# Patient Record
Sex: Female | Born: 1935
Health system: Southern US, Community
[De-identification: ages and names within clinical notes are randomized; demographics above are authoritative.]

## PROBLEM LIST (undated history)

## (undated) DIAGNOSIS — J189 Pneumonia, unspecified organism: Secondary | ICD-10-CM

## (undated) DIAGNOSIS — Z950 Presence of cardiac pacemaker: Secondary | ICD-10-CM

## (undated) DIAGNOSIS — T8859XA Other complications of anesthesia, initial encounter: Secondary | ICD-10-CM

## (undated) DIAGNOSIS — I499 Cardiac arrhythmia, unspecified: Secondary | ICD-10-CM

## (undated) DIAGNOSIS — Z9289 Personal history of other medical treatment: Secondary | ICD-10-CM

## (undated) DIAGNOSIS — Z9889 Other specified postprocedural states: Secondary | ICD-10-CM

## (undated) DIAGNOSIS — C801 Malignant (primary) neoplasm, unspecified: Secondary | ICD-10-CM

## (undated) DIAGNOSIS — Z8719 Personal history of other diseases of the digestive system: Secondary | ICD-10-CM

## (undated) DIAGNOSIS — T4145XA Adverse effect of unspecified anesthetic, initial encounter: Secondary | ICD-10-CM

## (undated) DIAGNOSIS — I4891 Unspecified atrial fibrillation: Secondary | ICD-10-CM

## (undated) DIAGNOSIS — I10 Essential (primary) hypertension: Secondary | ICD-10-CM

## (undated) DIAGNOSIS — H269 Unspecified cataract: Secondary | ICD-10-CM

## (undated) DIAGNOSIS — M1990 Unspecified osteoarthritis, unspecified site: Secondary | ICD-10-CM

## (undated) DIAGNOSIS — R0602 Shortness of breath: Secondary | ICD-10-CM

## (undated) DIAGNOSIS — R001 Bradycardia, unspecified: Secondary | ICD-10-CM

## (undated) DIAGNOSIS — Z87442 Personal history of urinary calculi: Secondary | ICD-10-CM

## (undated) DIAGNOSIS — G479 Sleep disorder, unspecified: Secondary | ICD-10-CM

## (undated) DIAGNOSIS — Z85828 Personal history of other malignant neoplasm of skin: Secondary | ICD-10-CM

## (undated) DIAGNOSIS — R112 Nausea with vomiting, unspecified: Secondary | ICD-10-CM

## (undated) DIAGNOSIS — K219 Gastro-esophageal reflux disease without esophagitis: Secondary | ICD-10-CM

## (undated) DIAGNOSIS — E785 Hyperlipidemia, unspecified: Secondary | ICD-10-CM

## (undated) DIAGNOSIS — F419 Anxiety disorder, unspecified: Secondary | ICD-10-CM

## (undated) HISTORY — PX: POLYPECTOMY: SHX149

## (undated) HISTORY — PX: PACEMAKER INSERTION: SHX728

## (undated) HISTORY — DX: Malignant (primary) neoplasm, unspecified: C80.1

## (undated) HISTORY — PX: BACK SURGERY: SHX140

## (undated) HISTORY — PX: COLONOSCOPY: SHX174

## (undated) HISTORY — PX: CHOLECYSTECTOMY: SHX55

## (undated) HISTORY — DX: Unspecified osteoarthritis, unspecified site: M19.90

## (undated) HISTORY — PX: CATARACT EXTRACTION, BILATERAL: SHX1313

## (undated) HISTORY — DX: Gastro-esophageal reflux disease without esophagitis: K21.9

## (undated) HISTORY — DX: Essential (primary) hypertension: I10

## (undated) HISTORY — DX: Unspecified atrial fibrillation: I48.91

## (undated) HISTORY — PX: UPPER GASTROINTESTINAL ENDOSCOPY: SHX188

## (undated) HISTORY — DX: Pneumonia, unspecified organism: J18.9

## (undated) HISTORY — DX: Hyperlipidemia, unspecified: E78.5

## (undated) HISTORY — DX: Bradycardia, unspecified: R00.1

## (undated) HISTORY — DX: Unspecified cataract: H26.9

---

## 1958-03-29 HISTORY — PX: APPENDECTOMY: SHX54

## 1967-03-30 HISTORY — PX: TOTAL ABDOMINAL HYSTERECTOMY: SHX209

## 1992-03-29 HISTORY — PX: ARTHROPLASTY: SHX135

## 1998-01-28 ENCOUNTER — Ambulatory Visit (HOSPITAL_COMMUNITY): Admission: RE | Admit: 1998-01-28 | Discharge: 1998-01-28 | Payer: Self-pay | Admitting: Gastroenterology

## 1998-03-29 HISTORY — PX: ARTHROPLASTY: SHX135

## 1999-04-06 ENCOUNTER — Encounter: Admission: RE | Admit: 1999-04-06 | Discharge: 1999-04-06 | Payer: Self-pay | Admitting: *Deleted

## 1999-04-06 ENCOUNTER — Encounter: Payer: Self-pay | Admitting: *Deleted

## 1999-06-02 ENCOUNTER — Encounter: Payer: Self-pay | Admitting: *Deleted

## 1999-06-02 ENCOUNTER — Encounter: Admission: RE | Admit: 1999-06-02 | Discharge: 1999-06-02 | Payer: Self-pay | Admitting: *Deleted

## 1999-10-01 ENCOUNTER — Encounter: Payer: Self-pay | Admitting: *Deleted

## 1999-10-01 ENCOUNTER — Encounter: Admission: RE | Admit: 1999-10-01 | Discharge: 1999-10-01 | Payer: Self-pay | Admitting: *Deleted

## 2000-03-29 HISTORY — PX: DG SELECTED HSG GDC ONLY: HXRAD357

## 2000-05-16 ENCOUNTER — Encounter: Payer: Self-pay | Admitting: Orthopedic Surgery

## 2000-05-23 ENCOUNTER — Inpatient Hospital Stay (HOSPITAL_COMMUNITY): Admission: RE | Admit: 2000-05-23 | Discharge: 2000-05-28 | Payer: Self-pay | Admitting: Orthopedic Surgery

## 2000-05-27 ENCOUNTER — Encounter: Payer: Self-pay | Admitting: Orthopedic Surgery

## 2000-10-06 ENCOUNTER — Encounter: Admission: RE | Admit: 2000-10-06 | Discharge: 2000-10-06 | Payer: Self-pay | Admitting: *Deleted

## 2000-10-11 ENCOUNTER — Encounter: Admission: RE | Admit: 2000-10-11 | Discharge: 2000-10-11 | Payer: Self-pay | Admitting: *Deleted

## 2001-01-10 ENCOUNTER — Encounter: Admission: RE | Admit: 2001-01-10 | Discharge: 2001-01-10 | Payer: Self-pay | Admitting: *Deleted

## 2001-03-20 ENCOUNTER — Ambulatory Visit (HOSPITAL_COMMUNITY): Admission: RE | Admit: 2001-03-20 | Discharge: 2001-03-20 | Payer: Self-pay | Admitting: *Deleted

## 2001-10-23 ENCOUNTER — Encounter: Admission: RE | Admit: 2001-10-23 | Discharge: 2001-10-23 | Payer: Self-pay | Admitting: Internal Medicine

## 2001-10-23 ENCOUNTER — Encounter: Payer: Self-pay | Admitting: Internal Medicine

## 2002-09-10 ENCOUNTER — Ambulatory Visit (HOSPITAL_COMMUNITY): Admission: RE | Admit: 2002-09-10 | Discharge: 2002-09-10 | Payer: Self-pay | Admitting: *Deleted

## 2002-10-31 ENCOUNTER — Encounter: Admission: RE | Admit: 2002-10-31 | Discharge: 2002-10-31 | Payer: Self-pay | Admitting: Internal Medicine

## 2002-10-31 ENCOUNTER — Encounter: Payer: Self-pay | Admitting: Internal Medicine

## 2003-11-08 ENCOUNTER — Encounter: Admission: RE | Admit: 2003-11-08 | Discharge: 2003-11-08 | Payer: Self-pay | Admitting: Internal Medicine

## 2004-03-31 ENCOUNTER — Emergency Department (HOSPITAL_COMMUNITY): Admission: EM | Admit: 2004-03-31 | Discharge: 2004-03-31 | Payer: Self-pay | Admitting: Emergency Medicine

## 2004-04-05 ENCOUNTER — Encounter: Admission: RE | Admit: 2004-04-05 | Discharge: 2004-04-05 | Payer: Self-pay | Admitting: Specialist

## 2004-04-23 ENCOUNTER — Encounter: Admission: RE | Admit: 2004-04-23 | Discharge: 2004-04-23 | Payer: Self-pay | Admitting: Specialist

## 2004-05-08 ENCOUNTER — Encounter: Admission: RE | Admit: 2004-05-08 | Discharge: 2004-05-08 | Payer: Self-pay | Admitting: Specialist

## 2004-05-27 HISTORY — PX: OTHER SURGICAL HISTORY: SHX169

## 2004-06-12 ENCOUNTER — Encounter (INDEPENDENT_AMBULATORY_CARE_PROVIDER_SITE_OTHER): Payer: Self-pay | Admitting: *Deleted

## 2004-06-12 ENCOUNTER — Observation Stay (HOSPITAL_COMMUNITY): Admission: RE | Admit: 2004-06-12 | Discharge: 2004-06-13 | Payer: Self-pay | Admitting: Specialist

## 2004-12-14 ENCOUNTER — Encounter: Admission: RE | Admit: 2004-12-14 | Discharge: 2004-12-14 | Payer: Self-pay | Admitting: Internal Medicine

## 2005-07-09 ENCOUNTER — Ambulatory Visit: Payer: Self-pay | Admitting: *Deleted

## 2005-07-09 ENCOUNTER — Encounter: Payer: Self-pay | Admitting: Emergency Medicine

## 2005-07-10 ENCOUNTER — Inpatient Hospital Stay (HOSPITAL_COMMUNITY): Admission: AD | Admit: 2005-07-10 | Discharge: 2005-07-11 | Payer: Self-pay | Admitting: Internal Medicine

## 2005-07-15 ENCOUNTER — Ambulatory Visit: Payer: Self-pay | Admitting: Cardiology

## 2005-07-16 ENCOUNTER — Ambulatory Visit: Payer: Self-pay | Admitting: Internal Medicine

## 2005-07-19 ENCOUNTER — Ambulatory Visit: Payer: Self-pay | Admitting: Internal Medicine

## 2005-07-23 ENCOUNTER — Ambulatory Visit: Payer: Self-pay | Admitting: Cardiology

## 2005-07-27 ENCOUNTER — Ambulatory Visit (HOSPITAL_COMMUNITY): Admission: RE | Admit: 2005-07-27 | Discharge: 2005-07-28 | Payer: Self-pay | Admitting: Internal Medicine

## 2005-07-27 ENCOUNTER — Ambulatory Visit: Payer: Self-pay | Admitting: Internal Medicine

## 2005-08-11 ENCOUNTER — Ambulatory Visit: Payer: Self-pay

## 2005-11-16 ENCOUNTER — Ambulatory Visit: Payer: Self-pay | Admitting: Internal Medicine

## 2005-12-15 ENCOUNTER — Encounter: Admission: RE | Admit: 2005-12-15 | Discharge: 2005-12-15 | Payer: Self-pay | Admitting: Internal Medicine

## 2006-03-23 ENCOUNTER — Inpatient Hospital Stay (HOSPITAL_COMMUNITY): Admission: EM | Admit: 2006-03-23 | Discharge: 2006-03-28 | Payer: Self-pay | Admitting: Emergency Medicine

## 2006-03-25 ENCOUNTER — Encounter (INDEPENDENT_AMBULATORY_CARE_PROVIDER_SITE_OTHER): Payer: Self-pay | Admitting: Specialist

## 2006-08-16 ENCOUNTER — Ambulatory Visit: Payer: Self-pay | Admitting: Internal Medicine

## 2006-09-26 ENCOUNTER — Ambulatory Visit: Payer: Self-pay | Admitting: Internal Medicine

## 2006-11-21 ENCOUNTER — Ambulatory Visit: Payer: Self-pay | Admitting: Internal Medicine

## 2006-12-20 ENCOUNTER — Encounter: Admission: RE | Admit: 2006-12-20 | Discharge: 2006-12-20 | Payer: Self-pay | Admitting: Internal Medicine

## 2007-01-16 ENCOUNTER — Ambulatory Visit: Payer: Self-pay | Admitting: Internal Medicine

## 2007-02-10 ENCOUNTER — Encounter: Admission: RE | Admit: 2007-02-10 | Discharge: 2007-02-10 | Payer: Self-pay | Admitting: Internal Medicine

## 2007-03-13 ENCOUNTER — Ambulatory Visit: Payer: Self-pay | Admitting: Internal Medicine

## 2007-04-13 ENCOUNTER — Ambulatory Visit: Payer: Self-pay | Admitting: Internal Medicine

## 2007-07-13 ENCOUNTER — Ambulatory Visit: Payer: Self-pay | Admitting: Internal Medicine

## 2007-08-28 ENCOUNTER — Ambulatory Visit: Payer: Self-pay | Admitting: Internal Medicine

## 2007-10-12 ENCOUNTER — Ambulatory Visit: Payer: Self-pay | Admitting: Internal Medicine

## 2007-10-18 ENCOUNTER — Encounter: Admission: RE | Admit: 2007-10-18 | Discharge: 2007-10-18 | Payer: Self-pay | Admitting: Internal Medicine

## 2007-12-28 ENCOUNTER — Encounter: Admission: RE | Admit: 2007-12-28 | Discharge: 2007-12-28 | Payer: Self-pay | Admitting: Internal Medicine

## 2008-01-11 ENCOUNTER — Ambulatory Visit: Payer: Self-pay | Admitting: Internal Medicine

## 2008-04-11 ENCOUNTER — Ambulatory Visit: Payer: Self-pay | Admitting: Internal Medicine

## 2008-07-11 ENCOUNTER — Ambulatory Visit: Payer: Self-pay | Admitting: Internal Medicine

## 2008-07-19 ENCOUNTER — Encounter (INDEPENDENT_AMBULATORY_CARE_PROVIDER_SITE_OTHER): Payer: Self-pay | Admitting: Radiology

## 2008-08-21 DIAGNOSIS — M1990 Unspecified osteoarthritis, unspecified site: Secondary | ICD-10-CM | POA: Insufficient documentation

## 2008-08-21 DIAGNOSIS — R001 Bradycardia, unspecified: Secondary | ICD-10-CM

## 2008-08-21 DIAGNOSIS — K219 Gastro-esophageal reflux disease without esophagitis: Secondary | ICD-10-CM

## 2008-08-21 DIAGNOSIS — I10 Essential (primary) hypertension: Secondary | ICD-10-CM

## 2008-08-21 DIAGNOSIS — J189 Pneumonia, unspecified organism: Secondary | ICD-10-CM

## 2008-08-21 DIAGNOSIS — I4891 Unspecified atrial fibrillation: Secondary | ICD-10-CM

## 2008-08-21 DIAGNOSIS — N200 Calculus of kidney: Secondary | ICD-10-CM

## 2008-08-29 ENCOUNTER — Ambulatory Visit: Payer: Self-pay | Admitting: Internal Medicine

## 2008-08-29 DIAGNOSIS — Z950 Presence of cardiac pacemaker: Secondary | ICD-10-CM

## 2008-10-17 ENCOUNTER — Ambulatory Visit: Payer: Self-pay | Admitting: Internal Medicine

## 2008-12-31 ENCOUNTER — Encounter: Admission: RE | Admit: 2008-12-31 | Discharge: 2008-12-31 | Payer: Self-pay | Admitting: Internal Medicine

## 2009-01-16 ENCOUNTER — Ambulatory Visit: Payer: Self-pay | Admitting: Internal Medicine

## 2009-04-17 ENCOUNTER — Ambulatory Visit: Payer: Self-pay | Admitting: Internal Medicine

## 2009-07-17 ENCOUNTER — Ambulatory Visit: Payer: Self-pay | Admitting: Internal Medicine

## 2009-08-27 ENCOUNTER — Ambulatory Visit: Payer: Self-pay | Admitting: Internal Medicine

## 2009-08-28 ENCOUNTER — Encounter: Payer: Self-pay | Admitting: Internal Medicine

## 2009-10-16 ENCOUNTER — Encounter: Payer: Self-pay | Admitting: Internal Medicine

## 2009-10-24 ENCOUNTER — Ambulatory Visit: Payer: Self-pay | Admitting: Internal Medicine

## 2010-01-15 ENCOUNTER — Ambulatory Visit: Payer: Self-pay | Admitting: Internal Medicine

## 2010-04-16 ENCOUNTER — Encounter: Payer: Self-pay | Admitting: Internal Medicine

## 2010-04-20 ENCOUNTER — Encounter
Admission: RE | Admit: 2010-04-20 | Discharge: 2010-04-20 | Payer: Self-pay | Source: Home / Self Care | Attending: Internal Medicine | Admitting: Internal Medicine

## 2010-04-23 ENCOUNTER — Ambulatory Visit: Payer: Self-pay | Admitting: Internal Medicine

## 2010-04-28 NOTE — Cardiovascular Report (Signed)
Summary: TTM   TTM   Imported By: Roderic Ovens 10/28/2009 14:35:19  _____________________________________________________________________  External Attachment:    Type:   Image     Comment:   External Document

## 2010-04-28 NOTE — Cardiovascular Report (Signed)
Summary: TTM   TTM   Imported By: Roderic Ovens 10/28/2009 14:34:37  _____________________________________________________________________  External Attachment:    Type:   Image     Comment:   External Document

## 2010-04-28 NOTE — Cardiovascular Report (Signed)
Summary: TTM   TTM   Imported By: Roderic Ovens 08/21/2009 11:39:08  _____________________________________________________________________  External Attachment:    Type:   Image     Comment:   External Document

## 2010-04-28 NOTE — Assessment & Plan Note (Signed)
Summary: 1 year rov   Visit Type:  Follow-up Primary Provider:  Dr Wylene Simmer  CC:  pt stumbled  and fell while she was at [employer] otherwise she is doing ok.  History of Present Illness: Ms. Rentz returns today for followup.  She is an elderly woman with a h/o atrial fibrillation and symptomatic bradycardia who is s/p PPM.  She has done well.  She denies c/p or sob.  No peripheral edema.  She does admit to falling several weeks ago.  She states that she tripped and her face struck the concrete.  She admits to being sedentary.  No other complaints at this time.  Current Medications (verified): 1)  Micardis 80 Mg Tabs (Telmisartan) .... Take One Tablet By Mouth Daily 2)  Coumadin 2 Mg Tabs (Warfarin Sodium) .... As Directed 3)  Potassium Chloride Crys Cr 20 Meq Cr-Tabs (Potassium Chloride Crys Cr) .... Take One Tablet By Mouth Daily 4)  Amlodipine Besylate 5 Mg Tabs (Amlodipine Besylate) .... Take One Tablet By Mouth Daily 5)  Pravastatin Sodium 40 Mg Tabs (Pravastatin Sodium) .... Take One Tablet By Mouth Daily At Bedtime 6)  Hydrochlorothiazide 25 Mg Tabs (Hydrochlorothiazide) .... Take One Tablet By Mouth Daily.  Allergies (verified): No Known Drug Allergies  Past History:  Past Medical History: Last updated: 08/21/2008 GERD (ICD-530.81) NEPHROLITHIASIS (ICD-592.0) PNEUMONIA (ICD-486) OSTEOARTHRITIS (ICD-715.90) HYPERTENSION (ICD-401.9) AUTOMATIC IMPLANTABLE CARDIAC DEFIBRILLATOR SITU (ICD-V45.02) ATRIAL FIBRILLATION (ICD-427.31) BRADYCARDIA (ICD-427.89)  Past Surgical History: Last updated: 08/21/2008  March of 2006, she had a left L4-L5 microdiskectomy.  Left total knee arthroplasty in 1994 and a right total knee arthroplasty  in about the year 2000. GD in 2002 with a dilation  Appendectomy in 1960. Total abdominal hysterectomy in 1969.  Review of Systems  The patient denies chest pain, syncope, dyspnea on exertion, and peripheral edema.    Vital  Signs:  Patient profile:   77 year old female Height:      64 inches Weight:      157 pounds BMI:     27.05 Pulse rate:   61 / minute BP sitting:   130 / 76  (left arm) Cuff size:   regular  Vitals Entered By: Burnett Kanaris, CNA (August 27, 2009 11:44 AM)  Physical Exam  General:  Well developed, well nourished, in no acute distress. Head:  normocephalic and atraumatic Eyes:  PERRLA/EOM intact; conjunctiva and lids normal. Mouth:  Teeth, gums and palate normal. Oral mucosa normal. Neck:  Neck supple, no JVD. No masses, thyromegaly or abnormal cervical nodes. Chest Wall:  Well healed PPM incisio. Lungs:  Clear bilaterally to auscultation with no wheezes or rhonchi. Heart:  IRIR with normal S1 and S2.  PMI is not enlarged or laterally displaced. Abdomen:  Bowel sounds positive; abdomen soft and non-tender without masses, organomegaly, or hernias noted. No hepatosplenomegaly. Msk:  Back normal, normal gait. Muscle strength and tone normal. Pulses:  pulses normal in all 4 extremities Extremities:  No clubbing or cyanosis. Neurologic:  Alert and oriented x 3.   PPM Specifications Following MD:  Lewayne Bunting, MD     PPM Vendor:  [employer] Jude     PPM Model Number:  [phone] [id number]     PPM Serial Number:  [id number] PPM DOI:  07/27/2005     PPM Implanting MD:  Lewayne Bunting, MD  Lead 1    Location: RV     DOI: 07/27/2005     Model #: [id number]     Serial #:  [id number]     Status: active   Indications:  T/B SYNDROME   PPM Follow Up Remote Check?  No Battery Voltage:  2.79 V     Battery Est. Longevity:  10 YEARS     Pacer Dependent:  No     Right Ventricle  Amplitude: 16.6 mV, Impedance: 384 ohms, Threshold: 0.875 V at 0.4 msec  Episodes Ventricular High Rate:  0     Ventricular Pacing:  62%  Parameters Mode:  VVIR     Lower Rate Limit:  60     Upper Rate Limit:  120 Next Cardiology Appt Due:  08/28/2010 Tech Comments:  Normal device function.  Rate response turned on today because of increased amount of  ventricular pacing.  No other changes made.  ROV 12 months GT. Gypsy Balsam RN BSN  August 27, 2009 12:11 PM   Impression & Recommendations:  Problem # 1:  CARDIAC PACEMAKER IN SITU (ICD-V45.01) Her PPM is working normally today.  She will followup in several months.  Problem # 2:  HYPERTENSION (ICD-401.9) She will continue her current meds and maintain a low sodium diet. Her updated medication list for this problem includes:    Micardis 80 Mg Tabs (Telmisartan) .Marland Kitchen... Take one tablet by mouth daily    Amlodipine Besylate 5 Mg Tabs (Amlodipine besylate) .Marland Kitchen... Take one tablet by mouth daily    Hydrochlorothiazide 25 Mg Tabs (Hydrochlorothiazide) .Marland Kitchen... Take one tablet by mouth daily.  Problem # 3:  ATRIAL FIBRILLATION (ICD-427.31) Her ventricular rate is well controlled.  She will continue coumadin. Her updated medication list for this problem includes:    Coumadin 2 Mg Tabs (Warfarin sodium) .Marland Kitchen... As directed  Patient Instructions: 1)  Your physician wants you to follow-up in: 12 months with Dr Ladona Ridgel.  You will receive a reminder letter in the mail two months in advance. If you don't receive a letter, please call our office to schedule the follow-up appointment.

## 2010-04-28 NOTE — Cardiovascular Report (Signed)
Summary: TTM   TTM   Imported By: Roderic Ovens 01/27/2010 15:20:02  _____________________________________________________________________  External Attachment:    Type:   Image     Comment:   External Document

## 2010-04-28 NOTE — Cardiovascular Report (Signed)
Summary: TTM   TTM   Imported By: Roderic Ovens 05/26/2009 14:06:43  _____________________________________________________________________  External Attachment:    Type:   Image     Comment:   External Document

## 2010-05-06 NOTE — Cardiovascular Report (Signed)
Summary: TTM   TTM   Imported By: Roderic Ovens 04/28/2010 12:01:17  _____________________________________________________________________  External Attachment:    Type:   Image     Comment:   External Document

## 2010-05-26 ENCOUNTER — Other Ambulatory Visit: Payer: Self-pay | Admitting: Dermatology

## 2010-07-16 ENCOUNTER — Encounter: Payer: Self-pay | Admitting: Internal Medicine

## 2010-07-16 DIAGNOSIS — I495 Sick sinus syndrome: Secondary | ICD-10-CM

## 2010-08-11 NOTE — Assessment & Plan Note (Signed)
[facility]                         ELECTROPHYSIOLOGY OFFICE NOTE   NAME:[name], TERRION POBLANO                    MRN:          [id number]  DATE:08/28/2007                            DOB:          1926/01/08    Ms. Alicia returns today for follow-up.  She is a very pleasant, 77-year-  old woman with a history of atrial fibrillation and bradycardia,  hypertension and dyslipidemia who returns today for follow-up.  She  denies chest pain or shortness of breath.  She has been under some  increased stress because her husband has fairly advanced Alzheimer's  dementia.   Her medications include:  1. Coumadin as directed.  2. Hydrochlorothiazide 25 a day.  3. Potassium 20 a day.  4. Norvasc two tablets twice daily.  5. Clonidine 0.1 daily.  6. Pravachol 40 twice daily.   EXAMINATION:  She is a pleasant, well-appearing, elderly woman in no  distress.  Blood pressure 147/73, the pulse 60 and regular, the  respirations are 18, the weight was 152 pounds.  NECK:  Revealed no jugular distention.  LUNGS:  Clear bilaterally to auscultation.  No wheezes, rales or rhonchi  are present.  CARDIOVASCULAR EXAM:  Revealed an irregular regular rhythm with normal  S1 and S2.  EXTREMITIES:  Demonstrated trace peripheral edema bilaterally.   Interrogation of her pacemaker demonstrates a Teacher, early years/pre.  R waves  of 16, the impedance 500, the threshold 1.5 at 0.4.  Battery voltage was  2.79 volts.  She was 50% V-paced.   IMPRESSION:  1. Symptomatic bradycardia.  2. Chronic atrial fibrillation.  3. Status post pacemaker insertion.  4. Hypertension.   DISCUSSION:  Ms. Delavega is stable.  Her pacemaker is working normally.  We will see her back in the office with pacemaker follow-up in 1 year.     Doylene Canning. Ladona Ridgel, MD  Electronically Signed    GWT/MedQ  DD: 08/28/2007  DT: 08/28/2007  Job #: [id number]

## 2010-08-11 NOTE — Assessment & Plan Note (Signed)
[facility]                         ELECTROPHYSIOLOGY OFFICE NOTE   NAME:[name], [name]                    MRN:          [id number]  DATE:08/16/2006                            DOB:          [birth year]    [name] returns today for follow up.  She is a very pleasant elderly  woman with chronic atrial fibrillation and bradycardia.  She is status  post pacemaker insertion.  She also has a history of hypertension.  She  denies chest pain or shortness of breath today.  She is overall feeling  quite well.  She denies palpitations or fever.   PHYSICAL EXAMINATION:  GENERAL:  She is a pleasant, well-appearing woman  in no acute distress.  VITAL SIGNS:  Blood pressure today was 151/81, pulse 75 and irregular,  respirations 18, weight was 150 pounds.  NECK:  No jugular venous distention.  LUNGS:  Clear bilaterally to auscultation without wheezes, rales or  rhonchi.  CARDIOVASCULAR:  Irregular rate and rhythm with normal S1 and S2.  EXTREMITIES:  No edema.   MEDICATIONS:  1. Coumadin as directed.  2. HCTZ.  3. Potassium supplement.  4. Lyrica.  5. Norvasc 5 a day.  6. Micardis 80 mg daily.  7. Clonidine.  8. Pravastatin.   Interrogation of her pacemaker demonstrates a [employer]. Jude Verity with R  waves greater than 16.  The impedance was 479 ohms, threshold 1.25 volts  at 0.4 ms.  The battery voltage was 2.79 volts.  She was 50% V paced.   IMPRESSION:  1. Symptomatic bradycardia.  2. Chronic atrial fibrillation.  3. Status post pacemaker insertion.  4. Hypertension.   DISCUSSION:  Overall, [name] is stable and her pacemaker is working  normally.  Will plan to see her back in the office in 1 year for  pacemaker follow up.     Doylene Canning. Ladona Ridgel, MD  Electronically Signed    GWT/MedQ  DD: 08/16/2006  DT: 08/16/2006  Job #: [id number]   cc:   Gaspar Garbe, M.D.

## 2010-08-14 NOTE — Op Note (Signed)
[facility]  Patient:    [name], [name]              MRN: [id number] Proc. Date: 05/23/00 Adm. Date:  [id number] Attending:  Marlowe Kays Page                           Operative Report  PREOPERATIVE DIAGNOSIS:  Osteoarthritis right knee.  POSTOPERATIVE DIAGNOSIS:  Osteoarthritis right knee.  OPERATIVE PROCEDURE:  Osteonics total knee replacement right.  SURGEON:  Illene Labrador. [place], M.D.  ASSISTANT: Georges Lynch. Darrelyn Hillock, M.D.  ANESTHESIA:  Spinal.  INDICATIONS/INTRAOPERATIVE FINDINGS: :  She has had a successful total knee replacement in 1994 on the left and has bone-on-bone abutment in the medial compartment of the knee joint in her right knee with slight flexion contracture. At surgery, she had complete wear through the articular cartilage of the medial femoral condyle. I elected to use the cruciate retaining Scorpio model gluing in all three components.  DESCRIPTION OF PROCEDURE:  Prophylactic antibiotics, satisfactory spinal anesthesia, pneumatic tourniquet, Sur-Fit lateral hip stabilizer. The right leg was prepped with DuraPrep from tourniquet to ankle and draped as a sterile field. Ioban employed. Vertical midline incision down to the patellar mechanism. The median parapatellar incision opened the joint. The pes anserinus and mediocollateral ligament were undermined at the medial tibia and the patella everted and the knee flexed. Osteophytes were removed from around the femur and the patella. The anterior portions of both menisci and ACL were sacrificed. I then made a 5/16 inch drill hole in the distal femur followed by the canal finder and the axis line was set at 5 degrees for the right knee. I elected to make a 12 mm distal femoral cut because of the flexion contracture and after making this cut, we then went ahead and sized the distal femur with a sizing jig at a #7. The scribe lines were placed for the distal femoral cutting  jig which was then placed and anterior and posterior cuts and posterior and anterior chamferings were then made. I then returned to the tibia where the remnants of the menisci were removed and a leveling cut was made and the tibia sized at #7, as well. The initial intramedullary drill hole followed by the step-cut drill and canal finder were placed. I then placed the intramedullary guide with the extramedullary cutting jig and extramedullary rod and lined up and splitting the bimalleolar distance. A 4 mm cut was selected based on the pressed mediotibial plateau and a 5 degree cut posteriorly made. I then placed the lamina spreader and removed remnants of bone and soft tissue from behind the femoral condyles. The femoral cutting jig for the patellar groove was then placed and the groove made. I then placed a trial #7 prosthesis which fit intimately on the femur and then used the #7 tibial tray with a 10 mm spacer which seemed to be an ideal fit with minimal recurvatum and good medial and lateral stability. Accordingly, I then addressed the patella which I sized at a #26 and I used the 10 mm recess cutting jig to make the 10 mm recess cut and then placed the guide for drilling the three patellar fixation holes, which were made. I then placed the trial patella and trimmed excess bone from around the patella. Next, the tripod apparatus was placed for reaming of the tibial keel up to a #7 cemented. The knee was then water picked while the methylmethacrylate  was being mixed. The methacrylate was then applied with a glue gun first to the tibia where the component was placed and impacted, and excess methylmethacrylate being removed. The tibial tray position was determined when I checked the alignment and space with the trials using, again, an extramedullary rod splitting the bimalleolar distance. I then glued in the femur in the same fashion with excess methylmethacrylate being removed. Gelfoam was  placed in the intramedullary hole. With the knee held in extension with an 8 mm spacer, I then glued in the patella which I held with a patellar holding clamp with excess methylmethacrylate being removed once again. When the glue had hardened, I trimmed off small amounts of methacrylate from around the patella and remnants from the joint. I then went through a trial with a 10 mm spacer which once again, seemed to be the ideal fit. Slight lateral release was required and was performed. I then went ahead and irrigated the wound well and made sure that the tray was cleared of all particulate and soft tissue matter and placed the final 10 mm spacer which was impacted and the knee checked for range of motion and stability, which was excellent. A Hemovac was then placed and the wound closed with interrupted #1 Vicryl in two layers in the quadriceps and two layers distally with one in the synovium and one in the capsule. The subcutaneous tissue was closed with a combination of #1, #0 and 2-0 Vicryl and the skin with staples. Dry, sterile dressings were applied. The tourniquet was released. She tolerated the procedure well and was taken to the recovery room in satisfactory condition with no known complications, no blood loss, no blood replacement. DD:  05/23/00 TD:  05/24/00 Job: [id number]

## 2010-08-14 NOTE — Discharge Summary (Signed)
[facility]  Patient:    [name], [name]              MRN: [id number] Adm. Date:  [id number] Disc. Date: [id number] Attending:  Marlowe Kays Page Dictator:   Ralene Bathe, P.A.-C.                           Discharge Summary  ADMISSION DIAGNOSES:  1. End-stage osteoarthritis, right knee.  2. Hypertension.  3. Osteoarthritis, generalized.  4. History of renal calculi.  5. History of pneumonia, December 2000.  6. History of reflux.  DISCHARGE DIAGNOSES:  1. End-stage osteoarthritis, right knee.  2. Hypertension.  3. Osteoarthritis, generalized.  4. History of renal calculi.  5. History of pneumonia, December 2000.  6. History of reflux.  7. Status post right total knee arthroplasty.  8. Postoperative hemorrhagic anemia requiring transfusion.  9. Postoperative hypokalemia, resolved. 10. Postoperative hyponatremia, resolved. 11. Mild postoperative fluid overload, treated with mild diuresis.  OPERATIONS:  Right total knee arthroplasty.  Surgeon - Marlowe Kays, M.D., assistant - Ranee Gosselin, M.D., under spinal anesthesia.  HISTORY OF PRESENT ILLNESS:  [name] is a 77 year old female followed in our practice for some time.  She had had a previous left total knee arthroplasty by Dr. Fraser Din some years ago and has done well.  Her right knee has progressed with severe end-stage osteoarthritis and is now to the point where she wishes to pursue total knee arthroplasty.  Risks and benefits were discussed with patient.  She was seen by her medical physician, Dr. Darl Pikes ______, and cleared preoperatively.  At this time, all arrangements were made for total knee arthroplasty.  HOSPITAL COURSE:  Patient was admitted and underwent the above-named procedure and tolerated this well.  All appropriate IV antibiotics and analgesics were provided.  Postoperatively, she was placed on Coumadin for DVT and PE prophylaxis.  She was also placed  on partial weightbearing to the operative extremity and total knee replacement protocol.  Postoperatively, patient did experience a blood loss, postoperative anemia with her hemoglobin dropping to 7.8 and symptomatic.  She was transfused two units of packed cells with good result.  She did have episode of bilateral lower lung field crackles and wheezes and was noted to be in mild fluid overload of about 1200.  She responded well to gentle diuresis.  She did experience postoperative hyponatremia and hypokalemia probably from fluid overload.  She responded well to the potassium supplementation and fluid restriction.  Patient did have to be continued on potassium supplements in regards to her hypokalemia.  She was discharged on potassium supplements and urged to follow up with her primary physician.  On May 28, 2000, postoperative day #5, patient was doing extremely well.  She had reached all of her therapy goals.  She was voiding and had a bowel movement at this time.  She was afebrile.  Incision in the knee looked great with minimal swelling.  She was neurovascularly intact. Potassium had improved to 3.7.  It had been at a low of 2.2 postoperatively. At this time, patient was felt medically and orthopedically stable for discharge to home.  Arrangements were made for home health physical therapy and she was discharged accordingly.  LABORATORY VALUES:  Postoperative hemoglobin 9.0, 7.8.  After transfusion of two units, 9.5, 10.7, and 9.7.  Pro times, INRs followed by pharmacy on Coumadin for DVT and PE prophylaxis.  Postoperative chemistries noted in the chart with multiple  draws.  She was mildly hyponatremic at 133, 130, and 131, with good improvement by discharge to normal at 135.  Potassium was preoperatively 3.7 and, then 2.2, postoperatively 2.4, 2.5, and then resolved at 3.7 prior to discharge.  Calcium was borderline low at 7.7 and 8.1 to a normal of 8.6.  Glucoses ranged from 99 to  136.  Urinalysis on admission within normal limits.  Blood type shows A positive.  Two units found from transfusion in chart.  X-ray shows degenerative changes of the knee on the right preoperatively.  A chest x-ray on February 18 showed atelectasis of left lung base, mildly prominent interstitial markings on date February 18 and, on March 1, lateral lower lobe atelectasis.  I do not see EKG at time of dictation.  CONDITION ON DISCHARGE:  Stable and improved.  DISCHARGE MEDICATIONS AND PLANS:  Patient will be discharged to home with home health physical therapy and registered nurse for blood draws.  I have ordered a BMET to be drawn with her first prothrombin time draw and fax results to our office, [phone].  She is to call her medical physician, Dr. ______, for further orders in regards to her hypokalemia and potassium supplements. Prescriptions were given for Percocet 5 mg, #40, Robaxin 5 mg, #30, K-Dur 20 mg, #30, one b.i.d. for five days, then one q.d., and Coumadin per protocol.  Follow up in two weeks, call for a time.  May shower at this time. Resume her home medications and home diet. DD:  06/29/00 TD:  06/29/00 Job: [id number]

## 2010-08-14 NOTE — H&P (Signed)
[name], [name] NO.:  [id number]   MEDICAL RECORD NO.:  [id number]          PATIENT TYPE:  EMS   LOCATION:  MAJO                         FACILITY:  [facility]   PHYSICIAN:  Geoffry Paradise, M.D.  DATE OF BIRTH:  1925-12-26   DATE OF ADMISSION:  03/23/2006  DATE OF DISCHARGE:                              HISTORY & PHYSICAL   CHIEF COMPLAINT:  Abdominal pain, nausea, and vomiting.   HISTORY OF PRESENT ILLNESS:  [name] is a very pleasant 77 year old  with hypertension, hyperlipidemia, atrial fibrillation and pacemaker,  gastroesophageal reflux disease, and osteoarthritis, presenting at this  time with abdominal pain, nausea, and vomiting since approximately 11  p.m. last evening.  Her cardiac history is remarkable for atrial  fibrillation with slow ventricular response, necessitating a pacemaker,  on chronic Coumadin, well managed.  She has had an negative ischemic  workup as of April 2007, with an adenosine study done inpatient as a  result of the chest pain admission, and this was without evidence of  ischemic disease.  She has had no chest pain or shortness of breath  since that admission and remains fairly active.  She was awakened at  approximately 11:00 to 12:00 with right upper quadrant pain, nausea, and  vomiting which persisted, resulting in the emergency room presentation.  Findings were felt at that time radiographically to be compatible with  cholelithiasis but no evidence of frank cholecystitis at this time.  She  has had no fever or diarrhea.  She has had no chest pain or shortness of  breath.  Denies any headache, dizziness, or head trauma.  She is  admitted at this time for hydration, empiric antibiotics, and timing of  surgical procedure with anticoagulation bridge.   PAST MEDICAL HISTORY:  NO KNOWN DRUG ALLERGIES.   MEDICATIONS:  1. Atacand 32 mg daily.  2. Clonidine 0.1 b.i.d.  3. Hydrochlorothiazide 25 mg daily.  4. Recent start of, I  believe, simvastatin for cholesterol, although      she is not clear on this and only knows to color of the tablet.   MEDICAL ILLNESSES:  Medical illnesses include hyperlipidemia, essential  hypertension, chronic atrial fibrillation with slow ventricular response  necessitating pacemaker, osteoarthritis, gastroesophageal reflux  disease, and degenerative disk disease.   SOCIAL HISTORY:  The patient is a nonsmoker.  No significant alcohol.   PHYSICAL EXAMINATION:  VITAL SIGNS:  Temperature 97.4, blood pressure  140/76, pulse 64 paced, respiratory rate is 18 unlabored, O2 saturation  97%.  GENERAL:  The patient looks well, bright, alert, conversant, in no  distress.  SKIN:  Skin is warm, non diaphoretic, good turgor.  HEENT: Anicteric.  Fundi benign.  Oropharynx moist mucosa, no lesions.  NECK:  No JVD or bruits.  LUNGS:  Lungs are clear to auscultation percussion.  CARDIOVASCULAR EXAMINATION:  Paced, regular at this time.  No murmur,  gallop, rub, or heave.  Pacemaker pocket intact, no warmth or redness  BACK:  No CVA or spinal tenderness.  ABDOMEN:  Abdomen is actually soft, good bowel sounds, nontender.  The  patient has received Dilaudid at this  time.  No bruits.  EXTREMITIES:  No cyanosis, clubbing, or edema.  Joints normal.  Warm  distally.  Intact pulses.  NEUROLOGIC EXAMINATION:  Non lateralizing.   ASSESSMENT:  1. Cholelithiasis with pain, nausea, and vomiting, suspicious for      evolving cholecystitis, clearly nontoxic at this time.  2. Essential hypertension.  3. Atrial fibrillation with pacemaker and negative ischemic cardiac      workup 6 months ago, with no current ischemic symptoms.  4. Osteoarthritis and degenerative disk disease.   PLAN:  The patient is admitted, hydrated, empiric antibiotics, [facility] is on board awaiting timing of surgery, with Coumadin  and Lovenox bridge.  This has been discussed with the patient and  family.            ______________________________  Geoffry Paradise, M.D.     RA/MEDQ  D:  03/23/2006  T:  03/23/2006  Job:  [id number]   cc:   [facility]

## 2010-08-14 NOTE — Consult Note (Signed)
[name], [name]             ACCOUNT NO.:  [id number]   MEDICAL RECORD NO.:  [id number]          PATIENT TYPE:  EMS   LOCATION:  MAJO                         FACILITY:  [facility]   PHYSICIAN:  Adolph Pollack, M.D.DATE OF BIRTH:  06-08-1925   DATE OF CONSULTATION:  03/23/2006  DATE OF DISCHARGE:                                 CONSULTATION   CHIEF COMPLAINT:  Right upper quadrant pain, nausea, and vomiting.   HISTORY OF PRESENT ILLNESS:  This is a 77 year old female who had [place]  and dressing last night for supper.  She subsequently awoke at 11  o'clock with a pressure-type right upper quadrant pain.  It became  progressively worse, and it was associated with nausea and vomiting.  She then presented to the emergency department at approximately 2:40  this morning for evaluation.  Her evaluation was notable for an  ultrasound that demonstrated gallstones without gallbladder wall  thickening, but the stones were in the neck of the gallbladder.  She had  mild elevation of her SGOT and a normal white cell count with a leftward  shift.  She was given some IV pain medicine; however, she was not  completely pain free, and because of that, I was asked to see her.  She  denies jaundice.  No previous pain like this.   PAST MEDICAL HISTORY:  1. Hypertension.  2. Chronic atrial fibrillation with slow ventricular response      requiring pacemaker insertion.  3. Gastroesophageal reflux.  4. Degenerative joint disease.  5. Osteoarthritis.  6. Nephrolithiasis.   PREVIOUS OPERATIONS:  1. Appendectomy.  2. Pacemaker insertion.   ALLERGIES:  MORPHINE.   MEDICATIONS:  Lipid-lowering agent, the name of which she does not know;  Coumadin; Atacand, Catapres, and hydrochlorothiazide.   SOCIAL HISTORY:  She is independent.  No tobacco or alcohol use.   FAMILY HISTORY:  Noncontributory to this case.   REVIEW OF SYSTEMS:  CARDIOVASCULAR:  She had some atypical chest pain in  the past, but  it was not felt to be cardiac in origin.  In fact, she had  an adenosine Cardiolite with an 81% ejection fraction and normal wall  motion without evidence of ischemia back in April of this year.  PULMONARY:  She states she has had a little pneumonia in the past.  No  COPD.  No asthma.  GI:  She denies hepatitis.  She states she has been  told she had some diverticular disease and may have had a little  bleeding from that in the past.  GU:  Kidney stones as above.  No  dysuria or hematuria.  ENDOCRINE:  no diabetes or thyroid disease.  NEUROLOGIC:  Denies strokes or seizures.  HEMATOLOGIC:  Denies any  bleeding disorders or blood clots.   PHYSICAL EXAM:  GENERAL:  A very pleasant elderly female in no acute  distress.  VITAL SIGNS:  Temperature is 96.9, blood pressure is 140/72, and pulse  64.  EYES:  Extraocular motions intact.  No icterus.  NECK:  Supple without masses or obvious thyroid enlargement.  RESPIRATORY:  The breath sounds are equal and  clear.  Respirations are  unlabored.  CHEST:  Left upper wall incision and pacemaker noted.  CARDIOVASCULAR:  Regular rate.  Regular rhythm.  No murmur.  No JVD.  ABDOMEN:  Soft.  There is a right lower quadrant scar present.  There is  some mild to moderate right lower quadrant tenderness without any  guarding.  Intermittent bowel sounds are heard.  No obvious masses.  BACK:  No CVA tenderness.  EXTREMITIES:  Good range of motion.  Good muscle tone.  SKIN:  No jaundice.   LABORATORY DATA:  Notable for white cell blood count of 8900 with a left  shift.  Hemoglobin 11.9.  SGOT 45.  Other LFTs normal.  Lipase normal.  INR 3.0.   Ultrasound was reviewed, and this demonstrates cholelithiasis with  stones in the gallbladder neck, but there is no wall thickening.   IMPRESSION:  Symptomatic cholelithiasis with a high likelihood of  evolving acute cholecystitis.  Other problems include chronic atrial  fibrillation with chronic anticoagulation.   Has hypertension as well.   PLAN:  She is being admitted by the medical service.  They are reversing  her anticoagulation and bridging her with Lovenox.  Empiric antibiotics  have been started.  I discussed with her a laparoscopic cholecystectomy  once her INR is normalized.  We went over the procedure and the risks.  Risks include but are not limited to bleeding, infection, wound healing  problems, anesthesia, accidental injury to the common bile  duct/liver/intestines, and bile leak.  She seems to understand this and  is agreeable to proceeding.  We will be checking daily INRs on her.  She  is going to get some vitamin K today.      Adolph Pollack, M.D.  Electronically Signed     TJR/MEDQ  D:  03/23/2006  T:  03/23/2006  Job:  [id number]   cc:   Gaspar Garbe, M.D.

## 2010-08-14 NOTE — H&P (Signed)
[name], [name]             ACCOUNT NO.:  [id number]   MEDICAL RECORD NO.:  [id number]          PATIENT TYPE:  EMS   LOCATION:  ED                           FACILITY:  [facility]   PHYSICIAN:  Mark A. Perini, M.D.   DATE OF BIRTH:  [birth year]   DATE OF ADMISSION:  07/09/2005  DATE OF DISCHARGE:                                HISTORY & PHYSICAL   CHIEF COMPLAINT:  Chest pain, left arm pain, shortness of breath, dyspnea on  exertion.   HISTORY OF PRESENT ILLNESS:  [name] is a 77 year old female with no  coronary disease history; however, she does have a history of hypertension,  chronic atrial fibrillation and remote tobacco use.  She was noted during  one of her routine Coumadin checks recently to have a heart rate in the 40s.  Apparently at that time, Dr. Wylene Simmer stopped her beta blocker and was in the  process of getting her evaluated by Carolanne Grumbling, M.D. for possible need  for pacemaker placement.  She was advised to take Sudafed for 3 days last  week to help increase her pulse rate.  She did this, and this made her feel  somewhat nervous.  Today, she was noted to have some shortness of breath and  dyspnea on exertion.  She also woke up with left upper extremity pain  involving her entire left upper extremity.  Her left arm is not tender.  She  has no known orthopedic injury.  She has also had some vague chest symptoms  which feels like she has a burp in her chest.  Burping does not completely  relieve it.  The arm pain and chest symptom is not definitively related to  exertion or rest.  There are no clear exacerbating or alleviating factors.  Given these symptoms, she was brought by EMS to the emergency room.  In the  emergency room, the EKG shows atrial fibrillation with some poor R wave  progression, but no definite acute EKG changes.  Her 2 sets of enzymes in  the ER have been negative.  However, I feel she requires admission to rule  out underlying coronary disease.   PAST MEDICAL HISTORY:  1.  Atrial fibrillation diagnosed sometime around 2005.  2.  Hypertension for the last 20 years.  3.  In March of 2006, she had a left L4-L5 microdiskectomy.  4.  Left total knee arthroplasty in 1994 and a right total knee arthroplasty      in about the year 2000.  5.  EGD in 2002 with a dilation which showed a hiatal hernia, but was      otherwise unremarkable.  6.  Osteoarthritis.  7.  Nephrolithiasis x1 remotely.  8.  Gastroesophageal reflux, but only with occasional symptoms, per the      patient.  9.  Pneumonia in 2000.  10. Appendectomy in 1960.  11. Total abdominal hysterectomy in 1969.  12. The patient reports normal lipids and no history of coronary disease,      stroke or diabetes.   ALLERGIES:  MORPHINE - nausea and vomiting.   MEDICATIONS:  1.  Coumadin 2 mg five days a week, 4 mg each Monday and Friday.  2.  Atacand 32 mg daily.  (No other vitamins or supplements.  Apparently, she was on an atenolol-type  therapy, which was stopped with her low pulse rates, and did take 3 days of  Sudafed last week.)   SOCIAL HISTORY:  No tobacco, no drug use history.  She is married.  Her  husband's name is Cherre Huger, and they have been married since 1950.  There are 2  sons and 4 grandchildren.  She was a smoker for 25 years and quit at around  age 60.  No alcohol or drug use history.   FAMILY HISTORY:  Father died of leukemia.  Mother died of heart disease.   REVIEW OF SYSTEMS:  A home health nurse was visiting the patient's husband  and noticed her to be short of breath, and this is when further evaluation  was initiated today.  The other review of systems is noted for the heart  rate problem, as described above.  She occasionally has some reflux type  symptoms that wake her at night.  Her left arm is hurting today.  She has  been a little bit short of breath for a while.  She is not more descriptive  about how long she has noticed this, but she does have  some shortness of  breath with exertion.  She denies any blood from above or below.  She denies  any nausea, vomiting or diarrhea.  She denies any claudication or edema type  symptoms.   PHYSICAL EXAMINATION:  VITAL SIGNS:  Blood pressure of 200/97 initially, now  196/87, temperature of 98.9, pulse 67, respiratory rate 18, 99% saturation  on 2 liters of oxygen.  GENERAL:  She is in no acute distress, age appropriate.  HEENT:  Normocephalic and atraumatic.  Pupils equal, round and reactive to  light.  Extraocular movements are intact.  Cranial nerves II-XII are normal.  There is no JVD, no bruits.  LUNGS:  Clear to auscultation bilaterally with no wheezing, rales, or  rhonchi.  HEART:  Irregularly irregular with no murmurs, rubs, or gallops.  ABDOMEN:  Soft, nontender, nondistended, with no masses.  There is no edema.  There are 2+ pulses throughout.   LABORATORY DATA:  White count is 5.5, hemoglobin 12.2, platelet count of  224,000.  There are 69% segs, 23% lymphocytes, 7% monocytes.  Myoglobin is  36; repeat check is 33.  MB fraction is less than 1.0, and the second check  is 1.1.  Troponin I on 2 sets is less than 0.05.  Sodium is 138, potassium  3.6, chloride 106, CO2 of 28, BUN 10, creatinine 0.6, glucose 98, calcium  9.1.  Total protein of 6.2, albumin 3.2.  AST of 28, ALT of 31, alkaline  phosphatase of 77, total bilirubin 0.9.   Chest x-ray is personally reviewed and shows cardiac enlargement, bibasilar  atelectasis, but otherwise is normal.  EKG shows atrial fibrillation with  right axis deviation.  There is poor R wave progression, possible an age-  indeterminate acute anterior infarction, but no ST-T wave changes are noted.  There is no known history of any echocardiogram or stress testing per the  patient report.   ASSESSMENT AND PLAN:  A 77 year old female with dyspnea on exertion, vague chest symptoms and left upper extremity pain throughout the entire day,  presenting  to the emergency room via EMS.  Will admit and place her on  telemetry.  Will continue IV access and continue oxygen supplementation.  We  will treat with serial enzymes.  We will check a BNP and PT INR test.  We  will treat with aspirin, as well as a heparin drip once we know what her INR  status is.  We will give her nitroglycerin and nitroglycerin paste to see  how she responds to this with her arm pain.  We will add Tylenol as needed.  We will continue Atacand therapy.  We will try to lower her blood pressure  with  medications that will not slow heart rate.  We will possibly add a diuretic.  She has had a history of recent bradycardia, so we will have to be sensitive  about this.  Will ask for a cardiology consultation to direct for further  work-up.  She is a full code status.           ______________________________  Redge Gainer Waynard Edwards, M.D.     MAP/MEDQ  D:  07/09/2005  T:  07/10/2005  Job:  [id number]   cc:   Gaspar Garbe, M.D.  Fax: [phone]   Doylene Canning. Ladona Ridgel, M.D.  [address]  [address]  [place]  [place] [address]   Georgiana Spinner, M.D.  Fax: [phone]   Kerrin Champagne, M.D.  Fax: [phone]

## 2010-08-14 NOTE — Op Note (Signed)
[name], [name]             ACCOUNT NO.:  [id number]   MEDICAL RECORD NO.:  [id number]          PATIENT TYPE:  INP   LOCATION:  4702                         FACILITY:  [facility]   PHYSICIAN:  Ardeth Sportsman, MD     DATE OF BIRTH:  1925/09/16   DATE OF PROCEDURE:  03/25/2006  DATE OF DISCHARGE:                               OPERATIVE REPORT   PRIMARY CARE PHYSICIAN:  Gaspar Garbe, M.D.   SURGEON:  Ardeth Sportsman, MD.   ASSISTANT:  None.   PREOPERATIVE DIAGNOSIS:  Acute cholecystitis.   POSTOPERATIVE DIAGNOSIS:  Acute cholecystitis.   PROCEDURE PERFORMED:  Laparoscopic cholecystectomy with intraoperative  cholangiogram.   ANESTHESIA:  1. General anesthesia.  2. Local anesthetic in a field block around all port sites.   SPECIMENS:  Gallbladder.   DRAINS:  None.   ESTIMATED BLOOD LOSS:  Less than 5 mL.   COMPLICATIONS:  None apparent.   INDICATIONS:  [name] is a 77 year old female with known stones who  has had symptoms of biliary colic and persistent pain with a left shift  concerning for cholecystitis.  She is on chronic anticoagulation for her  atrial fibrillation with slow ventricular response requiring pacemaker  insertion.  She has been admitted, placed on IV antibiotics and her  coumadinization has been reversed.  It is now hospital day number 3 with  an INR of 1.4.  We felt it was reasonable to go ahead and perform  cholecystectomy on her.   The anatomy and physiology of hepatobiliary and pancreatic function was  discussed.  Pathophysiology of cholecystitis with other risks was  discussed as well.  Options were discussed and recommendations were made  for laparoscopic cholecystectomy with intraoperative cholangiogram.   The risks such as stroke, heart attack, deep venous thrombosis,  pulmonary embolism and death were discussed.  Risks such as bleeding,  need for transfusion, wound infection, abscess, injury to other organs,  incisional hernia,  bile duct injury resulting in need for reoperation,  internal/external drainage and others were discussed.  Questions were  answered and she agreed to proceed.   OPERATIVE FINDINGS:  She did have a moderate amount of omental  adhesions, as well as gallbladder wall thickening consistent with acute  cholecystitis.  She did not have any empyema of the gallbladder.   DESCRIPTION OF PROCEDURE:  Informed consent was confirmed.  The patient  was already on IV antibiotics.  She underwent general anesthesia without  difficulty.  She was positioned supine with both arms tucked.  Her  abdomen was prepped and draped in a sterile fashion.  Entry was gained  in the abdomen through a right upper quadrant stab incision using  optimal entry technique.  Capnoperitoneum to 15 mmHg provided good  abdominal insufflation.  A 5 mm port was placed in the periumbilical  region as well as in the right upper quadrant under direct  visualization.  A 10 mm port was placed in the subxiphoid area.   She had some moderate adhesions on anterior abdominal wall as well as to  her gallbladder.  These were freed off using careful  controlled  dissection.  Gallbladder fundus was grasped and elevated cephalad.  Further adhesions were removed off the peritoneal surface of the  gallbladder.  Anteromedial and posterolateral peritoneal attachments of  the gallbladder to the liver were freed off and a circumferential  dissection was done revealing only two structures going from the  gallbladder down to the porta hepatis consistent with a classic critical  view.  A smaller one was going up on the side of the gallbladder wall  and pulsatile, consistent with the cystic artery.  One clip on the  gallbladder side and two clips slightly proximal were made and this  structure was transected.  This left one remaining structure going from  the infundibulum down to the gallbladder consistent with the cystic  duct.  One clip was made on the  infundibulum.  A partial cyst ductotomy  was performed.  The cystic duct was milked back proximally and there  were no stones.   A 5-French cholangiogram catheter was passed through a stab incision in  the anterior abdominal wall, flushed and passed into the cystic duct  easily.  A clip was applied and the catheter flushed well.  The  cholangiogram was done with diluted radiopaque contrast.  Contrast  flowed well up into the right and left intrahepatic bile ducts, as well  as into the common bile duct and down into the duodenum with no evidence  of any strictures or lucencies or leak.  This was consistent with a  normal cholangiogram.  Cholangiocatheter was removed.   Four clips were made on the cystic duct slightly proximal to the partial  cyst ductotomy since there was a nice long cystic duct and making effort  to stay away from the common bile duct.  In addition the cystic duct was  completely transected.  The gallbladder was freed from its remaining  attachments on the liver and removed out the subxiphoid port in an  Endocatch bag.  The fascial defect of the subxiphoid region was  approximated using a 0 Vicryl stitch using laparoscopic suture passer.   Copious irrigation was done and careful inspection of the liver bed as  well as on the clips at the porta hepatis showed excellent hemostasis.  There was no leaking of bile or any other abnormalities.  Copious  irrigation of over a liter was done with nice clear return.  There was  no evidence of any suppurative process or significant pericholecystic  fluid or any bile.  The upper three abdominal ports were removed with no  evidence of any leaking from the abdominal wall.  Capnoperitoneum was  evacuated.  Subxiphoid stitch was tied down.  The umbilical port was  removed.  Skin was closed using 4-0 Monocryl stitch.  Sterile dressings were applied.  The patient was extubated and sent to the recovery room  in stable condition.   I am  about to explain the findings to the patient's family.  I already  discussed postoperative recovery issues.      Ardeth Sportsman, MD  Electronically Signed    SCG/MEDQ  D:  03/25/2006  T:  03/26/2006  Job:  [id number]

## 2010-08-14 NOTE — Discharge Summary (Signed)
[name], [name]             ACCOUNT NO.:  [id number]   MEDICAL RECORD NO.:  [id number]          PATIENT TYPE:  INP   LOCATION:  4702                         FACILITY:  [facility]   PHYSICIAN:  Gaspar Garbe, M.D.DATE OF BIRTH:  1925/06/04   DATE OF ADMISSION:  03/23/2006  DATE OF DISCHARGE:  03/28/2006                               DISCHARGE SUMMARY   FINAL DIAGNOSES:  1. Acute cholecystitis status post laparoscopic cholecystectomy.  2. Atrial fibrillation on chronic anticoagulation  3. Hypertension.  4. Hyperlipidemia.  5. Fluid overload status post surgery, resolved with diuretics.   MEDICATIONS ON DISCHARGE:  1. Vicodin 1-2 tablets q.6 h. as needed for pain.  2. Lovenox 65 mg subcu b.i.d. through March 31, 2006.  3. Coumadin 5 mg once daily in the evening.  4. Atacand 32 mg once daily.  5. Clonidine 0.1 mg twice daily.  6. Hydrochlorothiazide 25 mg once daily.  7. Simvastatin 40 mg p.o. daily.   LABORATORY TESTS:  On the day of discharge, potassium slightly decreased  at 2.9 which was replaced orally.  BUN and creatinine 2 and 0.6,  respectively.  Glucose normal at 91, hemoglobin 10.0, hematocrit 29.3.  INR 1.3 with PT of 16.3.   Other testing:  Chest x-ray performed on March 27, 2006 showed no  evidence of pneumonia but considerable increase in bibasilar atelectasis  and mild pulmonary venous congestion consistent with fluid overload.  Urine culture performed during hospitalization was negative.  Ultrasound  of the abdomen performed March 23, 2006 showed cholelithiasis with  gallstones in the gallbladder neck; no sign of free fluid.   SUMMARY OF HOSPITAL COURSE:  [name] was admitted on March 23, 2006 having abdominal pain in her right side, radiating up towards her  right shoulder and right chest.  She was on Coumadin.  She was admitted  by the medicine service with her Coumadin held.  She was given vitamin K  subcu multiple times to try to lower  her INR below 1.5 so that she could  undergo surgery.  Surgical consultation was undertaken at the time of  her admission with a laparoscopic cholecystectomy performed on March 25, 2006 without incident;  please see surgical record for details.  The  patient did well postoperatively but had received considerable amounts  of fluid during that time and was somewhat short of breath on March 27, 2006.  Cardiac evaluation was undertaken showing chest x-ray with  fluid overload.  The patient was diuresed approximately 4 liters of  urine output, and her symptoms resolved.  She was given supplemental  potassium prior to discharge from the hospital as her INR was below 2.0.  She was bridged with Lovenox and will continue to do so until she  resumes her normal level of INR.  She will be followed up in my office  at [facility] with [name] Pace, our pharmacist, who  normally follows the patient's Coumadin.  She will continue on Lovenox  until she reaches an INR of 2.0. She was able to ambulate the halls and  eat normally prior to discharge and  was discharged home in good  condition with her family.   FOLLOW-UP:  She is to follow-up with her surgeon, in 2-3 weeks; their  phone number, [phone], has been provided to the patient.  She will be  called by my office for set up of her appointment on January3 and given  the time of this appointment later today.   CONDITION ON DISCHARGE:  Good.      Gaspar Garbe, M.D.  Electronically Signed     RWT/MEDQ  D:  03/28/2006  T:  03/28/2006  Job:  [id number]

## 2010-08-14 NOTE — Discharge Summary (Signed)
[name], [name]             ACCOUNT NO.:  [id number]   MEDICAL RECORD NO.:  [id number]           PATIENT TYPE:   LOCATION:                                 FACILITY:   PHYSICIAN:  Mark A. Perini, M.D.        DATE OF BIRTH:   DATE OF ADMISSION:  07/09/2005  DATE OF DISCHARGE:  07/11/2005                                 DISCHARGE SUMMARY   DISCHARGE DIAGNOSES:  1.  Non-cardiac chest pain and arm pain.  This is felt most likely to be      musculoskeletal as it has a positional component.  2.  Uncontrolled hypertension improved at the time of discharge.  3.  Chronic atrial fibrillation with slow ventricular response.  4.  Anemia.  5.  Gastroesophageal reflux.  6.  Hypokalemia, replaced this admission.  7.  Degenerative disk disease.  8.  Osteoarthritis.  9.  Remote nephrolithiasis.   PROCEDURES:  1.  Cardiology consultation.  2.  Adenosine Myoview test performed on July 11, 2005.  Patient had an      ejection fraction of 81% with normal wall motion with no evidence of      ischemia.  [name] did have some mild distal anteroapical attenuation likely      due to breast attenuation.   DISCHARGE MEDICATIONS:  1.  Atacand 32 mg daily.  2.  Coumadin 2 mg five days a week, 4 mg every Monday and Friday.  3.  Hydrochlorothiazide 25 mg once a day.  4.  Potassium chloride 20 mEq daily.  5.  Tylenol 650 mg every six hours as needed.  6.  Norvasc 5 mg daily.  7.  Prilosec over-the-counter 20 mg once daily with food.   HISTORY OF PRESENT ILLNESS:  [name] is a pleasant 77 year old female who  presented to the emergency room with dyspnea, dyspnea on exertion, chest and  arm pain.  In the emergency room two sets of cardiac enzymes were negative;  however, [name] failed to respond significantly to sublingual nitroglycerin and  [name] was therefore admitted.   HOSPITAL COURSE:  [name] was treated with nitroglycerin drip with  improvement of [name] blood pressure and [name] was treated with  heparin drip.  [name] was evaluated by cardiology.  It was felt that the patient did not rule  in for myocardial infarction.  It was felt that most of [name] symptoms were  due to poorly controlled hypertension.  [name] did have some chest pain and arm  pain; however, this seemed to be more positional as of July 11, 2005.  [name]  did undergo a stress test with the results as noted above and [name] blood  pressure was controlled.  [name] was therefore deemed stable for discharge home  on July 11, 2005.   DISCHARGE PHYSICAL EXAMINATION:  VITAL SIGNS:  Temperature 98.9, afebrile,  pulse 61, respiratory rate 18, blood pressure 136/62, 95% saturation on 2 L  of oxygen.  However, [name] oxygen level was okay on room air prior to  discharge.  GENERAL:  [name] was in no acute distress.  LUNGS:  Clear  to auscultation bilaterally.  No wheezes, rales, or rhonchi.  HEART:  Irregularly irregular with no murmur, rub, or gallop.  [name] did have  some positional left arm when [name] raised it above [name] head with no  peripheral edema.   DISCHARGE LABORATORY DATA:  Sodium 136, potassium 3.7, chloride 108, CO2 24,  BUN 10, creatinine 0.8, glucose 103, calcium 8.3.  Multiple sets of cardiac  enzymes were negative.   Thyroid function tests were normal with a TSH of 1.32.  Magnesium and  phosphorus were normal.   DISCHARGE INSTRUCTIONS:  [name] is to follow a low fat, low salt diet.  [name] is to check [name] blood pressure and pulse once a day, record these, and  bring those to [name] upcoming visits.  [name] is to follow up with Dr. Wylene Simmer in  one week and [name] is to follow up with [facility] as per their  instructions.  [name] is to be scheduled for an exercise stress test to  evaluate [name] chronotropic response to exercise.  [name] is to call if there are  any recurrent problems.           ______________________________  Redge Gainer Waynard Edwards, M.D.     MAP/MEDQ  D:  08/19/2005  T:  08/20/2005  Job:  [id number]   cc:   Gaspar Garbe, M.D.  Fax: [phone]   Duke Salvia, M.D.  [address]  [address]  [place]  [place] [address]

## 2010-08-14 NOTE — Op Note (Signed)
[name], [name]             ACCOUNT NO.:  [id number]   MEDICAL RECORD NO.:  [id number]          PATIENT TYPE:  OBV   LOCATION:  1478                         FACILITY:  [facility]   PHYSICIAN:  Kerrin Champagne, M.D.   DATE OF BIRTH:  Apr 12, 1925   DATE OF PROCEDURE:  06/12/2004  DATE OF DISCHARGE:                                 OPERATIVE REPORT   PREOPERATIVE DIAGNOSIS:  Herniated nucleus pulposus, left L4-5.   POSTOPERATIVE DIAGNOSIS:  Herniated nucleus pulposus, left L4-5.   PROCEDURE:  Left L4-5 microdiskectomy.   SURGEON:  Kerrin Champagne, M.D.   ASSISTANT:  Wende Neighbors, PA-C.   ANESTHESIA:  GOT, Dr. Lucille Passy.   ESTIMATED BLOOD LOSS:  10 cc.   SPECIMENS:  Herniated disk material, left L4-5.   COMPLICATIONS:  None.   DISPOSITION:  The patient returned to the PACU in good condition.   HISTORY:  The patient is a 77 year old female who has been followed in our  office with conservative management for herniated disk and left-sided L4-5.  She had initial symptomatic relief of the pain with epidural steroid  injection; however, persistent weakness in her left EHL, compared with the  right.  She initially was set units for microdiskectomy on January 30th;  however, she decided that she would not undergo the procedure and wished to  continue with conservative management.  After an additional one month, she  feels that she is ready to undergo the surgery in order to relieve pain in  the left leg and allow her better activities of daily living.   INTRAOPERATIVE FINDINGS:  HNP, left L4-5.   DESCRIPTION OF PROCEDURE:  After general anesthesia, the patient was  transferred to the operating room table, Wilson frame, prone position.  Standard preoperative antibiotics.  Standard prep with Duraprep solution  from the lower dorsal supine to the upper sacral levels, draped in the usual  manner.  The incision was made after first identifying levels using two  spinal  needles placed in the skin.  One found to be at the level of the  lower portion of the L4 spinous process, the second below the L5 spinous  process.  These areas of needles were marked on the skin, and then a  Viadrape was placed.  The skin was infiltrated with Marcaine 0.5% with  1:200,000 epinephrine, and then a small 1-1/2 inch incision was made in the  midline through the skin and subcutaneous layers, ellipsing the old incision  scar distally.  The incision was carried down to the lumbodorsal fascia.  This was incised on the left side at the expected L4 and L4-5 level.  A  clamp placed on the spinous process of L4, and the second intraoperative  radiograph demonstrated the L4 spinous process to be marked with a clamp.  Cobbs were then used to elevate the paralumbar muscles on the left side of  the L4-5 level off of the posterior aspect of the lamina of L4, the  posterior aspect of the interlaminar space at L4-5.  A McCullough retractor  was inserted.  The operating room draped sterilely and  brought into the  field.  Under direct visualization, soft tissue was debrided over the  posterior aspect of the interlaminar space on the left side.  A curette was  used to curettage any soft tissue remaining.  The osteotome was used to  performed initial cut in the medial aspect of the facet, removing about 10-  15% of the facet medially.  A 3 mm Kerrison was used to debride this portion  of the inferior articular process medially of L4.  A partial semi-  hemilaminectomy was then carried out over the inferior aspect of the left L4  lamina, continuing this superiorly.  A 10 mm Kerrison was then used to enter  the spinal canal over the superior aspect of the lamina of L5, performing  foraminotomy over the L5 nerve root and then resecting the medial aspect of  the superior articular process of L5, identifying the pedicle of L5.  Under  the microscope, the ligamentum flavum was resected in total along  the left  side all the way up to its attachment to the ventral surface of the inferior  aspect of the lamina of L4.  With this resection, partial medial facetectomy  carried out over 10-15%, as noted.  Then the thecal sac and L5 nerve root  identified.  A hockey stick neural probe could not be passed out the neural  foramen of L5 due to presumed disk impingement on the 5 root from the  ventral aspect.  A Penfield 4 was then able to be passed along the medial  border of the L5 pedicle, and this defined the lateral border of both the L5  nerve root and the thecal sac.  Was used to retract the thecal sac and  identification of several fragments of disk was immediately present.  A  pituitary rongeur was used to excise this disk material centrally and along  the left side.  A hockey stick nerve probe was used to additionally free of  further material as well as a nerve hook used to free of disk material that  was ventral to the L5 nerve root as it exited out the left L5 neural  foramen.  Following excision of all of this disk material, exploration was  carried out in the left side spinal canal superiorly to the L4-5 disk, where  the disk was noted to be bulging and protruding diffusely.  A 15 blade  scalpel was used to incise this disk on the left side.  A pituitary rongeur  was used to excise minor amounts of disk material present here.  It is felt  that the area of disk herniation extended out just over the inferior aspect  of the left lateral L4-5 disk.  Pituitary rongeurs could not be introduced  into the disk due to the disk space narrowing posteriorly on the left side;  however, the spinal canal and L5 nerve root was felt to be very well  decompressed at this point.  Irrigation was performed.  Bone wax was applied  to bleeding cancellous bone surfaces.  Excess bone wax removed.  Thrombin-  soaked Gelfoam was used during the case to obtain hemostasis to small epidural veins.  When there was  no active bleeding present, all of the  Thrombin-soaked Gelfoam was removed.  A hockey stick neural probe could be  passed out the L5 neural foramen both ventral to and dorsal to the L5 nerve  root, demonstrating it being well decompressed.  With this, irrigation was  carried  out.  Soft tissues were allowed to fall back into place.  The  lumbodorsal fascia was approximated in the midline with interrupted 0 Vicryl  sutures with a UR6 needle.  Deep subcu layers were approximated with  interrupted 0 Vicryl sutures.  The more superficial layers with interrupted  2-0 Vicryl sutures.  The skin was closed with a running subcu stitch of 4-0  Vicryl.  Tincture of Benzoin and Steri-Strips applied.  The patient then  returned to a supine position.  Reactivated and extubated.  Returned to the  recovery room in satisfactory condition.  All instrument and sponge counts  were correct.   SURGICAL SPECIMENS:  Left L4-5 disk fragments.      JEN/MEDQ  D:  06/12/2004  T:  06/12/2004  Job:  [id number]

## 2010-08-14 NOTE — Consult Note (Signed)
[name], [name]             ACCOUNT NO.:  [id number]   MEDICAL RECORD NO.:  [id number]          PATIENT TYPE:  INP   LOCATION:  0103                         [facility]AC[facility]  [facility]   [facility]  1925-06-02   DATE OF CONSULTATION:  07/10/2005  DATE OF DISCHARGE:                                   CONSULTATION   CARDIOLOGY CONSULTATION:   REASON FOR CONSULTATION:  Cardiology was asked for by Dr. Waynard Edwards.   CHIEF COMPLAINT:  Arm pain.   HISTORY OF PRESENT ILLNESS:  [name] is a 77 year old female with a  history of atrial fibrillation and hypertension who presented to the  emergency department with complaints of shortness of breath, hypertension  and left upper extremity discomfort.  A few weeks ago, the patient was taken  off of her beta blocker due to bradycardia, and told to take Sudafed for 3  days.  Today she awoke with left upper extremity discomfort.  She had no  complaints of chest pain, no nausea or diaphoresis, no recent complaints of  PND or orthopnea or lower extremity swelling.  She also noted that she was a  little more short of breath with exertion during the day.  She had no  complaints of palpitations, no syncope or presyncope.  Her husband's home  health nurse took her blood pressure and it was elevated and she was told to  go to the fire department.  She was ultimately brought to [facility]  Emergency Department.  Here she was noted to have systolic blood pressures  in the 190s.  She received three sublingual nitroglycerin, and was started  on a nitroglycerin drip.  With a drop in her systolic blood pressures from  the 190s to the 130s at current, her left upper extremity discomfort is  nearly resolved.  At present, she also has no complaints of shortness of  breath.   PAST MEDICAL HISTORY:  1.  Atrial fibrillation.  2.  Hypertension.  3.  Status post back surgery.  4.  Status post knee surgery.  5.  Hiatal hernia.  6.   Osteoarthritis.  7.  GERD.  8.  Status post appendectomy.  9.  Status post TAH.   MEDICINES:  Coumadin and Atacand.   ALLERGIES:  MORPHINE.   SOCIAL HISTORY:  The patient is a former smoker, is married and lives in  [place].   FAMILY HISTORY:  Mother with history of coronary artery disease.   REVIEW OF SYSTEMS:  All systems reviewed in detail and are negative except  as noted in the history of present illness.   PHYSICAL EXAMINATION:  VITAL SIGNS:  Blood pressure 130s/70s, heart rate is  in the 50s.  GENERAL:  Well-developed, well-nourished female, alert and oriented x3, no  apparent distress.  HEENT:  Atraumatic, normocephalic.  Pupils equal, round and reactive to  light; extraocular movements intact.  Oropharynx clear.  NECK:  Supple.  No adenopathy, no JVD, no carotid bruits, no thyromegaly, no  lymphadenopathy.  CHEST:  Lungs clear to auscultation bilaterally with equal bilateral breath  sounds.  CORONARY:  Irregularly irregular,  bradycardic; no murmurs, rubs, or gallops;  2+ peripheral pulses throughout.  ABDOMEN:  Soft, nontender, nondistended, active bowel sounds; no  hepatosplenomegaly.  EXTREMITIES:  No clubbing, cyanosis, or edema.  NEUROLOGIC:  No focal deficits.  SKIN:  No rashes.  PSYCHIATRIC:  Normal affect.   ELECTROCARDIOGRAM:  EKG reviewed by me independently shows atrial  fibrillation, possible anterior infarct, otherwise no ST or T wave changes.   LABORATORIES:  PTT of 44, INR of 2.4.  BNP is 78.  CK-MB of 1.1, troponin of  less than 0.05, myoglobin of 33.7.  Sodium of 138, potassium 3.6, chloride  106, bicarb of 28, BUN of 10, creatinine of 0.6, glucose is 98, alk phos of  77, SGOT of 28, SGPT of 31, total protein 6.2, albumin of 3.2, calcium of  9.1.  White blood cell count of 5.5, hematocrit of 36, glucose of 224.   IMPRESSION AND PLAN:  [name] is a 77 year old female who presented with  uncontrolled hypertension along with left upper  extremity discomfort that  was concerning to the consultants for an anginal equivalent.  I think that  her arm discomfort mostly is due to her uncontrolled hypertension given the  fact that with a normal blood pressure, she is doing extremely well.  It is  also reassuring that the patient has had arm discomfort all day long, and  her first set of enzymes are negative.  It is also reassuring that her EKG  shows no ischemic changes.   RECOMMENDATIONS:  1.  Rule out with serial cardiac enzymes.  2.  Agree with aspirin, ARB, and holding her beta blocker.  3.  Her blood pressure is at goal now, would continue her on nitroglycerin      drip for now for blood pressure control.  4.  I have written for her to start on Norvasc at 5 mg p.o. daily with her      first dose now for blood pressure control.  5.  I have written for a lipid panel and thyroid function tests with her      next lab draw.  6.  Given the fact that her INR is 2.4, and negative cardiac enzymes after      having arm pain all day long, I would hold on her heparin drip for now.      If the patient rules in or has recurrent symptoms, I would start her on      a heparin drip.  7.  Recommend getting a transthoracic echocardiogram.     ______________________________  Rod Holler, MD    ______________________________  [facility]    TRK/MEDQ  D:  07/10/2005  T:  07/10/2005  Job:  [id number]

## 2010-08-14 NOTE — Op Note (Signed)
[name], [name]             ACCOUNT NO.:  [id number]   MEDICAL RECORD NO.:  [id number]          PATIENT TYPE:  OIB   LOCATION:  3707                         FACILITY:  [facility]   PHYSICIAN:  Doylene Canning. Ladona Ridgel, M.D.  DATE OF BIRTH:  1925/10/25   DATE OF PROCEDURE:  07/27/2005  DATE OF DISCHARGE:                                 OPERATIVE REPORT   PROCEDURE:  Implantation of a single chamber pacemaker.   INDICATIONS FOR PROCEDURE:  Symptomatic tachybrady syndrome and chronic  atrial fibrillation.   INTRODUCTION:  The patient is a very pleasant 77 year old woman with a  history of hypertension and atrial fibrillation since at least 2005.  She  has a history of rapid atrial fibrillation as well as fairly severe  bradycardia with heart rates in the 30s.  She is now referred for permanent  pacemaker insertion.   DESCRIPTION OF PROCEDURE:  After informed consent was obtained, the patient  was taken to the diagnostic EP lab in the fasting state.  After the usual  preparation and draping, intravenous Fentanyl and midazolam was given for  sedation.  Lidocaine 30 mL was infiltrated into the left infraclavicular  region.  A 5 cm incision was carried out over this region and electrocautery  utilized to dissect down to the facial plane.  We injected 10 mL of contrast  into the left upper extremity venous system demonstrating a patent left  subclavian vein.  It was subsequently punctured and the St. Jude model 1788T  52 cm active fixation pacing lead serial number [id number], was advanced into  the right ventricle.  Mapping was carried out along the RV septum and the R-  wave at the site measured 25 mV and with the lead actively fixed on the RV  septum, the R-waves measured 0.6, with the lead actively fixed the pacing  threshold was 0.6 volts at 0.5 msec.  A 10 volt pacing with the lead  actively fixed did not stimulate the diaphragm.  The pacing impedance was  754 ohms.  With these satisfactory  parameters, the lead was secured to the  subpectoralis fascia with a figure-of-eight silk suture.  Sewing sleeve was  also secured with silk suture.  Electrocautery was utilized to make a  subcutaneous pocket.  Kanamycin irrigation was utilized to irrigate the  pocket.  A St. Jude Verity ADx XL SR single chamber pacemaker, serial number  [id number], was connected to the ventricular pacing lead and placed in the  subcutaneous pocket.  Generator was secured with silk suture.  Additional  kanamycin was utilized to irrigate the pocket and then the incision was  closed with a layer of 2-0 Vicryl followed by a layer of 3-0 Vicryl followed  by a layer of 4-0 Vicryl.  Benzoin was painted on the skin.  Steri-Strips  were applied and a pressure dressing was placed.  The patient was returned  to her room in satisfactory condition.   COMPLICATIONS:  There were no immediate procedure complications.   RESULTS:  This demonstrates successful implantation of a St. Jude single  chamber defibrillator in a patient with symptomatic tachybrady syndrome.  ______________________________  Doylene Canning. Ladona Ridgel, M.D.     GWT/MEDQ  D:  07/27/2005  T:  07/28/2005  Job:  [id number]   cc:   Rollene Rotunda, M.D.  [address]  [address]  [place]  [place] [address]   Gaspar Garbe, M.D.  Fax: [phone]

## 2010-08-14 NOTE — Discharge Summary (Signed)
[name], [name]             ACCOUNT NO.:  [id number]   MEDICAL RECORD NO.:  [id number]          PATIENT TYPE:  OIB   LOCATION:  3707                         FACILITY:  [facility]   PHYSICIAN:  Doylene Canning. Ladona Ridgel, M.D.  DATE OF BIRTH:  11/01/1925   DATE OF ADMISSION:  07/27/2005  DATE OF DISCHARGE:  07/28/2005                                 DISCHARGE SUMMARY   ALLERGIES:  MORPHINE which gives nausea and vomiting.   PRINCIPAL DIAGNOSES:  1.  Discharging day #1, status post implantation of St. Jude Verity ADx VVI      pacemaker.  2.  Permanent atrial fibrillation with pauses.  3.  A symptomatic tachy-brady syndrome (lightheadedness).   SECONDARY DIAGNOSES:  1.  Hypertension.  2.  Hiatal hernia/gastroesophageal reflux disease/esophageal      stricture/status post dilatations.  3.  Osteoarthritis.  4.  Nephrolithiasis.  5.  History of disk disease, status post L4-5 microdiskectomy.  6.  Status post bilateral total knee arthroplasties.  7.  Status post appendectomy/status post hysterectomy.   PROCEDURE:  On Jul 27, 2005, implantation of St. Jude single chamber  pacemaker, set at [facility], Dr. Lewayne Bunting.   HISTORY:  Ms. Gottsch is a 77 year old female with chronic atrial  fibrillation.  She has symptomatic tachy/brady syndrome, admitted on Jul 27, 2005, on an elective basis for permanent pacemaker implantation.   The patient documented bradycardia, heart rates in the low 30s.  She has not  had syncope.  Her atrial fibrillation has been difficult to control without  incurring bradycardia.  The patient presents on Jul 27, 2005, for pacemaker  implantation.   HOSPITAL COURSE:  The patient presented to [facility] electively on  Jul 27, 2005.  She had implantation of a St. Jude single chamber pacemaker.  The patient has been V-pacing on demand.  She is in atrial fibrillation,  controlled ventricular rate.  The incision site has no hematoma and is  healing nicely.  There is no  erythema.  The patient has no complaints.  Chest is clear.  Chest x-ray shows the lead is in appropriate position.  There is no pneumothorax.  The device has been interrogated, and the values  are within normal limits.  Mobility of the left arm has been discussed with  the patient, and she has follow up appointments.  She is asked to continue  to follow her low sodium, low cholesterol diet.  She is asked not to drive  for the next week.  She is asked not to lift anything heavier than 10 pounds  for the next four weeks.  She is asked to keep her incision dry for the next  seven days, and to sponge bathe until Tuesday, Aug 03, 2005.   DISCHARGE MEDICATIONS:  1.  Atacand 32 mg daily.  2.  Hydrochlorothiazide 25 mg daily.  3.  Potassium chloride 20 mEq daily.  4.  Lyrica 50 mg at bedtime.  5.  Norvasc 10 mg daily.  6.  Clonidine 0.1 mg daily.  7.  Coumadin 2 mg tablets, 2 mg daily, except 4 mg on Mondays and  Fridays.      She is asked to take 4 mg today, Wednesday, and then continue her      regular dosing of Coumadin.   FOLLOWUP:  1.  With Dr. Ladona Ridgel at [facility] [address] [facility]      on Wednesday, Aug 11, 2005, at 9:20.  2.  To see Dr. Ladona Ridgel on Tuesday, November 16, 2005, at 12:10 p.m.   LABORATORY DATA:  Complete blood count:  Hemoglobin 13.4, hematocrit 39.4,  white cells 6.3, and platelets 273.  Serum electrolytes 136 is sodium,  potassium 3.6, chloride 101, bicarbonate 25, BUN is 9, creatinine is 0.7,  and glucose 95.  The PT was 15.6, INR 1.2 on Jul 27, 2005.      Maple Mirza, P.A.    ______________________________  Doylene Canning. Ladona Ridgel, M.D.    GM/MEDQ  D:  07/28/2005  T:  07/28/2005  Job:  [id number]   cc:   Gaspar Garbe, M.D.  Fax: [phone]

## 2010-08-14 NOTE — Procedures (Signed)
[facility]  Patient:    [name], [name] Visit Number: [id number] MRN: [id number]          Service Type: END Location: ENDO Attending Physician:  Sabino Gasser Dictated by:   Sabino Gasser, M.D. Proc. Date: 03/20/01 Admit Date:  03/20/2001                             Procedure Report  PROCEDURE:  Upper endoscopy with dilation.  INDICATIONS:  Hemoccult positivity.  ANESTHESIA:  Demerol 40 mg, Versed 5 mg IV in divided doses.  DESCRIPTION OF PROCEDURE:  With the patient mildly sedated in the left lateral decubitus position, the Olympus videoscopic endoscope was inserted into the mouth and passed under direct vision through the esophagus which appeared normal into the stomach.  The fundus, body and antrum, duodenal bulb and second portion of the duodenum all appeared normal.  From this point, the endoscope was slowly withdrawn taking circumferential views of the entire duodenal mucosal.  The endoscope was pulled back into the stomach, placed in retroflexion to view the stomach from below.  Hiatal hernia was seen.  The endoscope was straightened and advanced to the antrum through which a guidewire was passed.  The endoscope was removed taking circumferential views of the remaining gastric and esophageal mucosa.  Subsequently, the Savary 15 and 18 dilators were passed with minimal resistance.  With the later, the endoscope was removed.  The endoscope was reinserted.  Minimal bleeding was seen at the distal esophagus.  Otherwise unremarkable follow-up.  The patients vital signs and pulse oximetry remained stable.  The patient tolerated the procedure well without apparent complications.  FINDINGS:  Hiatal hernia, otherwise unremarkable.  Esophagus dilated to 15 and Savary.  PLAN: 1. Await clinical response. 2. The patient will call me for results and follow up with me as an    outpatient. Dictated by:   Sabino Gasser, M.D. Attending Physician:   Sabino Gasser DD:  03/20/01 TD:  03/21/01 Job: [id number]

## 2010-08-14 NOTE — Assessment & Plan Note (Signed)
[facility]                           ELECTROPHYSIOLOGY OFFICE NOTE   NAME:[name], [name]                    MRN:          [id number]  DATE:11/16/2005                            DOB:          1925/05/08    REASON FOR VISIT:  [name] returns today for followup.  She is a very  pleasant elderly woman with a history of symptomatic bradycardia and chronic  atrial fibrillation who underwent permanent pacemaker insertion back in May  2007.  She returns for followup.  She denies chest pain or shortness of  breath and overall feels well.  She denies peripheral edema.   PHYSICAL EXAMINATION:  GENERAL:  She is a pleasant, well-appearing, elderly  woman in no acute distress.  VITAL SIGNS:  Blood pressure 125/74, pulse 58 and regular, respirations  irregular, respirations 18, the weight was 147 pounds, up four pounds from  her visit back in April.  LUNGS:  Clear to auscultation bilaterally without wheezes, rhonchi, or  rales.  CARDIOVASCULAR:  Irregular rhythm with a normal S1 and S2.  EXTREMITIES:  No edema.   Interrogation of her defibrillator reveals a St. Jude Variety with R waves  of 16, pacing impedence was 551 ohms, and the pacing threshold at a volt of  0.4 milliseconds.  ____________2.79 volts.  Today, we turned her output down  to 2.5 volts from 3.5 to maximize battery longevity.   IMPRESSION:  1. Symptomatic bradycardia.  2. Chronic atrial fibrillation.  3. Status post pacemaker insertion.   DISCUSSION:  Overall, [name] is stable.  Her pacemaker is working  normally.  We will plan to see her back in May 2008.                                   Doylene Canning. Ladona Ridgel, MD   GWT/MedQ  DD:  11/16/2005  DT:  11/16/2005  Job #:  [id number]   cc:   Gaspar Garbe, MD

## 2010-08-14 NOTE — H&P (Signed)
[facility]  Patient:    [name], [name]                      MRN: [id number] Adm. Date:  05/23/00 Attending:  Fayrene Fearing P. Aplington, M.D. Dictator:   Illene Labrador. Aplington, M.D. CC:         Lilia Pro, M.D., [facility]   History and Physical  DATE OF OFFICE VISIT HISTORY AND PHYSICAL:  May 16, 2000.  CHIEF COMPLAINT:  Right knee pain.  HISTORY OF PRESENT ILLNESS:  Patient is a 77 year old female who has been seen by Dr. Illene Labrador. Aplington for a painful right knee.  She has previously undergone a left total knee replacement arthroplasty by Dr. Maisie Fus L. Presson back in 1994 and has been doing quite well with this.  The right knee has become more severe with pain that has been progressive over the past several months.  The pain is radiating down into her leg and her mobility has become difficult.  She states she is unable to shop.  She is seen in the office and x-rays demonstrated bone-on-bone in the medial compartment of the right knee. On the lateral projection, there is also patellofemoral arthritic spurring. It is felt she would benefit from undergoing a right total knee replacement arthroplasty.  Risks and benefits of this procedure have been discussed with the patient and she has elected to proceed with surgery.  ALLERGIES:  No known drug allergies.  CURRENT MEDICATIONS 1. Atenolol/hydrochlorothiazide 50/25 mg one-half tablet p.o. q.d. 2. Premarin 0.625 mg p.o. q.d. 3. She has also been provided samples of Aciphex for heartburn per    Dr. Lilia Pro.  PAST MEDICAL HISTORY:  Hypertension, osteoarthritis, history of renal calculi x 1, also prior history of pneumonia in December of 2000 and also some history of heartburn.  PAST SURGICAL HISTORY:  Appendectomy in 1960.  Hysterectomy in 1969.  Left total knee replacement in 1994 per Dr. Meade Maw.  SOCIAL HISTORY:  Patient is married and lives in a [place] home.  Denies  the use of tobacco products or alcohol products.  FAMILY HISTORY:  Mother deceased secondary to heart disease.  Father deceased secondary to leukemia.  REVIEW OF SYSTEMS:  GENERAL:  No fevers, chills or night sweats.  NEUROLOGIC: No seizures, syncope or paralysis.  RESPIRATORY:  No shortness of breath, productive cough or hemoptysis.  CARDIOVASCULAR:  No chest pain, angina or orthopnea.  GI:  No nausea, vomiting, diarrhea or constipation.  The patient does have some heartburn for which she has recently been given samples of Aciphex.  GU:  No dysuria, hematuria or discharge.  MUSCULOSKELETAL: Pertinent to the right knee found in the history of present illness.  PHYSICAL EXAMINATION  VITAL SIGNS:  Pulse 70, respirations 16, blood pressure 130/60.  GENERAL:  Patient is a 77 year old white female, well-nourished, well-developed, who appears to be in no acute distress.  HEENT:  Normocephalic, atraumatic.  Pupils are round and reactive.  EOMs are intact.  Oropharynx is clear.  NECK:  Supple.  No carotid bruits.  CHEST:  Clear to auscultation and percussion.  No rhonchi or rales.  HEART:  Heart is regular rate and rhythm.  No murmurs.  S1 and S2 noted.  ABDOMEN:  Soft and nontender.  Bowel sounds are present.  No rebound or guarding.  RECTAL:  Not done, not pertinent to present illness.  BREASTS:  Not done, not pertinent to present illness.  GENITALIA:  Not done, not pertinent  to present illness.  EXTREMITIES:  Significant to that of the right lower extremity.  Right knee motor function is intact.  No ligamentous instability.  She is extremely tender to palpation over the medial joint line.  Distal pulses are noted to be intact and present, however, only trace to +1.  X-RAY FINDINGS:  X-rays in the office show the right knee with bone-on-bone abutment of the medial compartment of the knee and also patellofemoral arthritis.  IMPRESSION 1. Right knee end-stage  osteoarthritis. 2. Status post left total knee replacement arthroplasty. 3. Hypertension. 4. Osteoarthritis. 5. History of renal calculi. 6. History of pneumonia, December of 2000. 7. History of heartburn.  PLAN:  Patient will be admitted to [facility] to undergo right total knee replacement arthroplasty.  Patient has donated two units of autologous blood in preparation for the up and coming surgery.  Patients medical doctor is Dr. Lilia Pro.  Dr. Johnsie Kindred has seen and evaluated the patient preoperatively.  Dr. Johnsie Kindred will be notified of the room number and admission and will be consulted if needed for any medical assistance with this patient throughout the hospital course. DD:  05/18/00 TD:  05/19/00 Job: [id number]

## 2010-08-22 ENCOUNTER — Encounter: Payer: Self-pay | Admitting: Internal Medicine

## 2010-09-04 ENCOUNTER — Encounter: Payer: Self-pay | Admitting: Internal Medicine

## 2010-09-04 ENCOUNTER — Ambulatory Visit (INDEPENDENT_AMBULATORY_CARE_PROVIDER_SITE_OTHER): Payer: Medicare Other | Admitting: Internal Medicine

## 2010-09-04 DIAGNOSIS — Z950 Presence of cardiac pacemaker: Secondary | ICD-10-CM

## 2010-09-04 DIAGNOSIS — I495 Sick sinus syndrome: Secondary | ICD-10-CM

## 2010-09-04 DIAGNOSIS — I4891 Unspecified atrial fibrillation: Secondary | ICD-10-CM

## 2010-09-04 DIAGNOSIS — I10 Essential (primary) hypertension: Secondary | ICD-10-CM

## 2010-09-04 NOTE — Assessment & Plan Note (Signed)
Her device is working normally. We'll recheck in several months. 

## 2010-09-04 NOTE — Patient Instructions (Signed)
Your physician wants you to follow-up in: 12 months with Dr. Taylor. You will receive a reminder letter in the mail two months in advance. If you don't receive a letter, please call our office to schedule the follow-up appointment.    

## 2010-09-04 NOTE — Assessment & Plan Note (Signed)
Her blood pressure today is elevated although on my exam it was lower. She admits to sodium indiscretion. I have encouraged her to continue her current medications, walk a regular basis, and each less salt.

## 2010-09-04 NOTE — Progress Notes (Signed)
HPI [name] returns today for followup. She is a very pleasant 77 year old woman with a history of symptomatic bradycardia, atrial fibrillation, status post permanent pacemaker insertion. She also has hypertension. She admits to dietary indiscretion with sodium. She denies palpitations. She has occasional peripheral edema. Her blood pressure has not been particularly well controlled. No Known Allergies   Current Outpatient Prescriptions  Medication Sig Dispense Refill  . amLODipine (NORVASC) 5 MG tablet Take 5 mg by mouth daily.        . cloNIDine (CATAPRES) 0.1 MG tablet Take 0.1 mg by mouth daily.        . hydrochlorothiazide 25 MG tablet Take 25 mg by mouth daily.        . potassium chloride SA (K-DUR,KLOR-CON) 20 MEQ tablet Take 20 mEq by mouth daily.        . pravastatin (PRAVACHOL) 40 MG tablet Take 40 mg by mouth at bedtime.        Marland Kitchen telmisartan (MICARDIS) 80 MG tablet Take 80 mg by mouth daily.        Marland Kitchen warfarin (COUMADIN) 2 MG tablet Take 2 mg by mouth as directed.           Past Medical History  Diagnosis Date  . GERD (gastroesophageal reflux disease)   . Nephrolithiasis   . Pneumonia   . Osteoarthritis   . Hypertension   . Automatic implantable cardiac defibrillator in situ   . Atrial fibrillation   . Bradycardia     ROS:   All systems reviewed and negative except as noted in the HPI.   Past Surgical History  Procedure Date  . Microdiskectomy 05/2004    Left L4-L5  . Arthroplasty 1994    Left total knee  . Arthroplasty 2000    Total right knee  . Dg selected hsg gdc only 2002    Dilation  . Appendectomy 1960  . Total abdominal hysterectomy 1969     Family History  Problem Relation Age of Onset  . Heart disease Mother   . Leukemia Father      History   Social History  . Marital Status: Married    Spouse Name: N/A    Number of Children: N/A  . Years of Education: N/A   Occupational History  . Not on file.   Social History Main Topics  .  Smoking status: Former Games developer  . Smokeless tobacco: Not on file   Comment: Smoker for 25 years and quit at around age 57  . Alcohol Use: No  . Drug Use: No  . Sexually Active: Not on file   Other Topics Concern  . Not on file   Social History Narrative   Married, husband's name is Cherre Huger, they have been married since [id number] sons and 4 grandchildren     BP 160/70  Pulse 68  Ht 5\' 4"  (1.626 m)  Wt 157 lb (71.215 kg)  BMI 26.95 kg/m2 BP equal 142/70 on my exam Physical Exam:  Well appearing NAD HEENT: Unremarkable Neck:  No JVD, no thyromegally Lymphatics:  No adenopathy Back:  No CVA tenderness Lungs:  Clear. Well-healed pacemaker incision HEART:  IRegular rate rhythm, no murmurs, no rubs, no clicks Abd:   positive bowel sounds, no organomegally, no rebound, no guarding Ext:  2 plus pulses, no edema, no cyanosis, no clubbing Skin:  No rashes no nodules Neuro:  CN II through XII intact, motor grossly intact  DEVICE  Normal device function.  See PaceArt for details.  Assess/Plan:

## 2010-09-04 NOTE — Assessment & Plan Note (Signed)
Her ventricular rate appears to be well-controlled. She will continue her current medications 

## 2010-12-07 ENCOUNTER — Encounter: Payer: Self-pay | Admitting: Internal Medicine

## 2010-12-07 DIAGNOSIS — I495 Sick sinus syndrome: Secondary | ICD-10-CM

## 2011-03-08 ENCOUNTER — Encounter: Payer: Self-pay | Admitting: Internal Medicine

## 2011-03-08 DIAGNOSIS — I495 Sick sinus syndrome: Secondary | ICD-10-CM

## 2011-03-18 ENCOUNTER — Other Ambulatory Visit: Payer: Self-pay | Admitting: Internal Medicine

## 2011-03-18 DIAGNOSIS — Z1231 Encounter for screening mammogram for malignant neoplasm of breast: Secondary | ICD-10-CM

## 2011-04-15 DIAGNOSIS — E785 Hyperlipidemia, unspecified: Secondary | ICD-10-CM | POA: Diagnosis not present

## 2011-04-15 DIAGNOSIS — M1990 Unspecified osteoarthritis, unspecified site: Secondary | ICD-10-CM | POA: Diagnosis not present

## 2011-04-15 DIAGNOSIS — I10 Essential (primary) hypertension: Secondary | ICD-10-CM | POA: Diagnosis not present

## 2011-04-15 DIAGNOSIS — I4891 Unspecified atrial fibrillation: Secondary | ICD-10-CM | POA: Diagnosis not present

## 2011-04-22 ENCOUNTER — Ambulatory Visit
Admission: RE | Admit: 2011-04-22 | Discharge: 2011-04-22 | Disposition: A | Discharge status: Home / Self Care | Payer: Medicare Other | Source: Ambulatory Visit | Attending: Internal Medicine | Admitting: Internal Medicine

## 2011-04-22 DIAGNOSIS — Z1231 Encounter for screening mammogram for malignant neoplasm of breast: Secondary | ICD-10-CM | POA: Diagnosis not present

## 2011-04-27 DIAGNOSIS — I4891 Unspecified atrial fibrillation: Secondary | ICD-10-CM | POA: Diagnosis not present

## 2011-04-27 DIAGNOSIS — Z7901 Long term (current) use of anticoagulants: Secondary | ICD-10-CM | POA: Diagnosis not present

## 2011-05-27 DIAGNOSIS — I4891 Unspecified atrial fibrillation: Secondary | ICD-10-CM | POA: Diagnosis not present

## 2011-05-27 DIAGNOSIS — Z7901 Long term (current) use of anticoagulants: Secondary | ICD-10-CM | POA: Diagnosis not present

## 2011-06-07 ENCOUNTER — Encounter: Payer: Self-pay | Admitting: Internal Medicine

## 2011-06-07 DIAGNOSIS — I495 Sick sinus syndrome: Secondary | ICD-10-CM | POA: Diagnosis not present

## 2011-06-24 DIAGNOSIS — Z7901 Long term (current) use of anticoagulants: Secondary | ICD-10-CM | POA: Diagnosis not present

## 2011-06-24 DIAGNOSIS — I4891 Unspecified atrial fibrillation: Secondary | ICD-10-CM | POA: Diagnosis not present

## 2011-07-27 DIAGNOSIS — Z7901 Long term (current) use of anticoagulants: Secondary | ICD-10-CM | POA: Diagnosis not present

## 2011-07-27 DIAGNOSIS — I4891 Unspecified atrial fibrillation: Secondary | ICD-10-CM | POA: Diagnosis not present

## 2011-09-02 DIAGNOSIS — M5137 Other intervertebral disc degeneration, lumbosacral region: Secondary | ICD-10-CM | POA: Diagnosis not present

## 2011-09-02 DIAGNOSIS — M545 Low back pain: Secondary | ICD-10-CM | POA: Diagnosis not present

## 2011-09-09 DIAGNOSIS — I4891 Unspecified atrial fibrillation: Secondary | ICD-10-CM | POA: Diagnosis not present

## 2011-09-09 DIAGNOSIS — Z7901 Long term (current) use of anticoagulants: Secondary | ICD-10-CM | POA: Diagnosis not present

## 2011-09-14 ENCOUNTER — Ambulatory Visit (INDEPENDENT_AMBULATORY_CARE_PROVIDER_SITE_OTHER): Payer: Medicare Other | Admitting: Internal Medicine

## 2011-09-14 ENCOUNTER — Encounter: Payer: Self-pay | Admitting: Internal Medicine

## 2011-09-14 VITALS — BP 126/62 | HR 60 | Ht 64.0 in | Wt 152.0 lb

## 2011-09-14 DIAGNOSIS — I10 Essential (primary) hypertension: Secondary | ICD-10-CM

## 2011-09-14 DIAGNOSIS — I4891 Unspecified atrial fibrillation: Secondary | ICD-10-CM

## 2011-09-14 DIAGNOSIS — Z950 Presence of cardiac pacemaker: Secondary | ICD-10-CM | POA: Diagnosis not present

## 2011-09-14 LAB — PACEMAKER DEVICE OBSERVATION
BATTERY VOLTAGE: 2.78 V
BRDY-0002RV: 60 {beats}/min
RV LEAD THRESHOLD: 0.75 V
VENTRICULAR PACING PM: 65

## 2011-09-14 NOTE — Patient Instructions (Signed)
Your physician wants you to follow-up in: 12 months with Dr. Taylor. You will receive a reminder letter in the mail two months in advance. If you don't receive a letter, please call our office to schedule the follow-up appointment.    

## 2011-09-14 NOTE — Assessment & Plan Note (Signed)
Her ventricular rate is well controlled. No change in medical therapy. 

## 2011-09-14 NOTE — Assessment & Plan Note (Signed)
Her pacemaker is working normally. We'll plan to recheck in several months. 

## 2011-09-14 NOTE — Assessment & Plan Note (Signed)
Her blood pressure remains well controlled. I've encouraged the patient to continue her current medical therapy and maintain a low-sodium diet.

## 2011-09-14 NOTE — Progress Notes (Signed)
HPI Mrs. Dobbins returns today for followup. She is a very pleasant 78 year old woman with symptomatic bradycardia and chronic atrial fibrillation. She also has hypertension and dyslipidemia. In the interim, she has done well. She denies chest pain, shortness of breath, or peripheral edema. No syncope. No Known Allergies   Current Outpatient Prescriptions  Medication Sig Dispense Refill  . amLODipine (NORVASC) 5 MG tablet Take 5 mg by mouth daily.        . cloNIDine (CATAPRES) 0.1 MG tablet Take 0.1 mg by mouth daily.        . hydrochlorothiazide 25 MG tablet Take 25 mg by mouth daily.        . potassium chloride SA (K-DUR,KLOR-CON) 20 MEQ tablet Take 20 mEq by mouth daily.        . pravastatin (PRAVACHOL) 40 MG tablet Take 40 mg by mouth at bedtime.        Marland Kitchen telmisartan (MICARDIS) 80 MG tablet Take 80 mg by mouth daily.        Marland Kitchen warfarin (COUMADIN) 2 MG tablet Take 2 mg by mouth as directed.           Past Medical History  Diagnosis Date  . GERD (gastroesophageal reflux disease)   . Nephrolithiasis   . Pneumonia   . Osteoarthritis   . Hypertension   . Automatic implantable cardiac defibrillator in situ   . Atrial fibrillation   . Bradycardia     ROS:   All systems reviewed and negative except as noted in the HPI.   Past Surgical History  Procedure Date  . Microdiskectomy 05/2004    Left L4-L5  . Arthroplasty 1994    Left total knee  . Arthroplasty 2000    Total right knee  . Dg selected hsg gdc only 2002    Dilation  . Appendectomy 1960  . Total abdominal hysterectomy 1969     Family History  Problem Relation Age of Onset  . Heart disease Mother   . Leukemia Father      History   Social History  . Marital Status: Married    Spouse Name: N/A    Number of Children: N/A  . Years of Education: N/A   Occupational History  . Not on file.   Social History Main Topics  . Smoking status: Former Games developer  . Smokeless tobacco: Not on file   Comment: Smoker for  25 years and quit at around age 17  . Alcohol Use: No  . Drug Use: No  . Sexually Active: Not on file   Other Topics Concern  . Not on file   Social History Narrative   Married, husband's name is Cherre Huger, they have been married since [id number] sons and 4 grandchildren     BP 126/62  Pulse 60  Ht 5\' 4"  (1.626 m)  Wt 152 lb (68.947 kg)  BMI 26.09 kg/m2  SpO2 99%  Physical Exam:  Well appearing elderly woman, NAD HEENT: Unremarkable Neck:  No JVD, no thyromegally Lungs:  Clear with no wheezes, rales, or rhonchi. Well-healed pacemaker incision. HEART:  Regular rate rhythm, no murmurs, no rubs, no clicks Abd:  soft, positive bowel sounds, no organomegally, no rebound, no guarding Ext:  2 plus pulses, no edema, no cyanosis, no clubbing Skin:  No rashes no nodules Neuro:  CN II through XII intact, motor grossly intact  DEVICE  Normal device function.  See PaceArt for details.   Assess/Plan:

## 2011-10-12 DIAGNOSIS — L821 Other seborrheic keratosis: Secondary | ICD-10-CM | POA: Diagnosis not present

## 2011-10-12 DIAGNOSIS — L570 Actinic keratosis: Secondary | ICD-10-CM | POA: Diagnosis not present

## 2011-10-14 DIAGNOSIS — Z7901 Long term (current) use of anticoagulants: Secondary | ICD-10-CM | POA: Diagnosis not present

## 2011-10-14 DIAGNOSIS — I4891 Unspecified atrial fibrillation: Secondary | ICD-10-CM | POA: Diagnosis not present

## 2011-10-19 DIAGNOSIS — E785 Hyperlipidemia, unspecified: Secondary | ICD-10-CM | POA: Diagnosis not present

## 2011-10-19 DIAGNOSIS — Z7901 Long term (current) use of anticoagulants: Secondary | ICD-10-CM | POA: Diagnosis not present

## 2011-10-19 DIAGNOSIS — I10 Essential (primary) hypertension: Secondary | ICD-10-CM | POA: Diagnosis not present

## 2011-10-19 DIAGNOSIS — I4891 Unspecified atrial fibrillation: Secondary | ICD-10-CM | POA: Diagnosis not present

## 2011-11-08 DIAGNOSIS — D1801 Hemangioma of skin and subcutaneous tissue: Secondary | ICD-10-CM | POA: Diagnosis not present

## 2011-11-17 DIAGNOSIS — I4891 Unspecified atrial fibrillation: Secondary | ICD-10-CM | POA: Diagnosis not present

## 2011-11-17 DIAGNOSIS — Z7901 Long term (current) use of anticoagulants: Secondary | ICD-10-CM | POA: Diagnosis not present

## 2011-12-06 ENCOUNTER — Encounter: Payer: Self-pay | Admitting: Internal Medicine

## 2011-12-06 DIAGNOSIS — I495 Sick sinus syndrome: Secondary | ICD-10-CM | POA: Diagnosis not present

## 2011-12-21 DIAGNOSIS — Z23 Encounter for immunization: Secondary | ICD-10-CM | POA: Diagnosis not present

## 2011-12-21 DIAGNOSIS — I4891 Unspecified atrial fibrillation: Secondary | ICD-10-CM | POA: Diagnosis not present

## 2011-12-21 DIAGNOSIS — Z7901 Long term (current) use of anticoagulants: Secondary | ICD-10-CM | POA: Diagnosis not present

## 2012-01-08 ENCOUNTER — Emergency Department (HOSPITAL_COMMUNITY)
Admission: EM | Admit: 2012-01-08 | Discharge: 2012-01-08 | Disposition: A | Discharge status: Home / Self Care | Payer: Medicare Other | Attending: Emergency Medicine | Admitting: Emergency Medicine

## 2012-01-08 ENCOUNTER — Emergency Department (HOSPITAL_COMMUNITY): Payer: Medicare Other

## 2012-01-08 ENCOUNTER — Encounter (HOSPITAL_COMMUNITY): Payer: Self-pay | Admitting: Emergency Medicine

## 2012-01-08 DIAGNOSIS — I10 Essential (primary) hypertension: Secondary | ICD-10-CM | POA: Insufficient documentation

## 2012-01-08 DIAGNOSIS — Z7901 Long term (current) use of anticoagulants: Secondary | ICD-10-CM | POA: Diagnosis not present

## 2012-01-08 DIAGNOSIS — D689 Coagulation defect, unspecified: Secondary | ICD-10-CM | POA: Insufficient documentation

## 2012-01-08 DIAGNOSIS — W010XXA Fall on same level from slipping, tripping and stumbling without subsequent striking against object, initial encounter: Secondary | ICD-10-CM | POA: Insufficient documentation

## 2012-01-08 DIAGNOSIS — S0093XA Contusion of unspecified part of head, initial encounter: Secondary | ICD-10-CM

## 2012-01-08 DIAGNOSIS — R791 Abnormal coagulation profile: Secondary | ICD-10-CM | POA: Diagnosis not present

## 2012-01-08 DIAGNOSIS — M7989 Other specified soft tissue disorders: Secondary | ICD-10-CM | POA: Diagnosis not present

## 2012-01-08 DIAGNOSIS — S0003XA Contusion of scalp, initial encounter: Secondary | ICD-10-CM | POA: Diagnosis not present

## 2012-01-08 DIAGNOSIS — S0083XA Contusion of other part of head, initial encounter: Secondary | ICD-10-CM | POA: Insufficient documentation

## 2012-01-08 DIAGNOSIS — T45515A Adverse effect of anticoagulants, initial encounter: Secondary | ICD-10-CM | POA: Insufficient documentation

## 2012-01-08 DIAGNOSIS — S0990XA Unspecified injury of head, initial encounter: Secondary | ICD-10-CM | POA: Diagnosis not present

## 2012-01-08 LAB — PROTIME-INR
INR: 2.6 — ABNORMAL HIGH (ref 0.00–1.49)
Prothrombin Time: 26.6 seconds — ABNORMAL HIGH (ref 11.6–15.2)

## 2012-01-08 NOTE — ED Notes (Signed)
Reports loss footing while walking onto stoop of gragarer Patient stated she almost felt like she was going to pass out. Patient was able to get self up and get to the phone to call for help. No vomiting, no dizziness, no blurred vision. Noted hematoma to right side of head.

## 2012-01-08 NOTE — ED Notes (Signed)
Staff helped pt to the bathroom, pt sts she is ready to go home and still no dizziness

## 2012-01-08 NOTE — ED Notes (Signed)
Pt ambulated from the stretcher to the wheel chair without any dizziness, and ambulated from the w/c to the bathroom

## 2012-01-08 NOTE — ED Provider Notes (Signed)
History     CSN: [id number]  Arrival date & time 01/08/12  1617   First MD Initiated Contact with Patient 01/08/12 1658      Chief Complaint  Patient presents with  . Head Injury    (Consider location/radiation/quality/duration/timing/severity/associated sxs/prior treatment) HPI  Patient reports she was out in her garage and that as she was going into the house there is a small step up that she tripped on and fell. She states she has a metal iron rail there and she grabbed onto it however she fell and she hit her head on the bottom portion of the rail. She denies loss of consciousness but states the blow made her feel faint. This happened about 3 PM. She complains of a lot of headache and her family gave her Tylenol at 3:30 after talking to her primary care physician. She states her headache is better now. She denies nausea, visual changes, neck pain, numbness or tingling in her extremities, or confusion. She also reports she put ice over the contused area prior to coming to the ER.  PCP Dr. Neale Burly  Past Medical History  Diagnosis Date  . GERD (gastroesophageal reflux disease)   . Nephrolithiasis   . Pneumonia   . Osteoarthritis   . Hypertension   . Automatic implantable cardiac defibrillator in situ   . Atrial fibrillation   . Bradycardia     Past Surgical History  Procedure Date  . Microdiskectomy 05/2004    Left L4-L5  . Arthroplasty 1994    Left total knee  . Arthroplasty 2000    Total right knee  . Dg selected hsg gdc only 2002    Dilation  . Appendectomy 1960  . Total abdominal hysterectomy 1969    Family History  Problem Relation Age of Onset  . Heart disease Mother   . Leukemia Father     History  Substance Use Topics  . Smoking status: Former Games developer  . Smokeless tobacco: Not on file   Comment: Smoker for 25 years and quit at around age 77  . Alcohol Use: No  Lives at home Lives alone  OB History    Grav Para Term Preterm Abortions TAB SAB Ect  Mult Living                  Review of Systems  All other systems reviewed and are negative.    Allergies  Morphine and related  Home Medications   Current Outpatient Rx  Name Route Sig Dispense Refill  . AMLODIPINE BESYLATE 10 MG PO TABS Oral Take 10 mg by mouth daily.    Marland Kitchen CLONIDINE HCL 0.1 MG PO TABS Oral Take 0.1 mg by mouth daily.     Marland Kitchen HYDROCHLOROTHIAZIDE 25 MG PO TABS Oral Take 25 mg by mouth daily.     Marland Kitchen POTASSIUM CHLORIDE CRYS ER 20 MEQ PO TBCR Oral Take 20 mEq by mouth daily.     Marland Kitchen PRAVASTATIN SODIUM 40 MG PO TABS Oral Take 40 mg by mouth daily.     . TELMISARTAN 80 MG PO TABS Oral Take 80 mg by mouth daily.     . WARFARIN SODIUM 2 MG PO TABS Oral Take 2-4 mg by mouth as directed. Take 4 mg Monday, Wednesday and Friday and 2 mg all other days      BP 152/66  Pulse 60  Temp 98.6 F (37 C) (Oral)  Resp 18  SpO2 99%  Vital signs normal    Physical Exam  Nursing note and vitals reviewed. Constitutional: She is oriented to person, place, and time. She appears well-developed and well-nourished.  Non-toxic appearance. She does not appear ill. No distress.  HENT:  Head: Normocephalic.    Right Ear: External ear normal.  Left Ear: External ear normal.  Nose: Nose normal. No mucosal edema or rhinorrhea.  Mouth/Throat: Oropharynx is clear and moist and mucous membranes are normal. No dental abscesses or uvula swelling.       Patient has a area of round swelling and bruising in her left frontal/parietal region without bleeding. She's also tender in that area.  Eyes: Conjunctivae normal and EOM are normal. Pupils are equal, round, and reactive to light.  Neck: Normal range of motion and full passive range of motion without pain. Neck supple.  Cardiovascular: Normal rate, regular rhythm and normal heart sounds.  Exam reveals no gallop and no friction rub.   No murmur heard. Pulmonary/Chest: Effort normal and breath sounds normal. No respiratory distress. She has no  wheezes. She has no rhonchi. She has no rales. She exhibits no tenderness and no crepitus.  Abdominal: Soft. Normal appearance and bowel sounds are normal. She exhibits no distension. There is no tenderness. There is no rebound and no guarding.  Musculoskeletal: Normal range of motion. She exhibits no edema and no tenderness.       Moves all extremities well.   Neurological: She is alert and oriented to person, place, and time. She has normal strength. No cranial nerve deficit.  Skin: Skin is warm, dry and intact. No rash noted. No erythema. No pallor.  Psychiatric: She has a normal mood and affect. Her speech is normal and behavior is normal. Her mood appears not anxious.    ED Course  Procedures (including critical care time)    Recheck 19:50 pt eating in NAD. Family understands she will need someone to stay with her for the next 24 hrs.    Laboratory interpretation  INR 2.6 with PT 26.6  Ct Head Wo Contrast  01/08/2012  *RADIOLOGY REPORT*  Clinical Data: Left frontal parietal head injury.  The patient is on Coumadin.  CT HEAD WITHOUT CONTRAST  Technique:  Contiguous axial images were obtained from the base of the skull through the vertex without contrast.  Comparison: None.  Findings: Mild soft tissue swelling is present over the left frontotemporal area.  There is no underlying fracture.  No acute cortical infarct, hemorrhage, or mass lesion is present. The ventricles are of normal size.  No significant extra-axial fluid collection is present.  The paranasal sinuses and mastoid air cells are clear.  The osseous skull is intact.  IMPRESSION:  1.  Mild left frontal temporal soft tissue swelling without an underlying fracture. 2.  Normal CT appearance of the brain.   Original Report Authenticated By: Jamesetta Orleans. MATTERN, M.D.      1. Contusion of head   2. Warfarin-induced coagulopathy     Plan discharge  Devoria Albe, MD, FACEP   MDM          Ward Givens, MD 01/08/12  [id number]

## 2012-01-20 DIAGNOSIS — I4891 Unspecified atrial fibrillation: Secondary | ICD-10-CM | POA: Diagnosis not present

## 2012-01-20 DIAGNOSIS — Z7901 Long term (current) use of anticoagulants: Secondary | ICD-10-CM | POA: Diagnosis not present

## 2012-02-03 DIAGNOSIS — I4891 Unspecified atrial fibrillation: Secondary | ICD-10-CM | POA: Diagnosis not present

## 2012-02-03 DIAGNOSIS — Z7901 Long term (current) use of anticoagulants: Secondary | ICD-10-CM | POA: Diagnosis not present

## 2012-03-03 DIAGNOSIS — I4891 Unspecified atrial fibrillation: Secondary | ICD-10-CM | POA: Diagnosis not present

## 2012-03-03 DIAGNOSIS — Z7901 Long term (current) use of anticoagulants: Secondary | ICD-10-CM | POA: Diagnosis not present

## 2012-03-03 DIAGNOSIS — R109 Unspecified abdominal pain: Secondary | ICD-10-CM | POA: Diagnosis not present

## 2012-03-03 DIAGNOSIS — R82998 Other abnormal findings in urine: Secondary | ICD-10-CM | POA: Diagnosis not present

## 2012-03-06 DIAGNOSIS — I495 Sick sinus syndrome: Secondary | ICD-10-CM | POA: Diagnosis not present

## 2012-03-27 ENCOUNTER — Other Ambulatory Visit: Payer: Self-pay | Admitting: Internal Medicine

## 2012-03-27 DIAGNOSIS — Z1231 Encounter for screening mammogram for malignant neoplasm of breast: Secondary | ICD-10-CM

## 2012-04-11 DIAGNOSIS — I4891 Unspecified atrial fibrillation: Secondary | ICD-10-CM | POA: Diagnosis not present

## 2012-04-11 DIAGNOSIS — Z7901 Long term (current) use of anticoagulants: Secondary | ICD-10-CM | POA: Diagnosis not present

## 2012-04-18 DIAGNOSIS — I4891 Unspecified atrial fibrillation: Secondary | ICD-10-CM | POA: Diagnosis not present

## 2012-04-18 DIAGNOSIS — E785 Hyperlipidemia, unspecified: Secondary | ICD-10-CM | POA: Diagnosis not present

## 2012-04-18 DIAGNOSIS — I10 Essential (primary) hypertension: Secondary | ICD-10-CM | POA: Diagnosis not present

## 2012-04-18 DIAGNOSIS — Z7901 Long term (current) use of anticoagulants: Secondary | ICD-10-CM | POA: Diagnosis not present

## 2012-04-25 ENCOUNTER — Ambulatory Visit
Admission: RE | Admit: 2012-04-25 | Discharge: 2012-04-25 | Disposition: A | Discharge status: Home / Self Care | Payer: Medicare Other | Source: Ambulatory Visit | Attending: Internal Medicine | Admitting: Internal Medicine

## 2012-04-25 DIAGNOSIS — Z1231 Encounter for screening mammogram for malignant neoplasm of breast: Secondary | ICD-10-CM

## 2012-05-16 DIAGNOSIS — I4891 Unspecified atrial fibrillation: Secondary | ICD-10-CM | POA: Diagnosis not present

## 2012-05-16 DIAGNOSIS — Z7901 Long term (current) use of anticoagulants: Secondary | ICD-10-CM | POA: Diagnosis not present

## 2012-06-05 DIAGNOSIS — I495 Sick sinus syndrome: Secondary | ICD-10-CM | POA: Diagnosis not present

## 2012-06-21 DIAGNOSIS — I4891 Unspecified atrial fibrillation: Secondary | ICD-10-CM | POA: Diagnosis not present

## 2012-06-21 DIAGNOSIS — M169 Osteoarthritis of hip, unspecified: Secondary | ICD-10-CM | POA: Diagnosis not present

## 2012-06-21 DIAGNOSIS — Z7901 Long term (current) use of anticoagulants: Secondary | ICD-10-CM | POA: Diagnosis not present

## 2012-07-04 DIAGNOSIS — M169 Osteoarthritis of hip, unspecified: Secondary | ICD-10-CM | POA: Diagnosis not present

## 2012-07-06 DIAGNOSIS — Z7901 Long term (current) use of anticoagulants: Secondary | ICD-10-CM | POA: Diagnosis not present

## 2012-07-06 DIAGNOSIS — I4891 Unspecified atrial fibrillation: Secondary | ICD-10-CM | POA: Diagnosis not present

## 2012-07-19 ENCOUNTER — Emergency Department (HOSPITAL_COMMUNITY): Payer: Medicare Other

## 2012-07-19 ENCOUNTER — Emergency Department (HOSPITAL_COMMUNITY)
Admission: EM | Admit: 2012-07-19 | Discharge: 2012-07-19 | Disposition: A | Discharge status: Home / Self Care | Payer: Medicare Other | Attending: Emergency Medicine | Admitting: Emergency Medicine

## 2012-07-19 ENCOUNTER — Encounter (HOSPITAL_COMMUNITY): Payer: Self-pay | Admitting: *Deleted

## 2012-07-19 DIAGNOSIS — R42 Dizziness and giddiness: Secondary | ICD-10-CM | POA: Diagnosis not present

## 2012-07-19 DIAGNOSIS — E871 Hypo-osmolality and hyponatremia: Secondary | ICD-10-CM | POA: Diagnosis not present

## 2012-07-19 DIAGNOSIS — Z95810 Presence of automatic (implantable) cardiac defibrillator: Secondary | ICD-10-CM | POA: Insufficient documentation

## 2012-07-19 DIAGNOSIS — I10 Essential (primary) hypertension: Secondary | ICD-10-CM | POA: Insufficient documentation

## 2012-07-19 DIAGNOSIS — I4891 Unspecified atrial fibrillation: Secondary | ICD-10-CM | POA: Insufficient documentation

## 2012-07-19 DIAGNOSIS — Z7901 Long term (current) use of anticoagulants: Secondary | ICD-10-CM | POA: Insufficient documentation

## 2012-07-19 DIAGNOSIS — Z8679 Personal history of other diseases of the circulatory system: Secondary | ICD-10-CM | POA: Insufficient documentation

## 2012-07-19 DIAGNOSIS — Z8739 Personal history of other diseases of the musculoskeletal system and connective tissue: Secondary | ICD-10-CM | POA: Insufficient documentation

## 2012-07-19 DIAGNOSIS — Z8701 Personal history of pneumonia (recurrent): Secondary | ICD-10-CM | POA: Insufficient documentation

## 2012-07-19 DIAGNOSIS — Z87442 Personal history of urinary calculi: Secondary | ICD-10-CM | POA: Diagnosis not present

## 2012-07-19 DIAGNOSIS — Z79899 Other long term (current) drug therapy: Secondary | ICD-10-CM | POA: Insufficient documentation

## 2012-07-19 DIAGNOSIS — Z87891 Personal history of nicotine dependence: Secondary | ICD-10-CM | POA: Diagnosis not present

## 2012-07-19 DIAGNOSIS — E878 Other disorders of electrolyte and fluid balance, not elsewhere classified: Secondary | ICD-10-CM | POA: Diagnosis not present

## 2012-07-19 DIAGNOSIS — R404 Transient alteration of awareness: Secondary | ICD-10-CM | POA: Diagnosis not present

## 2012-07-19 DIAGNOSIS — R6889 Other general symptoms and signs: Secondary | ICD-10-CM | POA: Diagnosis not present

## 2012-07-19 DIAGNOSIS — R51 Headache: Secondary | ICD-10-CM | POA: Diagnosis not present

## 2012-07-19 DIAGNOSIS — Z8719 Personal history of other diseases of the digestive system: Secondary | ICD-10-CM | POA: Insufficient documentation

## 2012-07-19 LAB — DIFFERENTIAL
Basophils Absolute: 0 10*3/uL (ref 0.0–0.1)
Lymphocytes Relative: 11 % — ABNORMAL LOW (ref 12–46)
Lymphs Abs: 0.7 10*3/uL (ref 0.7–4.0)
Neutro Abs: 5.1 10*3/uL (ref 1.7–7.7)
Neutrophils Relative %: 79 % — ABNORMAL HIGH (ref 43–77)

## 2012-07-19 LAB — PROTIME-INR
INR: 2.5 — ABNORMAL HIGH (ref 0.00–1.49)
Prothrombin Time: 25.8 seconds — ABNORMAL HIGH (ref 11.6–15.2)

## 2012-07-19 LAB — APTT: aPTT: 43 seconds — ABNORMAL HIGH (ref 24–37)

## 2012-07-19 LAB — COMPREHENSIVE METABOLIC PANEL
ALT: 16 U/L (ref 0–35)
AST: 22 U/L (ref 0–37)
Alkaline Phosphatase: 81 U/L (ref 39–117)
CO2: 32 mEq/L (ref 19–32)
GFR calc Af Amer: >90 mL/min (ref >90)
GFR calc non Af Amer: 81 mL/min — ABNORMAL LOW (ref >90)
Glucose, Bld: 106 mg/dL — ABNORMAL HIGH (ref 70–99)
Potassium: 4 mEq/L (ref 3.5–5.1)
Sodium: 130 mEq/L — ABNORMAL LOW (ref 135–145)

## 2012-07-19 LAB — POCT I-STAT TROPONIN I

## 2012-07-19 LAB — CBC
MCV: 85 fL (ref 78.0–100.0)
Platelets: 236 10*3/uL (ref 150–400)
RBC: 4.14 MIL/uL (ref 3.87–5.11)
RDW: 14 % (ref 11.5–15.5)
WBC: 6.4 10*3/uL (ref 4.0–10.5)

## 2012-07-19 MED ORDER — LORAZEPAM 0.5 MG PO TABS: 0.5000 mg | ORAL_TABLET | Freq: Two times a day (BID) | ORAL | Status: DC | PRN

## 2012-07-19 MED ORDER — MECLIZINE HCL 25 MG PO TABS
25.0000 mg | ORAL_TABLET | Freq: Once | ORAL | Status: AC
  Administered 2012-07-19: 25 mg via ORAL
  Filled 2012-07-19: qty 1

## 2012-07-19 MED ORDER — LORAZEPAM 1 MG PO TABS
0.5000 mg | ORAL_TABLET | Freq: Once | ORAL | Status: AC
  Administered 2012-07-19: 0.5 mg via ORAL
  Filled 2012-07-19: qty 1

## 2012-07-19 MED ORDER — ONDANSETRON HCL 4 MG PO TABS: 4.0000 mg | ORAL_TABLET | Freq: Four times a day (QID) | ORAL | Status: DC | PRN

## 2012-07-19 MED ORDER — ONDANSETRON HCL 4 MG/2ML IJ SOLN
4.0000 mg | Freq: Once | INTRAMUSCULAR | Status: AC
  Administered 2012-07-19: 4 mg via INTRAVENOUS

## 2012-07-19 MED ORDER — MECLIZINE HCL 25 MG PO TABS: ORAL_TABLET | ORAL | Status: DC

## 2012-07-19 MED ORDER — ONDANSETRON 4 MG PO TBDP
4.0000 mg | ORAL_TABLET | Freq: Once | ORAL | Status: AC
  Administered 2012-07-19: 4 mg via ORAL
  Filled 2012-07-19: qty 1

## 2012-07-19 MED ORDER — ONDANSETRON HCL 4 MG/2ML IJ SOLN
INTRAMUSCULAR | Status: AC
  Filled 2012-07-19: qty 2

## 2012-07-19 NOTE — ED Notes (Signed)
Pt up ambulatory to the bathroom at this time with one person assist

## 2012-07-19 NOTE — ED Provider Notes (Signed)
History     CSN: [id number]  Arrival date & time 07/19/12  1511   First MD Initiated Contact with Patient 07/19/12 1515      Chief Complaint  Patient presents with  . Hypertension  . Headache  . Nausea    (Consider location/radiation/quality/duration/timing/severity/associated sxs/prior treatment) HPI Patient reports about 1:30 this afternoon she was sitting in a chair and acutely started feeling like she was "swirling". She states she felt like things were spinning around. She also describes a headache on the top of her head. She had nausea without vomiting. She states if she moves her head fast the spinning sensation returns. She states she's feeling better now she lies still. She states she's never had this before. She denies chest pain, shortness of breath, diaphoresis, visual changes, numbness or weakness in her arms or legs.   PCP Dr Neale Burly Cardiology Dr Ladona Ridgel  Past Medical History  Diagnosis Date  . GERD (gastroesophageal reflux disease)   . Nephrolithiasis   . Pneumonia   . Osteoarthritis   . Hypertension   . Automatic implantable cardiac defibrillator in situ   . Atrial fibrillation   . Bradycardia     Past Surgical History  Procedure Laterality Date  . Microdiskectomy  05/2004    Left L4-L5  . Arthroplasty  1994    Left total knee  . Arthroplasty  2000    Total right knee  . Dg selected hsg gdc only  2002    Dilation  . Appendectomy  1960  . Total abdominal hysterectomy  1969  . Pacemaker insertion    . Joint replacement      knee replacement    Family History  Problem Relation Age of Onset  . Heart disease Mother   . Leukemia Father     History  Substance Use Topics  . Smoking status: Former Games developer  . Smokeless tobacco: Not on file     Comment: Smoker for 25 years and quit at around age 33  . Alcohol Use: No   lives at home Lives alone   [place] History   Grav Para Term Preterm Abortions TAB SAB Ect Mult Living                  Review  of Systems  All other systems reviewed and are negative.    Allergies  Morphine and related  Home Medications   Current Outpatient Rx  Name  Route  Sig  Dispense  Refill  . amLODipine (NORVASC) 10 MG tablet   Oral   Take 10 mg by mouth daily.         . cloNIDine (CATAPRES) 0.1 MG tablet   Oral   Take 0.1 mg by mouth daily.          . hydrochlorothiazide 25 MG tablet   Oral   Take 25 mg by mouth daily.          . potassium chloride SA (K-DUR,KLOR-CON) 20 MEQ tablet   Oral   Take 20 mEq by mouth daily.          . pravastatin (PRAVACHOL) 40 MG tablet   Oral   Take 40 mg by mouth daily.          Marland Kitchen telmisartan (MICARDIS) 80 MG tablet   Oral   Take 80 mg by mouth daily.          Marland Kitchen warfarin (COUMADIN) 2 MG tablet   Oral   Take 2-4 mg by mouth as directed.  Take 4 mg Monday, Wednesday and Friday and 2 mg all other days           BP 134/62  Pulse 62  Temp(Src) 97.8 F (36.6 C) (Oral)  Resp 15  SpO2 95%  Vital signs normal    Physical Exam  Nursing note and vitals reviewed. Constitutional: She is oriented to person, place, and time. She appears well-developed and well-nourished.  Non-toxic appearance. She does not appear ill. No distress.  HENT:  Head: Normocephalic and atraumatic.  Right Ear: External ear normal.  Left Ear: External ear normal.  Nose: Nose normal. No mucosal edema or rhinorrhea.  Mouth/Throat: Oropharynx is clear and moist and mucous membranes are normal. No dental abscesses or edematous.  Eyes: Conjunctivae and EOM are normal. Pupils are equal, round, and reactive to light.  Has mild nystagmus at times for just a few beats  Neck: Normal range of motion and full passive range of motion without pain. Neck supple.  Cardiovascular: Normal rate, regular rhythm and normal heart sounds.  Exam reveals no gallop and no friction rub.   No murmur heard. Pulmonary/Chest: Effort normal and breath sounds normal. No respiratory distress. She  has no wheezes. She has no rhonchi. She has no rales. She exhibits no tenderness and no crepitus.  Abdominal: Soft. Normal appearance and bowel sounds are normal. She exhibits no distension. There is no tenderness. There is no rebound and no guarding.  Musculoskeletal: Normal range of motion. She exhibits no edema and no tenderness.  Moves all extremities well.   Neurological: She is alert and oriented to person, place, and time. She has normal strength. No cranial nerve deficit.  Skin: Skin is warm, dry and intact. No rash noted. No erythema. No pallor.  Psychiatric: She has a normal mood and affect. Her speech is normal and behavior is normal. Her mood appears not anxious.    ED Course  Procedures (including critical care time)  Medications  ondansetron (ZOFRAN) injection 4 mg (4 mg Intravenous Given 07/19/12 1524)  ondansetron (ZOFRAN-ODT) disintegrating tablet 4 mg (4 mg Oral Given 07/19/12 1624)  meclizine (ANTIVERT) tablet 25 mg (25 mg Oral Given 07/19/12 1624)  meclizine (ANTIVERT) tablet 25 mg (25 mg Oral Given 07/19/12 1904)  LORazepam (ATIVAN) tablet 0.5 mg (0.5 mg Oral Given 07/19/12 1904)   Patient felt better after first dose of meclizine and Zofran. She stated when she laid to the bathroom she had mild dizziness. She states when she first arrived she needed 2 people to help her walk and then after the first dose of medications only needed one. She was given another dose of meclizine and a low dose Ativan. She easily stood up at the side of the bed and was able to ambulate to the bathroom. She was able to ambulate unassisted back to her room.   Discussed having family stay with her tonight, she has a walker at home. Advised to ask for help when ambulating to prevent injury.   Results for orders placed during the hospital encounter of 07/19/12  PROTIME-INR      Result Value Range   Prothrombin Time 25.8 (*) 11.6 - 15.2 seconds   INR 2.50 (*) 0.00 - 1.49  APTT      Result Value  Range   aPTT 43 (*) 24 - 37 seconds  CBC      Result Value Range   WBC 6.4  4.0 - 10.5 K/uL   RBC 4.14  3.87 - 5.11 MIL/uL  Hemoglobin 12.5  12.0 - 15.0 g/dL   HCT 40.9 (*) 81.1 - 91.4 %   MCV 85.0  78.0 - 100.0 fL   MCH 30.2  26.0 - 34.0 pg   MCHC 35.5  30.0 - 36.0 g/dL   RDW 78.2  95.6 - 21.3 %   Platelets 236  150 - 400 K/uL  DIFFERENTIAL      Result Value Range   Neutrophils Relative 79 (*) 43 - 77 %   Neutro Abs 5.1  1.7 - 7.7 K/uL   Lymphocytes Relative 11 (*) 12 - 46 %   Lymphs Abs 0.7  0.7 - 4.0 K/uL   Monocytes Relative 8  3 - 12 %   Monocytes Absolute 0.5  0.1 - 1.0 K/uL   Eosinophils Relative 1  0 - 5 %   Eosinophils Absolute 0.1  0.0 - 0.7 K/uL   Basophils Relative 1  0 - 1 %   Basophils Absolute 0.0  0.0 - 0.1 K/uL  COMPREHENSIVE METABOLIC PANEL      Result Value Range   Sodium 130 (*) 135 - 145 mEq/L   Potassium 4.0  3.5 - 5.1 mEq/L   Chloride 91 (*) 96 - 112 mEq/L   CO2 32  19 - 32 mEq/L   Glucose, Bld 106 (*) 70 - 99 mg/dL   BUN 8  6 - 23 mg/dL   Creatinine, Ser 0.86  0.50 - 1.10 mg/dL   Calcium 9.1  8.4 - 57.8 mg/dL   Total Protein 6.8  6.0 - 8.3 g/dL   Albumin 3.4 (*) 3.5 - 5.2 g/dL   AST 22  0 - 37 U/L   ALT 16  0 - 35 U/L   Alkaline Phosphatase 81  39 - 117 U/L   Total Bilirubin 0.4  0.3 - 1.2 mg/dL   GFR calc non Af Amer 81 (*) >90 mL/min   GFR calc Af Amer >90  >90 mL/min  TROPONIN I      Result Value Range   Troponin I <0.30  <0.30 ng/mL  POCT I-STAT TROPONIN I      Result Value Range   Troponin i, poc 0.01  0.00 - 0.08 ng/mL   Comment 3            Laboratory interpretation all normal except hypokalemia, low chloride (patient on hydrochlorothiazide), therapeutic INR    Ct Head Wo Contrast  07/19/2012  *RADIOLOGY REPORT*  Clinical Data: Hypertension.  Headache.  Nausea.  Acute onset of vertigo.  CT HEAD WITHOUT CONTRAST  Technique:  Contiguous axial images were obtained from the base of the skull through the vertex without contrast.   Comparison: CT head without contrast 01/08/2012  Findings: No acute cortical infarct, hemorrhage, mass lesion is present.  The ventricles are of normal size.  No significant extra- axial fluid collection is present. Minimal atrophy is appropriate for age.  The paranasal sinuses and mastoid air cells are clear. The osseous skull is intact.  No significant extracranial soft tissue injury is evident.  IMPRESSION: Negative CT of the head.   Original Report Authenticated By: Marin Roberts, M.D.      Date: 07/19/2012  Rate: 65  Rhythm: atrial fibrillation  QRS Axis: normal  Intervals: normal  ST/T Wave abnormalities: nonspecific T wave changes  Conduction Disutrbances:none  Narrative Interpretation:   Old EKG Reviewed: unchanged from 07/19/2012    1. Vertigo   2. Hyponatremia   3. Chloride, decreased level     New Prescriptions  LORAZEPAM (ATIVAN) 0.5 MG TABLET    Take 1 tablet (0.5 mg total) by mouth every 12 (twelve) hours as needed (dizziness not controlled by the meclizine).   MECLIZINE (ANTIVERT) 25 MG TABLET    Take 1 or 2 po Q 6hrs for dizziness   ONDANSETRON (ZOFRAN) 4 MG TABLET    Take 1 tablet (4 mg total) by mouth every 6 (six) hours as needed for nausea (or dizziness).    Plan discharge   Devoria Albe, MD, FACEP   MDM          Ward Givens, MD 07/19/12 2003

## 2012-07-19 NOTE — ED Notes (Signed)
Pt ambulated to the bathroom by Doreene Burke, EMT

## 2012-07-19 NOTE — ED Notes (Signed)
Pt has returned from being out of the department; pt placed back on monitor, continuous pulse oximetry and blood pressure cuff 

## 2012-07-19 NOTE — ED Notes (Signed)
Pt undressed, in gown, on monitor, continuous pulse oximetry and blood pressure cuff 

## 2012-07-19 NOTE — ED Notes (Signed)
Pt alert and mentating appropriately upon d/c. Pt given d/c and prescriptions. Pt verbalizes understanding and has no further questions upon d/c. NAD noted upon d/c. Pt taken out via wheelchair with family. Pt leaving with d/c teaching and prescriptions.

## 2012-07-19 NOTE — ED Notes (Addendum)
Pt brought from home for acute onset frontal headache, nausea at 1345.  EMS stated bp 178/98 upon arrival.  Pt ao x 4.  PERRL.  Presently 156/69.  Pt states nausea improved with laying flat.  Denies fall.  Pt is on coumadin for afib.

## 2012-07-19 NOTE — ED Notes (Signed)
Called and spoke with Judeth Cornfield from CT and informed that pt ready for transport

## 2012-07-19 NOTE — ED Notes (Signed)
Pt returned from radiology and placed on monitor again

## 2012-08-09 DIAGNOSIS — I4891 Unspecified atrial fibrillation: Secondary | ICD-10-CM | POA: Diagnosis not present

## 2012-08-09 DIAGNOSIS — Z7901 Long term (current) use of anticoagulants: Secondary | ICD-10-CM | POA: Diagnosis not present

## 2012-08-25 DIAGNOSIS — I789 Disease of capillaries, unspecified: Secondary | ICD-10-CM | POA: Diagnosis not present

## 2012-08-25 DIAGNOSIS — L821 Other seborrheic keratosis: Secondary | ICD-10-CM | POA: Diagnosis not present

## 2012-09-13 DIAGNOSIS — Z7901 Long term (current) use of anticoagulants: Secondary | ICD-10-CM | POA: Diagnosis not present

## 2012-09-13 DIAGNOSIS — I4891 Unspecified atrial fibrillation: Secondary | ICD-10-CM | POA: Diagnosis not present

## 2012-09-20 ENCOUNTER — Encounter: Payer: Self-pay | Admitting: Internal Medicine

## 2012-09-20 ENCOUNTER — Ambulatory Visit (INDEPENDENT_AMBULATORY_CARE_PROVIDER_SITE_OTHER): Payer: Medicare Other | Admitting: Internal Medicine

## 2012-09-20 VITALS — BP 142/57 | HR 59 | Ht 60.0 in | Wt 153.4 lb

## 2012-09-20 DIAGNOSIS — Z950 Presence of cardiac pacemaker: Secondary | ICD-10-CM | POA: Diagnosis not present

## 2012-09-20 DIAGNOSIS — I10 Essential (primary) hypertension: Secondary | ICD-10-CM

## 2012-09-20 DIAGNOSIS — I4891 Unspecified atrial fibrillation: Secondary | ICD-10-CM | POA: Diagnosis not present

## 2012-09-20 LAB — PACEMAKER DEVICE OBSERVATION
BATTERY VOLTAGE: 2.78 V
BMOD-0002RV: 8
BRDY-0004RV: 120 {beats}/min
RV LEAD AMPLITUDE: 16.6 mv
RV LEAD THRESHOLD: 0.875 V
VENTRICULAR PACING PM: 68

## 2012-09-20 NOTE — Assessment & Plan Note (Signed)
Her blood pressure is fairly well controlled. She will continue her current medical therapy. I've encouraged the patient to maintain a low-sodium diet.

## 2012-09-20 NOTE — Progress Notes (Signed)
HPI [name] returns today for followup. She is a pleasant 79 yo woman with a h/o symptomatic bradycardia, status post permanent pacemaker insertion. She has hypertension. In the last year, she has done well. She denies chest pain, shortness of breath, or syncope. No peripheral edema.  Allergies  Allergen Reactions  . Morphine And Related Nausea And Vomiting     Current Outpatient Prescriptions  Medication Sig Dispense Refill  . amLODipine (NORVASC) 10 MG tablet Take 10 mg by mouth daily.      . cloNIDine (CATAPRES) 0.1 MG tablet Take 0.1 mg by mouth daily.       . hydrochlorothiazide 25 MG tablet Take 25 mg by mouth daily.       Marland Kitchen LORazepam (ATIVAN) 0.5 MG tablet Take 1 tablet (0.5 mg total) by mouth every 12 (twelve) hours as needed (dizziness not controlled by the meclizine).  6 tablet  0  . meclizine (ANTIVERT) 25 MG tablet Take 1 or 2 po Q 6hrs for dizziness  60 tablet  0  . ondansetron (ZOFRAN) 4 MG tablet Take 1 tablet (4 mg total) by mouth every 6 (six) hours as needed for nausea (or dizziness).  12 tablet  0  . potassium chloride SA (K-DUR,KLOR-CON) 20 MEQ tablet Take 20 mEq by mouth daily.       . pravastatin (PRAVACHOL) 40 MG tablet Take 40 mg by mouth daily.       Marland Kitchen telmisartan (MICARDIS) 80 MG tablet Take 80 mg by mouth daily.       Marland Kitchen warfarin (COUMADIN) 2 MG tablet Take 2-4 mg by mouth as directed. Take 4 mg Monday, Wednesday and Friday and 2 mg all other days       No current facility-administered medications for this visit.     Past Medical History  Diagnosis Date  . GERD (gastroesophageal reflux disease)   . Nephrolithiasis   . Pneumonia   . Osteoarthritis   . Hypertension   . Automatic implantable cardiac defibrillator in situ   . Atrial fibrillation   . Bradycardia     ROS:   All systems reviewed and negative except as noted in the HPI.   Past Surgical History  Procedure Laterality Date  . Microdiskectomy  05/2004    Left L4-L5  . Arthroplasty  1994     Left total knee  . Arthroplasty  2000    Total right knee  . Dg selected hsg gdc only  2002    Dilation  . Appendectomy  1960  . Total abdominal hysterectomy  1969  . Pacemaker insertion    . Joint replacement      knee replacement     Family History  Problem Relation Age of Onset  . Heart disease Mother   . Leukemia Father      History   Social History  . Marital Status: Married    Spouse Name: N/A    Number of Children: N/A  . Years of Education: N/A   Occupational History  . Not on file.   Social History Main Topics  . Smoking status: Former Games developer  . Smokeless tobacco: Not on file     Comment: Smoker for 25 years and quit at around age 12  . Alcohol Use: No  . Drug Use: No  . Sexually Active: Not on file   Other Topics Concern  . Not on file   Social History Narrative   Married, husband's name is Cherre Huger, they have been married since 1950  2 sons and 4 grandchildren           BP 142/57  Pulse 59  Ht 5' (1.524 m)  Wt 153 lb 6.4 oz (69.582 kg)  BMI 29.96 kg/m2  Physical Exam:  Well appearing 79 year old woman,NAD HEENT: Unremarkable Neck:  6 cm JVD, no thyromegally Back:  No CVA tenderness Lungs:  Clear HEART:  Regular rate rhythm, no murmurs, no rubs, no clicks Abd:  soft, positive bowel sounds, no organomegally, no rebound, no guarding Ext:  2 plus pulses, no edema, no cyanosis, no clubbing Skin:  No rashes no nodules Neuro:  CN II through XII intact, motor grossly intact  EKG  DEVICE  Normal device function.  See PaceArt for details.   Assess/Plan:

## 2012-09-20 NOTE — Assessment & Plan Note (Signed)
Her ventricular rates are well controlled. We'll plan to see her back in the office in one year. She'll continue her current AV nodal blocking drugs. She will continue warfarin.

## 2012-09-20 NOTE — Patient Instructions (Addendum)
Your physician wants you to follow-up in: 12 months with Dr. Taylor. You will receive a reminder letter in the mail two months in advance. If you don't receive a letter, please call our office to schedule the follow-up appointment.    

## 2012-09-20 NOTE — Assessment & Plan Note (Signed)
Her [employer] single chamber pacemaker is working normally. We'll plan to recheck in several months. 

## 2012-09-28 ENCOUNTER — Encounter: Payer: Self-pay | Admitting: Internal Medicine

## 2012-10-05 ENCOUNTER — Other Ambulatory Visit: Payer: Self-pay | Admitting: Dermatology

## 2012-10-05 DIAGNOSIS — L570 Actinic keratosis: Secondary | ICD-10-CM | POA: Diagnosis not present

## 2012-10-05 DIAGNOSIS — L820 Inflamed seborrheic keratosis: Secondary | ICD-10-CM | POA: Diagnosis not present

## 2012-10-05 DIAGNOSIS — D485 Neoplasm of uncertain behavior of skin: Secondary | ICD-10-CM | POA: Diagnosis not present

## 2012-10-05 DIAGNOSIS — Z85828 Personal history of other malignant neoplasm of skin: Secondary | ICD-10-CM | POA: Diagnosis not present

## 2012-10-17 DIAGNOSIS — Z7901 Long term (current) use of anticoagulants: Secondary | ICD-10-CM | POA: Diagnosis not present

## 2012-10-17 DIAGNOSIS — E785 Hyperlipidemia, unspecified: Secondary | ICD-10-CM | POA: Diagnosis not present

## 2012-10-17 DIAGNOSIS — Z1331 Encounter for screening for depression: Secondary | ICD-10-CM | POA: Diagnosis not present

## 2012-10-17 DIAGNOSIS — Z6826 Body mass index (BMI) 26.0-26.9, adult: Secondary | ICD-10-CM | POA: Diagnosis not present

## 2012-10-17 DIAGNOSIS — M1990 Unspecified osteoarthritis, unspecified site: Secondary | ICD-10-CM | POA: Diagnosis not present

## 2012-10-17 DIAGNOSIS — I10 Essential (primary) hypertension: Secondary | ICD-10-CM | POA: Diagnosis not present

## 2012-10-17 DIAGNOSIS — I4891 Unspecified atrial fibrillation: Secondary | ICD-10-CM | POA: Diagnosis not present

## 2012-11-14 DIAGNOSIS — H04129 Dry eye syndrome of unspecified lacrimal gland: Secondary | ICD-10-CM | POA: Diagnosis not present

## 2012-11-14 DIAGNOSIS — H43399 Other vitreous opacities, unspecified eye: Secondary | ICD-10-CM | POA: Diagnosis not present

## 2012-11-14 DIAGNOSIS — H26499 Other secondary cataract, unspecified eye: Secondary | ICD-10-CM | POA: Diagnosis not present

## 2012-11-15 DIAGNOSIS — I4891 Unspecified atrial fibrillation: Secondary | ICD-10-CM | POA: Diagnosis not present

## 2012-11-15 DIAGNOSIS — Z7901 Long term (current) use of anticoagulants: Secondary | ICD-10-CM | POA: Diagnosis not present

## 2012-12-19 DIAGNOSIS — Z7901 Long term (current) use of anticoagulants: Secondary | ICD-10-CM | POA: Diagnosis not present

## 2012-12-19 DIAGNOSIS — I4891 Unspecified atrial fibrillation: Secondary | ICD-10-CM | POA: Diagnosis not present

## 2012-12-19 DIAGNOSIS — I495 Sick sinus syndrome: Secondary | ICD-10-CM

## 2012-12-19 DIAGNOSIS — Z23 Encounter for immunization: Secondary | ICD-10-CM | POA: Diagnosis not present

## 2012-12-20 DIAGNOSIS — I495 Sick sinus syndrome: Secondary | ICD-10-CM | POA: Diagnosis not present

## 2012-12-21 DIAGNOSIS — M5030 Other cervical disc degeneration, unspecified cervical region: Secondary | ICD-10-CM | POA: Diagnosis not present

## 2012-12-28 ENCOUNTER — Encounter: Payer: Self-pay | Admitting: Internal Medicine

## 2012-12-29 DIAGNOSIS — M542 Cervicalgia: Secondary | ICD-10-CM | POA: Diagnosis not present

## 2012-12-29 DIAGNOSIS — M5030 Other cervical disc degeneration, unspecified cervical region: Secondary | ICD-10-CM | POA: Diagnosis not present

## 2013-01-02 ENCOUNTER — Encounter: Payer: Self-pay | Admitting: Internal Medicine

## 2013-01-02 DIAGNOSIS — I4891 Unspecified atrial fibrillation: Secondary | ICD-10-CM | POA: Diagnosis not present

## 2013-01-02 DIAGNOSIS — M542 Cervicalgia: Secondary | ICD-10-CM | POA: Diagnosis not present

## 2013-01-02 DIAGNOSIS — I10 Essential (primary) hypertension: Secondary | ICD-10-CM | POA: Diagnosis not present

## 2013-01-02 DIAGNOSIS — Z6826 Body mass index (BMI) 26.0-26.9, adult: Secondary | ICD-10-CM | POA: Diagnosis not present

## 2013-01-04 ENCOUNTER — Ambulatory Visit
Admission: RE | Admit: 2013-01-04 | Discharge: 2013-01-04 | Disposition: A | Discharge status: Home / Self Care | Payer: 59 | Source: Ambulatory Visit | Attending: Physical Medicine and Rehabilitation | Admitting: Physical Medicine and Rehabilitation

## 2013-01-04 ENCOUNTER — Other Ambulatory Visit: Payer: Self-pay | Admitting: Physical Medicine and Rehabilitation

## 2013-01-04 DIAGNOSIS — M5030 Other cervical disc degeneration, unspecified cervical region: Secondary | ICD-10-CM

## 2013-01-04 DIAGNOSIS — M542 Cervicalgia: Secondary | ICD-10-CM | POA: Diagnosis not present

## 2013-01-08 ENCOUNTER — Other Ambulatory Visit: Payer: Medicare Other

## 2013-01-17 DIAGNOSIS — Z7901 Long term (current) use of anticoagulants: Secondary | ICD-10-CM | POA: Diagnosis not present

## 2013-01-17 DIAGNOSIS — I4891 Unspecified atrial fibrillation: Secondary | ICD-10-CM | POA: Diagnosis not present

## 2013-01-24 ENCOUNTER — Ambulatory Visit: Payer: Medicare Other | Admitting: Pharmacist Clinician (PhC)/ Clinical Pharmacy Specialist

## 2013-01-24 ENCOUNTER — Ambulatory Visit (INDEPENDENT_AMBULATORY_CARE_PROVIDER_SITE_OTHER): Payer: Medicare Other | Admitting: Pharmacist Clinician (PhC)/ Clinical Pharmacy Specialist

## 2013-01-24 DIAGNOSIS — Z7901 Long term (current) use of anticoagulants: Secondary | ICD-10-CM

## 2013-01-24 DIAGNOSIS — I4891 Unspecified atrial fibrillation: Secondary | ICD-10-CM

## 2013-01-24 DIAGNOSIS — M25559 Pain in unspecified hip: Secondary | ICD-10-CM | POA: Diagnosis not present

## 2013-01-24 DIAGNOSIS — M542 Cervicalgia: Secondary | ICD-10-CM | POA: Diagnosis not present

## 2013-01-24 DIAGNOSIS — M169 Osteoarthritis of hip, unspecified: Secondary | ICD-10-CM | POA: Diagnosis not present

## 2013-01-31 DIAGNOSIS — M542 Cervicalgia: Secondary | ICD-10-CM | POA: Diagnosis not present

## 2013-02-02 DIAGNOSIS — I4891 Unspecified atrial fibrillation: Secondary | ICD-10-CM | POA: Diagnosis not present

## 2013-02-02 DIAGNOSIS — Z7901 Long term (current) use of anticoagulants: Secondary | ICD-10-CM | POA: Diagnosis not present

## 2013-02-07 DIAGNOSIS — M542 Cervicalgia: Secondary | ICD-10-CM | POA: Diagnosis not present

## 2013-02-13 DIAGNOSIS — M542 Cervicalgia: Secondary | ICD-10-CM | POA: Diagnosis not present

## 2013-02-15 DIAGNOSIS — M542 Cervicalgia: Secondary | ICD-10-CM | POA: Diagnosis not present

## 2013-02-16 DIAGNOSIS — I4891 Unspecified atrial fibrillation: Secondary | ICD-10-CM | POA: Diagnosis not present

## 2013-02-16 DIAGNOSIS — Z7901 Long term (current) use of anticoagulants: Secondary | ICD-10-CM | POA: Diagnosis not present

## 2013-02-20 DIAGNOSIS — M542 Cervicalgia: Secondary | ICD-10-CM | POA: Diagnosis not present

## 2013-02-27 DIAGNOSIS — M542 Cervicalgia: Secondary | ICD-10-CM | POA: Diagnosis not present

## 2013-02-28 DIAGNOSIS — M5030 Other cervical disc degeneration, unspecified cervical region: Secondary | ICD-10-CM | POA: Diagnosis not present

## 2013-02-28 DIAGNOSIS — M542 Cervicalgia: Secondary | ICD-10-CM | POA: Diagnosis not present

## 2013-03-09 DIAGNOSIS — I4891 Unspecified atrial fibrillation: Secondary | ICD-10-CM | POA: Diagnosis not present

## 2013-03-09 DIAGNOSIS — Z7901 Long term (current) use of anticoagulants: Secondary | ICD-10-CM | POA: Diagnosis not present

## 2013-03-21 ENCOUNTER — Encounter: Payer: Self-pay | Admitting: Internal Medicine

## 2013-03-21 DIAGNOSIS — I495 Sick sinus syndrome: Secondary | ICD-10-CM

## 2013-03-30 DIAGNOSIS — I10 Essential (primary) hypertension: Secondary | ICD-10-CM | POA: Diagnosis not present

## 2013-03-30 DIAGNOSIS — I4891 Unspecified atrial fibrillation: Secondary | ICD-10-CM | POA: Diagnosis not present

## 2013-03-30 DIAGNOSIS — Z7901 Long term (current) use of anticoagulants: Secondary | ICD-10-CM | POA: Diagnosis not present

## 2013-04-16 DIAGNOSIS — I4891 Unspecified atrial fibrillation: Secondary | ICD-10-CM | POA: Diagnosis not present

## 2013-04-16 DIAGNOSIS — E785 Hyperlipidemia, unspecified: Secondary | ICD-10-CM | POA: Diagnosis not present

## 2013-04-16 DIAGNOSIS — Z7901 Long term (current) use of anticoagulants: Secondary | ICD-10-CM | POA: Diagnosis not present

## 2013-04-16 DIAGNOSIS — R5381 Other malaise: Secondary | ICD-10-CM | POA: Diagnosis not present

## 2013-04-16 DIAGNOSIS — I10 Essential (primary) hypertension: Secondary | ICD-10-CM | POA: Diagnosis not present

## 2013-04-16 DIAGNOSIS — M1990 Unspecified osteoarthritis, unspecified site: Secondary | ICD-10-CM | POA: Diagnosis not present

## 2013-04-16 DIAGNOSIS — Z23 Encounter for immunization: Secondary | ICD-10-CM | POA: Diagnosis not present

## 2013-04-16 DIAGNOSIS — R5383 Other fatigue: Secondary | ICD-10-CM | POA: Diagnosis not present

## 2013-05-02 ENCOUNTER — Ambulatory Visit (INDEPENDENT_AMBULATORY_CARE_PROVIDER_SITE_OTHER): Payer: Medicare Other | Admitting: Pharmacist Clinician (PhC)/ Clinical Pharmacy Specialist

## 2013-05-02 DIAGNOSIS — I4891 Unspecified atrial fibrillation: Secondary | ICD-10-CM

## 2013-05-02 DIAGNOSIS — M25569 Pain in unspecified knee: Secondary | ICD-10-CM | POA: Diagnosis not present

## 2013-05-02 DIAGNOSIS — Z79899 Other long term (current) drug therapy: Secondary | ICD-10-CM | POA: Diagnosis not present

## 2013-05-02 DIAGNOSIS — M542 Cervicalgia: Secondary | ICD-10-CM | POA: Diagnosis not present

## 2013-05-02 LAB — POCT INR: INR: 1.2

## 2013-05-08 DIAGNOSIS — Z7901 Long term (current) use of anticoagulants: Secondary | ICD-10-CM | POA: Diagnosis not present

## 2013-05-08 DIAGNOSIS — I4891 Unspecified atrial fibrillation: Secondary | ICD-10-CM | POA: Diagnosis not present

## 2013-05-09 ENCOUNTER — Other Ambulatory Visit: Payer: Self-pay

## 2013-05-09 DIAGNOSIS — Z1231 Encounter for screening mammogram for malignant neoplasm of breast: Secondary | ICD-10-CM

## 2013-05-21 ENCOUNTER — Emergency Department (HOSPITAL_COMMUNITY): Payer: Medicare Other

## 2013-05-21 ENCOUNTER — Encounter (HOSPITAL_COMMUNITY): Payer: Self-pay | Admitting: Emergency Medicine

## 2013-05-21 ENCOUNTER — Emergency Department (HOSPITAL_COMMUNITY)
Admission: EM | Admit: 2013-05-21 | Discharge: 2013-05-21 | Disposition: A | Discharge status: Home / Self Care | Payer: Medicare Other | Attending: Emergency Medicine | Admitting: Emergency Medicine

## 2013-05-21 DIAGNOSIS — S1093XA Contusion of unspecified part of neck, initial encounter: Secondary | ICD-10-CM | POA: Diagnosis not present

## 2013-05-21 DIAGNOSIS — Z8701 Personal history of pneumonia (recurrent): Secondary | ICD-10-CM | POA: Insufficient documentation

## 2013-05-21 DIAGNOSIS — M25559 Pain in unspecified hip: Secondary | ICD-10-CM | POA: Diagnosis not present

## 2013-05-21 DIAGNOSIS — Z8739 Personal history of other diseases of the musculoskeletal system and connective tissue: Secondary | ICD-10-CM | POA: Diagnosis not present

## 2013-05-21 DIAGNOSIS — Z79899 Other long term (current) drug therapy: Secondary | ICD-10-CM | POA: Insufficient documentation

## 2013-05-21 DIAGNOSIS — S3992XA Unspecified injury of lower back, initial encounter: Secondary | ICD-10-CM

## 2013-05-21 DIAGNOSIS — IMO0002 Reserved for concepts with insufficient information to code with codable children: Secondary | ICD-10-CM | POA: Insufficient documentation

## 2013-05-21 DIAGNOSIS — Z7901 Long term (current) use of anticoagulants: Secondary | ICD-10-CM | POA: Insufficient documentation

## 2013-05-21 DIAGNOSIS — S0003XA Contusion of scalp, initial encounter: Secondary | ICD-10-CM

## 2013-05-21 DIAGNOSIS — R42 Dizziness and giddiness: Secondary | ICD-10-CM | POA: Insufficient documentation

## 2013-05-21 DIAGNOSIS — R51 Headache: Secondary | ICD-10-CM | POA: Diagnosis not present

## 2013-05-21 DIAGNOSIS — Z87442 Personal history of urinary calculi: Secondary | ICD-10-CM | POA: Insufficient documentation

## 2013-05-21 DIAGNOSIS — Y92009 Unspecified place in unspecified non-institutional (private) residence as the place of occurrence of the external cause: Secondary | ICD-10-CM | POA: Insufficient documentation

## 2013-05-21 DIAGNOSIS — S79929A Unspecified injury of unspecified thigh, initial encounter: Secondary | ICD-10-CM

## 2013-05-21 DIAGNOSIS — I10 Essential (primary) hypertension: Secondary | ICD-10-CM | POA: Diagnosis not present

## 2013-05-21 DIAGNOSIS — Z87891 Personal history of nicotine dependence: Secondary | ICD-10-CM | POA: Diagnosis not present

## 2013-05-21 DIAGNOSIS — W19XXXA Unspecified fall, initial encounter: Secondary | ICD-10-CM

## 2013-05-21 DIAGNOSIS — S79919A Unspecified injury of unspecified hip, initial encounter: Secondary | ICD-10-CM | POA: Diagnosis not present

## 2013-05-21 DIAGNOSIS — I4891 Unspecified atrial fibrillation: Secondary | ICD-10-CM | POA: Insufficient documentation

## 2013-05-21 DIAGNOSIS — M533 Sacrococcygeal disorders, not elsewhere classified: Secondary | ICD-10-CM | POA: Diagnosis not present

## 2013-05-21 DIAGNOSIS — W1809XA Striking against other object with subsequent fall, initial encounter: Secondary | ICD-10-CM | POA: Insufficient documentation

## 2013-05-21 DIAGNOSIS — S0083XA Contusion of other part of head, initial encounter: Secondary | ICD-10-CM | POA: Diagnosis not present

## 2013-05-21 DIAGNOSIS — T1490XA Injury, unspecified, initial encounter: Secondary | ICD-10-CM | POA: Diagnosis not present

## 2013-05-21 DIAGNOSIS — S0993XA Unspecified injury of face, initial encounter: Secondary | ICD-10-CM | POA: Diagnosis not present

## 2013-05-21 DIAGNOSIS — Y9301 Activity, walking, marching and hiking: Secondary | ICD-10-CM | POA: Insufficient documentation

## 2013-05-21 DIAGNOSIS — Z8719 Personal history of other diseases of the digestive system: Secondary | ICD-10-CM | POA: Insufficient documentation

## 2013-05-21 DIAGNOSIS — Z95810 Presence of automatic (implantable) cardiac defibrillator: Secondary | ICD-10-CM | POA: Insufficient documentation

## 2013-05-21 LAB — PROTIME-INR
INR: 2.14 — AB (ref 0.00–1.49)
Prothrombin Time: 23.2 seconds — ABNORMAL HIGH (ref 11.6–15.2)

## 2013-05-21 LAB — CBC WITH DIFFERENTIAL/PLATELET
Basophils Absolute: 0 10*3/uL (ref 0.0–0.1)
Basophils Relative: 0 % (ref 0–1)
Eosinophils Absolute: 0.1 10*3/uL (ref 0.0–0.7)
Eosinophils Relative: 1 % (ref 0–5)
HCT: 38.5 % (ref 36.0–46.0)
Hemoglobin: 13.3 g/dL (ref 12.0–15.0)
LYMPHS ABS: 1 10*3/uL (ref 0.7–4.0)
LYMPHS PCT: 11 % — AB (ref 12–46)
MCH: 30.4 pg (ref 26.0–34.0)
MCHC: 34.5 g/dL (ref 30.0–36.0)
MCV: 87.9 fL (ref 78.0–100.0)
Monocytes Absolute: 0.7 10*3/uL (ref 0.1–1.0)
Monocytes Relative: 8 % (ref 3–12)
NEUTROS PCT: 81 % — AB (ref 43–77)
Neutro Abs: 7.4 10*3/uL (ref 1.7–7.7)
PLATELETS: 229 10*3/uL (ref 150–400)
RBC: 4.38 MIL/uL (ref 3.87–5.11)
RDW: 13.5 % (ref 11.5–15.5)
WBC: 9.1 10*3/uL (ref 4.0–10.5)

## 2013-05-21 LAB — BASIC METABOLIC PANEL
BUN: 10 mg/dL (ref 6–23)
CHLORIDE: 95 meq/L — AB (ref 96–112)
CO2: 28 meq/L (ref 19–32)
Calcium: 8.9 mg/dL (ref 8.4–10.5)
Creatinine, Ser: 0.61 mg/dL (ref 0.50–1.10)
GFR calc Af Amer: >90 mL/min (ref >90)
GFR calc non Af Amer: 80 mL/min — ABNORMAL LOW (ref >90)
GLUCOSE: 93 mg/dL (ref 70–99)
POTASSIUM: 3.8 meq/L (ref 3.7–5.3)
SODIUM: 135 meq/L — AB (ref 137–147)

## 2013-05-21 NOTE — ED Provider Notes (Signed)
CSN: [id number]     Arrival date & time 05/21/13  1357 History   First MD Initiated Contact with Patient 05/21/13 1422     Chief Complaint  Patient presents with  . Fall     (Consider location/radiation/quality/duration/timing/severity/associated sxs/prior Treatment) HPI Comments: Patient is an 80 year old female with a past medical history of hypertension, GERD, atrial fibrillation who presents after a fall that occurred this morning. The fall was mechanical. Patient reports she was walking up the stairs in the garage to get into her house when she slipped and fell backwards, landing on her lower back and also striking her head on the concrete. Patient denies LOC but does report "feeling dizzy" and having difficulty getting up from the ground after she fell. She reports pain in her "tailbone" as well that started immediately after the fall. The pain is aching and severe without radiation. Patient reports pain at her tailbone and left hip with weight bearing activity. Patient denies any other injuries. Patient takes coumadin.    Past Medical History  Diagnosis Date  . GERD (gastroesophageal reflux disease)   . Nephrolithiasis   . Pneumonia   . Osteoarthritis   . Hypertension   . Automatic implantable cardiac defibrillator in situ   . Atrial fibrillation   . Bradycardia    Past Surgical History  Procedure Laterality Date  . Microdiskectomy  05/2004    Left L4-L5  . Arthroplasty  1994    Left total knee  . Arthroplasty  2000    Total right knee  . Dg selected hsg gdc only  2002    Dilation  . Appendectomy  1960  . Total abdominal hysterectomy  1969  . Pacemaker insertion    . Joint replacement      knee replacement   Family History  Problem Relation Age of Onset  . Heart disease Mother   . Leukemia Father    History  Substance Use Topics  . Smoking status: Former Research scientist (life sciences)  . Smokeless tobacco: Not on file     Comment: Smoker for 25 years and quit at around age 61  .  Alcohol Use: No   OB History   Grav Para Term Preterm Abortions TAB SAB Ect Mult Living                 Review of Systems  Constitutional: Negative for fever, chills and fatigue.  HENT: Negative for trouble swallowing.   Eyes: Negative for visual disturbance.  Respiratory: Negative for shortness of breath.   Cardiovascular: Negative for chest pain and palpitations.  Gastrointestinal: Negative for nausea, vomiting, abdominal pain and diarrhea.  Genitourinary: Negative for dysuria and difficulty urinating.  Musculoskeletal: Positive for arthralgias. Negative for neck pain.  Skin: Negative for color change.  Neurological: Positive for dizziness and headaches. Negative for weakness.  Psychiatric/Behavioral: Negative for dysphoric mood.      Allergies  Morphine and related  Home Medications   Current Outpatient Rx  Name  Route  Sig  Dispense  Refill  . amLODipine (NORVASC) 10 MG tablet   Oral   Take 10 mg by mouth daily.         . cloNIDine (CATAPRES) 0.1 MG tablet   Oral   Take 0.1 mg by mouth daily.          . hydrochlorothiazide 25 MG tablet   Oral   Take 25 mg by mouth daily.          . potassium chloride SA (K-DUR,KLOR-CON)  20 MEQ tablet   Oral   Take 20 mEq by mouth daily.          . pravastatin (PRAVACHOL) 40 MG tablet   Oral   Take 40 mg by mouth daily.          Marland Kitchen warfarin (COUMADIN) 2 MG tablet   Oral   Take 2-4 mg by mouth as directed. Take 4 mg Monday and Friday and 2 mg all other days          BP 171/72  Pulse 60  Temp(Src) 98.5 F (36.9 C) (Oral)  Resp 15  Ht 5\' 4"  (1.626 m)  Wt 151 lb (68.493 kg)  BMI 25.91 kg/m2  SpO2 100% Physical Exam  Nursing note and vitals reviewed. Constitutional: She is oriented to person, place, and time. She appears well-developed and well-nourished. No distress.  HENT:  Head: Normocephalic and atraumatic.  3x3cm hematoma of left occiput noted that is tender to palpation. No wound.   Eyes:  Conjunctivae and EOM are normal.  Neck: Normal range of motion.  Cardiovascular: Normal rate and regular rhythm.  Exam reveals no gallop and no friction rub.   No murmur heard. Pulmonary/Chest: Effort normal and breath sounds normal. She has no wheezes. She has no rales. She exhibits no tenderness.  Abdominal: Soft. She exhibits no distension. There is no tenderness. There is no rebound.  Musculoskeletal:  Posterior left hip tenderness to palpation without deformity. ROM of left hip limited due to pain. Patient reports pain at left hip and "tailbone" with weight bearing activity.   Neurological: She is alert and oriented to person, place, and time. Coordination normal.  Speech is goal-oriented. Moves limbs without ataxia.   Skin: Skin is warm and dry.  Psychiatric: She has a normal mood and affect. Her behavior is normal.    ED Course  Procedures (including critical care time) Labs Review Labs Reviewed  CBC WITH DIFFERENTIAL - Abnormal; Notable for the following:    Neutrophils Relative % 81 (*)    Lymphocytes Relative 11 (*)    All other components within normal limits  BASIC METABOLIC PANEL - Abnormal; Notable for the following:    Sodium 135 (*)    Chloride 95 (*)    GFR calc non Af Amer 80 (*)    All other components within normal limits  PROTIME-INR - Abnormal; Notable for the following:    Prothrombin Time 23.2 (*)    INR 2.14 (*)    All other components within normal limits   Imaging Review Dg Sacrum/coccyx  05/21/2013   CLINICAL DATA:  Fall today.  Tail bone pain.  EXAM: SACRUM AND COCCYX - 2+ VIEW  COMPARISON:  None.  FINDINGS: Three views study shows no evidence for sacral fracture. SI joints are unremarkable. Arcuate lines in the sacrum are preserved.  IMPRESSION: No evidence for acute sacral fracture.   Electronically Signed   By: Misty Stanley M.D.   On: 05/21/2013 16:16   Dg Hip Complete Left  05/21/2013   CLINICAL DATA:  Status post fall.  Left hip pain.  EXAM:  LEFT HIP - COMPLETE 2+ VIEW  COMPARISON:  None.  FINDINGS: No acute bony or joint abnormality is identified. Right worse than left hip osteoarthritis is present. Lower lumbar spondylosis is noted.  IMPRESSION: No acute finding.  Right worse than left hip osteoarthritis.   Electronically Signed   By: Inge Rise M.D.   On: 05/21/2013 16:13   Ct Head Wo Contrast  05/21/2013   CLINICAL DATA:  80 year old female status post fall with head and neck injury. Initial encounter.  EXAM: CT HEAD WITHOUT CONTRAST  CT CERVICAL SPINE WITHOUT CONTRAST  TECHNIQUE: Multidetector CT imaging of the head and cervical spine was performed following the standard protocol without intravenous contrast. Multiplanar CT image reconstructions of the cervical spine were also generated.  COMPARISON:  Cervical spine CT 12/27/2012.  Head CT 07/19/2012.  FINDINGS: CT HEAD FINDINGS  Stable and negative visible paranasal sinuses and mastoids. Stable orbits soft tissues.  Left posterior superior scalp hematoma measuring up to 8 mm in thickness. Underlying calvarium intact. Other scalp soft tissues appear within normal limits. No calvarium fracture.  Calcified atherosclerosis at the skull base. Cerebral volume is within normal limits for age. No ventriculomegaly. No midline shift, mass effect, or evidence of intracranial mass lesion. No evidence of cortically based acute infarction identified. No acute intracranial hemorrhage identified. No suspicious intracranial vascular hyperdensity.  CT CERVICAL SPINE FINDINGS  A chronic mild anterolisthesis at C2-C3 and C3-C4 is stable. More pronounced 3 mm of anterolisthesis at C4-C5, mildly progressed an associated with progressed chronic facet degeneration at that level. Cervicothoracic junction alignment is within normal limits. Bilateral posterior element alignment is within normal limits. Visualized skull base is intact. No atlanto-occipital dissociation. Bulky partially calcified ligamentous  hypertrophy about the odontoid. No acute cervical spine fracture identified. Multilevel advanced left side cervical facet degeneration.  Grossly intact visualized upper thoracic levels. Negative lung apices.  Chronic thyroid goiter. No superior mediastinal lymphadenopathy. Prominence of the left lower cervical esophagus may reflect a chronic esophageal diverticulum (series 5, image 80).  IMPRESSION: 1. Scalp hematoma without underlying skull fracture. 2. Stable and largely negative for age non contrast CT appearance of the brain. 3. No acute fracture or listhesis identified in the cervical spine. Ligamentous injury is not excluded. 4. Chronic multilevel degenerative spondylolisthesis with associated advanced facet arthropathy. 5. Chronic thyroid goiter. Suspicion of chronic esophageal diverticulum.   Electronically Signed   By: Lars Pinks M.D.   On: 05/21/2013 16:19   Ct Cervical Spine Wo Contrast  05/21/2013   CLINICAL DATA:  80 year old female status post fall with head and neck injury. Initial encounter.  EXAM: CT HEAD WITHOUT CONTRAST  CT CERVICAL SPINE WITHOUT CONTRAST  TECHNIQUE: Multidetector CT imaging of the head and cervical spine was performed following the standard protocol without intravenous contrast. Multiplanar CT image reconstructions of the cervical spine were also generated.  COMPARISON:  Cervical spine CT 12/27/2012.  Head CT 07/19/2012.  FINDINGS: CT HEAD FINDINGS  Stable and negative visible paranasal sinuses and mastoids. Stable orbits soft tissues.  Left posterior superior scalp hematoma measuring up to 8 mm in thickness. Underlying calvarium intact. Other scalp soft tissues appear within normal limits. No calvarium fracture.  Calcified atherosclerosis at the skull base. Cerebral volume is within normal limits for age. No ventriculomegaly. No midline shift, mass effect, or evidence of intracranial mass lesion. No evidence of cortically based acute infarction identified. No acute  intracranial hemorrhage identified. No suspicious intracranial vascular hyperdensity.  CT CERVICAL SPINE FINDINGS  A chronic mild anterolisthesis at C2-C3 and C3-C4 is stable. More pronounced 3 mm of anterolisthesis at C4-C5, mildly progressed an associated with progressed chronic facet degeneration at that level. Cervicothoracic junction alignment is within normal limits. Bilateral posterior element alignment is within normal limits. Visualized skull base is intact. No atlanto-occipital dissociation. Bulky partially calcified ligamentous hypertrophy about the odontoid. No acute cervical spine fracture identified. Multilevel advanced  left side cervical facet degeneration.  Grossly intact visualized upper thoracic levels. Negative lung apices.  Chronic thyroid goiter. No superior mediastinal lymphadenopathy. Prominence of the left lower cervical esophagus may reflect a chronic esophageal diverticulum (series 5, image 80).  IMPRESSION: 1. Scalp hematoma without underlying skull fracture. 2. Stable and largely negative for age non contrast CT appearance of the brain. 3. No acute fracture or listhesis identified in the cervical spine. Ligamentous injury is not excluded. 4. Chronic multilevel degenerative spondylolisthesis with associated advanced facet arthropathy. 5. Chronic thyroid goiter. Suspicion of chronic esophageal diverticulum.   Electronically Signed   By: Lars Pinks M.D.   On: 05/21/2013 16:19    EKG Interpretation    Date/Time:  Monday May 21 2013 16:21:22 EST Ventricular Rate:  62 PR Interval:    QRS Duration: 164 QT Interval:  485 QTC Calculation: 493 R Axis:   -100 Text Interpretation:  Atrial fibrillation RBBB and LAFB Probable left ventricular hypertrophy Inferior infarct, old Probable lateral infarct, age indeterminate intermittent electronically paced beats No significant change since last tracing Confirmed by KNAPP  MD-J, JON ([id number]) on 05/21/2013 4:25:18 PM            MDM    Final diagnoses:  Fall  Traumatic hematoma of scalp  Lower back injury    3:30 PM Patient will have CT head and imaging of left hip and sacrum/coccyx. Patient takes coumadin and will have her INR checked as well. EKG pending. Vitals stable and patient afebrile.   6:42 PM Labs and imaging are unremarkable for acute changes. Patient's EKG shows no acute changes. Patient is feeling ok and is ready to go home. Patient advised to follow up with her PCP as needed and return to the ED with worsening or concerning symptoms.    Alvina Chou, PA-C 05/21/13 1843

## 2013-05-21 NOTE — ED Provider Notes (Signed)
Medical screening examination/treatment/procedure(s) were conducted as a shared visit with non-physician practitioner(s) and myself.  I personally evaluated the patient during the encounter.  EKG Interpretation    Date/Time:  Monday May 21 2013 16:21:22 EST Ventricular Rate:  62 PR Interval:    QRS Duration: 164 QT Interval:  485 QTC Calculation: 493 R Axis:   -100 Text Interpretation:  Atrial fibrillation RBBB and LAFB Probable left ventricular hypertrophy Inferior infarct, old Probable lateral infarct, age indeterminate intermittent electronically paced beats No significant change since last tracing Confirmed by Kaydyn Sayas  MD-J, Abrea Henle ([id number]) on 05/21/2013 4:25:18 PM            xrays without significant injuries.  Pt discharged in good condition.  Kathalene Frames, MD 05/21/13 [phone]

## 2013-05-21 NOTE — Discharge Instructions (Signed)
Take tylenol as needed for pain. Apply ice to your injuries. Refer to attached documents for more information. Return to the ED with worsening or concerning symptoms. Follow up with your doctor as needed.

## 2013-05-21 NOTE — ED Notes (Addendum)
Spoke to [place] in the lab about labs drawn at 1540; response was labs were not received; I asked for her to look around in lab and was told that she could not find them; tube station is working and have not been notified of any dysfunction in the tube stations in the ED; will call for phlebotomy to redraw labs

## 2013-05-21 NOTE — ED Notes (Signed)
Per EMS pt from home, was walking to garage up a step and tripped, hit posterior head, and tail bone . Skin tear noted to right hand. Pt denies LOC, sts did feel dizzy after incident. Denies neck, back pain. Pupils equal and reactive- no vision changes.

## 2013-05-25 ENCOUNTER — Ambulatory Visit: Payer: Medicare Other

## 2013-05-30 DIAGNOSIS — M25569 Pain in unspecified knee: Secondary | ICD-10-CM | POA: Diagnosis not present

## 2013-06-07 DIAGNOSIS — I4891 Unspecified atrial fibrillation: Secondary | ICD-10-CM | POA: Diagnosis not present

## 2013-06-07 DIAGNOSIS — Z7901 Long term (current) use of anticoagulants: Secondary | ICD-10-CM | POA: Diagnosis not present

## 2013-06-08 ENCOUNTER — Ambulatory Visit: Admit: 2013-06-08 | Discharge: 2013-06-08 | Disposition: A | Discharge status: Home / Self Care | Payer: 59

## 2013-06-08 DIAGNOSIS — Z1231 Encounter for screening mammogram for malignant neoplasm of breast: Secondary | ICD-10-CM | POA: Diagnosis not present

## 2013-06-20 ENCOUNTER — Encounter: Payer: Self-pay | Admitting: Internal Medicine

## 2013-06-20 DIAGNOSIS — Z6826 Body mass index (BMI) 26.0-26.9, adult: Secondary | ICD-10-CM | POA: Diagnosis not present

## 2013-06-20 DIAGNOSIS — I4891 Unspecified atrial fibrillation: Secondary | ICD-10-CM | POA: Diagnosis not present

## 2013-06-20 DIAGNOSIS — R42 Dizziness and giddiness: Secondary | ICD-10-CM | POA: Diagnosis not present

## 2013-06-20 DIAGNOSIS — I10 Essential (primary) hypertension: Secondary | ICD-10-CM | POA: Diagnosis not present

## 2013-06-20 DIAGNOSIS — I495 Sick sinus syndrome: Secondary | ICD-10-CM | POA: Diagnosis not present

## 2013-06-21 ENCOUNTER — Ambulatory Visit (HOSPITAL_COMMUNITY)
Admission: RE | Admit: 2013-06-21 | Discharge: 2013-06-21 | Disposition: A | Discharge status: Home / Self Care | Payer: Medicare Other | Source: Ambulatory Visit | Attending: Vascular Surgery | Admitting: Vascular Surgery

## 2013-06-21 ENCOUNTER — Other Ambulatory Visit (HOSPITAL_COMMUNITY): Payer: Self-pay | Admitting: Internal Medicine

## 2013-06-21 DIAGNOSIS — R42 Dizziness and giddiness: Secondary | ICD-10-CM

## 2013-07-10 DIAGNOSIS — Z7901 Long term (current) use of anticoagulants: Secondary | ICD-10-CM | POA: Diagnosis not present

## 2013-07-10 DIAGNOSIS — I4891 Unspecified atrial fibrillation: Secondary | ICD-10-CM | POA: Diagnosis not present

## 2013-08-08 DIAGNOSIS — I4891 Unspecified atrial fibrillation: Secondary | ICD-10-CM | POA: Diagnosis not present

## 2013-08-08 DIAGNOSIS — Z7901 Long term (current) use of anticoagulants: Secondary | ICD-10-CM | POA: Diagnosis not present

## 2013-08-13 DIAGNOSIS — Z6826 Body mass index (BMI) 26.0-26.9, adult: Secondary | ICD-10-CM | POA: Diagnosis not present

## 2013-08-13 DIAGNOSIS — J40 Bronchitis, not specified as acute or chronic: Secondary | ICD-10-CM | POA: Diagnosis not present

## 2013-08-22 ENCOUNTER — Encounter: Payer: Self-pay | Admitting: Internal Medicine

## 2013-09-04 DIAGNOSIS — M1610 Unilateral primary osteoarthritis, unspecified hip: Secondary | ICD-10-CM | POA: Diagnosis not present

## 2013-09-04 DIAGNOSIS — M169 Osteoarthritis of hip, unspecified: Secondary | ICD-10-CM | POA: Diagnosis not present

## 2013-09-06 ENCOUNTER — Other Ambulatory Visit: Payer: Self-pay | Admitting: Dermatology

## 2013-09-06 DIAGNOSIS — L608 Other nail disorders: Secondary | ICD-10-CM | POA: Diagnosis not present

## 2013-09-06 DIAGNOSIS — Z85828 Personal history of other malignant neoplasm of skin: Secondary | ICD-10-CM | POA: Diagnosis not present

## 2013-09-06 DIAGNOSIS — L980 Pyogenic granuloma: Secondary | ICD-10-CM | POA: Diagnosis not present

## 2013-09-06 DIAGNOSIS — D485 Neoplasm of uncertain behavior of skin: Secondary | ICD-10-CM | POA: Diagnosis not present

## 2013-09-06 DIAGNOSIS — L820 Inflamed seborrheic keratosis: Secondary | ICD-10-CM | POA: Diagnosis not present

## 2013-09-11 DIAGNOSIS — I4891 Unspecified atrial fibrillation: Secondary | ICD-10-CM | POA: Diagnosis not present

## 2013-09-11 DIAGNOSIS — Z7901 Long term (current) use of anticoagulants: Secondary | ICD-10-CM | POA: Diagnosis not present

## 2013-09-20 ENCOUNTER — Encounter: Payer: Medicare Other | Admitting: Internal Medicine

## 2013-09-25 ENCOUNTER — Encounter: Payer: Self-pay | Admitting: Internal Medicine

## 2013-09-25 ENCOUNTER — Ambulatory Visit (INDEPENDENT_AMBULATORY_CARE_PROVIDER_SITE_OTHER): Payer: Medicare Other | Admitting: Internal Medicine

## 2013-09-25 VITALS — BP 127/70 | HR 67 | Ht 64.0 in | Wt 151.8 lb

## 2013-09-25 DIAGNOSIS — I498 Other specified cardiac arrhythmias: Secondary | ICD-10-CM

## 2013-09-25 DIAGNOSIS — Z01810 Encounter for preprocedural cardiovascular examination: Secondary | ICD-10-CM | POA: Diagnosis not present

## 2013-09-25 DIAGNOSIS — I482 Chronic atrial fibrillation, unspecified: Secondary | ICD-10-CM

## 2013-09-25 DIAGNOSIS — Z950 Presence of cardiac pacemaker: Secondary | ICD-10-CM

## 2013-09-25 DIAGNOSIS — I4891 Unspecified atrial fibrillation: Secondary | ICD-10-CM | POA: Diagnosis not present

## 2013-09-25 NOTE — Assessment & Plan Note (Signed)
Despite her advanced age, bradycardia and PAF, she is low risk for hip replacement surgery and I have encouraged her to consider proceeding with hip replacement if her surgeon thinks they can help her symptoms. She has significant pain.

## 2013-09-25 NOTE — Progress Notes (Signed)
HPI [name] returns today for followup. She is a pleasant 80 yo woman with a h/o symptomatic bradycardia, status post permanent pacemaker insertion. She has hypertension. In the last year, she has done well. She denies chest pain, shortness of breath, or syncope. No peripheral edema. Her main complaint has been hip pain on the right side. She is considering hip replacement. She has had multiple injections but has not had much pain relief lately.  Allergies  Allergen Reactions  . Morphine And Related Nausea And Vomiting     Current Outpatient Prescriptions  Medication Sig Dispense Refill  . amLODipine (NORVASC) 10 MG tablet Take 10 mg by mouth daily.      . cloNIDine (CATAPRES) 0.1 MG tablet Take 0.1 mg by mouth daily.       . hydrochlorothiazide 25 MG tablet Take 25 mg by mouth daily.       [name] Dermatologist gave her medication for nail fungus but pt is unsure of the name of the medication (09/25/13)      . potassium chloride SA (K-DUR,KLOR-CON) 20 MEQ tablet Take 20 mEq by mouth daily.       . pravastatin (PRAVACHOL) 40 MG tablet Take 40 mg by mouth daily.       Marland Kitchen warfarin (COUMADIN) 2 MG tablet Take 2-4 mg by mouth as directed. Take 4 mg Monday and Friday and 2 mg all other days       No current facility-administered medications for this visit.     Past Medical History  Diagnosis Date  . GERD (gastroesophageal reflux disease)   . Nephrolithiasis   . Pneumonia   . Osteoarthritis   . Hypertension   . Automatic implantable cardiac defibrillator in situ   . Atrial fibrillation   . Bradycardia     ROS:   All systems reviewed and negative except as noted in the HPI.   Past Surgical History  Procedure Laterality Date  . Microdiskectomy  05/2004    Left L4-L5  . Arthroplasty  1994    Left total knee  . Arthroplasty  2000    Total right knee  . Dg selected hsg gdc only  2002    Dilation  . Appendectomy  1960  . Total abdominal hysterectomy  1969  . Pacemaker  insertion    . Joint replacement      knee replacement     Family History  Problem Relation Age of Onset  . Heart disease Mother   . Leukemia Father      History   Social History  . Marital Status: Married    Spouse Name: N/A    Number of Children: N/A  . Years of Education: N/A   Occupational History  . Not on file.   Social History Main Topics  . Smoking status: Former Research scientist (life sciences)  . Smokeless tobacco: Not on file     Comment: Smoker for 25 years and quit at around age 89  . Alcohol Use: No  . Drug Use: No  . Sexual Activity: Not on file   Other Topics Concern  . Not on file   Social History Narrative   Married, husband's name is [name], they have been married since 94   2 sons and 4 grandchildren           BP 127/70  Pulse 67  Ht 5\' 4"  (1.626 m)  Wt 151 lb 12.8 oz (68.856 kg)  BMI 26.04 kg/m2  Physical Exam:  Well appearing 80 year old woman,NAD  HEENT: Unremarkable Neck:  6 cm JVD, no thyromegally Back:  No CVA tenderness Lungs:  Clear with no wheezes HEART:  Regular rate rhythm, no murmurs, no rubs, no clicks Abd:  soft, positive bowel sounds, no organomegally, no rebound, no guarding Ext:  2 plus pulses, no edema, no cyanosis, no clubbing Skin:  No rashes no nodules Neuro:  CN II through XII intact, motor grossly intact   DEVICE  Normal device function.  See PaceArt for details.   Assess/Plan:

## 2013-09-25 NOTE — Assessment & Plan Note (Signed)
She is maintaining NSR. She will continue her current meds.  

## 2013-09-25 NOTE — Patient Instructions (Addendum)
Continue [facility] follow up every 3 months and follow up with Dr.Taylor in 12 months.  Okay to proceed with hip surgery when necessary

## 2013-09-25 NOTE — Assessment & Plan Note (Signed)
Her Medtr[employer] DDD PM is working normally. Will recheck in several months. 

## 2013-10-01 LAB — MDC_IDC_ENUM_SESS_TYPE_INCLINIC
Battery Impedance: 1300 Ohm
Battery Voltage: 2.78 V
Date Time Interrogation Session: 20150630150544
Implantable Pulse Generator Model: 5156
Lead Channel Pacing Threshold Amplitude: 1 V
Lead Channel Pacing Threshold Pulse Width: 0.4 ms
Lead Channel Sensing Intrinsic Amplitude: 16.6 mV
Lead Channel Setting Sensing Sensitivity: 2 mV
MDC IDC MSMT LEADCHNL RV IMPEDANCE VALUE: 351 Ohm
MDC IDC PG SERIAL: 1664242
MDC IDC SET LEADCHNL RV PACING PULSEWIDTH: 0.4 ms

## 2013-10-11 DIAGNOSIS — I4891 Unspecified atrial fibrillation: Secondary | ICD-10-CM | POA: Diagnosis not present

## 2013-10-11 DIAGNOSIS — Z7901 Long term (current) use of anticoagulants: Secondary | ICD-10-CM | POA: Diagnosis not present

## 2013-10-18 DIAGNOSIS — Z7901 Long term (current) use of anticoagulants: Secondary | ICD-10-CM | POA: Diagnosis not present

## 2013-10-18 DIAGNOSIS — IMO0002 Reserved for concepts with insufficient information to code with codable children: Secondary | ICD-10-CM | POA: Diagnosis not present

## 2013-10-18 DIAGNOSIS — M1990 Unspecified osteoarthritis, unspecified site: Secondary | ICD-10-CM | POA: Diagnosis not present

## 2013-10-18 DIAGNOSIS — Z6826 Body mass index (BMI) 26.0-26.9, adult: Secondary | ICD-10-CM | POA: Diagnosis not present

## 2013-10-18 DIAGNOSIS — E785 Hyperlipidemia, unspecified: Secondary | ICD-10-CM | POA: Diagnosis not present

## 2013-10-18 DIAGNOSIS — I10 Essential (primary) hypertension: Secondary | ICD-10-CM | POA: Diagnosis not present

## 2013-10-18 DIAGNOSIS — I4891 Unspecified atrial fibrillation: Secondary | ICD-10-CM | POA: Diagnosis not present

## 2013-11-02 DIAGNOSIS — M1610 Unilateral primary osteoarthritis, unspecified hip: Secondary | ICD-10-CM | POA: Diagnosis not present

## 2013-11-14 ENCOUNTER — Telehealth: Payer: Self-pay | Admitting: Internal Medicine

## 2013-11-14 NOTE — Telephone Encounter (Signed)
Received request from Nurse fax box, documents faxed for surgical clearance. To: [employer] Fax number: [phone] Attention: [phone]

## 2013-11-15 DIAGNOSIS — Z7901 Long term (current) use of anticoagulants: Secondary | ICD-10-CM | POA: Diagnosis not present

## 2013-11-15 DIAGNOSIS — I4891 Unspecified atrial fibrillation: Secondary | ICD-10-CM | POA: Diagnosis not present

## 2013-11-29 ENCOUNTER — Other Ambulatory Visit: Payer: Self-pay | Admitting: Orthopedic Surgery

## 2013-11-29 NOTE — Progress Notes (Signed)
Preoperative surgical orders have been place into the Epic hospital system for [name] on 11/29/2013, 1:44 PM  by Mickel Crow for surgery on 01/02/2014.  Preop Total Hip - Anterior Approach orders including Experel Injecion, IV Tylenol, and IV Decadron as long as there are no contraindications to the above medications. [name], PA-C

## 2013-12-06 ENCOUNTER — Encounter (HOSPITAL_COMMUNITY): Payer: Self-pay | Admitting: Pharmacy Technician

## 2013-12-06 ENCOUNTER — Other Ambulatory Visit: Payer: Self-pay | Admitting: Orthopedic Surgery

## 2013-12-17 NOTE — Patient Instructions (Addendum)
[name]  12/17/2013                           YOUR PROCEDURE IS SCHEDULED ON:  12/24/13               ENTER THRU [place] MAIN HOSPITAL ENTRANCE AND                            FOLLOW  SIGNS TO SHORT STAY CENTER                 ARRIVE AT SHORT STAY AT:  12:00 PM               CALL THIS NUMBER IF ANY PROBLEMS THE DAY OF SURGERY :               [phone]                                REMEMBER:   Do not eat food AFTER MIDNIGHT -MAY HAVE CLEAR LIQUIDS UNTIL 9:00 AM     CLEAR LIQUID DIET   Foods Allowed                                                                     Foods Excluded  Coffee and tea, regular and decaf                             liquids that you cannot  Plain Jell-O in any flavor                                             see through such as: Fruit ices (not with fruit pulp)                                     milk, soups, orange juice  Iced Popsicles                                    All solid food Carbonated beverages, regular and diet                                    Cranberry, grape and apple juices Sports drinks like Gatorade Lightly seasoned clear broth or consume(fat free) Sugar, honey syrup  _____________________________________________________________________                    Take these medicines the morning of surgery with               A SIPS OF WATER :      AMLODIPINE / CLONIDINE / PRAVASTATIN   Do not wear jewelry, make-up   Do not wear lotions, powders, or perfumes.  Do not shave legs or underarms 12 hrs. before surgery (men may shave face)  Do not bring valuables to the hospital.  Contacts, dentures or bridgework may not be worn into surgery.  Leave suitcase in the car. After surgery it may be brought to your room.  For patients admitted to the hospital more than one night, checkout time is            11:00 AM                                                        ________________________________________________________________________                                                                                                  [name]  Before surgery, you can play an important role.  Because skin is not sterile, your skin needs to be as free of germs as possible.  You can reduce the number of germs on your skin by washing with CHG (chlorahexidine gluconate) soap before surgery.  CHG is an antiseptic cleaner which kills germs and bonds with the skin to continue killing germs even after washing. Please DO NOT use if you have an allergy to CHG or antibacterial soaps.  If your skin becomes reddened/irritated stop using the CHG and inform your nurse when you arrive at Short Stay. Do not shave (including legs and underarms) for at least 48 hours prior to the first CHG shower.  You may shave your face. Please follow these instructions carefully:   1.  Shower with CHG Soap the night before surgery and the  morning of Surgery.   2.  If you choose to wash your hair, wash your hair first as usual with your  normal  Shampoo.   3.  After you shampoo, rinse your hair and body thoroughly to remove the  shampoo.                                         4.  Use CHG as you would any other liquid soap.  You can apply chg directly  to the skin and wash . Gently wash with scrungie or clean wascloth    5.  Apply the CHG Soap to your body ONLY FROM THE NECK DOWN.   Do not use on open                           Wound or open sores. Avoid contact with eyes, ears mouth and genitals (private parts).                        Genitals (private parts) with your normal soap.  6.  Wash thoroughly, paying special attention to the area where your surgery  will be performed.   7.  Thoroughly rinse your body with warm water from the neck down.   8.  DO NOT shower/wash with your normal soap after using and rinsing off  the CHG Soap .                 9.  Pat yourself dry with a clean towel.             10.  Wear clean pajamas.             11.  Place clean sheets on your bed the night of your first shower and do not  sleep with pets.  Day of Surgery : Do not apply any lotions/deodorants the morning of surgery.  Please wear clean clothes to the hospital/surgery center.  FAILURE TO FOLLOW THESE INSTRUCTIONS MAY RESULT IN THE CANCELLATION OF YOUR SURGERY    PATIENT SIGNATURE_________________________________  ______________________________________________________________________     [name]  An incentive spirometer is a tool that can help keep your lungs clear and active. This tool measures how well you are filling your lungs with each breath. Taking long deep breaths may help reverse or decrease the chance of developing breathing (pulmonary) problems (especially infection) following:  A long period of time when you are unable to move or be active. BEFORE THE PROCEDURE   If the spirometer includes an indicator to show your best effort, your nurse or respiratory therapist will set it to a desired goal.  If possible, sit up straight or lean slightly forward. Try not to slouch.  Hold the incentive spirometer in an upright position. INSTRUCTIONS FOR USE  1. Sit on the edge of your bed if possible, or sit up as far as you can in bed or on a chair. 2. Hold the incentive spirometer in an upright position. 3. Breathe out normally. 4. Place the mouthpiece in your mouth and seal your lips tightly around it. 5. Breathe in slowly and as deeply as possible, raising the piston or the ball toward the top of the column. 6. Hold your breath for 3-5 seconds or for as long as possible. Allow the piston or ball to fall to the bottom of the column. 7. Remove the mouthpiece from your mouth and breathe out normally. 8. Rest for a few seconds and repeat Steps 1 through 7 at least 10 times every 1-2 hours when you are awake. Take  your time and take a few normal breaths between deep breaths. 9. The spirometer may include an indicator to show your best effort. Use the indicator as a goal to work toward during each repetition. 10. After each set of 10 deep breaths, practice coughing to be sure your lungs are clear. If you have an incision (the cut made at the time of surgery), support your incision when coughing by placing a pillow or rolled up towels firmly against it. Once you are able to get out of bed, walk around indoors and cough well. You may stop using the incentive spirometer when instructed by your caregiver.  RISKS AND COMPLICATIONS  Take your time so you do not get dizzy or light-headed.  If you are in pain, you may need to take or ask for pain medication before doing incentive spirometry. It is harder to take a deep breath if you are having pain. AFTER USE  Rest and breathe slowly and easily.  It can be  helpful to keep track of a log of your progress. Your caregiver can provide you with a simple table to help with this. If you are using the spirometer at home, follow these instructions: [place] IF:   You are having difficultly using the spirometer.  You have trouble using the spirometer as often as instructed.  Your pain medication is not giving enough relief while using the spirometer.  You develop fever of 100.5 F (38.1 C) or higher. SEEK IMMEDIATE MEDICAL CARE IF:   You cough up bloody sputum that had not been present before.  You develop fever of 102 F (38.9 C) or greater.  You develop worsening pain at or near the incision site. MAKE SURE YOU:   Understand these instructions.  Will watch your condition.  Will get help right away if you are not doing well or get worse. Document Released: 07/26/2006 Document Revised: 06/07/2011 Document Reviewed: 09/26/2006 ExitCare Patient Information 2014 ExitCare,  [place].   ________________________________________________________________________  WHAT IS A BLOOD TRANSFUSION? Blood Transfusion Information  A transfusion is the replacement of blood or some of its parts. Blood is made up of multiple cells which provide different functions.  Red blood cells carry oxygen and are used for blood loss replacement.  White blood cells fight against infection.  Platelets control bleeding.  Plasma helps clot blood.  Other blood products are available for specialized needs, such as hemophilia or other clotting disorders. BEFORE THE TRANSFUSION  Who gives blood for transfusions?   Healthy volunteers who are fully evaluated to make sure their blood is safe. This is blood bank blood. Transfusion therapy is the safest it has ever been in the practice of medicine. Before blood is taken from a donor, a complete history is taken to make sure that person has no history of diseases nor engages in risky social behavior (examples are intravenous drug use or sexual activity with multiple partners). The donor's travel history is screened to minimize risk of transmitting infections, such as malaria. The donated blood is tested for signs of infectious diseases, such as HIV and hepatitis. The blood is then tested to be sure it is compatible with you in order to minimize the chance of a transfusion reaction. If you or a relative donates blood, this is often done in anticipation of surgery and is not appropriate for emergency situations. It takes many days to process the donated blood. RISKS AND COMPLICATIONS Although transfusion therapy is very safe and saves many lives, the main dangers of transfusion include:   Getting an infectious disease.  Developing a transfusion reaction. This is an allergic reaction to something in the blood you were given. Every precaution is taken to prevent this. The decision to have a blood transfusion has been considered carefully by your caregiver  before blood is given. Blood is not given unless the benefits outweigh the risks. AFTER THE TRANSFUSION  Right after receiving a blood transfusion, you will usually feel much better and more energetic. This is especially true if your red blood cells have gotten low (anemic). The transfusion raises the level of the red blood cells which carry oxygen, and this usually causes an energy increase.  The nurse administering the transfusion will monitor you carefully for complications. HOME CARE INSTRUCTIONS  No special instructions are needed after a transfusion. You may find your energy is better. Speak with your caregiver about any limitations on activity for underlying diseases you may have. SEEK MEDICAL CARE IF:   Your condition is not  improving after your transfusion.  You develop redness or irritation at the intravenous (IV) site. SEEK IMMEDIATE MEDICAL CARE IF:  Any of the following symptoms occur over the next 12 hours:  Shaking chills.  You have a temperature by mouth above 102 F (38.9 C), not controlled by medicine.  Chest, back, or muscle pain.  People around you feel you are not acting correctly or are confused.  Shortness of breath or difficulty breathing.  Dizziness and fainting.  You get a rash or develop hives.  You have a decrease in urine output.  Your urine turns a dark color or changes to pink, red, or brown. Any of the following symptoms occur over the next 10 days:  You have a temperature by mouth above 102 F (38.9 C), not controlled by medicine.  Shortness of breath.  Weakness after normal activity.  The white part of the eye turns yellow (jaundice).  You have a decrease in the amount of urine or are urinating less often.  Your urine turns a dark color or changes to pink, red, or brown. Document Released: 03/12/2000 Document Revised: 06/07/2011 Document Reviewed: 10/30/2007 [facility] Patient Information 2014 [place],  [place].  _______________________________________________________________________

## 2013-12-18 ENCOUNTER — Ambulatory Visit (HOSPITAL_COMMUNITY)
Admission: RE | Admit: 2013-12-18 | Discharge: 2013-12-18 | Disposition: A | Discharge status: Home / Self Care | Payer: Medicare Other | Source: Ambulatory Visit | Attending: Orthopedic Surgery | Admitting: Orthopedic Surgery

## 2013-12-18 ENCOUNTER — Ambulatory Visit (HOSPITAL_COMMUNITY): Admission: RE | Admit: 2013-12-18 | Payer: Medicare Other | Source: Ambulatory Visit

## 2013-12-18 ENCOUNTER — Encounter (HOSPITAL_COMMUNITY): Payer: Self-pay

## 2013-12-18 ENCOUNTER — Encounter (HOSPITAL_COMMUNITY)
Admission: RE | Admit: 2013-12-18 | Discharge: 2013-12-18 | Disposition: A | Discharge status: Home / Self Care | Payer: Medicare Other | Source: Ambulatory Visit | Attending: Orthopedic Surgery | Admitting: Orthopedic Surgery

## 2013-12-18 DIAGNOSIS — M1610 Unilateral primary osteoarthritis, unspecified hip: Secondary | ICD-10-CM | POA: Diagnosis not present

## 2013-12-18 DIAGNOSIS — Z01818 Encounter for other preprocedural examination: Secondary | ICD-10-CM | POA: Diagnosis not present

## 2013-12-18 DIAGNOSIS — I4891 Unspecified atrial fibrillation: Secondary | ICD-10-CM | POA: Diagnosis not present

## 2013-12-18 DIAGNOSIS — I10 Essential (primary) hypertension: Secondary | ICD-10-CM | POA: Insufficient documentation

## 2013-12-18 DIAGNOSIS — Z7901 Long term (current) use of anticoagulants: Secondary | ICD-10-CM | POA: Diagnosis not present

## 2013-12-18 HISTORY — DX: Other complications of anesthesia, initial encounter: T88.59XA

## 2013-12-18 HISTORY — DX: Personal history of other malignant neoplasm of skin: Z85.828

## 2013-12-18 HISTORY — DX: Personal history of other diseases of the digestive system: Z87.19

## 2013-12-18 HISTORY — DX: Personal history of urinary calculi: Z87.442

## 2013-12-18 HISTORY — DX: Nausea with vomiting, unspecified: R11.2

## 2013-12-18 HISTORY — DX: Adverse effect of unspecified anesthetic, initial encounter: T41.45XA

## 2013-12-18 HISTORY — DX: Shortness of breath: R06.02

## 2013-12-18 HISTORY — DX: Presence of cardiac pacemaker: Z95.0

## 2013-12-18 HISTORY — DX: Personal history of other medical treatment: Z92.89

## 2013-12-18 HISTORY — DX: Anxiety disorder, unspecified: F41.9

## 2013-12-18 HISTORY — DX: Other specified postprocedural states: Z98.890

## 2013-12-18 HISTORY — DX: Cardiac arrhythmia, unspecified: I49.9

## 2013-12-18 HISTORY — DX: Sleep disorder, unspecified: G47.9

## 2013-12-18 LAB — COMPREHENSIVE METABOLIC PANEL
ALT: 12 U/L (ref 0–35)
ANION GAP: 10 (ref 5–15)
AST: 21 U/L (ref 0–37)
Albumin: 3.6 g/dL (ref 3.5–5.2)
Alkaline Phosphatase: 92 U/L (ref 39–117)
BILIRUBIN TOTAL: 0.7 mg/dL (ref 0.3–1.2)
BUN: 11 mg/dL (ref 6–23)
CALCIUM: 9.5 mg/dL (ref 8.4–10.5)
CHLORIDE: 93 meq/L — AB (ref 96–112)
CO2: 28 meq/L (ref 19–32)
CREATININE: 0.63 mg/dL (ref 0.50–1.10)
GFR, EST NON AFRICAN AMERICAN: 79 mL/min — AB (ref >90)
GLUCOSE: 94 mg/dL (ref 70–99)
Potassium: 4.5 mEq/L (ref 3.7–5.3)
Sodium: 131 mEq/L — ABNORMAL LOW (ref 137–147)
Total Protein: 7.1 g/dL (ref 6.0–8.3)

## 2013-12-18 LAB — CBC
HEMATOCRIT: 38.8 % (ref 36.0–46.0)
HEMOGLOBIN: 13.1 g/dL (ref 12.0–15.0)
MCH: 29.3 pg (ref 26.0–34.0)
MCHC: 33.8 g/dL (ref 30.0–36.0)
MCV: 86.8 fL (ref 78.0–100.0)
Platelets: 304 10*3/uL (ref 150–400)
RBC: 4.47 MIL/uL (ref 3.87–5.11)
RDW: 13.9 % (ref 11.5–15.5)
WBC: 6.4 10*3/uL (ref 4.0–10.5)

## 2013-12-18 LAB — URINALYSIS, ROUTINE W REFLEX MICROSCOPIC
Bilirubin Urine: NEGATIVE
Glucose, UA: NEGATIVE mg/dL
HGB URINE DIPSTICK: NEGATIVE
Ketones, ur: NEGATIVE mg/dL
NITRITE: NEGATIVE
Protein, ur: NEGATIVE mg/dL
SPECIFIC GRAVITY, URINE: 1.013 (ref 1.005–1.030)
UROBILINOGEN UA: 0.2 mg/dL (ref 0.0–1.0)
pH: 7 (ref 5.0–8.0)

## 2013-12-18 LAB — PROTIME-INR
INR: 2.31 — AB (ref 0.00–1.49)
PROTHROMBIN TIME: 25.4 s — AB (ref 11.6–15.2)

## 2013-12-18 LAB — SURGICAL PCR SCREEN
MRSA, PCR: NEGATIVE
STAPHYLOCOCCUS AUREUS: NEGATIVE

## 2013-12-18 LAB — URINE MICROSCOPIC-ADD ON

## 2013-12-18 LAB — ABO/RH: ABO/RH(D): A POS

## 2013-12-18 LAB — APTT: aPTT: 39 seconds — ABNORMAL HIGH (ref 24–37)

## 2013-12-23 ENCOUNTER — Other Ambulatory Visit: Payer: Self-pay | Admitting: Orthopedic Surgery

## 2013-12-23 NOTE — H&P (Signed)
Denise Denise DOB: December 08, 1925 Widowed / Language: English / Race: White Female Date of Admission 12/24/2013 Chief Complaint:  Right Hip Pain History of Present Illness  The patient is a 80 year old female who comes in for a preoperative history and physical. The patient is scheduled for a right total hip arthroplasty (anterior approach) to be performed by Dr. Dione Plover. [name], MD at [facility] on 01/02/2014. The patient is a 80 year old female who presents with a hip problem. The patient is here in referral from Dr. Nelva Bush.The patient reports right hip problems including pain, giving way, stiffness and osteoarthritis symptoms that have been present for year(s). The symptoms began without any known injury.The patient feels as if their symptoms are does feel they are worsening. Symptoms are relieved by non-opioid analgesics (Tylenol ES). Prior to being seen today the patient was previously evaluated in this clinic 2 month(s) ago. Previous treatment for this problem has included corticosteroid injection (IA injection with Dr. Nelva Bush 09/04/13). Note for "Hip problem": She states the last two IA injections with Dr. Nelva Bush have not helped at all. She states she saw her Cardiologist, Dr. Lovena Le. He said she is all clear. Denise Denise is an 80 year old female who comes in today with her Denise Denise on referral from Dr. Nelva Bush ro evaluation of the right hip. She has known right hip arthritis that has been treated conservatively by Dr. Nelva Bush. She has been undergoing I-A injections and the most recent injection did not help as well. She was sent over to see Dr. Wynelle Link to discuss total hip replacement. Denise Denise states that the pain is in the right groin region and has increased and now is radiating down the right thigh some also. She lives by herself but her indepenence has declined due to the hip pain. She had to get a cane about 5-6 months ago to help with her mobility and she has even stopped driving  becuase of the groin pain assoicated with it. She will experience severr pain upon rising after sitting for any length of time and the first few steps are excrutiating. She had felt more unsteady on her feet due to the pain. She feels a little more stable with the cane but still has alot of pain with weight bearing. There is some pain at rest but she finds a lot of difficulty at night trying to get comfortable. She denies any numbness or tingling in that leg. The hip has gotten worse more recently and now she is ready to get the hip fixed. She was subsequently referred over to Dr. Wynelle Link. Unfortunately her hip is hurting at all times now. It is limiting what she can and cannot do. Her recent intraarticular injection did not provide benefit. Intraarticular injections in the past have helped. She is at a stage where the hips are hurting her at all times. She is not utilizing a wheelchair most of the time. She states the pain is worse with activity than it is at rest, but it does hurt at rest also.  She is ready to get the hip fixed. Risks and benefits of the surgery have been discussed with the patient and they elect to proceed with surgery.  There are on active contraindications to upcoming procedure such as ongoing infection or progressive neurological disease.   Allergies No Know Drug Allergies  Intoleranace Morphine Sulfate (PF) *ANALGESICS - OPIOID* Nausea, Vomiting.  Problem List/Past Medical Cervical pain (723.1) Lumbar/Lumbosacral Disc Degeneration (722.52) Osteoarthritis of  right hip, unspecified osteoarthritis type (715.95  M16.11) Hypercholesterolemia Gastroesophageal Reflux Disease Kidney Stone Pneumonia Past History Bronchitis Past History Hypertension Atrial Fibrillation Bradycardia Vertigo Shingles Coronary Artery Disease/Heart Disease Hiatal Hernia Hemorrhoids Diverticulosis Skin Cancer Measles Mumps Menopause  Family History  Diabetes  Mellitus grandmother fathers side Hypertension mother, grandmother mothers side, grandfather mothers side and child Drug / Alcohol Addiction brother Rheumatoid Arthritis grandfather fathers side Cancer father Heart Disease First Degree Relatives. mother Congestive Heart Failure sister, grandmother mothers side and grandfather mothers side Cerebrovascular Accident grandmother fathers side  Social History Drug/Alcohol Rehab (Currently) no Tobacco use Former smoker. 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 (for a nail fungus) Warfarin Sodium (2MG  Tablet, Oral) Active. Potassium Chloride Crys ER (20MEQ Tablet ER, Oral) Active. AmLODIPine Besylate (10MG  Tablet, Oral) Active. Pravastatin Sodium (40MG  Tablet, Oral) Active. Hydrochlorothiazide (25MG  Tablet, Oral) Active. CloNIDine HCl (0.1MG  Tablet, Oral) Active.  Past Surgical History Pacemaker Placement Date: 2007. Automatic Cardiac Defibrillator In Situ Total Knee Replacement - Both Bilateral Lens Implants Date: 2006. Bilateral Cataracts Appendectomy Date: 1961. Cholecystectomy Date: 2007. Hysterectomy Date: 49. Back Surgery Twice  Review of Systems General Not Present- Chills, Fatigue, Fever, Memory Loss, Night Sweats, Weight Gain and Weight Loss. Skin Not Present- Eczema, Hives, Itching, Lesions and Rash. HEENT Present- Hearing Loss. Not Present- Dentures, Double Vision, Headache, Tinnitus and Visual Loss. Respiratory Present- Shortness of breath with exertion. Not  Present- Allergies, Chronic Cough, Coughing up blood and Shortness of breath at rest. Cardiovascular Present- Swelling. Not Present- Chest Pain, Difficulty Breathing Lying Down, Murmur, Palpitations and Racing/skipping heartbeats. Gastrointestinal Not Present- Abdominal Pain, Bloody Stool, Constipation, Diarrhea, Difficulty Swallowing, Heartburn, Jaundice, Loss of appetitie, Nausea and Vomiting. Female Genitourinary Present- Urinary frequency and Urinating at Night. Not Present- Blood in Urine, Discharge, Flank Pain, Incontinence, Painful Urination, Urgency, Urinary Retention and Weak urinary stream. Musculoskeletal Present- Back Pain, Joint Pain, Joint Swelling, Morning Stiffness and Muscle Pain. Not Present- Muscle Weakness and Spasms. Neurological Not Present- Blackout spells, Difficulty with balance, Dizziness, Paralysis, Tremor and Weakness. Psychiatric Not Present- Insomnia.   Vitals Weight: 149 lb Height: 64in Weight was reported by patient. Height was reported by patient. Body Surface Area: 1.75 m Body Mass Index: 25.58 kg/m BP: 132/62 (Sitting, Right Arm, Standard)    Physical Exam  General Mental Status -Alert, cooperative and good historian. General Appearance-pleasant, Not in acute distress. Orientation-Oriented X3. Build & Nutrition-Well nourished and Well developed.  Head and Neck Head-normocephalic, atraumatic . Neck Global Assessment - supple, no bruit auscultated on the right, no bruit auscultated on the left.  Eye Pupil - Bilateral-Regular and Round. Motion - Bilateral-EOMI.  Chest and Lung Exam Inspection Chest Wall - Inspection of the chest wall reveals - Note: Pacemaker in the upper left chest wall area below the left clavicle. Auscultation Breath sounds - clear at anterior chest wall and clear at posterior chest wall. Adventitious sounds - No Adventitious sounds.  Cardiovascular Auscultation Rhythm - Regular rate and rhythm. Heart  Sounds - S1 WNL and S2 WNL. Murmurs & Other Heart Sounds - Auscultation of the heart reveals - No Murmurs.  Abdomen Palpation/Percussion Tenderness - Abdomen is non-tender  to palpation. Rigidity (guarding) - Abdomen is soft. Auscultation Auscultation of the abdomen reveals - Bowel sounds normal.  Female Genitourinary Note: Not done, not pertinent to present illness   Musculoskeletal Note: On exam she is alert and oriented in no apparent distress. Her left hip shows normal range of motion with no discomfort. Right hip has flexion to 95 degrees, rotation in 10 degrees, rotation out 20 degrees, and abduction to 20-30 degrees with pain on range of motion.  RADIOGRAPHS AP of pelvis and lateral of the right hip show bone-on-bone arthritis in the right hip. It has definitely progressed from over one year ago.  Assessment & Plan Osteoarthritis of right hip, unspecified osteoarthritis type (715.95  M16.11) Note:Plan is for a Right Total Hip Replacement - Anterior Approach by Dr. Wynelle Link.  Plan is to go home with family.  PCP - Dr. Osborne Casco - Patient has been seen preoperatively and felt to be stable for surgery. Cards - Dr. Crissie Sickles - Patient has been seen preoperatively and felt to be stable for surgery.  Please note, the patient takes chronic coumadin for atrial fib. She was instructed to stop the coumadin five days prior to surgery. She will be olaced back onto coumadin postop and given Lovnenox bridge until her coumadin is back into a therapuetic range.  The patient will receive topical TXA (tranexamic acid) due to: Heart Disease  Signed electronically by Joelene Millin, III PA-C

## 2013-12-24 ENCOUNTER — Encounter (HOSPITAL_COMMUNITY): Payer: Medicare Other | Admitting: Anesthesiology

## 2013-12-24 ENCOUNTER — Encounter (HOSPITAL_COMMUNITY)
Admission: RE | Disposition: A | Discharge status: Skilled Nursing Facility | Payer: Self-pay | Source: Ambulatory Visit | Attending: Orthopedic Surgery

## 2013-12-24 ENCOUNTER — Inpatient Hospital Stay (HOSPITAL_COMMUNITY)
Admission: RE | Admit: 2013-12-24 | Discharge: 2013-12-27 | DRG: 470 | Disposition: A | Discharge status: Skilled Nursing Facility | Payer: Medicare Other | Source: Ambulatory Visit | Attending: Orthopedic Surgery | Admitting: Orthopedic Surgery

## 2013-12-24 ENCOUNTER — Inpatient Hospital Stay (HOSPITAL_COMMUNITY): Payer: Medicare Other

## 2013-12-24 ENCOUNTER — Inpatient Hospital Stay (HOSPITAL_COMMUNITY): Payer: Medicare Other | Admitting: Anesthesiology

## 2013-12-24 ENCOUNTER — Encounter (HOSPITAL_COMMUNITY): Payer: Self-pay | Admitting: *Deleted

## 2013-12-24 DIAGNOSIS — Z7901 Long term (current) use of anticoagulants: Secondary | ICD-10-CM | POA: Diagnosis not present

## 2013-12-24 DIAGNOSIS — Z6826 Body mass index (BMI) 26.0-26.9, adult: Secondary | ICD-10-CM | POA: Diagnosis not present

## 2013-12-24 DIAGNOSIS — I10 Essential (primary) hypertension: Secondary | ICD-10-CM | POA: Diagnosis present

## 2013-12-24 DIAGNOSIS — R0602 Shortness of breath: Secondary | ICD-10-CM | POA: Diagnosis not present

## 2013-12-24 DIAGNOSIS — M1610 Unilateral primary osteoarthritis, unspecified hip: Secondary | ICD-10-CM | POA: Diagnosis not present

## 2013-12-24 DIAGNOSIS — M169 Osteoarthritis of hip, unspecified: Secondary | ICD-10-CM | POA: Diagnosis present

## 2013-12-24 DIAGNOSIS — Z950 Presence of cardiac pacemaker: Secondary | ICD-10-CM

## 2013-12-24 DIAGNOSIS — Z9889 Other specified postprocedural states: Secondary | ICD-10-CM | POA: Diagnosis not present

## 2013-12-24 DIAGNOSIS — Z8261 Family history of arthritis: Secondary | ICD-10-CM | POA: Diagnosis not present

## 2013-12-24 DIAGNOSIS — I4891 Unspecified atrial fibrillation: Secondary | ICD-10-CM | POA: Diagnosis present

## 2013-12-24 DIAGNOSIS — M25559 Pain in unspecified hip: Secondary | ICD-10-CM | POA: Diagnosis not present

## 2013-12-24 DIAGNOSIS — Z96649 Presence of unspecified artificial hip joint: Secondary | ICD-10-CM | POA: Diagnosis not present

## 2013-12-24 DIAGNOSIS — Z87891 Personal history of nicotine dependence: Secondary | ICD-10-CM | POA: Diagnosis not present

## 2013-12-24 DIAGNOSIS — I499 Cardiac arrhythmia, unspecified: Secondary | ICD-10-CM | POA: Diagnosis not present

## 2013-12-24 DIAGNOSIS — M25551 Pain in right hip: Secondary | ICD-10-CM | POA: Diagnosis not present

## 2013-12-24 DIAGNOSIS — R112 Nausea with vomiting, unspecified: Secondary | ICD-10-CM | POA: Diagnosis not present

## 2013-12-24 DIAGNOSIS — Z961 Presence of intraocular lens: Secondary | ICD-10-CM | POA: Diagnosis present

## 2013-12-24 DIAGNOSIS — Z96641 Presence of right artificial hip joint: Secondary | ICD-10-CM | POA: Diagnosis not present

## 2013-12-24 DIAGNOSIS — F419 Anxiety disorder, unspecified: Secondary | ICD-10-CM | POA: Diagnosis not present

## 2013-12-24 DIAGNOSIS — Z96653 Presence of artificial knee joint, bilateral: Secondary | ICD-10-CM | POA: Diagnosis present

## 2013-12-24 DIAGNOSIS — D62 Acute posthemorrhagic anemia: Secondary | ICD-10-CM | POA: Diagnosis not present

## 2013-12-24 DIAGNOSIS — Z87442 Personal history of urinary calculi: Secondary | ICD-10-CM | POA: Diagnosis not present

## 2013-12-24 DIAGNOSIS — J189 Pneumonia, unspecified organism: Secondary | ICD-10-CM | POA: Diagnosis not present

## 2013-12-24 DIAGNOSIS — Z9289 Personal history of other medical treatment: Secondary | ICD-10-CM

## 2013-12-24 DIAGNOSIS — M1611 Unilateral primary osteoarthritis, right hip: Secondary | ICD-10-CM | POA: Diagnosis present

## 2013-12-24 DIAGNOSIS — T8859XD Other complications of anesthesia, subsequent encounter: Secondary | ICD-10-CM | POA: Diagnosis not present

## 2013-12-24 DIAGNOSIS — Z471 Aftercare following joint replacement surgery: Secondary | ICD-10-CM | POA: Diagnosis not present

## 2013-12-24 DIAGNOSIS — Z85828 Personal history of other malignant neoplasm of skin: Secondary | ICD-10-CM | POA: Diagnosis not present

## 2013-12-24 DIAGNOSIS — M1990 Unspecified osteoarthritis, unspecified site: Secondary | ICD-10-CM | POA: Diagnosis not present

## 2013-12-24 DIAGNOSIS — R001 Bradycardia, unspecified: Secondary | ICD-10-CM | POA: Diagnosis not present

## 2013-12-24 HISTORY — PX: TOTAL HIP ARTHROPLASTY: SHX124

## 2013-12-24 LAB — PROTIME-INR
INR: 1.2 (ref 0.00–1.49)
PROTHROMBIN TIME: 15.2 s (ref 11.6–15.2)

## 2013-12-24 SURGERY — ARTHROPLASTY, HIP, TOTAL, ANTERIOR APPROACH
Anesthesia: Spinal | Site: Hip | Laterality: Right

## 2013-12-24 MED ORDER — FLEET ENEMA 7-19 GM/118ML RE ENEM: 1.0000 | ENEMA | Freq: Once | RECTAL | Status: AC | PRN

## 2013-12-24 MED ORDER — HYDROMORPHONE HCL 1 MG/ML IJ SOLN: 0.5000 mg | INTRAMUSCULAR | Status: DC | PRN

## 2013-12-24 MED ORDER — ACETAMINOPHEN 500 MG PO TABS
1000.0000 mg | ORAL_TABLET | Freq: Four times a day (QID) | ORAL | Status: AC
  Administered 2013-12-24 – 2013-12-25 (×4): 1000 mg via ORAL
  Filled 2013-12-24 (×5): qty 2

## 2013-12-24 MED ORDER — MIDAZOLAM HCL 2 MG/2ML IJ SOLN
INTRAMUSCULAR | Status: AC
  Filled 2013-12-24: qty 2

## 2013-12-24 MED ORDER — ENOXAPARIN SODIUM 40 MG/0.4ML ~~LOC~~ SOLN
40.0000 mg | SUBCUTANEOUS | Status: DC
  Administered 2013-12-25 – 2013-12-27 (×3): 40 mg via SUBCUTANEOUS
  Filled 2013-12-24 (×4): qty 0.4

## 2013-12-24 MED ORDER — SODIUM CHLORIDE 0.9 % IV SOLN
10.0000 mg | INTRAVENOUS | Status: DC | PRN
  Administered 2013-12-24: 5 ug/min via INTRAVENOUS

## 2013-12-24 MED ORDER — CEFAZOLIN SODIUM-DEXTROSE 2-3 GM-% IV SOLR
INTRAVENOUS | Status: AC
  Filled 2013-12-24: qty 50

## 2013-12-24 MED ORDER — TRAMADOL HCL 50 MG PO TABS
50.0000 mg | ORAL_TABLET | Freq: Four times a day (QID) | ORAL | Status: DC | PRN
  Administered 2013-12-25 – 2013-12-27 (×5): 50 mg via ORAL
  Filled 2013-12-24 (×5): qty 1

## 2013-12-24 MED ORDER — SODIUM CHLORIDE 0.9 % IJ SOLN
INTRAMUSCULAR | Status: AC
  Filled 2013-12-24: qty 50

## 2013-12-24 MED ORDER — DEXAMETHASONE SODIUM PHOSPHATE 10 MG/ML IJ SOLN
10.0000 mg | Freq: Once | INTRAMUSCULAR | Status: AC
  Administered 2013-12-25: 10 mg via INTRAVENOUS
  Filled 2013-12-24: qty 1

## 2013-12-24 MED ORDER — DEXAMETHASONE SODIUM PHOSPHATE 10 MG/ML IJ SOLN
10.0000 mg | Freq: Once | INTRAMUSCULAR | Status: AC
  Administered 2013-12-24: 10 mg via INTRAVENOUS

## 2013-12-24 MED ORDER — LIDOCAINE HCL (CARDIAC) 20 MG/ML IV SOLN
INTRAVENOUS | Status: DC | PRN
  Administered 2013-12-24: 50 mg via INTRAVENOUS

## 2013-12-24 MED ORDER — LACTATED RINGERS IV SOLN
INTRAVENOUS | Status: DC | PRN
  Administered 2013-12-24 (×2): via INTRAVENOUS

## 2013-12-24 MED ORDER — DEXTROSE-NACL 5-0.9 % IV SOLN
INTRAVENOUS | Status: DC
  Administered 2013-12-24 – 2013-12-25 (×2): via INTRAVENOUS

## 2013-12-24 MED ORDER — CEFAZOLIN SODIUM-DEXTROSE 2-3 GM-% IV SOLR
2.0000 g | Freq: Four times a day (QID) | INTRAVENOUS | Status: AC
  Administered 2013-12-24 – 2013-12-25 (×2): 2 g via INTRAVENOUS
  Filled 2013-12-24 (×2): qty 50

## 2013-12-24 MED ORDER — AMLODIPINE BESYLATE 10 MG PO TABS
10.0000 mg | ORAL_TABLET | Freq: Every morning | ORAL | Status: DC
  Administered 2013-12-25 – 2013-12-27 (×2): 10 mg via ORAL
  Filled 2013-12-24 (×3): qty 1

## 2013-12-24 MED ORDER — HYDROMORPHONE HCL 2 MG PO TABS
1.0000 mg | ORAL_TABLET | ORAL | Status: DC | PRN
  Administered 2013-12-24 (×2): 1 mg via ORAL
  Filled 2013-12-24 (×2): qty 1

## 2013-12-24 MED ORDER — ACETAMINOPHEN 325 MG PO TABS: 650.0000 mg | ORAL_TABLET | Freq: Four times a day (QID) | ORAL | Status: DC | PRN

## 2013-12-24 MED ORDER — METHOCARBAMOL 500 MG PO TABS
500.0000 mg | ORAL_TABLET | Freq: Four times a day (QID) | ORAL | Status: DC | PRN
  Administered 2013-12-25 (×2): 500 mg via ORAL
  Filled 2013-12-24 (×3): qty 1

## 2013-12-24 MED ORDER — CLONIDINE HCL 0.1 MG PO TABS
0.0500 mg | ORAL_TABLET | Freq: Every day | ORAL | Status: DC
  Administered 2013-12-25: 0.05 mg via ORAL
  Filled 2013-12-24 (×3): qty 0.5

## 2013-12-24 MED ORDER — PHENOL 1.4 % MT LIQD: 1.0000 | OROMUCOSAL | Status: DC | PRN

## 2013-12-24 MED ORDER — PROPOFOL 10 MG/ML IV BOLUS
INTRAVENOUS | Status: DC | PRN
  Administered 2013-12-24 (×2): 10 mg via INTRAVENOUS

## 2013-12-24 MED ORDER — WARFARIN SODIUM 5 MG PO TABS
5.0000 mg | ORAL_TABLET | Freq: Once | ORAL | Status: AC
  Administered 2013-12-24: 5 mg via ORAL
  Filled 2013-12-24: qty 1

## 2013-12-24 MED ORDER — METHOCARBAMOL 1000 MG/10ML IJ SOLN
500.0000 mg | Freq: Four times a day (QID) | INTRAVENOUS | Status: DC | PRN
  Administered 2013-12-24: 500 mg via INTRAVENOUS
  Filled 2013-12-24: qty 5

## 2013-12-24 MED ORDER — LACTATED RINGERS IV SOLN: INTRAVENOUS | Status: DC

## 2013-12-24 MED ORDER — SODIUM CHLORIDE 0.9 % IV SOLN: INTRAVENOUS | Status: DC

## 2013-12-24 MED ORDER — BUPIVACAINE HCL (PF) 0.25 % IJ SOLN
INTRAMUSCULAR | Status: DC | PRN
  Administered 2013-12-24: 20 mL

## 2013-12-24 MED ORDER — BUPIVACAINE LIPOSOME 1.3 % IJ SUSP
INTRAMUSCULAR | Status: DC | PRN
  Administered 2013-12-24: 20 mL

## 2013-12-24 MED ORDER — PROPOFOL INFUSION 10 MG/ML OPTIME
INTRAVENOUS | Status: DC | PRN
  Administered 2013-12-24: 75 ug/kg/min via INTRAVENOUS

## 2013-12-24 MED ORDER — FENTANYL CITRATE 0.05 MG/ML IJ SOLN
INTRAMUSCULAR | Status: DC | PRN
Start: 2013-12-24 — End: 2013-12-24
  Administered 2013-12-24: 50 ug via INTRAVENOUS

## 2013-12-24 MED ORDER — BISACODYL 10 MG RE SUPP: 10.0000 mg | Freq: Every day | RECTAL | Status: DC | PRN

## 2013-12-24 MED ORDER — LACTATED RINGERS IV SOLN
INTRAVENOUS | Status: DC
  Administered 2013-12-24: 1000 mL via INTRAVENOUS

## 2013-12-24 MED ORDER — SODIUM CHLORIDE 0.9 % IV SOLN: 30.0000 ug/min | INTRAVENOUS | Status: DC

## 2013-12-24 MED ORDER — SODIUM CHLORIDE 0.9 % IR SOLN
Status: DC | PRN
  Administered 2013-12-24: 1000 mL

## 2013-12-24 MED ORDER — FENTANYL CITRATE 0.05 MG/ML IJ SOLN
INTRAMUSCULAR | Status: AC
  Filled 2013-12-24: qty 2

## 2013-12-24 MED ORDER — MENTHOL 3 MG MT LOZG: 1.0000 | LOZENGE | OROMUCOSAL | Status: DC | PRN

## 2013-12-24 MED ORDER — SODIUM CHLORIDE 0.9 % IJ SOLN
INTRAMUSCULAR | Status: DC | PRN
  Administered 2013-12-24: 30 mL

## 2013-12-24 MED ORDER — HYDROCHLOROTHIAZIDE 25 MG PO TABS
25.0000 mg | ORAL_TABLET | Freq: Every morning | ORAL | Status: DC
  Administered 2013-12-25 – 2013-12-27 (×3): 25 mg via ORAL
  Filled 2013-12-24 (×3): qty 1

## 2013-12-24 MED ORDER — DOCUSATE SODIUM 100 MG PO CAPS
100.0000 mg | ORAL_CAPSULE | Freq: Two times a day (BID) | ORAL | Status: DC
  Administered 2013-12-24 – 2013-12-27 (×6): 100 mg via ORAL

## 2013-12-24 MED ORDER — CLONIDINE HCL 0.1 MG PO TABS
0.1000 mg | ORAL_TABLET | Freq: Every day | ORAL | Status: DC
Start: 2013-12-24 — End: 2013-12-27
  Administered 2013-12-25 – 2013-12-26 (×2): 0.1 mg via ORAL
  Filled 2013-12-24 (×4): qty 1

## 2013-12-24 MED ORDER — ONDANSETRON HCL 4 MG PO TABS
4.0000 mg | ORAL_TABLET | Freq: Four times a day (QID) | ORAL | Status: DC | PRN
  Administered 2013-12-27: 4 mg via ORAL
  Filled 2013-12-24: qty 1

## 2013-12-24 MED ORDER — PROMETHAZINE HCL 25 MG/ML IJ SOLN: 6.2500 mg | INTRAMUSCULAR | Status: DC | PRN

## 2013-12-24 MED ORDER — PROPOFOL 10 MG/ML IV BOLUS
INTRAVENOUS | Status: AC
  Filled 2013-12-24: qty 20

## 2013-12-24 MED ORDER — CLONAZEPAM 0.5 MG PO TABS
0.5000 mg | ORAL_TABLET | Freq: Two times a day (BID) | ORAL | Status: DC | PRN
  Administered 2013-12-25 – 2013-12-27 (×2): 0.5 mg via ORAL
  Filled 2013-12-24 (×2): qty 1

## 2013-12-24 MED ORDER — ONDANSETRON HCL 4 MG/2ML IJ SOLN
4.0000 mg | Freq: Four times a day (QID) | INTRAMUSCULAR | Status: DC | PRN
  Administered 2013-12-24 – 2013-12-26 (×2): 4 mg via INTRAVENOUS
  Filled 2013-12-24 (×2): qty 2

## 2013-12-24 MED ORDER — VALACYCLOVIR HCL 500 MG PO TABS: 1000.0000 mg | ORAL_TABLET | Freq: Two times a day (BID) | ORAL | Status: DC

## 2013-12-24 MED ORDER — CHLORHEXIDINE GLUCONATE 4 % EX LIQD: 60.0000 mL | Freq: Once | CUTANEOUS | Status: DC

## 2013-12-24 MED ORDER — FENTANYL CITRATE 0.05 MG/ML IJ SOLN: 25.0000 ug | INTRAMUSCULAR | Status: DC | PRN

## 2013-12-24 MED ORDER — POLYETHYLENE GLYCOL 3350 17 G PO PACK: 17.0000 g | PACK | Freq: Every day | ORAL | Status: DC | PRN

## 2013-12-24 MED ORDER — MIDAZOLAM HCL 5 MG/5ML IJ SOLN
INTRAMUSCULAR | Status: DC | PRN
  Administered 2013-12-24 (×2): 0.5 mg via INTRAVENOUS

## 2013-12-24 MED ORDER — TRANEXAMIC ACID 100 MG/ML IV SOLN
2000.0000 mg | Freq: Once | INTRAVENOUS | Status: DC
  Filled 2013-12-24: qty 20

## 2013-12-24 MED ORDER — CLONIDINE HCL 0.1 MG PO TABS: 0.1000 mg | ORAL_TABLET | ORAL | Status: DC

## 2013-12-24 MED ORDER — CEFAZOLIN SODIUM-DEXTROSE 2-3 GM-% IV SOLR
2.0000 g | INTRAVENOUS | Status: AC
  Administered 2013-12-24: 2 g via INTRAVENOUS

## 2013-12-24 MED ORDER — MEPERIDINE HCL 50 MG/ML IJ SOLN: 6.2500 mg | INTRAMUSCULAR | Status: DC | PRN

## 2013-12-24 MED ORDER — POTASSIUM CHLORIDE CRYS ER 20 MEQ PO TBCR
20.0000 meq | EXTENDED_RELEASE_TABLET | Freq: Every morning | ORAL | Status: DC
  Administered 2013-12-25 – 2013-12-27 (×3): 20 meq via ORAL
  Filled 2013-12-24 (×3): qty 1

## 2013-12-24 MED ORDER — ACETAMINOPHEN 10 MG/ML IV SOLN
1000.0000 mg | Freq: Once | INTRAVENOUS | Status: AC
  Administered 2013-12-24: 1000 mg via INTRAVENOUS
  Filled 2013-12-24: qty 100

## 2013-12-24 MED ORDER — BUPIVACAINE LIPOSOME 1.3 % IJ SUSP
20.0000 mL | Freq: Once | INTRAMUSCULAR | Status: DC
  Filled 2013-12-24: qty 20

## 2013-12-24 MED ORDER — BUPIVACAINE IN DEXTROSE 0.75-8.25 % IT SOLN
INTRATHECAL | Status: DC | PRN
  Administered 2013-12-24: 15 mg via INTRATHECAL

## 2013-12-24 MED ORDER — DIPHENHYDRAMINE HCL 12.5 MG/5ML PO ELIX
12.5000 mg | ORAL_SOLUTION | ORAL | Status: DC | PRN
  Administered 2013-12-25: 12.5 mg via ORAL
  Filled 2013-12-24: qty 5

## 2013-12-24 MED ORDER — ACETAMINOPHEN 650 MG RE SUPP: 650.0000 mg | Freq: Four times a day (QID) | RECTAL | Status: DC | PRN

## 2013-12-24 MED ORDER — WARFARIN - PHARMACIST DOSING INPATIENT: Freq: Every day | Status: DC

## 2013-12-24 MED ORDER — VALACYCLOVIR HCL 500 MG PO TABS
1000.0000 mg | ORAL_TABLET | Freq: Two times a day (BID) | ORAL | Status: DC
  Administered 2013-12-24 – 2013-12-25 (×2): 1000 mg via ORAL
  Administered 2013-12-25: 500 mg via ORAL
  Administered 2013-12-26 – 2013-12-27 (×3): 1000 mg via ORAL
  Filled 2013-12-24 (×7): qty 2

## 2013-12-24 MED ORDER — METOCLOPRAMIDE HCL 5 MG/ML IJ SOLN: 5.0000 mg | Freq: Three times a day (TID) | INTRAMUSCULAR | Status: DC | PRN

## 2013-12-24 MED ORDER — BUPIVACAINE HCL (PF) 0.25 % IJ SOLN
INTRAMUSCULAR | Status: AC
  Filled 2013-12-24: qty 30

## 2013-12-24 MED ORDER — METOCLOPRAMIDE HCL 10 MG PO TABS: 5.0000 mg | ORAL_TABLET | Freq: Three times a day (TID) | ORAL | Status: DC | PRN

## 2013-12-24 MED ORDER — IRBESARTAN 300 MG PO TABS
300.0000 mg | ORAL_TABLET | Freq: Every morning | ORAL | Status: DC
  Administered 2013-12-25: 300 mg via ORAL
  Filled 2013-12-24 (×3): qty 1

## 2013-12-24 SURGICAL SUPPLY — 40 items
BAG SPEC THK2 15X12 ZIP CLS (MISCELLANEOUS)
BAG ZIPLOCK 12X15 (MISCELLANEOUS) IMPLANT
BLADE EXTENDED COATED 6.5IN (ELECTRODE) ×3 IMPLANT
BLADE SAG 18X100X1.27 (BLADE) ×3 IMPLANT
CAPT HIP PF MOP ×3 IMPLANT
CLOSURE WOUND 1/2 X4 (GAUZE/BANDAGES/DRESSINGS) ×1
COVER PERINEAL POST (MISCELLANEOUS) ×3 IMPLANT
DECANTER SPIKE VIAL GLASS SM (MISCELLANEOUS) ×3 IMPLANT
DRAPE C-ARM 42X120 X-RAY (DRAPES) ×3 IMPLANT
DRAPE STERI IOBAN 125X83 (DRAPES) ×3 IMPLANT
DRAPE U-SHAPE 47X51 STRL (DRAPES) ×9 IMPLANT
DRSG ADAPTIC 3X8 NADH LF (GAUZE/BANDAGES/DRESSINGS) ×3 IMPLANT
DRSG MEPILEX BORDER 4X4 (GAUZE/BANDAGES/DRESSINGS) ×3 IMPLANT
DRSG MEPILEX BORDER 4X8 (GAUZE/BANDAGES/DRESSINGS) ×3 IMPLANT
DURAPREP 26ML APPLICATOR (WOUND CARE) ×3 IMPLANT
ELECT REM PT RETURN 9FT ADLT (ELECTROSURGICAL) ×3
ELECTRODE REM PT RTRN 9FT ADLT (ELECTROSURGICAL) ×1 IMPLANT
EVACUATOR 1/8 PVC DRAIN (DRAIN) ×3 IMPLANT
FACESHIELD WRAPAROUND (MASK) ×12 IMPLANT
GAUZE SPONGE 4X4 12PLY STRL (GAUZE/BANDAGES/DRESSINGS) IMPLANT
GLOVE BIO SURGEON STRL SZ7.5 (GLOVE) ×18 IMPLANT
GLOVE BIO SURGEON STRL SZ8 (GLOVE) ×6 IMPLANT
GLOVE BIOGEL PI IND STRL 8 (GLOVE) ×2 IMPLANT
GLOVE BIOGEL PI INDICATOR 8 (GLOVE) ×4
GOWN STRL REUS W/TWL LRG LVL3 (GOWN DISPOSABLE) ×3 IMPLANT
GOWN STRL REUS W/TWL XL LVL3 (GOWN DISPOSABLE) ×9 IMPLANT
KIT BASIN OR (CUSTOM PROCEDURE TRAY) ×3 IMPLANT
NDL SAFETY ECLIPSE 18X1.5 (NEEDLE) ×2 IMPLANT
NEEDLE HYPO 18GX1.5 SHARP (NEEDLE) ×6
PACK TOTAL JOINT (CUSTOM PROCEDURE TRAY) ×3 IMPLANT
STRIP CLOSURE SKIN 1/2X4 (GAUZE/BANDAGES/DRESSINGS) ×2 IMPLANT
SUT ETHIBOND NAB CT1 #1 30IN (SUTURE) ×3 IMPLANT
SUT MNCRL AB 4-0 PS2 18 (SUTURE) ×3 IMPLANT
SUT VIC AB 2-0 CT1 27 (SUTURE) ×4
SUT VIC AB 2-0 CT1 TAPERPNT 27 (SUTURE) ×2 IMPLANT
SUT VLOC 180 0 24IN GS25 (SUTURE) ×3 IMPLANT
SYR 20CC LL (SYRINGE) ×3 IMPLANT
SYR 50ML LL SCALE MARK (SYRINGE) ×6 IMPLANT
TOWEL OR 17X26 10 PK STRL BLUE (TOWEL DISPOSABLE) ×3 IMPLANT
TRAY FOLEY CATH 14FRSI W/METER (CATHETERS) ×3 IMPLANT

## 2013-12-24 NOTE — Progress Notes (Signed)
Utilization review completed.  

## 2013-12-24 NOTE — Anesthesia Procedure Notes (Signed)
Spinal  Patient location during procedure: OR Start time: 12/24/2013 1:22 PM End time: 12/24/2013 1:28 PM Staffing CRNA/Resident: Sherian Maroon A Performed by: resident/CRNA  Preanesthetic Checklist Completed: patient identified, site marked, surgical consent, pre-op evaluation, timeout performed, IV checked, risks and benefits discussed and monitors and equipment checked Spinal Block Patient position: sitting Prep: Betadine Patient monitoring: heart rate, cardiac monitor and continuous pulse ox Approach: midline Location: L3-4 Injection technique: single-shot Needle Needle type: Spinocan  Needle gauge: 22 G Needle length: 9 cm Needle insertion depth: 5 cm Assessment Sensory level: T4

## 2013-12-24 NOTE — Anesthesia Preprocedure Evaluation (Signed)
Anesthesia Evaluation  Patient identified by MRN, date of birth, ID band Patient awake    Reviewed: Allergy & Precautions, H&P , NPO status , Patient's Chart, lab work & pertinent test results  History of Anesthesia Complications (+) PONV  Airway Mallampati: II TM Distance: >3 FB Neck ROM: Full    Dental no notable dental hx.    Pulmonary former smoker,  breath sounds clear to auscultation  Pulmonary exam normal       Cardiovascular hypertension, Pt. on medications + dysrhythmias Atrial Fibrillation + pacemaker Rhythm:Regular Rate:Normal     Neuro/Psych negative neurological ROS  negative psych ROS   GI/Hepatic negative GI ROS, Neg liver ROS, hiatal hernia,   Endo/Other  negative endocrine ROS  Renal/GU negative Renal ROS  negative genitourinary   Musculoskeletal negative musculoskeletal ROS (+)   Abdominal   Peds negative pediatric ROS (+)  Hematology negative hematology ROS (+)   Anesthesia Other Findings   Reproductive/Obstetrics negative OB ROS                           Anesthesia Physical Anesthesia Plan  ASA: III  Anesthesia Plan: Spinal   Post-op Pain Management:    Induction:   Airway Management Planned: Simple Face Mask  Additional Equipment:   Intra-op Plan:   Post-operative Plan:   Informed Consent: I have reviewed the patients History and Physical, chart, labs and discussed the procedure including the risks, benefits and alternatives for the proposed anesthesia with the patient or authorized representative who has indicated his/her understanding and acceptance.   Dental advisory given  Plan Discussed with: CRNA  Anesthesia Plan Comments:         Anesthesia Quick Evaluation

## 2013-12-24 NOTE — Progress Notes (Addendum)
ANTICOAGULATION CONSULT NOTE - Initial Consult  Pharmacy Consult for Warfarin Indication: atrial fibrillation and VTE prophylaxis  Allergies  Allergen Reactions  . Morphine And Related Nausea And Vomiting    Patient Measurements: Height: 5\' 4"  (162.6 cm) Weight: 152 lb (68.947 kg) IBW/kg (Calculated) : 54.7  Vital Signs: Temp: 97.6 F (36.4 C) (09/28 1645) Temp src: Oral (09/28 1645) BP: 150/61 mmHg (09/28 1645) Pulse Rate: 68 (09/28 1645)  Labs:  Recent Labs  12/24/13 1021  LABPROT 15.2  INR 1.20    Estimated Creatinine Clearance: 48.1 ml/min (by C-G formula based on Cr of 0.63).   Medical History: Past Medical History  Diagnosis Date  . Pneumonia   . Osteoarthritis   . Hypertension   . Atrial fibrillation   . Bradycardia   . Complication of anesthesia   . PONV (postoperative nausea and vomiting)   . Dysrhythmia     A FIB  . Pacemaker   . Shortness of breath     WITH EXERTION  . History of skin cancer   . H/O hiatal hernia   . History of kidney stones   . History of transfusion   . Difficulty sleeping   . Anxiety     Medications:  Scheduled:  . acetaminophen  1,000 mg Oral 4 times per day  . [START ON 12/25/2013] amLODipine  10 mg Oral q morning - 10a  .  ceFAZolin (ANCEF) IV  2 g Intravenous Q6H  . [START ON 12/25/2013] cloNIDine  0.05 mg Oral Daily  . cloNIDine  0.1 mg Oral QHS  . [START ON 12/25/2013] dexamethasone  10 mg Intravenous Once  . docusate sodium  100 mg Oral BID  . [START ON 12/25/2013] enoxaparin (LOVENOX) injection  40 mg Subcutaneous Q24H  . [START ON 12/25/2013] hydrochlorothiazide  25 mg Oral q morning - 10a  . [START ON 12/25/2013] irbesartan  300 mg Oral q morning - 10a  . [START ON 12/25/2013] potassium chloride SA  20 mEq Oral q morning - 10a  . valACYclovir  1,000 mg Oral BID   Infusions:  . dextrose 5 % and 0.9% NaCl      Assessment: 41 yoF s/p Right total hip arthroplasty, anterior approach on 9/28.  PMH is significant  for atrial fibrillation on chronic warfarin.  She was taking 2mg  daily except 4mg  on Mon and Thursdays; last dose on 9/22.  Prophylactic Lovenox per MD.  Pharmacy is consulted to dose warfarin.  Today, 9/28  INR 1.2  Preop CBC (9/22): Hgb 13.1, Plt 304  Diet: clear liquids  No major drug-drug interactions identified  Goal of Therapy:  INR 2-3 Monitor platelets by anticoagulation protocol: Yes   Plan:   Warfarin 5mg  PO tonight at 2000  Continue Lovenox 40mg  SubQ q24h until INR is therapeutic.  Daily INR and CBC  Gretta Arab PharmD, BCPS Pager [phone] 12/24/2013 5:39 PM

## 2013-12-24 NOTE — Op Note (Signed)
OPERATIVE REPORT  PREOPERATIVE DIAGNOSIS: Osteoarthritis of the Right hip.   POSTOPERATIVE DIAGNOSIS: Osteoarthritis of the Right  hip.   PROCEDURE: Right total hip arthroplasty, anterior approach.   SURGEON: Gaynelle Arabian, MD   ASSISTANT: [name], PA-C  ANESTHESIA:  Spinal  ESTIMATED BLOOD LOSS:- 350 ml  DRAINS: Hemovac x1.   COMPLICATIONS: None   CONDITION: PACU - hemodynamically stable.   BRIEF CLINICAL NOTE: [name] is a 80 y.o. female who has advanced end-  stage arthritis of her Right  hip with progressively worsening pain and  dysfunction.The patient has failed nonoperative management and presents for  total hip arthroplasty.   PROCEDURE IN DETAIL: After successful administration of spinal  anesthetic, the traction boots for the [facility] bed were placed on both  feet and the patient was placed onto the [facility] bed, boots placed into the leg  holders. The Right hip was then isolated from the perineum with plastic  drapes and prepped and draped in the usual sterile fashion. ASIS and  greater trochanter were marked and a oblique incision was made, starting  at about 1 cm lateral and 2 cm distal to the ASIS and coursing towards  the anterior cortex of the femur. The skin was cut with a 10 blade  through subcutaneous tissue to the level of the fascia overlying the  tensor fascia lata muscle. The fascia was then incised in line with the  incision at the junction of the anterior third and posterior 2/3rd. The  muscle was teased off the fascia and then the interval between the TFL  and the rectus was developed. The Hohmann retractor was then placed at  the top of the femoral neck over the capsule. The vessels overlying the  capsule were cauterized and the fat on top of the capsule was removed.  A Hohmann retractor was then placed anterior underneath the rectus  femoris to give exposure to the entire anterior capsule. A T-shaped  capsulotomy was  performed. The edges were tagged and the femoral head  was identified.       Osteophytes are removed off the superior acetabulum.  The femoral neck was then cut in situ with an oscillating saw. Traction  was then applied to the left lower extremity utilizing the [facility]  traction. The femoral head was then removed. Retractors were placed  around the acetabulum and then circumferential removal of the labrum was  performed. Osteophytes were also removed. Reaming starts at 45 mm to  medialize and  Increased in 2 mm increments to 49 mm. We reamed in  approximately 40 degrees of abduction, 20 degrees anteversion. A 50 mm  pinnacle acetabular shell was then impacted in anatomic position under  fluoroscopic guidance with excellent purchase. We did not need to place  any additional dome screws. A 32 mm neutral + 4 marathon liner was then  placed into the acetabular shell.       The femoral lift was then placed along the lateral aspect of the femur  just distal to the vastus ridge. The leg was  externally rotated and capsule  was stripped off the inferior aspect of the femoral neck down to the  level of the lesser trochanter, this was done with electrocautery. The femur was lifted after this was performed. The  leg was then placed and extended in adducted position to essentially delivering the femur. We also removed the capsule superiorly and the  piriformis from the piriformis  fossa to gain excellent exposure of the  proximal femur. Rongeur was used to remove some cancellous bone to get  into the lateral portion of the proximal femur for placement of the  initial starter reamer. The starter broaches was placed  the starter broach  and was shown to go down the center of the canal. Broaching  with the  Corail system was then performed starting at size 8, coursing  Up to size 10. A size 10 had excellent torsional and rotational  and axial stability. The trial standard offset neck was then placed  with a  32 + 1 trial head. The hip was then reduced. We confirmed that  the stem was in the canal both on AP and lateral x-rays. It also has excellent sizing. The hip was reduced with outstanding stability through full extension, full external rotation,  and then flexion in adduction internal rotation. AP pelvis was taken  and the leg lengths were measured and found to be exactly equal. Hip  was then dislocated again and the femoral head and neck removed. The  femoral broach was removed. Size 10 Corail stem with a standard offset  neck was then impacted into the femur following native anteversion. Has  excellent purchase in the canal. Excellent torsional and rotational and  axial stability. It is confirmed to be in the canal on AP and lateral  fluoroscopic views. The 32 + 1 metal head was placed and the hip  reduced with outstanding stability. Again AP pelvis was taken and it  confirmed that the leg lengths were equal. The wound was then copiously  irrigated with saline solution and the capsule reattached and repaired  with Ethibond suture.  20 mL of Exparel mixed with 50 mL of saline then additional 20 ml of .25% Bupivicaine injected into the capsule and into the edge of the tensor fascia lata as well as subcutaneous tissue. The fascia overlying the tensor fascia lata was  then closed with a running #1 V-Loc. Subcu was closed with interrupted  2-0 Vicryl and subcuticular running 4-0 Monocryl. Incision was cleaned  and dried. Steri-Strips and a bulky sterile dressing applied. Hemovac  drain was hooked to suction and then he was awakened and transported to  recovery in stable condition.        Please note that a surgical assistant was a medical necessity for this procedure to perform it in a safe and expeditious manner. Assistant was necessary to provide appropriate retraction of vital neurovascular structures and to prevent femoral fracture and allow for anatomic placement of the prosthesis.  Gaynelle Arabian, M.D.

## 2013-12-24 NOTE — OR Nursing (Signed)
Dr Wynelle Link injected tranexamic acid 2000 mg in 33ml 0.9% saline to right hip

## 2013-12-24 NOTE — Interval H&P Note (Signed)
History and Physical Interval Note:  12/24/2013 12:59 PM  [name]  has presented today for surgery, with the diagnosis of OA OF RIGHT HIP  The various methods of treatment have been discussed with the patient and family. After consideration of risks, benefits and other options for treatment, the patient has consented to  Procedure(s): RIGHT TOTAL HIP ARTHROPLASTY ANTERIOR APPROACH (Right) as a surgical intervention .  The patient's history has been reviewed, patient examined, no change in status, stable for surgery.  I have reviewed the patient's chart and labs.  Questions were answered to the patient's satisfaction.     Gearlean Alf

## 2013-12-24 NOTE — H&P (View-Only) (Signed)
[name] DOB: 1925/06/12 Widowed / Language: English / Race: White Female Date of Admission 12/24/2013 Chief Complaint:  Right Hip Pain History of Present Illness  The patient is a 80 year old female who comes in for a preoperative history and physical. The patient is scheduled for a right total hip arthroplasty (anterior approach) to be performed by Dr. Dione Plover. [name], MD at [facility] on 01/02/2014. The patient is a 80 year old female who presents with a hip problem. The patient is here in referral from Dr. Nelva Bush.The patient reports right hip problems including pain, giving way, stiffness and osteoarthritis symptoms that have been present for year(s). The symptoms began without any known injury.The patient feels as if their symptoms are does feel they are worsening. Symptoms are relieved by non-opioid analgesics (Tylenol ES). Prior to being seen today the patient was previously evaluated in this clinic 2 month(s) ago. Previous treatment for this problem has included corticosteroid injection (IA injection with Dr. Nelva Bush 09/04/13). Note for "Hip problem": She states the last two IA injections with Dr. Nelva Bush have not helped at all. She states she saw her Cardiologist, Dr. Lovena Le. He said she is all clear. [name] is an 80 year old female who comes in today with her son [name] on referral from Dr. Nelva Bush ro evaluation of the right hip. She has known right hip arthritis that has been treated conservatively by Dr. Nelva Bush. She has been undergoing I-A injections and the most recent injection did not help as well. She was sent over to see Dr. Wynelle Link to discuss total hip replacement. [name] states that the pain is in the right groin region and has increased and now is radiating down the right thigh some also. She lives by herself but her indepenence has declined due to the hip pain. She had to get a cane about 5-6 months ago to help with her mobility and she has even stopped driving  becuase of the groin pain assoicated with it. She will experience severr pain upon rising after sitting for any length of time and the first few steps are excrutiating. She had felt more unsteady on her feet due to the pain. She feels a little more stable with the cane but still has alot of pain with weight bearing. There is some pain at rest but she finds a lot of difficulty at night trying to get comfortable. She denies any numbness or tingling in that leg. The hip has gotten worse more recently and now she is ready to get the hip fixed. She was subsequently referred over to Dr. Wynelle Link. Unfortunately her hip is hurting at all times now. It is limiting what she can and cannot do. Her recent intraarticular injection did not provide benefit. Intraarticular injections in the past have helped. She is at a stage where the hips are hurting her at all times. She is not utilizing a wheelchair most of the time. She states the pain is worse with activity than it is at rest, but it does hurt at rest also.  She is ready to get the hip fixed. Risks and benefits of the surgery have been discussed with the patient and they elect to proceed with surgery.  There are on active contraindications to upcoming procedure such as ongoing infection or progressive neurological disease.   Allergies No Know Drug Allergies  Intoleranace Morphine Sulfate (PF) *ANALGESICS - OPIOID* Nausea, Vomiting.  Problem List/Past Medical Cervical pain (723.1) Lumbar/Lumbosacral Disc Degeneration (722.52) Osteoarthritis of  right hip, unspecified osteoarthritis type (715.95  M16.11) Hypercholesterolemia Gastroesophageal Reflux Disease Kidney Stone Pneumonia Past History Bronchitis Past History Hypertension Atrial Fibrillation Bradycardia Vertigo Shingles Coronary Artery Disease/Heart Disease Hiatal Hernia Hemorrhoids Diverticulosis Skin Cancer Measles Mumps Menopause  Family History  Diabetes  Mellitus grandmother fathers side Hypertension mother, grandmother mothers side, grandfather mothers side and child Drug / Alcohol Addiction brother Rheumatoid Arthritis grandfather fathers side Cancer father Heart Disease First Degree Relatives. mother Congestive Heart Failure sister, grandmother mothers side and grandfather mothers side Cerebrovascular Accident grandmother fathers side  Social History Drug/Alcohol Rehab (Currently) no Tobacco use Former smoker. 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 (for a nail fungus) Warfarin Sodium (2MG  Tablet, Oral) Active. Potassium Chloride Crys ER (20MEQ Tablet ER, Oral) Active. AmLODIPine Besylate (10MG  Tablet, Oral) Active. Pravastatin Sodium (40MG  Tablet, Oral) Active. Hydrochlorothiazide (25MG  Tablet, Oral) Active. CloNIDine HCl (0.1MG  Tablet, Oral) Active.  Past Surgical History Pacemaker Placement Date: 2007. Automatic Cardiac Defibrillator In Situ Total Knee Replacement - Both Bilateral Lens Implants Date: 2006. Bilateral Cataracts Appendectomy Date: 1961. Cholecystectomy Date: 2007. Hysterectomy Date: 55. Back Surgery Twice  Review of Systems General Not Present- Chills, Fatigue, Fever, Memory Loss, Night Sweats, Weight Gain and Weight Loss. Skin Not Present- Eczema, Hives, Itching, Lesions and Rash. HEENT Present- Hearing Loss. Not Present- Dentures, Double Vision, Headache, Tinnitus and Visual Loss. Respiratory Present- Shortness of breath with exertion. Not  Present- Allergies, Chronic Cough, Coughing up blood and Shortness of breath at rest. Cardiovascular Present- Swelling. Not Present- Chest Pain, Difficulty Breathing Lying Down, Murmur, Palpitations and Racing/skipping heartbeats. Gastrointestinal Not Present- Abdominal Pain, Bloody Stool, Constipation, Diarrhea, Difficulty Swallowing, Heartburn, Jaundice, Loss of appetitie, Nausea and Vomiting. Female Genitourinary Present- Urinary frequency and Urinating at Night. Not Present- Blood in Urine, Discharge, Flank Pain, Incontinence, Painful Urination, Urgency, Urinary Retention and Weak urinary stream. Musculoskeletal Present- Back Pain, Joint Pain, Joint Swelling, Morning Stiffness and Muscle Pain. Not Present- Muscle Weakness and Spasms. Neurological Not Present- Blackout spells, Difficulty with balance, Dizziness, Paralysis, Tremor and Weakness. Psychiatric Not Present- Insomnia.   Vitals Weight: 149 lb Height: 64in Weight was reported by patient. Height was reported by patient. Body Surface Area: 1.75 m Body Mass Index: 25.58 kg/m BP: 132/62 (Sitting, Right Arm, Standard)    Physical Exam  General Mental Status -Alert, cooperative and good historian. General Appearance-pleasant, Not in acute distress. Orientation-Oriented X3. Build & Nutrition-Well nourished and Well developed.  Head and Neck Head-normocephalic, atraumatic . Neck Global Assessment - supple, no bruit auscultated on the right, no bruit auscultated on the left.  Eye Pupil - Bilateral-Regular and Round. Motion - Bilateral-EOMI.  Chest and Lung Exam Inspection Chest Wall - Inspection of the chest wall reveals - Note: Pacemaker in the upper left chest wall area below the left clavicle. Auscultation Breath sounds - clear at anterior chest wall and clear at posterior chest wall. Adventitious sounds - No Adventitious sounds.  Cardiovascular Auscultation Rhythm - Regular rate and rhythm. Heart  Sounds - S1 WNL and S2 WNL. Murmurs & Other Heart Sounds - Auscultation of the heart reveals - No Murmurs.  Abdomen Palpation/Percussion Tenderness - Abdomen is non-tender  to palpation. Rigidity (guarding) - Abdomen is soft. Auscultation Auscultation of the abdomen reveals - Bowel sounds normal.  Female Genitourinary Note: Not done, not pertinent to present illness   Musculoskeletal Note: On exam she is alert and oriented in no apparent distress. Her left hip shows normal range of motion with no discomfort. Right hip has flexion to 95 degrees, rotation in 10 degrees, rotation out 20 degrees, and abduction to 20-30 degrees with pain on range of motion.  RADIOGRAPHS AP of pelvis and lateral of the right hip show bone-on-bone arthritis in the right hip. It has definitely progressed from over one year ago.  Assessment & Plan Osteoarthritis of right hip, unspecified osteoarthritis type (715.95  M16.11) Note:Plan is for a Right Total Hip Replacement - Anterior Approach by Dr. Wynelle Link.  Plan is to go home with family.  PCP - Dr. Osborne Casco - Patient has been seen preoperatively and felt to be stable for surgery. Cards - Dr. Crissie Sickles - Patient has been seen preoperatively and felt to be stable for surgery.  Please note, the patient takes chronic coumadin for atrial fib. She was instructed to stop the coumadin five days prior to surgery. She will be olaced back onto coumadin postop and given Lovnenox bridge until her coumadin is back into a therapuetic range.  The patient will receive topical TXA (tranexamic acid) due to: Heart Disease  Signed electronically by Joelene Millin, III PA-C

## 2013-12-24 NOTE — Plan of Care (Signed)
Problem: Consults Goal: Diagnosis- Total Joint Replacement Right anterior hip     

## 2013-12-24 NOTE — Transfer of Care (Signed)
Immediate Anesthesia Transfer of Care Note  Patient: [name]  Procedure(s) Performed: Procedure(s) (LRB): RIGHT TOTAL HIP ARTHROPLASTY ANTERIOR APPROACH (Right)  Patient Location: PACU  Anesthesia Type: Spinal  Level of Consciousness: sedated, patient cooperative and responds to stimulation  Airway & Oxygen Therapy: Patient Spontanous Breathing and Patient connected to face mask oxgen  Post-op Assessment: Report given to PACU RN and Post -op Vital signs reviewed and stable  Post vital signs: Reviewed and stable  Complications: No apparent anesthesia complications

## 2013-12-25 ENCOUNTER — Encounter (HOSPITAL_COMMUNITY): Payer: Self-pay | Admitting: Orthopedic Surgery

## 2013-12-25 LAB — BASIC METABOLIC PANEL
Anion gap: 12 (ref 5–15)
BUN: 10 mg/dL (ref 6–23)
CO2: 24 mEq/L (ref 19–32)
CREATININE: 0.64 mg/dL (ref 0.50–1.10)
Calcium: 8.3 mg/dL — ABNORMAL LOW (ref 8.4–10.5)
Chloride: 95 mEq/L — ABNORMAL LOW (ref 96–112)
GFR calc Af Amer: >90 mL/min (ref >90)
GFR calc non Af Amer: 79 mL/min — ABNORMAL LOW (ref >90)
GLUCOSE: 206 mg/dL — AB (ref 70–99)
POTASSIUM: 3.9 meq/L (ref 3.7–5.3)
Sodium: 131 mEq/L — ABNORMAL LOW (ref 137–147)

## 2013-12-25 LAB — PROTIME-INR
INR: 1.27 (ref 0.00–1.49)
Prothrombin Time: 15.9 seconds — ABNORMAL HIGH (ref 11.6–15.2)

## 2013-12-25 LAB — CBC
HEMATOCRIT: 28.1 % — AB (ref 36.0–46.0)
HEMOGLOBIN: 9.6 g/dL — AB (ref 12.0–15.0)
MCH: 30.2 pg (ref 26.0–34.0)
MCHC: 34.2 g/dL (ref 30.0–36.0)
MCV: 88.4 fL (ref 78.0–100.0)
Platelets: 229 10*3/uL (ref 150–400)
RBC: 3.18 MIL/uL — ABNORMAL LOW (ref 3.87–5.11)
RDW: 14.5 % (ref 11.5–15.5)
WBC: 10.7 10*3/uL — ABNORMAL HIGH (ref 4.0–10.5)

## 2013-12-25 MED ORDER — SODIUM CHLORIDE 0.9 % IV BOLUS (SEPSIS)
250.0000 mL | Freq: Once | INTRAVENOUS | Status: AC
  Administered 2013-12-25: 250 mL via INTRAVENOUS

## 2013-12-25 MED ORDER — WARFARIN SODIUM 3 MG PO TABS
3.0000 mg | ORAL_TABLET | Freq: Once | ORAL | Status: AC
  Administered 2013-12-25: 3 mg via ORAL
  Filled 2013-12-25: qty 1

## 2013-12-25 MED ORDER — SODIUM CHLORIDE 0.9 % IV SOLN
INTRAVENOUS | Status: AC
  Administered 2013-12-25: 125 mL via INTRAVENOUS

## 2013-12-25 NOTE — Evaluation (Signed)
Occupational Therapy Evaluation Patient Details Name: [name] MRN: [id number] DOB: [birth year] Today's Date: 12/25/2013    History of Present Illness s/ DA R  THA; PMHx: bil TKAs   Clinical Impression   This 80 year old female was admitted for the above surgery. She is normally mod I with adls.  She wants to be as independent as possible and can have family assist as needed.  Pt will benefit from skilled OT to increase safety and independence with adls.  Goals in acute are for supervision level.  She is overall min to mod A at this time.  Recommend HHOT for IADLs.    Follow Up Recommendations  Home health OT    Equipment Recommendations  None recommended by OT    Recommendations for Other Services       Precautions / Restrictions Precautions Precautions: Fall Restrictions Weight Bearing Restrictions: No Other Position/Activity Restrictions: WBAT      Mobility Bed Mobility               General bed mobility comments: pt in chair  Transfers Overall transfer level: Needs assistance Equipment used: Rolling walker (2 wheeled) Transfers: Sit to/from Stand Sit to Stand: Min guard         General transfer comment: cues for UE/LE placement    Balance                                            ADL Overall ADL's : Needs assistance/impaired             Lower Body Bathing: Minimal assistance;Sit to/from stand       Lower Body Dressing: Moderate assistance;Sit to/from stand   Toilet Transfer: Minimal assistance;Ambulation;BSC       Tub/ Shower Transfer: Min guard;Ambulation;3 in 1     General ADL Comments: pt is able to complete UB adls with set up.  She has assistance at home but wants to be as independent as possible.  Pt has a reacher (suction cup).  Reviewed AE with pt and she tried sock aide. She is interested in trying leg lifter.  Pt has a very high bed at home:  discussed single sturdy step stool vs. moving to other  bedroom with lower bed     Vision                     Perception     Praxis      Pertinent Vitals/Pain Pain Assessment: 0-10 Pain Score: 4  Pain Location: R hip Pain Intervention(s): Premedicated before session;Repositioned;Ice applied     Hand Dominance     Extremity/Trunk Assessment Upper Extremity Assessment Upper Extremity Assessment: Overall WFL for tasks assessed   Lower Extremity Assessment Lower Extremity Assessment: RLE deficits/detail RLE Deficits / Details: flexion to ~ 5=45* at hip then painful; ankle and knee grossly [facility]       Communication Communication Communication: No difficulties   Cognition Arousal/Alertness: Awake/alert Behavior During Therapy: WFL for tasks assessed/performed Overall Cognitive Status: Within Functional Limits for tasks assessed                     General Comments       Exercises       Shoulder Instructions      Home Living Family/patient expects to be discharged to:: Private residence Living Arrangements: Alone Available Help  at Discharge: Family;Available 24 hours/day;Available PRN/intermittently Type of Home: House Home Access: Stairs to enter Entrance Stairs-Number of Steps: 1   Home Layout: Able to live on main level with bedroom/bathroom;Laundry or work area in Building surveyor of Steps: 1 step down to [employer] Shower/Tub: Hospital doctor Toilet: Handicapped height     Home Equipment: Environmental consultant - 2 wheels;Wheelchair - manual;Bedside commode;Cane - single point;Shower seat   Additional Comments: family works 3 minutes away.  Can be home to help prn and will stay overnight      Prior Functioning/Environment Level of Independence: Independent;Independent with assistive device(s)        Comments: amb with cane prior to adm    OT Diagnosis: Generalized weakness   OT Problem List: Decreased strength;Decreased activity tolerance;Decreased knowledge of use  of DME or AE;Pain   OT Treatment/Interventions: Self-care/ADL training;DME and/or AE instruction;Patient/family education    OT Goals(Current goals can be found in the care plan section) Acute Rehab OT Goals Patient Stated Goal: I as possible OT Goal Formulation: With patient Time For Goal Achievement: 01/01/14 Potential to Achieve Goals: Good ADL Goals Pt Will Perform Lower Body Bathing: with supervision;with adaptive equipment;sit to/from stand Pt Will Perform Lower Body Dressing: with supervision;with adaptive equipment;sit to/from stand Pt Will Transfer to Toilet: with supervision;bedside commode;ambulating Pt Will Perform Toileting - Clothing Manipulation and hygiene: with supervision;sit to/from stand  OT Frequency: Min 2X/week   Barriers to D/C:            Co-evaluation              End of Session    Activity Tolerance: Patient tolerated treatment well Patient left: in chair;with call bell/phone within reach   Time: 1158-1229 OT Time Calculation (min): 31 min Charges:  OT General Charges $OT Visit: 1 Procedure OT Evaluation $Initial OT Evaluation Tier I: 1 Procedure OT Treatments $Self Care/Home Management : 23-37 mins G-Codes:    Adarsh Mundorf 2014/01/15, 1:02 PM Lesle Chris, OTR/L [phone] 2014-01-15

## 2013-12-25 NOTE — Progress Notes (Signed)
CSW consulted for SNF placement. PN reviewed. PT / OT are recommending HHPT / HHOT following hospital d/c. RNCM will assist with d/c planning.  Werner Lean LCSW [phone]

## 2013-12-25 NOTE — Progress Notes (Signed)
  Subjective: 1 Day Post-Op Procedure(s) (LRB): RIGHT TOTAL HIP ARTHROPLASTY ANTERIOR APPROACH (Right) Patient reports pain as mild.   Patient seen in rounds with Dr. Wynelle Link. Doing "okay" today.   Was able to get some sleep. Patient is well, but has had some minor complaints of pain in the knee, requiring pain medications We will start therapy today.  Plan is to go home versus rehab after hospital stay.  Objective: Vital signs in last 24 hours: Temp:  [97.3 F (36.3 C)-99.1 F (37.3 C)] 97.8 F (36.6 C) (09/29 0519) Pulse Rate:  [59-68] 60 (09/29 0519) Resp:  [14-21] 14 (09/29 0519) BP: (99-150)/(47-73) 99/47 mmHg (09/29 0519) SpO2:  [94 %-100 %] 97 % (09/29 0519) Weight:  [68.947 kg (152 lb)] 68.947 kg (152 lb) (09/28 1645)  Intake/Output from previous day:  Intake/Output Summary (Last 24 hours) at 12/25/13 0834 Last data filed at 12/25/13 0630  Gross per 24 hour  Intake 2747.5 ml  Output   1465 ml  Net 1282.5 ml    Intake/Output this shift: UOP 325 since MN  Labs:  Recent Labs  12/25/13 0405  HGB 9.6*    Recent Labs  12/25/13 0405  WBC 10.7*  RBC 3.18*  HCT 28.1*  PLT 229    Recent Labs  12/25/13 0405  NA 131*  K 3.9  CL 95*  CO2 24  BUN 10  CREATININE 0.64  GLUCOSE 206*  CALCIUM 8.3*    Recent Labs  12/24/13 1021 12/25/13 0405  INR 1.20 1.27    EXAM General - Patient is Alert, Appropriate and Oriented Extremity - Neurovascular intact Sensation intact distally Dressing - dressing C/D/I Motor Function - intact, moving foot and toes well on exam.  Hemovac pulled without difficulty.  Past Medical History  Diagnosis Date  . Pneumonia   . Osteoarthritis   . Hypertension   . Atrial fibrillation   . Bradycardia   . Complication of anesthesia   . PONV (postoperative nausea and vomiting)   . Dysrhythmia     A FIB  . Pacemaker   . Shortness of breath     WITH EXERTION  . History of skin cancer   . H/O hiatal hernia   . History of  kidney stones   . History of transfusion   . Difficulty sleeping   . Anxiety     Assessment/Plan: 1 Day Post-Op Procedure(s) (LRB): RIGHT TOTAL HIP ARTHROPLASTY ANTERIOR APPROACH (Right) Principal Problem:   OA (osteoarthritis) of hip  Estimated body mass index is 26.08 kg/(m^2) as calculated from the following:   Height as of this encounter: 5\' 4"  (1.626 m).   Weight as of this encounter: 68.947 kg (152 lb). Advance diet Up with therapy Discharge home with home health versus skilled rehab following the hospital stay  DVT Prophylaxis - Lovenox and Coumadin Weight Bearing As Tolerated right Leg Hemovac Pulled Begin Therapy  [name], PA-C Orthopaedic Surgery 12/25/2013, 8:34 AM

## 2013-12-25 NOTE — Evaluation (Signed)
Physical Therapy Evaluation Patient Details Name: [name] MRN: [id number] DOB: 1925-12-02 Today's Date: 12/25/2013   History of Present Illness  s/ DA R  THA; PMHx: bil TKAs  Clinical Impression  Pt doing well this date, will benefit from continued PT to address deficits below; Plan at this time is for HHPT, pt has DME, good home support    Follow Up Recommendations Home health PT;Supervision - Intermittent    Equipment Recommendations       Recommendations for Other Services       Precautions / Restrictions Precautions Precautions: Fall Restrictions Other Position/Activity Restrictions: WBAT      Mobility  Bed Mobility               General bed mobility comments: pt in chair  Transfers Overall transfer level: Needs assistance Equipment used: Rolling walker (2 wheeled) Transfers: Sit to/from Stand Sit to Stand: Min guard         General transfer comment: cues for hand placement and RLE position  Ambulation/Gait Ambulation/Gait assistance: Min assist Ambulation Distance (Feet): 38 Feet Assistive device: Rolling walker (2 wheeled) Gait Pattern/deviations: Step-to pattern;Antalgic     General Gait Details: verbal cues for Rw position from self, step length and safety with turns  Stairs            Wheelchair Mobility    Modified Rankin (Stroke Patients Only)       Balance                                             Pertinent Vitals/Pain Pain Assessment: No/denies pain    Home Living Family/patient expects to be discharged to:: Private residence Living Arrangements: Alone Available Help at Discharge: Family;Available 24 hours/day;Available PRN/intermittently (2 sons can stay and assist prn) Type of Home: House Home Access: Stairs to enter   Entrance Stairs-Number of Steps: 1 Home Layout: Able to live on main level with bedroom/bathroom;Laundry or work area in [place]: Environmental consultant - 2  wheels;Wheelchair - manual;Bedside commode;Cane - single point Additional Comments: family assist with laundry; family does cooking, cleaning    Prior Function Level of Independence: Independent;Independent with assistive device(s)         Comments: amb with cane prior to adm     Hand Dominance        Extremity/Trunk Assessment   Upper Extremity Assessment: Defer to OT evaluation           Lower Extremity Assessment: RLE deficits/detail RLE Deficits / Details: flexion to ~ 5=45* at hip then painful; ankle and knee grossly WFL       Communication   Communication: No difficulties  Cognition Arousal/Alertness: Awake/alert Behavior During Therapy: WFL for tasks assessed/performed Overall Cognitive Status: Within Functional Limits for tasks assessed                      General Comments      Exercises Total Joint Exercises Ankle Circles/Pumps: AROM;10 reps Quad Sets: 10 reps;AROM Heel Slides: AAROM;Right;10 reps Hip ABduction/ADduction: AAROM;Right;10 reps      Assessment/Plan    PT Assessment Patient needs continued PT services  PT Diagnosis Difficulty walking   PT Problem List Decreased strength;Decreased activity tolerance;Decreased balance;Decreased mobility;Decreased range of motion  PT Treatment Interventions DME instruction;Gait training;Stair training;Functional mobility training;Therapeutic activities;Therapeutic exercise;Patient/family education   PT Goals (Current goals can be  found in the Care Plan section) Acute Rehab PT Goals Patient Stated Goal: I as possible PT Goal Formulation: With patient Time For Goal Achievement: 12/31/13 Potential to Achieve Goals: Good    Frequency 7X/week   Barriers to discharge        Co-evaluation               End of Session Equipment Utilized During Treatment: Gait belt Activity Tolerance: Patient tolerated treatment well Patient left: in chair;with call bell/phone within reach Nurse  Communication: Mobility status         Time: 5102-5852 PT Time Calculation (min): 28 min   Charges:   PT Evaluation $Initial PT Evaluation Tier I: 1 Procedure PT Treatments $Gait Training: 8-22 mins $Therapeutic Exercise: 8-22 mins   PT G Codes:          Darbi Chandran 01/03/14, 11:54 AM

## 2013-12-25 NOTE — Progress Notes (Signed)
ANTICOAGULATION CONSULT NOTE - Follow up  Pharmacy Consult for Warfarin Indication: atrial fibrillation and VTE prophylaxis  Allergies  Allergen Reactions  . Morphine And Related Nausea And Vomiting    Patient Measurements: Height: 5\' 4"  (162.6 cm) Weight: 152 lb (68.947 kg) IBW/kg (Calculated) : 54.7  Vital Signs: Temp: 98.3 F (36.8 C) (09/29 0944) Temp src: Oral (09/29 0944) BP: 130/51 mmHg (09/29 0944) Pulse Rate: 66 (09/29 0944)  Labs:  Recent Labs  12/24/13 1021 12/25/13 0405  HGB  --  9.6*  HCT  --  28.1*  PLT  --  229  LABPROT 15.2 15.9*  INR 1.20 1.27  CREATININE  --  0.64    Estimated Creatinine Clearance: 48.1 ml/min (by C-G formula based on Cr of 0.64).   Medical History: Past Medical History  Diagnosis Date  . Pneumonia   . Osteoarthritis   . Hypertension   . Atrial fibrillation   . Bradycardia   . Complication of anesthesia   . PONV (postoperative nausea and vomiting)   . Dysrhythmia     A FIB  . Pacemaker   . Shortness of breath     WITH EXERTION  . History of skin cancer   . H/O hiatal hernia   . History of kidney stones   . History of transfusion   . Difficulty sleeping   . Anxiety     Medications:  Scheduled:  . acetaminophen  1,000 mg Oral 4 times per day  . amLODipine  10 mg Oral q morning - 10a  . cloNIDine  0.05 mg Oral Daily  . cloNIDine  0.1 mg Oral QHS  . docusate sodium  100 mg Oral BID  . enoxaparin (LOVENOX) injection  40 mg Subcutaneous Q24H  . hydrochlorothiazide  25 mg Oral q morning - 10a  . irbesartan  300 mg Oral q morning - 10a  . potassium chloride SA  20 mEq Oral q morning - 10a  . valACYclovir  1,000 mg Oral BID  . warfarin  3 mg Oral ONCE-1800  . Warfarin - Pharmacist Dosing Inpatient   Does not apply q1800   Infusions:  . dextrose 5 % and 0.9% NaCl 75 mL/hr at 12/25/13 0630    Assessment: 88 yoF s/p Right total hip arthroplasty, anterior approach on 9/28.  PMH is significant for atrial  fibrillation on chronic warfarin.  She was taking 2mg  daily except 4mg  on Mon and Thursdays; last dose on 9/22.  Prophylactic Lovenox per MD.  Pharmacy is consulted to dose warfarin.  Today, 9/29  INR 1.27  Hgb 9.6, Plt 229  Diet: regular  No major drug-drug interactions identified  9/28: 5mg  warfarin  Goal of Therapy:  INR 2-3 Monitor platelets by anticoagulation protocol: Yes   Plan:   Warfarin 3mg   X 1 @ 1800  Continue Lovenox 40mg  SubQ q24h until INR is therapeutic.  Daily INR and CBC  Dolly Rias RPh 12/25/2013, 11:39 AM Pager [phone]

## 2013-12-25 NOTE — Anesthesia Postprocedure Evaluation (Signed)
  Anesthesia Post-op Note  Patient: [name]  Procedure(s) Performed: Procedure(s) (LRB): RIGHT TOTAL HIP ARTHROPLASTY ANTERIOR APPROACH (Right)  Patient Location: PACU  Anesthesia Type: Spinal  Level of Consciousness: awake and alert   Airway and Oxygen Therapy: Patient Spontanous Breathing  Post-op Pain: mild  Post-op Assessment: Post-op Vital signs reviewed, Patient's Cardiovascular Status Stable, Respiratory Function Stable, Patent Airway and No signs of Nausea or vomiting  Last Vitals:  Filed Vitals:   12/25/13 0944  BP: 130/51  Pulse: 66  Temp: 36.8 C  Resp: 14    Post-op Vital Signs: stable   Complications: No apparent anesthesia complications

## 2013-12-25 NOTE — Care Management Note (Addendum)
    Page 1 of 1   12/26/2013     9:11:43 AM CARE MANAGEMENT NOTE 12/26/2013  Patient:  [name], [name]   Account Number:  [id number]  Date Initiated:  12/25/2013  Documentation initiated by:  [facility]  Subjective/Objective Assessment:   adm: RIGHT TOTAL HIP ARTHROPLASTY ANTERIOR APPROACH (Right)     Action/Plan:   discharge planning   Anticipated DC Date:  12/27/2013   Anticipated DC Plan:  [place]  CM consult      Choice offered to / List presented to:             Status of service:  Completed, signed off Medicare Important Message given?   (If response is "NO", the following Medicare IM given date fields will be blank) Date Medicare IM given:   Medicare IM given by:   Date Additional Medicare IM given:   Additional Medicare IM given by:    Discharge Disposition:  [place]  Per UR Regulation:    If discussed at Long Length of Stay Meetings, dates discussed:    Comments:  12/26/13 09:09 MD notified this CM pt will be going to SNF; CSW aware and arranging.  [facility] notified of change in disposition to SNF.  No other Cm needs were communicated. Mariane Masters, BSN, [place] [phone].  12/25/13 14:25 CM met with pt in room to offer choice for HHPT/OT. Pt states she has had [facility] in the past but is requesting [facility] to render HHPT/OT.  Address and contact information verified with pt and she prefers home number to be primary contact number.  Pt state she has rolling walker and 3n1 at home.  Pt states she will get grabber and other OT help aids from our gift shop.  HHPT/OT orders and F2F have been requested of MD.  Referral called to [facility] rep, Stanton Kidney.  No other CM needs were communicated.  Mariane Masters, BSN, CM [phone].

## 2013-12-26 LAB — BASIC METABOLIC PANEL
Anion gap: 10 (ref 5–15)
BUN: 11 mg/dL (ref 6–23)
CHLORIDE: 93 meq/L — AB (ref 96–112)
CO2: 24 mEq/L (ref 19–32)
CREATININE: 0.6 mg/dL (ref 0.50–1.10)
Calcium: 8.1 mg/dL — ABNORMAL LOW (ref 8.4–10.5)
GFR calc Af Amer: >90 mL/min (ref >90)
GFR calc non Af Amer: 80 mL/min — ABNORMAL LOW (ref >90)
GLUCOSE: 132 mg/dL — AB (ref 70–99)
POTASSIUM: 3.9 meq/L (ref 3.7–5.3)
Sodium: 127 mEq/L — ABNORMAL LOW (ref 137–147)

## 2013-12-26 LAB — CBC
HCT: 21.9 % — ABNORMAL LOW (ref 36.0–46.0)
HEMOGLOBIN: 7.7 g/dL — AB (ref 12.0–15.0)
MCH: 30.8 pg (ref 26.0–34.0)
MCHC: 35.2 g/dL (ref 30.0–36.0)
MCV: 87.6 fL (ref 78.0–100.0)
Platelets: 215 10*3/uL (ref 150–400)
RBC: 2.5 MIL/uL — ABNORMAL LOW (ref 3.87–5.11)
RDW: 14.9 % (ref 11.5–15.5)
WBC: 16.9 10*3/uL — AB (ref 4.0–10.5)

## 2013-12-26 LAB — PROTIME-INR
INR: 2.42 — ABNORMAL HIGH (ref 0.00–1.49)
Prothrombin Time: 26.3 seconds — ABNORMAL HIGH (ref 11.6–15.2)

## 2013-12-26 LAB — PREPARE RBC (CROSSMATCH)

## 2013-12-26 MED ORDER — FUROSEMIDE 10 MG/ML IJ SOLN
10.0000 mg | Freq: Once | INTRAMUSCULAR | Status: AC
  Administered 2013-12-26: 10 mg via INTRAVENOUS
  Filled 2013-12-26: qty 1

## 2013-12-26 MED ORDER — ACETAMINOPHEN 325 MG PO TABS
650.0000 mg | ORAL_TABLET | Freq: Once | ORAL | Status: AC
  Administered 2013-12-26: 650 mg via ORAL
  Filled 2013-12-26: qty 2

## 2013-12-26 MED ORDER — SODIUM CHLORIDE 0.9 % IV SOLN: Freq: Once | INTRAVENOUS | Status: DC

## 2013-12-26 NOTE — Progress Notes (Signed)
Clinical Social Work Department BRIEF PSYCHOSOCIAL ASSESSMENT 12/26/2013  Patient:  [name], [name]     Account Number:  [id number]     [place] date:  12/24/2013  Clinical Social Worker:  Lacie Scotts  Date/Time:  12/26/2013 12:24 PM  Referred by:  Physician  Date Referred:  12/26/2013 Referred for  SNF Placement   Other Referral:   Interview type:  Patient Other interview type:    PSYCHOSOCIAL DATA Living Status:  HUSBAND Admitted from facility:   Level of care:   Primary support name:  Dominica Severin Primary support relationship to patient:  SPOUSE Degree of support available:   supportive    CURRENT CONCERNS Current Concerns  Post-Acute Placement   Other Concerns:    SOCIAL WORK ASSESSMENT / PLAN Pt is an 80 yr old female living at home prior to hospitalization. CSW met with pt to assist with d/c planning. This is a planned admission. Pt had planned to return home following hospital d/c but MD / PT / OT are now recommending [facility] at d/c. Pt has requested placement at [facility] ( [facility] ). CSW has contacted SNF and informed a pvt room will be available for pt at d/c. CSW will continue to follow to assist with d/c planning to SNF.   Assessment/plan status:  Psychosocial Support/Ongoing Assessment of Needs Other assessment/ plan:   Information/referral to community resources:   Insurance coverage for SNF and ambulance transport reviewed.    PATIENT'S/FAMILY'S RESPONSE TO PLAN OF CARE: Pt is disappointed that she will not be returning right home from hospital. " I know I need the rehab and I'm happy [facility] has a pvt room for me. [facility] is close to home which will make it easier for my family to visit. "    Werner Lean LCSW [phone]

## 2013-12-26 NOTE — Progress Notes (Signed)
Occupational Therapy Treatment Patient Details Name: [name] MRN: [id number] DOB: Jun 05, 1925 Today's Date: 12/26/2013    History of present illness s/ DA R  THA; PMHx: bil TKAs   OT comments  Pt is now planning snf for rehab.  Limited by nausea.  Pt has 7.7 HgB today  Follow Up Recommendations  SNF    Equipment Recommendations  None recommended by OT    Recommendations for Other Services      Precautions / Restrictions Precautions Precautions: Fall       Mobility Bed Mobility                  Transfers                      Balance                                   ADL                                         General ADL Comments: used reacher and sock aide from seated position for donning underwear and socks.  Did not stand.  pt began to feel a little queasy.  Didn't feel she needed anti-nausea medicine.  Let RN know she will call if this doesn't ease up more:  gave ginger ale for now.  BP 124/65 sitting.  Pt able to use AE with min cues.  Already dressed this morning.       Vision                     Perception     Praxis      Cognition   Behavior During Therapy: WFL for tasks assessed/performed Overall Cognitive Status: Within Functional Limits for tasks assessed                       Extremity/Trunk Assessment               Exercises     Shoulder Instructions       General Comments      Pertinent Vitals/ Pain       Pain Score: 2  Pain Location: r hip Pain Intervention(s): Premedicated before session;Monitored during session;Limited activity within patient's tolerance  Home Living                                          Prior Functioning/Environment              Frequency Min 2X/week     Progress Toward Goals  OT Goals(current goals can now be found in the care plan section)  Progress towards OT goals: Progressing toward goals      Plan Discharge plan needs to be updated    Co-evaluation                 End of Session     Activity Tolerance Patient limited by fatigue (and nausea)   Patient Left in chair;with call bell/phone within reach (PT putting pt back to bed)   Nurse Communication          Time: 2025-4270 OT Time Calculation (min): 19 min  Charges: OT General Charges $OT Visit: 1 Procedure OT Treatments $Self Care/Home Management : 8-22 mins  Maryse Brierley 12/26/2013, 9:43 AM Lesle Chris, OTR/L [phone] 12/26/2013

## 2013-12-26 NOTE — Progress Notes (Signed)
Clinical Social Work Department CLINICAL SOCIAL WORK PLACEMENT NOTE 12/26/2013  Patient:  [name], [name]  Account Number:  [id number] [place] date:  12/24/2013  Clinical Social Worker:  Werner Lean, LCSW  Date/time:  12/26/2013 12:35 PM  Clinical Social Work is seeking post-discharge placement for this patient at the following level of care:   SKILLED NURSING   (*CSW will update this form in Epic as items are completed)     Patient/family provided with [place] Department of Clinical Social Work's list of facilities offering this level of care within the geographic area requested by the patient (or if unable, by the patient's family).  12/26/2013  Patient/family informed of their freedom to choose among providers that offer the needed level of care, that participate in Medicare, Medicaid or managed care program needed by the patient, have an available bed and are willing to accept the patient.    Patient/family informed of [facility]' ownership interest in [facility], as well as of the fact that they are under no obligation to receive care at this facility.  PASARR submitted to EDS on 12/26/2013 PASARR number received on 12/26/2013  FL2 transmitted to all facilities in geographic area requested by pt/family on  12/26/2013 FL2 transmitted to all facilities within larger geographic area on   Patient informed that his/her managed care company has contracts with or will negotiate with  certain facilities, including the following:     Patient/family informed of bed offers received:  12/26/2013 Patient chooses bed at [facility] [place] Physician recommends and patient chooses bed at    Patient to be transferred to  on   Patient to be transferred to facility by  Patient and family notified of transfer on  Name of family member notified:    The following physician request were entered in Epic:   Additional Comments:  Werner Lean LCSW  [phone]

## 2013-12-26 NOTE — Clinical Documentation Improvement (Signed)
Please specify type and acuity of anemia if appropriate. Thank you.   . Documentation of Anemia should include the type of anemia: --Hemolytic --Due to blood loss --Other (please specify) . Document any "cause-and-effect" relationship between the intervention and the blood or immune disorder . Link any laboratory findings to a related diagnosis (if appropriate) . Document any associated diagnoses/conditions  Supporting Information: S/P Total hip replacement Labs: H&H 9/22 = 13.1/38.8 9/29 = 9.6/28.1 9/30 = 7.7/21.9  9/30 Transfusion of 2 units PRBC ordered  Thank You, [place] Subramaniam T. Pricilla Handler, MSN, MBA/MHA Clinical Documentation Specialist [email or web address] Office # [phone]

## 2013-12-26 NOTE — Progress Notes (Signed)
Physical Therapy Treatment Patient Details Name: [name] MRN: [id number] DOB: 1925/08/27 Today's Date: 12/26/2013    History of Present Illness s/ DA R  THA; PMHx: bil TKAs    PT Comments    Pt  With c/o feeling just generally bad this am; Hgb 7.7 (down from 9.6), to get 1 unit of blood today; She was slightly dizzy and nauseated  with chair to bed transfer, did not feel up to walking; HEP completed and pt very motivated and cooperative what she was able to do this am. RN gave nausea meds during session  Follow Up Recommendations  SNF     Equipment Recommendations  None recommended by PT    Recommendations for Other Services       Precautions / Restrictions Precautions Precautions: Fall Restrictions Other Position/Activity Restrictions: WBAT    Mobility  Bed Mobility Overal bed mobility: Needs Assistance Bed Mobility: Sit to Supine       Sit to supine: Min assist   General bed mobility comments: min with RLE, pt using  gait belt as simulated leg lifter  Transfers Overall transfer level: Needs assistance Equipment used: Rolling walker (2 wheeled) Transfers: Sit to/from [facility] Sit to Stand: Min guard Stand pivot transfers: Min guard;Min assist       General transfer comment: cues for UE/LE placement  Ambulation/Gait Ambulation/Gait assistance: Min assist Ambulation Distance (Feet): 5 Feet (pivotal steps) Assistive device: Rolling walker (2 wheeled) Gait Pattern/deviations: Step-to pattern     General Gait Details: verbal cues for RW position from self   Stairs            Wheelchair Mobility    Modified Rankin (Stroke Patients Only)       Balance                                    Cognition Arousal/Alertness: Awake/alert Behavior During Therapy: WFL for tasks assessed/performed Overall Cognitive Status: Within Functional Limits for tasks assessed                      Exercises Total  Joint Exercises Ankle Circles/Pumps: AROM;10 reps;Both Quad Sets: AROM;Strengthening;Both;10 reps Short Arc Quad: AROM;Right;10 reps Heel Slides: AAROM;Right;10 reps Hip ABduction/ADduction: AAROM;Right;10 reps    General Comments        Pertinent Vitals/Pain Pain Assessment: 0-10 Pain Score: 2  Pain Location: R hip Pain Descriptors / Indicators: Sore Pain Intervention(s): Repositioned;Limited activity within patient's tolerance    Home Living                      Prior Function            PT Goals (current goals can now be found in the care plan section) Acute Rehab PT Goals Patient Stated Goal: I as possible Time For Goal Achievement: 12/31/13 Potential to Achieve Goals: Good Progress towards PT goals: Progressing toward goals    Frequency  7X/week    PT Plan Discharge plan needs to be updated    Co-evaluation             End of Session Equipment Utilized During Treatment: Gait belt Activity Tolerance: Patient tolerated treatment well;Patient limited by fatigue;Treatment limited secondary to medical complications (Comment) (dizzy, c/o feeling bad) Patient left: in bed;with call bell/phone within reach     Time: 0932-0950 PT Time Calculation (min): 18 min  Charges:  $  Therapeutic Exercise: 8-22 mins                    G Codes:      Avner Stroder Jan 18, 2014, 11:28 AM

## 2013-12-26 NOTE — Progress Notes (Signed)
ANTICOAGULATION CONSULT NOTE - Follow up  Pharmacy Consult for Warfarin Indication: atrial fibrillation and VTE prophylaxis  Allergies  Allergen Reactions  . Morphine And Related Nausea And Vomiting    Patient Measurements: Height: 5\' 4"  (162.6 cm) Weight: 152 lb (68.947 kg) IBW/kg (Calculated) : 54.7  Vital Signs: Temp: 98.7 F (37.1 C) (09/30 0510) Temp src: Oral (09/30 0510) BP: 110/49 mmHg (09/30 0510) Pulse Rate: 59 (09/30 0510)  Labs:  Recent Labs  12/24/13 1021 12/25/13 0405 12/26/13 0402  HGB  --  9.6* 7.7*  HCT  --  28.1* 21.9*  PLT  --  229 215  LABPROT 15.2 15.9* 26.3*  INR 1.20 1.27 2.42*  CREATININE  --  0.64 0.60    Estimated Creatinine Clearance: 48.1 ml/min (by C-G formula based on Cr of 0.6).   Medical History: Past Medical History  Diagnosis Date  . Pneumonia   . Osteoarthritis   . Hypertension   . Atrial fibrillation   . Bradycardia   . Complication of anesthesia   . PONV (postoperative nausea and vomiting)   . Dysrhythmia     A FIB  . Pacemaker   . Shortness of breath     WITH EXERTION  . History of skin cancer   . H/O hiatal hernia   . History of kidney stones   . History of transfusion   . Difficulty sleeping   . Anxiety     Medications:  Scheduled:  . amLODipine  10 mg Oral q morning - 10a  . cloNIDine  0.05 mg Oral Daily  . cloNIDine  0.1 mg Oral QHS  . docusate sodium  100 mg Oral BID  . enoxaparin (LOVENOX) injection  40 mg Subcutaneous Q24H  . hydrochlorothiazide  25 mg Oral q morning - 10a  . irbesartan  300 mg Oral q morning - 10a  . potassium chloride SA  20 mEq Oral q morning - 10a  . valACYclovir  1,000 mg Oral BID  . Warfarin - Pharmacist Dosing Inpatient   Does not apply q1800   Infusions:  . sodium chloride 125 mL (12/25/13 1300)  . dextrose 5 % and 0.9% NaCl 75 mL/hr at 12/25/13 0630    Assessment: 88 yoF s/p Right total hip arthroplasty, anterior approach on 9/28.  PMH is significant for atrial  fibrillation on chronic warfarin.  She was taking 2mg  daily except 4mg  on Mon and Thursdays; last dose on 9/22.  Prophylactic Lovenox per MD.  Pharmacy is consulted to dose warfarin.  Today, 9/29  INR 2.42 (increased from 1.27 yesterday)  Hgb dropped to 7.7, Plt 215  Diet: regular  No major drug-drug interactions identified  9/28: 5mg  warfarin 9/29: 3mg  warfarin  Goal of Therapy:  INR 2-3 Monitor platelets by anticoagulation protocol: Yes   Plan:   Hold warfarin today due to large increase in INR from yesterdray  Discontinue Lovenox  Daily INR and CBC  Kizzie Furnish, PharmD Pager: [phone] 12/26/2013 7:42 AM

## 2013-12-27 DIAGNOSIS — I499 Cardiac arrhythmia, unspecified: Secondary | ICD-10-CM | POA: Diagnosis not present

## 2013-12-27 DIAGNOSIS — M1990 Unspecified osteoarthritis, unspecified site: Secondary | ICD-10-CM | POA: Diagnosis not present

## 2013-12-27 DIAGNOSIS — Z950 Presence of cardiac pacemaker: Secondary | ICD-10-CM | POA: Diagnosis not present

## 2013-12-27 DIAGNOSIS — D62 Acute posthemorrhagic anemia: Secondary | ICD-10-CM | POA: Diagnosis not present

## 2013-12-27 DIAGNOSIS — R112 Nausea with vomiting, unspecified: Secondary | ICD-10-CM | POA: Diagnosis not present

## 2013-12-27 DIAGNOSIS — M25559 Pain in unspecified hip: Secondary | ICD-10-CM | POA: Diagnosis not present

## 2013-12-27 DIAGNOSIS — I10 Essential (primary) hypertension: Secondary | ICD-10-CM | POA: Diagnosis not present

## 2013-12-27 DIAGNOSIS — R001 Bradycardia, unspecified: Secondary | ICD-10-CM | POA: Diagnosis not present

## 2013-12-27 DIAGNOSIS — F419 Anxiety disorder, unspecified: Secondary | ICD-10-CM | POA: Diagnosis not present

## 2013-12-27 DIAGNOSIS — Z85828 Personal history of other malignant neoplasm of skin: Secondary | ICD-10-CM | POA: Diagnosis not present

## 2013-12-27 DIAGNOSIS — J189 Pneumonia, unspecified organism: Secondary | ICD-10-CM | POA: Diagnosis not present

## 2013-12-27 DIAGNOSIS — I4891 Unspecified atrial fibrillation: Secondary | ICD-10-CM | POA: Diagnosis not present

## 2013-12-27 DIAGNOSIS — Z87442 Personal history of urinary calculi: Secondary | ICD-10-CM | POA: Diagnosis not present

## 2013-12-27 DIAGNOSIS — D509 Iron deficiency anemia, unspecified: Secondary | ICD-10-CM | POA: Diagnosis not present

## 2013-12-27 DIAGNOSIS — T8859XD Other complications of anesthesia, subsequent encounter: Secondary | ICD-10-CM | POA: Diagnosis not present

## 2013-12-27 DIAGNOSIS — I480 Paroxysmal atrial fibrillation: Secondary | ICD-10-CM | POA: Diagnosis not present

## 2013-12-27 DIAGNOSIS — Z9889 Other specified postprocedural states: Secondary | ICD-10-CM | POA: Diagnosis not present

## 2013-12-27 DIAGNOSIS — R0602 Shortness of breath: Secondary | ICD-10-CM | POA: Diagnosis not present

## 2013-12-27 DIAGNOSIS — Z9289 Personal history of other medical treatment: Secondary | ICD-10-CM

## 2013-12-27 DIAGNOSIS — Z96641 Presence of right artificial hip joint: Secondary | ICD-10-CM | POA: Diagnosis not present

## 2013-12-27 LAB — TYPE AND SCREEN
ABO/RH(D): A POS
Antibody Screen: NEGATIVE
Unit division: 0
Unit division: 0

## 2013-12-27 LAB — CBC
HEMATOCRIT: 30.7 % — AB (ref 36.0–46.0)
Hemoglobin: 10.7 g/dL — ABNORMAL LOW (ref 12.0–15.0)
MCH: 30 pg (ref 26.0–34.0)
MCHC: 34.9 g/dL (ref 30.0–36.0)
MCV: 86 fL (ref 78.0–100.0)
PLATELETS: 197 10*3/uL (ref 150–400)
RBC: 3.57 MIL/uL — ABNORMAL LOW (ref 3.87–5.11)
RDW: 17.3 % — AB (ref 11.5–15.5)
WBC: 13.9 10*3/uL — ABNORMAL HIGH (ref 4.0–10.5)

## 2013-12-27 LAB — BASIC METABOLIC PANEL
ANION GAP: 10 (ref 5–15)
BUN: 15 mg/dL (ref 6–23)
CALCIUM: 8.7 mg/dL (ref 8.4–10.5)
CO2: 28 mEq/L (ref 19–32)
Chloride: 92 mEq/L — ABNORMAL LOW (ref 96–112)
Creatinine, Ser: 0.63 mg/dL (ref 0.50–1.10)
GFR, EST NON AFRICAN AMERICAN: 79 mL/min — AB (ref >90)
Glucose, Bld: 102 mg/dL — ABNORMAL HIGH (ref 70–99)
Potassium: 3.4 mEq/L — ABNORMAL LOW (ref 3.7–5.3)
SODIUM: 130 meq/L — AB (ref 137–147)

## 2013-12-27 LAB — PROTIME-INR
INR: 1.84 — ABNORMAL HIGH (ref 0.00–1.49)
PROTHROMBIN TIME: 21.3 s — AB (ref 11.6–15.2)

## 2013-12-27 MED ORDER — METHOCARBAMOL 500 MG PO TABS: 500.0000 mg | ORAL_TABLET | Freq: Four times a day (QID) | ORAL | Status: DC | PRN

## 2013-12-27 MED ORDER — BISACODYL 10 MG RE SUPP: 10.0000 mg | Freq: Every day | RECTAL | Status: DC | PRN

## 2013-12-27 MED ORDER — ONDANSETRON HCL 4 MG PO TABS: 4.0000 mg | ORAL_TABLET | Freq: Four times a day (QID) | ORAL | Status: DC | PRN

## 2013-12-27 MED ORDER — METOCLOPRAMIDE HCL 5 MG PO TABS: 5.0000 mg | ORAL_TABLET | Freq: Three times a day (TID) | ORAL | Status: DC | PRN

## 2013-12-27 MED ORDER — POLYETHYLENE GLYCOL 3350 17 G PO PACK: 17.0000 g | PACK | Freq: Every day | ORAL | Status: DC | PRN

## 2013-12-27 MED ORDER — DSS 100 MG PO CAPS: 100.0000 mg | ORAL_CAPSULE | Freq: Two times a day (BID) | ORAL | Status: DC

## 2013-12-27 MED ORDER — WARFARIN SODIUM 2 MG PO TABS: 2.0000 mg | ORAL_TABLET | ORAL | Status: DC

## 2013-12-27 MED ORDER — TRAMADOL HCL 50 MG PO TABS: 50.0000 mg | ORAL_TABLET | Freq: Four times a day (QID) | ORAL | Status: DC | PRN

## 2013-12-27 MED ORDER — ENOXAPARIN SODIUM 40 MG/0.4ML ~~LOC~~ SOLN: 40.0000 mg | SUBCUTANEOUS | Status: DC

## 2013-12-27 MED ORDER — HYDROMORPHONE HCL 2 MG PO TABS: 1.0000 mg | ORAL_TABLET | ORAL | Status: DC | PRN

## 2013-12-27 NOTE — Discharge Summary (Signed)
Physician Discharge Summary   Patient ID: [name] MRN: [id number] DOB/AGE: 1925-11-26 80 y.o.  Admit date: 12/24/2013 Discharge date: 12/27/2013  Primary Diagnosis:  Osteoarthritis of the Right hip.   Admission Diagnoses:  Past Medical History  Diagnosis Date  . Pneumonia   . Osteoarthritis   . Hypertension   . Atrial fibrillation   . Bradycardia   . Complication of anesthesia   . PONV (postoperative nausea and vomiting)   . Dysrhythmia     A FIB  . Pacemaker   . Shortness of breath     WITH EXERTION  . History of skin cancer   . H/O hiatal hernia   . History of kidney stones   . History of transfusion   . Difficulty sleeping   . Anxiety    Discharge Diagnoses:   Principal Problem:   OA (osteoarthritis) of hip Active Problems:   Postoperative anemia due to acute blood loss   Postop Transfusion  Estimated body mass index is 26.08 kg/(m^2) as calculated from the following:   Height as of this encounter: '5\' 4"'  (1.626 m).   Weight as of this encounter: 68.947 kg (152 lb).  Procedure(s) (LRB): RIGHT TOTAL HIP ARTHROPLASTY ANTERIOR APPROACH (Right)   Consults: None  HPI: [name] is a 80 y.o. female who has advanced end-  stage arthritis of her Right hip with progressively worsening pain and  dysfunction.The patient has failed nonoperative management and presents for  total hip arthroplasty.   Laboratory Data: Admission on 12/24/2013  Component Date Value Ref Range Status  . Prothrombin Time 12/24/2013 15.2  11.6 - 15.2 seconds Final  . INR 12/24/2013 1.20  0.00 - 1.49 Final  . WBC 12/25/2013 10.7* 4.0 - 10.5 K/uL Final  . RBC 12/25/2013 3.18* 3.87 - 5.11 MIL/uL Final  . Hemoglobin 12/25/2013 9.6* 12.0 - 15.0 g/dL Final  . HCT 12/25/2013 28.1* 36.0 - 46.0 % Final  . MCV 12/25/2013 88.4  78.0 - 100.0 fL Final  . MCH 12/25/2013 30.2  26.0 - 34.0 pg Final  . MCHC 12/25/2013 34.2  30.0 - 36.0 g/dL Final  . RDW 12/25/2013 14.5  11.5 - 15.5 %  Final  . Platelets 12/25/2013 229  150 - 400 K/uL Final  . Sodium 12/25/2013 131* 137 - 147 mEq/L Final  . Potassium 12/25/2013 3.9  3.7 - 5.3 mEq/L Final  . Chloride 12/25/2013 95* 96 - 112 mEq/L Final  . CO2 12/25/2013 24  19 - 32 mEq/L Final  . Glucose, Bld 12/25/2013 206* 70 - 99 mg/dL Final  . BUN 12/25/2013 10  6 - 23 mg/dL Final  . Creatinine, Ser 12/25/2013 0.64  0.50 - 1.10 mg/dL Final  . Calcium 12/25/2013 8.3* 8.4 - 10.5 mg/dL Final  . GFR calc non Af Amer 12/25/2013 79* >90 mL/min Final  . GFR calc Af Amer 12/25/2013 >90  >90 mL/min Final   Comment: (NOTE)                          The eGFR has been calculated using the CKD EPI equation.                          This calculation has not been validated in all clinical situations.                          eGFR's persistently <90 mL/min  signify possible Chronic Kidney                          Disease.  . Anion gap 12/25/2013 12  5 - 15 Final  . Prothrombin Time 12/25/2013 15.9* 11.6 - 15.2 seconds Final  . INR 12/25/2013 1.27  0.00 - 1.49 Final  . WBC 12/26/2013 16.9* 4.0 - 10.5 K/uL Final  . RBC 12/26/2013 2.50* 3.87 - 5.11 MIL/uL Final  . Hemoglobin 12/26/2013 7.7* 12.0 - 15.0 g/dL Final   Comment: DELTA CHECK NOTED                          REPEATED TO VERIFY  . HCT 12/26/2013 21.9* 36.0 - 46.0 % Final  . MCV 12/26/2013 87.6  78.0 - 100.0 fL Final  . MCH 12/26/2013 30.8  26.0 - 34.0 pg Final  . MCHC 12/26/2013 35.2  30.0 - 36.0 g/dL Final  . RDW 12/26/2013 14.9  11.5 - 15.5 % Final  . Platelets 12/26/2013 215  150 - 400 K/uL Final  . Sodium 12/26/2013 127* 137 - 147 mEq/L Final  . Potassium 12/26/2013 3.9  3.7 - 5.3 mEq/L Final  . Chloride 12/26/2013 93* 96 - 112 mEq/L Final  . CO2 12/26/2013 24  19 - 32 mEq/L Final  . Glucose, Bld 12/26/2013 132* 70 - 99 mg/dL Final  . BUN 12/26/2013 11  6 - 23 mg/dL Final  . Creatinine, Ser 12/26/2013 0.60  0.50 - 1.10 mg/dL Final  . Calcium 12/26/2013 8.1* 8.4 - 10.5 mg/dL Final  .  GFR calc non Af Amer 12/26/2013 80* >90 mL/min Final  . GFR calc Af Amer 12/26/2013 >90  >90 mL/min Final   Comment: (NOTE)                          The eGFR has been calculated using the CKD EPI equation.                          This calculation has not been validated in all clinical situations.                          eGFR's persistently <90 mL/min signify possible Chronic Kidney                          Disease.  . Anion gap 12/26/2013 10  5 - 15 Final  . Prothrombin Time 12/26/2013 26.3* 11.6 - 15.2 seconds Final  . INR 12/26/2013 2.42* 0.00 - 1.49 Final  . Order Confirmation 12/26/2013 ORDER PROCESSED BY BLOOD BANK   Final  . WBC 12/27/2013 13.9* 4.0 - 10.5 K/uL Final  . RBC 12/27/2013 3.57* 3.87 - 5.11 MIL/uL Final  . Hemoglobin 12/27/2013 10.7* 12.0 - 15.0 g/dL Final   Comment: DELTA CHECK NOTED                          REPEATED TO VERIFY                          POST TRANSFUSION SPECIMEN  . HCT 12/27/2013 30.7* 36.0 - 46.0 % Final  . MCV 12/27/2013 86.0  78.0 - 100.0 fL Final  . MCH 12/27/2013 30.0  26.0 -  34.0 pg Final  . MCHC 12/27/2013 34.9  30.0 - 36.0 g/dL Final  . RDW 12/27/2013 17.3* 11.5 - 15.5 % Final  . Platelets 12/27/2013 197  150 - 400 K/uL Final  . Prothrombin Time 12/27/2013 21.3* 11.6 - 15.2 seconds Final  . INR 12/27/2013 1.84* 0.00 - 1.49 Final  . Sodium 12/27/2013 130* 137 - 147 mEq/L Final  . Potassium 12/27/2013 3.4* 3.7 - 5.3 mEq/L Final  . Chloride 12/27/2013 92* 96 - 112 mEq/L Final  . CO2 12/27/2013 28  19 - 32 mEq/L Final  . Glucose, Bld 12/27/2013 102* 70 - 99 mg/dL Final  . BUN 12/27/2013 15  6 - 23 mg/dL Final  . Creatinine, Ser 12/27/2013 0.63  0.50 - 1.10 mg/dL Final  . Calcium 12/27/2013 8.7  8.4 - 10.5 mg/dL Final  . GFR calc non Af Amer 12/27/2013 79* >90 mL/min Final  . GFR calc Af Amer 12/27/2013 >90  >90 mL/min Final   Comment: (NOTE)                          The eGFR has been calculated using the CKD EPI equation.                           This calculation has not been validated in all clinical situations.                          eGFR's persistently <90 mL/min signify possible Chronic Kidney                          Disease.  [name] gap 12/27/2013 10  5 - 15 Final  Hospital Outpatient Visit on 12/18/2013  Component Date Value Ref Range Status  . aPTT 12/18/2013 39* 24 - 37 seconds Final   Comment:                                 IF BASELINE aPTT IS ELEVATED,                          SUGGEST PATIENT RISK ASSESSMENT                          BE USED TO DETERMINE APPROPRIATE                          ANTICOAGULANT THERAPY.  . WBC 12/18/2013 6.4  4.0 - 10.5 K/uL Final  . RBC 12/18/2013 4.47  3.87 - 5.11 MIL/uL Final  . Hemoglobin 12/18/2013 13.1  12.0 - 15.0 g/dL Final  . HCT 12/18/2013 38.8  36.0 - 46.0 % Final  . MCV 12/18/2013 86.8  78.0 - 100.0 fL Final  . MCH 12/18/2013 29.3  26.0 - 34.0 pg Final  . MCHC 12/18/2013 33.8  30.0 - 36.0 g/dL Final  . RDW 12/18/2013 13.9  11.5 - 15.5 % Final  . Platelets 12/18/2013 304  150 - 400 K/uL Final  . Sodium 12/18/2013 131* 137 - 147 mEq/L Final  . Potassium 12/18/2013 4.5  3.7 - 5.3 mEq/L Final  . Chloride 12/18/2013 93* 96 - 112 mEq/L Final  . CO2 12/18/2013 28  19 - 32 mEq/L Final  .  Glucose, Bld 12/18/2013 94  70 - 99 mg/dL Final  . BUN 12/18/2013 11  6 - 23 mg/dL Final  . Creatinine, Ser 12/18/2013 0.63  0.50 - 1.10 mg/dL Final  . Calcium 12/18/2013 9.5  8.4 - 10.5 mg/dL Final  . Total Protein 12/18/2013 7.1  6.0 - 8.3 g/dL Final  . Albumin 12/18/2013 3.6  3.5 - 5.2 g/dL Final  . AST 12/18/2013 21  0 - 37 U/L Final  . ALT 12/18/2013 12  0 - 35 U/L Final  . Alkaline Phosphatase 12/18/2013 92  39 - 117 U/L Final  . Total Bilirubin 12/18/2013 0.7  0.3 - 1.2 mg/dL Final  . GFR calc non Af Amer 12/18/2013 79* >90 mL/min Final  . GFR calc Af Amer 12/18/2013 >90  >90 mL/min Final   Comment: (NOTE)                          The eGFR has been calculated using the CKD EPI  equation.                          This calculation has not been validated in all clinical situations.                          eGFR's persistently <90 mL/min signify possible Chronic Kidney                          Disease.  . Anion gap 12/18/2013 10  5 - 15 Final  . Prothrombin Time 12/18/2013 25.4* 11.6 - 15.2 seconds Final  . INR 12/18/2013 2.31* 0.00 - 1.49 Final  . ABO/RH(D) 12/18/2013 A POS   Final  . Antibody Screen 12/18/2013 NEG   Final  . Sample Expiration 12/18/2013 12/27/2013   Final  . Unit Number 12/18/2013 [id number]   Final  . Blood Component Type 12/18/2013 RED CELLS,LR   Final  . Unit division 12/18/2013 00   Final  . Status of Unit 12/18/2013 ISSUED   Final  . Transfusion Status 12/18/2013 OK TO TRANSFUSE   Final  . Crossmatch Result 12/18/2013 Compatible   Final  . Unit Number 12/18/2013 [id number]   Final  . Blood Component Type 12/18/2013 RED CELLS,LR   Final  . Unit division 12/18/2013 00   Final  . Status of Unit 12/18/2013 ISSUED   Final  . Transfusion Status 12/18/2013 OK TO TRANSFUSE   Final  . Crossmatch Result 12/18/2013 Compatible   Final  . Color, Urine 12/18/2013 YELLOW  YELLOW Final  . APPearance 12/18/2013 CLEAR  CLEAR Final  . Specific Gravity, Urine 12/18/2013 1.013  1.005 - 1.030 Final  . pH 12/18/2013 7.0  5.0 - 8.0 Final  . Glucose, UA 12/18/2013 NEGATIVE  NEGATIVE mg/dL Final  . Hgb urine dipstick 12/18/2013 NEGATIVE  NEGATIVE Final  . Bilirubin Urine 12/18/2013 NEGATIVE  NEGATIVE Final  . Ketones, ur 12/18/2013 NEGATIVE  NEGATIVE mg/dL Final  . Protein, ur 12/18/2013 NEGATIVE  NEGATIVE mg/dL Final  . Urobilinogen, UA 12/18/2013 0.2  0.0 - 1.0 mg/dL Final  . Nitrite 12/18/2013 NEGATIVE  NEGATIVE Final  . Leukocytes, UA 12/18/2013 TRACE* NEGATIVE Final  . ABO/RH(D) 12/18/2013 A POS   Final  . Squamous Epithelial / LPF 12/18/2013 RARE  RARE Final  . WBC, UA 12/18/2013 0-2  <3 WBC/hpf Final  . MRSA, PCR 12/18/2013 NEGATIVE  NEGATIVE  Final  . Staphylococcus aureus 12/18/2013 NEGATIVE  NEGATIVE Final   Comment:                                 The Xpert SA Assay (FDA                          approved for NASAL specimens                          in patients over 32 years of age),                          is one component of                          a comprehensive surveillance                          program.  Test performance has                          been validated by [employer] for patients greater                          than or equal to 78 year old.                          It is not intended                          to diagnose infection nor to                          guide or monitor treatment.     X-Rays:Dg Chest 2 View  12/18/2013   CLINICAL DATA:  Preop for hip surgery.  Hypertension.  EXAM: CHEST  2 VIEW  COMPARISON:  03/27/2006  FINDINGS: Cardiac silhouette is normal in size. Aorta is uncoiled. No mediastinal or hilar masses or evidence of adenopathy.  Linear opacity at the left lung bases most likely atelectasis. Lungs are otherwise clear. No pleural effusion. No pneumothorax.  Left anterior chest wall single lead pacemaker is stable and well positioned.  Bony thorax is demineralized but grossly intact.  IMPRESSION: No acute cardiopulmonary disease.   Electronically Signed   By: Lajean Manes M.D.   On: 12/18/2013 11:59   Dg Hip Complete Right  12/18/2013   CLINICAL DATA:  Preop for right hip surgery.  EXAM: RIGHT HIP - COMPLETE 2+ VIEW  COMPARISON:  None.  FINDINGS: No fracture.  No bone lesion.  The bones are demineralized.  There is marked axial hip joint space narrowing on the right. Mild spurring is noted from the base of the right femoral head. A focus of ossification lies lateral to the superior right hilum.  Left hip joint is normally space and aligned. SI joints and symphysis pubis are normally space and aligned. Degenerative changes are noted of the lower lumbar spine.  Soft tissues are unremarkable.  IMPRESSION: 1. Arthropathic changes of the right hip as described. 2. No fracture or bone lesion.   Electronically Signed   By: Lajean Manes M.D.   On: 12/18/2013 11:54   Dg Pelvis Portable  12/24/2013   CLINICAL DATA:  Right hip arthroplasty.  EXAM: DG C-ARM 1-60 MIN - NRPT [facility]; PORTABLE PELVIS 1-2 VIEWS  COMPARISON:  12/18/2013  FINDINGS: Patient has undergone a right hip arthroplasty. The right hip appears located on these AP views. Femoral stem is completely visualized. Pelvic bony ring is intact. Mild degenerative changes in the left hip joint. Multiple phleboliths in the pelvis.  IMPRESSION: Right hip arthroplasty without complicating features.   Electronically Signed   By: Markus Daft M.D.   On: 12/24/2013 15:39   Dg C-arm 1-60 Min-no Report  12/24/2013   CLINICAL DATA:  Right hip arthroplasty.  EXAM: DG C-ARM 1-60 MIN - NRPT [facility]; PORTABLE PELVIS 1-2 VIEWS  COMPARISON:  12/18/2013  FINDINGS: Patient has undergone a right hip arthroplasty. The right hip appears located on these AP views. Femoral stem is completely visualized. Pelvic bony ring is intact. Mild degenerative changes in the left hip joint. Multiple phleboliths in the pelvis.  IMPRESSION: Right hip arthroplasty without complicating features.   Electronically Signed   By: Markus Daft M.D.   On: 12/24/2013 15:39    EKG: Orders placed during the hospital encounter of 05/21/13  . EKG 12-LEAD  . EKG 12-LEAD  . EKG 12-LEAD  . EKG 12-LEAD  . EKG     Hospital Course: Patient was admitted to [facility] and taken to the OR and underwent the above state procedure without complications.  Patient tolerated the procedure well and was later transferred to the recovery room and then to the orthopaedic floor for postoperative care.  They were given PO and IV analgesics for pain control following their surgery.  They were given 24 hours of postoperative antibiotics of  Anti-infectives   Start      Dose/Rate Route Frequency Ordered Stop   12/24/13 2200  valACYclovir (VALTREX) tablet 1,000 mg  Status:  Discontinued     1,000 mg Oral 2 times daily 12/24/13 1654 12/24/13 1658   12/24/13 2200  valACYclovir (VALTREX) tablet 1,000 mg     1,000 mg Oral 2 times daily 12/24/13 1658     12/24/13 2000  ceFAZolin (ANCEF) IVPB 2 g/50 mL premix     2 g 100 mL/hr over 30 Minutes Intravenous Every 6 hours 12/24/13 1654 12/25/13 0253   12/24/13 1003  ceFAZolin (ANCEF) IVPB 2 g/50 mL premix     2 g 100 mL/hr over 30 Minutes Intravenous On call to O.R. 12/24/13 1003 12/24/13 1332     and started on DVT prophylaxis in the form of Lovenox and Coumadin.  She was on chronic coumadin for atrial fib.  PT and OT were ordered for total hip protocol.  The patient was allowed to be WBAT with therapy. Discharge planning was consulted to help with postop disposition and equipment needs. Social worker also got involved to assist with placement if needed following the hospital stay.  Patient had a decent night on the evening of surgery.  They started to get up OOB with therapy on day one.  Hemovac drain was pulled without difficulty.  Continued to work with therapy into day two. HGB dropped on day two to 7.7 so she was given two units of blood.  Dressing was changed on day two  and the incision was healing well.  By day three, the patient had progressed with therapy and meeting their goals.  Incision was healing well.  Patient was seen in rounds and was ready to go to [place].  Discharge to SNF - [facility] [place]  Follow up - in 2 weeks  Activity - WBAT  Disposition - Skilled nursing facility - [facility] [place] Upon Discharge - Good  D/C Meds - See DC Summary  DVT Prophylaxis - Lovenox and Coumadin, INR - 1.84 at time of transfer  Discharge Instructions   Call MD / Call 911    Complete by:  As directed   If you experience chest pain or shortness of  breath, CALL 911 and be transported to the hospital emergency room.  If you develope a fever above 101 F, pus (white drainage) or increased drainage or redness at the wound, or calf pain, call your surgeon's office.     Change dressing    Complete by:  As directed   You may change your dressing dressing daily with sterile 4 x 4 inch gauze dressing and paper tape.  Do not submerge the incision under water.     Constipation Prevention    Complete by:  As directed   Drink plenty of fluids.  Prune juice may be helpful.  You may use a stool softener, such as Colace (over the counter) 100 mg twice a day.  Use MiraLax (over the counter) for constipation as needed.     Diet - low sodium heart healthy    Complete by:  As directed      Discharge instructions    Complete by:  As directed   Pick up stool softner and laxative for home. Do not submerge incision under water. May shower. Continue to use ice for pain and swelling from surgery.  Total Hip Protocol.  Take Coumadin for three weeks for postoperative protocol and then the patient may resume their previous Coumadin home regimen.  The dose may need to be adjusted based upon the INR.  Please follow the INR and titrate Coumadin dose for a therapeutic range between 2.0 and 3.0 INR.  After completing the three weeks of Coumadin, the patient may resume their previous Coumadin home regimen.  Continue Lovenox injections until the INR is therapeutic at or greater than 2.0.  When INR reaches the therapeutic level of equal to or greater than 2.0, the patient may discontinue the Lovenox injections.  When discharged from the skilled rehab facility, please have the facility set up the patient's [place] prior to being released.  Also provide the patient with their medications at time of release from the facility to include their pain medication, the muscle relaxants, and their blood thinner medication.  If the patient is still at the rehab  facility at time of follow up appointment, please also assist the patient in arranging follow up appointment in our office and any transportation needs.     Do not sit on low chairs, stoools or toilet seats, as it may be difficult to get up from low surfaces    Complete by:  As directed      Driving restrictions    Complete by:  As directed   No driving until released by the physician.     Increase activity slowly as tolerated    Complete by:  As directed      Lifting  restrictions    Complete by:  As directed   No lifting until released by the physician.     Patient may shower    Complete by:  As directed   You may shower without a dressing once there is no drainage.  Do not wash over the wound.  If drainage remains, do not shower until drainage stops.     TED hose    Complete by:  As directed   Use stockings (TED hose) for 3 weeks on both leg(s).  You may remove them at night for sleeping.     Weight bearing as tolerated    Complete by:  As directed             Medication List    STOP taking these medications       NON FORMULARY      TAKE these medications       amLODipine 10 MG tablet  Commonly known as:  NORVASC  Take 10 mg by mouth every morning.     bisacodyl 10 MG suppository  Commonly known as:  DULCOLAX  Place 1 suppository (10 mg total) rectally daily as needed for moderate constipation.     clonazePAM 0.5 MG tablet  Commonly known as:  KLONOPIN  Take 0.5 mg by mouth 2 (two) times daily as needed for anxiety.     cloNIDine 0.1 MG tablet  Commonly known as:  CATAPRES  Take 0.1 mg by mouth See admin instructions. Half a tablet in the morning and 1 tab nightly.     DSS 100 MG Caps  Take 100 mg by mouth 2 (two) times daily.     enoxaparin 40 MG/0.4ML injection  Commonly known as:  LOVENOX  Inject 0.4 mLs (40 mg total) into the skin daily. Continue Lovenox injections until the INR is therapeutic at or greater than 2.0.  When INR reaches the therapeutic level  of equal to or greater than 2.0, the patient may discontinue the Lovenox injections.     hydrochlorothiazide 25 MG tablet  Commonly known as:  HYDRODIURIL  Take 25 mg by mouth every morning.     HYDROmorphone 2 MG tablet  Commonly known as:  DILAUDID  Take 0.5-1 tablets (1-2 mg total) by mouth every 4 (four) hours as needed for moderate pain or severe pain.     irbesartan 300 MG tablet  Commonly known as:  AVAPRO  Take 300 mg by mouth every morning.     methocarbamol 500 MG tablet  Commonly known as:  ROBAXIN  Take 1 tablet (500 mg total) by mouth every 6 (six) hours as needed for muscle spasms.     metoCLOPramide 5 MG tablet  Commonly known as:  REGLAN  Take 1-2 tablets (5-10 mg total) by mouth every 8 (eight) hours as needed for nausea (if ondansetron (ZOFRAN) ineffective.).     ondansetron 4 MG tablet  Commonly known as:  ZOFRAN  Take 1 tablet (4 mg total) by mouth every 6 (six) hours as needed for nausea.     polyethylene glycol packet  Commonly known as:  MIRALAX / GLYCOLAX  Take 17 g by mouth daily as needed for mild constipation.     potassium chloride SA 20 MEQ tablet  Commonly known as:  K-DUR,KLOR-CON  Take 20 mEq by mouth every morning.     pravastatin 40 MG tablet  Commonly known as:  PRAVACHOL  Take 40 mg by mouth every morning.     traMADol 50 MG tablet  Commonly known as:  ULTRAM  Take 1-2 tablets (50-100 mg total) by mouth every 6 (six) hours as needed (mild pain).     TYLENOL 500 MG tablet  Generic drug:  acetaminophen  Take 1,000 mg by mouth once as needed for moderate pain.     valACYclovir 1000 MG tablet  Commonly known as:  VALTREX  Take 1,000 mg by mouth 2 (two) times daily.     warfarin 2 MG tablet  Commonly known as:  COUMADIN  Take 1-2 tablets (2-4 mg total) by mouth as directed. Take Coumadin for three weeks for postoperative protocol and then the patient may resume their previous Coumadin home regimen.  The dose may need to be adjusted  based upon the INR.  Please follow the INR and titrate Coumadin dose for a therapeutic range between 2.0 and 3.0 INR.  After completing the three weeks of Coumadin, the patient may resume their previous Coumadin home regimen.           Follow-up Information   Follow up with Gearlean Alf, MD. Schedule an appointment as soon as possible for a visit on 01/03/2014. (Call office at [phone] to set up appointment next Thursday 01/03/2014 or Next Friday 01/04/2014.)    Specialty:  Orthopedic Surgery   Contact information:   [address] [place] [address] [phone]       Signed: [name], PA-C Orthopaedic Surgery 12/27/2013, 8:29 AM

## 2013-12-27 NOTE — Discharge Instructions (Signed)
Dr. Gaynelle Arabian Total Joint Specialist [facility] [address] [place], [place] [address] [phone]    ANTERIOR APPROACH TOTAL HIP REPLACEMENT POSTOPERATIVE DIRECTIONS   Hip Rehabilitation, Guidelines Following Surgery  The results of a hip operation are greatly improved after range of motion and muscle strengthening exercises. Follow all safety measures which are given to protect your hip. If any of these exercises cause increased pain or swelling in your joint, decrease the amount until you are comfortable again. Then slowly increase the exercises. Call your caregiver if you have problems or questions.  HOME CARE INSTRUCTIONS  Most of the following instructions are designed to prevent the dislocation of your new hip.  Remove items at home which could result in a fall. This includes throw rugs or furniture in walking pathways.  Continue medications as instructed at time of discharge.  You may have some home medications which will be placed on hold until you complete the course of blood thinner medication.  You may start showering once you are discharged home but do not submerge the incision under water. Just pat the incision dry and apply a dry gauze dressing on daily. Do not put on socks or shoes without following the instructions of your caregivers.  Sit on high chairs which makes it easier to stand.  Sit on chairs with arms. Use the chair arms to help push yourself up when arising.  Keep your leg on the side of the operation out in front of you when standing up.  Arrange for the use of a toilet seat elevator so you are not sitting low.    Walk with walker as instructed.  You may resume a sexual relationship in one month or when given the OK by your caregiver.  Use walker as long as suggested by your caregivers.  You may put full weight on your legs and walk as much as is comfortable. Avoid periods of inactivity such as sitting longer than an hour  when not asleep. This helps prevent blood clots.  You may return to work once you are cleared by Engineer, production.  Do not drive a car for 6 weeks or until released by your surgeon.  Do not drive while taking narcotics.  Wear elastic stockings for three weeks following surgery during the day but you may remove then at night.  Make sure you keep all of your appointments after your operation with all of your doctors and caregivers. You should call the office at the above phone number and make an appointment for approximately two weeks after the date of your surgery. Change the dressing daily and reapply a dry dressing each time. Please pick up a stool softener and laxative for home use as long as you are requiring pain medications.  Continue to use ice on the hip for pain and swelling from surgery. You may notice swelling that will progress down to the foot and ankle.  This is normal after surgery.  Elevate the leg when you are not up walking on it.   It is important for you to complete the blood thinner medication as prescribed by your doctor.  Continue to use the breathing machine which will help keep your temperature down.  It is common for your temperature to cycle up and down following surgery, especially at night when you are not up moving around and exerting yourself.  The breathing machine keeps your lungs expanded and your temperature down.  RANGE OF MOTION AND STRENGTHENING EXERCISES  These exercises are designed to help you keep full movement of your hip joint. Follow your caregiver's or physical therapist's instructions. Perform all exercises about fifteen times, three times per day or as directed. Exercise both hips, even if you have had only one joint replacement. These exercises can be done on a training (exercise) mat, on the floor, on a table or on a bed. Use whatever works the best and is most comfortable for you. Use music or television while you are exercising so that the exercises are a  pleasant break in your day. This will make your life better with the exercises acting as a break in routine you can look forward to.  Lying on your back, slowly slide your foot toward your buttocks, raising your knee up off the floor. Then slowly slide your foot back down until your leg is straight again.  Lying on your back spread your legs as far apart as you can without causing discomfort.  Lying on your side, raise your upper leg and foot straight up from the floor as far as is comfortable. Slowly lower the leg and repeat.  Lying on your back, tighten up the muscle in the front of your thigh (quadriceps muscles). You can do this by keeping your leg straight and trying to raise your heel off the floor. This helps strengthen the largest muscle supporting your knee.  Lying on your back, tighten up the muscles of your buttocks both with the legs straight and with the knee bent at a comfortable angle while keeping your heel on the floor.   SKILLED REHAB INSTRUCTIONS: If the patient is transferred to a skilled rehab facility following release from the hospital, a list of the current medications will be sent to the facility for the patient to continue.  When discharged from the skilled rehab facility, please have the facility set up the patient's [place] prior to being released. Also, the skilled facility will be responsible for providing the patient with their medications at time of release from the facility to include their pain medication, the muscle relaxants, and their blood thinner medication. If the patient is still at the rehab facility at time of the two week follow up appointment, the skilled rehab facility will also need to assist the patient in arranging follow up appointment in our office and any transportation needs.  MAKE SURE YOU:  Understand these instructions.  Will watch your condition.  Will get help right away if you are not doing well or get worse.  Pick up  stool softner and laxative for home. Do not submerge incision under water. May shower. Continue to use ice for pain and swelling from surgery. Total Hip Protocol.  Take Coumadin for three weeks for postoperative protocol and then the patient may resume their previous Coumadin home regimen.  The dose may need to be adjusted based upon the INR.  Please follow the INR and titrate Coumadin dose for a therapeutic range between 2.0 and 3.0 INR.  After completing the three weeks of Coumadin, the patient may resume their previous Coumadin home regimen.  Continue Lovenox injections until the INR is therapeutic at or greater than 2.0.  When INR reaches the therapeutic level of equal to or greater than 2.0, the patient may discontinue the Lovenox injections.  When discharged from the skilled rehab facility, please have the facility set up the patient's [place] prior to being released.  Also provide the patient with their medications at  time of release from the facility to include their pain medication, the muscle relaxants, and their blood thinner medication.  If the patient is still at the rehab facility at time of follow up appointment, please also assist the patient in arranging follow up appointment in our office and any transportation needs.

## 2013-12-27 NOTE — Progress Notes (Signed)
  LATE ENTRY NOTE Date of Service of Visit -  Wed. 12/26/2013  Subjective: 2 Days Post-Op Procedure(s) (LRB): RIGHT TOTAL HIP ARTHROPLASTY ANTERIOR APPROACH (Right) Patient reports pain as mild and moderate.   Patient seen in rounds with Dr. Wynelle Link. Patient is well, but has had some minor complaints of pain in the hip., requiring pain medications HGB down today to 7.7.  Will give two units of blood. Plan is to go Skilled nursing facility after hospital stay.  Objective: Vital signs in last 24 hours: Temp:  [97.6 F (36.4 C)-98.6 F (37 C)] 97.9 F (36.6 C) (10/01 1856) Pulse Rate:  [57-74] 58 (10/01 0632) Resp:  [16-18] 16 (10/01 0632) BP: (113-146)/(47-75) 129/61 mmHg (10/01 0632) SpO2:  [94 %-100 %] 94 % (10/01 3149)  Intake/Output from previous day:  Intake/Output Summary (Last 24 hours) at 12/27/13 0835 Last data filed at 12/27/13 0827  Gross per 24 hour  Intake    750 ml  Output   1425 ml  Net   -675 ml    Intake/Output this shift: Total I/O In: 240 [P.O.:240] Out: 350 [Urine:350]  Labs:  Recent Labs  12/25/13 0405 12/26/13 0402   HGB 9.6* 7.7*     Recent Labs  12/26/13 0402   WBC 16.9*   RBC 2.50*   HCT 21.9*   PLT 215     Recent Labs  12/26/13 0402   NA 127*   K 3.9   CL 93*   CO2 24   BUN 11   CREATININE 0.60   GLUCOSE 132*   CALCIUM 8.1*     Recent Labs  12/26/13 0402   INR 2.42*     EXAM General - Patient is Alert, Appropriate and Oriented Extremity - Neurovascular intact Sensation intact distally Dressing - dressing C/D/I Motor Function - intact, moving foot and toes well on exam.   Past Medical History  Diagnosis Date  . Pneumonia   . Osteoarthritis   . Hypertension   . Atrial fibrillation   . Bradycardia   . Complication of anesthesia   . PONV (postoperative nausea and vomiting)   . Dysrhythmia     A FIB  . Pacemaker   . Shortness of breath     WITH EXERTION  . History of skin cancer   . H/O hiatal hernia    . History of kidney stones   . History of transfusion   . Difficulty sleeping   . Anxiety     Assessment/Plan: 2 Days Post-Op Procedure(s) (LRB): RIGHT TOTAL HIP ARTHROPLASTY ANTERIOR APPROACH (Right) Principal Problem:   OA (osteoarthritis) of hip Active Problems:   Postoperative anemia due to acute blood loss   Postop Transfusion  Estimated body mass index is 26.08 kg/(m^2) as calculated from the following:   Height as of this encounter: 5\' 4"  (1.626 m).   Weight as of this encounter: 68.947 kg (152 lb). Up with therapy Discharge to SNF Blood today.  DVT Prophylaxis - Lovenox and Coumadin Weight Bearing As Tolerated right Leg   [name], PA-C Orthopaedic Surgery 12/27/2013, 8:35 AM

## 2013-12-27 NOTE — Progress Notes (Signed)
   Subjective: 3 Days Post-Op Procedure(s) (LRB): RIGHT TOTAL HIP ARTHROPLASTY ANTERIOR APPROACH (Right) Patient reports pain as mild.   Patient seen in rounds with Dr. Wynelle Link. Patient is well, and has had no acute complaints or problems Patient is ready to go to [facility].  Objective: Vital signs in last 24 hours: Temp:  [97.6 F (36.4 C)-98.6 F (37 C)] 97.9 F (36.6 C) (10/01 0109) Pulse Rate:  [57-74] 58 (10/01 0632) Resp:  [16-18] 16 (10/01 0632) BP: (113-146)/(47-75) 129/61 mmHg (10/01 0632) SpO2:  [94 %-100 %] 94 % (10/01 3235)  Intake/Output from previous day:  Intake/Output Summary (Last 24 hours) at 12/27/13 0817 Last data filed at 12/27/13 0156  Gross per 24 hour  Intake    510 ml  Output   1075 ml  Net   -565 ml    Labs:  Recent Labs  12/25/13 0405 12/26/13 0402 12/27/13 0420  HGB 9.6* 7.7* 10.7*    Recent Labs  12/26/13 0402 12/27/13 0420  WBC 16.9* 13.9*  RBC 2.50* 3.57*  HCT 21.9* 30.7*  PLT 215 197    Recent Labs  12/26/13 0402 12/27/13 0420  NA 127* 130*  K 3.9 3.4*  CL 93* 92*  CO2 24 28  BUN 11 15  CREATININE 0.60 0.63  GLUCOSE 132* 102*  CALCIUM 8.1* 8.7    Recent Labs  12/26/13 0402 12/27/13 0420  INR 2.42* 1.84*    EXAM: General - Patient is Alert, Appropriate and Oriented Extremity - Neurovascular intact Sensation intact distally Dorsiflexion/Plantar flexion intact Incision - clean, dry, no drainage Motor Function - intact, moving foot and toes well on exam.   Assessment/Plan: 3 Days Post-Op Procedure(s) (LRB): RIGHT TOTAL HIP ARTHROPLASTY ANTERIOR APPROACH (Right) Procedure(s) (LRB): RIGHT TOTAL HIP ARTHROPLASTY ANTERIOR APPROACH (Right) Past Medical History  Diagnosis Date  . Pneumonia   . Osteoarthritis   . Hypertension   . Atrial fibrillation   . Bradycardia   . Complication of anesthesia   . PONV (postoperative nausea and vomiting)   . Dysrhythmia     A FIB  . Pacemaker   .  Shortness of breath     WITH EXERTION  . History of skin cancer   . H/O hiatal hernia   . History of kidney stones   . History of transfusion   . Difficulty sleeping   . Anxiety    Principal Problem:   OA (osteoarthritis) of hip Active Problems:   Postoperative anemia due to acute blood loss   Postop Transfusion  Estimated body mass index is 26.08 kg/(m^2) as calculated from the following:   Height as of this encounter: 5\' 4"  (1.626 m).   Weight as of this encounter: 68.947 kg (152 lb). Discharge to SNF - [facility] [place] Follow up - in 2 weeks Activity - WBAT Disposition - Skilled nursing facility - [facility] [place] Upon Discharge - Good D/C Meds - See DC Summary DVT Prophylaxis - Lovenox and Coumadin, INR - 1.84 at time of transfer  [name], PA-C Orthopaedic Surgery 12/27/2013, 8:17 AM

## 2013-12-27 NOTE — Progress Notes (Signed)
Physical Therapy Treatment Patient Details Name: NOMA QUIJAS MRN: [id number] DOB: 1925-06-03 Today's Date: 12/27/2013    History of Present Illness s/ DA R  THA; PMHx: bil TKAs    PT Comments    POD # 3 pt OOB in recliner.  Assisted with amb in hallway then performing THR TE's followed by ICE.  Follow Up Recommendations  SNF ([facility])     Equipment Recommendations       Recommendations for Other Services       Precautions / Restrictions Precautions Precautions: Fall Restrictions Weight Bearing Restrictions: No Other Position/Activity Restrictions: WBAT    Mobility  Bed Mobility               General bed mobility comments: Pt OOB in recliner  Transfers Overall transfer level: Needs assistance Equipment used: Rolling walker (2 wheeled) Transfers: Sit to/from Stand Sit to Stand: Min guard         General transfer comment: cues for UE/LE placement plus increased time  Ambulation/Gait Ambulation/Gait assistance: Min guard;Min assist Ambulation Distance (Feet): 42 Feet Assistive device: Rolling walker (2 wheeled) Gait Pattern/deviations: Step-to pattern Gait velocity: decreased   General Gait Details: verbal cues for RW position from self   Stairs            Wheelchair Mobility    Modified Rankin (Stroke Patients Only)       Balance                                    Cognition                            Exercises      General Comments        Pertinent Vitals/Pain Pain Assessment: 0-10 Pain Score: 3  Pain Location: R hip Pain Descriptors / Indicators: Sore Pain Intervention(s): Monitored during session;Repositioned;Ice applied    Home Living                      Prior Function            PT Goals (current goals can now be found in the care plan section) Progress towards PT goals: Progressing toward goals    Frequency  7X/week    PT Plan      Co-evaluation              End of Session Equipment Utilized During Treatment: Gait belt Activity Tolerance: Patient tolerated treatment well Patient left: in chair;with call bell/phone within reach     Time: 1130-1159 PT Time Calculation (min): 29 min  Charges:  $Gait Training: 8-22 mins $Therapeutic Exercise: 8-22 mins                    G Codes:      Rica Koyanagi  PTA [facility]  Acute  Rehab Pager      [phone]

## 2013-12-27 NOTE — Progress Notes (Signed)
Clinical Social Work Department CLINICAL SOCIAL WORK PLACEMENT NOTE 12/27/2013  Patient:  [name], [name]  Account Number:  [id number] Admit date:  12/24/2013  Clinical Social Worker:  Werner Lean, LCSW  Date/time:  12/26/2013 12:35 PM  Clinical Social Work is seeking post-discharge placement for this patient at the following level of care:   SKILLED NURSING   (*CSW will update this form in Epic as items are completed)     Patient/family provided with [facility] Department of Clinical Social Work's list of facilities offering this level of care within the geographic area requested by the patient (or if unable, by the patient's family).  12/26/2013  Patient/family informed of their freedom to choose among providers that offer the needed level of care, that participate in Medicare, Medicaid or managed care program needed by the patient, have an available bed and are willing to accept the patient.    Patient/family informed of [facility]' ownership interest in [facility], as well as of the fact that they are under no obligation to receive care at this facility.  PASARR submitted to EDS on 12/26/2013 PASARR number received on 12/26/2013  FL2 transmitted to all facilities in geographic area requested by pt/family on  12/26/2013 FL2 transmitted to all facilities within larger geographic area on   Patient informed that his/her managed care company has contracts with or will negotiate with  certain facilities, including the following:     Patient/family informed of bed offers received:  12/26/2013 Patient chooses bed at [facility] [place] Physician recommends and patient chooses bed at    Patient to be transferred to [facility] on  12/27/2013 Patient to be transferred to facility by [facility] Patient and family notified of transfer on 12/27/2013 Name of family member notified:  SON  The following physician request were  entered in Epic:   Additional Comments: Pt / son are in agreement with d/c to SNF today. [facility] transport required. NSG reviewed d/c summary, scripts, avs. Scripts are included in d/c packet.  Werner Lean LCSW [phone]

## 2013-12-28 NOTE — Progress Notes (Signed)
Attempted to call report to [facility], left on hold, then disconnected.   Bethann Punches RN

## 2013-12-30 DIAGNOSIS — D509 Iron deficiency anemia, unspecified: Secondary | ICD-10-CM | POA: Diagnosis not present

## 2013-12-30 DIAGNOSIS — I480 Paroxysmal atrial fibrillation: Secondary | ICD-10-CM | POA: Diagnosis not present

## 2013-12-30 DIAGNOSIS — Z96641 Presence of right artificial hip joint: Secondary | ICD-10-CM | POA: Diagnosis not present

## 2013-12-30 DIAGNOSIS — I10 Essential (primary) hypertension: Secondary | ICD-10-CM | POA: Diagnosis not present

## 2014-01-06 DIAGNOSIS — R269 Unspecified abnormalities of gait and mobility: Secondary | ICD-10-CM | POA: Diagnosis not present

## 2014-01-06 DIAGNOSIS — Z5181 Encounter for therapeutic drug level monitoring: Secondary | ICD-10-CM | POA: Diagnosis not present

## 2014-01-06 DIAGNOSIS — I10 Essential (primary) hypertension: Secondary | ICD-10-CM | POA: Diagnosis not present

## 2014-01-06 DIAGNOSIS — Z471 Aftercare following joint replacement surgery: Secondary | ICD-10-CM | POA: Diagnosis not present

## 2014-01-06 DIAGNOSIS — Z96641 Presence of right artificial hip joint: Secondary | ICD-10-CM | POA: Diagnosis not present

## 2014-01-06 DIAGNOSIS — Z7901 Long term (current) use of anticoagulants: Secondary | ICD-10-CM | POA: Diagnosis not present

## 2014-01-07 DIAGNOSIS — Z5181 Encounter for therapeutic drug level monitoring: Secondary | ICD-10-CM | POA: Diagnosis not present

## 2014-01-07 DIAGNOSIS — R269 Unspecified abnormalities of gait and mobility: Secondary | ICD-10-CM | POA: Diagnosis not present

## 2014-01-07 DIAGNOSIS — Z7901 Long term (current) use of anticoagulants: Secondary | ICD-10-CM | POA: Diagnosis not present

## 2014-01-07 DIAGNOSIS — Z471 Aftercare following joint replacement surgery: Secondary | ICD-10-CM | POA: Diagnosis not present

## 2014-01-07 DIAGNOSIS — I10 Essential (primary) hypertension: Secondary | ICD-10-CM | POA: Diagnosis not present

## 2014-01-07 DIAGNOSIS — Z96641 Presence of right artificial hip joint: Secondary | ICD-10-CM | POA: Diagnosis not present

## 2014-01-08 DIAGNOSIS — Z471 Aftercare following joint replacement surgery: Secondary | ICD-10-CM | POA: Diagnosis not present

## 2014-01-08 DIAGNOSIS — R269 Unspecified abnormalities of gait and mobility: Secondary | ICD-10-CM | POA: Diagnosis not present

## 2014-01-08 DIAGNOSIS — I10 Essential (primary) hypertension: Secondary | ICD-10-CM | POA: Diagnosis not present

## 2014-01-08 DIAGNOSIS — Z96641 Presence of right artificial hip joint: Secondary | ICD-10-CM | POA: Diagnosis not present

## 2014-01-08 DIAGNOSIS — Z7901 Long term (current) use of anticoagulants: Secondary | ICD-10-CM | POA: Diagnosis not present

## 2014-01-08 DIAGNOSIS — Z5181 Encounter for therapeutic drug level monitoring: Secondary | ICD-10-CM | POA: Diagnosis not present

## 2014-01-10 DIAGNOSIS — I10 Essential (primary) hypertension: Secondary | ICD-10-CM | POA: Diagnosis not present

## 2014-01-10 DIAGNOSIS — Z5181 Encounter for therapeutic drug level monitoring: Secondary | ICD-10-CM | POA: Diagnosis not present

## 2014-01-10 DIAGNOSIS — R269 Unspecified abnormalities of gait and mobility: Secondary | ICD-10-CM | POA: Diagnosis not present

## 2014-01-10 DIAGNOSIS — Z96641 Presence of right artificial hip joint: Secondary | ICD-10-CM | POA: Diagnosis not present

## 2014-01-10 DIAGNOSIS — Z7901 Long term (current) use of anticoagulants: Secondary | ICD-10-CM | POA: Diagnosis not present

## 2014-01-10 DIAGNOSIS — Z471 Aftercare following joint replacement surgery: Secondary | ICD-10-CM | POA: Diagnosis not present

## 2014-01-11 DIAGNOSIS — I10 Essential (primary) hypertension: Secondary | ICD-10-CM | POA: Diagnosis not present

## 2014-01-11 DIAGNOSIS — Z471 Aftercare following joint replacement surgery: Secondary | ICD-10-CM | POA: Diagnosis not present

## 2014-01-11 DIAGNOSIS — R269 Unspecified abnormalities of gait and mobility: Secondary | ICD-10-CM | POA: Diagnosis not present

## 2014-01-11 DIAGNOSIS — Z96641 Presence of right artificial hip joint: Secondary | ICD-10-CM | POA: Diagnosis not present

## 2014-01-11 DIAGNOSIS — Z7901 Long term (current) use of anticoagulants: Secondary | ICD-10-CM | POA: Diagnosis not present

## 2014-01-11 DIAGNOSIS — Z5181 Encounter for therapeutic drug level monitoring: Secondary | ICD-10-CM | POA: Diagnosis not present

## 2014-01-14 DIAGNOSIS — Z7901 Long term (current) use of anticoagulants: Secondary | ICD-10-CM | POA: Diagnosis not present

## 2014-01-14 DIAGNOSIS — Z96641 Presence of right artificial hip joint: Secondary | ICD-10-CM | POA: Diagnosis not present

## 2014-01-14 DIAGNOSIS — I10 Essential (primary) hypertension: Secondary | ICD-10-CM | POA: Diagnosis not present

## 2014-01-14 DIAGNOSIS — Z471 Aftercare following joint replacement surgery: Secondary | ICD-10-CM | POA: Diagnosis not present

## 2014-01-14 DIAGNOSIS — Z5181 Encounter for therapeutic drug level monitoring: Secondary | ICD-10-CM | POA: Diagnosis not present

## 2014-01-14 DIAGNOSIS — R269 Unspecified abnormalities of gait and mobility: Secondary | ICD-10-CM | POA: Diagnosis not present

## 2014-01-15 DIAGNOSIS — Z5181 Encounter for therapeutic drug level monitoring: Secondary | ICD-10-CM | POA: Diagnosis not present

## 2014-01-15 DIAGNOSIS — R269 Unspecified abnormalities of gait and mobility: Secondary | ICD-10-CM | POA: Diagnosis not present

## 2014-01-15 DIAGNOSIS — Z471 Aftercare following joint replacement surgery: Secondary | ICD-10-CM | POA: Diagnosis not present

## 2014-01-15 DIAGNOSIS — I10 Essential (primary) hypertension: Secondary | ICD-10-CM | POA: Diagnosis not present

## 2014-01-15 DIAGNOSIS — Z7901 Long term (current) use of anticoagulants: Secondary | ICD-10-CM | POA: Diagnosis not present

## 2014-01-15 DIAGNOSIS — Z96641 Presence of right artificial hip joint: Secondary | ICD-10-CM | POA: Diagnosis not present

## 2014-01-17 DIAGNOSIS — Z7901 Long term (current) use of anticoagulants: Secondary | ICD-10-CM | POA: Diagnosis not present

## 2014-01-17 DIAGNOSIS — Z96641 Presence of right artificial hip joint: Secondary | ICD-10-CM | POA: Diagnosis not present

## 2014-01-17 DIAGNOSIS — I10 Essential (primary) hypertension: Secondary | ICD-10-CM | POA: Diagnosis not present

## 2014-01-17 DIAGNOSIS — Z471 Aftercare following joint replacement surgery: Secondary | ICD-10-CM | POA: Diagnosis not present

## 2014-01-17 DIAGNOSIS — Z5181 Encounter for therapeutic drug level monitoring: Secondary | ICD-10-CM | POA: Diagnosis not present

## 2014-01-17 DIAGNOSIS — R269 Unspecified abnormalities of gait and mobility: Secondary | ICD-10-CM | POA: Diagnosis not present

## 2014-01-18 DIAGNOSIS — Z471 Aftercare following joint replacement surgery: Secondary | ICD-10-CM | POA: Diagnosis not present

## 2014-01-18 DIAGNOSIS — Z5181 Encounter for therapeutic drug level monitoring: Secondary | ICD-10-CM | POA: Diagnosis not present

## 2014-01-18 DIAGNOSIS — R269 Unspecified abnormalities of gait and mobility: Secondary | ICD-10-CM | POA: Diagnosis not present

## 2014-01-18 DIAGNOSIS — Z96641 Presence of right artificial hip joint: Secondary | ICD-10-CM | POA: Diagnosis not present

## 2014-01-18 DIAGNOSIS — I10 Essential (primary) hypertension: Secondary | ICD-10-CM | POA: Diagnosis not present

## 2014-01-18 DIAGNOSIS — Z7901 Long term (current) use of anticoagulants: Secondary | ICD-10-CM | POA: Diagnosis not present

## 2014-01-21 DIAGNOSIS — Z7901 Long term (current) use of anticoagulants: Secondary | ICD-10-CM | POA: Diagnosis not present

## 2014-01-21 DIAGNOSIS — R269 Unspecified abnormalities of gait and mobility: Secondary | ICD-10-CM | POA: Diagnosis not present

## 2014-01-21 DIAGNOSIS — I10 Essential (primary) hypertension: Secondary | ICD-10-CM | POA: Diagnosis not present

## 2014-01-21 DIAGNOSIS — Z471 Aftercare following joint replacement surgery: Secondary | ICD-10-CM | POA: Diagnosis not present

## 2014-01-21 DIAGNOSIS — Z96641 Presence of right artificial hip joint: Secondary | ICD-10-CM | POA: Diagnosis not present

## 2014-01-21 DIAGNOSIS — Z5181 Encounter for therapeutic drug level monitoring: Secondary | ICD-10-CM | POA: Diagnosis not present

## 2014-01-22 DIAGNOSIS — Z471 Aftercare following joint replacement surgery: Secondary | ICD-10-CM | POA: Diagnosis not present

## 2014-01-22 DIAGNOSIS — Z96641 Presence of right artificial hip joint: Secondary | ICD-10-CM | POA: Diagnosis not present

## 2014-01-24 DIAGNOSIS — I10 Essential (primary) hypertension: Secondary | ICD-10-CM | POA: Diagnosis not present

## 2014-01-24 DIAGNOSIS — R269 Unspecified abnormalities of gait and mobility: Secondary | ICD-10-CM | POA: Diagnosis not present

## 2014-01-24 DIAGNOSIS — Z5181 Encounter for therapeutic drug level monitoring: Secondary | ICD-10-CM | POA: Diagnosis not present

## 2014-01-24 DIAGNOSIS — Z471 Aftercare following joint replacement surgery: Secondary | ICD-10-CM | POA: Diagnosis not present

## 2014-01-24 DIAGNOSIS — Z7901 Long term (current) use of anticoagulants: Secondary | ICD-10-CM | POA: Diagnosis not present

## 2014-01-24 DIAGNOSIS — Z96641 Presence of right artificial hip joint: Secondary | ICD-10-CM | POA: Diagnosis not present

## 2014-01-31 DIAGNOSIS — Z23 Encounter for immunization: Secondary | ICD-10-CM | POA: Diagnosis not present

## 2014-02-20 DIAGNOSIS — I482 Chronic atrial fibrillation: Secondary | ICD-10-CM | POA: Diagnosis not present

## 2014-02-20 DIAGNOSIS — Z7901 Long term (current) use of anticoagulants: Secondary | ICD-10-CM | POA: Diagnosis not present

## 2014-03-26 ENCOUNTER — Encounter: Payer: Self-pay | Admitting: Internal Medicine

## 2014-03-26 DIAGNOSIS — R001 Bradycardia, unspecified: Secondary | ICD-10-CM | POA: Diagnosis not present

## 2014-03-27 DIAGNOSIS — Z7901 Long term (current) use of anticoagulants: Secondary | ICD-10-CM | POA: Diagnosis not present

## 2014-03-27 DIAGNOSIS — I482 Chronic atrial fibrillation: Secondary | ICD-10-CM | POA: Diagnosis not present

## 2014-04-17 DIAGNOSIS — M1990 Unspecified osteoarthritis, unspecified site: Secondary | ICD-10-CM | POA: Diagnosis not present

## 2014-04-17 DIAGNOSIS — I10 Essential (primary) hypertension: Secondary | ICD-10-CM | POA: Diagnosis not present

## 2014-04-17 DIAGNOSIS — Z7901 Long term (current) use of anticoagulants: Secondary | ICD-10-CM | POA: Diagnosis not present

## 2014-04-17 DIAGNOSIS — E785 Hyperlipidemia, unspecified: Secondary | ICD-10-CM | POA: Diagnosis not present

## 2014-04-17 DIAGNOSIS — I482 Chronic atrial fibrillation: Secondary | ICD-10-CM | POA: Diagnosis not present

## 2014-04-17 DIAGNOSIS — Z6825 Body mass index (BMI) 25.0-25.9, adult: Secondary | ICD-10-CM | POA: Diagnosis not present

## 2014-05-02 DIAGNOSIS — Z7901 Long term (current) use of anticoagulants: Secondary | ICD-10-CM | POA: Diagnosis not present

## 2014-05-02 DIAGNOSIS — I482 Chronic atrial fibrillation: Secondary | ICD-10-CM | POA: Diagnosis not present

## 2014-05-28 DIAGNOSIS — R109 Unspecified abdominal pain: Secondary | ICD-10-CM | POA: Diagnosis not present

## 2014-05-28 DIAGNOSIS — Z6826 Body mass index (BMI) 26.0-26.9, adult: Secondary | ICD-10-CM | POA: Diagnosis not present

## 2014-05-30 DIAGNOSIS — R109 Unspecified abdominal pain: Secondary | ICD-10-CM | POA: Diagnosis not present

## 2014-05-30 DIAGNOSIS — I482 Chronic atrial fibrillation: Secondary | ICD-10-CM | POA: Diagnosis not present

## 2014-05-30 DIAGNOSIS — Z7901 Long term (current) use of anticoagulants: Secondary | ICD-10-CM | POA: Diagnosis not present

## 2014-06-04 DIAGNOSIS — I482 Chronic atrial fibrillation: Secondary | ICD-10-CM | POA: Diagnosis not present

## 2014-06-04 DIAGNOSIS — R109 Unspecified abdominal pain: Secondary | ICD-10-CM | POA: Diagnosis not present

## 2014-06-04 DIAGNOSIS — Z7901 Long term (current) use of anticoagulants: Secondary | ICD-10-CM | POA: Diagnosis not present

## 2014-06-04 DIAGNOSIS — Z6826 Body mass index (BMI) 26.0-26.9, adult: Secondary | ICD-10-CM | POA: Diagnosis not present

## 2014-06-25 ENCOUNTER — Encounter: Payer: Self-pay | Admitting: Internal Medicine

## 2014-06-25 DIAGNOSIS — R001 Bradycardia, unspecified: Secondary | ICD-10-CM | POA: Diagnosis not present

## 2014-07-04 DIAGNOSIS — Z7901 Long term (current) use of anticoagulants: Secondary | ICD-10-CM | POA: Diagnosis not present

## 2014-07-04 DIAGNOSIS — I482 Chronic atrial fibrillation: Secondary | ICD-10-CM | POA: Diagnosis not present

## 2014-07-22 DIAGNOSIS — D100 Benign neoplasm of lip: Secondary | ICD-10-CM | POA: Diagnosis not present

## 2014-07-23 DIAGNOSIS — D100 Benign neoplasm of lip: Secondary | ICD-10-CM | POA: Diagnosis not present

## 2014-08-06 DIAGNOSIS — I482 Chronic atrial fibrillation: Secondary | ICD-10-CM | POA: Diagnosis not present

## 2014-08-06 DIAGNOSIS — Z7901 Long term (current) use of anticoagulants: Secondary | ICD-10-CM | POA: Diagnosis not present

## 2014-09-03 DIAGNOSIS — I482 Chronic atrial fibrillation: Secondary | ICD-10-CM | POA: Diagnosis not present

## 2014-09-03 DIAGNOSIS — Z7901 Long term (current) use of anticoagulants: Secondary | ICD-10-CM | POA: Diagnosis not present

## 2014-09-11 DIAGNOSIS — H3530 Unspecified macular degeneration: Secondary | ICD-10-CM | POA: Diagnosis not present

## 2014-09-26 ENCOUNTER — Encounter: Payer: Self-pay | Admitting: Internal Medicine

## 2014-09-26 ENCOUNTER — Ambulatory Visit (INDEPENDENT_AMBULATORY_CARE_PROVIDER_SITE_OTHER): Payer: Medicare Other | Admitting: Internal Medicine

## 2014-09-26 VITALS — BP 120/50 | HR 61 | Ht 64.0 in | Wt 153.2 lb

## 2014-09-26 DIAGNOSIS — I4891 Unspecified atrial fibrillation: Secondary | ICD-10-CM

## 2014-09-26 DIAGNOSIS — I10 Essential (primary) hypertension: Secondary | ICD-10-CM

## 2014-09-26 DIAGNOSIS — Z950 Presence of cardiac pacemaker: Secondary | ICD-10-CM | POA: Diagnosis not present

## 2014-09-26 LAB — CUP PACEART INCLINIC DEVICE CHECK
Battery Impedance: 1500 Ohm
Brady Statistic RV Percent Paced: 72 %
Lead Channel Impedance Value: 365 Ohm
Lead Channel Pacing Threshold Amplitude: 1 V
Lead Channel Sensing Intrinsic Amplitude: 16.6 mV
Lead Channel Setting Pacing Pulse Width: 0.4 ms
Lead Channel Setting Sensing Sensitivity: 2 mV
MDC IDC MSMT BATTERY VOLTAGE: 2.78 V
MDC IDC MSMT LEADCHNL RV PACING THRESHOLD PULSEWIDTH: 0.4 ms
MDC IDC PG SERIAL: 1664242
MDC IDC SESS DTM: 20160630113401
Pulse Gen Model: 5156

## 2014-09-26 NOTE — Assessment & Plan Note (Signed)
Her St. Jude device is working normally. Will recheck in several months. 

## 2014-09-26 NOTE — Progress Notes (Signed)
HPI [name] returns today for followup. She is a pleasant 81 yo woman with a h/o symptomatic bradycardia, status post permanent pacemaker insertion. She has hypertension. In the last year, she has done well. She denies chest pain, shortness of breath, or syncope. No peripheral edema. In the interim, she has undergone hip replacement. Allergies  Allergen Reactions  . Morphine And Related Nausea And Vomiting     Current Outpatient Prescriptions  Medication Sig Dispense Refill  . amLODipine (NORVASC) 10 MG tablet Take 10 mg by mouth every morning.     . cloNIDine (CATAPRES) 0.1 MG tablet Take 0.1 mg by mouth See admin instructions. Half a tablet in the morning and 1 tab nightly.    . hydrochlorothiazide 25 MG tablet Take 25 mg by mouth every morning.     . irbesartan (AVAPRO) 300 MG tablet Take 300 mg by mouth every morning.    . potassium chloride SA (K-DUR,KLOR-CON) 20 MEQ tablet Take 20 mEq by mouth every morning.     . pravastatin (PRAVACHOL) 40 MG tablet Take 40 mg by mouth every morning.     . warfarin (COUMADIN) 2 MG tablet Take 1-2 tablets (2-4 mg total) by mouth as directed. Take Coumadin for three weeks for postoperative protocol and then the patient may resume their previous Coumadin home regimen.  The dose may need to be adjusted based upon the INR.  Please follow the INR and titrate Coumadin dose for a therapeutic range between 2.0 and 3.0 INR.  After completing the three weeks of Coumadin, the patient may resume their previous Coumadin home regimen. 30 tablet 0   No current facility-administered medications for this visit.     Past Medical History  Diagnosis Date  . Pneumonia   . Osteoarthritis   . Hypertension   . Atrial fibrillation   . Bradycardia   . Complication of anesthesia   . PONV (postoperative nausea and vomiting)   . Dysrhythmia     A FIB  . Pacemaker   . Shortness of breath     WITH EXERTION  . History of skin cancer   . H/O hiatal hernia   . History  of kidney stones   . History of transfusion   . Difficulty sleeping   . Anxiety     ROS:   All systems reviewed and negative except as noted in the HPI.   Past Surgical History  Procedure Laterality Date  . Microdiskectomy  05/2004    Left L4-L5  . Arthroplasty  1994    Left total knee  . Arthroplasty  2000    Total right knee  . Dg selected hsg gdc only  2002    Dilation  . Appendectomy  1960  . Total abdominal hysterectomy  1969  . Pacemaker insertion    . Joint replacement      knee replacement  . Cholecystectomy    . Back surgery    . Total hip arthroplasty Right 12/24/2013    Procedure: RIGHT TOTAL HIP ARTHROPLASTY ANTERIOR APPROACH;  Surgeon: Gearlean Alf, MD;  Location: [facility];  Service: Orthopedics;  Laterality: Right;     Family History  Problem Relation Age of Onset  . Heart disease Mother   . Leukemia Father      History   Social History  . Marital Status: Married    Spouse Name: N/A  . Number of Children: N/A  . Years of Education: N/A   Occupational History  . Not on file.  Social History Main Topics  . Smoking status: Former Smoker    Quit date: 12/18/1973  . Smokeless tobacco: Not on file     Comment: Smoker for 25 years and quit at around age 81  . Alcohol Use: No  . Drug Use: No  . Sexual Activity: Not on file   Other Topics Concern  . Not on file   Social History Narrative   Married, husband's name is Warner Mccreedy, they have been married since 31   2 sons and 4 grandchildren           BP 120/50 mmHg  Pulse 61  Ht 5\' 4"  (1.626 m)  Wt 153 lb 3.2 oz (69.491 kg)  BMI 26.28 kg/m2  Physical Exam:  Well appearing 81 year old woman,NAD HEENT: Unremarkable Neck:  5 cm JVD, no thyromegally Back:  No CVA tenderness Lungs:  Clear with no wheezes HEART:  Regular rate rhythm, no murmurs, no rubs, no clicks Abd:  soft, positive bowel sounds, no organomegally, no rebound, no guarding Ext:  2 plus pulses, no edema, no cyanosis, no  clubbing Skin:  No rashes no nodules Neuro:  CN II through XII intact, motor grossly intact   DEVICE  Normal device function.  See PaceArt for details.   Assess/Plan:

## 2014-09-26 NOTE — Assessment & Plan Note (Signed)
Her blood pressure is well controlled. 

## 2014-09-26 NOTE — Patient Instructions (Signed)
Medication Instructions:  None ordered  Labwork: None ordered  Testing/Procedures: None ordered  Follow-Up: Your physician wants you to follow-up in: 6 months in device clinic and 12 months with Dr Knox Saliva will receive a reminder letter in the mail two months in advance. If you don't receive a letter, please call our office to schedule the follow-up appointment.   Any Other Special Instructions Will Be Listed Below (If Applicable).

## 2014-10-03 DIAGNOSIS — I482 Chronic atrial fibrillation: Secondary | ICD-10-CM | POA: Diagnosis not present

## 2014-10-03 DIAGNOSIS — Z7901 Long term (current) use of anticoagulants: Secondary | ICD-10-CM | POA: Diagnosis not present

## 2014-10-10 ENCOUNTER — Encounter: Payer: Self-pay | Admitting: Internal Medicine

## 2014-10-14 DIAGNOSIS — I10 Essential (primary) hypertension: Secondary | ICD-10-CM | POA: Diagnosis not present

## 2014-10-14 DIAGNOSIS — E871 Hypo-osmolality and hyponatremia: Secondary | ICD-10-CM | POA: Diagnosis not present

## 2014-10-14 DIAGNOSIS — D692 Other nonthrombocytopenic purpura: Secondary | ICD-10-CM | POA: Diagnosis not present

## 2014-10-14 DIAGNOSIS — H8110 Benign paroxysmal vertigo, unspecified ear: Secondary | ICD-10-CM | POA: Diagnosis not present

## 2014-10-14 DIAGNOSIS — E785 Hyperlipidemia, unspecified: Secondary | ICD-10-CM | POA: Diagnosis not present

## 2014-10-14 DIAGNOSIS — Z1389 Encounter for screening for other disorder: Secondary | ICD-10-CM | POA: Diagnosis not present

## 2014-10-14 DIAGNOSIS — Z6826 Body mass index (BMI) 26.0-26.9, adult: Secondary | ICD-10-CM | POA: Diagnosis not present

## 2014-10-14 DIAGNOSIS — I482 Chronic atrial fibrillation: Secondary | ICD-10-CM | POA: Diagnosis not present

## 2014-10-14 DIAGNOSIS — M1990 Unspecified osteoarthritis, unspecified site: Secondary | ICD-10-CM | POA: Diagnosis not present

## 2014-11-07 DIAGNOSIS — Z7901 Long term (current) use of anticoagulants: Secondary | ICD-10-CM | POA: Diagnosis not present

## 2014-11-07 DIAGNOSIS — I482 Chronic atrial fibrillation: Secondary | ICD-10-CM | POA: Diagnosis not present

## 2014-11-12 ENCOUNTER — Emergency Department (HOSPITAL_COMMUNITY)
Admission: EM | Admit: 2014-11-12 | Discharge: 2014-11-12 | Disposition: A | Discharge status: Home / Self Care | Payer: Medicare Other | Attending: Emergency Medicine | Admitting: Emergency Medicine

## 2014-11-12 ENCOUNTER — Emergency Department (HOSPITAL_COMMUNITY): Payer: Medicare Other

## 2014-11-12 ENCOUNTER — Encounter (HOSPITAL_COMMUNITY): Payer: Self-pay | Admitting: Emergency Medicine

## 2014-11-12 DIAGNOSIS — Z87891 Personal history of nicotine dependence: Secondary | ICD-10-CM | POA: Diagnosis not present

## 2014-11-12 DIAGNOSIS — Z8719 Personal history of other diseases of the digestive system: Secondary | ICD-10-CM | POA: Insufficient documentation

## 2014-11-12 DIAGNOSIS — Z8701 Personal history of pneumonia (recurrent): Secondary | ICD-10-CM | POA: Diagnosis not present

## 2014-11-12 DIAGNOSIS — R202 Paresthesia of skin: Secondary | ICD-10-CM | POA: Diagnosis not present

## 2014-11-12 DIAGNOSIS — Z85828 Personal history of other malignant neoplasm of skin: Secondary | ICD-10-CM | POA: Insufficient documentation

## 2014-11-12 DIAGNOSIS — I4891 Unspecified atrial fibrillation: Secondary | ICD-10-CM | POA: Diagnosis not present

## 2014-11-12 DIAGNOSIS — I10 Essential (primary) hypertension: Secondary | ICD-10-CM | POA: Diagnosis not present

## 2014-11-12 DIAGNOSIS — Z8659 Personal history of other mental and behavioral disorders: Secondary | ICD-10-CM | POA: Insufficient documentation

## 2014-11-12 DIAGNOSIS — M79601 Pain in right arm: Secondary | ICD-10-CM | POA: Insufficient documentation

## 2014-11-12 DIAGNOSIS — R404 Transient alteration of awareness: Secondary | ICD-10-CM | POA: Diagnosis not present

## 2014-11-12 DIAGNOSIS — G459 Transient cerebral ischemic attack, unspecified: Secondary | ICD-10-CM

## 2014-11-12 DIAGNOSIS — R531 Weakness: Secondary | ICD-10-CM | POA: Diagnosis not present

## 2014-11-12 DIAGNOSIS — Z950 Presence of cardiac pacemaker: Secondary | ICD-10-CM | POA: Insufficient documentation

## 2014-11-12 DIAGNOSIS — Z79899 Other long term (current) drug therapy: Secondary | ICD-10-CM | POA: Insufficient documentation

## 2014-11-12 DIAGNOSIS — Z87442 Personal history of urinary calculi: Secondary | ICD-10-CM | POA: Insufficient documentation

## 2014-11-12 DIAGNOSIS — R079 Chest pain, unspecified: Secondary | ICD-10-CM | POA: Diagnosis present

## 2014-11-12 LAB — I-STAT TROPONIN, ED: Troponin i, poc: 0 ng/mL (ref 0.00–0.08)

## 2014-11-12 LAB — URINALYSIS, ROUTINE W REFLEX MICROSCOPIC
Bilirubin Urine: NEGATIVE
GLUCOSE, UA: NEGATIVE mg/dL
HGB URINE DIPSTICK: NEGATIVE
KETONES UR: NEGATIVE mg/dL
Nitrite: NEGATIVE
PH: 7 (ref 5.0–8.0)
PROTEIN: NEGATIVE mg/dL
Specific Gravity, Urine: 1.008 (ref 1.005–1.030)
Urobilinogen, UA: 0.2 mg/dL (ref 0.0–1.0)

## 2014-11-12 LAB — DIFFERENTIAL
BASOS ABS: 0 10*3/uL (ref 0.0–0.1)
BASOS PCT: 0 % (ref 0–1)
EOS ABS: 0.1 10*3/uL (ref 0.0–0.7)
EOS PCT: 1 % (ref 0–5)
LYMPHS ABS: 0.8 10*3/uL (ref 0.7–4.0)
Lymphocytes Relative: 12 % (ref 12–46)
MONO ABS: 0.7 10*3/uL (ref 0.1–1.0)
MONOS PCT: 10 % (ref 3–12)
Neutro Abs: 5.4 10*3/uL (ref 1.7–7.7)
Neutrophils Relative %: 77 % (ref 43–77)

## 2014-11-12 LAB — URINE MICROSCOPIC-ADD ON

## 2014-11-12 LAB — HEPATIC FUNCTION PANEL
ALK PHOS: 73 U/L (ref 38–126)
ALT: 15 U/L (ref 14–54)
AST: 22 U/L (ref 15–41)
Albumin: 3.5 g/dL (ref 3.5–5.0)
BILIRUBIN INDIRECT: 0.8 mg/dL (ref 0.3–0.9)
BILIRUBIN TOTAL: 1 mg/dL (ref 0.3–1.2)
Bilirubin, Direct: 0.2 mg/dL (ref 0.1–0.5)
Total Protein: 6.5 g/dL (ref 6.5–8.1)

## 2014-11-12 LAB — CBC
HEMATOCRIT: 38 % (ref 36.0–46.0)
Hemoglobin: 12.9 g/dL (ref 12.0–15.0)
MCH: 30.8 pg (ref 26.0–34.0)
MCHC: 33.9 g/dL (ref 30.0–36.0)
MCV: 90.7 fL (ref 78.0–100.0)
PLATELETS: 254 10*3/uL (ref 150–400)
RBC: 4.19 MIL/uL (ref 3.87–5.11)
RDW: 13.6 % (ref 11.5–15.5)
WBC: 7 10*3/uL (ref 4.0–10.5)

## 2014-11-12 LAB — BASIC METABOLIC PANEL
Anion gap: 10 (ref 5–15)
BUN: 9 mg/dL (ref 6–20)
CHLORIDE: 97 mmol/L — AB (ref 101–111)
CO2: 28 mmol/L (ref 22–32)
Calcium: 9.2 mg/dL (ref 8.9–10.3)
Creatinine, Ser: 0.61 mg/dL (ref 0.44–1.00)
GFR calc non Af Amer: >60 mL/min (ref >60)
Glucose, Bld: 95 mg/dL (ref 65–99)
POTASSIUM: 3.7 mmol/L (ref 3.5–5.1)
SODIUM: 135 mmol/L (ref 135–145)

## 2014-11-12 LAB — APTT: aPTT: 41 seconds — ABNORMAL HIGH (ref 24–37)

## 2014-11-12 LAB — PROTIME-INR
INR: 2.53 — AB (ref 0.00–1.49)
PROTHROMBIN TIME: 26.9 s — AB (ref 11.6–15.2)

## 2014-11-12 NOTE — ED Provider Notes (Signed)
CSN: [id number]     Arrival date & time 11/12/14  1101 History   First MD Initiated Contact with Patient 11/12/14 1103     Chief Complaint  Patient presents with  . Chest Pain  . right arm pain       (Consider location/radiation/quality/duration/timing/severity/associated sxs/prior Treatment) HPI Comments: Patient is an 81 year old female past medical history h/o symptomatic bradycardia, status post permanent pacemaker insertion, hypertension, and chronic anticoagulation presented to the emergency department for right arm and facial numbness and tingling with right arm weakness that started when she awoke from sleep this morning. Patient states it persisted so she went to the fire department for evaluation was advised to come to the ER. Denies any symptoms currently. She does endorse that she has had some right-sided jaw pain intermittently. Denies any chest pain, shortness of breath. Last INR check was 1 week ago, within therapeutic range. Carotid studies done 1 year ago less than 40% stenosis. No history of CVA or MI.  Patient is a 81 y.o. female presenting with chest pain.  Chest Pain Associated symptoms: weakness (R arm)   Associated symptoms: no headache and no shortness of breath     Past Medical History  Diagnosis Date  . Pneumonia   . Osteoarthritis   . Hypertension   . Atrial fibrillation   . Bradycardia   . Complication of anesthesia   . PONV (postoperative nausea and vomiting)   . Dysrhythmia     A FIB  . Pacemaker   . Shortness of breath     WITH EXERTION  . History of skin cancer   . H/O hiatal hernia   . History of kidney stones   . History of transfusion   . Difficulty sleeping   . Anxiety    Past Surgical History  Procedure Laterality Date  . Microdiskectomy  05/2004    Left L4-L5  . Arthroplasty  1994    Left total knee  . Arthroplasty  2000    Total right knee  . Dg selected hsg gdc only  2002    Dilation  . Appendectomy  1960  . Total abdominal  hysterectomy  1969  . Pacemaker insertion    . Joint replacement      knee replacement  . Cholecystectomy    . Back surgery    . Total hip arthroplasty Right 12/24/2013    Procedure: RIGHT TOTAL HIP ARTHROPLASTY ANTERIOR APPROACH;  Surgeon: Gearlean Alf, MD;  Location: [facility];  Service: Orthopedics;  Laterality: Right;   Family History  Problem Relation Age of Onset  . Heart disease Mother   . Leukemia Father    Social History  Substance Use Topics  . Smoking status: Former Smoker    Quit date: 12/18/1973  . Smokeless tobacco: None     Comment: Smoker for 25 years and quit at around age 89  . Alcohol Use: No   OB History    No data available     Review of Systems  HENT:       +jaw pain  Respiratory: Negative for shortness of breath.   Cardiovascular: Negative for chest pain.  Neurological: Positive for weakness (R arm). Negative for syncope, speech difficulty, light-headedness and headaches.       + R sided facial and arm tingling  All other systems reviewed and are negative.     Allergies  Morphine and related  Home Medications   Prior to Admission medications   Medication Sig Start Date End  Date Taking? Authorizing Provider  acetaminophen (TYLENOL) 500 MG tablet Take 1,000 mg by mouth every 6 (six) hours as needed for mild pain.   Yes Historical Provider, MD  amLODipine (NORVASC) 10 MG tablet Take 10 mg by mouth every morning.    Yes Historical Provider, MD  cloNIDine (CATAPRES) 0.1 MG tablet Take 0.1 mg by mouth daily.    Yes Historical Provider, MD  hydrochlorothiazide 25 MG tablet Take 25 mg by mouth every morning.    Yes Historical Provider, MD  irbesartan (AVAPRO) 300 MG tablet Take 300 mg by mouth every morning.   Yes Historical Provider, MD  potassium chloride SA (K-DUR,KLOR-CON) 20 MEQ tablet Take 20 mEq by mouth every morning.    Yes Historical Provider, MD  pravastatin (PRAVACHOL) 40 MG tablet Take 40 mg by mouth every morning.    Yes Historical  Provider, MD  telmisartan (MICARDIS) 80 MG tablet Take 80 mg by mouth daily.   Yes Historical Provider, MD  warfarin (COUMADIN) 2 MG tablet Take 1-2 tablets (2-4 mg total) by mouth as directed. Take Coumadin for three weeks for postoperative protocol and then the patient may resume their previous Coumadin home regimen.  The dose may need to be adjusted based upon the INR.  Please follow the INR and titrate Coumadin dose for a therapeutic range between 2.0 and 3.0 INR.  After completing the three weeks of Coumadin, the patient may resume their previous Coumadin home regimen. Patient taking differently: Take 2-4 mg by mouth as directed. Pt takes 2mg  on Tues,Wed, Fri, Sat, Sun and takes 4mg  on Mon, Thurs 12/27/13  Yes [name], PA-C   BP 154/71 mmHg  Pulse 60  Temp(Src) 97.7 F (36.5 C)  Resp 14  Ht 5\' 4"  (1.626 m)  Wt 152 lb (68.947 kg)  BMI 26.08 kg/m2  SpO2 98% Physical Exam  Constitutional: She is oriented to person, place, and time. She appears well-developed and well-nourished. No distress.  HENT:  Head: Normocephalic and atraumatic.  Right Ear: External ear normal.  Left Ear: External ear normal.  Nose: Nose normal.  Mouth/Throat: Oropharynx is clear and moist. No oropharyngeal exudate.  No reproducible jaw tenderness  Eyes: Conjunctivae and EOM are normal. Pupils are equal, round, and reactive to light.  Neck: Normal range of motion. Neck supple.  Cardiovascular: Normal rate, regular rhythm, normal heart sounds and intact distal pulses.   Pulmonary/Chest: Effort normal and breath sounds normal. No respiratory distress.  Abdominal: Soft. There is no tenderness.  Lymphadenopathy:    She has no cervical adenopathy.  Neurological: She is alert and oriented to person, place, and time. She has normal strength. No cranial nerve deficit. Gait normal. GCS eye subscore is 4. GCS verbal subscore is 5. GCS motor subscore is 6.  Sensation grossly intact.  No pronator drift.  Bilateral  heel-knee-shin intact.  Skin: Skin is warm and dry. She is not diaphoretic.  Nursing note and vitals reviewed.   ED Course  Procedures (including critical care time) Medications - No data to display  Labs Review Labs Reviewed  BASIC METABOLIC PANEL - Abnormal; Notable for the following:    Chloride 97 (*)    All other components within normal limits  PROTIME-INR - Abnormal; Notable for the following:    Prothrombin Time 26.9 (*)    INR 2.53 (*)    All other components within normal limits  APTT - Abnormal; Notable for the following:    aPTT 41 (*)    All other  components within normal limits  URINALYSIS, ROUTINE W REFLEX MICROSCOPIC (NOT AT [facility]) - Abnormal; Notable for the following:    Leukocytes, UA TRACE (*)    All other components within normal limits  CBC  DIFFERENTIAL  HEPATIC FUNCTION PANEL  URINE MICROSCOPIC-ADD ON  URINALYSIS, ROUTINE W REFLEX MICROSCOPIC (NOT AT [facility])  Randolm Idol, ED    Imaging Review Dg Chest 2 View  11/12/2014   CLINICAL DATA:  Right side weakness, chest pain.  EXAM: CHEST  2 VIEW  FINDINGS: Linear scarring at the left base. Left pacer is unchanged. Heart is borderline in size. Right lung is clear. No effusions. No acute bony abnormality.  IMPRESSION: Left based scarring.  No active disease.   Electronically Signed   By: Rolm Baptise M.D.   On: 11/12/2014 12:26   Ct Head Wo Contrast  11/12/2014   CLINICAL DATA:  Right facial tingling and right arm tingling. Jaw pain.  EXAM: CT HEAD WITHOUT CONTRAST  TECHNIQUE: Contiguous axial images were obtained from the base of the skull through the vertex without intravenous contrast.  COMPARISON:  05/21/2013  FINDINGS: No acute intracranial abnormality. Specifically, no hemorrhage, hydrocephalus, mass lesion, acute infarction, or significant intracranial injury. No acute calvarial abnormality. Visualized paranasal sinuses and mastoids clear. Orbital soft tissues unremarkable.  IMPRESSION: No acute  intracranial abnormality.   Electronically Signed   By: Rolm Baptise M.D.   On: 11/12/2014 12:51   I have personally reviewed and evaluated these images and lab results as part of my medical decision-making.   EKG Interpretation   Date/Time:  Tuesday November 12 2014 11:07:21 EDT Ventricular Rate:  60 PR Interval:  175 QRS Duration: 153 QT Interval:  464 QTC Calculation: 464 R Axis:   -103 Text Interpretation:  Ventricular-paced rhythm No further analysis  attempted due to paced rhythm No significant change was found Confirmed by  CAMPOS  MD, KEVIN ([id number]) on 11/12/2014 11:50:17 AM      MDM   Final diagnoses:  Transient cerebral ischemia, unspecified transient cerebral ischemia type    Filed Vitals:   11/12/14 1556  BP: 154/71  Pulse: 60  Temp:   Resp: 14   Afebrile, NAD, non-toxic appearing, AAOx4.   Patient presenting with resolved right sided arm and facial numbness and tingling. Symptoms were present when she awoke this morning. No neurofocal deficits on examination. Remains symptom-free on emergency department. CT scan reviewed without acute abnormality. Unable to obtain MRI due to pacemaker placement. Atrophy. Patient is already anticoagulated on Coumadin, therapeutic range noted. Symptoms consistent with likely TIA. Patient would like to go home, has a son who is able to watch patient. Advised PCP f/u for follow up. Advised cardiology follow up. Return precautions discussed. Patient is agreeable to plan. Patient is stable at time of discharge. Patient d/w with Dr. Venora Maples, agrees with plan.     Baron Sane, PA-C 11/12/14 [place], MD 11/12/14 [phone]

## 2014-11-12 NOTE — ED Notes (Signed)
Pt to ED via [facility] -- was picked up at fire station with pt having complaint of right facial tingling, right arm tingling, jaw pain, pt went to fire department to get BP check.

## 2014-11-12 NOTE — ED Notes (Signed)
Ambulated to bathroom without assistance, no dizziness,

## 2014-11-14 DIAGNOSIS — I10 Essential (primary) hypertension: Secondary | ICD-10-CM | POA: Diagnosis not present

## 2014-11-14 DIAGNOSIS — Z6826 Body mass index (BMI) 26.0-26.9, adult: Secondary | ICD-10-CM | POA: Diagnosis not present

## 2014-11-14 DIAGNOSIS — E785 Hyperlipidemia, unspecified: Secondary | ICD-10-CM | POA: Diagnosis not present

## 2014-11-14 DIAGNOSIS — Z7901 Long term (current) use of anticoagulants: Secondary | ICD-10-CM | POA: Diagnosis not present

## 2014-11-14 DIAGNOSIS — I482 Chronic atrial fibrillation: Secondary | ICD-10-CM | POA: Diagnosis not present

## 2014-11-14 DIAGNOSIS — G459 Transient cerebral ischemic attack, unspecified: Secondary | ICD-10-CM | POA: Diagnosis not present

## 2014-11-14 DIAGNOSIS — M1990 Unspecified osteoarthritis, unspecified site: Secondary | ICD-10-CM | POA: Diagnosis not present

## 2014-11-19 DIAGNOSIS — Z471 Aftercare following joint replacement surgery: Secondary | ICD-10-CM | POA: Diagnosis not present

## 2014-11-19 DIAGNOSIS — Z96641 Presence of right artificial hip joint: Secondary | ICD-10-CM | POA: Diagnosis not present

## 2014-11-20 DIAGNOSIS — H8110 Benign paroxysmal vertigo, unspecified ear: Secondary | ICD-10-CM | POA: Diagnosis not present

## 2014-11-27 ENCOUNTER — Emergency Department (INDEPENDENT_AMBULATORY_CARE_PROVIDER_SITE_OTHER)
Admission: EM | Admit: 2014-11-27 | Discharge: 2014-11-27 | Disposition: A | Discharge status: Home / Self Care | Payer: Medicare Other | Source: Home / Self Care | Attending: Family Medicine | Admitting: Family Medicine

## 2014-11-27 ENCOUNTER — Emergency Department (INDEPENDENT_AMBULATORY_CARE_PROVIDER_SITE_OTHER): Payer: Medicare Other

## 2014-11-27 ENCOUNTER — Encounter (HOSPITAL_COMMUNITY): Payer: Self-pay | Admitting: Emergency Medicine

## 2014-11-27 DIAGNOSIS — M25571 Pain in right ankle and joints of right foot: Secondary | ICD-10-CM

## 2014-11-27 DIAGNOSIS — M7989 Other specified soft tissue disorders: Secondary | ICD-10-CM | POA: Diagnosis not present

## 2014-11-27 DIAGNOSIS — S99911A Unspecified injury of right ankle, initial encounter: Secondary | ICD-10-CM | POA: Diagnosis not present

## 2014-11-27 NOTE — Discharge Instructions (Signed)
There is no evidence of fracture in your ankle. You likely have some chronic ongoing degenerative changes causing your pain and possible pinching of the nerve that goes around the side of the ankle. Please wear the ASO brace as discussed. Please take your foot out when at rest and perform range of motion exercises multiple times per day. Please also consider purchasing a neoprene ankle and foot sleeve for additional compression and support. Please use 1 g of Tylenol every morning for pain relief. Please call your orthopedic surgeon to set a follow-up appointment for sometime in the next couple of weeks. You can cancel this appointment if her symptoms resolve.

## 2014-11-27 NOTE — ED Provider Notes (Signed)
CSN: [id number]     Arrival date & time 11/27/14  1839 History   First MD Initiated Contact with Patient 11/27/14 1941     Chief Complaint  Patient presents with  . Ankle Pain    right   (Consider location/radiation/quality/duration/timing/severity/associated sxs/prior Treatment) HPI  R ankle pain. Sudden onset. Comes and goes. First episode 14:00 1 day ago. Came on after taking several steps from car to house. Non-radiating. Resolved spontaneously after waiting 5 min. Pain returned when ambulating to bathroom overnight. No trauma. 3rd episode again today around 14:00 when getting out of the car. Denies fevers or swelling.     Past Medical History  Diagnosis Date  . Pneumonia   . Osteoarthritis   . Hypertension   . Atrial fibrillation   . Bradycardia   . Complication of anesthesia   . PONV (postoperative nausea and vomiting)   . Dysrhythmia     A FIB  . Pacemaker   . Shortness of breath     WITH EXERTION  . History of skin cancer   . H/O hiatal hernia   . History of kidney stones   . History of transfusion   . Difficulty sleeping   . Anxiety    Past Surgical History  Procedure Laterality Date  . Microdiskectomy  05/2004    Left L4-L5  . Arthroplasty  1994    Left total knee  . Arthroplasty  2000    Total right knee  . Dg selected hsg gdc only  2002    Dilation  . Appendectomy  1960  . Total abdominal hysterectomy  1969  . Pacemaker insertion    . Joint replacement      knee replacement  . Cholecystectomy    . Back surgery    . Total hip arthroplasty Right 12/24/2013    Procedure: RIGHT TOTAL HIP ARTHROPLASTY ANTERIOR APPROACH;  Surgeon: Gearlean Alf, MD;  Location: [facility];  Service: Orthopedics;  Laterality: Right;   Family History  Problem Relation Age of Onset  . Heart disease Mother   . Leukemia Father    Social History  Substance Use Topics  . Smoking status: Former Smoker    Quit date: 12/18/1973  . Smokeless tobacco: None     Comment: Smoker  for 25 years and quit at around age 29  . Alcohol Use: No   OB History    No data available     Review of Systems Per HPI with all other pertinent systems negative.   Allergies  Morphine and related  Home Medications   Prior to Admission medications   Medication Sig Start Date End Date Taking? Authorizing Provider  acetaminophen (TYLENOL) 500 MG tablet Take 1,000 mg by mouth every 6 (six) hours as needed for mild pain.    Historical Provider, MD  amLODipine (NORVASC) 10 MG tablet Take 10 mg by mouth every morning.     Historical Provider, MD  cloNIDine (CATAPRES) 0.1 MG tablet Take 0.1 mg by mouth daily.     Historical Provider, MD  hydrochlorothiazide 25 MG tablet Take 25 mg by mouth every morning.     Historical Provider, MD  irbesartan (AVAPRO) 300 MG tablet Take 300 mg by mouth every morning.    Historical Provider, MD  potassium chloride SA (K-DUR,KLOR-CON) 20 MEQ tablet Take 20 mEq by mouth every morning.     Historical Provider, MD  pravastatin (PRAVACHOL) 40 MG tablet Take 40 mg by mouth every morning.     Historical Provider,  MD  telmisartan (MICARDIS) 80 MG tablet Take 80 mg by mouth daily.    Historical Provider, MD  warfarin (COUMADIN) 2 MG tablet Take 1-2 tablets (2-4 mg total) by mouth as directed. Take Coumadin for three weeks for postoperative protocol and then the patient may resume their previous Coumadin home regimen.  The dose may need to be adjusted based upon the INR.  Please follow the INR and titrate Coumadin dose for a therapeutic range between 2.0 and 3.0 INR.  After completing the three weeks of Coumadin, the patient may resume their previous Coumadin home regimen. Patient taking differently: Take 2-4 mg by mouth as directed. Pt takes 2mg  on Tues,Wed, Fri, Sat, Sun and takes 4mg  on Mon, Thurs 12/27/13   [name], PA-C   Meds Ordered and Administered this Visit  Medications - No data to display  BP 182/81 mmHg  Pulse 63  Temp(Src) 97.4 F (36.3 C) (Oral)   Resp 16  SpO2 95% No data found.   Physical Exam Physical Exam  Constitutional: oriented to person, place, and time. appears well-developed and well-nourished. No distress.  HENT:  Head: Normocephalic and atraumatic.  Eyes: EOMI. PERRL.  Neck: Normal range of motion.  Cardiovascular: RRR, no m/r/g, 2+ distal pulses,  Pulmonary/Chest: Effort normal and breath sounds normal. No respiratory distress.  Abdominal: Soft. Bowel sounds are normal. NonTTP, no distension.  Musculoskeletal: Bilateral 1+ lower extremity edema. At [employer]. Patient with pain on ambulation. Point tender at the insertion of the ATFL. No appreciable swelling and erythema outside of baseline edema..  Neurological: alert and oriented to person, place, and time.  Skin: Skin is warm. No rash noted. non diaphoretic.  Psychiatric: normal mood and affect. behavior is normal. Judgment and thought content normal.   ED Course  Procedures (including critical care time)  Labs Review Labs Reviewed - No data to display  Imaging Review Dg Ankle Complete Right  11/27/2014   CLINICAL DATA:  Right ankle pain after stepping out of car yesterday.  EXAM: RIGHT ANKLE - COMPLETE 3+ VIEW  COMPARISON:  None.  FINDINGS: There is diffuse soft tissue swelling over the ankle. Ankle mortise is within normal. There are mild degenerative changes over the midfoot. There is no acute fracture or dislocation. Small vessel atherosclerotic disease is present.  IMPRESSION: No acute fracture or dislocation.   Electronically Signed   By: Marin Olp M.D.   On: 11/27/2014 20:14     Visual Acuity Review  Right Eye Distance:   Left Eye Distance:   Bilateral Distance:    Right Eye Near:   Left Eye Near:    Bilateral Near:         MDM   1. Ankle pain, right    Plain film reassuring that there is no fracture. Patient likely suffering from some mild foot breakdown and possible stretching or injury to the right ATFL and pinching of the  peroneal nerve. Patient placed in an ASO brace and told to purchase a neoprene ankle and midfoot support brace. Reminded patient of the importance of daily ankle physical therapy and use of Tylenol for pain. Patient to use her custom orthotics in all shoes that she wears not just intermittently. Patient to call her orthopedic surgeon to schedule a follow-up appointment and can cancel this appointment if her symptoms resolve. Patient encounter attended to by patient's son and granddaughter.    Waldemar Dickens, MD 11/27/14 2100

## 2014-11-28 DIAGNOSIS — I495 Sick sinus syndrome: Secondary | ICD-10-CM | POA: Diagnosis not present

## 2014-11-28 DIAGNOSIS — R001 Bradycardia, unspecified: Secondary | ICD-10-CM | POA: Diagnosis not present

## 2014-12-05 DIAGNOSIS — Z7901 Long term (current) use of anticoagulants: Secondary | ICD-10-CM | POA: Diagnosis not present

## 2014-12-05 DIAGNOSIS — I482 Chronic atrial fibrillation: Secondary | ICD-10-CM | POA: Diagnosis not present

## 2014-12-09 DIAGNOSIS — R109 Unspecified abdominal pain: Secondary | ICD-10-CM | POA: Diagnosis not present

## 2014-12-09 DIAGNOSIS — Z6826 Body mass index (BMI) 26.0-26.9, adult: Secondary | ICD-10-CM | POA: Diagnosis not present

## 2014-12-12 DIAGNOSIS — Z7901 Long term (current) use of anticoagulants: Secondary | ICD-10-CM | POA: Diagnosis not present

## 2014-12-12 DIAGNOSIS — I482 Chronic atrial fibrillation: Secondary | ICD-10-CM | POA: Diagnosis not present

## 2014-12-12 DIAGNOSIS — G459 Transient cerebral ischemic attack, unspecified: Secondary | ICD-10-CM | POA: Diagnosis not present

## 2014-12-21 DIAGNOSIS — Z23 Encounter for immunization: Secondary | ICD-10-CM | POA: Diagnosis not present

## 2014-12-27 DIAGNOSIS — M25571 Pain in right ankle and joints of right foot: Secondary | ICD-10-CM | POA: Diagnosis not present

## 2014-12-27 DIAGNOSIS — R2681 Unsteadiness on feet: Secondary | ICD-10-CM | POA: Diagnosis not present

## 2015-01-06 ENCOUNTER — Emergency Department (HOSPITAL_COMMUNITY)
Admission: EM | Admit: 2015-01-06 | Discharge: 2015-01-06 | Disposition: A | Discharge status: Home / Self Care | Payer: Medicare Other | Attending: Emergency Medicine | Admitting: Emergency Medicine

## 2015-01-06 ENCOUNTER — Emergency Department (HOSPITAL_COMMUNITY): Payer: Medicare Other

## 2015-01-06 ENCOUNTER — Encounter (HOSPITAL_COMMUNITY): Payer: Self-pay | Admitting: Emergency Medicine

## 2015-01-06 DIAGNOSIS — Z950 Presence of cardiac pacemaker: Secondary | ICD-10-CM | POA: Diagnosis not present

## 2015-01-06 DIAGNOSIS — Z8701 Personal history of pneumonia (recurrent): Secondary | ICD-10-CM | POA: Diagnosis not present

## 2015-01-06 DIAGNOSIS — F419 Anxiety disorder, unspecified: Secondary | ICD-10-CM | POA: Insufficient documentation

## 2015-01-06 DIAGNOSIS — Z87891 Personal history of nicotine dependence: Secondary | ICD-10-CM | POA: Insufficient documentation

## 2015-01-06 DIAGNOSIS — I10 Essential (primary) hypertension: Secondary | ICD-10-CM | POA: Insufficient documentation

## 2015-01-06 DIAGNOSIS — Z87442 Personal history of urinary calculi: Secondary | ICD-10-CM | POA: Insufficient documentation

## 2015-01-06 DIAGNOSIS — M5412 Radiculopathy, cervical region: Secondary | ICD-10-CM | POA: Diagnosis not present

## 2015-01-06 DIAGNOSIS — R9431 Abnormal electrocardiogram [ECG] [EKG]: Secondary | ICD-10-CM | POA: Diagnosis not present

## 2015-01-06 DIAGNOSIS — I4891 Unspecified atrial fibrillation: Secondary | ICD-10-CM | POA: Insufficient documentation

## 2015-01-06 DIAGNOSIS — Z7901 Long term (current) use of anticoagulants: Secondary | ICD-10-CM | POA: Diagnosis not present

## 2015-01-06 DIAGNOSIS — Z85828 Personal history of other malignant neoplasm of skin: Secondary | ICD-10-CM | POA: Insufficient documentation

## 2015-01-06 DIAGNOSIS — M549 Dorsalgia, unspecified: Secondary | ICD-10-CM | POA: Diagnosis not present

## 2015-01-06 DIAGNOSIS — M542 Cervicalgia: Secondary | ICD-10-CM | POA: Diagnosis present

## 2015-01-06 LAB — I-STAT CHEM 8, ED
BUN: 6 mg/dL (ref 6–20)
CALCIUM ION: 1.11 mmol/L — AB (ref 1.13–1.30)
CHLORIDE: 98 mmol/L — AB (ref 101–111)
CREATININE: 0.6 mg/dL (ref 0.44–1.00)
GLUCOSE: 101 mg/dL — AB (ref 65–99)
HCT: 45 % (ref 36.0–46.0)
Hemoglobin: 15.3 g/dL — ABNORMAL HIGH (ref 12.0–15.0)
Potassium: 4 mmol/L (ref 3.5–5.1)
Sodium: 135 mmol/L (ref 135–145)
TCO2: 27 mmol/L (ref 0–100)

## 2015-01-06 LAB — PROTIME-INR
INR: 2.62 — ABNORMAL HIGH (ref 0.00–1.49)
Prothrombin Time: 27.7 seconds — ABNORMAL HIGH (ref 11.6–15.2)

## 2015-01-06 LAB — I-STAT TROPONIN, ED: Troponin i, poc: 0 ng/mL (ref 0.00–0.08)

## 2015-01-06 MED ORDER — HYDROMORPHONE HCL 1 MG/ML IJ SOLN
0.5000 mg | Freq: Once | INTRAMUSCULAR | Status: AC
  Administered 2015-01-06: 0.5 mg via INTRAVENOUS

## 2015-01-06 MED ORDER — ONDANSETRON HCL 4 MG/2ML IJ SOLN: 4.0000 mg | Freq: Once | INTRAMUSCULAR | Status: DC

## 2015-01-06 MED ORDER — TRAMADOL HCL 50 MG PO TABS
50.0000 mg | ORAL_TABLET | Freq: Two times a day (BID) | ORAL | Status: DC | PRN
Start: 2015-01-06 — End: 2015-05-20

## 2015-01-06 MED ORDER — METOCLOPRAMIDE HCL 10 MG PO TABS: 10.0000 mg | ORAL_TABLET | Freq: Three times a day (TID) | ORAL | Status: DC | PRN

## 2015-01-06 MED ORDER — TRAMADOL HCL 50 MG PO TABS
50.0000 mg | ORAL_TABLET | Freq: Once | ORAL | Status: AC
  Administered 2015-01-06: 50 mg via ORAL
  Filled 2015-01-06: qty 1

## 2015-01-06 MED ORDER — ONDANSETRON HCL 4 MG/2ML IJ SOLN
4.0000 mg | Freq: Once | INTRAMUSCULAR | Status: AC
  Administered 2015-01-06: 4 mg via INTRAVENOUS
  Filled 2015-01-06: qty 2

## 2015-01-06 NOTE — ED Provider Notes (Signed)
CSN: [id number]     Arrival date & time 01/06/15  4098 History   First MD Initiated Contact with Patient 01/06/15 1154     No chief complaint on file.    (Consider location/radiation/quality/duration/timing/severity/associated sxs/prior Treatment) HPI Implants of posterior neck pain rating to her shoulder and upper arm onset 2 days ago. Pain is worse with rotating her neck improved with remaining still. She is treated self with Tylenol, without relief. She's had similar pain in the past which she states is due to a "pinched nerve in my neck." She has received several injections in her neck in the past with transient relief. Last had similar pain prostate one year ago. No other associated symptoms. No chest pain or shortness breath no fever no trauma Past Medical History  Diagnosis Date  . Pneumonia   . Osteoarthritis   . Hypertension   . Atrial fibrillation ([place])   . Bradycardia   . Complication of anesthesia   . PONV (postoperative nausea and vomiting)   . Dysrhythmia     A FIB  . Pacemaker   . Shortness of breath     WITH EXERTION  . History of skin cancer   . H/O hiatal hernia   . History of kidney stones   . History of transfusion   . Difficulty sleeping   . Anxiety    Past Surgical History  Procedure Laterality Date  . Microdiskectomy  05/2004    Left L4-L5  . Arthroplasty  1994    Left total knee  . Arthroplasty  2000    Total right knee  . Dg selected hsg gdc only  2002    Dilation  . Appendectomy  1960  . Total abdominal hysterectomy  1969  . Pacemaker insertion    . Joint replacement      knee replacement  . Cholecystectomy    . Back surgery    . Total hip arthroplasty Right 12/24/2013    Procedure: RIGHT TOTAL HIP ARTHROPLASTY ANTERIOR APPROACH;  Surgeon: Gearlean Alf, MD;  Location: [facility];  Service: Orthopedics;  Laterality: Right;   Family History  Problem Relation Age of Onset  . Heart disease Mother   . Leukemia Father    Social History   Substance Use Topics  . Smoking status: Former Smoker    Quit date: 12/18/1973  . Smokeless tobacco: None     Comment: Smoker for 25 years and quit at around age 85  . Alcohol Use: No   OB History    No data available     Review of Systems  Constitutional: Negative.   HENT: Negative.   Respiratory: Negative.   Cardiovascular: Negative.   Gastrointestinal: Negative.   Musculoskeletal: Positive for neck pain.  Skin: Negative.   Neurological: Negative.   Psychiatric/Behavioral: Negative.   All other systems reviewed and are negative.     Allergies  Morphine and related  Home Medications   Prior to Admission medications   Medication Sig Start Date End Date Taking? Authorizing Provider  acetaminophen (TYLENOL) 500 MG tablet Take 1,000 mg by mouth every 6 (six) hours as needed for mild pain.   Yes Historical Provider, MD  amLODipine (NORVASC) 10 MG tablet Take 10 mg by mouth every morning.    Yes Historical Provider, MD  cloNIDine (CATAPRES) 0.1 MG tablet Take 0.05-0.1 mg by mouth 2 (two) times daily. Takes one tablet (0.1 mg) in the morning and half a tablet (0.05 mg) at night.   Yes Historical Provider,  MD  hydrochlorothiazide 25 MG tablet Take 25 mg by mouth every morning.    Yes Historical Provider, MD  irbesartan (AVAPRO) 300 MG tablet Take 300 mg by mouth every morning.   Yes Historical Provider, MD  potassium chloride SA (K-DUR,KLOR-CON) 20 MEQ tablet Take 20 mEq by mouth every morning.    Yes Historical Provider, MD  pravastatin (PRAVACHOL) 40 MG tablet Take 40 mg by mouth every morning.    Yes Historical Provider, MD  warfarin (COUMADIN) 2 MG tablet Take 1-2 tablets (2-4 mg total) by mouth as directed. Take Coumadin for three weeks for postoperative protocol and then the patient may resume their previous Coumadin home regimen.  The dose may need to be adjusted based upon the INR.  Please follow the INR and titrate Coumadin dose for a therapeutic range between 2.0 and 3.0  INR.  After completing the three weeks of Coumadin, the patient may resume their previous Coumadin home regimen. Patient taking differently: Take 2-4 mg by mouth as directed. Pt takes 2mg  on Tues,Wed, Fri, Sat, Sun and takes 4mg  on Mon, Thurs 12/27/13  Yes Drew Dara Lords, PA-C  FLUZONE HIGH-DOSE 0.5 ML SUSY TO BE ADMINISTERED BY PHARMACIST 12/21/14   Historical Provider, MD   BP 176/59 mmHg  Pulse 60  Temp(Src) 97.2 F (36.2 C) (Oral)  Resp 16  SpO2 100% Physical Exam  Constitutional: She is oriented to person, place, and time. She appears well-developed and well-nourished. She appears distressed.  Appears uncomfortable Glasgow comma score 15  HENT:  Head: Normocephalic and atraumatic.  Eyes: Conjunctivae are normal. Pupils are equal, round, and reactive to light.  Neck: Neck supple. No tracheal deviation present. No thyromegaly present.  No point tenderness she has pain at posterior neck upon rotation to the left. No bruit  Cardiovascular: Normal rate and regular rhythm.   No murmur heard. Pulmonary/Chest: Effort normal and breath sounds normal.  Abdominal: Soft. Bowel sounds are normal. She exhibits no distension. There is no tenderness.  Musculoskeletal: Normal range of motion. She exhibits no edema or tenderness.  Neurological: She is alert and oriented to person, place, and time. No cranial nerve deficit. Coordination normal.  Motor strength 5 over 5 overall  Skin: Skin is warm and dry. No rash noted.  Psychiatric: She has a normal mood and affect.  Nursing note and vitals reviewed.   ED Course  Procedures (including critical care time) Labs Review Labs Reviewed - No data to display  Imaging Review No results found. I have personally reviewed and evaluated these images and lab results as part of my medical decision-making.   EKG Interpretation   Date/Time:  Monday January 06 2015 14:07:08 EDT Ventricular Rate:  63 PR Interval:    QRS Duration: 88 QT Interval:  449 QTC  Calculation: 460 R Axis:   78 Text Interpretation:  VENTRICULAR PACING No further rhythm analysis  attempted due to paced rhythm Low voltage, precordial leads Borderline  repolarization abnormality No significant change since last tracing  Confirmed by Winfred Leeds  MD, Evette Diclemente [phone]) on 01/06/2015 2:22:00 PM     Pain was temporarily controlled with intravenous hydromorphone however patient became lightheaded and nauseated.  She was given a soft cervical collar. Results for orders placed or performed during the hospital encounter of 01/06/15  Protime-INR  Result Value Ref Range   Prothrombin Time 27.7 (H) 11.6 - 15.2 seconds   INR 2.62 (H) 0.00 - 1.49  I-stat chem 8, ed  Result Value Ref Range   Sodium  135 135 - 145 mmol/L   Potassium 4.0 3.5 - 5.1 mmol/L   Chloride 98 (L) 101 - 111 mmol/L   BUN 6 6 - 20 mg/dL   Creatinine, Ser 0.60 0.44 - 1.00 mg/dL   Glucose, Bld 101 (H) 65 - 99 mg/dL   Calcium, Ion 1.11 (L) 1.13 - 1.30 mmol/L   TCO2 27 0 - 100 mmol/L   Hemoglobin 15.3 (H) 12.0 - 15.0 g/dL   HCT 45.0 36.0 - 46.0 %  I-stat troponin, ED  Result Value Ref Range   Troponin i, poc 0.00 0.00 - 0.08 ng/mL   Comment 3           Ct Cervical Spine Wo Contrast  01/06/2015   CLINICAL DATA:  Left neck pain for 2 days.  No reported injury.  EXAM: CT CERVICAL SPINE WITHOUT CONTRAST  TECHNIQUE: Multidetector CT imaging of the cervical spine was performed without intravenous contrast. Multiplanar CT image reconstructions were also generated.  COMPARISON:  None.  FINDINGS: No fracture is detected in the cervical spine. No prevertebral soft tissue swelling. There is straightening of the cervical spine, usually due to positioning and/or muscle spasm. Dens is well positioned between the lateral masses of C1, noting prominent calcification of the annular ligament and degenerative disc disease at the atlantodental joint. The lateral masses appear well-aligned. There is severe multilevel degenerative disc  disease throughout the cervical spine, most prominent C5-6 and C6-7. There is mild anterior canal stenosis at C5-6 and C6-7 due to disc osteophyte complexes. There is moderate facet arthropathy bilaterally, asymmetrically prominent on the left. There is moderate foraminal stenosis on the left at C2-3. There is severe foraminal stenosis on the left at C3-4. There is mild bilateral foraminal stenosis at C4-5 and moderate bilateral foraminal stenosis at C5-6.  Visualized mastoid air cells appear clear. No evidence of intra-axial hemorrhage in the visualized brain. No gross cervical canal hematoma. Visualized lung apices are clear. There is diffuse enlargement of the thyroid lobes, left greater than right, with heterogeneous thyroid parenchymal density containing scattered calcifications, most suggestive of a multinodular goiter, with the suggestion of a dominant 3.6 cm left thyroid lobe nodule. No appreciable cervical adenopathy or other cervical spine soft tissue abnormality.  IMPRESSION: 1. No cervical spine fracture. 2. Straightening of the cervical spine, usually positional and/or due to muscle spasm. 3. Severe multilevel degenerative disc disease in the cervical spine, most prominent at C5-6 and C6-7 with associated mild degenerative anterior canal stenosis at these levels. 4. Moderate facet arthropathy bilaterally, asymmetrically prominent on the left. Multilevel degenerative cervical foraminal stenosis as described. 5. Multinodular goiter, with a dominant 3.6 cm left thyroid lobe nodule. Correlate with thyroid ultrasound as clinically warranted.   Electronically Signed   By: Ilona Sorrel M.D.   On: 01/06/2015 13:48  450 pm pt is alert and ambualtory  MDM  Strongly doubt anginal equivalent with symptoms for several days. Pain worse with moving her neck, negative troponin. Pain is musculoskeletal in etiology. She'll be prescribed tramadol and Reglan.  She is advised to get her INR rechecked in one week as  tramadol can mildly elevate INR.  Suggest follow-up with Dr. Nelva Bush  diagnosis  cervical radiculopathy  Final diagnoses:  None        Orlie Dakin, MD 01/06/15 1659

## 2015-01-06 NOTE — ED Notes (Signed)
Patient was alert, oriented and stable upon discharge. RN went over AVS and patient had no further questions.  

## 2015-01-06 NOTE — ED Notes (Signed)
Cervical Collar applied, ambulated pt to the restroom with minimal assistance. However pt complains of 10/10 neck pain. RN aware

## 2015-01-06 NOTE — ED Notes (Signed)
Per EMS pt c/o neck pain onset Saturday, denies trauma, endorses hx of ruptured disk or pinched nerve one year ago, got steroid shots for pain in past.

## 2015-01-06 NOTE — Discharge Instructions (Signed)
Cervical Radiculopathy Call Dr.Ramos to schedule the next available appointment. Take Tylenol for mild pain or the pain medicine prescribed for bad pain. Wear the soft cervical collar as needed for comfort. You should get your Coumadin level(INR) rechecked in a week as the medication prescribed for pain can mildly increase your level. Cervical radiculopathy happens when a nerve in the neck (cervical nerve) is pinched or bruised. This condition can develop because of an injury or as part of the normal aging process. Pressure on the cervical nerves can cause pain or numbness that runs from the neck all the way down into the arm and fingers. Usually, this condition gets better with rest. Treatment may be needed if the condition does not improve.  CAUSES This condition may be caused by:  Injury.  Slipped (herniated) disk.  Muscle tightness in the neck because of overuse.  Arthritis.  Breakdown or degeneration in the bones and joints of the spine (spondylosis) due to aging.  Bone spurs that may develop near the cervical nerves. SYMPTOMS Symptoms of this condition include:  Pain that runs from the neck to the arm and hand. The pain can be severe or irritating. It may be worse when the neck is moved.  Numbness or weakness in the affected arm and hand. DIAGNOSIS This condition may be diagnosed based on symptoms, medical history, and a physical exam. You may also have tests, including:  X-rays.  CT scan.  MRI.  Electromyogram (EMG).  Nerve conduction tests. TREATMENT In many cases, treatment is not needed for this condition. With rest, the condition usually gets better over time. If treatment is needed, options may include:  Wearing a soft neck collar for short periods of time.  Physical therapy to strengthen your neck muscles.  Medicines, such as NSAIDs, oral corticosteroids, or spinal injections.  Surgery. This may be needed if other treatments do not help. Various types of  surgery may be done depending on the cause of your problems. HOME CARE INSTRUCTIONS Managing Pain  Take over-the-counter and prescription medicines only as told by your health care provider.  If directed, apply ice to the affected area.  Put ice in a plastic bag.  Place a towel between your skin and the bag.  Leave the ice on for 20 minutes, 2-3 times per day.  If ice does not help, you can try using heat. Take a warm shower or warm bath, or use a heat pack as told by your health care provider.  Try a gentle neck and shoulder massage to help relieve symptoms. Activity  Rest as needed. Follow instructions from your health care provider about any restrictions on activities.  Do stretching and strengthening exercises as told by your health care provider or physical therapist. General Instructions  If you were given a soft collar, wear it as told by your health care provider.  Use a flat pillow when you sleep.  Keep all follow-up visits as told by your health care provider. This is important. SEEK MEDICAL CARE IF:  Your condition does not improve with treatment. SEEK IMMEDIATE MEDICAL CARE IF:  Your pain gets much worse and cannot be controlled with medicines.  You have weakness or numbness in your hand, arm, face, or leg.  You have a high fever.  You have a stiff, rigid neck.  You lose control of your bowels or your bladder (have incontinence).  You have trouble with walking, balance, or speaking.   This information is not intended to replace advice given to  you by your health care provider. Make sure you discuss any questions you have with your health care provider.   Document Released: 12/08/2000 Document Revised: 12/04/2014 Document Reviewed: 05/09/2014 Elsevier Interactive Patient Education [employer].

## 2015-01-06 NOTE — ED Notes (Signed)
Bed: WA21 Expected date:  Expected time:  Means of arrival:  Comments: EMS- 81yo F, neck pain x 2 days/no injury

## 2015-01-07 ENCOUNTER — Telehealth: Payer: Self-pay | Admitting: Internal Medicine

## 2015-01-07 DIAGNOSIS — M542 Cervicalgia: Secondary | ICD-10-CM | POA: Diagnosis not present

## 2015-01-07 NOTE — Telephone Encounter (Signed)
ERROR

## 2015-01-14 DIAGNOSIS — I482 Chronic atrial fibrillation: Secondary | ICD-10-CM | POA: Diagnosis not present

## 2015-01-14 DIAGNOSIS — Z7901 Long term (current) use of anticoagulants: Secondary | ICD-10-CM | POA: Diagnosis not present

## 2015-01-14 DIAGNOSIS — G459 Transient cerebral ischemic attack, unspecified: Secondary | ICD-10-CM | POA: Diagnosis not present

## 2015-01-22 ENCOUNTER — Telehealth: Payer: Self-pay | Admitting: Internal Medicine

## 2015-01-22 NOTE — Telephone Encounter (Signed)
Discussed with Dr. Lovena Le.  Given recent TIA with therapeutic INR, he would like patient to be bridged.  Will fax to [facility] imaging.  Unsure who manages patient.  Spoke with pt.  She goes to Dr. Loren Racer office.  LMOM with Larene Beach, nurse for Dr. Osborne Casco regarding this.

## 2015-01-22 NOTE — Telephone Encounter (Signed)
New message      Request for surgical clearance:  What type of surgery is being performed?  Neck injection 1. When is this surgery scheduled? 01-28-15  2. Are there any medications that need to be held prior to surgery and how long? Stop warfarin tomorrow  Name of physician performing surgery? Dr Nelva Bush 3. What is your office phone and fax number?  Fax [phone] ----clearance form was faxed on 10-12

## 2015-01-22 NOTE — Telephone Encounter (Signed)
Pt was cleared by Dr. Lovena Le to have a hip injection in June but since that time, she went to the ER with a TIA in August.  INR was therapeutic.  With her advanced age, per protocol, will need his guidance on whether he would like [place] to bridge the patient or not.

## 2015-01-28 ENCOUNTER — Ambulatory Visit (INDEPENDENT_AMBULATORY_CARE_PROVIDER_SITE_OTHER): Payer: Medicare Other | Admitting: Pharmacist Clinician (PhC)/ Clinical Pharmacy Specialist

## 2015-01-28 DIAGNOSIS — I4891 Unspecified atrial fibrillation: Secondary | ICD-10-CM

## 2015-01-28 DIAGNOSIS — M542 Cervicalgia: Secondary | ICD-10-CM | POA: Diagnosis not present

## 2015-01-28 LAB — POCT INR: INR: 1.1

## 2015-01-31 DIAGNOSIS — I482 Chronic atrial fibrillation: Secondary | ICD-10-CM | POA: Diagnosis not present

## 2015-01-31 DIAGNOSIS — Z7901 Long term (current) use of anticoagulants: Secondary | ICD-10-CM | POA: Diagnosis not present

## 2015-02-03 DIAGNOSIS — Z7901 Long term (current) use of anticoagulants: Secondary | ICD-10-CM | POA: Diagnosis not present

## 2015-02-03 DIAGNOSIS — I482 Chronic atrial fibrillation: Secondary | ICD-10-CM | POA: Diagnosis not present

## 2015-02-06 DIAGNOSIS — M5090 Cervical disc disorder, unspecified, unspecified cervical region: Secondary | ICD-10-CM | POA: Diagnosis not present

## 2015-02-18 ENCOUNTER — Other Ambulatory Visit (HOSPITAL_COMMUNITY): Payer: Self-pay | Admitting: Internal Medicine

## 2015-02-18 DIAGNOSIS — Z6826 Body mass index (BMI) 26.0-26.9, adult: Secondary | ICD-10-CM | POA: Diagnosis not present

## 2015-02-18 DIAGNOSIS — E042 Nontoxic multinodular goiter: Secondary | ICD-10-CM | POA: Diagnosis not present

## 2015-02-25 ENCOUNTER — Ambulatory Visit (HOSPITAL_COMMUNITY)
Admission: RE | Admit: 2015-02-25 | Discharge: 2015-02-25 | Disposition: A | Discharge status: Home / Self Care | Payer: Medicare Other | Source: Ambulatory Visit | Attending: Internal Medicine | Admitting: Internal Medicine

## 2015-02-25 DIAGNOSIS — R590 Localized enlarged lymph nodes: Secondary | ICD-10-CM | POA: Diagnosis not present

## 2015-02-25 DIAGNOSIS — E042 Nontoxic multinodular goiter: Secondary | ICD-10-CM | POA: Insufficient documentation

## 2015-02-27 ENCOUNTER — Other Ambulatory Visit (HOSPITAL_COMMUNITY): Payer: Self-pay | Admitting: Internal Medicine

## 2015-02-27 DIAGNOSIS — R001 Bradycardia, unspecified: Secondary | ICD-10-CM | POA: Diagnosis not present

## 2015-02-27 DIAGNOSIS — I495 Sick sinus syndrome: Secondary | ICD-10-CM | POA: Diagnosis not present

## 2015-02-27 DIAGNOSIS — E042 Nontoxic multinodular goiter: Secondary | ICD-10-CM

## 2015-03-03 DIAGNOSIS — I482 Chronic atrial fibrillation: Secondary | ICD-10-CM | POA: Diagnosis not present

## 2015-03-03 DIAGNOSIS — Z7901 Long term (current) use of anticoagulants: Secondary | ICD-10-CM | POA: Diagnosis not present

## 2015-03-03 DIAGNOSIS — E042 Nontoxic multinodular goiter: Secondary | ICD-10-CM | POA: Diagnosis not present

## 2015-03-07 ENCOUNTER — Ambulatory Visit (HOSPITAL_COMMUNITY)
Admission: RE | Admit: 2015-03-07 | Discharge: 2015-03-07 | Disposition: A | Discharge status: Home / Self Care | Payer: Medicare Other | Source: Ambulatory Visit | Attending: Internal Medicine | Admitting: Internal Medicine

## 2015-03-07 DIAGNOSIS — E042 Nontoxic multinodular goiter: Secondary | ICD-10-CM

## 2015-03-07 DIAGNOSIS — E041 Nontoxic single thyroid nodule: Secondary | ICD-10-CM | POA: Diagnosis not present

## 2015-03-07 NOTE — Procedures (Signed)
Successful US guided fine needle aspiration of left lower pole thyroid nodule x 4 passes. No immediate complications, dressing placed.  Tsosie Billing PA-C Interventional Radiology  03/07/15  1:33 PM

## 2015-03-11 DIAGNOSIS — I482 Chronic atrial fibrillation: Secondary | ICD-10-CM | POA: Diagnosis not present

## 2015-03-11 DIAGNOSIS — Z7901 Long term (current) use of anticoagulants: Secondary | ICD-10-CM | POA: Diagnosis not present

## 2015-03-14 DIAGNOSIS — Z7901 Long term (current) use of anticoagulants: Secondary | ICD-10-CM | POA: Diagnosis not present

## 2015-03-14 DIAGNOSIS — I482 Chronic atrial fibrillation: Secondary | ICD-10-CM | POA: Diagnosis not present

## 2015-03-18 DIAGNOSIS — I482 Chronic atrial fibrillation: Secondary | ICD-10-CM | POA: Diagnosis not present

## 2015-03-18 DIAGNOSIS — Z7901 Long term (current) use of anticoagulants: Secondary | ICD-10-CM | POA: Diagnosis not present

## 2015-03-19 ENCOUNTER — Ambulatory Visit (INDEPENDENT_AMBULATORY_CARE_PROVIDER_SITE_OTHER): Payer: Medicare Other | Admitting: *Deleted

## 2015-03-19 ENCOUNTER — Encounter: Payer: Self-pay | Admitting: Internal Medicine

## 2015-03-19 DIAGNOSIS — Z950 Presence of cardiac pacemaker: Secondary | ICD-10-CM | POA: Diagnosis not present

## 2015-03-19 DIAGNOSIS — I4891 Unspecified atrial fibrillation: Secondary | ICD-10-CM

## 2015-03-19 LAB — CUP PACEART INCLINIC DEVICE CHECK
Battery Impedance: 1500 Ohm
Battery Voltage: 2.79 V
Brady Statistic RV Percent Paced: 74 %
Date Time Interrogation Session: 20161221101406
Implantable Lead Location: 753860
Lead Channel Setting Sensing Sensitivity: 2 mV
MDC IDC LEAD IMPLANT DT: 20070501
MDC IDC MSMT LEADCHNL RV IMPEDANCE VALUE: 358 Ohm
MDC IDC MSMT LEADCHNL RV PACING THRESHOLD AMPLITUDE: 0.875 V
MDC IDC MSMT LEADCHNL RV PACING THRESHOLD PULSEWIDTH: 0.4 ms
MDC IDC MSMT LEADCHNL RV SENSING INTR AMPL: 16.62 mV
MDC IDC PG SERIAL: 1664242
MDC IDC SET LEADCHNL RV PACING PULSEWIDTH: 0.4 ms

## 2015-03-19 NOTE — Progress Notes (Signed)
Pacemaker check in clinic. Normal device function. Thresholds, sensing, impedances consistent with previous measurements. Device programmed to maximize longevity. No episode triggers enabled. Device programmed at appropriate safety margins. Histogram distribution appropriate for patient activity level. Device programmed to optimize intrinsic conduction. Estimated longevity 6.0-7.75 years. Patient education completed. ROV with GT in 08/2015.

## 2015-04-17 DIAGNOSIS — I482 Chronic atrial fibrillation: Secondary | ICD-10-CM | POA: Diagnosis not present

## 2015-04-17 DIAGNOSIS — E871 Hypo-osmolality and hyponatremia: Secondary | ICD-10-CM | POA: Diagnosis not present

## 2015-04-17 DIAGNOSIS — R5383 Other fatigue: Secondary | ICD-10-CM | POA: Diagnosis not present

## 2015-04-17 DIAGNOSIS — E441 Mild protein-calorie malnutrition: Secondary | ICD-10-CM | POA: Diagnosis not present

## 2015-04-17 DIAGNOSIS — I10 Essential (primary) hypertension: Secondary | ICD-10-CM | POA: Diagnosis not present

## 2015-04-17 DIAGNOSIS — Z7901 Long term (current) use of anticoagulants: Secondary | ICD-10-CM | POA: Diagnosis not present

## 2015-04-18 DIAGNOSIS — L820 Inflamed seborrheic keratosis: Secondary | ICD-10-CM | POA: Diagnosis not present

## 2015-04-18 DIAGNOSIS — D485 Neoplasm of uncertain behavior of skin: Secondary | ICD-10-CM | POA: Diagnosis not present

## 2015-04-18 DIAGNOSIS — L812 Freckles: Secondary | ICD-10-CM | POA: Diagnosis not present

## 2015-04-18 DIAGNOSIS — B351 Tinea unguium: Secondary | ICD-10-CM | POA: Diagnosis not present

## 2015-04-18 DIAGNOSIS — L821 Other seborrheic keratosis: Secondary | ICD-10-CM | POA: Diagnosis not present

## 2015-04-18 DIAGNOSIS — D1801 Hemangioma of skin and subcutaneous tissue: Secondary | ICD-10-CM | POA: Diagnosis not present

## 2015-04-18 DIAGNOSIS — Z85828 Personal history of other malignant neoplasm of skin: Secondary | ICD-10-CM | POA: Diagnosis not present

## 2015-05-13 ENCOUNTER — Emergency Department (HOSPITAL_COMMUNITY): Payer: Medicare Other

## 2015-05-13 ENCOUNTER — Encounter (HOSPITAL_COMMUNITY): Payer: Self-pay

## 2015-05-13 ENCOUNTER — Emergency Department (HOSPITAL_COMMUNITY)
Admission: EM | Admit: 2015-05-13 | Discharge: 2015-05-13 | Disposition: A | Discharge status: Home / Self Care | Payer: Medicare Other | Attending: Emergency Medicine | Admitting: Emergency Medicine

## 2015-05-13 DIAGNOSIS — R519 Headache, unspecified: Secondary | ICD-10-CM

## 2015-05-13 DIAGNOSIS — Z8739 Personal history of other diseases of the musculoskeletal system and connective tissue: Secondary | ICD-10-CM | POA: Insufficient documentation

## 2015-05-13 DIAGNOSIS — Z85828 Personal history of other malignant neoplasm of skin: Secondary | ICD-10-CM | POA: Diagnosis not present

## 2015-05-13 DIAGNOSIS — Z8719 Personal history of other diseases of the digestive system: Secondary | ICD-10-CM | POA: Diagnosis not present

## 2015-05-13 DIAGNOSIS — I10 Essential (primary) hypertension: Secondary | ICD-10-CM | POA: Diagnosis not present

## 2015-05-13 DIAGNOSIS — F419 Anxiety disorder, unspecified: Secondary | ICD-10-CM | POA: Diagnosis not present

## 2015-05-13 DIAGNOSIS — Z87891 Personal history of nicotine dependence: Secondary | ICD-10-CM | POA: Insufficient documentation

## 2015-05-13 DIAGNOSIS — R51 Headache: Secondary | ICD-10-CM | POA: Insufficient documentation

## 2015-05-13 DIAGNOSIS — Z950 Presence of cardiac pacemaker: Secondary | ICD-10-CM | POA: Diagnosis not present

## 2015-05-13 DIAGNOSIS — I4891 Unspecified atrial fibrillation: Secondary | ICD-10-CM | POA: Diagnosis not present

## 2015-05-13 DIAGNOSIS — IMO0001 Reserved for inherently not codable concepts without codable children: Secondary | ICD-10-CM

## 2015-05-13 DIAGNOSIS — R030 Elevated blood-pressure reading, without diagnosis of hypertension: Secondary | ICD-10-CM

## 2015-05-13 DIAGNOSIS — Z79899 Other long term (current) drug therapy: Secondary | ICD-10-CM | POA: Diagnosis not present

## 2015-05-13 DIAGNOSIS — G4489 Other headache syndrome: Secondary | ICD-10-CM | POA: Diagnosis not present

## 2015-05-13 DIAGNOSIS — Z8669 Personal history of other diseases of the nervous system and sense organs: Secondary | ICD-10-CM | POA: Diagnosis not present

## 2015-05-13 DIAGNOSIS — Z7901 Long term (current) use of anticoagulants: Secondary | ICD-10-CM | POA: Insufficient documentation

## 2015-05-13 DIAGNOSIS — Z87442 Personal history of urinary calculi: Secondary | ICD-10-CM | POA: Insufficient documentation

## 2015-05-13 DIAGNOSIS — Z8701 Personal history of pneumonia (recurrent): Secondary | ICD-10-CM | POA: Diagnosis not present

## 2015-05-13 LAB — I-STAT TROPONIN, ED: Troponin i, poc: 0 ng/mL (ref 0.00–0.08)

## 2015-05-13 LAB — CBC WITH DIFFERENTIAL/PLATELET
BASOS PCT: 0 %
Basophils Absolute: 0 10*3/uL (ref 0.0–0.1)
EOS PCT: 1 %
Eosinophils Absolute: 0.1 10*3/uL (ref 0.0–0.7)
HEMATOCRIT: 40.6 % (ref 36.0–46.0)
Hemoglobin: 13.4 g/dL (ref 12.0–15.0)
Lymphocytes Relative: 15 %
Lymphs Abs: 1.2 10*3/uL (ref 0.7–4.0)
MCH: 29.5 pg (ref 26.0–34.0)
MCHC: 33 g/dL (ref 30.0–36.0)
MCV: 89.4 fL (ref 78.0–100.0)
MONO ABS: 0.7 10*3/uL (ref 0.1–1.0)
MONOS PCT: 8 %
NEUTROS ABS: 6.3 10*3/uL (ref 1.7–7.7)
Neutrophils Relative %: 76 %
PLATELETS: 305 10*3/uL (ref 150–400)
RBC: 4.54 MIL/uL (ref 3.87–5.11)
RDW: 14 % (ref 11.5–15.5)
WBC: 8.3 10*3/uL (ref 4.0–10.5)

## 2015-05-13 LAB — COMPREHENSIVE METABOLIC PANEL
ALBUMIN: 3.9 g/dL (ref 3.5–5.0)
ALT: 16 U/L (ref 14–54)
ANION GAP: 14 (ref 5–15)
AST: 27 U/L (ref 15–41)
Alkaline Phosphatase: 81 U/L (ref 38–126)
BILIRUBIN TOTAL: 0.8 mg/dL (ref 0.3–1.2)
BUN: 10 mg/dL (ref 6–20)
CO2: 26 mmol/L (ref 22–32)
Calcium: 9.7 mg/dL (ref 8.9–10.3)
Chloride: 95 mmol/L — ABNORMAL LOW (ref 101–111)
Creatinine, Ser: 0.65 mg/dL (ref 0.44–1.00)
GFR calc Af Amer: >60 mL/min (ref >60)
GLUCOSE: 86 mg/dL (ref 65–99)
POTASSIUM: 3.7 mmol/L (ref 3.5–5.1)
Sodium: 135 mmol/L (ref 135–145)
TOTAL PROTEIN: 6.9 g/dL (ref 6.5–8.1)

## 2015-05-13 LAB — URINALYSIS, ROUTINE W REFLEX MICROSCOPIC
BILIRUBIN URINE: NEGATIVE
Glucose, UA: NEGATIVE mg/dL
HGB URINE DIPSTICK: NEGATIVE
KETONES UR: NEGATIVE mg/dL
Nitrite: NEGATIVE
PROTEIN: NEGATIVE mg/dL
Specific Gravity, Urine: 1.008 (ref 1.005–1.030)
pH: 7.5 (ref 5.0–8.0)

## 2015-05-13 LAB — URINE MICROSCOPIC-ADD ON

## 2015-05-13 LAB — BRAIN NATRIURETIC PEPTIDE: B NATRIURETIC PEPTIDE 5: 134.9 pg/mL — AB (ref 0.0–100.0)

## 2015-05-13 LAB — PROTIME-INR
INR: 2.43 — ABNORMAL HIGH (ref 0.00–1.49)
PROTHROMBIN TIME: 26.1 s — AB (ref 11.6–15.2)

## 2015-05-13 MED ORDER — CLONIDINE HCL 0.1 MG PO TABS
0.1000 mg | ORAL_TABLET | Freq: Once | ORAL | Status: AC
  Administered 2015-05-13: 0.1 mg via ORAL
  Filled 2015-05-13: qty 1

## 2015-05-13 NOTE — Discharge Instructions (Signed)
You may take half a tablet of your clonidine once a day if your blood pressure rises to greater than a systolic of [id number]. Call and make appointment to follow-up with your primary physician regarding blood pressure management. Return immediately for persistently elevated blood pressure, worsening headache, weakness, visual changes or for any concerns.   Hypertension Hypertension, commonly called high blood pressure, is when the force of blood pumping through your arteries is too strong. Your arteries are the blood vessels that carry blood from your heart throughout your body. A blood pressure reading consists of a higher number over a lower number, such as 110/72. The higher number (systolic) is the pressure inside your arteries when your heart pumps. The lower number (diastolic) is the pressure inside your arteries when your heart relaxes. Ideally you want your blood pressure below 120/80. Hypertension forces your heart to work harder to pump blood. Your arteries may become narrow or stiff. Having untreated or uncontrolled hypertension can cause heart attack, stroke, kidney disease, and other problems. RISK FACTORS Some risk factors for high blood pressure are controllable. Others are not.  Risk factors you cannot control include:   Race. You may be at higher risk if you are African American.  Age. Risk increases with age.  Gender. Men are at higher risk than women before age 29 years. After age 47, women are at higher risk than men. Risk factors you can control include:  Not getting enough exercise or physical activity.  Being overweight.  Getting too much fat, sugar, calories, or salt in your diet.  Drinking too much alcohol. SIGNS AND SYMPTOMS Hypertension does not usually cause signs or symptoms. Extremely high blood pressure (hypertensive crisis) may cause headache, anxiety, shortness of breath, and nosebleed. DIAGNOSIS To check if you have hypertension, your health care provider will  measure your blood pressure while you are seated, with your arm held at the level of your heart. It should be measured at least twice using the same arm. Certain conditions can cause a difference in blood pressure between your right and left arms. A blood pressure reading that is higher than normal on one occasion does not mean that you need treatment. If it is not clear whether you have high blood pressure, you may be asked to return on a different day to have your blood pressure checked again. Or, you may be asked to monitor your blood pressure at home for 1 or more weeks. TREATMENT Treating high blood pressure includes making lifestyle changes and possibly taking medicine. Living a healthy lifestyle can help lower high blood pressure. You may need to change some of your habits. Lifestyle changes may include:  Following the DASH diet. This diet is high in fruits, vegetables, and whole grains. It is low in salt, red meat, and added sugars.  Keep your sodium intake below 2,300 mg per day.  Getting at least 30-45 minutes of aerobic exercise at least 4 times per week.  Losing weight if necessary.  Not smoking.  Limiting alcoholic beverages.  Learning ways to reduce stress. Your health care provider may prescribe medicine if lifestyle changes are not enough to get your blood pressure under control, and if one of the following is true:  You are 69-27 years of age and your systolic blood pressure is above 140.  You are 75 years of age or older, and your systolic blood pressure is above 150.  Your diastolic blood pressure is above 90.  You have diabetes, and your systolic blood  pressure is over [id number] or your diastolic blood pressure is over 90.  You have kidney disease and your blood pressure is above 140/90.  You have heart disease and your blood pressure is above 140/90. Your personal target blood pressure may vary depending on your medical conditions, your age, and other factors. HOME CARE  INSTRUCTIONS  Have your blood pressure rechecked as directed by your health care provider.   Take medicines only as directed by your health care provider. Follow the directions carefully. Blood pressure medicines must be taken as prescribed. The medicine does not work as well when you skip doses. Skipping doses also puts you at risk for problems.  Do not smoke.   Monitor your blood pressure at home as directed by your health care provider. SEEK MEDICAL CARE IF:   You think you are having a reaction to medicines taken.  You have recurrent headaches or feel dizzy.  You have swelling in your ankles.  You have trouble with your vision. SEEK IMMEDIATE MEDICAL CARE IF:  You develop a severe headache or confusion.  You have unusual weakness, numbness, or feel faint.  You have severe chest or abdominal pain.  You vomit repeatedly.  You have trouble breathing. MAKE SURE YOU:   Understand these instructions.  Will watch your condition.  Will get help right away if you are not doing well or get worse.   This information is not intended to replace advice given to you by your health care provider. Make sure you discuss any questions you have with your health care provider.   Document Released: 03/15/2005 Document Revised: 07/30/2014 Document Reviewed: 01/05/2013 Elsevier Interactive Patient Education [employer].

## 2015-05-13 NOTE — ED Notes (Signed)
Pt. BIB EMS for complaint of dull headache x 2 days. Pt. With hx of HTN. Pt. Was taking BP at home and machine read high.

## 2015-05-13 NOTE — ED Provider Notes (Signed)
CSN: [id number]     Arrival date & time    History   First MD Initiated Contact with Patient 05/13/15 1851     Chief Complaint  Patient presents with  . Hypertension  . Headache     (Consider location/radiation/quality/duration/timing/severity/associated sxs/prior Treatment) HPI Patient presents with persistently elevated blood pressure over the last few days. States she took her blood pressure today and the systolic was greater than 180. She's had no recent changes in her blood pressure medication. No missed doses. She states she's had a dull bitemporal headache for the past 2 days. There is no visual changes. She denies any nausea or vomiting. She's had no chest pain. She has ongoing shortness of breath which is unchanged recently. No new lower extremity swelling. Denies any recent illnesses. No fever or chills. Past Medical History  Diagnosis Date  . Pneumonia   . Osteoarthritis   . Hypertension   . Atrial fibrillation ([place])   . Bradycardia   . Complication of anesthesia   . PONV (postoperative nausea and vomiting)   . Dysrhythmia     A FIB  . Pacemaker   . Shortness of breath     WITH EXERTION  . History of skin cancer   . H/O hiatal hernia   . History of kidney stones   . History of transfusion   . Difficulty sleeping   . Anxiety    Past Surgical History  Procedure Laterality Date  . Microdiskectomy  05/2004    Left L4-L5  . Arthroplasty  1994    Left total knee  . Arthroplasty  2000    Total right knee  . Dg selected hsg gdc only  2002    Dilation  . Appendectomy  1960  . Total abdominal hysterectomy  1969  . Pacemaker insertion    . Joint replacement      knee replacement  . Cholecystectomy    . Back surgery    . Total hip arthroplasty Right 12/24/2013    Procedure: RIGHT TOTAL HIP ARTHROPLASTY ANTERIOR APPROACH;  Surgeon: Gearlean Alf, MD;  Location: [facility];  Service: Orthopedics;  Laterality: Right;   Family History  Problem Relation Age of Onset  .  Heart disease Mother   . Leukemia Father    Social History  Substance Use Topics  . Smoking status: Former Smoker    Quit date: 12/18/1973  . Smokeless tobacco: None     Comment: Smoker for 25 years and quit at around age 74  . Alcohol Use: No   OB History    No data available     Review of Systems  Constitutional: Negative for fever, chills and fatigue.  HENT: Negative for sinus pressure and sore throat.   Eyes: Negative for visual disturbance.  Respiratory: Negative for shortness of breath.   Gastrointestinal: Negative for nausea, vomiting, abdominal pain, diarrhea and constipation.  Genitourinary: Negative for dysuria, frequency, hematuria and flank pain.  Musculoskeletal: Negative for back pain, neck pain and neck stiffness.  Skin: Negative for rash and wound.  Neurological: Positive for headaches. Negative for dizziness, weakness, light-headedness and numbness.  All other systems reviewed and are negative.     Allergies  Morphine and related  Home Medications   Prior to Admission medications   Medication Sig Start Date End Date Taking? Authorizing Provider  amLODipine (NORVASC) 10 MG tablet Take 10 mg by mouth every morning.    Yes Historical Provider, MD  cloNIDine (CATAPRES) 0.1 MG tablet Take 0.05-0.1  mg by mouth 2 (two) times daily. Takes one tablet (0.1 mg) in the morning and half a tablet (0.05 mg) at night.   Yes Historical Provider, MD  hydrochlorothiazide 25 MG tablet Take 25 mg by mouth every morning.    Yes Historical Provider, MD  irbesartan (AVAPRO) 300 MG tablet Take 300 mg by mouth every morning.   Yes Historical Provider, MD  potassium chloride SA (K-DUR,KLOR-CON) 20 MEQ tablet Take 20 mEq by mouth every morning.    Yes Historical Provider, MD  pravastatin (PRAVACHOL) 40 MG tablet Take 40 mg by mouth every morning.    Yes Historical Provider, MD  warfarin (COUMADIN) 2 MG tablet Take 1-2 tablets (2-4 mg total) by mouth as directed. Take Coumadin for  three weeks for postoperative protocol and then the patient may resume their previous Coumadin home regimen.  The dose may need to be adjusted based upon the INR.  Please follow the INR and titrate Coumadin dose for a therapeutic range between 2.0 and 3.0 INR.  After completing the three weeks of Coumadin, the patient may resume their previous Coumadin home regimen. Patient taking differently: Take 2-4 mg by mouth See admin instructions. Take 2 tablets on Monday and Friday then take 1 tablet all the other days 12/27/13  Yes [name], PA-C  metoCLOPramide (REGLAN) 10 MG tablet Take 1 tablet (10 mg total) by mouth every 8 (eight) hours as needed for nausea or vomiting (nausea/headache). Patient not taking: Reported on 03/19/2015 01/06/15   Orlie Dakin, MD  traMADol (ULTRAM) 50 MG tablet Take 1 tablet (50 mg total) by mouth every 12 (twelve) hours as needed for severe pain. Patient not taking: Reported on 03/19/2015 01/06/15   Orlie Dakin, MD   BP 153/90 mmHg  Pulse 63  Temp(Src) 98.3 F (36.8 C) (Oral)  Resp 18  Wt 154 lb (69.854 kg)  SpO2 95% Physical Exam  Constitutional: She is oriented to person, place, and time. She appears well-developed and well-nourished. No distress.  HENT:  Head: Normocephalic and atraumatic.  Mouth/Throat: Oropharynx is clear and moist. No oropharyngeal exudate.  No sinus tenderness to percussion. No temporal artery tenderness.  Eyes: EOM are normal. Pupils are equal, round, and reactive to light.  Neck: Normal range of motion. Neck supple.  No meningismus  Cardiovascular: Normal rate and regular rhythm.  Exam reveals no gallop and no friction rub.   No murmur heard. Pulmonary/Chest: Effort normal and breath sounds normal. No respiratory distress. She has no wheezes. She has no rales. She exhibits no tenderness.  Abdominal: Soft. Bowel sounds are normal. She exhibits no distension and no mass. There is no tenderness. There is no rebound and no guarding.   Musculoskeletal: Normal range of motion. She exhibits no edema or tenderness.  No lower extremity swelling or pain. Distal Pulses equal and intact.  Neurological: She is alert and oriented to person, place, and time.  5/5 motor in all extremities. Sensation is fully intact.  Skin: Skin is warm and dry. No rash noted. No erythema.  Psychiatric: She has a normal mood and affect. Her behavior is normal.  Nursing note and vitals reviewed.   ED Course  Procedures (including critical care time) Labs Review Labs Reviewed  COMPREHENSIVE METABOLIC PANEL - Abnormal; Notable for the following:    Chloride 95 (*)    All other components within normal limits  BRAIN NATRIURETIC PEPTIDE - Abnormal; Notable for the following:    B Natriuretic Peptide 134.9 (*)    All  other components within normal limits  URINALYSIS, ROUTINE W REFLEX MICROSCOPIC (NOT AT [facility]) - Abnormal; Notable for the following:    Leukocytes, UA SMALL (*)    All other components within normal limits  PROTIME-INR - Abnormal; Notable for the following:    Prothrombin Time 26.1 (*)    INR 2.43 (*)    All other components within normal limits  URINE MICROSCOPIC-ADD ON - Abnormal; Notable for the following:    Squamous Epithelial / LPF 0-5 (*)    Bacteria, UA RARE (*)    All other components within normal limits  CBC WITH DIFFERENTIAL/PLATELET  Randolm Idol, ED    Imaging Review Dg Chest 2 View  05/13/2015  CLINICAL DATA:  Hypertension and headache for 2 days. Initial encounter. EXAM: CHEST  2 VIEW COMPARISON:  PA and lateral chest 11/12/2014 and 12/18/2013. FINDINGS: Single lead pacing device remains in place. There is some chronic, mild left basilar atelectasis or scar. The lungs are otherwise clear. Heart size is upper normal. No pneumothorax or pleural effusion. No focal bony abnormality. IMPRESSION: No acute disease. Electronically Signed   By: Inge Rise M.D.   On: 05/13/2015 18:58   I have personally reviewed  and evaluated these images and lab results as part of my medical decision-making.   EKG Interpretation None      MDM   Final diagnoses:  Elevated blood pressure  Nonintractable headache, unspecified chronicity pattern, unspecified headache type   Patient is well-appearing. Normal neurologic exam. We'll give dose of oral antihypertensive and screen for possible causes for her increased blood pressure. Likely the patient will need follow-up with her primary physician for blood pressure management.     Julianne Rice, MD 05/13/15 2238

## 2015-05-16 ENCOUNTER — Telehealth: Payer: Self-pay | Admitting: Internal Medicine

## 2015-05-16 DIAGNOSIS — R0609 Other forms of dyspnea: Secondary | ICD-10-CM | POA: Diagnosis not present

## 2015-05-16 DIAGNOSIS — I10 Essential (primary) hypertension: Secondary | ICD-10-CM | POA: Diagnosis not present

## 2015-05-16 DIAGNOSIS — Z7901 Long term (current) use of anticoagulants: Secondary | ICD-10-CM | POA: Diagnosis not present

## 2015-05-16 DIAGNOSIS — I482 Chronic atrial fibrillation: Secondary | ICD-10-CM | POA: Diagnosis not present

## 2015-05-16 NOTE — Telephone Encounter (Signed)
New message    Patient calling   appt made on  2.21.2017   Pt c/o Shortness Of Breath: STAT if SOB developed within the last 24 hours or pt is noticeably SOB on the phone  1. Are you currently SOB (can you hear that pt is SOB on the phone)? No   2. How long have you been experiencing SOB? Awhile    3. Are you SOB when sitting or when up moving around? Both   4. Are you currently experiencing any other symptoms? 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

## 2015-05-16 NOTE — Telephone Encounter (Signed)
Returned call to patient and she says her BP 185/112 on Wed and she went to the hospital.  She says it just spiked all of a sudden.  Says family has told her herface was red for several months.  She was told by Brittney(PA) at Dr Loren Racer office to see Dr Lovena Le.  She will keep her appointment for Tues.  She is not in any distress

## 2015-05-20 ENCOUNTER — Encounter: Payer: Self-pay | Admitting: Internal Medicine

## 2015-05-20 ENCOUNTER — Ambulatory Visit (INDEPENDENT_AMBULATORY_CARE_PROVIDER_SITE_OTHER): Payer: Medicare Other | Admitting: Internal Medicine

## 2015-05-20 VITALS — BP 120/50 | HR 60 | Ht 64.0 in | Wt 155.4 lb

## 2015-05-20 DIAGNOSIS — R0789 Other chest pain: Secondary | ICD-10-CM

## 2015-05-20 DIAGNOSIS — I10 Essential (primary) hypertension: Secondary | ICD-10-CM

## 2015-05-20 DIAGNOSIS — I4891 Unspecified atrial fibrillation: Secondary | ICD-10-CM

## 2015-05-20 LAB — CUP PACEART INCLINIC DEVICE CHECK
Battery Impedance: 1500 Ohm
Brady Statistic RV Percent Paced: 76 %
Implantable Lead Location: 753860
Lead Channel Impedance Value: 343 Ohm
Lead Channel Pacing Threshold Amplitude: 1.25 V
Lead Channel Sensing Intrinsic Amplitude: 16.6 mV
MDC IDC LEAD IMPLANT DT: 20070501
MDC IDC MSMT BATTERY VOLTAGE: 2.78 V
MDC IDC MSMT LEADCHNL RV PACING THRESHOLD PULSEWIDTH: 0.4 ms
MDC IDC SESS DTM: 20170221102634
MDC IDC SET LEADCHNL RV PACING PULSEWIDTH: 0.4 ms
MDC IDC SET LEADCHNL RV SENSING SENSITIVITY: 2 mV
Pulse Gen Model: 5156
Pulse Gen Serial Number: 1664242

## 2015-05-20 MED ORDER — FUROSEMIDE 20 MG PO TABS: 20.0000 mg | ORAL_TABLET | Freq: Every day | ORAL | Status: DC

## 2015-05-20 NOTE — Progress Notes (Signed)
HPI [name] returns today for followup. She is a pleasant 82 yo woman with a h/o symptomatic bradycardia, status post permanent pacemaker insertion. She has hypertension. In the last year, she has undergone PM gen change. She has developed problems with sob, chest and jaw pain and mild peripheral edema. No syncope.  Allergies  Allergen Reactions  . Morphine And Related Nausea And Vomiting     Current Outpatient Prescriptions  Medication Sig Dispense Refill  . amLODipine (NORVASC) 10 MG tablet Take 10 mg by mouth every morning.     . clonazePAM (KLONOPIN) 0.5 MG tablet Take 0.5 mg by mouth daily.    . cloNIDine (CATAPRES) 0.1 MG tablet Take 0.05-0.1 mg by mouth 2 (two) times daily.     . hydrochlorothiazide 25 MG tablet Take 25 mg by mouth every morning.     . irbesartan (AVAPRO) 300 MG tablet Take 300 mg by mouth every morning.    . potassium chloride SA (K-DUR,KLOR-CON) 20 MEQ tablet Take 20 mEq by mouth every morning.     . pravastatin (PRAVACHOL) 40 MG tablet Take 40 mg by mouth every morning.     . warfarin (COUMADIN) 2 MG tablet Take 1-2 tablets (2-4 mg total) by mouth as directed. Take Coumadin for three weeks for postoperative protocol and then the patient may resume their previous Coumadin home regimen.  The dose may need to be adjusted based upon the INR.  Please follow the INR and titrate Coumadin dose for a therapeutic range between 2.0 and 3.0 INR.  After completing the three weeks of Coumadin, the patient may resume their previous Coumadin home regimen. (Patient taking differently: Take 2-4 mg by mouth See admin instructions. Take 2 tablets on Monday and Friday then take 1 tablet all the other days) 30 tablet 0   No current facility-administered medications for this visit.     Past Medical History  Diagnosis Date  . Pneumonia   . Osteoarthritis   . Hypertension   . Atrial fibrillation ([place])   . Bradycardia   . Complication of anesthesia   . PONV (postoperative nausea  and vomiting)   . Dysrhythmia     A FIB  . Pacemaker   . Shortness of breath     WITH EXERTION  . History of skin cancer   . H/O hiatal hernia   . History of kidney stones   . History of transfusion   . Difficulty sleeping   . Anxiety     ROS:   All systems reviewed and negative except as noted in the HPI.   Past Surgical History  Procedure Laterality Date  . Microdiskectomy  05/2004    Left L4-L5  . Arthroplasty  1994    Left total knee  . Arthroplasty  2000    Total right knee  . Dg selected hsg gdc only  2002    Dilation  . Appendectomy  1960  . Total abdominal hysterectomy  1969  . Pacemaker insertion    . Joint replacement      knee replacement  . Cholecystectomy    . Back surgery    . Total hip arthroplasty Right 12/24/2013    Procedure: RIGHT TOTAL HIP ARTHROPLASTY ANTERIOR APPROACH;  Surgeon: Gearlean Alf, MD;  Location: [facility];  Service: Orthopedics;  Laterality: Right;     Family History  Problem Relation Age of Onset  . Heart disease Mother   . Leukemia Father      Social History   Social  History  . Marital Status: Married    Spouse Name: N/A  . Number of Children: N/A  . Years of Education: N/A   Occupational History  . Not on file.   Social History Main Topics  . Smoking status: Former Smoker    Quit date: 12/18/1973  . Smokeless tobacco: Not on file     Comment: Smoker for 25 years and quit at around age 75  . Alcohol Use: No  . Drug Use: No  . Sexual Activity: Not on file   Other Topics Concern  . Not on file   Social History Narrative   Married, husband's name is [name], they have been married since 12   2 sons and 4 grandchildren           BP 120/50 mmHg  Pulse 60  Ht 5\' 4"  (1.626 m)  Wt 155 lb 6.4 oz (70.489 kg)  BMI 26.66 kg/m2  SpO2 97%  Physical Exam:  Well appearing 82 year old woman,NAD HEENT: Unremarkable Neck:  5 cm JVD, no thyromegally Back:  No CVA tenderness Lungs:  Clear with no wheezes HEART:   Regular rate rhythm, no murmurs, no rubs, no clicks Abd:  soft, positive bowel sounds, no organomegally, no rebound, no guarding Ext:  2 plus pulses, no edema, no cyanosis, no clubbing Skin:  No rashes no nodules Neuro:  CN II through XII intact, motor grossly intact   DEVICE  Normal device function.  See PaceArt for details.   Assess/Plan: 1. PPM - her [employer] device is working normally. Will recheck in several months. 2. Chest pain - her symptoms are both typical and atypical. She is paced so she will require a Lexiscan myoview. 3. Sob - she is on a low dose of HCTZ. I have recommended she switch to lasix.  4. HTN - her blood pressure is controlled today but has been elevated at times, requiring more clonidine. She is encouraged to maintain a low sodium diet.  Mikle Bosworth.D.

## 2015-05-20 NOTE — Patient Instructions (Signed)
Medication Instructions:  Your physician has recommended you make the following change in your medication:  1) Stop HCTZ 2) Start Furosemide 20 mg daily   Labwork: None ordered   Testing/Procedures: Your physician has requested that you have a lexiscan myoview. For further information please visit [email or web address]. Please follow instruction sheet, as given.    Follow-Up: Your physician wants you to follow-up in: 6 months in the device clinic and 12 months with Dr Knox Saliva will receive a reminder letter in the mail two months in advance. If you don't receive a letter, please call our office to schedule the follow-up appointment.   Any Other Special Instructions Will Be Listed Below (If Applicable).     If you need a refill on your cardiac medications before your next appointment, please call your pharmacy.

## 2015-05-26 DIAGNOSIS — E441 Mild protein-calorie malnutrition: Secondary | ICD-10-CM | POA: Diagnosis not present

## 2015-05-26 DIAGNOSIS — R0609 Other forms of dyspnea: Secondary | ICD-10-CM | POA: Diagnosis not present

## 2015-05-26 DIAGNOSIS — E871 Hypo-osmolality and hyponatremia: Secondary | ICD-10-CM | POA: Diagnosis not present

## 2015-05-26 DIAGNOSIS — I10 Essential (primary) hypertension: Secondary | ICD-10-CM | POA: Diagnosis not present

## 2015-05-26 DIAGNOSIS — E042 Nontoxic multinodular goiter: Secondary | ICD-10-CM | POA: Diagnosis not present

## 2015-05-26 DIAGNOSIS — E7800 Pure hypercholesterolemia, unspecified: Secondary | ICD-10-CM | POA: Diagnosis not present

## 2015-05-26 DIAGNOSIS — D692 Other nonthrombocytopenic purpura: Secondary | ICD-10-CM | POA: Diagnosis not present

## 2015-05-26 DIAGNOSIS — I679 Cerebrovascular disease, unspecified: Secondary | ICD-10-CM | POA: Diagnosis not present

## 2015-05-26 DIAGNOSIS — G459 Transient cerebral ischemic attack, unspecified: Secondary | ICD-10-CM | POA: Diagnosis not present

## 2015-05-26 DIAGNOSIS — Z6826 Body mass index (BMI) 26.0-26.9, adult: Secondary | ICD-10-CM | POA: Diagnosis not present

## 2015-05-26 DIAGNOSIS — I482 Chronic atrial fibrillation: Secondary | ICD-10-CM | POA: Diagnosis not present

## 2015-05-26 DIAGNOSIS — Z1389 Encounter for screening for other disorder: Secondary | ICD-10-CM | POA: Diagnosis not present

## 2015-05-27 ENCOUNTER — Encounter: Payer: Self-pay | Admitting: Internal Medicine

## 2015-05-27 ENCOUNTER — Telehealth (HOSPITAL_COMMUNITY): Payer: Self-pay | Admitting: *Deleted

## 2015-05-27 NOTE — Telephone Encounter (Signed)
Attempted to call patient regarding upcoming appointment- no answer. Leo Weyandt J Karis Emig, RN 

## 2015-05-29 ENCOUNTER — Ambulatory Visit (HOSPITAL_COMMUNITY): Payer: Medicare Other | Attending: Cardiology

## 2015-05-29 DIAGNOSIS — I10 Essential (primary) hypertension: Secondary | ICD-10-CM | POA: Diagnosis not present

## 2015-05-29 DIAGNOSIS — R0602 Shortness of breath: Secondary | ICD-10-CM | POA: Insufficient documentation

## 2015-05-29 DIAGNOSIS — R9439 Abnormal result of other cardiovascular function study: Secondary | ICD-10-CM | POA: Diagnosis not present

## 2015-05-29 DIAGNOSIS — R0789 Other chest pain: Secondary | ICD-10-CM | POA: Insufficient documentation

## 2015-05-29 LAB — MYOCARDIAL PERFUSION IMAGING
CHL CUP RESTING HR STRESS: 63 {beats}/min
CSEPPHR: 78 {beats}/min
LV dias vol: 72 mL
LVSYSVOL: 21 mL
NUC STRESS TID: 0.93
RATE: 0.36
SDS: 2
SRS: 3
SSS: 5

## 2015-05-29 MED ORDER — TECHNETIUM TC 99M SESTAMIBI GENERIC - CARDIOLITE
32.6000 | Freq: Once | INTRAVENOUS | Status: AC | PRN
  Administered 2015-05-29: 33 via INTRAVENOUS

## 2015-05-29 MED ORDER — TECHNETIUM TC 99M SESTAMIBI GENERIC - CARDIOLITE
11.0000 | Freq: Once | INTRAVENOUS | Status: AC | PRN
  Administered 2015-05-29: 11 via INTRAVENOUS

## 2015-05-29 MED ORDER — REGADENOSON 0.4 MG/5ML IV SOLN
0.4000 mg | Freq: Once | INTRAVENOUS | Status: AC
  Administered 2015-05-29: 0.4 mg via INTRAVENOUS

## 2015-06-17 DIAGNOSIS — G459 Transient cerebral ischemic attack, unspecified: Secondary | ICD-10-CM | POA: Diagnosis not present

## 2015-06-17 DIAGNOSIS — Z7901 Long term (current) use of anticoagulants: Secondary | ICD-10-CM | POA: Diagnosis not present

## 2015-06-17 DIAGNOSIS — I482 Chronic atrial fibrillation: Secondary | ICD-10-CM | POA: Diagnosis not present

## 2015-07-01 DIAGNOSIS — I495 Sick sinus syndrome: Secondary | ICD-10-CM

## 2015-07-15 DIAGNOSIS — I482 Chronic atrial fibrillation: Secondary | ICD-10-CM | POA: Diagnosis not present

## 2015-07-15 DIAGNOSIS — Z7901 Long term (current) use of anticoagulants: Secondary | ICD-10-CM | POA: Diagnosis not present

## 2015-08-14 DIAGNOSIS — I482 Chronic atrial fibrillation: Secondary | ICD-10-CM | POA: Diagnosis not present

## 2015-08-14 DIAGNOSIS — Z7901 Long term (current) use of anticoagulants: Secondary | ICD-10-CM | POA: Diagnosis not present

## 2015-08-19 ENCOUNTER — Encounter: Payer: Self-pay | Admitting: Internal Medicine

## 2015-08-19 DIAGNOSIS — R001 Bradycardia, unspecified: Secondary | ICD-10-CM | POA: Diagnosis not present

## 2015-08-19 DIAGNOSIS — I495 Sick sinus syndrome: Secondary | ICD-10-CM | POA: Diagnosis not present

## 2015-09-10 DIAGNOSIS — M79675 Pain in left toe(s): Secondary | ICD-10-CM | POA: Diagnosis not present

## 2015-09-10 DIAGNOSIS — M79672 Pain in left foot: Secondary | ICD-10-CM | POA: Diagnosis not present

## 2015-09-10 DIAGNOSIS — M25572 Pain in left ankle and joints of left foot: Secondary | ICD-10-CM | POA: Diagnosis not present

## 2015-09-11 DIAGNOSIS — G459 Transient cerebral ischemic attack, unspecified: Secondary | ICD-10-CM | POA: Diagnosis not present

## 2015-09-11 DIAGNOSIS — I482 Chronic atrial fibrillation: Secondary | ICD-10-CM | POA: Diagnosis not present

## 2015-09-11 DIAGNOSIS — Z7901 Long term (current) use of anticoagulants: Secondary | ICD-10-CM | POA: Diagnosis not present

## 2015-09-19 DIAGNOSIS — F4322 Adjustment disorder with anxiety: Secondary | ICD-10-CM | POA: Diagnosis not present

## 2015-09-19 DIAGNOSIS — Z6826 Body mass index (BMI) 26.0-26.9, adult: Secondary | ICD-10-CM | POA: Diagnosis not present

## 2015-09-19 DIAGNOSIS — E042 Nontoxic multinodular goiter: Secondary | ICD-10-CM | POA: Diagnosis not present

## 2015-09-19 DIAGNOSIS — R0609 Other forms of dyspnea: Secondary | ICD-10-CM | POA: Diagnosis not present

## 2015-09-19 DIAGNOSIS — I482 Chronic atrial fibrillation: Secondary | ICD-10-CM | POA: Diagnosis not present

## 2015-09-19 DIAGNOSIS — I679 Cerebrovascular disease, unspecified: Secondary | ICD-10-CM | POA: Diagnosis not present

## 2015-09-19 DIAGNOSIS — E7800 Pure hypercholesterolemia, unspecified: Secondary | ICD-10-CM | POA: Diagnosis not present

## 2015-09-19 DIAGNOSIS — E441 Mild protein-calorie malnutrition: Secondary | ICD-10-CM | POA: Diagnosis not present

## 2015-09-19 DIAGNOSIS — E871 Hypo-osmolality and hyponatremia: Secondary | ICD-10-CM | POA: Diagnosis not present

## 2015-09-19 DIAGNOSIS — I10 Essential (primary) hypertension: Secondary | ICD-10-CM | POA: Diagnosis not present

## 2015-09-19 DIAGNOSIS — M1990 Unspecified osteoarthritis, unspecified site: Secondary | ICD-10-CM | POA: Diagnosis not present

## 2015-09-19 DIAGNOSIS — D692 Other nonthrombocytopenic purpura: Secondary | ICD-10-CM | POA: Diagnosis not present

## 2015-09-23 ENCOUNTER — Emergency Department (HOSPITAL_COMMUNITY): Payer: Medicare Other

## 2015-09-23 ENCOUNTER — Observation Stay (HOSPITAL_COMMUNITY)
Admission: EM | Admit: 2015-09-23 | Discharge: 2015-09-25 | Disposition: A | Discharge status: Home / Self Care | Payer: Medicare Other | Attending: Internal Medicine | Admitting: Internal Medicine

## 2015-09-23 ENCOUNTER — Encounter (HOSPITAL_COMMUNITY): Payer: Self-pay | Admitting: Emergency Medicine

## 2015-09-23 DIAGNOSIS — Z96659 Presence of unspecified artificial knee joint: Secondary | ICD-10-CM | POA: Insufficient documentation

## 2015-09-23 DIAGNOSIS — M1990 Unspecified osteoarthritis, unspecified site: Secondary | ICD-10-CM | POA: Diagnosis not present

## 2015-09-23 DIAGNOSIS — Z7901 Long term (current) use of anticoagulants: Secondary | ICD-10-CM | POA: Insufficient documentation

## 2015-09-23 DIAGNOSIS — Z90710 Acquired absence of both cervix and uterus: Secondary | ICD-10-CM | POA: Insufficient documentation

## 2015-09-23 DIAGNOSIS — G4489 Other headache syndrome: Secondary | ICD-10-CM | POA: Diagnosis not present

## 2015-09-23 DIAGNOSIS — S3992XA Unspecified injury of lower back, initial encounter: Secondary | ICD-10-CM | POA: Diagnosis not present

## 2015-09-23 DIAGNOSIS — Z96641 Presence of right artificial hip joint: Secondary | ICD-10-CM | POA: Diagnosis not present

## 2015-09-23 DIAGNOSIS — F419 Anxiety disorder, unspecified: Secondary | ICD-10-CM | POA: Insufficient documentation

## 2015-09-23 DIAGNOSIS — Z87891 Personal history of nicotine dependence: Secondary | ICD-10-CM | POA: Diagnosis not present

## 2015-09-23 DIAGNOSIS — Y92009 Unspecified place in unspecified non-institutional (private) residence as the place of occurrence of the external cause: Secondary | ICD-10-CM | POA: Insufficient documentation

## 2015-09-23 DIAGNOSIS — M8580 Other specified disorders of bone density and structure, unspecified site: Secondary | ICD-10-CM | POA: Insufficient documentation

## 2015-09-23 DIAGNOSIS — R600 Localized edema: Secondary | ICD-10-CM | POA: Diagnosis not present

## 2015-09-23 DIAGNOSIS — S0990XA Unspecified injury of head, initial encounter: Secondary | ICD-10-CM | POA: Diagnosis not present

## 2015-09-23 DIAGNOSIS — Z87442 Personal history of urinary calculi: Secondary | ICD-10-CM | POA: Diagnosis not present

## 2015-09-23 DIAGNOSIS — Z85828 Personal history of other malignant neoplasm of skin: Secondary | ICD-10-CM | POA: Diagnosis not present

## 2015-09-23 DIAGNOSIS — R42 Dizziness and giddiness: Secondary | ICD-10-CM | POA: Diagnosis present

## 2015-09-23 DIAGNOSIS — W010XXA Fall on same level from slipping, tripping and stumbling without subsequent striking against object, initial encounter: Secondary | ICD-10-CM | POA: Diagnosis not present

## 2015-09-23 DIAGNOSIS — I4891 Unspecified atrial fibrillation: Principal | ICD-10-CM | POA: Diagnosis present

## 2015-09-23 DIAGNOSIS — Z885 Allergy status to narcotic agent status: Secondary | ICD-10-CM | POA: Diagnosis not present

## 2015-09-23 DIAGNOSIS — S199XXA Unspecified injury of neck, initial encounter: Secondary | ICD-10-CM | POA: Diagnosis not present

## 2015-09-23 DIAGNOSIS — I10 Essential (primary) hypertension: Secondary | ICD-10-CM | POA: Insufficient documentation

## 2015-09-23 DIAGNOSIS — S0093XA Contusion of unspecified part of head, initial encounter: Secondary | ICD-10-CM | POA: Diagnosis not present

## 2015-09-23 DIAGNOSIS — I493 Ventricular premature depolarization: Secondary | ICD-10-CM | POA: Diagnosis not present

## 2015-09-23 DIAGNOSIS — S0003XA Contusion of scalp, initial encounter: Secondary | ICD-10-CM | POA: Insufficient documentation

## 2015-09-23 DIAGNOSIS — W19XXXA Unspecified fall, initial encounter: Secondary | ICD-10-CM | POA: Diagnosis present

## 2015-09-23 MED ORDER — FENTANYL CITRATE (PF) 100 MCG/2ML IJ SOLN: 100.0000 ug | Freq: Once | INTRAMUSCULAR | Status: DC

## 2015-09-23 MED ORDER — FENTANYL CITRATE (PF) 100 MCG/2ML IJ SOLN
INTRAMUSCULAR | Status: AC
  Administered 2015-09-23: 100 ug
  Filled 2015-09-23: qty 2

## 2015-09-23 MED ORDER — METOCLOPRAMIDE HCL 5 MG/ML IJ SOLN
10.0000 mg | Freq: Once | INTRAMUSCULAR | Status: AC
  Administered 2015-09-23: 10 mg via INTRAVENOUS
  Filled 2015-09-23: qty 2

## 2015-09-23 NOTE — ED Provider Notes (Addendum)
CSN: [id number]     Arrival date & time 09/23/15  2250 History  By signing my name below, I, Arianna Nassar, attest that this documentation has been prepared under the direction and in the presence of Everlene Balls, MD.  Electronically Signed: Julien Nordmann, ED Scribe. 09/23/2015. 11:12 PM.    Chief Complaint  Patient presents with  . Fall      The history is provided by the patient and the EMS personnel. No language interpreter was used.   HPI Comments: [name] is a 82 y.o. female brought in by ambulance, who has a PMHx of HTN, a-fib, pace maker, and osteoarthritis presents to the Emergency Department complaining of presenting with a fall that occurred PTA. Pt states that she tripped over some chairs, fell on her tailbone and hit the back of her head. EMS notes pt has a hematoma to the back of her head. She complains of a constant, gradual worsening headache, nausea, vomiting, and tailbone pain. Pt says she hit the back of her head and lost consciousness for a few seconds. She notes that when she turns her head it gives her increased pain. She believes that she may have lost her balance but says that she had no preceding symptoms prior to the fall. Granddaughter notes pt has a hx of same mechanical fall in the past multiple times with same symptoms. Pt is currently on coumadin and has a Psychologist, forensic in place. Per granddaughter, pt had to be sedated before  She denies chest pain.  Past Medical History  Diagnosis Date  . Pneumonia   . Osteoarthritis   . Hypertension   . Atrial fibrillation ([place])   . Bradycardia   . Complication of anesthesia   . PONV (postoperative nausea and vomiting)   . Dysrhythmia     A FIB  . Pacemaker   . Shortness of breath     WITH EXERTION  . History of skin cancer   . H/O hiatal hernia   . History of kidney stones   . History of transfusion   . Difficulty sleeping   . Anxiety    Past Surgical History  Procedure Laterality Date  . Microdiskectomy   05/2004    Left L4-L5  . Arthroplasty  1994    Left total knee  . Arthroplasty  2000    Total right knee  . Dg selected hsg gdc only  2002    Dilation  . Appendectomy  1960  . Total abdominal hysterectomy  1969  . Pacemaker insertion    . Joint replacement      knee replacement  . Cholecystectomy    . Back surgery    . Total hip arthroplasty Right 12/24/2013    Procedure: RIGHT TOTAL HIP ARTHROPLASTY ANTERIOR APPROACH;  Surgeon: Gearlean Alf, MD;  Location: [facility];  Service: Orthopedics;  Laterality: Right;   Family History  Problem Relation Age of Onset  . Heart disease Mother   . Leukemia Father    Social History  Substance Use Topics  . Smoking status: Former Smoker    Quit date: 12/18/1973  . Smokeless tobacco: None     Comment: Smoker for 25 years and quit at around age 43  . Alcohol Use: No   OB History    No data available     Review of Systems  A complete 10 system review of systems was obtained and all systems are negative except as noted in the HPI and PMH.  Allergies  Morphine and related  Home Medications   Prior to Admission medications   Medication Sig Start Date End Date Taking? Authorizing Provider  amLODipine (NORVASC) 10 MG tablet Take 10 mg by mouth every morning.     Historical Provider, MD  clonazePAM (KLONOPIN) 0.5 MG tablet Take 0.5 mg by mouth daily. 03/18/15   Historical Provider, MD  cloNIDine (CATAPRES) 0.1 MG tablet Take 0.05-0.1 mg by mouth 2 (two) times daily.     Historical Provider, MD  furosemide (LASIX) 20 MG tablet Take 1 tablet (20 mg total) by mouth daily. 05/20/15   Evans Lance, MD  irbesartan (AVAPRO) 300 MG tablet Take 300 mg by mouth every morning.    Historical Provider, MD  potassium chloride SA (K-DUR,KLOR-CON) 20 MEQ tablet Take 20 mEq by mouth every morning.     Historical Provider, MD  pravastatin (PRAVACHOL) 40 MG tablet Take 40 mg by mouth every morning.     Historical Provider, MD  warfarin (COUMADIN) 2 MG  tablet Take 1-2 tablets (2-4 mg total) by mouth as directed. Take Coumadin for three weeks for postoperative protocol and then the patient may resume their previous Coumadin home regimen.  The dose may need to be adjusted based upon the INR.  Please follow the INR and titrate Coumadin dose for a therapeutic range between 2.0 and 3.0 INR.  After completing the three weeks of Coumadin, the patient may resume their previous Coumadin home regimen. Patient taking differently: Take 2-4 mg by mouth See admin instructions. Take 2 tablets on Monday and Friday then take 1 tablet all the other days 12/27/13   [name], PA-C   There were no vitals taken for this visit. Physical Exam  Constitutional: She is oriented to person, place, and time. She appears well-developed and well-nourished. She appears distressed.  Nasal canula in place C-collar in place In distress  HENT:  Head: Normocephalic.  Nose: Nose normal.  Mouth/Throat: Oropharynx is clear and moist. No oropharyngeal exudate.  2 cm soft tissue hematoma to back of head with TTP  Eyes: Conjunctivae and EOM are normal. Pupils are equal, round, and reactive to light. No scleral icterus.  Neck: Normal range of motion. Neck supple. No JVD present. No tracheal deviation present. No thyromegaly present.  Cardiovascular: Normal rate, regular rhythm and normal heart sounds.  Exam reveals no gallop and no friction rub.   No murmur heard. Pulmonary/Chest: Effort normal and breath sounds normal. No respiratory distress. She has no wheezes. She exhibits no tenderness.  Abdominal: Soft. Bowel sounds are normal. She exhibits no distension and no mass. There is no tenderness. There is no rebound and no guarding.  Musculoskeletal: Normal range of motion. She exhibits edema and tenderness.  Lymphadenopathy:    She has no cervical adenopathy.  Neurological: She is alert and oriented to person, place, and time. No cranial nerve deficit. She exhibits normal muscle  tone.  Bilateral lower extremity edema Normal strength and sensation in all extremities. Normal cerebellar testing.  Skin: Skin is warm and dry. No rash noted. No erythema. No pallor.  Nursing note and vitals reviewed.   ED Course  Procedures  DIAGNOSTIC STUDIES: Oxygen Saturation is 93% on RA, low by my interpretation.  COORDINATION OF CARE:  11:09 PM Will order CT of head. Discussed treatment plan which includes Zofran with pt at bedside and pt agreed to plan.  Labs Review Labs Reviewed  CBC WITH DIFFERENTIAL/PLATELET - Abnormal; Notable for the following:    WBC  14.0 (*)    Neutro Abs 12.1 (*)    All other components within normal limits  BASIC METABOLIC PANEL - Abnormal; Notable for the following:    Potassium 3.3 (*)    Glucose, Bld 133 (*)    All other components within normal limits  PROTIME-INR - Abnormal; Notable for the following:    Prothrombin Time 28.6 (*)    INR 2.74 (*)    All other components within normal limits  TYPE AND SCREEN  ABO/RH    Imaging Review Ct Head Wo Contrast  09/24/2015  CLINICAL DATA:  82 year old female with fall and trauma to the head. EXAM: CT HEAD WITHOUT CONTRAST CT CERVICAL SPINE WITHOUT CONTRAST TECHNIQUE: Multidetector CT imaging of the head and cervical spine was performed following the standard protocol without intravenous contrast. Multiplanar CT image reconstructions of the cervical spine were also generated. COMPARISON:  Cervical spine CT dated 01/06/2015 CT head CT dated 11/12/2014 FINDINGS: CT HEAD FINDINGS The ventricles and sulci are appropriate in size for patient's age. Minimal periventricular and deep white matter chronic microvascular ischemic changes noted. There is no acute intracranial hemorrhage. No mass effect or midline shift noted. The visualized paranasal sinuses and mastoid air cells are clear. The calvarium is intact. Small right occipital hematoma. CT CERVICAL SPINE FINDINGS There is no acute fracture or subluxation  of the cervical spine.There is extensive multilevel degenerative changes of the spine with facet hypertrophy. There is grade 1 C4-C5 and listhesis.The odontoid and spinous processes are intact.There is normal anatomic alignment of the C1-C2 lateral masses. The visualized soft tissues appear unremarkable. IMPRESSION: No acute intracranial pathology. No acute/traumatic cervical spine pathology. Electronically Signed   By: Anner Crete M.D.   On: 09/24/2015 00:24   Ct Cervical Spine Wo Contrast  09/24/2015  CLINICAL DATA:  82 year old female with fall and trauma to the head. EXAM: CT HEAD WITHOUT CONTRAST CT CERVICAL SPINE WITHOUT CONTRAST TECHNIQUE: Multidetector CT imaging of the head and cervical spine was performed following the standard protocol without intravenous contrast. Multiplanar CT image reconstructions of the cervical spine were also generated. COMPARISON:  Cervical spine CT dated 01/06/2015 CT head CT dated 11/12/2014 FINDINGS: CT HEAD FINDINGS The ventricles and sulci are appropriate in size for patient's age. Minimal periventricular and deep white matter chronic microvascular ischemic changes noted. There is no acute intracranial hemorrhage. No mass effect or midline shift noted. The visualized paranasal sinuses and mastoid air cells are clear. The calvarium is intact. Small right occipital hematoma. CT CERVICAL SPINE FINDINGS There is no acute fracture or subluxation of the cervical spine.There is extensive multilevel degenerative changes of the spine with facet hypertrophy. There is grade 1 C4-C5 and listhesis.The odontoid and spinous processes are intact.There is normal anatomic alignment of the C1-C2 lateral masses. The visualized soft tissues appear unremarkable. IMPRESSION: No acute intracranial pathology. No acute/traumatic cervical spine pathology. Electronically Signed   By: Anner Crete M.D.   On: 09/24/2015 00:24   I have personally reviewed and evaluated these images and lab  results as part of my medical decision-making.   EKG Interpretation   Date/Time:  Tuesday September 23 2015 23:46:51 EDT Ventricular Rate:  72 PR Interval:    QRS Duration: 96 QT Interval:  451 QTC Calculation: 494 R Axis:   92 Text Interpretation:  Atrial fibrillation Premature ventricular complexes  No significant change since last tracing Confirmed by Glynn Octave [phone]) on 09/24/2015 12:20:08 AM      MDM   Final  diagnoses:  None   Patient presents to the ED after a fall.  She is on coumadin and is therapeutic.  CT scans are negative for any bleed, do have concern for a possible foci and spoke with the radiologist who confirms it is negative. CT C-spine negative as well. Patient's pain has improved after fentanyl. She was given home instructions on how to decrease her fall risk. Primary care follow-up advised in 3 days. She appears well in no acute distress, vital signs were within her normal limits and she is safe for discharge.    I personally performed the services described in this documentation, which was scribed in my presence. The recorded information has been reviewed and is accurate.     Everlene Balls, MD 09/24/15 0127   Patient still complaining of headache and dizziness.  When attempted to ambulate she nearly fell over.  This patient is normally healthy active and able to care for herself.  Willa dmit to hospitalist.  Dr. Alcario Drought accepts for further care.  Everlene Balls, MD 09/24/15 [phone]

## 2015-09-23 NOTE — ED Notes (Signed)
Pt in EMS after mechanical fall. Tripped and fell on butt, then hit back of head. Hematoma to back of head. Pt does take thinners. A/OX4. Pt hypertensive, has hx of HTN.

## 2015-09-24 ENCOUNTER — Encounter (HOSPITAL_COMMUNITY): Payer: Self-pay | Admitting: Radiology

## 2015-09-24 ENCOUNTER — Observation Stay (HOSPITAL_COMMUNITY): Payer: Medicare Other

## 2015-09-24 ENCOUNTER — Emergency Department (HOSPITAL_COMMUNITY): Payer: Medicare Other

## 2015-09-24 DIAGNOSIS — R42 Dizziness and giddiness: Secondary | ICD-10-CM

## 2015-09-24 DIAGNOSIS — S0990XA Unspecified injury of head, initial encounter: Secondary | ICD-10-CM | POA: Diagnosis not present

## 2015-09-24 DIAGNOSIS — I4891 Unspecified atrial fibrillation: Secondary | ICD-10-CM | POA: Diagnosis not present

## 2015-09-24 DIAGNOSIS — W19XXXA Unspecified fall, initial encounter: Secondary | ICD-10-CM

## 2015-09-24 DIAGNOSIS — R51 Headache: Secondary | ICD-10-CM | POA: Diagnosis not present

## 2015-09-24 DIAGNOSIS — I6523 Occlusion and stenosis of bilateral carotid arteries: Secondary | ICD-10-CM | POA: Diagnosis not present

## 2015-09-24 LAB — PROTIME-INR
INR: 2.74 — ABNORMAL HIGH (ref 0.00–1.49)
PROTHROMBIN TIME: 28.6 s — AB (ref 11.6–15.2)

## 2015-09-24 LAB — CBC WITH DIFFERENTIAL/PLATELET
Basophils Absolute: 0 10*3/uL (ref 0.0–0.1)
Basophils Relative: 0 %
EOS ABS: 0 10*3/uL (ref 0.0–0.7)
Eosinophils Relative: 0 %
HEMATOCRIT: 40 % (ref 36.0–46.0)
HEMOGLOBIN: 13.2 g/dL (ref 12.0–15.0)
LYMPHS ABS: 1.3 10*3/uL (ref 0.7–4.0)
LYMPHS PCT: 9 %
MCH: 29.5 pg (ref 26.0–34.0)
MCHC: 33 g/dL (ref 30.0–36.0)
MCV: 89.3 fL (ref 78.0–100.0)
MONOS PCT: 4 %
Monocytes Absolute: 0.6 10*3/uL (ref 0.1–1.0)
NEUTROS ABS: 12.1 10*3/uL — AB (ref 1.7–7.7)
NEUTROS PCT: 87 %
Platelets: 235 10*3/uL (ref 150–400)
RBC: 4.48 MIL/uL (ref 3.87–5.11)
RDW: 14.1 % (ref 11.5–15.5)
WBC: 14 10*3/uL — AB (ref 4.0–10.5)

## 2015-09-24 LAB — BASIC METABOLIC PANEL
Anion gap: 8 (ref 5–15)
BUN: 13 mg/dL (ref 6–20)
CHLORIDE: 102 mmol/L (ref 101–111)
CO2: 27 mmol/L (ref 22–32)
CREATININE: 0.75 mg/dL (ref 0.44–1.00)
Calcium: 9.5 mg/dL (ref 8.9–10.3)
GFR calc Af Amer: >60 mL/min (ref >60)
GFR calc non Af Amer: >60 mL/min (ref >60)
GLUCOSE: 133 mg/dL — AB (ref 65–99)
POTASSIUM: 3.3 mmol/L — AB (ref 3.5–5.1)
Sodium: 137 mmol/L (ref 135–145)

## 2015-09-24 LAB — TYPE AND SCREEN
ABO/RH(D): A POS
ANTIBODY SCREEN: NEGATIVE

## 2015-09-24 LAB — ABO/RH: ABO/RH(D): A POS

## 2015-09-24 MED ORDER — PRAVASTATIN SODIUM 40 MG PO TABS
40.0000 mg | ORAL_TABLET | Freq: Every day | ORAL | Status: DC
  Administered 2015-09-24: 40 mg via ORAL
  Filled 2015-09-24: qty 1

## 2015-09-24 MED ORDER — IRBESARTAN 300 MG PO TABS
300.0000 mg | ORAL_TABLET | Freq: Every morning | ORAL | Status: DC
  Administered 2015-09-24 – 2015-09-25 (×2): 300 mg via ORAL
  Filled 2015-09-24 (×2): qty 1

## 2015-09-24 MED ORDER — SUCRALFATE 1 GM/10ML PO SUSP
1.0000 g | Freq: Once | ORAL | Status: AC
  Administered 2015-09-24: 1 g via ORAL
  Filled 2015-09-24: qty 10

## 2015-09-24 MED ORDER — CLONAZEPAM 0.5 MG PO TABS: 0.5000 mg | ORAL_TABLET | Freq: Every day | ORAL | Status: DC | PRN

## 2015-09-24 MED ORDER — WARFARIN - PHARMACIST DOSING INPATIENT: Freq: Every day | Status: DC

## 2015-09-24 MED ORDER — ONDANSETRON HCL 4 MG PO TABS
4.0000 mg | ORAL_TABLET | Freq: Three times a day (TID) | ORAL | Status: DC | PRN
  Administered 2015-09-24: 4 mg via ORAL
  Filled 2015-09-24: qty 1

## 2015-09-24 MED ORDER — FUROSEMIDE 20 MG PO TABS
20.0000 mg | ORAL_TABLET | Freq: Every day | ORAL | Status: DC
  Administered 2015-09-24 – 2015-09-25 (×2): 20 mg via ORAL
  Filled 2015-09-24 (×2): qty 1

## 2015-09-24 MED ORDER — CLONIDINE HCL 0.1 MG PO TABS
0.1000 mg | ORAL_TABLET | Freq: Two times a day (BID) | ORAL | Status: DC
  Administered 2015-09-24 – 2015-09-25 (×3): 0.1 mg via ORAL
  Filled 2015-09-24 (×3): qty 1

## 2015-09-24 MED ORDER — AMLODIPINE BESYLATE 5 MG PO TABS
10.0000 mg | ORAL_TABLET | Freq: Every morning | ORAL | Status: DC
  Administered 2015-09-24 – 2015-09-25 (×2): 10 mg via ORAL
  Filled 2015-09-24 (×2): qty 2

## 2015-09-24 MED ORDER — IOPAMIDOL (ISOVUE-370) INJECTION 76%
INTRAVENOUS | Status: AC
  Administered 2015-09-24: 50 mL
  Filled 2015-09-24: qty 50

## 2015-09-24 MED ORDER — ALUM & MAG HYDROXIDE-SIMETH 200-200-20 MG/5ML PO SUSP
15.0000 mL | Freq: Four times a day (QID) | ORAL | Status: DC | PRN
  Administered 2015-09-24: 15 mL via ORAL
  Filled 2015-09-24: qty 30

## 2015-09-24 MED ORDER — POTASSIUM CHLORIDE CRYS ER 20 MEQ PO TBCR
20.0000 meq | EXTENDED_RELEASE_TABLET | Freq: Every morning | ORAL | Status: DC
  Administered 2015-09-24 – 2015-09-25 (×2): 20 meq via ORAL
  Filled 2015-09-24 (×2): qty 1

## 2015-09-24 MED ORDER — WARFARIN SODIUM 2 MG PO TABS
2.0000 mg | ORAL_TABLET | Freq: Once | ORAL | Status: AC
  Administered 2015-09-24: 2 mg via ORAL
  Filled 2015-09-24: qty 1

## 2015-09-24 NOTE — Evaluation (Signed)
Physical Therapy Evaluation Patient Details Name: [name] MRN: [id number] DOB: [birth year] Today's Date: 09/24/2015   History of Present Illness  [name] is a 82 y.o. female with medical history significant of a.fib on coumadin, patient presents to the ED after a fall at home today.  Positive for scalp hematoma only and dizziness.  Clinical Impression  Patient presents with decreased independence with mobility due to dizziness associated with horizontal canal BPPV.  Feel she will benefit from skilled PT in the acute setting to allow return home with initial assist and follow up outpatient vestibular rehab.  PT will follow up in AM prior to d/c.     Follow Up Recommendations Outpatient PT (for vestigular rehab)    Equipment Recommendations  None recommended by PT    Recommendations for Other Services       Precautions / Restrictions Precautions Precautions: Fall      Mobility  Bed Mobility Overal bed mobility: Needs Assistance Bed Mobility: Supine to Sit     Supine to sit: +2 for physical assistance;Min assist     General bed mobility comments: rolling in bed for BPPV treatment then sitting straight up from supine  Transfers Overall transfer level: Needs assistance Equipment used: Rolling walker (2 wheeled) Transfers: Sit to/from Stand Sit to Stand: Min guard         General transfer comment: for safety/balance with cues for looking straight ahead at stationary target for visual compensation  Ambulation/Gait Ambulation/Gait assistance: Min guard;Supervision Ambulation Distance (Feet): 130 Feet Assistive device: Rolling walker (2 wheeled) Gait Pattern/deviations: Step-through pattern;Decreased stride length     General Gait Details: slower than usual and with cues for slower turns with switching visual target for visual compensation  Stairs            Wheelchair Mobility    Modified Rankin (Stroke Patients Only)       Balance  Overall balance assessment: Needs assistance   Sitting balance-Leahy Scale: Fair     Standing balance support: Bilateral upper extremity supported Standing balance-Leahy Scale: Poor Standing balance comment: right now reliant on UE support                             Pertinent Vitals/Pain Pain Assessment: Faces Faces Pain Scale: Hurts even more Pain Location: bottom from fall Pain Descriptors / Indicators: Sore Pain Intervention(s): Monitored during session;Repositioned    Home Living Family/patient expects to be discharged to:: Private residence Living Arrangements: Alone Available Help at Discharge: Family;Available 24 hours/day Type of Home: House Home Access: Stairs to enter   [employer] of Steps: 1 Home Layout: One level (has sunken family room) Home Equipment: Environmental consultant - 2 wheels;Walker - 4 wheels;Shower seat;Bedside commode;Grab bars - tub/shower;Hand held shower head Additional Comments: grandaughter reports can have 24 hour assist at least 48-72 hours    Prior Function           Comments: no device previous to admission; reports fell taking a chair off the bar     Hand Dominance        Extremity/Trunk Assessment   Upper Extremity Assessment: Overall WFL for tasks assessed           Lower Extremity Assessment: Overall WFL for tasks assessed         Communication   Communication: No difficulties  Cognition Arousal/Alertness: Awake/alert Behavior During Therapy: WFL for tasks assessed/performed Overall Cognitive Status: Within Functional Limits for tasks assessed  General Comments General comments (skin integrity, edema, etc.): supine head roll R negative for nystagmus; positive for symptoms; to L positive for geotropic nystagmus lasting about 20 seconds; performed barbeque/Lempert maneuver for canalith repositioning with continued horizontal nystagmus on turn to L.  Educated Curator where to  obtain donut pillow for pt due to sacrococcygeal pain from fall as well as in using maternity ice pads for soreness/inflammation.    Exercises        Assessment/Plan    PT Assessment Patient needs continued PT services  PT Diagnosis Other (comment);Abnormality of gait (BPPV)   PT Problem List Decreased activity tolerance;Decreased balance;Decreased mobility;Other (comment);Decreased knowledge of use of DME;Impaired sensation;Decreased safety awareness (vertigo)  PT Treatment Interventions DME instruction;Gait training;Functional mobility training;Patient/family education;Therapeutic activities;Therapeutic exercise;Balance training;Other (comment) (vestibular rehab)   PT Goals (Current goals can be found in the Care Plan section) Acute Rehab PT Goals Patient Stated Goal: To return to independent PT Goal Formulation: With patient/family Time For Goal Achievement: 10/01/15 Potential to Achieve Goals: Good    Frequency Min 3X/week   Barriers to discharge        Co-evaluation               End of Session Equipment Utilized During Treatment: Gait belt Activity Tolerance: Patient tolerated treatment well;Other (comment) (nausea with repositioning maneuvar) Patient left: in chair;with call bell/phone within reach;with family/visitor present      Functional Assessment Tool Used: Clinical Judtgement Functional Limitation: Mobility: Walking and moving around Mobility: Walking and Moving Around Current Status [id number]): At least 20 percent but less than 40 percent impaired, limited or restricted Mobility: Walking and Moving Around Goal Status [phone]): At least 1 percent but less than 20 percent impaired, limited or restricted    Time: 1415-1500 PT Time Calculation (min) (ACUTE ONLY): 45 min   Charges:   PT Evaluation $PT Eval High Complexity: 1 Procedure PT Treatments $Canalith Rep Proc: 8-22 mins   PT G Codes:   PT G-Codes **NOT FOR INPATIENT CLASS** Functional Assessment  Tool Used: Clinical Judtgement Functional Limitation: Mobility: Walking and moving around Mobility: Walking and Moving Around Current Status [id number]): At least 20 percent but less than 40 percent impaired, limited or restricted Mobility: Walking and Moving Around Goal Status [phone]): At least 1 percent but less than 20 percent impaired, limited or restricted    [name] 09/24/2015, 3:29 PM  Magda Kiel, [place] 09/24/2015

## 2015-09-24 NOTE — Discharge Instructions (Signed)
Fall Prevention in the Home [name], take tylenol at home for pain control.  See a primary care physician within 3 days for close follow up.  If any symptoms worsen, come back to the ED immediately. Thank you.  Falls can cause injuries. They can happen to people of all ages. There are many things you can do to make your home safe and to help prevent falls.  WHAT CAN I DO ON THE OUTSIDE OF MY HOME?  Regularly fix the edges of walkways and driveways and fix any cracks.  Remove anything that might make you trip as you walk through a door, such as a raised step or threshold.  Trim any bushes or trees on the path to your home.  Use bright outdoor lighting.  Clear any walking paths of anything that might make someone trip, such as rocks or tools.  Regularly check to see if handrails are loose or broken. Make sure that both sides of any steps have handrails.  Any raised decks and porches should have guardrails on the edges.  Have any leaves, snow, or ice cleared regularly.  Use sand or salt on walking paths during winter.  Clean up any spills in your garage right away. This includes oil or grease spills. WHAT CAN I DO IN THE BATHROOM?   Use night lights.  Install grab bars by the toilet and in the tub and shower. Do not use towel bars as grab bars.  Use non-skid mats or decals in the tub or shower.  If you need to sit down in the shower, use a plastic, non-slip stool.  Keep the floor dry. Clean up any water that spills on the floor as soon as it happens.  Remove soap buildup in the tub or shower regularly.  Attach bath mats securely with double-sided non-slip rug tape.  Do not have throw rugs and other things on the floor that can make you trip. WHAT CAN I DO IN THE BEDROOM?  Use night lights.  Make sure that you have a light by your bed that is easy to reach.  Do not use any sheets or blankets that are too big for your bed. They should not hang down onto the floor.  Have  a firm chair that has side arms. You can use this for support while you get dressed.  Do not have throw rugs and other things on the floor that can make you trip. WHAT CAN I DO IN THE KITCHEN?  Clean up any spills right away.  Avoid walking on wet floors.  Keep items that you use a lot in easy-to-reach places.  If you need to reach something above you, use a strong step stool that has a grab bar.  Keep electrical cords out of the way.  Do not use floor polish or wax that makes floors slippery. If you must use wax, use non-skid floor wax.  Do not have throw rugs and other things on the floor that can make you trip. WHAT CAN I DO WITH MY STAIRS?  Do not leave any items on the stairs.  Make sure that there are handrails on both sides of the stairs and use them. Fix handrails that are broken or loose. Make sure that handrails are as long as the stairways.  Check any carpeting to make sure that it is firmly attached to the stairs. Fix any carpet that is loose or worn.  Avoid having throw rugs at the top or bottom of the stairs.  If you do have throw rugs, attach them to the floor with carpet tape.  Make sure that you have a light switch at the top of the stairs and the bottom of the stairs. If you do not have them, ask someone to add them for you. WHAT ELSE CAN I DO TO HELP PREVENT FALLS?  Wear shoes that:  Do not have high heels.  Have rubber bottoms.  Are comfortable and fit you well.  Are closed at the toe. Do not wear sandals.  If you use a stepladder:  Make sure that it is fully opened. Do not climb a closed stepladder.  Make sure that both sides of the stepladder are locked into place.  Ask someone to hold it for you, if possible.  Clearly mark and make sure that you can see:  Any grab bars or handrails.  First and last steps.  Where the edge of each step is.  Use tools that help you move around (mobility aids) if they are needed. These  include:  Canes.  Walkers.  Scooters.  Crutches.  Turn on the lights when you go into a dark area. Replace any light bulbs as soon as they burn out.  Set up your furniture so you have a clear path. Avoid moving your furniture around.  If any of your floors are uneven, fix them.  If there are any pets around you, be aware of where they are.  Review your medicines with your doctor. Some medicines can make you feel dizzy. This can increase your chance of falling. Ask your doctor what other things that you can do to help prevent falls.   This information is not intended to replace advice given to you by your health care provider. Make sure you discuss any questions you have with your health care provider.   Document Released: 01/09/2009 Document Revised: 07/30/2014 Document Reviewed: 04/19/2014 Elsevier Interactive Patient Education [employer].

## 2015-09-24 NOTE — ED Notes (Signed)
Pt c/o nausea and indigestion; provider made aware

## 2015-09-24 NOTE — ED Notes (Signed)
Patient transported to X-ray 

## 2015-09-24 NOTE — Progress Notes (Signed)
Received patient from ED accompanied by granddaughter. Patient AOx4, VS stable, O2Sat at 94% on RA, skin intact and pain at 2/10.  Patient and granddaughter oriented to room, bed controls and call light.  Admitting [facility] MD paged of patient's arrival to floor.

## 2015-09-24 NOTE — H&P (Signed)
History and Physical    [name] [id number] DOB: Apr 08, 1925 DOA: 09/23/2015   PCP: [name], MD Chief Complaint:  Chief Complaint  Patient presents with  . Fall    HPI: [name] is a 82 y.o. female with medical history significant of a.fib on coumadin, patient presents to the ED after a fall at home today.  She fell and hit the back of head, had dizziness after the fall which persists.  Dizziness is worse when she turns her neck to the left side.  She does have a headache and a "goose egg" on the back of her head after the fall, but denies neck pain.  ED Course: CT head negative for ICH.  Patient being admitted for persistent dizziness.  Review of Systems: As per HPI otherwise 10 point review of systems negative.    Past Medical History  Diagnosis Date  . Pneumonia   . Osteoarthritis   . Hypertension   . Atrial fibrillation ([place])   . Bradycardia   . Complication of anesthesia   . PONV (postoperative nausea and vomiting)   . Dysrhythmia     A FIB  . Pacemaker   . Shortness of breath     WITH EXERTION  . History of skin cancer   . H/O hiatal hernia   . History of kidney stones   . History of transfusion   . Difficulty sleeping   . Anxiety     Past Surgical History  Procedure Laterality Date  . Microdiskectomy  05/2004    Left L4-L5  . Arthroplasty  1994    Left total knee  . Arthroplasty  2000    Total right knee  . Dg selected hsg gdc only  2002    Dilation  . Appendectomy  1960  . Total abdominal hysterectomy  1969  . Pacemaker insertion    . Joint replacement      knee replacement  . Cholecystectomy    . Back surgery    . Total hip arthroplasty Right 12/24/2013    Procedure: RIGHT TOTAL HIP ARTHROPLASTY ANTERIOR APPROACH;  Surgeon: Gearlean Alf, MD;  Location: [facility];  Service: Orthopedics;  Laterality: Right;     reports that she quit smoking about 41 years ago. She does not have any smokeless tobacco history on file. She  reports that she does not drink alcohol or use illicit drugs.  Allergies  Allergen Reactions  . Morphine And Related Nausea And Vomiting    Family History  Problem Relation Age of Onset  . Heart disease Mother   . Leukemia Father      Prior to Admission medications   Medication Sig Start Date End Date Taking? Authorizing Provider  amLODipine (NORVASC) 10 MG tablet Take 10 mg by mouth every morning.    Yes Historical Provider, MD  clonazePAM (KLONOPIN) 0.5 MG tablet Take 0.5 mg by mouth daily as needed for anxiety.  03/18/15  Yes Historical Provider, MD  cloNIDine (CATAPRES) 0.1 MG tablet Take 0.1 mg by mouth 2 (two) times daily.    Yes Historical Provider, MD  furosemide (LASIX) 20 MG tablet Take 1 tablet (20 mg total) by mouth daily. 05/20/15  Yes Evans Lance, MD  irbesartan (AVAPRO) 300 MG tablet Take 300 mg by mouth every morning.   Yes Historical Provider, MD  potassium chloride SA (K-DUR,KLOR-CON) 20 MEQ tablet Take 20 mEq by mouth every morning.    Yes Historical Provider, MD  pravastatin (PRAVACHOL) 40 MG  tablet Take 40 mg by mouth every morning.    Yes Historical Provider, MD  warfarin (COUMADIN) 2 MG tablet Take 1-2 tablets (2-4 mg total) by mouth as directed. Take Coumadin for three weeks for postoperative protocol and then the patient may resume their previous Coumadin home regimen.  The dose may need to be adjusted based upon the INR.  Please follow the INR and titrate Coumadin dose for a therapeutic range between 2.0 and 3.0 INR.  After completing the three weeks of Coumadin, the patient may resume their previous Coumadin home regimen. Patient taking differently: Take 2-4 mg by mouth daily. Take 2 tablets on Monday and Thursday then take 1 tablet all the other days 12/27/13  Yes [name], PA-C    Physical Exam: Filed Vitals:   09/24/15 0315 09/24/15 0345 09/24/15 0415 09/24/15 0445  BP: 157/80 148/69 139/55 153/64  Pulse: 74 67 67 62  Resp: 20 13 14 14   SpO2: 91%  91% 95% 95%      Constitutional: NAD, calm, comfortable Eyes: PERRL, lids and conjunctivae normal ENMT: Mucous membranes are moist. Posterior pharynx clear of any exudate or lesions.Normal dentition.  Neck: normal, supple, no masses, no thyromegaly Respiratory: clear to auscultation bilaterally, no wheezing, no crackles. Normal respiratory effort. No accessory muscle use.  Cardiovascular: Regular rate and rhythm, no murmurs / rubs / gallops. No extremity edema. 2+ pedal pulses. No carotid bruits.  Abdomen: no tenderness, no masses palpated. No hepatosplenomegaly. Bowel sounds positive.  Musculoskeletal: no clubbing / cyanosis. No joint deformity upper and lower extremities. Good ROM, no contractures. Normal muscle tone.  Skin: no rashes, lesions, ulcers. No induration Neurologic: CN 2-12 grossly intact. Sensation intact, DTR normal. Strength 5/5 in all 4.  Psychiatric: Normal judgment and insight. Alert and oriented x 3. Normal mood.    Labs on Admission: I have personally reviewed following labs and imaging studies  CBC:  Recent Labs Lab 09/24/15 0001  WBC 14.0*  NEUTROABS 12.1*  HGB 13.2  HCT 40.0  MCV 89.3  PLT [id number]   Basic Metabolic Panel:  Recent Labs Lab 09/24/15 0001  NA 137  K 3.3*  CL 102  CO2 27  GLUCOSE 133*  BUN 13  CREATININE 0.75  CALCIUM 9.5   GFR: CrCl cannot be calculated (Unknown ideal weight.). Liver Function Tests: No results for input(s): AST, ALT, ALKPHOS, BILITOT, PROT, ALBUMIN in the last 168 hours. No results for input(s): LIPASE, AMYLASE in the last 168 hours. No results for input(s): AMMONIA in the last 168 hours. Coagulation Profile:  Recent Labs Lab 09/24/15 0001  INR 2.74*   Cardiac Enzymes: No results for input(s): CKTOTAL, CKMB, CKMBINDEX, TROPONINI in the last 168 hours. BNP (last 3 results) No results for input(s): PROBNP in the last 8760 hours. HbA1C: No results for input(s): HGBA1C in the last 72 hours. CBG: No  results for input(s): GLUCAP in the last 168 hours. Lipid Profile: No results for input(s): CHOL, HDL, LDLCALC, TRIG, CHOLHDL, LDLDIRECT in the last 72 hours. Thyroid Function Tests: No results for input(s): TSH, T4TOTAL, FREET4, T3FREE, THYROIDAB in the last 72 hours. Anemia Panel: No results for input(s): VITAMINB12, FOLATE, FERRITIN, TIBC, IRON, RETICCTPCT in the last 72 hours. Urine analysis:    Component Value Date/Time   COLORURINE YELLOW 05/13/2015 2000   APPEARANCEUR CLEAR 05/13/2015 2000   LABSPEC 1.008 05/13/2015 2000   PHURINE 7.5 05/13/2015 2000   GLUCOSEU NEGATIVE 05/13/2015 2000   HGBUR NEGATIVE 05/13/2015 2000   [place] NEGATIVE 05/13/2015  [place] 05/13/2015 2000   PROTEINUR NEGATIVE 05/13/2015 2000   UROBILINOGEN 0.2 11/12/2014 1331   NITRITE NEGATIVE 05/13/2015 2000   LEUKOCYTESUR SMALL* 05/13/2015 2000   Sepsis Labs: @LABRCNTIP (procalcitonin:4,lacticidven:4) )No results found for this or any previous visit (from the past 240 hour(s)).   Radiological Exams on Admission: Dg Sacrum/coccyx  09/24/2015  CLINICAL DATA:  82 year old female with fall- EXAM: SACRUM AND COCCYX - 2+ VIEW COMPARISON:  None. FINDINGS: No acute fracture or dislocation. The bones are osteopenic. Degenerative changes of the lower lumbar spine. Right hip arthroplasty. IMPRESSION: No acute fracture or dislocation. Electronically Signed   By: Anner Crete M.D.   On: 09/24/2015 01:24   Ct Head Wo Contrast  09/24/2015  CLINICAL DATA:  82 year old female with fall and trauma to the head. EXAM: CT HEAD WITHOUT CONTRAST CT CERVICAL SPINE WITHOUT CONTRAST TECHNIQUE: Multidetector CT imaging of the head and cervical spine was performed following the standard protocol without intravenous contrast. Multiplanar CT image reconstructions of the cervical spine were also generated. COMPARISON:  Cervical spine CT dated 01/06/2015 CT head CT dated 11/12/2014 FINDINGS: CT HEAD FINDINGS The  ventricles and sulci are appropriate in size for patient's age. Minimal periventricular and deep white matter chronic microvascular ischemic changes noted. There is no acute intracranial hemorrhage. No mass effect or midline shift noted. The visualized paranasal sinuses and mastoid air cells are clear. The calvarium is intact. Small right occipital hematoma. CT CERVICAL SPINE FINDINGS There is no acute fracture or subluxation of the cervical spine.There is extensive multilevel degenerative changes of the spine with facet hypertrophy. There is grade 1 C4-C5 and listhesis.The odontoid and spinous processes are intact.There is normal anatomic alignment of the C1-C2 lateral masses. The visualized soft tissues appear unremarkable. IMPRESSION: No acute intracranial pathology. No acute/traumatic cervical spine pathology. Electronically Signed   By: Anner Crete M.D.   On: 09/24/2015 00:24   Ct Cervical Spine Wo Contrast  09/24/2015  CLINICAL DATA:  82 year old female with fall and trauma to the head. EXAM: CT HEAD WITHOUT CONTRAST CT CERVICAL SPINE WITHOUT CONTRAST TECHNIQUE: Multidetector CT imaging of the head and cervical spine was performed following the standard protocol without intravenous contrast. Multiplanar CT image reconstructions of the cervical spine were also generated. COMPARISON:  Cervical spine CT dated 01/06/2015 CT head CT dated 11/12/2014 FINDINGS: CT HEAD FINDINGS The ventricles and sulci are appropriate in size for patient's age. Minimal periventricular and deep white matter chronic microvascular ischemic changes noted. There is no acute intracranial hemorrhage. No mass effect or midline shift noted. The visualized paranasal sinuses and mastoid air cells are clear. The calvarium is intact. Small right occipital hematoma. CT CERVICAL SPINE FINDINGS There is no acute fracture or subluxation of the cervical spine.There is extensive multilevel degenerative changes of the spine with facet  hypertrophy. There is grade 1 C4-C5 and listhesis.The odontoid and spinous processes are intact.There is normal anatomic alignment of the C1-C2 lateral masses. The visualized soft tissues appear unremarkable. IMPRESSION: No acute intracranial pathology. No acute/traumatic cervical spine pathology. Electronically Signed   By: Anner Crete M.D.   On: 09/24/2015 00:24    EKG: Independently reviewed.  Assessment/Plan Principal Problem:   Fall Active Problems:   ATRIAL FIBRILLATION   Dizziness  Fall and dizziness -  Initial CT negative, getting repeat CT 6 hrs after first to rule out a delayed ICH for this patient who is on coumadin.  Also checking CTA neck to r/o dissection given that her dizziness  worsens with rotation of her neck to the left.  A.Fib - continue home meds, if f/u CT continues to show no bleed then resume coumadin which is on hold for the moment   DVT prophylaxis: INR 2.7 on coumadin Code Status: Full Family Communication: Granddaughter at bedside Consults called: None Admission status: Admit to obs   [place], [facility] Pager [phone] from 7PM-7AM  If 7AM-7PM, please contact the day physician for the patient [email or web address] Password [facility]  09/24/2015, 5:33 AM

## 2015-09-24 NOTE — ED Notes (Signed)
MD at bedside. 

## 2015-09-24 NOTE — Progress Notes (Signed)
Patient seen and examined this morning, admitted early morning with GLF. Patient with significant dizziness, CT head negative x 2, CT angio neck negative. Discussed with PT, appears to have BPPV, will have another session with PT in am and will aim for d/c 6/29   Tahmid Stonehocker M. Cruzita Lederer, MD [facility] [phone]

## 2015-09-24 NOTE — ED Notes (Signed)
Pt c/o worsening dizziness with position changes, Carafate given. Pt has hx of vertigo, reports dizziness is different at present

## 2015-09-24 NOTE — Care Management Obs Status (Signed)
[place] NOTIFICATION   Patient Details  Name: MEASIA GALICIA MRN: [id number] Date of Birth: 02-Jul-1925   Medicare Observation Status Notification Given:  Yes  Explained to patient's grand daughter who voiced understanding and signed.   Marilu Favre, RN 09/24/2015, 8:49 AM

## 2015-09-24 NOTE — Progress Notes (Signed)
ANTICOAGULATION CONSULT NOTE - Initial Consult  Pharmacy Consult for Warfarin Indication: atrial fibrillation  Allergies  Allergen Reactions  . Morphine And Related Nausea And Vomiting    Patient Measurements: Height: 5' 3.5" (161.3 cm) Weight: 153 lb (69.4 kg) IBW/kg (Calculated) : 53.55  Vital Signs: Temp: 97.6 F (36.4 C) (06/28 0546) Temp Source: Oral (06/28 0546) BP: 163/66 mmHg (06/28 0546) Pulse Rate: 70 (06/28 0546)  Labs:  Recent Labs  09/24/15 0001  HGB 13.2  HCT 40.0  PLT 235  LABPROT 28.6*  INR 2.74*  CREATININE 0.75    Estimated Creatinine Clearance: 46.8 mL/min (by C-G formula based on Cr of 0.75).   Medical History: Past Medical History  Diagnosis Date  . Pneumonia   . Osteoarthritis   . Hypertension   . Atrial fibrillation ([place])   . Bradycardia   . Complication of anesthesia   . PONV (postoperative nausea and vomiting)   . Dysrhythmia     A FIB  . Pacemaker   . Shortness of breath     WITH EXERTION  . History of skin cancer   . H/O hiatal hernia   . History of kidney stones   . History of transfusion   . Difficulty sleeping   . Anxiety     Assessment: 51 YOF who [facility] s/p fall with hematoma to the back of the head. The patient was on warfarin PTA for hx Afib - head CT did not show an intracranial bleed so pharmacy has been consulted to resume warfarin dosing this admit.   PTA dose was 2 mg daily EXCEPT for 4 mg on Mon/Thurs - admit INR 2.74 with a last dose on 6/27. CBC wnl  Goal of Therapy:  INR 2-3   Plan:  1. Warfarin 2 mg x 1 dose at 1800 today 2. Daily PT/INR 3. Will continue to monitor for any signs/symptoms of bleeding and will follow up with PT/INR in the a.m.   Alycia Rossetti, PharmD, BCPS Clinical Pharmacist Pager: [phone] 09/24/2015 2:15 PM

## 2015-09-24 NOTE — Care Management Note (Signed)
Case Management Note  Patient Details  Name: RASHANTI KUJATH MRN: [id number] Date of Birth: Aug 01, 1925  Subjective/Objective:                    Action/Plan: PT recommended outpatient vestibular rehabilitation at [facility] . Discussed same with patient and her grand daughter , both in agreement . Outpatient PT ordered in [facility] they will call patient with appointment ( confirmed patient's phone number ) . [place] daughter has contact information for [employer] .   Expected Discharge Date:                  Expected Discharge Plan:  Home/Self Care  In-House Referral:     Discharge planning Services  CM Consult  Post Acute Care Choice:    Choice offered to:  Patient, Adult Children  DME Arranged:    DME Agency:     HH Arranged:    [place] Agency:     Status of Service:  Completed, signed off  If discussed at [place] of Stay Meetings, dates discussed:    Additional Comments:  Marilu Favre, RN 09/24/2015, 3:39 PM

## 2015-09-25 DIAGNOSIS — R42 Dizziness and giddiness: Secondary | ICD-10-CM

## 2015-09-25 DIAGNOSIS — I4891 Unspecified atrial fibrillation: Secondary | ICD-10-CM | POA: Diagnosis not present

## 2015-09-25 LAB — PROTIME-INR
INR: 2.88 — AB (ref 0.00–1.49)
PROTHROMBIN TIME: 29.7 s — AB (ref 11.6–15.2)

## 2015-09-25 LAB — TSH: TSH: 0.035 u[IU]/mL — AB (ref 0.350–4.500)

## 2015-09-25 MED ORDER — UNABLE TO FIND: Status: DC

## 2015-09-25 MED ORDER — WARFARIN SODIUM 2 MG PO TABS
2.0000 mg | ORAL_TABLET | Freq: Once | ORAL | Status: DC
  Filled 2015-09-25: qty 1

## 2015-09-25 NOTE — Discharge Summary (Signed)
Physician Discharge Summary  [name] [id number] DOB: [birth year] DOA: 09/23/2015  PCP: [name], MD  Admit date: 09/23/2015 Discharge date: 09/25/2015  Time spent: > 30 minutes  Recommendations for Outpatient Follow-up:  1. Follow up with PCP as needed 2. Follow up with Cardiology as scheduled   Discharge Diagnoses:  Principal Problem:   Fall Active Problems:   ATRIAL FIBRILLATION   Dizziness  Discharge Condition: stable  Diet recommendation: regular  Filed Weights   09/24/15 0546  Weight: 69.4 kg (153 lb)    History of present illness:  [name] is a 82 y.o. female with medical history significant of a.fib on coumadin, patient presents to the ED after a fall at home today. She fell and hit the back of head, had dizziness after the fall which persists. Dizziness is worse when she turns her neck to the left side. She does have a headache and a "goose egg" on the back of her head after the fall, but denies neck pain.  Hospital Course:  Patient was admitted to the hospital after having a mechanical fall at home and complaints of dizziness. She is on Coumadin and underwent CT imaging of her brain and CTA neck without acute processes. PT evaluated patient and felt her symptoms are consistent with BPPV. She did well with therapy, her dizziness improved and outpatient PT was recommended on discharge. Her other medical problems have remained stable and no changes were made to her home regimen.   Procedures:  None    Consultations:  None   Discharge Exam: Filed Vitals:   09/24/15 0546 09/24/15 1300 09/24/15 2020 09/25/15 0528  BP: 163/66 147/68 157/67 168/70  Pulse: 70 70 72 60  Temp: 97.6 F (36.4 C) 97.5 F (36.4 C) 97.6 F (36.4 C) 98.2 F (36.8 C)  TempSrc: Oral Oral Oral Oral  Resp:  17 19 19   Height: 5' 3.5" (1.613 m)     Weight: 69.4 kg (153 lb)     SpO2: 94% 96% 96% 96%    General: NAD Cardiovascular: RRR Respiratory: CTA  biL  Discharge Instructions Activity:  As tolerated   Get Medicines reviewed and adjusted: Please take all your medications with you for your next visit with your Primary MD  Please request your Primary MD to go over all hospital tests and procedure/radiological results at the follow up, please ask your Primary MD to get all Hospital records sent to his/her office.  If you experience worsening of your admission symptoms, develop shortness of breath, life threatening emergency, suicidal or homicidal thoughts you must seek medical attention immediately by calling 911 or calling your MD immediately if symptoms less severe.  You must read complete instructions/literature along with all the possible adverse reactions/side effects for all the Medicines you take and that have been prescribed to you. Take any new Medicines after you have completely understood and accpet all the possible adverse reactions/side effects.   Do not drive when taking Pain medications.   Do not take more than prescribed Pain, Sleep and Anxiety Medications  Special Instructions: If you have smoked or chewed Tobacco in the last 2 yrs please stop smoking, stop any regular Alcohol and or any Recreational drug use.  Wear Seat belts while driving.  Please note  You were cared for by a hospitalist during your hospital stay. Once you are discharged, your primary care physician will handle any further medical issues. Please note that NO REFILLS for any discharge medications will be  authorized once you are discharged, as it is imperative that you return to your primary care physician (or establish a relationship with a primary care physician if you do not have one) for your aftercare needs so that they can reassess your need for medications and monitor your lab values. Discharge Instructions    Ambulatory referral to Physical Therapy    Complete by:  As directed   Vestibular rehabilitation            Medication List      TAKE these medications        amLODipine 10 MG tablet  Commonly known as:  NORVASC  Take 10 mg by mouth every morning.     clonazePAM 0.5 MG tablet  Commonly known as:  KLONOPIN  Take 0.5 mg by mouth daily as needed for anxiety.     cloNIDine 0.1 MG tablet  Commonly known as:  CATAPRES  Take 0.1 mg by mouth 2 (two) times daily.     furosemide 20 MG tablet  Commonly known as:  LASIX  Take 1 tablet (20 mg total) by mouth daily.     irbesartan 300 MG tablet  Commonly known as:  AVAPRO  Take 300 mg by mouth every morning.     potassium chloride SA 20 MEQ tablet  Commonly known as:  K-DUR,KLOR-CON  Take 20 mEq by mouth every morning.     pravastatin 40 MG tablet  Commonly known as:  PRAVACHOL  Take 40 mg by mouth every morning.     UNABLE TO FIND  Outpatient PT     warfarin 2 MG tablet  Commonly known as:  COUMADIN  Take 1-2 tablets (2-4 mg total) by mouth as directed. Take Coumadin for three weeks for postoperative protocol and then the patient may resume their previous Coumadin home regimen.  The dose may need to be adjusted based upon the INR.  Please follow the INR and titrate Coumadin dose for a therapeutic range between 2.0 and 3.0 INR.  After completing the three weeks of Coumadin, the patient may resume their previous Coumadin home regimen.           Follow-up Information    Follow up with [name], MD. Schedule an appointment as soon as possible for a visit in 3 days.   Specialty:  Internal Medicine   Why:  for close follow up of your falls   Contact information:   [address] [place] [address] [phone]       Follow up with [facility].   Specialty:  Rehabilitation   Why:  If they do not call you in 2 to 3 business days , call them for an appointment    Contact information:   [address] [phone]      The results of  significant diagnostics from this hospitalization (including imaging, microbiology, ancillary and laboratory) are listed below for reference.    Significant Diagnostic Studies: Dg Sacrum/coccyx  09/24/2015  CLINICAL DATA:  82 year old female with fall- EXAM: SACRUM AND COCCYX - 2+ VIEW COMPARISON:  None. FINDINGS: No acute fracture or dislocation. The bones are osteopenic. Degenerative changes of the lower lumbar spine. Right hip arthroplasty. IMPRESSION: No acute fracture or dislocation. Electronically Signed   By: Anner Crete M.D.   On: 09/24/2015 01:24   Ct Head Wo Contrast  09/24/2015  CLINICAL DATA:  Fall last night. Dizziness since fall. Patient takes blood thinner. Rule out  intracranial hemorrhage. Headache. EXAM: CT HEAD WITHOUT CONTRAST TECHNIQUE: Contiguous axial images were obtained from the base of the skull through the vertex without intravenous contrast. COMPARISON:  CT head 09/23/2015 FINDINGS: Ventricle size is normal. Negative for intracranial hemorrhage.  No acute infarct or mass Small scalp hematoma posteriorly on the right unchanged related to trauma. Negative for skull fracture. IMPRESSION: No acute intracranial abnormality and no change from yesterday. Electronically Signed   By: Franchot Gallo M.D.   On: 09/24/2015 08:55   Ct Head Wo Contrast  09/24/2015  CLINICAL DATA:  82 year old female with fall and trauma to the head. EXAM: CT HEAD WITHOUT CONTRAST CT CERVICAL SPINE WITHOUT CONTRAST TECHNIQUE: Multidetector CT imaging of the head and cervical spine was performed following the standard protocol without intravenous contrast. Multiplanar CT image reconstructions of the cervical spine were also generated. COMPARISON:  Cervical spine CT dated 01/06/2015 CT head CT dated 11/12/2014 FINDINGS: CT HEAD FINDINGS The ventricles and sulci are appropriate in size for patient's age. Minimal periventricular and deep white matter chronic microvascular ischemic changes noted. There is no  acute intracranial hemorrhage. No mass effect or midline shift noted. The visualized paranasal sinuses and mastoid air cells are clear. The calvarium is intact. Small right occipital hematoma. CT CERVICAL SPINE FINDINGS There is no acute fracture or subluxation of the cervical spine.There is extensive multilevel degenerative changes of the spine with facet hypertrophy. There is grade 1 C4-C5 and listhesis.The odontoid and spinous processes are intact.There is normal anatomic alignment of the C1-C2 lateral masses. The visualized soft tissues appear unremarkable. IMPRESSION: No acute intracranial pathology. No acute/traumatic cervical spine pathology. Electronically Signed   By: Anner Crete M.D.   On: 09/24/2015 00:24   Ct Angio Neck W Or Wo Contrast  09/24/2015  CLINICAL DATA:  Dizziness. Patient fell last night in the kitchen. Posterior headache. Initial encounter. EXAM: CT ANGIOGRAPHY NECK TECHNIQUE: Multidetector CT imaging of the neck was performed using the standard protocol during bolus administration of intravenous contrast. Multiplanar CT image reconstructions and MIPs were obtained to evaluate the vascular anatomy. Carotid stenosis measurements (when applicable) are obtained utilizing NASCET criteria, using the distal internal carotid diameter as the denominator. CONTRAST:  50 mL Isovue 370 COMPARISON:  None. FINDINGS: Aortic arch: 3 vessel aortic arch with mild atherosclerotic plaque. Brachiocephalic and subclavian arteries are widely patent. There is mild non stenotic plaque in the proximal left subclavian artery. Right carotid system: Patent with minimal non stenotic plaque at the carotid bifurcation. Mild tortuosity and mild irregularity of the mid cervical ICA. Left carotid system: Patent with mild non stenotic plaque at the carotid bifurcation. Vertebral arteries: Patent with the left being slightly larger than the right. Small focus of calcified plaque near the left V3 - V4 junction. No  stenosis. Skeleton: Advanced left-sided upper cervical facet arthrosis with slight anterolisthesis of C3 on C4 and C4 on C5. Advanced disc degeneration at C5-6 and C6-7. Moderate calcified pannus posterior to the dens. Other neck: Prior bilateral cataract extraction. Marked heterogeneity and moderate enlargement of the thyroid gland with substernal extension of the left lobe. The thyroid was evaluated by ultrasound and biopsy in November/ December 2016. Mild motion artifact through the lung apices with scattered areas of scarring, mild dependent atelectasis, and a few subcentimeter nodular densities which are nonspecific but may be infectious/ inflammatory. IMPRESSION: 1. Mild atherosclerosis without stenosis. 2. Thyroid goiter. Electronically Signed   By: Logan Bores M.D.   On: 09/24/2015 09:13   Ct Cervical  Spine Wo Contrast  09/24/2015  CLINICAL DATA:  82 year old female with fall and trauma to the head. EXAM: CT HEAD WITHOUT CONTRAST CT CERVICAL SPINE WITHOUT CONTRAST TECHNIQUE: Multidetector CT imaging of the head and cervical spine was performed following the standard protocol without intravenous contrast. Multiplanar CT image reconstructions of the cervical spine were also generated. COMPARISON:  Cervical spine CT dated 01/06/2015 CT head CT dated 11/12/2014 FINDINGS: CT HEAD FINDINGS The ventricles and sulci are appropriate in size for patient's age. Minimal periventricular and deep white matter chronic microvascular ischemic changes noted. There is no acute intracranial hemorrhage. No mass effect or midline shift noted. The visualized paranasal sinuses and mastoid air cells are clear. The calvarium is intact. Small right occipital hematoma. CT CERVICAL SPINE FINDINGS There is no acute fracture or subluxation of the cervical spine.There is extensive multilevel degenerative changes of the spine with facet hypertrophy. There is grade 1 C4-C5 and listhesis.The odontoid and spinous processes are intact.There  is normal anatomic alignment of the C1-C2 lateral masses. The visualized soft tissues appear unremarkable. IMPRESSION: No acute intracranial pathology. No acute/traumatic cervical spine pathology. Electronically Signed   By: Anner Crete M.D.   On: 09/24/2015 00:24   Labs: Basic Metabolic Panel:  Recent Labs Lab 09/24/15 0001  NA 137  K 3.3*  CL 102  CO2 27  GLUCOSE 133*  BUN 13  CREATININE 0.75  CALCIUM 9.5   CBC:  Recent Labs Lab 09/24/15 0001  WBC 14.0*  NEUTROABS 12.1*  HGB 13.2  HCT 40.0  MCV 89.3  PLT 235   BNP: BNP (last 3 results)  Recent Labs  05/13/15 1830  BNP 134.9*     Signed:  GHERGHE, COSTIN  [facility] 09/25/2015, 3:22 PM

## 2015-09-25 NOTE — Progress Notes (Signed)
Physical Therapy Treatment Patient Details Name: [name] MRN: [id number] DOB: 1926/02/23 Today's Date: 09/25/2015    History of Present Illness DELIMAR SISEMORE is a 82 y.o. female with medical history significant of a.fib on coumadin, patient presents to the ED after a fall at home today.  Positive for scalp hematoma only and dizziness.    PT Comments    Patient with improved ease of mobility this session with OOB and hallway ambulation with walker.  Noted rotary nystagmus with head turned R so treated for R posterior canal BPPV.  Still likely has multicanal involvement and will need follow up outpatient vestibular rehab.   Follow Up Recommendations  Outpatient PT (for vestibular rehab)     Equipment Recommendations  None recommended by PT    Recommendations for Other Services       Precautions / Restrictions Precautions Precautions: Fall Restrictions Weight Bearing Restrictions: No    Mobility  Bed Mobility   Bed Mobility: Sit to Supine     Supine to sit: Supervision;HOB elevated Sit to supine: Min assist   General bed mobility comments: with rail initially able to rise unaided, back to bed with assist for legs due to weakness after Tx for BPPV  Transfers Overall transfer level: Needs assistance Equipment used: Rolling walker (2 wheeled) Transfers: Sit to/from Stand Sit to Stand: Supervision            Ambulation/Gait   Ambulation Distance (Feet): 200 Feet Assistive device: Rolling walker (2 wheeled) Gait Pattern/deviations: Step-through pattern;Decreased stride length     General Gait Details: close to normal speed with slower technique on turns finding new target; did encourage L head turn with ambulation without LOB or c/o dizziness   Stairs            Wheelchair Mobility    Modified Rankin (Stroke Patients Only)       Balance Overall balance assessment: Needs assistance   Sitting balance-Leahy Scale: Good     Standing  balance support: Bilateral upper extremity supported Standing balance-Leahy Scale: Poor                      Cognition Arousal/Alertness: Awake/alert Behavior During Therapy: WFL for tasks assessed/performed Overall Cognitive Status: Within Functional Limits for tasks assessed                      Exercises      General Comments General comments (skin integrity, edema, etc.): supine head roll L noted mild very brief L horizontal nystagmus; R head turn positive for moderate intensity R upbeating rotary nystagmus lasting about 15 sec; performed R dix hallpike positive for rotary nystagmus and performed Eply canalith repositioning; positive for slow long lasting horizontal nystagmus (?ageotropic) on final turn.        Pertinent Vitals/Pain Faces Pain Scale: Hurts little more Pain Location: coccyx from fall Pain Descriptors / Indicators: Sore Pain Intervention(s): Repositioned;Monitored during session    Home Living                      Prior Function            PT Goals (current goals can now be found in the care plan section) Progress towards PT goals: Progressing toward goals    Frequency  Min 3X/week    PT Plan Current plan remains appropriate    Co-evaluation             End of  Session Equipment Utilized During Treatment: Gait belt Activity Tolerance: Patient tolerated treatment well;Other (comment) (nausea with canalith repositioning) Patient left: in bed;with bed alarm set;with call bell/phone within reach     Time: [id number] PT Time Calculation (min) (ACUTE ONLY): 28 min  Charges:  $Gait Training: 8-22 mins $Canalith Rep Proc: 8-22 mins                    G Codes:  Mobility: Walking and Moving Around Goal Status [phone]): At least 1 percent but less than 20 percent impaired, limited or restricted Mobility: Walking and Moving Around Discharge Status [phone]): At least 1 percent but less than 20 percent impaired, limited or  restricted   [name] 09/25/2015, 1:15 PM Magda Kiel, [place] 09/25/2015

## 2015-09-25 NOTE — Progress Notes (Signed)
ANTICOAGULATION CONSULT NOTE - Follow Up Consult  Pharmacy Consult for Warfarin Indication: atrial fibrillation  Allergies  Allergen Reactions  . Morphine And Related Nausea And Vomiting    Patient Measurements: Height: 5' 3.5" (161.3 cm) Weight: 153 lb (69.4 kg) IBW/kg (Calculated) : 53.55  Vital Signs: Temp: 98.2 F (36.8 C) (06/29 0528) Temp Source: Oral (06/29 0528) BP: 168/70 mmHg (06/29 0528) Pulse Rate: 60 (06/29 0528)  Labs:  Recent Labs  09/24/15 0001 09/25/15 1211  HGB 13.2  --   HCT 40.0  --   PLT 235  --   LABPROT 28.6* 29.7*  INR 2.74* 2.88*  CREATININE 0.75  --     Estimated Creatinine Clearance: 46.8 mL/min (by C-G formula based on Cr of 0.75).   Assessment: 59 YOF who presented to the [facility] on 6/28 s/p fall with hematoma to the back of the head. The patient was on warfarin PTA for hx Afib - head CT did not show an intracranial bleed so pharmacy has been consulted to resume warfarin dosing this admit.   INR today remains therapeutic (INR 2.88 << 2.74, goal of 2-3). No CBC today - pt appears stable. Since INR at higher end of range - will repeat 2 mg today and not increase to 4 mg as per PTA dosing.   PTA dose was 2 mg daily EXCEPT for 4 mg on Mon/Thurs  Goal of Therapy:  INR 2-3   Plan:  1. Warfarin 2 mg x 1 dose at 1800 today 2. Will continue to monitor for any signs/symptoms of bleeding and will follow up with PT/INR in the a.m.   Alycia Rossetti, PharmD, BCPS Clinical Pharmacist Pager: [phone] 09/25/2015 2:24 PM

## 2015-10-02 ENCOUNTER — Ambulatory Visit: Payer: Medicare Other

## 2015-10-02 DIAGNOSIS — R946 Abnormal results of thyroid function studies: Secondary | ICD-10-CM | POA: Diagnosis not present

## 2015-10-02 DIAGNOSIS — I482 Chronic atrial fibrillation: Secondary | ICD-10-CM | POA: Diagnosis not present

## 2015-10-02 DIAGNOSIS — Z7901 Long term (current) use of anticoagulants: Secondary | ICD-10-CM | POA: Diagnosis not present

## 2015-10-02 DIAGNOSIS — I679 Cerebrovascular disease, unspecified: Secondary | ICD-10-CM | POA: Diagnosis not present

## 2015-10-03 ENCOUNTER — Ambulatory Visit: Payer: Medicare Other | Attending: Internal Medicine

## 2015-10-03 DIAGNOSIS — E0590 Thyrotoxicosis, unspecified without thyrotoxic crisis or storm: Secondary | ICD-10-CM | POA: Diagnosis not present

## 2015-10-03 DIAGNOSIS — R42 Dizziness and giddiness: Secondary | ICD-10-CM | POA: Diagnosis not present

## 2015-10-03 DIAGNOSIS — R2689 Other abnormalities of gait and mobility: Secondary | ICD-10-CM | POA: Insufficient documentation

## 2015-10-03 DIAGNOSIS — H8113 Benign paroxysmal vertigo, bilateral: Secondary | ICD-10-CM

## 2015-10-03 NOTE — Therapy (Signed)
[facility] [address] [place], [place], [address] Phone: [phone]   Fax:  [phone]  Physical Therapy Evaluation  Patient Details  Name: [name] MRN: [id number] Date of Birth: 04/22/1925 Referring Provider: Dr. Marzetta Board  Encounter Date: 10/03/2015      PT End of Session - 10/03/15 1557    Visit Number 1   Number of Visits 9   Date for PT Re-Evaluation 11/02/15   Authorization Type G-code and progress note every 10th visit.   PT Start Time 1446   PT Stop Time 1535   PT Time Calculation (min) 49 min   Equipment Utilized During Treatment Gait belt   Activity Tolerance Patient tolerated treatment well   Behavior During Therapy WFL for tasks assessed/performed      Past Medical History  Diagnosis Date  . Pneumonia   . Osteoarthritis   . Hypertension   . Atrial fibrillation ([facility])   . Bradycardia   . Complication of anesthesia   . PONV (postoperative nausea and vomiting)   . Dysrhythmia     A FIB  . Pacemaker   . Shortness of breath     WITH EXERTION  . History of skin cancer   . H/O hiatal hernia   . History of kidney stones   . History of transfusion   . Difficulty sleeping   . Anxiety     Past Surgical History  Procedure Laterality Date  . Microdiskectomy  05/2004    Left L4-L5  . Arthroplasty  1994    Left total knee  . Arthroplasty  2000    Total right knee  . Dg selected hsg gdc only  2002    Dilation  . Appendectomy  1960  . Total abdominal hysterectomy  1969  . Pacemaker insertion    . Joint replacement      knee replacement  . Cholecystectomy    . Back surgery    . Total hip arthroplasty Right 12/24/2013    Procedure: RIGHT TOTAL HIP ARTHROPLASTY ANTERIOR APPROACH;  Surgeon: Gearlean Alf, MD;  Location: [facility];  Service: Orthopedics;  Laterality: Right;    There were no vitals filed for this visit.       Subjective Assessment - 10/03/15 1453    Subjective Pt was hospitalized on 09/23/15 s/p fall. 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 Pt did not feel dizzy prior to fall. Pt was dx'd with R pBPPV while in the hospital and PT treated dizziness. Pt d/c on 09/25/15. Pt has had family staying with pt since hospital stay. Pt has been able to walk in the house and bathe IND. Pt denied falls since hospital d/c. Pt has been using a standard walker during amb. at home. Pt has a rollator and SPC at home.  Pt was IND prior to the fall but would occasionally use SPC as she had a R THA last year and hx of B TKA.  Pt feels dizziness has gotten better since the treatment in the hospital. Dizziness is worse when turning in bed (R>L) and getting up in bed. Pt describes dizziness as spinning and then wooziness afterwards.    Patient is accompained by: Family member  [place] daughter: Tiffany   Pertinent History a-fib with Coumadin, HTN, OA, Hx R THA and B TKA   Patient Stated Goals I want to be able to get out and go and  not feel dizzy.    Currently in Pain? Yes   Pain Score 5    Pain Location Head   Pain Orientation Right;Posterior   Pain Descriptors / Indicators Aching   Pain Type Acute pain   Pain Onset 1 to 4 weeks ago   Pain Frequency Intermittent   Aggravating Factors  nothing   Pain Relieving Factors rest   Multiple Pain Sites Yes   Pain Score 7   Pain Location Sacrum   Pain Orientation Lower   Pain Descriptors / Indicators Aching   Pain Type Acute pain   Pain Onset 1 to 4 weeks ago   Aggravating Factors  sitting directly    Pain Relieving Factors lie on side            [facility] PT Assessment - 10/03/15 1505    Assessment   Medical Diagnosis Dizziness   Referring Provider Dr. Marzetta Board   Onset Date/Surgical Date 09/23/15   Prior Therapy [facility] for TKA and THA   Precautions   Precautions Fall   Restrictions   Weight Bearing Restrictions No   Balance Screen   Has  the patient fallen in the past 6 months Yes   How many times? 1   Has the patient had a decrease in activity level because of a fear of falling?  Yes   Is the patient reluctant to leave their home because of a fear of falling?  Yes   [place] residence   Living Arrangements Alone  but family lives on her land   Available Help at Discharge Family   Type of [place] to enter   Entrance Stairs-Number of Steps 2  and one threshold step to back yard and into [employer] --  handrail in garage but not inside or onto back [place] Two level;Able to live on main level with bedroom/bathroom   Alternate Level Stairs-Number of Steps 12   Alternate Level Stairs-Rails Right   Home Equipment Walker - standard;Walker - 4 wheels;Cane - single point;Grab bars - tub/shower;Shower seat;Wheelchair - [employer]   Prior Function   Level of Independence Independent   Vocation Retired   Biomedical scientist However, 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   Ambulation/Gait   Ambulation/Gait Yes   Ambulation/Gait Assistance 4: Min guard;5: Supervision   Ambulation/Gait Assistance Details No overt LOB but very guarded during amb.   Ambulation Distance (Feet) 75 Feet   Assistive device None   Gait Pattern Step-through pattern;Decreased stride length;Wide base of support  B foot ER   Ambulation Surface Level;Indoor   Gait velocity 2.42ft/sec.,indicating pt is not able to safely amb. In the community.            Vestibular Assessment - 10/03/15 1518    Symptom Behavior   Type of Dizziness Spinning   Frequency of  Dizziness Every day   Duration of Dizziness Less than a minute   Aggravating Factors --  turning in bed and getting up from bed   Relieving Factors Rest  and focusing on object   Occulomotor Exam   Occulomotor Alignment Normal  Spontaneous Absent   Gaze-induced Absent   Head shaking Horizontal Absent   Smooth Pursuits Intact   Saccades Intact   Comment No reports of dizziness.   Vestibulo-Occular Reflex   VOR 1 Head Only (x 1 viewing) WNL.   Positional Testing   Dix-Hallpike Dix-Hallpike Right;Dix-Hallpike Left   Horizontal Canal Testing Horizontal Canal Right;Horizontal Canal Left   Dix-Hallpike Right   Dix-Hallpike Right Duration none   Dix-Hallpike Right Symptoms No nystagmus   Dix-Hallpike Left   Dix-Hallpike Left Duration <15 sec. 10/10 dizziness   Dix-Hallpike Left Symptoms Upbeat, left rotatory nystagmus   Horizontal Canal Right   Horizontal Canal Right Duration none   Horizontal Canal Right Symptoms Normal   Horizontal Canal Left   Horizontal Canal Left Duration Pt reported slight dizziness <5 sec.   Horizontal Canal Left Symptoms Normal                Vestibular Treatment/Exercise - 10/03/15 0001    Vestibular Treatment/Exercise   Vestibular Treatment Provided Canalith Repositioning   Canalith Repositioning Epley Manuever Left    EPLEY MANUEVER LEFT   Number of Reps  2   Overall Response  Improved Symptoms    RESPONSE DETAILS LEFT Pt reported no dizziness after second Epley manuever. Nystagmus amplitude and duration decr. after treatment.                PT Education - 10/03/15 1555    Education provided Yes   Education Details PT discussed duration/frequency and outcome measures. Pt educated pt on BPPV.    Person(s) Educated Patient;Other (comment)  granddaughter-Tiffany   Methods Explanation   Comprehension Verbalized understanding          PT Short Term Goals - 10/03/15 1602    PT SHORT TERM GOAL #1   Title same as LTGs            PT Long Term Goals - 10/03/15 1602    PT LONG TERM GOAL #1   Title Pt will be IND in HEP to improve dizziness and balance. Target date: 10/31/15   Status New   PT LONG TERM GOAL #2   Title Pt will report 0/10 dizziness during B Dix-Hallpike and horizontal canal testing in order to perform bed mobility safely. Target date: 10/31/15   Status New   PT LONG TERM GOAL #3   Title Pt will amb. 500' over even/uneven terrain at MOD I level with LRAD to improve functional mobility. Target date: 10/31/15   Status New   PT LONG TERM GOAL #4   Title Perform DGI and write goal. Target date: 10/31/15   Status New               Plan - 10/03/15 1558    Clinical Impression Statement Pt is a pleasant 82y/o female presenting to [facility] s/p hospitalization for fall and dizziness. During exam pt presented with gait deviations, dizziness, impaired balance, and decr. endurance. Pt's exam findings consistent with L pBPPV, pt also experienced dizziness during L horizontal canal testing but no nystagmus. Pt's hospital notes state pt experienced R pBPPV sx's, so it is likely that multiple canals are involved. PT will formally assess balance next session.   Rehab Potential Good   Clinical Impairments Affecting Rehab Potential hx of vertigo, co-morbidities   PT Frequency 2x / week   PT Duration 4 weeks   PT Treatment/Interventions ADLs/Self Care Home Management;Biofeedback;Canalith Repostioning;Patient/family education;Neuromuscular re-education;Balance training;Therapeutic exercise;Therapeutic activities;Cryotherapy;Functional mobility training;Stair training;Gait training;DME Instruction;Manual techniques;Vestibular  PT Next Visit Plan Re-assess for BPPV (post and horizontal). Perform DGI and provide balance HEP.   Consulted and Agree with Plan of Care Patient;Family member/caregiver   Family Member Consulted granddaughter-Tiffany      Patient will benefit from skilled therapeutic intervention in order to  improve the following deficits and impairments:  Abnormal gait, Decreased endurance, Decreased balance, Dizziness, Decreased mobility, Decreased strength, Pain (PT will not directly address pain but will continue to monitor)  Visit Diagnosis: BPPV (benign paroxysmal positional vertigo), bilateral - Plan: PT plan of care cert/re-cert  Dizziness and giddiness - Plan: PT plan of care cert/re-cert  Other abnormalities of gait and mobility - Plan: PT plan of care cert/re-cert      G-Codes - [id number]    Functional Assessment Tool Used DHI: 58% and 10/10 dizziness during L Dix-Hallpike testing   Functional Limitation Self care   Self Care Current Status [id number]) At least 60 percent but less than 80 percent impaired, limited or restricted   Self Care Goal Status [id number]) At least 1 percent but less than 20 percent impaired, limited or restricted       Problem List Patient Active Problem List   Diagnosis Date Noted  . Fall 09/24/2015  . Dizziness 09/24/2015  . Postoperative anemia due to acute blood loss 12/27/2013  . Postop Transfusion 12/27/2013  . OA (osteoarthritis) of hip 12/24/2013  . Preop cardiovascular exam 09/25/2013  . CARDIAC PACEMAKER IN SITU 08/29/2008  . Essential hypertension 08/21/2008  . ATRIAL FIBRILLATION 08/21/2008  . BRADYCARDIA 08/21/2008  . PNEUMONIA 08/21/2008  . GERD 08/21/2008  . NEPHROLITHIASIS 08/21/2008  . OSTEOARTHRITIS 08/21/2008    Anna Beaird L 10/03/2015, 4:08 PM  [facility] [address] [place], [place], [address] Phone: [phone]   Fax:  [phone]  Name: [name] MRN: [id number] Date of Birth: Jan 26, 1926   Geoffry Paradise, PT,DPT 10/03/2015 4:08 PM Phone: [phone] Fax: [phone]

## 2015-10-07 ENCOUNTER — Ambulatory Visit: Payer: Medicare Other

## 2015-10-07 DIAGNOSIS — R42 Dizziness and giddiness: Secondary | ICD-10-CM | POA: Diagnosis not present

## 2015-10-07 DIAGNOSIS — H8113 Benign paroxysmal vertigo, bilateral: Secondary | ICD-10-CM | POA: Diagnosis not present

## 2015-10-07 DIAGNOSIS — R2689 Other abnormalities of gait and mobility: Secondary | ICD-10-CM

## 2015-10-07 DIAGNOSIS — E0590 Thyrotoxicosis, unspecified without thyrotoxic crisis or storm: Secondary | ICD-10-CM | POA: Diagnosis not present

## 2015-10-07 NOTE — Therapy (Signed)
[facility] [address] [place], [place], [address] Phone: [phone]   Fax:  [phone]  Physical Therapy Treatment  Patient Details  Name: [name] MRN: [id number] Date of Birth: [birth year] Referring Provider: Dr. Marzetta Board  Encounter Date: 10/07/2015      PT End of Session - 10/07/15 1308    Visit Number 2   Number of Visits 9   Date for PT Re-Evaluation 11/02/15   Authorization Type G-code and progress note every 10th visit.   PT Start Time 1103   PT Stop Time 1144   PT Time Calculation (min) 41 min   Activity Tolerance Patient tolerated treatment well   Behavior During Therapy WFL for tasks assessed/performed      Past Medical History  Diagnosis Date  . Pneumonia   . Osteoarthritis   . Hypertension   . Atrial fibrillation ([place])   . Bradycardia   . Complication of anesthesia   . PONV (postoperative nausea and vomiting)   . Dysrhythmia     A FIB  . Pacemaker   . Shortness of breath     WITH EXERTION  . History of skin cancer   . H/O hiatal hernia   . History of kidney stones   . History of transfusion   . Difficulty sleeping   . Anxiety     Past Surgical History  Procedure Laterality Date  . Microdiskectomy  05/2004    Left L4-L5  . Arthroplasty  1994    Left total knee  . Arthroplasty  2000    Total right knee  . Dg selected hsg gdc only  2002    Dilation  . Appendectomy  1960  . Total abdominal hysterectomy  1969  . Pacemaker insertion    . Joint replacement      knee replacement  . Cholecystectomy    . Back surgery    . Total hip arthroplasty Right 12/24/2013    Procedure: RIGHT TOTAL HIP ARTHROPLASTY ANTERIOR APPROACH;  Surgeon: Gearlean Alf, MD;  Location: [facility];  Service: Orthopedics;  Laterality: Right;    There were no vitals filed for this visit.      Subjective Assessment - 10/07/15 1105    Subjective Pt denied falls or changes since last visit. Pt  reported she's not dizzy but has "air or fullness in my head".   Patient is accompained by: Family member  grandson-Tyler   Pertinent History a-fib with Coumadin, HTN, OA, Hx R THA and B TKA   Patient Stated Goals I want to be able to get out and go and not feel dizzy.    Currently in Pain? No/denies                Vestibular Assessment - 10/07/15 1139    Dix-Hallpike Right   Dix-Hallpike Right Duration <10sec. and 5-6/10 dizziness   Dix-Hallpike Right Symptoms Upbeat, right rotatory nystagmus   Dix-Hallpike Left   Dix-Hallpike Left Duration none but pt reported 7/10 dizziness.   Dix-Hallpike Left Symptoms No nystagmus   Horizontal Canal Right   Horizontal Canal Right Duration R rotatory nystagmus and slight dizziness.   Horizontal Canal Right Symptoms Other (comment)  R rotatory nystagmus   Horizontal Canal Left   Horizontal Canal Left Duration None   Horizontal Canal Left Symptoms Normal                 [facility] Adult PT Treatment/Exercise - 10/07/15 1106    Standardized Balance  Assessment   Standardized Balance Assessment Dynamic Gait Index   Dynamic Gait Index   Level Surface Mild Impairment   Change in Gait Speed Moderate Impairment   Gait with Horizontal Head Turns Mild Impairment   Gait with Vertical Head Turns Mild Impairment   Gait and Pivot Turn Mild Impairment   Step Over Obstacle Mild Impairment   Step Around Obstacles Normal   Steps Moderate Impairment   Total Score 15         Vestibular Treatment/Exercise - 10/07/15 0001    Vestibular Treatment/Exercise   Vestibular Treatment Provided Canalith Repositioning;Habituation   Canalith Repositioning Epley Manuever Right;Epley Manuever Left   Habituation Exercises Brandt Daroff    EPLEY MANUEVER RIGHT   Number of Reps  1   Overall Response Symptoms Resolved   Response Details  Pt reported no spinning after treatment.    EPLEY MANUEVER LEFT   Number of Reps  1   Overall Response  Symptoms  Resolved    RESPONSE DETAILS LEFT Pt reported no dizziness after treatment.   Nestor Lewandowsky   Number of Reps  3   Symptom Description  Pt reported lightheadedness/wooziness decreased.               PT Education - 10/07/15 1308    Education provided Yes   Education Details PT provided habituation HEP and discussed multi-canal involvement (BPPV).    Person(s) Educated Patient;Other (comment)  grandson-Tyler   Methods Explanation;Demonstration;Verbal cues;Handout   Comprehension Returned demonstration;Verbalized understanding          PT Short Term Goals - 10/03/15 1602    PT SHORT TERM GOAL #1   Title same as LTGs           PT Long Term Goals - 10/07/15 1310    PT LONG TERM GOAL #1   Title Pt will be IND in HEP to improve dizziness and balance. Target date: 10/31/15   Status On-going   PT LONG TERM GOAL #2   Title Pt will report 0/10 dizziness during B Dix-Hallpike and horizontal canal testing in order to perform bed mobility safely. Target date: 10/31/15   Status On-going   PT LONG TERM GOAL #3   Title Pt will amb. 500' over even/uneven terrain at MOD I level with LRAD to improve functional mobility. Target date: 10/31/15   Status On-going   PT LONG TERM GOAL #4   Title Pt will improve DGI score to >/=20/24 to decr. falls risk.  Target date: 10/31/15   Status Revised               Plan - 10/07/15 1309    Clinical Impression Statement Pt demonstrated progress, as B pBPPV sx's resolved after Epleys treatment. Pt's DGI score of 15/24 indicates pt is at risk for falls. PT observed L foot slap during gait today, which pt reported was present after back surgery years ago. Pt would continue to benefit from skilled PT to improve safety during functional mobility.   Rehab Potential Good   Clinical Impairments Affecting Rehab Potential hx of vertigo, co-morbidities   PT Frequency 2x / week   PT Duration 4 weeks   PT Treatment/Interventions ADLs/Self Care Home  Management;Biofeedback;Canalith Repostioning;Patient/family education;Neuromuscular re-education;Balance training;Therapeutic exercise;Therapeutic activities;Cryotherapy;Functional mobility training;Stair training;Gait training;DME Instruction;Manual techniques;Vestibular   PT Next Visit Plan Re-assess for BPPV (post and horizontal). Provide balance HEP as tolerated.   PT Home Exercise Plan Habituation HEP   Consulted and Agree with Plan of Care Patient;Family member/caregiver  Family Member Consulted grandson-Tyler      Patient will benefit from skilled therapeutic intervention in order to improve the following deficits and impairments:  Abnormal gait, Decreased endurance, Decreased balance, Dizziness, Decreased mobility, Decreased strength, Pain (PT will not directly address pain but will continue to monitor)  Visit Diagnosis: BPPV (benign paroxysmal positional vertigo), bilateral  Dizziness and giddiness  Other abnormalities of gait and mobility     Problem List Patient Active Problem List   Diagnosis Date Noted  . Fall 09/24/2015  . Dizziness 09/24/2015  . Postoperative anemia due to acute blood loss 12/27/2013  . Postop Transfusion 12/27/2013  . OA (osteoarthritis) of hip 12/24/2013  . Preop cardiovascular exam 09/25/2013  . CARDIAC PACEMAKER IN SITU 08/29/2008  . Essential hypertension 08/21/2008  . ATRIAL FIBRILLATION 08/21/2008  . BRADYCARDIA 08/21/2008  . PNEUMONIA 08/21/2008  . GERD 08/21/2008  . NEPHROLITHIASIS 08/21/2008  . OSTEOARTHRITIS 08/21/2008    [name] L 10/07/2015, 1:12 PM  [facility] [address] [place], [place], [address] Phone: [phone]   Fax:  [phone]  Name: LOVINIA WILTSE MRN: [id number] Date of Birth: [birth year]    Geoffry Paradise, PT,DPT 10/07/2015 1:12 PM Phone: [phone] Fax: [phone]

## 2015-10-07 NOTE — Patient Instructions (Signed)
Sit to Side-Lying    Sit on edge of bed. 1. Turn head 45 to right. 2. Maintain head position and lie down slowly on left side. Hold until symptoms subside plus 30 seconds. 3. Sit up slowly. Hold until symptoms subside. 4. Turn head 45 to left. 5. Maintain head position and lie down slowly on right side. Hold until symptoms subside plus 30 secconds.. 6. Sit up slowly. Repeat sequence __3__ times per session. Do _1-2___ sessions per day.  Copyright  VHI. All rights reserved.

## 2015-10-09 ENCOUNTER — Ambulatory Visit: Payer: Medicare Other

## 2015-10-09 DIAGNOSIS — H8113 Benign paroxysmal vertigo, bilateral: Secondary | ICD-10-CM

## 2015-10-09 DIAGNOSIS — R2689 Other abnormalities of gait and mobility: Secondary | ICD-10-CM | POA: Diagnosis not present

## 2015-10-09 DIAGNOSIS — R42 Dizziness and giddiness: Secondary | ICD-10-CM | POA: Diagnosis not present

## 2015-10-09 NOTE — Patient Instructions (Signed)
Perform in corner with chair in front for safety OR at kitchen sink with a chair behind you for safety:  Feet Together (Compliant Surface) Head Motion - Eyes Open    With eyes open, standing on compliant surface: _pillow/cushion_______, feet together, move head slowly: up and down 5 times and up and down 5 times. Repeat __3__ times per session. Do __1__ sessions per day.  Copyright  VHI. All rights reserved.  Feet Together (Compliant Surface) Varied Arm Positions - Eyes Closed    Stand on compliant surface: __pillow/cushion______ with feet together and arms at your side. Close eyes and visualize upright position. Hold__10-30__ seconds. Repeat __3__ times per session. Do __1__ sessions per day.  Copyright  VHI. All rights reserved.

## 2015-10-09 NOTE — Therapy (Signed)
[facility] [address] [place], [place], [address] Phone: [phone]   Fax:  [phone]  Physical Therapy Treatment  Patient Details  Name: [name] MRN: [id number] Date of Birth: 06/23/1925 Referring Provider: Dr. Marzetta Board  Encounter Date: 10/09/2015      PT End of Session - 10/09/15 1201    Visit Number 3   Number of Visits 9   Date for PT Re-Evaluation 11/02/15   Authorization Type G-code and progress note every 10th visit.   PT Start Time 1103   PT Stop Time 1142   PT Time Calculation (min) 39 min   Equipment Utilized During Treatment Gait belt   Activity Tolerance Patient tolerated treatment well   Behavior During Therapy WFL for tasks assessed/performed      Past Medical History  Diagnosis Date  . Pneumonia   . Osteoarthritis   . Hypertension   . Atrial fibrillation ([place])   . Bradycardia   . Complication of anesthesia   . PONV (postoperative nausea and vomiting)   . Dysrhythmia     A FIB  . Pacemaker   . Shortness of breath     WITH EXERTION  . History of skin cancer   . H/O hiatal hernia   . History of kidney stones   . History of transfusion   . Difficulty sleeping   . Anxiety     Past Surgical History  Procedure Laterality Date  . Microdiskectomy  05/2004    Left L4-L5  . Arthroplasty  1994    Left total knee  . Arthroplasty  2000    Total right knee  . Dg selected hsg gdc only  2002    Dilation  . Appendectomy  1960  . Total abdominal hysterectomy  1969  . Pacemaker insertion    . Joint replacement      knee replacement  . Cholecystectomy    . Back surgery    . Total hip arthroplasty Right 12/24/2013    Procedure: RIGHT TOTAL HIP ARTHROPLASTY ANTERIOR APPROACH;  Surgeon: Gearlean Alf, MD;  Location: [facility];  Service: Orthopedics;  Laterality: Right;    There were no vitals filed for this visit.      Subjective Assessment - 10/09/15 1105    Subjective  Pt denied falls since last visit. Pt reports dizziness is better and hasn't noticed dizziness when lying down.   Patient is accompained by: Family member  [name]   Pertinent History a-fib with Coumadin, HTN, OA, Hx R THA and B TKA   Patient Stated Goals I want to be able to get out and go and not feel dizzy.    Currently in Pain? No/denies                Vestibular Assessment - 10/09/15 1107    Dix-Hallpike Right   Dix-Hallpike Right Duration None   Dix-Hallpike Right Symptoms No nystagmus   Dix-Hallpike Left   Dix-Hallpike Left Duration None   Dix-Hallpike Left Symptoms No nystagmus   Horizontal Canal Right   Horizontal Canal Right Duration None   Horizontal Canal Right Symptoms Normal   Horizontal Canal Left   Horizontal Canal Left Duration None   Horizontal Canal Left Symptoms Normal                 [facility] Adult PT Treatment/Exercise - 10/09/15 1205    Ambulation/Gait   Ambulation/Gait Yes   Ambulation/Gait Assistance 4: Min guard;5: Supervision   Ambulation/Gait Assistance  Details Cues for sequencing, pt demonstrated improved balance and speed with SPC vs. no AD. Pt's HR after amb: 60bpm and SaO2 on room air after amb.: 96-97% with pt reporting slight SOB after amb.   Ambulation Distance (Feet) 50 Feet  x2 and 350'   Assistive device Straight cane   Gait Pattern Step-through pattern;Decreased stride length;Wide base of support   Ambulation Surface Level;Indoor             Balance Exercises - 10/09/15 1159    Balance Exercises: Standing   Standing Eyes Opened Narrow base of support (BOS);Wide (BOA);Head turns;Foam/compliant surface;Solid surface;4 reps;10 secs;30 secs   Standing Eyes Closed Narrow base of support (BOS);Wide (BOA);Head turns;Foam/compliant surface;Solid surface;4 reps;30 secs;10 secs   Other Standing Exercises Performed in corner with chair in front of pt for safety, S for safety. Please see pt instrucitons for details re: HEP.            PT Education - 10/09/15 1200    Education provided Yes   Education Details PT discussed B positional testing for BPPV negative and provided pt with balance HEP. PT also asked pt to bring rollator and SPC to next session to assess height of devices and gait with devices.   Person(s) Educated Patient;Other (comment)  [name]   Methods Explanation;Demonstration;Verbal cues;Handout   Comprehension Returned demonstration;Verbalized understanding          PT Short Term Goals - 10/03/15 1602    PT SHORT TERM GOAL #1   Title same as LTGs           PT Long Term Goals - 10/07/15 1310    PT LONG TERM GOAL #1   Title Pt will be IND in HEP to improve dizziness and balance. Target date: 10/31/15   Status On-going   PT LONG TERM GOAL #2   Title Pt will report 0/10 dizziness during B Dix-Hallpike and horizontal canal testing in order to perform bed mobility safely. Target date: 10/31/15   Status On-going   PT LONG TERM GOAL #3   Title Pt will amb. 500' over even/uneven terrain at MOD I level with LRAD to improve functional mobility. Target date: 10/31/15   Status On-going   PT LONG TERM GOAL #4   Title Pt will improve DGI score to >/=20/24 to decr. falls risk.  Target date: 10/31/15   Status Revised               Plan - 10/09/15 1201    Clinical Impression Statement Pt demonstrated progress, as B positional testing negative for sx's and nystagmus indicating BPPV is resolved. Pt continues to experience incr.postural sway and LOB during balance activities which challenge vestibular system (compliant surfaces and eyes closed). Continue with POC.    Rehab Potential Good   Clinical Impairments Affecting Rehab Potential hx of vertigo, co-morbidities   PT Frequency 2x / week   PT Duration 4 weeks   PT Treatment/Interventions ADLs/Self Care Home Management;Biofeedback;Canalith Repostioning;Patient/family education;Neuromuscular re-education;Balance training;Therapeutic  exercise;Therapeutic activities;Cryotherapy;Functional mobility training;Stair training;Gait training;DME Instruction;Manual techniques;Vestibular   PT Next Visit Plan Assess pt's rollator and SPC height and pt's gait with devices. Continue balance activities to challenge vestibular system.    PT Home Exercise Plan Habituation and balance HEP   Consulted and Agree with Plan of Care Patient;Family member/caregiver   Family Member Consulted grandson-Tyler      Patient will benefit from skilled therapeutic intervention in order to improve the following deficits and impairments:  Abnormal gait, Decreased endurance, Decreased  balance, Dizziness, Decreased mobility, Decreased strength, Pain (PT will not directly address pain but will continue to monitor)  Visit Diagnosis: Other abnormalities of gait and mobility  BPPV (benign paroxysmal positional vertigo), bilateral  Dizziness and giddiness     Problem List Patient Active Problem List   Diagnosis Date Noted  . Fall 09/24/2015  . Dizziness 09/24/2015  . Postoperative anemia due to acute blood loss 12/27/2013  . Postop Transfusion 12/27/2013  . OA (osteoarthritis) of hip 12/24/2013  . Preop cardiovascular exam 09/25/2013  . CARDIAC PACEMAKER IN SITU 08/29/2008  . Essential hypertension 08/21/2008  . ATRIAL FIBRILLATION 08/21/2008  . BRADYCARDIA 08/21/2008  . PNEUMONIA 08/21/2008  . GERD 08/21/2008  . NEPHROLITHIASIS 08/21/2008  . OSTEOARTHRITIS 08/21/2008    Earlene Bjelland L 10/09/2015, 12:06 PM  [facility] [address] [place], [place], [address] Phone: [phone]   Fax:  [phone]  Name: NOVALIE KRONBERG MRN: [id number] Date of Birth: [birth year]    Geoffry Paradise, PT,DPT 10/09/2015 12:06 PM Phone: [phone] Fax: [phone]

## 2015-10-14 ENCOUNTER — Ambulatory Visit: Payer: Medicare Other

## 2015-10-14 DIAGNOSIS — R42 Dizziness and giddiness: Secondary | ICD-10-CM

## 2015-10-14 DIAGNOSIS — R2689 Other abnormalities of gait and mobility: Secondary | ICD-10-CM

## 2015-10-14 DIAGNOSIS — H8113 Benign paroxysmal vertigo, bilateral: Secondary | ICD-10-CM | POA: Diagnosis not present

## 2015-10-15 ENCOUNTER — Other Ambulatory Visit (HOSPITAL_COMMUNITY): Payer: Self-pay | Admitting: Internal Medicine

## 2015-10-15 DIAGNOSIS — Z7901 Long term (current) use of anticoagulants: Secondary | ICD-10-CM | POA: Diagnosis not present

## 2015-10-15 DIAGNOSIS — I482 Chronic atrial fibrillation: Secondary | ICD-10-CM | POA: Diagnosis not present

## 2015-10-15 DIAGNOSIS — E0590 Thyrotoxicosis, unspecified without thyrotoxic crisis or storm: Secondary | ICD-10-CM

## 2015-10-15 NOTE — Therapy (Signed)
[facility] [address] [place], [place], [address] Phone: [phone]   Fax:  [phone]  Physical Therapy Treatment  Patient Details  Name: [name] MRN: [id number] Date of Birth: [birth year] Referring Provider: Dr. Marzetta Board  Encounter Date: 10/14/2015      PT End of Session - 10/15/15 1237    Visit Number 4   Number of Visits 9   Date for PT Re-Evaluation 11/02/15   Authorization Type G-code and progress note every 10th visit.   PT Start Time 1448   PT Stop Time 1529   PT Time Calculation (min) 41 min   Equipment Utilized During Treatment Gait belt   Activity Tolerance Patient tolerated treatment well   Behavior During Therapy WFL for tasks assessed/performed      Past Medical History  Diagnosis Date  . Pneumonia   . Osteoarthritis   . Hypertension   . Atrial fibrillation ([place])   . Bradycardia   . Complication of anesthesia   . PONV (postoperative nausea and vomiting)   . Dysrhythmia     A FIB  . Pacemaker   . Shortness of breath     WITH EXERTION  . History of skin cancer   . H/O hiatal hernia   . History of kidney stones   . History of transfusion   . Difficulty sleeping   . Anxiety     Past Surgical History  Procedure Laterality Date  . Microdiskectomy  05/2004    Left L4-L5  . Arthroplasty  1994    Left total knee  . Arthroplasty  2000    Total right knee  . Dg selected hsg gdc only  2002    Dilation  . Appendectomy  1960  . Total abdominal hysterectomy  1969  . Pacemaker insertion    . Joint replacement      knee replacement  . Cholecystectomy    . Back surgery    . Total hip arthroplasty Right 12/24/2013    Procedure: RIGHT TOTAL HIP ARTHROPLASTY ANTERIOR APPROACH;  Surgeon: Gearlean Alf, MD;  Location: [facility];  Service: Orthopedics;  Laterality: Right;    There were no vitals filed for this visit.      Subjective Assessment - 10/14/15 1454    Subjective  Pt denied falls since last visit. pt reported HEP is going well. Pt reported no dizziness this week. Pt amb. into PT session and did not use w/c.   Patient is accompained by: Family member  grandson-Matthew   Pertinent History a-fib with Coumadin, HTN, OA, Hx R THA and B TKA   Patient Stated Goals I want to be able to get out and go and not feel dizzy.    Currently in Pain? No/denies                         [facility] Adult PT Treatment/Exercise - 10/14/15 1455    Ambulation/Gait   Ambulation/Gait Yes   Ambulation/Gait Assistance 5: Supervision;4: Min guard   Ambulation/Gait Assistance Details Pt amb. with standard RW, rollator and SPC with pt progressing from min guard to S for safety.  Pt required seated rest break 2/2 SOB. HR: 71-74bpm and SaO2 on room air: 97-98%. BP: 145/67.    Ambulation Distance (Feet) 40 Feet  x2, 71' with standard walker, 200' c SPC 200' and rollator   Assistive device Straight cane;Standard walker;Rollator   Gait Pattern Step-through pattern;Decreased stride length;Wide base of support  Ambulation Surface Indoor;Level   Gait velocity 2.1ft/sec.  with SPC, 2.34ft/sec c standard walker   Gait Comments Pt amb. the following above distances inside and amb. 250' outdoors with [facility]. Cues to improve heel strike and stride length. No overt LOB.                PT Education - 10/15/15 1236    Education provided Yes   Education Details PT discussed practicing amb. with SPC in long hallway at home, but to continue to use walker to traverse step down into living room. Pt agreeable. Pt's SPC and walker height safe for pt to use AD's at home.    Person(s) Educated Patient;Other (comment)  grandson-Matthew   Methods Explanation   Comprehension Verbalized understanding          PT Short Term Goals - 10/03/15 1602    PT SHORT TERM GOAL #1   Title same as LTGs           PT Long Term Goals - 10/07/15 1310    PT LONG TERM GOAL #1   Title Pt  will be IND in HEP to improve dizziness and balance. Target date: 10/31/15   Status On-going   PT LONG TERM GOAL #2   Title Pt will report 0/10 dizziness during B Dix-Hallpike and horizontal canal testing in order to perform bed mobility safely. Target date: 10/31/15   Status On-going   PT LONG TERM GOAL #3   Title Pt will amb. 500' over even/uneven terrain at MOD I level with LRAD to improve functional mobility. Target date: 10/31/15   Status On-going   PT LONG TERM GOAL #4   Title Pt will improve DGI score to >/=20/24 to decr. falls risk.  Target date: 10/31/15   Status Revised               Plan - 10/15/15 1237    Clinical Impression Statement Pt continues to demonstrate progress, as she was able to amb. longer distances (over even/uneven terrain) with rollator and SPC without LOB. Pt limited by decr. endurance, as she required seated rest breaks during session. Pt's vitals WNL. Pt also able to amb. >2.77ft/sec with SPC, indicating she could safely amb. In the community. Additionally, pt has not experienced dizziness since last week indicating BPPV has resolved. Pt would continue to benefit from skilled PT to improve balance and safely during functional mobility.    Rehab Potential Good   Clinical Impairments Affecting Rehab Potential hx of vertigo, co-morbidities   PT Frequency 2x / week   PT Duration 4 weeks   PT Treatment/Interventions ADLs/Self Care Home Management;Biofeedback;Canalith Repostioning;Patient/family education;Neuromuscular re-education;Balance training;Therapeutic exercise;Therapeutic activities;Cryotherapy;Functional mobility training;Stair training;Gait training;DME Instruction;Manual techniques;Vestibular   PT Next Visit Plan Continue balance and gait activities to challenge vestibular system.    PT Home Exercise Plan Habituation and balance HEP   Consulted and Agree with Plan of Care Patient;Family member/caregiver   Family Member Consulted grandson-Tyler       Patient will benefit from skilled therapeutic intervention in order to improve the following deficits and impairments:  Abnormal gait, Decreased endurance, Decreased balance, Dizziness, Decreased mobility, Decreased strength, Pain (PT will not directly address pain but will continue to monitor)  Visit Diagnosis: Other abnormalities of gait and mobility  Dizziness and giddiness     Problem List Patient Active Problem List   Diagnosis Date Noted  . Fall 09/24/2015  . Dizziness 09/24/2015  . Postoperative anemia due to acute blood loss 12/27/2013  .  Postop Transfusion 12/27/2013  . OA (osteoarthritis) of hip 12/24/2013  . Preop cardiovascular exam 09/25/2013  . CARDIAC PACEMAKER IN SITU 08/29/2008  . Essential hypertension 08/21/2008  . ATRIAL FIBRILLATION 08/21/2008  . BRADYCARDIA 08/21/2008  . PNEUMONIA 08/21/2008  . GERD 08/21/2008  . NEPHROLITHIASIS 08/21/2008  . OSTEOARTHRITIS 08/21/2008    Kamera Dubas L 10/15/2015, 12:40 PM  [facility] [address] [place], [place], [address] Phone: [phone]   Fax:  [phone]  Name: KORINN HALUSKA MRN: [id number] Date of Birth: February 23, 1926    Geoffry Paradise, PT,DPT 10/15/2015 12:40 PM Phone: [phone] Fax: [phone]

## 2015-10-16 ENCOUNTER — Ambulatory Visit: Payer: Medicare Other

## 2015-10-16 DIAGNOSIS — H8113 Benign paroxysmal vertigo, bilateral: Secondary | ICD-10-CM | POA: Diagnosis not present

## 2015-10-16 DIAGNOSIS — R42 Dizziness and giddiness: Secondary | ICD-10-CM | POA: Diagnosis not present

## 2015-10-16 DIAGNOSIS — R2689 Other abnormalities of gait and mobility: Secondary | ICD-10-CM

## 2015-10-16 NOTE — Therapy (Signed)
[facility] [address] [place], [place], [address] Phone: [phone]   Fax:  [phone]  Physical Therapy Treatment  Patient Details  Name: [name] MRN: [id number] Date of Birth: May 09, 1925 Referring Provider: Dr. Marzetta Board  Encounter Date: 10/16/2015      PT End of Session - 10/16/15 1616    Visit Number 5   Number of Visits 9   Date for PT Re-Evaluation 11/02/15   Authorization Type G-code and progress note every 10th visit.   PT Start Time 1531   PT Stop Time 1611   PT Time Calculation (min) 40 min   Equipment Utilized During Treatment Gait belt   Activity Tolerance Patient tolerated treatment well   Behavior During Therapy WFL for tasks assessed/performed      Past Medical History  Diagnosis Date  . Pneumonia   . Osteoarthritis   . Hypertension   . Atrial fibrillation ([place])   . Bradycardia   . Complication of anesthesia   . PONV (postoperative nausea and vomiting)   . Dysrhythmia     A FIB  . Pacemaker   . Shortness of breath     WITH EXERTION  . History of skin cancer   . H/O hiatal hernia   . History of kidney stones   . History of transfusion   . Difficulty sleeping   . Anxiety     Past Surgical History  Procedure Laterality Date  . Microdiskectomy  05/2004    Left L4-L5  . Arthroplasty  1994    Left total knee  . Arthroplasty  2000    Total right knee  . Dg selected hsg gdc only  2002    Dilation  . Appendectomy  1960  . Total abdominal hysterectomy  1969  . Pacemaker insertion    . Joint replacement      knee replacement  . Cholecystectomy    . Back surgery    . Total hip arthroplasty Right 12/24/2013    Procedure: RIGHT TOTAL HIP ARTHROPLASTY ANTERIOR APPROACH;  Surgeon: Gearlean Alf, MD;  Location: [facility];  Service: Orthopedics;  Laterality: Right;    There were no vitals filed for this visit.      Subjective Assessment - 10/16/15 1535    Subjective  Pt denied falls since last visit. Pt reported her head still occasionally hurts.    Patient is accompained by: Family member  grandson-Matthew   Pertinent History a-fib with Coumadin, HTN, OA, Hx R THA and B TKA   Patient Stated Goals I want to be able to get out and go and not feel dizzy.    Currently in Pain? No/denies                         [facility] Adult PT Treatment/Exercise - 10/16/15 1536    High Level Balance   High Level Balance Activities Side stepping;Braiding;Backward walking;Head turns;Marching forwards   High Level Balance Comments Performed over compliant surfaces 4x15'/activity. Cues for technique with min guard to min A to maintain balance.             Balance Exercises - 10/16/15 1558    Balance Exercises: Standing   Other Standing Exercises Performed single, double and knock over/turn upright cone taps 2x5 cones/LE. Cues to improve lat. wt. shifting with min guard to min-mod A to maintain balance. Pt required seated rest break 2/2 fatigue.  Performed in // bars on foam with  min guard to min A (during 2 LOB episodes), pt performed B LE marches and ball toss to herself and then tossed ball back and forth with grandson in all directions x50 reps. Cues for technique and to maintain CoG over midline vs. ant.           PT Education - 10/16/15 1618    Education provided Yes   Education Details PT educated pt on pursed lip breathing and encouraged pt to peform during SOB and to notify MD of SOB.   Person(s) Educated Patient;Other (comment)  grandson-Matthew   Methods Explanation;Demonstration   Comprehension Verbalized understanding;Returned demonstration;Need further instruction          PT Short Term Goals - 10/03/15 1602    PT SHORT TERM GOAL #1   Title same as LTGs           PT Long Term Goals - 10/07/15 1310    PT LONG TERM GOAL #1   Title Pt will be IND in HEP to improve dizziness and balance. Target date: 10/31/15   Status On-going    PT LONG TERM GOAL #2   Title Pt will report 0/10 dizziness during B Dix-Hallpike and horizontal canal testing in order to perform bed mobility safely. Target date: 10/31/15   Status On-going   PT LONG TERM GOAL #3   Title Pt will amb. 500' over even/uneven terrain at MOD I level with LRAD to improve functional mobility. Target date: 10/31/15   Status On-going   PT LONG TERM GOAL #4   Title Pt will improve DGI score to >/=20/24 to decr. falls risk.  Target date: 10/31/15   Status Revised               Plan - 10/16/15 1617    Clinical Impression Statement Pt demonstrated progress, as she was able to perform high level balance activities on compliant surfaces. Pt did require min-mod A to maintain balance during LOB episodes. Pt's SaO2 on room air at end of session was 89% and quickly improved to >94% with pursed lip breathing. Continue with POC.    Rehab Potential Good   Clinical Impairments Affecting Rehab Potential hx of vertigo, co-morbidities   PT Frequency 2x / week   PT Duration 4 weeks   PT Treatment/Interventions ADLs/Self Care Home Management;Biofeedback;Canalith Repostioning;Patient/family education;Neuromuscular re-education;Balance training;Therapeutic exercise;Therapeutic activities;Cryotherapy;Functional mobility training;Stair training;Gait training;DME Instruction;Manual techniques;Vestibular   PT Next Visit Plan Monitor O2. Continue balance and gait activities to challenge vestibular system.    PT Home Exercise Plan Habituation and balance HEP   Consulted and Agree with Plan of Care Patient;Family member/caregiver   Family Member Consulted grandson-Tyler      Patient will benefit from skilled therapeutic intervention in order to improve the following deficits and impairments:  Abnormal gait, Decreased endurance, Decreased balance, Dizziness, Decreased mobility, Decreased strength, Pain (PT will not directly address pain but will continue to monitor)  Visit  Diagnosis: Other abnormalities of gait and mobility     Problem List Patient Active Problem List   Diagnosis Date Noted  . Fall 09/24/2015  . Dizziness 09/24/2015  . Postoperative anemia due to acute blood loss 12/27/2013  . Postop Transfusion 12/27/2013  . OA (osteoarthritis) of hip 12/24/2013  . Preop cardiovascular exam 09/25/2013  . CARDIAC PACEMAKER IN SITU 08/29/2008  . Essential hypertension 08/21/2008  . ATRIAL FIBRILLATION 08/21/2008  . BRADYCARDIA 08/21/2008  . PNEUMONIA 08/21/2008  . GERD 08/21/2008  . NEPHROLITHIASIS 08/21/2008  . OSTEOARTHRITIS 08/21/2008  [name] 10/16/2015, 4:19 PM  [facility] [address] [place], [place], [address] Phone: [phone]   Fax:  [phone]  Name: [name] MRN: [id number] Date of Birth: [birth year]    Geoffry Paradise, PT,DPT 10/16/2015 4:19 PM Phone: [phone] Fax: [phone]

## 2015-10-21 ENCOUNTER — Ambulatory Visit: Payer: Medicare Other

## 2015-10-21 DIAGNOSIS — R2689 Other abnormalities of gait and mobility: Secondary | ICD-10-CM | POA: Diagnosis not present

## 2015-10-21 DIAGNOSIS — R42 Dizziness and giddiness: Secondary | ICD-10-CM | POA: Diagnosis not present

## 2015-10-21 DIAGNOSIS — H8113 Benign paroxysmal vertigo, bilateral: Secondary | ICD-10-CM | POA: Diagnosis not present

## 2015-10-21 NOTE — Therapy (Signed)
[facility] [address] [place], [place], [address] Phone: [phone]   Fax:  [phone]  Physical Therapy Treatment  Patient Details  Name: [name] MRN: [id number] Date of Birth: 03-01-1926 Referring Provider: Dr. Marzetta Board  Encounter Date: 10/21/2015      PT End of Session - 10/21/15 1536    Visit Number 6   Number of Visits 9   Date for PT Re-Evaluation 11/02/15   Authorization Type G-code and progress note every 10th visit.   PT Start Time 1100   PT Stop Time 1125  PT had to leave early   PT Time Calculation (min) 25 min   Equipment Utilized During Treatment Gait belt   Activity Tolerance Patient tolerated treatment well   Behavior During Therapy WFL for tasks assessed/performed      Past Medical History:  Diagnosis Date  . Anxiety   . Atrial fibrillation ([place])   . Bradycardia   . Complication of anesthesia   . Difficulty sleeping   . Dysrhythmia    A FIB  . H/O hiatal hernia   . History of kidney stones   . History of skin cancer   . History of transfusion   . Hypertension   . Osteoarthritis   . Pacemaker   . Pneumonia   . PONV (postoperative nausea and vomiting)   . Shortness of breath    WITH EXERTION    Past Surgical History:  Procedure Laterality Date  . APPENDECTOMY  1960  . ARTHROPLASTY  1994   Left total knee  . ARTHROPLASTY  2000   Total right knee  . BACK SURGERY    . CHOLECYSTECTOMY    . DG SELECTED HSG GDC ONLY  2002   Dilation  . JOINT REPLACEMENT     knee replacement  . Microdiskectomy  05/2004   Left L4-L5  . PACEMAKER INSERTION    . TOTAL ABDOMINAL HYSTERECTOMY  1969  . TOTAL HIP ARTHROPLASTY Right 12/24/2013   Procedure: RIGHT TOTAL HIP ARTHROPLASTY ANTERIOR APPROACH;  Surgeon: Gearlean Alf, MD;  Location: [facility];  Service: Orthopedics;  Laterality: Right;    There were no vitals filed for this visit.      Subjective Assessment - 10/21/15  1105    Subjective Pt denied falls or changes since last visit. pt reported she's having thyroid tests Thurs and Friday and had to cancel Thursday appt. Pt amb. to session with SPC.   Pertinent History a-fib with Coumadin, HTN, OA, Hx R THA and B TKA   Patient Stated Goals I want to be able to get out and go and not feel dizzy.    Currently in Pain? No/denies                              Balance Exercises - 10/21/15 1106      Balance Exercises: Standing   Rockerboard Anterior/posterior;Lateral;Head turns;EO;10 seconds;30 seconds;EC;10 reps;Intermittent UE support;Other reps (comment)  20 reps   Other Standing Exercises Performed in // bars with min guard to min A to maintain balance, cues for technique. Pt also performed ball toss/catch with grandson, while PT guarded pt (with pt standing on rockerboard) x50 reps.            PT Education - 10/21/15 1535    Education provided Yes   Education Details PT discussed goals will be assessed next week and PT will likely d/c based on  pt's progress, and will renew if needed. PT also encouraged pt to ask MD about driving again, as pt asked if she's safe to drive. PT educated pt on the importance on not driving if she is experiencing dizziness.   Person(s) Educated Patient;Other (comment)  pt's grandson-Tyler   Methods Explanation   Comprehension Verbalized understanding          PT Short Term Goals - 10/03/15 1602      PT SHORT TERM GOAL #1   Title same as LTGs           PT Long Term Goals - 10/07/15 1310      PT LONG TERM GOAL #1   Title Pt will be IND in HEP to improve dizziness and balance. Target date: 10/31/15   Status On-going     PT LONG TERM GOAL #2   Title Pt will report 0/10 dizziness during B Dix-Hallpike and horizontal canal testing in order to perform bed mobility safely. Target date: 10/31/15   Status On-going     PT LONG TERM GOAL #3   Title Pt will amb. 500' over even/uneven terrain at MOD I  level with LRAD to improve functional mobility. Target date: 10/31/15   Status On-going     PT LONG TERM GOAL #4   Title Pt will improve DGI score to >/=20/24 to decr. falls risk.  Target date: 10/31/15   Status Revised               Plan - 10/21/15 1537    Clinical Impression Statement Pt demonstrated progress, as she did not require any rest breaks for 25 consecutive minutes of treatment. Pt also experienced less LOB episodes today during high level balance activities which challenged vestibular system. Continue with POC.    Rehab Potential Good   Clinical Impairments Affecting Rehab Potential hx of vertigo, co-morbidities   PT Frequency 2x / week   PT Duration 4 weeks   PT Next Visit Plan Monitor O2. Review HEP and progress as tolerated.   PT Home Exercise Plan Habituation and balance HEP   Consulted and Agree with Plan of Care Patient;Family member/caregiver   Family Member Consulted grandson-Tyler      Patient will benefit from skilled therapeutic intervention in order to improve the following deficits and impairments:  Abnormal gait, Decreased endurance, Decreased balance, Dizziness, Decreased mobility, Decreased strength, Pain  Visit Diagnosis: Dizziness and giddiness  Other abnormalities of gait and mobility     Problem List Patient Active Problem List   Diagnosis Date Noted  . Fall 09/24/2015  . Dizziness 09/24/2015  . Postoperative anemia due to acute blood loss 12/27/2013  . Postop Transfusion 12/27/2013  . OA (osteoarthritis) of hip 12/24/2013  . Preop cardiovascular exam 09/25/2013  . CARDIAC PACEMAKER IN SITU 08/29/2008  . Essential hypertension 08/21/2008  . ATRIAL FIBRILLATION 08/21/2008  . BRADYCARDIA 08/21/2008  . PNEUMONIA 08/21/2008  . GERD 08/21/2008  . NEPHROLITHIASIS 08/21/2008  . OSTEOARTHRITIS 08/21/2008    Iasha Mccalister L 10/21/2015, 3:39 PM  [facility] [address] [place], [place], [address] Phone: [phone]   Fax:  [phone]  Name: SHAYMA SCHWIESOW MRN: [id number] Date of Birth: 1926-02-19   Geoffry Paradise, PT,DPT 10/21/15 3:39 PM Phone: [phone] Fax: [phone]

## 2015-10-23 ENCOUNTER — Ambulatory Visit (HOSPITAL_COMMUNITY)
Admission: RE | Admit: 2015-10-23 | Discharge: 2015-10-23 | Disposition: A | Discharge status: Home / Self Care | Payer: Medicare Other | Source: Ambulatory Visit | Attending: Internal Medicine | Admitting: Internal Medicine

## 2015-10-23 DIAGNOSIS — E0520 Thyrotoxicosis with toxic multinodular goiter without thyrotoxic crisis or storm: Secondary | ICD-10-CM | POA: Insufficient documentation

## 2015-10-23 DIAGNOSIS — E0590 Thyrotoxicosis, unspecified without thyrotoxic crisis or storm: Secondary | ICD-10-CM

## 2015-10-24 ENCOUNTER — Encounter (HOSPITAL_COMMUNITY)
Admission: RE | Admit: 2015-10-24 | Discharge: 2015-10-24 | Disposition: A | Discharge status: Home / Self Care | Payer: Medicare Other | Source: Ambulatory Visit | Attending: Internal Medicine | Admitting: Internal Medicine

## 2015-10-24 DIAGNOSIS — E0520 Thyrotoxicosis with toxic multinodular goiter without thyrotoxic crisis or storm: Secondary | ICD-10-CM | POA: Diagnosis not present

## 2015-10-24 DIAGNOSIS — E0590 Thyrotoxicosis, unspecified without thyrotoxic crisis or storm: Secondary | ICD-10-CM | POA: Diagnosis not present

## 2015-10-24 MED ORDER — SODIUM PERTECHNETATE TC 99M INJECTION
10.0000 | Freq: Once | INTRAVENOUS | Status: AC | PRN
  Administered 2015-10-24: 10 via INTRAVENOUS

## 2015-10-24 MED ORDER — SODIUM IODIDE I 131 CAPSULE
6.2000 | Freq: Once | INTRAVENOUS | Status: AC | PRN
  Administered 2015-10-23: 6.2 via ORAL

## 2015-10-28 ENCOUNTER — Ambulatory Visit: Payer: Medicare Other | Attending: Internal Medicine

## 2015-10-28 DIAGNOSIS — R2689 Other abnormalities of gait and mobility: Secondary | ICD-10-CM | POA: Insufficient documentation

## 2015-10-28 DIAGNOSIS — R42 Dizziness and giddiness: Secondary | ICD-10-CM | POA: Insufficient documentation

## 2015-10-28 DIAGNOSIS — H8113 Benign paroxysmal vertigo, bilateral: Secondary | ICD-10-CM | POA: Diagnosis not present

## 2015-10-28 NOTE — Therapy (Signed)
[facility] [address] [place], [place], [address] Phone: [phone]   Fax:  [phone]  Physical Therapy Treatment  Patient Details  Name: [name] MRN: [id number] Date of Birth: February 05, 1926 Referring Provider: Dr. Marzetta Board  Encounter Date: 10/28/2015      PT End of Session - 10/28/15 1309    Visit Number 7   Number of Visits 9   Date for PT Re-Evaluation 11/02/15   Authorization Type G-code and progress note every 10th visit.   PT Start Time 1102   PT Stop Time 1140   PT Time Calculation (min) 38 min   Equipment Utilized During Treatment Gait belt   Activity Tolerance Patient tolerated treatment well   Behavior During Therapy WFL for tasks assessed/performed      Past Medical History:  Diagnosis Date  . Anxiety   . Atrial fibrillation ([place])   . Bradycardia   . Complication of anesthesia   . Difficulty sleeping   . Dysrhythmia    A FIB  . H/O hiatal hernia   . History of kidney stones   . History of skin cancer   . History of transfusion   . Hypertension   . Osteoarthritis   . Pacemaker   . Pneumonia   . PONV (postoperative nausea and vomiting)   . Shortness of breath    WITH EXERTION    Past Surgical History:  Procedure Laterality Date  . APPENDECTOMY  1960  . ARTHROPLASTY  1994   Left total knee  . ARTHROPLASTY  2000   Total right knee  . BACK SURGERY    . CHOLECYSTECTOMY    . DG SELECTED HSG GDC ONLY  2002   Dilation  . JOINT REPLACEMENT     knee replacement  . Microdiskectomy  05/2004   Left L4-L5  . PACEMAKER INSERTION    . TOTAL ABDOMINAL HYSTERECTOMY  1969  . TOTAL HIP ARTHROPLASTY Right 12/24/2013   Procedure: RIGHT TOTAL HIP ARTHROPLASTY ANTERIOR APPROACH;  Surgeon: Gearlean Alf, MD;  Location: [facility];  Service: Orthopedics;  Laterality: Right;    There were no vitals filed for this visit.      Subjective Assessment - 10/28/15 1104    Subjective Pt  denied falls or changes since last visit. Pt reported she has not heard back about thyroid tests last week.    Patient is accompained by: Family member  son-Gary   Pertinent History a-fib with Coumadin, HTN, OA, Hx R THA and B TKA   Patient Stated Goals I want to be able to get out and go and not feel dizzy.    Currently in Pain? No/denies        Neuro re-ed: Pt performed previous and new balance HEP with S for safety and cues for technique. Pt reported she hasn't performed every day. Please see pt instructions for details. Pt's HR during session at rest: 61-3bpm and SaO2 at rest on room air: 96% HR after activity: 67-75bpm SaO2 on room air after activity: 95-97%                 [facility] Adult PT Treatment/Exercise - 10/28/15 1136      Ambulation/Gait   Ambulation/Gait Yes   Ambulation/Gait Assistance 5: Supervision   Ambulation/Gait Assistance Details Pt amb. while performing head turns without LOB, but decr. gait speed.   Ambulation Distance (Feet) 300 Feet   Assistive device Straight cane   Gait Pattern Step-through pattern;Decreased stride length;Wide base  of support   Ambulation Surface Level;Indoor     High Level Balance   High Level Balance Activities Head turns;Backward walking;Other (comment)  forward c and s eyes closed   High Level Balance Comments Performed over compliant surfaces (red and blue mats) 4-6x15'/activity. Cues for technique with min guard for safety.                PT Education - 10/28/15 1308    Education provided Yes   Education Details PT discussed the importance of always using SPC for safety. PT reviewed and progress balance HEP prn.   Person(s) Educated Patient;Child(ren)  son-Gary   Methods Explanation;Demonstration;Verbal cues;Handout   Comprehension Verbalized understanding;Returned demonstration          PT Short Term Goals - 10/03/15 1602      PT SHORT TERM GOAL #1   Title same as LTGs           PT Long Term  Goals - 10/28/15 1311      PT LONG TERM GOAL #1   Title Pt will be IND in HEP to improve dizziness and balance. Target date: 10/31/15   Status Partially Met     PT LONG TERM GOAL #2   Title Pt will report 0/10 dizziness during B Dix-Hallpike and horizontal canal testing in order to perform bed mobility safely. Target date: 10/31/15   Status On-going     PT LONG TERM GOAL #3   Title Pt will amb. 500' over even/uneven terrain at MOD I level with LRAD to improve functional mobility. Target date: 10/31/15   Status On-going     PT LONG TERM GOAL #4   Title Pt will improve DGI score to >/=20/24 to decr. falls risk.  Target date: 10/31/15   Status Revised               Plan - 10/28/15 1309    Clinical Impression Statement Pt demonstrated progress, as she was able to tolerate progressed balance HEP to challenge vestibular system. Pt continues to report SOB after activity, but SaO2 on room air remained >95% during session, therefore, PT encouraged pt to notify MD (cardiologist if possible) as it could be pulmonary or cardiac related vs. decr. endurance from hospitalization. Continue with POC.    Rehab Potential Good   Clinical Impairments Affecting Rehab Potential hx of vertigo, co-morbidities   PT Frequency 2x / week   PT Duration 4 weeks   PT Next Visit Plan Check goals and d/c. [facility])   PT Home Exercise Plan Habituation and balance HEP   Consulted and Agree with Plan of Care Patient;Family member/caregiver   Family Member Consulted son-Gary      Patient will benefit from skilled therapeutic intervention in order to improve the following deficits and impairments:  Abnormal gait, Decreased endurance, Decreased balance, Dizziness, Decreased mobility, Decreased strength, Pain  Visit Diagnosis: Other abnormalities of gait and mobility  Dizziness and giddiness     Problem List Patient Active Problem List   Diagnosis Date Noted  . Fall 09/24/2015  . Dizziness 09/24/2015  .  Postoperative anemia due to acute blood loss 12/27/2013  . Postop Transfusion 12/27/2013  . OA (osteoarthritis) of hip 12/24/2013  . Preop cardiovascular exam 09/25/2013  . CARDIAC PACEMAKER IN SITU 08/29/2008  . Essential hypertension 08/21/2008  . ATRIAL FIBRILLATION 08/21/2008  . BRADYCARDIA 08/21/2008  . PNEUMONIA 08/21/2008  . GERD 08/21/2008  . NEPHROLITHIASIS 08/21/2008  . OSTEOARTHRITIS 08/21/2008    Carolie Mcilrath L 10/28/2015, 1:12 PM  [facility] [address] [place], [place], [address] Phone: [phone]   Fax:  [phone]  Name: [name] MRN: [id number] Date of Birth: 1925/10/19   Geoffry Paradise, PT,DPT 10/28/15 1:12 PM Phone: [phone] Fax: [phone]

## 2015-10-28 NOTE — Patient Instructions (Addendum)
Perform in corner with chair in front for safety OR at kitchen sink with a chair behind you for safety:  Feet Together (Compliant Surface) Head Motion - Eyes Open    With eyes open, standing on compliant surface: _pillow/cushion_______, feet together (as close as you can get feet together if possible), move head slowly: up and down 5 times and up and down 5 times. Repeat __3__ times per session. Do __1__ sessions per day.  Copyright  VHI. All rights reserved.  Feet Together (Compliant Surface) Varied Arm Positions - Eyes Closed    Stand on compliant surface: __pillow/cushion______ with feet together and arms at your side. Close eyes and visualize upright position. Hold__10-30__ seconds. Repeat __3__ times per session. Do __1__ sessions per day.  Copyright  VHI. All rights reserved.   Up / Down Head Motion    Perform with one hand on counter. Walking on solid surface, move head and eyes toward ceiling for _2___ steps.  Then, move head and eyes toward floor for __2__ steps. Repeat __4__ times per session. Do __1__ sessions per day.   Copyright  VHI. All rights reserved.  Side to Side Head Motion    Perform with one hand on counter. Walking on solid surface, turn head and eyes to left for __2__ steps.  Then, turn head and eyes to opposite side for __2__ steps. Repeat sequence __4__ times per session. Do __1__ sessions per day.   Copyright  VHI. All rights reserved.

## 2015-10-29 ENCOUNTER — Telehealth: Payer: Self-pay | Admitting: Internal Medicine

## 2015-10-29 NOTE — Telephone Encounter (Signed)
Patient called to report worsening SOB for several weeks.  She fell in late June and started Rehab. Her nurse at rehab told her she needed to see her cardiologist because of her SOB, which started prior to fall. She states she has complained to Dr. Osborne Casco about the SOB but "he hasn't done anything." She reports he did lab work and is starting her on thyroid medication. She has not yet picked up the pills and she is unsure of medication or dosage. Patient is not audibly SOB on the phone and she states she is not in distress. Instructed patient to get medication ASAP, that abnormal thyroid levels can also cause SOB. Patient will try medication for a few days and call with medication to add to med list and an update on how she is feeling. She understands to call if symptoms worsen in the meantime.   Per patient request, sending to [facility]. She is worried something may be wrong with her pacemaker causing her to be short of breath.

## 2015-10-29 NOTE — Telephone Encounter (Signed)
Spoke to patient regarding recent issues with ShOB. Patient denies orthopnea, but states that if she moves she gets ShOB. Patient also admits to LE edema that she notices during the day that resolves at night.  I asked patient about her overall salt consumption. At first patient said that she did not eat a lot of salty foods, but then she said that she does eat lunch meats and fast foods at times.  I explained to patient how a high salt diet can cause fluid retention, which could be contributing to her ShOB. I asked her to decrease her salt intake in addition to taking her thyroid medication, and call back on Friday as Lenice Llamas asked to give an update.  Patient voiced understanding.   Patient will follow up with the device clinic on 8/21 as scheduled.

## 2015-10-29 NOTE — Telephone Encounter (Signed)
New message  Pt would like an earlier appt than scheduled. Would not accept an appt with a PA. Wants to be checked soon.  Pt c/o Shortness Of Breath: STAT if SOB developed within the last 24 hours or pt is noticeably SOB on the phone  1. Are you currently SOB (can you hear that pt is SOB on the phone)? Yes   2. How long have you been experiencing SOB? Several weeks  3. Are you SOB when sitting or when up moving around?  Move around and then go sit down then she notices that she is SOB  4. Are you currently experiencing any other symptoms? Tiredness in right arm.

## 2015-10-30 ENCOUNTER — Ambulatory Visit: Payer: Medicare Other

## 2015-10-30 DIAGNOSIS — R42 Dizziness and giddiness: Secondary | ICD-10-CM | POA: Diagnosis not present

## 2015-10-30 DIAGNOSIS — H8113 Benign paroxysmal vertigo, bilateral: Secondary | ICD-10-CM

## 2015-10-30 DIAGNOSIS — R2689 Other abnormalities of gait and mobility: Secondary | ICD-10-CM

## 2015-10-30 NOTE — Therapy (Signed)
[facility] [address] [place], [place], [address] Phone: [phone]   Fax:  [phone]  Physical Therapy Treatment  Patient Details  Name: [name] MRN: [id number] Date of Birth: 08/29/1925 Referring Provider: Dr. Marzetta Board  Encounter Date: 10/30/2015      PT End of Session - 10/30/15 1129    Visit Number 8   Number of Visits 9   Date for PT Re-Evaluation 11/02/15   Authorization Type G-code and progress note every 10th visit.   PT Start Time 1102   PT Stop Time 1128  d/c   PT Time Calculation (min) 26 min   Equipment Utilized During Treatment Gait belt   Activity Tolerance Patient tolerated treatment well   Behavior During Therapy WFL for tasks assessed/performed      Past Medical History:  Diagnosis Date  . Anxiety   . Atrial fibrillation ([place])   . Bradycardia   . Complication of anesthesia   . Difficulty sleeping   . Dysrhythmia    A FIB  . H/O hiatal hernia   . History of kidney stones   . History of skin cancer   . History of transfusion   . Hypertension   . Osteoarthritis   . Pacemaker   . Pneumonia   . PONV (postoperative nausea and vomiting)   . Shortness of breath    WITH EXERTION    Past Surgical History:  Procedure Laterality Date  . APPENDECTOMY  1960  . ARTHROPLASTY  1994   Left total knee  . ARTHROPLASTY  2000   Total right knee  . BACK SURGERY    . CHOLECYSTECTOMY    . DG SELECTED HSG GDC ONLY  2002   Dilation  . JOINT REPLACEMENT     knee replacement  . Microdiskectomy  05/2004   Left L4-L5  . PACEMAKER INSERTION    . TOTAL ABDOMINAL HYSTERECTOMY  1969  . TOTAL HIP ARTHROPLASTY Right 12/24/2013   Procedure: RIGHT TOTAL HIP ARTHROPLASTY ANTERIOR APPROACH;  Surgeon: Gearlean Alf, MD;  Location: [facility];  Service: Orthopedics;  Laterality: Right;    There were no vitals filed for this visit.      Subjective Assessment - 10/30/15 1104    Subjective  Pt reported she contacted MD regarding SOB and they told her to take new thyroid medication as thyroid issues can cause SOB.   Patient is accompained by: Family member  grandson-Tyler   Pertinent History a-fib with Coumadin, HTN, OA, Hx R THA and B TKA   Patient Stated Goals I want to be able to get out and go and not feel dizzy.    Currently in Pain? No/denies                Vestibular Assessment - 10/30/15 1106      Positional Testing   Dix-Hallpike Dix-Hallpike Right;Dix-Hallpike Left     Dix-Hallpike Right   Dix-Hallpike Right Duration None   Dix-Hallpike Right Symptoms No nystagmus     Dix-Hallpike Left   Dix-Hallpike Left Duration none   Dix-Hallpike Left Symptoms No nystagmus                 [facility] Adult PT Treatment/Exercise - 10/30/15 1110      Ambulation/Gait   Ambulation/Gait Yes   Ambulation/Gait Assistance 6: Modified independent (Device/Increase time)   Ambulation/Gait Assistance Details Pt noted to decr. speed after approx. 300' of amb. 2/2 fatigue.   Ambulation Distance (Feet) 500 Feet  Assistive device Straight cane   Gait Pattern Step-through pattern;Decreased stride length;Wide base of support   Ambulation Surface Level;Unlevel;Indoor;Paved;Outdoor     Standardized Balance Assessment   Standardized Balance Assessment Dynamic Gait Index     Dynamic Gait Index   Level Surface Mild Impairment   Change in Gait Speed Mild Impairment   Gait with Horizontal Head Turns Normal   Gait with Vertical Head Turns Normal   Gait and Pivot Turn Normal   Step Over Obstacle Normal   Step Around Obstacles Normal   Steps Mild Impairment   Total Score 21      Pt completed [place] after session: 6%. Dizziness has low impact on functional activities and quality of life.          PT Education - 2015/11/25 1128    Education provided Yes   Education Details PT discussed goals and progress. Pt agreeable.   Person(s) Educated Patient;Child(ren)  grandson    Methods Explanation   Comprehension Verbalized understanding          PT Short Term Goals - 10/03/15 1602      PT SHORT TERM GOAL #1   Title same as LTGs           PT Long Term Goals - 2015-11-25 1130      PT LONG TERM GOAL #1   Title Pt will be IND in HEP to improve dizziness and balance. Target date: 10/31/15   Status Partially Met     PT LONG TERM GOAL #2   Title Pt will report 0/10 dizziness during B Dix-Hallpike and horizontal canal testing in order to perform bed mobility safely. Target date: 10/31/15   Status Achieved     PT LONG TERM GOAL #3   Title Pt will amb. 500' over even/uneven terrain at MOD I level with LRAD to improve functional mobility. Target date: 10/31/15   Status Achieved     PT LONG TERM GOAL #4   Title Pt will improve DGI score to >/=20/24 to decr. falls risk.  Target date: 10/31/15   Status Achieved               Plan - 11-25-2015 1129    Clinical Impression Statement Pt demonstrated progress, as she met LTGs 2-4 and partially met LTG 1. Please see d/c summary for details.   Clinical Impairments Affecting Rehab Potential hx of vertigo, co-morbidities   Consulted and Agree with Plan of Care Patient;Family member/caregiver   Family Member Consulted son-Gary      Patient will benefit from skilled therapeutic intervention in order to improve the following deficits and impairments:     Visit Diagnosis: Other abnormalities of gait and mobility  Dizziness and giddiness  BPPV (benign paroxysmal positional vertigo), bilateral       G-Codes - 25-Nov-2015 1130    Functional Assessment Tool Used DHI: 6% and 0/10 dizziness during L Dix-Hallpike testing   Functional Limitation Self care   Self Care Goal Status ([id number]) At least 1 percent but less than 20 percent impaired, limited or restricted   Self Care Discharge Status [phone]) At least 1 percent but less than 20 percent impaired, limited or restricted      Problem List Patient Active Problem  List   Diagnosis Date Noted  . Fall 09/24/2015  . Dizziness 09/24/2015  . Postoperative anemia due to acute blood loss 12/27/2013  . Postop Transfusion 12/27/2013  . OA (osteoarthritis) of hip 12/24/2013  . Preop cardiovascular exam 09/25/2013  . CARDIAC  PACEMAKER IN SITU 08/29/2008  . Essential hypertension 08/21/2008  . ATRIAL FIBRILLATION 08/21/2008  . BRADYCARDIA 08/21/2008  . PNEUMONIA 08/21/2008  . GERD 08/21/2008  . NEPHROLITHIASIS 08/21/2008  . OSTEOARTHRITIS 08/21/2008    Antawan Mchugh L 10/30/2015, 11:31 AM  [facility] [address] [place], [place], [address] Phone: [phone]   Fax:  [phone]  Name: [name] MRN: [id number] Date of Birth: Aug 01, 1925  PHYSICAL THERAPY DISCHARGE SUMMARY  Visits from Start of Care: 8  Current functional level related to goals / functional outcomes:     PT Long Term Goals - 10/30/15 1130      PT LONG TERM GOAL #1   Title Pt will be IND in HEP to improve dizziness and balance. Target date: 10/31/15   Status Partially Met     PT LONG TERM GOAL #2   Title Pt will report 0/10 dizziness during B Dix-Hallpike and horizontal canal testing in order to perform bed mobility safely. Target date: 10/31/15   Status Achieved     PT LONG TERM GOAL #3   Title Pt will amb. 500' over even/uneven terrain at MOD I level with LRAD to improve functional mobility. Target date: 10/31/15   Status Achieved     PT LONG TERM GOAL #4   Title Pt will improve DGI score to >/=20/24 to decr. falls risk.  Target date: 10/31/15   Status Achieved        Remaining deficits: Decr. Endurance but dizziness has resolved.   Education / Equipment: HEP  Plan: Patient agrees to discharge.  Patient goals were met. Patient is being discharged due to meeting the stated rehab goals.  ?????           Geoffry Paradise, PT,DPT 10/30/15 11:31 AM Phone: [phone] Fax: [phone]

## 2015-11-05 DIAGNOSIS — I482 Chronic atrial fibrillation: Secondary | ICD-10-CM | POA: Diagnosis not present

## 2015-11-05 DIAGNOSIS — Z7901 Long term (current) use of anticoagulants: Secondary | ICD-10-CM | POA: Diagnosis not present

## 2015-11-11 ENCOUNTER — Telehealth: Payer: Self-pay | Admitting: Internal Medicine

## 2015-11-11 NOTE — Telephone Encounter (Signed)
New Message  Pt c/o medication issue:  1. Name of Medication: Furosemide  2. How are you currently taking this medication (dosage and times per day)? 20mg , once per day  3. Are you having a reaction (difficulty breathing--STAT)? No  4. What is your medication issue? Lose of hearing, feels like ear is draining, and ears are popping.  Pt expressed that's one of the side effects.  Pt expressed she is also having swelling in legs also but due to HBP.  Please follow up with pt. Thanks!

## 2015-11-12 NOTE — Telephone Encounter (Addendum)
Discussed with Dr Lovena Le.  Will have her stop Furosemide and start Maxzide 37.5/25 daily.  I have left a message for her to call me back for Dr Tanna Furry recommendations

## 2015-11-13 MED ORDER — TRIAMTERENE-HCTZ 37.5-25 MG PO TABS: 1.0000 | ORAL_TABLET | Freq: Every day | ORAL | 11 refills | Status: DC

## 2015-11-13 NOTE — Telephone Encounter (Signed)
Follow Up:; ° ° °Returning your call. °

## 2015-11-13 NOTE — Telephone Encounter (Signed)
Spoke with patient and she is aware of recommendations.  Will pick up new medication and let me know if she is better in a couple of weeks

## 2015-11-17 ENCOUNTER — Ambulatory Visit (INDEPENDENT_AMBULATORY_CARE_PROVIDER_SITE_OTHER): Payer: Medicare Other | Admitting: *Deleted

## 2015-11-17 ENCOUNTER — Encounter: Payer: Self-pay | Admitting: Internal Medicine

## 2015-11-17 ENCOUNTER — Telehealth: Payer: Self-pay | Admitting: *Deleted

## 2015-11-17 VITALS — BP 114/70 | HR 64

## 2015-11-17 DIAGNOSIS — I4891 Unspecified atrial fibrillation: Secondary | ICD-10-CM

## 2015-11-17 DIAGNOSIS — Z950 Presence of cardiac pacemaker: Secondary | ICD-10-CM

## 2015-11-17 LAB — CUP PACEART INCLINIC DEVICE CHECK
Battery Voltage: 2.76 V
Date Time Interrogation Session: 20170821140600
Implantable Lead Implant Date: 20070501
Lead Channel Impedance Value: 349 Ohm
Lead Channel Pacing Threshold Pulse Width: 0.4 ms
MDC IDC LEAD LOCATION: 753860
MDC IDC MSMT BATTERY IMPEDANCE: 1700 Ohm
MDC IDC MSMT LEADCHNL RV PACING THRESHOLD AMPLITUDE: 0.875 V
MDC IDC MSMT LEADCHNL RV SENSING INTR AMPL: 16.6 mV
MDC IDC PG SERIAL: 1664242
MDC IDC SET LEADCHNL RV PACING PULSEWIDTH: 0.4 ms
MDC IDC SET LEADCHNL RV SENSING SENSITIVITY: 2 mV
MDC IDC STAT BRADY RV PERCENT PACED: 66 %
Pulse Gen Model: 5156

## 2015-11-17 NOTE — Progress Notes (Signed)
  Pacemaker check in clinic. Normal device function. Thresholds, sensing, impedances consistent with previous measurements. Device programmed to maximize longevity. No episode triggers enabled. Device programmed at appropriate safety margins. Histogram distribution appropriate for patient activity level. Device programmed to optimize intrinsic conduction. Estimated longevity 6.25-7.5 years. Patient education completed. ROV with GT in 6 months.  Patient had concerns about ongoing fatigue, feels it may be related to some of her medications.  Encouraged patient to follow-up with PCP, Dr. Rosana Hoes.  Patient and daughter-in-law verbalize understanding.  See phone note from 11/17/15 regarding medication list clarification.

## 2015-11-17 NOTE — Telephone Encounter (Signed)
Called patient to clarify whether she is taking furosemide.  Patient reports that she is not taking furosemide per Dr. Tanna Furry instructions (see phone note from 11/11/15).  Patient is taking Maxide as prescribed.  Patient did not have updated medication list with her at device clinic appointment earlier today.  She is appreciative of call and denies additional questions or concerns at this time.

## 2015-11-17 NOTE — Patient Instructions (Signed)
Your physician wants you to follow-up in: 6 months with Dr Taylor You will receive a reminder letter in the mail two months in advance. If you don't receive a letter, please call our office to schedule the follow-up appointment.  

## 2015-12-10 DIAGNOSIS — Z23 Encounter for immunization: Secondary | ICD-10-CM | POA: Diagnosis not present

## 2015-12-10 DIAGNOSIS — I482 Chronic atrial fibrillation: Secondary | ICD-10-CM | POA: Diagnosis not present

## 2015-12-10 DIAGNOSIS — Z7901 Long term (current) use of anticoagulants: Secondary | ICD-10-CM | POA: Diagnosis not present

## 2015-12-22 ENCOUNTER — Encounter: Payer: Self-pay | Admitting: Internal Medicine

## 2015-12-22 DIAGNOSIS — R001 Bradycardia, unspecified: Secondary | ICD-10-CM | POA: Diagnosis not present

## 2015-12-22 DIAGNOSIS — I495 Sick sinus syndrome: Secondary | ICD-10-CM | POA: Diagnosis not present

## 2015-12-23 DIAGNOSIS — E0520 Thyrotoxicosis with toxic multinodular goiter without thyrotoxic crisis or storm: Secondary | ICD-10-CM | POA: Diagnosis not present

## 2015-12-23 DIAGNOSIS — D692 Other nonthrombocytopenic purpura: Secondary | ICD-10-CM | POA: Diagnosis not present

## 2015-12-23 DIAGNOSIS — E441 Mild protein-calorie malnutrition: Secondary | ICD-10-CM | POA: Diagnosis not present

## 2015-12-23 DIAGNOSIS — E871 Hypo-osmolality and hyponatremia: Secondary | ICD-10-CM | POA: Diagnosis not present

## 2015-12-23 DIAGNOSIS — I10 Essential (primary) hypertension: Secondary | ICD-10-CM | POA: Diagnosis not present

## 2015-12-23 DIAGNOSIS — I679 Cerebrovascular disease, unspecified: Secondary | ICD-10-CM | POA: Diagnosis not present

## 2015-12-23 DIAGNOSIS — Z6826 Body mass index (BMI) 26.0-26.9, adult: Secondary | ICD-10-CM | POA: Diagnosis not present

## 2015-12-23 DIAGNOSIS — E7800 Pure hypercholesterolemia, unspecified: Secondary | ICD-10-CM | POA: Diagnosis not present

## 2015-12-23 DIAGNOSIS — M1990 Unspecified osteoarthritis, unspecified site: Secondary | ICD-10-CM | POA: Diagnosis not present

## 2015-12-23 DIAGNOSIS — I482 Chronic atrial fibrillation: Secondary | ICD-10-CM | POA: Diagnosis not present

## 2015-12-23 DIAGNOSIS — G459 Transient cerebral ischemic attack, unspecified: Secondary | ICD-10-CM | POA: Diagnosis not present

## 2016-01-08 DIAGNOSIS — I482 Chronic atrial fibrillation: Secondary | ICD-10-CM | POA: Diagnosis not present

## 2016-01-08 DIAGNOSIS — G459 Transient cerebral ischemic attack, unspecified: Secondary | ICD-10-CM | POA: Diagnosis not present

## 2016-01-08 DIAGNOSIS — Z7901 Long term (current) use of anticoagulants: Secondary | ICD-10-CM | POA: Diagnosis not present

## 2016-01-20 DIAGNOSIS — Z6826 Body mass index (BMI) 26.0-26.9, adult: Secondary | ICD-10-CM | POA: Diagnosis not present

## 2016-01-20 DIAGNOSIS — L299 Pruritus, unspecified: Secondary | ICD-10-CM | POA: Diagnosis not present

## 2016-02-12 DIAGNOSIS — I679 Cerebrovascular disease, unspecified: Secondary | ICD-10-CM | POA: Diagnosis not present

## 2016-02-12 DIAGNOSIS — Z7901 Long term (current) use of anticoagulants: Secondary | ICD-10-CM | POA: Diagnosis not present

## 2016-02-12 DIAGNOSIS — I482 Chronic atrial fibrillation: Secondary | ICD-10-CM | POA: Diagnosis not present

## 2016-03-16 DIAGNOSIS — Z7901 Long term (current) use of anticoagulants: Secondary | ICD-10-CM | POA: Diagnosis not present

## 2016-03-16 DIAGNOSIS — I482 Chronic atrial fibrillation: Secondary | ICD-10-CM | POA: Diagnosis not present

## 2016-03-23 ENCOUNTER — Encounter: Payer: Self-pay | Admitting: Internal Medicine

## 2016-03-23 DIAGNOSIS — I495 Sick sinus syndrome: Secondary | ICD-10-CM | POA: Diagnosis not present

## 2016-03-23 DIAGNOSIS — R001 Bradycardia, unspecified: Secondary | ICD-10-CM | POA: Diagnosis not present

## 2016-03-30 DIAGNOSIS — I495 Sick sinus syndrome: Secondary | ICD-10-CM | POA: Diagnosis not present

## 2016-04-07 DIAGNOSIS — I482 Chronic atrial fibrillation: Secondary | ICD-10-CM | POA: Diagnosis not present

## 2016-04-07 DIAGNOSIS — Z6827 Body mass index (BMI) 27.0-27.9, adult: Secondary | ICD-10-CM | POA: Diagnosis not present

## 2016-04-07 DIAGNOSIS — I10 Essential (primary) hypertension: Secondary | ICD-10-CM | POA: Diagnosis not present

## 2016-04-07 DIAGNOSIS — K5900 Constipation, unspecified: Secondary | ICD-10-CM | POA: Diagnosis not present

## 2016-04-07 DIAGNOSIS — E441 Mild protein-calorie malnutrition: Secondary | ICD-10-CM | POA: Diagnosis not present

## 2016-04-07 DIAGNOSIS — D692 Other nonthrombocytopenic purpura: Secondary | ICD-10-CM | POA: Diagnosis not present

## 2016-04-07 DIAGNOSIS — I679 Cerebrovascular disease, unspecified: Secondary | ICD-10-CM | POA: Diagnosis not present

## 2016-04-07 DIAGNOSIS — M50121 Cervical disc disorder at C4-C5 level with radiculopathy: Secondary | ICD-10-CM | POA: Diagnosis not present

## 2016-04-07 DIAGNOSIS — E7800 Pure hypercholesterolemia, unspecified: Secondary | ICD-10-CM | POA: Diagnosis not present

## 2016-04-07 DIAGNOSIS — Z7901 Long term (current) use of anticoagulants: Secondary | ICD-10-CM | POA: Diagnosis not present

## 2016-04-07 DIAGNOSIS — E871 Hypo-osmolality and hyponatremia: Secondary | ICD-10-CM | POA: Diagnosis not present

## 2016-04-07 DIAGNOSIS — E0520 Thyrotoxicosis with toxic multinodular goiter without thyrotoxic crisis or storm: Secondary | ICD-10-CM | POA: Diagnosis not present

## 2016-04-21 DIAGNOSIS — I482 Chronic atrial fibrillation: Secondary | ICD-10-CM | POA: Diagnosis not present

## 2016-04-21 DIAGNOSIS — Z7901 Long term (current) use of anticoagulants: Secondary | ICD-10-CM | POA: Diagnosis not present

## 2016-04-21 DIAGNOSIS — G459 Transient cerebral ischemic attack, unspecified: Secondary | ICD-10-CM | POA: Diagnosis not present

## 2016-05-18 DIAGNOSIS — I482 Chronic atrial fibrillation: Secondary | ICD-10-CM | POA: Diagnosis not present

## 2016-05-18 DIAGNOSIS — E441 Mild protein-calorie malnutrition: Secondary | ICD-10-CM | POA: Diagnosis not present

## 2016-05-18 DIAGNOSIS — G629 Polyneuropathy, unspecified: Secondary | ICD-10-CM | POA: Diagnosis not present

## 2016-05-18 DIAGNOSIS — Z6827 Body mass index (BMI) 27.0-27.9, adult: Secondary | ICD-10-CM | POA: Diagnosis not present

## 2016-05-18 DIAGNOSIS — E0520 Thyrotoxicosis with toxic multinodular goiter without thyrotoxic crisis or storm: Secondary | ICD-10-CM | POA: Diagnosis not present

## 2016-06-07 ENCOUNTER — Inpatient Hospital Stay (HOSPITAL_COMMUNITY)
Admission: EM | Admit: 2016-06-07 | Discharge: 2016-06-15 | DRG: 202 | Disposition: A | Discharge status: Home / Self Care | Payer: Medicare Other | Attending: Internal Medicine | Admitting: Internal Medicine

## 2016-06-07 ENCOUNTER — Emergency Department (HOSPITAL_COMMUNITY): Payer: Medicare Other

## 2016-06-07 DIAGNOSIS — I4891 Unspecified atrial fibrillation: Secondary | ICD-10-CM | POA: Diagnosis present

## 2016-06-07 DIAGNOSIS — Z950 Presence of cardiac pacemaker: Secondary | ICD-10-CM | POA: Diagnosis present

## 2016-06-07 DIAGNOSIS — Z85828 Personal history of other malignant neoplasm of skin: Secondary | ICD-10-CM

## 2016-06-07 DIAGNOSIS — E0500 Thyrotoxicosis with diffuse goiter without thyrotoxic crisis or storm: Secondary | ICD-10-CM | POA: Diagnosis present

## 2016-06-07 DIAGNOSIS — E875 Hyperkalemia: Secondary | ICD-10-CM | POA: Diagnosis present

## 2016-06-07 DIAGNOSIS — J329 Chronic sinusitis, unspecified: Secondary | ICD-10-CM

## 2016-06-07 DIAGNOSIS — K219 Gastro-esophageal reflux disease without esophagitis: Secondary | ICD-10-CM | POA: Diagnosis present

## 2016-06-07 DIAGNOSIS — E222 Syndrome of inappropriate secretion of antidiuretic hormone: Secondary | ICD-10-CM | POA: Diagnosis not present

## 2016-06-07 DIAGNOSIS — Z87442 Personal history of urinary calculi: Secondary | ICD-10-CM

## 2016-06-07 DIAGNOSIS — E8770 Fluid overload, unspecified: Secondary | ICD-10-CM | POA: Diagnosis present

## 2016-06-07 DIAGNOSIS — Z87891 Personal history of nicotine dependence: Secondary | ICD-10-CM

## 2016-06-07 DIAGNOSIS — Z7901 Long term (current) use of anticoagulants: Secondary | ICD-10-CM

## 2016-06-07 DIAGNOSIS — R0602 Shortness of breath: Secondary | ICD-10-CM

## 2016-06-07 DIAGNOSIS — R062 Wheezing: Secondary | ICD-10-CM | POA: Diagnosis not present

## 2016-06-07 DIAGNOSIS — I482 Chronic atrial fibrillation: Secondary | ICD-10-CM | POA: Diagnosis present

## 2016-06-07 DIAGNOSIS — R52 Pain, unspecified: Secondary | ICD-10-CM

## 2016-06-07 DIAGNOSIS — Z8701 Personal history of pneumonia (recurrent): Secondary | ICD-10-CM

## 2016-06-07 DIAGNOSIS — J209 Acute bronchitis, unspecified: Secondary | ICD-10-CM | POA: Diagnosis not present

## 2016-06-07 DIAGNOSIS — R0600 Dyspnea, unspecified: Secondary | ICD-10-CM

## 2016-06-07 DIAGNOSIS — J019 Acute sinusitis, unspecified: Secondary | ICD-10-CM | POA: Diagnosis present

## 2016-06-07 DIAGNOSIS — W19XXXA Unspecified fall, initial encounter: Secondary | ICD-10-CM | POA: Diagnosis present

## 2016-06-07 DIAGNOSIS — E876 Hypokalemia: Secondary | ICD-10-CM | POA: Diagnosis present

## 2016-06-07 DIAGNOSIS — F419 Anxiety disorder, unspecified: Secondary | ICD-10-CM | POA: Diagnosis present

## 2016-06-07 DIAGNOSIS — I119 Hypertensive heart disease without heart failure: Secondary | ICD-10-CM | POA: Diagnosis present

## 2016-06-07 DIAGNOSIS — J40 Bronchitis, not specified as acute or chronic: Secondary | ICD-10-CM | POA: Diagnosis not present

## 2016-06-07 DIAGNOSIS — I10 Essential (primary) hypertension: Secondary | ICD-10-CM | POA: Diagnosis present

## 2016-06-07 LAB — PROTIME-INR
INR: 1.86
Prothrombin Time: 21.7 seconds — ABNORMAL HIGH (ref 11.4–15.2)

## 2016-06-07 LAB — COMPREHENSIVE METABOLIC PANEL
ALK PHOS: 86 U/L (ref 38–126)
ALT: 14 U/L (ref 14–54)
AST: 21 U/L (ref 15–41)
Albumin: 3.8 g/dL (ref 3.5–5.0)
Anion gap: 5 (ref 5–15)
BUN: 20 mg/dL (ref 6–20)
CALCIUM: 9.1 mg/dL (ref 8.9–10.3)
CO2: 29 mmol/L (ref 22–32)
CREATININE: 1.02 mg/dL — AB (ref 0.44–1.00)
Chloride: 100 mmol/L — ABNORMAL LOW (ref 101–111)
GFR, EST AFRICAN AMERICAN: 55 mL/min — AB (ref >60)
GFR, EST NON AFRICAN AMERICAN: 48 mL/min — AB (ref >60)
Glucose, Bld: 110 mg/dL — ABNORMAL HIGH (ref 65–99)
Potassium: 3.6 mmol/L (ref 3.5–5.1)
SODIUM: 134 mmol/L — AB (ref 135–145)
Total Bilirubin: 0.5 mg/dL (ref 0.3–1.2)
Total Protein: 7 g/dL (ref 6.5–8.1)

## 2016-06-07 LAB — CBC WITH DIFFERENTIAL/PLATELET
BASOS ABS: 0 10*3/uL (ref 0.0–0.1)
Basophils Relative: 0 %
Eosinophils Absolute: 0.1 10*3/uL (ref 0.0–0.7)
Eosinophils Relative: 1 %
HCT: 32.5 % — ABNORMAL LOW (ref 36.0–46.0)
HEMOGLOBIN: 11 g/dL — AB (ref 12.0–15.0)
LYMPHS ABS: 1.2 10*3/uL (ref 0.7–4.0)
LYMPHS PCT: 10 %
MCH: 30 pg (ref 26.0–34.0)
MCHC: 33.8 g/dL (ref 30.0–36.0)
MCV: 88.6 fL (ref 78.0–100.0)
Monocytes Absolute: 0.8 10*3/uL (ref 0.1–1.0)
Monocytes Relative: 7 %
NEUTROS PCT: 82 %
Neutro Abs: 9.5 10*3/uL — ABNORMAL HIGH (ref 1.7–7.7)
Platelets: 258 10*3/uL (ref 150–400)
RBC: 3.67 MIL/uL — AB (ref 3.87–5.11)
RDW: 13.4 % (ref 11.5–15.5)
WBC: 11.6 10*3/uL — AB (ref 4.0–10.5)

## 2016-06-07 LAB — I-STAT TROPONIN, ED: Troponin i, poc: 0 ng/mL (ref 0.00–0.08)

## 2016-06-07 MED ORDER — METHYLPREDNISOLONE SODIUM SUCC 125 MG IJ SOLR
125.0000 mg | Freq: Once | INTRAMUSCULAR | Status: AC
  Administered 2016-06-07: 125 mg via INTRAVENOUS
  Filled 2016-06-07: qty 2

## 2016-06-07 MED ORDER — ALBUTEROL SULFATE (2.5 MG/3ML) 0.083% IN NEBU
5.0000 mg | INHALATION_SOLUTION | Freq: Once | RESPIRATORY_TRACT | Status: AC
  Administered 2016-06-08: 5 mg via RESPIRATORY_TRACT
  Filled 2016-06-07: qty 6

## 2016-06-07 NOTE — ED Notes (Signed)
Pt state that she has been having a cough  2-3 days and has increased congestion, pt reports  that today she has had increasing SOB with ambulation pt has left sided expiratory  Wheezes noted

## 2016-06-07 NOTE — ED Provider Notes (Signed)
[place] DEPT [facility] Provider Note   CSN: [id number] Arrival date & time: 06/07/16  2132     History   Chief Complaint Chief Complaint  Patient presents with  . Shortness of Breath    HPI [name] is a 83 y.o. female.  HPI Patient's had 2-3 days of increasing shortness of breath. Shortness breath is worse with lying flat and exertion. Associated with wheezing and nonproductive cough. No fever or chills. No recent medication changes other than Robitussin which she took this afternoon. Patient has episodic lower extremity swelling which is unchanged. Past Medical History:  Diagnosis Date  . Anxiety   . Atrial fibrillation ([place])   . Bradycardia   . Complication of anesthesia   . Difficulty sleeping   . Dysrhythmia    A FIB  . H/O hiatal hernia   . History of kidney stones   . History of skin cancer   . History of transfusion   . Hypertension   . Osteoarthritis   . Pacemaker   . Pneumonia   . PONV (postoperative nausea and vomiting)   . Shortness of breath    WITH EXERTION    Patient Active Problem List   Diagnosis Date Noted  . Acute bronchitis 06/08/2016  . Fall 09/24/2015  . Dizziness 09/24/2015  . Postoperative anemia due to acute blood loss 12/27/2013  . Postop Transfusion 12/27/2013  . OA (osteoarthritis) of hip 12/24/2013  . Preop cardiovascular exam 09/25/2013  . Cardiac pacemaker in situ 08/29/2008  . Essential hypertension 08/21/2008  . ATRIAL FIBRILLATION 08/21/2008  . BRADYCARDIA 08/21/2008  . PNEUMONIA 08/21/2008  . GERD 08/21/2008  . NEPHROLITHIASIS 08/21/2008  . OSTEOARTHRITIS 08/21/2008    Past Surgical History:  Procedure Laterality Date  . APPENDECTOMY  1960  . ARTHROPLASTY  1994   Left total knee  . ARTHROPLASTY  2000   Total right knee  . BACK SURGERY    . CHOLECYSTECTOMY    . DG SELECTED [facility] ONLY  2002   Dilation  . JOINT REPLACEMENT     knee replacement  . Microdiskectomy  05/2004   Left L4-L5  . PACEMAKER  INSERTION    . TOTAL ABDOMINAL HYSTERECTOMY  1969  . TOTAL HIP ARTHROPLASTY Right 12/24/2013   Procedure: RIGHT TOTAL HIP ARTHROPLASTY ANTERIOR APPROACH;  Surgeon: Gearlean Alf, MD;  Location: [facility];  Service: Orthopedics;  Laterality: Right;    OB History    No data available       Home Medications    Prior to Admission medications   Medication Sig Start Date End Date Taking? Authorizing Provider  amLODipine (NORVASC) 10 MG tablet Take 10 mg by mouth every morning.    Yes Historical Provider, MD  clonazePAM (KLONOPIN) 0.5 MG tablet Take 0.5 mg by mouth daily as needed for anxiety.  03/18/15  Yes Historical Provider, MD  cloNIDine (CATAPRES) 0.1 MG tablet Take 0.1 mg by mouth 2 (two) times daily.    Yes Historical Provider, MD  irbesartan (AVAPRO) 300 MG tablet Take 300 mg by mouth every morning.   Yes Historical Provider, MD  methimazole (TAPAZOLE) 5 MG tablet TAKE ONE TABLET TWICE DAILY WITH FOOD 10/28/15  Yes Historical Provider, MD  potassium chloride SA (K-DUR,KLOR-CON) 20 MEQ tablet Take 20 mEq by mouth every morning.    Yes Historical Provider, MD  pravastatin (PRAVACHOL) 40 MG tablet Take 40 mg by mouth every morning.    Yes Historical Provider, MD  PREVIDENT 5000 PLUS 1.1 % CREA  dental cream Take 1 Dose by mouth at bedtime. 05/25/16  Yes Historical Provider, MD  triamterene-hydrochlorothiazide (MAXZIDE-25) 37.5-25 MG tablet Take 1 tablet by mouth daily. 11/13/15  Yes Evans Lance, MD  warfarin (COUMADIN) 2 MG tablet Take 1-2 tablets (2-4 mg total) by mouth as directed. Take Coumadin for three weeks for postoperative protocol and then the patient may resume their previous Coumadin home regimen.  The dose may need to be adjusted based upon the INR.  Please follow the INR and titrate Coumadin dose for a therapeutic range between 2.0 and 3.0 INR.  After completing the three weeks of Coumadin, the patient may resume their previous Coumadin home regimen. Patient taking differently: Take  2-4 mg by mouth daily. Take 2 tablets on Monday and then take 1 tablet all the other days 12/27/13  Yes Alexzandrew L Dara Lords, PA-C    Family History Family History  Problem Relation Age of Onset  . Heart disease Mother   . Leukemia Father     Social History Social History  Substance Use Topics  . Smoking status: Former Smoker    Quit date: 12/18/1973  . Smokeless tobacco: Former Systems developer     Comment: Smoker for 25 years and quit at around age 9  . Alcohol use No     Allergies   Morphine and related   Review of Systems Review of Systems  Constitutional: Negative for chills and fever.  Respiratory: Positive for cough, shortness of breath and wheezing.   Cardiovascular: Positive for leg swelling. Negative for chest pain and palpitations.  Gastrointestinal: Negative for abdominal pain, constipation, diarrhea and vomiting.  Genitourinary: Negative for dysuria, flank pain and frequency.  Musculoskeletal: Negative for back pain, neck pain and neck stiffness.  Skin: Negative for rash and wound.  Neurological: Negative for dizziness, weakness, light-headedness, numbness and headaches.  All other systems reviewed and are negative.    Physical Exam Updated Vital Signs BP (!) 133/48 (BP Location: Right Arm)   Pulse 60   Temp 97.6 F (36.4 C) (Oral)   Resp 16   Ht 5\' 4"  (1.626 m)   Wt 154 lb 12.8 oz (70.2 kg)   SpO2 97%   BMI 26.57 kg/m   Physical Exam  Constitutional: She is oriented to person, place, and time. She appears well-developed and well-nourished. No distress.  HENT:  Head: Normocephalic and atraumatic.  Mouth/Throat: Oropharynx is clear and moist.  Eyes: EOM are normal. Pupils are equal, round, and reactive to light.  Neck: Normal range of motion. Neck supple.  Cardiovascular: Normal rate and regular rhythm.   Pulmonary/Chest: Effort normal. She has wheezes (expiratory wheezing throughout.).  Few crackles in bilateral lung bases.  Abdominal: Soft. Bowel sounds  are normal. There is no tenderness. There is no rebound and no guarding.  Musculoskeletal: Normal range of motion. She exhibits edema. She exhibits no tenderness.  1+ bilateral lower extremities edema. No calf asymmetry or tenderness.  Neurological: She is alert and oriented to person, place, and time.  Skin: Skin is warm and dry. Capillary refill takes less than 2 seconds. No rash noted. No erythema.  Psychiatric: She has a normal mood and affect. Her behavior is normal.  Nursing note and vitals reviewed.    ED Treatments / Results  Labs (all labs ordered are listed, but only abnormal results are displayed) Labs Reviewed  CBC WITH DIFFERENTIAL/PLATELET - Abnormal; Notable for the following:       Result Value   WBC 11.6 (*)  RBC 3.67 (*)    Hemoglobin 11.0 (*)    HCT 32.5 (*)    Neutro Abs 9.5 (*)    All other components within normal limits  COMPREHENSIVE METABOLIC PANEL - Abnormal; Notable for the following:    Sodium 134 (*)    Chloride 100 (*)    Glucose, Bld 110 (*)    Creatinine, Ser 1.02 (*)    GFR calc non Af Amer 48 (*)    GFR calc Af Amer 55 (*)    All other components within normal limits  BRAIN NATRIURETIC PEPTIDE - Abnormal; Notable for the following:    B Natriuretic Peptide 203.9 (*)    All other components within normal limits  PROTIME-INR - Abnormal; Notable for the following:    Prothrombin Time 21.7 (*)    All other components within normal limits  CBC WITH DIFFERENTIAL/PLATELET - Abnormal; Notable for the following:    WBC 12.1 (*)    RBC 3.66 (*)    Hemoglobin 10.9 (*)    HCT 32.7 (*)    Neutro Abs 11.7 (*)    Lymphs Abs 0.3 (*)    All other components within normal limits  MAGNESIUM - Abnormal; Notable for the following:    Magnesium 1.6 (*)    All other components within normal limits  COMPREHENSIVE METABOLIC PANEL - Abnormal; Notable for the following:    Sodium 133 (*)    Potassium 3.0 (*)    Chloride 98 (*)    CO2 21 (*)    Glucose, Bld  267 (*)    BUN 23 (*)    Creatinine, Ser 1.26 (*)    GFR calc non Af Amer 37 (*)    GFR calc Af Amer 43 (*)    All other components within normal limits  PROTIME-INR - Abnormal; Notable for the following:    Prothrombin Time 27.9 (*)    All other components within normal limits  PROTIME-INR - Abnormal; Notable for the following:    Prothrombin Time 28.6 (*)    All other components within normal limits  INFLUENZA PANEL BY PCR (TYPE A & B)  TROPONIN I  TROPONIN I  TROPONIN I  I-STAT TROPOININ, ED    EKG  EKG Interpretation  Date/Time:  Monday June 07 2016 21:41:55 EDT Ventricular Rate:  61 PR Interval:    QRS Duration: 175 QT Interval:  351 QTC Calculation: 368 R Axis:   79 Text Interpretation:  Ventricular-paced complexes No further analysis attempted due to paced rhythm Confirmed by Lita Mains  MD, Blaze Nylund ([id number]) on 06/07/2016 10:40:38 PM Also confirmed by Lita Mains  MD, Kirklin Mcduffee ([id number]), editor [place] CT, Leda Gauze [phone])  on 06/08/2016 8:00:46 AM       Radiology Dg Chest 2 View  Result Date: 06/07/2016 CLINICAL DATA:  Wheezing, SOB, congestion. Worse today. Pacemaker placed 2007. Non smoker EXAM: CHEST  2 VIEW COMPARISON:  05/13/2015 FINDINGS: Cardiac pacemaker. Linear fibrosis in the left lung base is unchanged since prior study. Slight fibrosis also demonstrated in the right lung base, unchanged. Peribronchial thickening centrally suggesting chronic bronchitis. No focal airspace disease or consolidation. Normal heart size and pulmonary vascularity. Calcified and tortuous aorta. Degenerative changes in the spine. IMPRESSION: Chronic bronchitic changes in chronic scarring in the lung bases. No evidence of active pulmonary disease. Electronically Signed   By: Lucienne Capers M.D.   On: 06/07/2016 22:02    Procedures Procedures (including critical care time)  Medications Ordered in ED Medications  alum & mag hydroxide-simeth (  MAALOX/MYLANTA) 200-200-20 MG/5ML suspension 30 mL  (30 mLs Oral Given 06/08/16 2257)  methimazole (TAPAZOLE) tablet 5 mg (5 mg Oral Given 06/09/16 0941)  irbesartan (AVAPRO) tablet 300 mg (300 mg Oral Given 06/09/16 0941)  amLODipine (NORVASC) tablet 10 mg (10 mg Oral Given 06/09/16 0941)  cloNIDine (CATAPRES) tablet 0.1 mg (0.1 mg Oral Given 06/09/16 0941)  potassium chloride SA (K-DUR,KLOR-CON) CR tablet 20 mEq (20 mEq Oral Given 06/09/16 0941)  pravastatin (PRAVACHOL) tablet 40 mg (40 mg Oral Given 06/09/16 0941)  budesonide (PULMICORT) nebulizer solution 0.25 mg (0.25 mg Nebulization Given 06/09/16 0814)  acetaminophen (TYLENOL) tablet 650 mg (not administered)    Or  acetaminophen (TYLENOL) suppository 650 mg (not administered)  ondansetron (ZOFRAN) tablet 4 mg ( Oral See Alternative 06/08/16 2204)    Or  ondansetron (ZOFRAN) injection 4 mg (4 mg Intravenous Given 06/08/16 2204)  Warfarin - Pharmacist Dosing Inpatient ( Does not apply Duplicate 9/32/67 1245)  levalbuterol (XOPENEX) nebulizer solution 0.63 mg (not administered)  hydrALAZINE (APRESOLINE) injection 10 mg (not administered)  levalbuterol (XOPENEX) nebulizer solution 0.63 mg (0.63 mg Nebulization Given 06/09/16 1311)  ipratropium (ATROVENT) nebulizer solution 0.5 mg (0.5 mg Nebulization Given 06/09/16 1311)  calcium carbonate (TUMS - dosed in mg elemental calcium) chewable tablet 200 mg of elemental calcium (200 mg of elemental calcium Oral Given 06/08/16 2209)  clonazePAM (KLONOPIN) tablet 0.25 mg (not administered)  methylPREDNISolone sodium succinate (SOLU-MEDROL) 40 mg/mL injection 40 mg (not administered)  doxycycline (VIBRA-TABS) tablet 100 mg (not administered)  warfarin (COUMADIN) tablet 2 mg (not administered)  pantoprazole (PROTONIX) EC tablet 40 mg (not administered)  albuterol (PROVENTIL) (2.5 MG/3ML) 0.083% nebulizer solution 5 mg (5 mg Nebulization Given 06/08/16 0026)  methylPREDNISolone sodium succinate (SOLU-MEDROL) 125 mg/2 mL injection 125 mg (125 mg Intravenous Given  06/07/16 2324)  levofloxacin (LEVAQUIN) IVPB 500 mg (500 mg Intravenous New Bag/Given 06/08/16 0131)  warfarin (COUMADIN) tablet 4 mg (4 mg Oral Given 06/08/16 0945)  potassium chloride SA (K-DUR,KLOR-CON) CR tablet 40 mEq (40 mEq Oral Given 06/08/16 1629)  magnesium sulfate IVPB 2 g 50 mL (2 g Intravenous Given 06/08/16 1629)  promethazine (PHENERGAN) injection 12.5 mg (12.5 mg Intravenous Given 06/08/16 2257)  LORazepam (ATIVAN) injection 0.5 mg (0.5 mg Intravenous Given 06/09/16 0027)  potassium chloride SA (K-DUR,KLOR-CON) CR tablet 40 mEq (40 mEq Oral Given 06/09/16 1438)     Initial Impression / Assessment and Plan / ED Course  I have reviewed the triage vital signs and the nursing notes.  Pertinent labs & imaging results that were available during my care of the patient were reviewed by me and considered in my medical decision making (see chart for details).     Patient with persistent wheezing despite multiple nebulized treatments. Bronchitic changes on chest x-ray. Question whether there may be some element of her dyspnea related to edema. Discussed with hospitalist and will admit.  Final Clinical Impressions(s) / ED Diagnoses   Final diagnoses:  Bronchitis with bronchospasm  Shortness of breath    New Prescriptions Current Discharge Medication List       Julianne Rice, MD 06/09/16 1457

## 2016-06-07 NOTE — ED Triage Notes (Signed)
Pt states that she has been congested and SOB today. Took robitussin this afternoon w/o relief. Alert and oriented.

## 2016-06-08 ENCOUNTER — Encounter (HOSPITAL_COMMUNITY): Payer: Self-pay

## 2016-06-08 DIAGNOSIS — K219 Gastro-esophageal reflux disease without esophagitis: Secondary | ICD-10-CM | POA: Diagnosis present

## 2016-06-08 DIAGNOSIS — E222 Syndrome of inappropriate secretion of antidiuretic hormone: Secondary | ICD-10-CM | POA: Diagnosis present

## 2016-06-08 DIAGNOSIS — J019 Acute sinusitis, unspecified: Secondary | ICD-10-CM | POA: Diagnosis present

## 2016-06-08 DIAGNOSIS — R0981 Nasal congestion: Secondary | ICD-10-CM | POA: Diagnosis not present

## 2016-06-08 DIAGNOSIS — I119 Hypertensive heart disease without heart failure: Secondary | ICD-10-CM | POA: Diagnosis not present

## 2016-06-08 DIAGNOSIS — Z8701 Personal history of pneumonia (recurrent): Secondary | ICD-10-CM | POA: Diagnosis not present

## 2016-06-08 DIAGNOSIS — J9600 Acute respiratory failure, unspecified whether with hypoxia or hypercapnia: Secondary | ICD-10-CM | POA: Diagnosis not present

## 2016-06-08 DIAGNOSIS — J208 Acute bronchitis due to other specified organisms: Secondary | ICD-10-CM | POA: Diagnosis not present

## 2016-06-08 DIAGNOSIS — J209 Acute bronchitis, unspecified: Secondary | ICD-10-CM | POA: Diagnosis not present

## 2016-06-08 DIAGNOSIS — F419 Anxiety disorder, unspecified: Secondary | ICD-10-CM | POA: Diagnosis present

## 2016-06-08 DIAGNOSIS — Z87442 Personal history of urinary calculi: Secondary | ICD-10-CM | POA: Diagnosis not present

## 2016-06-08 DIAGNOSIS — I10 Essential (primary) hypertension: Secondary | ICD-10-CM | POA: Diagnosis not present

## 2016-06-08 DIAGNOSIS — Z85828 Personal history of other malignant neoplasm of skin: Secondary | ICD-10-CM | POA: Diagnosis not present

## 2016-06-08 DIAGNOSIS — Z950 Presence of cardiac pacemaker: Secondary | ICD-10-CM | POA: Diagnosis not present

## 2016-06-08 DIAGNOSIS — R109 Unspecified abdominal pain: Secondary | ICD-10-CM | POA: Diagnosis not present

## 2016-06-08 DIAGNOSIS — I482 Chronic atrial fibrillation: Secondary | ICD-10-CM | POA: Diagnosis present

## 2016-06-08 DIAGNOSIS — I4891 Unspecified atrial fibrillation: Secondary | ICD-10-CM | POA: Diagnosis not present

## 2016-06-08 DIAGNOSIS — E8770 Fluid overload, unspecified: Secondary | ICD-10-CM | POA: Diagnosis present

## 2016-06-08 DIAGNOSIS — I480 Paroxysmal atrial fibrillation: Secondary | ICD-10-CM | POA: Diagnosis not present

## 2016-06-08 DIAGNOSIS — Z87891 Personal history of nicotine dependence: Secondary | ICD-10-CM | POA: Diagnosis not present

## 2016-06-08 DIAGNOSIS — R0602 Shortness of breath: Secondary | ICD-10-CM | POA: Diagnosis not present

## 2016-06-08 DIAGNOSIS — E876 Hypokalemia: Secondary | ICD-10-CM | POA: Diagnosis not present

## 2016-06-08 DIAGNOSIS — E0500 Thyrotoxicosis with diffuse goiter without thyrotoxic crisis or storm: Secondary | ICD-10-CM | POA: Diagnosis not present

## 2016-06-08 DIAGNOSIS — E875 Hyperkalemia: Secondary | ICD-10-CM | POA: Diagnosis present

## 2016-06-08 DIAGNOSIS — Z7901 Long term (current) use of anticoagulants: Secondary | ICD-10-CM | POA: Diagnosis not present

## 2016-06-08 LAB — CBC WITH DIFFERENTIAL/PLATELET
BASOS ABS: 0 10*3/uL (ref 0.0–0.1)
Basophils Relative: 0 %
Eosinophils Absolute: 0 10*3/uL (ref 0.0–0.7)
Eosinophils Relative: 0 %
HCT: 32.7 % — ABNORMAL LOW (ref 36.0–46.0)
HEMOGLOBIN: 10.9 g/dL — AB (ref 12.0–15.0)
LYMPHS ABS: 0.3 10*3/uL — AB (ref 0.7–4.0)
LYMPHS PCT: 3 %
MCH: 29.8 pg (ref 26.0–34.0)
MCHC: 33.3 g/dL (ref 30.0–36.0)
MCV: 89.3 fL (ref 78.0–100.0)
Monocytes Absolute: 0.1 10*3/uL (ref 0.1–1.0)
Monocytes Relative: 1 %
NEUTROS ABS: 11.7 10*3/uL — AB (ref 1.7–7.7)
NEUTROS PCT: 96 %
PLATELETS: 252 10*3/uL (ref 150–400)
RBC: 3.66 MIL/uL — AB (ref 3.87–5.11)
RDW: 13.6 % (ref 11.5–15.5)
WBC: 12.1 10*3/uL — AB (ref 4.0–10.5)

## 2016-06-08 LAB — TROPONIN I: Troponin I: <0.03 ng/mL (ref <0.03)

## 2016-06-08 LAB — COMPREHENSIVE METABOLIC PANEL
ALBUMIN: 3.5 g/dL (ref 3.5–5.0)
ALT: 14 U/L (ref 14–54)
AST: 31 U/L (ref 15–41)
Alkaline Phosphatase: 77 U/L (ref 38–126)
Anion gap: 14 (ref 5–15)
BILIRUBIN TOTAL: 0.7 mg/dL (ref 0.3–1.2)
BUN: 23 mg/dL — AB (ref 6–20)
CHLORIDE: 98 mmol/L — AB (ref 101–111)
CO2: 21 mmol/L — ABNORMAL LOW (ref 22–32)
CREATININE: 1.26 mg/dL — AB (ref 0.44–1.00)
Calcium: 9.1 mg/dL (ref 8.9–10.3)
GFR calc Af Amer: 43 mL/min — ABNORMAL LOW (ref >60)
GFR, EST NON AFRICAN AMERICAN: 37 mL/min — AB (ref >60)
GLUCOSE: 267 mg/dL — AB (ref 65–99)
POTASSIUM: 3 mmol/L — AB (ref 3.5–5.1)
Sodium: 133 mmol/L — ABNORMAL LOW (ref 135–145)
Total Protein: 6.7 g/dL (ref 6.5–8.1)

## 2016-06-08 LAB — PROTIME-INR
INR: 2.55
Prothrombin Time: 27.9 seconds — ABNORMAL HIGH (ref 11.4–15.2)

## 2016-06-08 LAB — INFLUENZA PANEL BY PCR (TYPE A & B)
INFLAPCR: NEGATIVE
INFLBPCR: NEGATIVE

## 2016-06-08 LAB — BRAIN NATRIURETIC PEPTIDE: B NATRIURETIC PEPTIDE 5: 203.9 pg/mL — AB (ref 0.0–100.0)

## 2016-06-08 LAB — MAGNESIUM: Magnesium: 1.6 mg/dL — ABNORMAL LOW (ref 1.7–2.4)

## 2016-06-08 MED ORDER — CLONIDINE HCL 0.1 MG PO TABS
0.1000 mg | ORAL_TABLET | Freq: Two times a day (BID) | ORAL | Status: DC
  Administered 2016-06-08 – 2016-06-10 (×5): 0.1 mg via ORAL
  Filled 2016-06-08 (×7): qty 1

## 2016-06-08 MED ORDER — POTASSIUM CHLORIDE CRYS ER 20 MEQ PO TBCR
20.0000 meq | EXTENDED_RELEASE_TABLET | Freq: Every morning | ORAL | Status: DC
  Administered 2016-06-08 – 2016-06-09 (×2): 20 meq via ORAL
  Filled 2016-06-08 (×2): qty 1

## 2016-06-08 MED ORDER — HYDRALAZINE HCL 20 MG/ML IJ SOLN: 10.0000 mg | INTRAMUSCULAR | Status: DC | PRN

## 2016-06-08 MED ORDER — WARFARIN SODIUM 4 MG PO TABS
4.0000 mg | ORAL_TABLET | Freq: Once | ORAL | Status: AC
  Administered 2016-06-08: 4 mg via ORAL
  Filled 2016-06-08: qty 1

## 2016-06-08 MED ORDER — ALUM & MAG HYDROXIDE-SIMETH 200-200-20 MG/5ML PO SUSP
30.0000 mL | ORAL | Status: DC | PRN
  Administered 2016-06-08 – 2016-06-14 (×7): 30 mL via ORAL
  Filled 2016-06-08 (×7): qty 30

## 2016-06-08 MED ORDER — CLONAZEPAM 0.5 MG PO TABS
0.5000 mg | ORAL_TABLET | Freq: Every day | ORAL | Status: DC | PRN
Start: 2016-06-08 — End: 2016-06-09
  Administered 2016-06-08: 0.5 mg via ORAL
  Filled 2016-06-08: qty 1

## 2016-06-08 MED ORDER — LEVALBUTEROL HCL 0.63 MG/3ML IN NEBU
0.6300 mg | INHALATION_SOLUTION | Freq: Four times a day (QID) | RESPIRATORY_TRACT | Status: DC | PRN
  Administered 2016-06-11 – 2016-06-13 (×2): 0.63 mg via RESPIRATORY_TRACT
  Filled 2016-06-08 (×4): qty 3

## 2016-06-08 MED ORDER — CALCIUM CARBONATE ANTACID 500 MG PO CHEW
1.0000 | CHEWABLE_TABLET | Freq: Two times a day (BID) | ORAL | Status: DC | PRN
  Administered 2016-06-08: 200 mg via ORAL
  Filled 2016-06-08: qty 1

## 2016-06-08 MED ORDER — LEVALBUTEROL HCL 0.63 MG/3ML IN NEBU
0.6300 mg | INHALATION_SOLUTION | Freq: Four times a day (QID) | RESPIRATORY_TRACT | Status: DC
  Administered 2016-06-08 (×4): 0.63 mg via RESPIRATORY_TRACT
  Filled 2016-06-08 (×3): qty 3

## 2016-06-08 MED ORDER — TRIAMTERENE-HCTZ 37.5-25 MG PO TABS
1.0000 | ORAL_TABLET | Freq: Every day | ORAL | Status: DC
  Filled 2016-06-08: qty 1

## 2016-06-08 MED ORDER — IRBESARTAN 150 MG PO TABS
300.0000 mg | ORAL_TABLET | Freq: Every morning | ORAL | Status: DC
  Administered 2016-06-09: 300 mg via ORAL
  Filled 2016-06-08 (×2): qty 2

## 2016-06-08 MED ORDER — PROMETHAZINE HCL 25 MG/ML IJ SOLN
12.5000 mg | Freq: Once | INTRAMUSCULAR | Status: AC
  Administered 2016-06-08: 12.5 mg via INTRAVENOUS
  Filled 2016-06-08: qty 1

## 2016-06-08 MED ORDER — ONDANSETRON HCL 4 MG PO TABS: 4.0000 mg | ORAL_TABLET | Freq: Four times a day (QID) | ORAL | Status: DC | PRN

## 2016-06-08 MED ORDER — DOXYCYCLINE HYCLATE 100 MG IV SOLR
100.0000 mg | Freq: Two times a day (BID) | INTRAVENOUS | Status: DC
  Administered 2016-06-08 – 2016-06-09 (×4): 100 mg via INTRAVENOUS
  Filled 2016-06-08 (×4): qty 100

## 2016-06-08 MED ORDER — ALBUTEROL SULFATE (2.5 MG/3ML) 0.083% IN NEBU: 2.5000 mg | INHALATION_SOLUTION | RESPIRATORY_TRACT | Status: DC

## 2016-06-08 MED ORDER — LEVOFLOXACIN IN D5W 500 MG/100ML IV SOLN
500.0000 mg | Freq: Once | INTRAVENOUS | Status: AC
  Administered 2016-06-08: 500 mg via INTRAVENOUS
  Filled 2016-06-08: qty 100

## 2016-06-08 MED ORDER — IPRATROPIUM BROMIDE 0.02 % IN SOLN
0.5000 mg | Freq: Three times a day (TID) | RESPIRATORY_TRACT | Status: DC
  Administered 2016-06-09 – 2016-06-14 (×16): 0.5 mg via RESPIRATORY_TRACT
  Filled 2016-06-08 (×17): qty 2.5

## 2016-06-08 MED ORDER — MAGNESIUM SULFATE 2 GM/50ML IV SOLN
2.0000 g | Freq: Once | INTRAVENOUS | Status: AC
  Administered 2016-06-08: 2 g via INTRAVENOUS
  Filled 2016-06-08: qty 50

## 2016-06-08 MED ORDER — METHIMAZOLE 5 MG PO TABS
5.0000 mg | ORAL_TABLET | Freq: Two times a day (BID) | ORAL | Status: DC
  Administered 2016-06-08 – 2016-06-15 (×15): 5 mg via ORAL
  Filled 2016-06-08 (×17): qty 1

## 2016-06-08 MED ORDER — ACETAMINOPHEN 650 MG RE SUPP: 650.0000 mg | Freq: Four times a day (QID) | RECTAL | Status: DC | PRN

## 2016-06-08 MED ORDER — ALBUTEROL SULFATE (2.5 MG/3ML) 0.083% IN NEBU: 2.5000 mg | INHALATION_SOLUTION | RESPIRATORY_TRACT | Status: DC | PRN

## 2016-06-08 MED ORDER — AMLODIPINE BESYLATE 10 MG PO TABS
10.0000 mg | ORAL_TABLET | Freq: Every morning | ORAL | Status: DC
  Administered 2016-06-09 – 2016-06-14 (×6): 10 mg via ORAL
  Filled 2016-06-08 (×7): qty 1

## 2016-06-08 MED ORDER — ONDANSETRON HCL 4 MG/2ML IJ SOLN
4.0000 mg | Freq: Four times a day (QID) | INTRAMUSCULAR | Status: DC | PRN
  Administered 2016-06-08 – 2016-06-14 (×4): 4 mg via INTRAVENOUS
  Filled 2016-06-08 (×4): qty 2

## 2016-06-08 MED ORDER — METHYLPREDNISOLONE SODIUM SUCC 40 MG IJ SOLR
40.0000 mg | Freq: Every day | INTRAMUSCULAR | Status: DC
  Administered 2016-06-08 – 2016-06-09 (×2): 40 mg via INTRAVENOUS
  Filled 2016-06-08 (×2): qty 1

## 2016-06-08 MED ORDER — POTASSIUM CHLORIDE CRYS ER 20 MEQ PO TBCR
40.0000 meq | EXTENDED_RELEASE_TABLET | Freq: Once | ORAL | Status: AC
  Administered 2016-06-08: 40 meq via ORAL
  Filled 2016-06-08: qty 2

## 2016-06-08 MED ORDER — BUDESONIDE 0.25 MG/2ML IN SUSP
0.2500 mg | Freq: Two times a day (BID) | RESPIRATORY_TRACT | Status: DC
  Administered 2016-06-08 – 2016-06-14 (×14): 0.25 mg via RESPIRATORY_TRACT
  Filled 2016-06-08 (×14): qty 2

## 2016-06-08 MED ORDER — WARFARIN - PHARMACIST DOSING INPATIENT
Freq: Every day | Status: DC
  Administered 2016-06-14: 17:00:00

## 2016-06-08 MED ORDER — ALBUTEROL (5 MG/ML) CONTINUOUS INHALATION SOLN
10.0000 mg/h | INHALATION_SOLUTION | RESPIRATORY_TRACT | Status: DC
  Administered 2016-06-08: 10 mg/h via RESPIRATORY_TRACT
  Filled 2016-06-08: qty 20

## 2016-06-08 MED ORDER — IPRATROPIUM BROMIDE 0.02 % IN SOLN
0.5000 mg | RESPIRATORY_TRACT | Status: DC
  Administered 2016-06-08 (×5): 0.5 mg via RESPIRATORY_TRACT
  Filled 2016-06-08 (×5): qty 2.5

## 2016-06-08 MED ORDER — LEVALBUTEROL HCL 0.63 MG/3ML IN NEBU
0.6300 mg | INHALATION_SOLUTION | Freq: Three times a day (TID) | RESPIRATORY_TRACT | Status: DC
  Administered 2016-06-09 – 2016-06-14 (×16): 0.63 mg via RESPIRATORY_TRACT
  Filled 2016-06-08 (×17): qty 3

## 2016-06-08 MED ORDER — PRAVASTATIN SODIUM 40 MG PO TABS
40.0000 mg | ORAL_TABLET | Freq: Every morning | ORAL | Status: DC
  Administered 2016-06-08 – 2016-06-15 (×8): 40 mg via ORAL
  Filled 2016-06-08 (×8): qty 1

## 2016-06-08 MED ORDER — ACETAMINOPHEN 325 MG PO TABS
650.0000 mg | ORAL_TABLET | Freq: Four times a day (QID) | ORAL | Status: DC | PRN
  Administered 2016-06-11 – 2016-06-13 (×4): 650 mg via ORAL
  Filled 2016-06-08 (×5): qty 2

## 2016-06-08 NOTE — H&P (Signed)
History and Physical    [name]:[id number] DOB: Jun 15, 1925 DOA: 06/07/2016  PCP: [name], MD  Patient coming from: Home.  Chief Complaint: Shortness of breath.  HPI: [name] is a 83 y.o. female with history of atrial fibrillation, symptomatic bradycardia status post pacemaker placement, hypertension and hyperthyroidism presents to the ER with complaints of shortness of breath and wheezing. Patient has been having these symptoms for last 3 days. Has been having some pleuritic-type of chest pain when patient coughs. Otherwise patient is chest pain-free.   ED Course: In the ER patient on exam was mildly hypoxic with bilateral expiratory wheeze. Chest x-ray shows bronchitis changes. Has mild leukocytosis. Patient is placed on nebulizer treatment antibiotic and admitted for acute bronchitis.  Review of Systems: As per HPI, rest all negative.   Past Medical History:  Diagnosis Date  . Anxiety   . Atrial fibrillation ([place])   . Bradycardia   . Complication of anesthesia   . Difficulty sleeping   . Dysrhythmia    A FIB  . H/O hiatal hernia   . History of kidney stones   . History of skin cancer   . History of transfusion   . Hypertension   . Osteoarthritis   . Pacemaker   . Pneumonia   . PONV (postoperative nausea and vomiting)   . Shortness of breath    WITH EXERTION    Past Surgical History:  Procedure Laterality Date  . APPENDECTOMY  1960  . ARTHROPLASTY  1994   Left total knee  . ARTHROPLASTY  2000   Total right knee  . BACK SURGERY    . CHOLECYSTECTOMY    . DG SELECTED [facility] ONLY  2002   Dilation  . JOINT REPLACEMENT     knee replacement  . Microdiskectomy  05/2004   Left L4-L5  . PACEMAKER INSERTION    . TOTAL ABDOMINAL HYSTERECTOMY  1969  . TOTAL HIP ARTHROPLASTY Right 12/24/2013   Procedure: RIGHT TOTAL HIP ARTHROPLASTY ANTERIOR APPROACH;  Surgeon: Gearlean Alf, MD;  Location: [facility];  Service: Orthopedics;  Laterality:  Right;     reports that she quit smoking about 42 years ago. She has quit using smokeless tobacco. She reports that she does not drink alcohol or use drugs.  Allergies  Allergen Reactions  . Morphine And Related Nausea And Vomiting    Family History  Problem Relation Age of Onset  . Heart disease Mother   . Leukemia Father     Prior to Admission medications   Medication Sig Start Date End Date Taking? Authorizing Provider  amLODipine (NORVASC) 10 MG tablet Take 10 mg by mouth every morning.    Yes Historical Provider, MD  clonazePAM (KLONOPIN) 0.5 MG tablet Take 0.5 mg by mouth daily as needed for anxiety.  03/18/15  Yes Historical Provider, MD  cloNIDine (CATAPRES) 0.1 MG tablet Take 0.1 mg by mouth 2 (two) times daily.    Yes Historical Provider, MD  irbesartan (AVAPRO) 300 MG tablet Take 300 mg by mouth every morning.   Yes Historical Provider, MD  methimazole (TAPAZOLE) 5 MG tablet TAKE ONE TABLET TWICE DAILY WITH FOOD 10/28/15  Yes Historical Provider, MD  potassium chloride SA (K-DUR,KLOR-CON) 20 MEQ tablet Take 20 mEq by mouth every morning.    Yes Historical Provider, MD  pravastatin (PRAVACHOL) 40 MG tablet Take 40 mg by mouth every morning.    Yes Historical Provider, MD  PREVIDENT 5000 PLUS 1.1 % CREA  dental cream Take 1 Dose by mouth at bedtime. 05/25/16  Yes Historical Provider, MD  triamterene-hydrochlorothiazide (MAXZIDE-25) 37.5-25 MG tablet Take 1 tablet by mouth daily. 11/13/15  Yes Evans Lance, MD  warfarin (COUMADIN) 2 MG tablet Take 1-2 tablets (2-4 mg total) by mouth as directed. Take Coumadin for three weeks for postoperative protocol and then the patient may resume their previous Coumadin home regimen.  The dose may need to be adjusted based upon the INR.  Please follow the INR and titrate Coumadin dose for a therapeutic range between 2.0 and 3.0 INR.  After completing the three weeks of Coumadin, the patient may resume their previous Coumadin home regimen. Patient  taking differently: Take 2-4 mg by mouth daily. Take 2 tablets on Monday and then take 1 tablet all the other days 12/27/13  Yes Alexzandrew Monika Salk, PA-C    Physical Exam: Vitals:   06/08/16 0121 06/08/16 0130 06/08/16 0200 06/08/16 0230  BP: 135/78  156/63 (!) 167/54  Pulse: 70 64 94 80  Resp: 17 17 15 18   Temp:    98.5 F (36.9 C)  TempSrc:    Oral  SpO2: 92% 100% 100% 96%  Weight:    70.2 kg (154 lb 12.8 oz)  Height:    5\' 4"  (1.626 m)      Constitutional: Moderately built and nourished. Vitals:   06/08/16 0121 06/08/16 0130 06/08/16 0200 06/08/16 0230  BP: 135/78  156/63 (!) 167/54  Pulse: 70 64 94 80  Resp: 17 17 15 18   Temp:    98.5 F (36.9 C)  TempSrc:    Oral  SpO2: 92% 100% 100% 96%  Weight:    70.2 kg (154 lb 12.8 oz)  Height:    5\' 4"  (1.626 m)   Eyes: Anicteric no pallor. ENMT: No discharge from the ears eyes nose and mouth. Neck: No mass felt. No JVD appreciated. Respiratory: Bilateral expiratory wheeze and no crepitations. Cardiovascular: S1-S2 heard no murmurs appreciated. Abdomen: Soft nontender bowel sounds present. Musculoskeletal: No edema. No joint effusion. Skin: No rash. Skin appears warm. Neurologic: Alert awake oriented to time place and person. Moves all extremities. Psychiatric: Appears normal. Normal affect.   Labs on Admission: I have personally reviewed following labs and imaging studies  CBC:  Recent Labs Lab 06/07/16 2220  WBC 11.6*  NEUTROABS 9.5*  HGB 11.0*  HCT 32.5*  MCV 88.6  PLT 829   Basic Metabolic Panel:  Recent Labs Lab 06/07/16 2220  NA 134*  K 3.6  CL 100*  CO2 29  GLUCOSE 110*  BUN 20  CREATININE 1.02*  CALCIUM 9.1   GFR: Estimated Creatinine Clearance: 36.7 mL/min (by C-G formula based on SCr of 1.02 mg/dL (H)). Liver Function Tests:  Recent Labs Lab 06/07/16 2220  AST 21  ALT 14  ALKPHOS 86  BILITOT 0.5  PROT 7.0  ALBUMIN 3.8   No results for input(s): LIPASE, AMYLASE in the last 168  hours. No results for input(s): AMMONIA in the last 168 hours. Coagulation Profile:  Recent Labs Lab 06/07/16 2239  INR 1.86   Cardiac Enzymes: No results for input(s): CKTOTAL, CKMB, CKMBINDEX, TROPONINI in the last 168 hours. BNP (last 3 results) No results for input(s): PROBNP in the last 8760 hours. HbA1C: No results for input(s): HGBA1C in the last 72 hours. CBG: No results for input(s): GLUCAP in the last 168 hours. Lipid Profile: No results for input(s): CHOL, HDL, LDLCALC, TRIG, CHOLHDL, LDLDIRECT in the last 72 hours.  Thyroid Function Tests: No results for input(s): TSH, T4TOTAL, FREET4, T3FREE, THYROIDAB in the last 72 hours. Anemia Panel: No results for input(s): VITAMINB12, FOLATE, FERRITIN, TIBC, IRON, RETICCTPCT in the last 72 hours. Urine analysis:    Component Value Date/Time   COLORURINE YELLOW 05/13/2015 2000   APPEARANCEUR CLEAR 05/13/2015 2000   LABSPEC 1.008 05/13/2015 2000   PHURINE 7.5 05/13/2015 2000   GLUCOSEU NEGATIVE 05/13/2015 2000   HGBUR NEGATIVE 05/13/2015 2000   BILIRUBINUR NEGATIVE 05/13/2015 2000   KETONESUR NEGATIVE 05/13/2015 2000   PROTEINUR NEGATIVE 05/13/2015 2000   UROBILINOGEN 0.2 11/12/2014 1331   NITRITE NEGATIVE 05/13/2015 2000   LEUKOCYTESUR SMALL (A) 05/13/2015 2000   Sepsis Labs: @LABRCNTIP (procalcitonin:4,lacticidven:4) )No results found for this or any previous visit (from the past 240 hour(s)).   Radiological Exams on Admission: Dg Chest 2 View  Result Date: 06/07/2016 CLINICAL DATA:  Wheezing, SOB, congestion. Worse today. Pacemaker placed 2007. Non smoker EXAM: CHEST  2 VIEW COMPARISON:  05/13/2015 FINDINGS: Cardiac pacemaker. Linear fibrosis in the left lung base is unchanged since prior study. Slight fibrosis also demonstrated in the right lung base, unchanged. Peribronchial thickening centrally suggesting chronic bronchitis. No focal airspace disease or consolidation. Normal heart size and pulmonary vascularity.  Calcified and tortuous aorta. Degenerative changes in the spine. IMPRESSION: Chronic bronchitic changes in chronic scarring in the lung bases. No evidence of active pulmonary disease. Electronically Signed   By: Lucienne Capers M.D.   On: 06/07/2016 22:02    EKG: Independently reviewed. Paced rhythm.  Assessment/Plan Active Problems:   Essential hypertension   ATRIAL FIBRILLATION   Fall   Acute bronchitis    1. Acute bronchitis - on exam patient is still wheezing. I placed patient on IV Solu-Medrol and doxycycline and since patient gets little tremulous on albuterol I placed patient on Xopenex nebulizer with Pulmicort. Follow influenza PCR. Will check troponin since patient has atypical chest pain. 2. Chronic atrial fibrillation - Coumadin will be dosed per pharmacy. Amlodipine clonidine triamterene. 3. Hypertension uncontrolled - patient is on ARB diuretics amlodipine and clonidine. Closely follow blood pressure trends. I have also placed patient on when necessary IV hydralazine. 4. Hyperthyroidism on Tapazole. 5. History of symptomatic bradycardia status post pacemaker placement.   DVT prophylaxis: Coumadin. Code Status: Full code.  Family Communication: Discussed with patient.  Disposition Plan: Home.  Consults called: None.  Admission status: Observation.    Rise Patience MD [facility] Pager [phone].  If 7PM-7AM, please contact night-coverage [email or web address] Password [facility]  06/08/2016, 3:27 AM

## 2016-06-08 NOTE — Progress Notes (Signed)
ANTICOAGULATION CONSULT NOTE - Initial Consult  Pharmacy Consult for Warfarin Indication: atrial fibrillation  Allergies  Allergen Reactions  . Morphine And Related Nausea And Vomiting    Patient Measurements: Height: 5\' 4"  (162.6 cm) Weight: 154 lb 12.8 oz (70.2 kg) IBW/kg (Calculated) : 54.7 Heparin Dosing Weight:   Vital Signs: Temp: 98.5 F (36.9 C) (03/13 0230) Temp Source: Oral (03/13 0230) BP: 167/54 (03/13 0230) Pulse Rate: 80 (03/13 0230)  Labs:  Recent Labs  06/07/16 2220 06/07/16 2239  HGB 11.0*  --   HCT 32.5*  --   PLT 258  --   LABPROT  --  21.7*  INR  --  1.86  CREATININE 1.02*  --     Estimated Creatinine Clearance: 36.7 mL/min (by C-G formula based on SCr of 1.02 mg/dL (H)).   Medical History: Past Medical History:  Diagnosis Date  . Anxiety   . Atrial fibrillation ([place])   . Bradycardia   . Complication of anesthesia   . Difficulty sleeping   . Dysrhythmia    A FIB  . H/O hiatal hernia   . History of kidney stones   . History of skin cancer   . History of transfusion   . Hypertension   . Osteoarthritis   . Pacemaker   . Pneumonia   . PONV (postoperative nausea and vomiting)   . Shortness of breath    WITH EXERTION    Medications:  Prescriptions Prior to Admission  Medication Sig Dispense Refill Last Dose  . amLODipine (NORVASC) 10 MG tablet Take 10 mg by mouth every morning.    06/07/2016 at Unknown time  . clonazePAM (KLONOPIN) 0.5 MG tablet Take 0.5 mg by mouth daily as needed for anxiety.    unknown  . cloNIDine (CATAPRES) 0.1 MG tablet Take 0.1 mg by mouth 2 (two) times daily.    06/07/2016 at Unknown time  . irbesartan (AVAPRO) 300 MG tablet Take 300 mg by mouth every morning.   06/07/2016 at Unknown time  . methimazole (TAPAZOLE) 5 MG tablet TAKE ONE TABLET TWICE DAILY WITH FOOD  0 06/07/2016 at Unknown time  . potassium chloride SA (K-DUR,KLOR-CON) 20 MEQ tablet Take 20 mEq by mouth every morning.    06/07/2016 at Unknown time   . pravastatin (PRAVACHOL) 40 MG tablet Take 40 mg by mouth every morning.    06/07/2016 at Unknown time  . PREVIDENT 5000 PLUS 1.1 % CREA dental cream Take 1 Dose by mouth at bedtime.   06/07/2016 at Unknown time  . triamterene-hydrochlorothiazide (MAXZIDE-25) 37.5-25 MG tablet Take 1 tablet by mouth daily. 30 tablet 11 06/07/2016 at Unknown time  . warfarin (COUMADIN) 2 MG tablet Take 1-2 tablets (2-4 mg total) by mouth as directed. Take Coumadin for three weeks for postoperative protocol and then the patient may resume their previous Coumadin home regimen.  The dose may need to be adjusted based upon the INR.  Please follow the INR and titrate Coumadin dose for a therapeutic range between 2.0 and 3.0 INR.  After completing the three weeks of Coumadin, the patient may resume their previous Coumadin home regimen. (Patient taking differently: Take 2-4 mg by mouth daily. Take 2 tablets on Monday and then take 1 tablet all the other days) 30 tablet 0 06/07/2016 at 0900   Scheduled:  . amLODipine  10 mg Oral q morning - 10a  . budesonide (PULMICORT) nebulizer solution  0.25 mg Nebulization BID  . cloNIDine  0.1 mg Oral BID  . doxycycline (  VIBRAMYCIN) IV  100 mg Intravenous BID  . ipratropium  0.5 mg Nebulization Q4H  . irbesartan  300 mg Oral q morning - 10a  . levalbuterol  0.63 mg Nebulization Q6H  . methimazole  5 mg Oral BID  . methylPREDNISolone (SOLU-MEDROL) injection  40 mg Intravenous Daily  . potassium chloride SA  20 mEq Oral q morning - 10a  . pravastatin  40 mg Oral q morning - 10a  . triamterene-hydrochlorothiazide  1 tablet Oral Daily  . warfarin  4 mg Oral Once  . Warfarin - Pharmacist Dosing Inpatient   Does not apply q1800    Assessment: Patient with afib and chronic warfarin use.  INR < 2 on admit.  Last dose of warfarin noted 3/12 at 0900.  Goal of Therapy:  INR 2-3    Plan:  Daily INR Warfarin 4mg  po x1 at 1000.  Tyler Deis, Shea Stakes [place] 06/08/2016,4:07 AM

## 2016-06-08 NOTE — Progress Notes (Signed)
[facility]  Patient admitted by my collegue this AM. Chart reviewed and plan discussed with patient  Exam: mild wheezing in left lung field  Principal Problem:   Acute bronchitis - no h/o COPD or asthma - cont Pulmicort, Doxy, Xopenex, Atrovent, current dose of Solumedrol - likely home tomorrow  Active Problems:   Essential hypertension - cont norvasc, Colonidine, Avapro - Cr up a little - hold Maxzide for tomorrow    ATRIAL FIBRILLATION -  Cardiac pacemaker in situ for bradycardia - coumadin per pharmacy- INR 2.55 - V paced  Hypokalemia -   checked Mg which is also low- replace both  Hyperthyroid - Tapazol  Debbe Odea, MD Pager: [email or web address]

## 2016-06-08 NOTE — Care Management Obs Status (Signed)
[facility] NOTIFICATION   Patient Details  Name: [name] MRN: [id number] Date of Birth: 20-Oct-1925   Medicare Observation Status Notification Given:  Yes    Leeroy Cha, RN 06/08/2016, 4:07 PM

## 2016-06-08 NOTE — Care Management Note (Signed)
Case Management Note  Patient Details  Name: [name] MRN: [id number] Date of Birth: [birth year]  Subjective/Objective:     Early sepsis               Action/Plan:  Lives at home alone. Date:  June 08, 2016 Chart reviewed for concurrent status and case management needs. Will continue to follow patient progress. Discharge Planning: following for needs Expected discharge date: [id number] Velva Harman, BSN, [place], [place]   Expected Discharge Date:                  Expected Discharge Plan:  Home/Self Care  In-House Referral:     Discharge planning Services     Post Acute Care Choice:    Choice offered to:     DME Arranged:    DME Agency:     HH Arranged:    [place] Agency:     Status of Service:  In process, will continue to follow  If discussed at Long Length of Stay Meetings, dates discussed:    Additional Comments:  Leeroy Cha, RN 06/08/2016, 10:25 AM

## 2016-06-09 DIAGNOSIS — I4891 Unspecified atrial fibrillation: Secondary | ICD-10-CM

## 2016-06-09 DIAGNOSIS — J209 Acute bronchitis, unspecified: Principal | ICD-10-CM

## 2016-06-09 LAB — PROTIME-INR
INR: 2.63
PROTHROMBIN TIME: 28.6 s — AB (ref 11.4–15.2)

## 2016-06-09 MED ORDER — CLONAZEPAM 0.5 MG PO TABS
0.2500 mg | ORAL_TABLET | Freq: Every day | ORAL | Status: DC | PRN
  Administered 2016-06-12 – 2016-06-15 (×4): 0.25 mg via ORAL
  Filled 2016-06-09 (×4): qty 1

## 2016-06-09 MED ORDER — LORAZEPAM 2 MG/ML IJ SOLN
0.5000 mg | Freq: Once | INTRAMUSCULAR | Status: AC
  Administered 2016-06-09: 0.5 mg via INTRAVENOUS
  Filled 2016-06-09: qty 1

## 2016-06-09 MED ORDER — PANTOPRAZOLE SODIUM 40 MG PO TBEC
40.0000 mg | DELAYED_RELEASE_TABLET | Freq: Every day | ORAL | Status: DC
  Administered 2016-06-09 – 2016-06-15 (×7): 40 mg via ORAL
  Filled 2016-06-09 (×7): qty 1

## 2016-06-09 MED ORDER — POTASSIUM CHLORIDE CRYS ER 20 MEQ PO TBCR
40.0000 meq | EXTENDED_RELEASE_TABLET | Freq: Once | ORAL | Status: AC
  Administered 2016-06-09: 40 meq via ORAL
  Filled 2016-06-09: qty 2

## 2016-06-09 MED ORDER — DOXYCYCLINE HYCLATE 100 MG PO TABS
100.0000 mg | ORAL_TABLET | Freq: Two times a day (BID) | ORAL | Status: DC
  Administered 2016-06-09 – 2016-06-10 (×3): 100 mg via ORAL
  Filled 2016-06-09 (×3): qty 1

## 2016-06-09 MED ORDER — METHYLPREDNISOLONE SODIUM SUCC 40 MG IJ SOLR
40.0000 mg | Freq: Two times a day (BID) | INTRAMUSCULAR | Status: DC
  Administered 2016-06-09 – 2016-06-10 (×2): 40 mg via INTRAVENOUS
  Filled 2016-06-09 (×2): qty 1

## 2016-06-09 MED ORDER — WARFARIN SODIUM 2 MG PO TABS
2.0000 mg | ORAL_TABLET | Freq: Once | ORAL | Status: AC
Start: 2016-06-09 — End: 2016-06-09
  Administered 2016-06-09: 2 mg via ORAL
  Filled 2016-06-09: qty 1

## 2016-06-09 NOTE — Progress Notes (Addendum)
Pt c/o unrelieved nausea, severe reflux/heartburn, insomnia, and is visibly anxious, medications administered, see MAR. Patient states she "doesn't feel good" and is "having a bad night" related to these symptoms but is appreciative of all the staff's assistance.

## 2016-06-09 NOTE — Progress Notes (Signed)
PHARMACIST - PHYSICIAN COMMUNICATION DR:   Elgergawy CONCERNING: Antibiotic IV to Oral Route Change Policy  RECOMMENDATION: This patient is receiving Doxycycline 100 mg by the intravenous route.  Based on criteria approved by the Pharmacy and Therapeutics Committee, the antibiotic(s) is/are being converted to the equivalent oral dose form(s).   DESCRIPTION: These criteria include:  Patient being treated for a respiratory tract infection, urinary tract infection, cellulitis or clostridium difficile associated diarrhea if on metronidazole  The patient is not neutropenic and does not exhibit a GI malabsorption state  The patient is eating (either orally or via tube) and/or has been taking other orally administered medications for a least 24 hours  The patient is improving clinically and has a Tmax < 100.5  If you have questions about this conversion, please contact the Pharmacy Department  []   [phone] )  [facility] []   [phone] )  [facility]  []   [phone] )  [facility] [x]   [phone] )  [facility]

## 2016-06-09 NOTE — Progress Notes (Signed)
   06/09/16 0325  What Happened  Was fall witnessed? No  Was patient injured? No  Patient found on floor  Found by Staff-comment  Follow Up  MD notified Schorr  Time MD notified 73  Family notified Yes-comment (sons)  Time family notified [phone]  Additional tests No  Adult Fall Risk Assessment  Risk Factor Category (scoring not indicated) Fall has occurred during this admission (document High fall risk)  Patient's Fall Risk High Fall Risk (>13 points)  Adult Fall Risk Interventions  Required Bundle Interventions *See Row Information* High fall risk - low, moderate, and high requirements implemented  Screening for Fall Injury Risk  Risk For Fall Injury- See Row Information  Nurse judgement  Injury Prevention Interventions Family supervision  Vitals  Temp 97.9 F (36.6 C)  Temp Source Oral  BP 129/72  BP Location Right Arm  BP Method Automatic  Patient Position (if appropriate) Lying  Pulse Rate 65  Pulse Rate Source Dinamap  Resp 20  Oxygen Therapy  SpO2 100 %  O2 Device Room Air  Pain Assessment  Pain Assessment No/denies pain  Pain Score 0  PCA/Epidural/Spinal Assessment  Respiratory Pattern Regular;Unlabored;Dyspnea with exertion  Neurological  Neuro (WDL) X  Level of Consciousness Alert  Orientation Level Oriented X4  Cognition Follows commands  Speech Clear  Pupil Assessment  No  Neuro Symptoms Forgetful  Neuro Additional Assessments No  Neuro Additional Assessments No  Musculoskeletal  Musculoskeletal (WDL) X  Assistive Device Cane  Generalized Weakness Yes  Weight Bearing Restrictions No  Integumentary  Integumentary (WDL) WDL  Skin Color Appropriate for ethnicity  Skin Condition Dry  Skin Integrity Intact  Skin Turgor Non-tenting

## 2016-06-09 NOTE — Progress Notes (Signed)
ANTICOAGULATION CONSULT NOTE - Follow Up Consult  Pharmacy Consult for Warfarin Indication: atrial fibrillation  Allergies  Allergen Reactions  . Morphine And Related Nausea And Vomiting    Patient Measurements: Height: 5\' 4"  (162.6 cm) Weight: 154 lb 12.8 oz (70.2 kg) IBW/kg (Calculated) : 54.7  Vital Signs: Temp: 98.1 F (36.7 C) (03/14 0617) Temp Source: Oral (03/14 0617) BP: 115/51 (03/14 0939) Pulse Rate: 60 (03/14 0939)  Labs:  Recent Labs  06/07/16 2220 06/07/16 2239 06/08/16 0625 06/08/16 1242 06/08/16 1712 06/09/16 0553  HGB 11.0*  --  10.9*  --   --   --   HCT 32.5*  --  32.7*  --   --   --   PLT 258  --  252  --   --   --   LABPROT  --  21.7* 27.9*  --   --  28.6*  INR  --  1.86 2.55  --   --  2.63  CREATININE 1.02*  --  1.26*  --   --   --   TROPONINI  --   --  <0.03 <0.03 <0.03  --     Estimated Creatinine Clearance: 29.7 mL/min (by C-G formula based on SCr of 1.26 mg/dL (H)).   Assessment: Patient with afib and chronic warfarin use, admitted with acute bronchitis. Continuing warfarin inpatient.  Today, INR therapeutic.  Received 4 mg dose yesterday.  Home dose reported as 4 mg on Mondays and 2 mg daily every other day.    Goal of Therapy:  INR 2-3   Plan:  Warfarin 2 mg today. Daily INR.  Hershal Coria 06/09/2016,12:17 PM

## 2016-06-09 NOTE — Progress Notes (Signed)
PROGRESS NOTE                                                                                                                                                                                                             Patient Demographics:    [name], is a 83 y.o. female, DOB - December 30, 1925, JKK:[id number]  Admit date - 06/07/2016   Admitting Physician Rise Patience, MD  Outpatient Primary MD for the patient is [name], MD  LOS - 1   Chief Complaint  Patient presents with  . Shortness of Breath       Brief Narrative   83 y.o. female with history of atrial fibrillation, symptomatic bradycardia status post pacemaker placement, hypertension and hyperthyroidism presents to the ER with complaints of shortness of breath and wheezing, she was admitted for acute bronchitis  Subjective:    [name] today has, No headache, No chest pain, No abdominal pain - No Nausea,Less of cough, which is tried, as well she had a fall at night.  Assessment  & Plan :    Principal Problem:   Acute bronchitis Active Problems:   Essential hypertension   ATRIAL FIBRILLATION   Cardiac pacemaker in situ   Fall   Acute bronchitis - no h/o COPD or asthma, she is still with significant wheezing, will increase IV Solu-Medrol to 40 mg twice a day. - cont Pulmicort, Doxy, Xopenex, Atrovent. - likely home tomorrow  Essential hypertension - cont norvasc, Colonidine, Avapro - Check creatinine in a.m., if stable resume Maxzide   ATRIAL FIBRILLATION -  Cardiac pacemaker in situ for bradycardia - coumadin per pharmacy- INR 2.55 - V paced  Hypokalemia/hypomagnesemia - Repleted, recheck in a.m.  Hyperthyroid - Tapazol  History of symptomatic bradycardia status post pacemaker placement.    Code Status : Full  Family Communication  : None at bedside  Disposition Plan  : pending PT evaluation  Consults  :  None  Procedures  :  None  DVT Prophylaxis  :  On warfarin  Lab Results  Component Value Date   PLT 252 06/08/2016    Antibiotics  :    Anti-infectives    Start     Dose/Rate Route Frequency Ordered Stop   06/09/16 2200  doxycycline (VIBRA-TABS) tablet 100 mg     100 mg Oral Every  12 hours 06/09/16 1100     06/08/16 0345  doxycycline (VIBRAMYCIN) 100 mg in dextrose 5 % 250 mL IVPB  Status:  Discontinued     100 mg 125 mL/hr over 120 Minutes Intravenous 2 times daily 06/08/16 0327 06/09/16 1100   06/08/16 0115  levofloxacin (LEVAQUIN) IVPB 500 mg     500 mg 100 mL/hr over 60 Minutes Intravenous  Once 06/08/16 0105 06/08/16 0231        Objective:   Vitals:   06/09/16 0817 06/09/16 0939 06/09/16 1312 06/09/16 1400  BP:  (!) 115/51  (!) 133/48  Pulse:  60  60  Resp:  16  16  Temp:    97.6 F (36.4 C)  TempSrc:    Oral  SpO2: 90% 95% 91% 97%  Weight:      Height:        Wt Readings from Last 3 Encounters:  06/08/16 70.2 kg (154 lb 12.8 oz)  09/24/15 69.4 kg (153 lb)  05/29/15 70.3 kg (155 lb)     Intake/Output Summary (Last 24 hours) at 06/09/16 1423 Last data filed at 06/09/16 1200  Gross per 24 hour  Intake              840 ml  Output                0 ml  Net              840 ml     Physical Exam  Awake Alert, Oriented X 3, No new F.N deficits, Normal affect Supple Neck,No JVD,  Symmetrical Chest wall movement, Decreased air movement bilaterally, diffuse wheezing RRR,No Gallops,Rubs or new Murmurs, No Parasternal Heave +ve B.Sounds, Abd Soft, No tenderness,  No rebound - guarding or rigidity. No Cyanosis, Clubbing or edema, No new Rash or bruise     Data Review:    CBC  Recent Labs Lab 06/07/16 2220 06/08/16 0625  WBC 11.6* 12.1*  HGB 11.0* 10.9*  HCT 32.5* 32.7*  PLT 258 252  MCV 88.6 89.3  MCH 30.0 29.8  MCHC 33.8 33.3  RDW 13.4 13.6  LYMPHSABS 1.2 0.3*  MONOABS 0.8 0.1  EOSABS 0.1 0.0  BASOSABS 0.0 0.0    Chemistries   Recent Labs Lab  06/07/16 2220 06/08/16 0625  NA 134* 133*  K 3.6 3.0*  CL 100* 98*  CO2 29 21*  GLUCOSE 110* 267*  BUN 20 23*  CREATININE 1.02* 1.26*  CALCIUM 9.1 9.1  MG  --  1.6*  AST 21 31  ALT 14 14  ALKPHOS 86 77  BILITOT 0.5 0.7   ------------------------------------------------------------------------------------------------------------------ No results for input(s): CHOL, HDL, LDLCALC, TRIG, CHOLHDL, LDLDIRECT in the last 72 hours.  No results found for: HGBA1C ------------------------------------------------------------------------------------------------------------------ No results for input(s): TSH, T4TOTAL, T3FREE, THYROIDAB in the last 72 hours.  Invalid input(s): FREET3 ------------------------------------------------------------------------------------------------------------------ No results for input(s): VITAMINB12, FOLATE, FERRITIN, TIBC, IRON, RETICCTPCT in the last 72 hours.  Coagulation profile  Recent Labs Lab 06/07/16 2239 06/08/16 0625 06/09/16 0553  INR 1.86 2.55 2.63    No results for input(s): DDIMER in the last 72 hours.  Cardiac Enzymes  Recent Labs Lab 06/08/16 0625 06/08/16 1242 06/08/16 1712  TROPONINI <0.03 <0.03 <0.03   ------------------------------------------------------------------------------------------------------------------    Component Value Date/Time   BNP 203.9 (H) 06/07/2016 2220    Inpatient Medications  Scheduled Meds: . amLODipine  10 mg Oral q morning - 10a  . budesonide (PULMICORT) nebulizer solution  0.25 mg Nebulization  BID  . cloNIDine  0.1 mg Oral BID  . doxycycline  100 mg Oral Q12H  . ipratropium  0.5 mg Nebulization TID  . irbesartan  300 mg Oral q morning - 10a  . levalbuterol  0.63 mg Nebulization TID  . methimazole  5 mg Oral BID  . methylPREDNISolone (SOLU-MEDROL) injection  40 mg Intravenous Q12H  . potassium chloride SA  20 mEq Oral q morning - 10a  . pravastatin  40 mg Oral q morning - 10a  .  warfarin  2 mg Oral ONCE-1800  . Warfarin - Pharmacist Dosing Inpatient   Does not apply q1800   Continuous Infusions: PRN Meds:.acetaminophen **OR** acetaminophen, alum & mag hydroxide-simeth, calcium carbonate, clonazePAM, hydrALAZINE, levalbuterol, ondansetron **OR** ondansetron (ZOFRAN) IV  Micro Results No results found for this or any previous visit (from the past 240 hour(s)).  Radiology Reports Dg Chest 2 View  Result Date: 06/07/2016 CLINICAL DATA:  Wheezing, SOB, congestion. Worse today. Pacemaker placed 2007. Non smoker EXAM: CHEST  2 VIEW COMPARISON:  05/13/2015 FINDINGS: Cardiac pacemaker. Linear fibrosis in the left lung base is unchanged since prior study. Slight fibrosis also demonstrated in the right lung base, unchanged. Peribronchial thickening centrally suggesting chronic bronchitis. No focal airspace disease or consolidation. Normal heart size and pulmonary vascularity. Calcified and tortuous aorta. Degenerative changes in the spine. IMPRESSION: Chronic bronchitic changes in chronic scarring in the lung bases. No evidence of active pulmonary disease. Electronically Signed   By: Lucienne Capers M.D.   On: 06/07/2016 22:02     [facility], Olimpia Tinch M.D on 06/09/2016 at 2:23 PM  Between 7am to 7pm - Pager - [phone]  After 7pm go to [email or web address] - password [facility]  [facility] -  Office  [phone]

## 2016-06-10 LAB — BASIC METABOLIC PANEL
Anion gap: 8 (ref 5–15)
Anion gap: 8 (ref 5–15)
BUN: 32 mg/dL — AB (ref 6–20)
BUN: 29 mg/dL — ABNORMAL HIGH (ref 6–20)
CALCIUM: 8.6 mg/dL — AB (ref 8.9–10.3)
CALCIUM: 8.6 mg/dL — AB (ref 8.9–10.3)
CHLORIDE: 96 mmol/L — AB (ref 101–111)
CHLORIDE: 95 mmol/L — AB (ref 101–111)
CO2: 23 mmol/L (ref 22–32)
CO2: 22 mmol/L (ref 22–32)
CREATININE: 1.08 mg/dL — AB (ref 0.44–1.00)
Creatinine, Ser: 1.12 mg/dL — ABNORMAL HIGH (ref 0.44–1.00)
GFR calc Af Amer: 52 mL/min — ABNORMAL LOW (ref >60)
GFR calc Af Amer: 49 mL/min — ABNORMAL LOW (ref >60)
GFR calc non Af Amer: 44 mL/min — ABNORMAL LOW (ref >60)
GFR calc non Af Amer: 43 mL/min — ABNORMAL LOW (ref >60)
GLUCOSE: 131 mg/dL — AB (ref 65–99)
GLUCOSE: 153 mg/dL — AB (ref 65–99)
Potassium: 5.7 mmol/L — ABNORMAL HIGH (ref 3.5–5.1)
Potassium: 4.6 mmol/L (ref 3.5–5.1)
Sodium: 127 mmol/L — ABNORMAL LOW (ref 135–145)
Sodium: 125 mmol/L — ABNORMAL LOW (ref 135–145)

## 2016-06-10 LAB — PROTIME-INR
INR: 3.44
Prothrombin Time: 35.5 seconds — ABNORMAL HIGH (ref 11.4–15.2)

## 2016-06-10 MED ORDER — TRIAMTERENE-HCTZ 37.5-25 MG PO TABS
1.0000 | ORAL_TABLET | Freq: Every day | ORAL | Status: DC
  Administered 2016-06-10: 1 via ORAL
  Filled 2016-06-10 (×2): qty 1

## 2016-06-10 MED ORDER — SODIUM CHLORIDE 1 G PO TABS
2.0000 g | ORAL_TABLET | Freq: Once | ORAL | Status: AC
  Administered 2016-06-10: 2 g via ORAL
  Filled 2016-06-10: qty 2

## 2016-06-10 MED ORDER — GUAIFENESIN ER 600 MG PO TB12
1200.0000 mg | ORAL_TABLET | Freq: Two times a day (BID) | ORAL | Status: DC
  Administered 2016-06-10 – 2016-06-15 (×11): 1200 mg via ORAL
  Filled 2016-06-10 (×11): qty 2

## 2016-06-10 MED ORDER — SODIUM CHLORIDE 0.9 % IV SOLN
INTRAVENOUS | Status: DC
  Administered 2016-06-10 (×2): via INTRAVENOUS

## 2016-06-10 MED ORDER — PREDNISONE 10 MG PO TABS
10.0000 mg | ORAL_TABLET | Freq: Every day | ORAL | Status: DC
  Administered 2016-06-10: 10 mg via ORAL
  Filled 2016-06-10: qty 1

## 2016-06-10 MED ORDER — HYDRALAZINE HCL 25 MG PO TABS
25.0000 mg | ORAL_TABLET | Freq: Four times a day (QID) | ORAL | Status: DC
  Administered 2016-06-10 – 2016-06-11 (×3): 25 mg via ORAL
  Filled 2016-06-10 (×3): qty 1

## 2016-06-10 NOTE — Progress Notes (Addendum)
PROGRESS NOTE                                                                                                                                                                                                             Patient Demographics:    [name], is a 83 y.o. female, DOB - 1925/08/22, MEQ:[id number]  Admit date - 06/07/2016   Admitting Physician Rise Patience, MD  Outpatient Primary MD for the patient is [name], MD  LOS - 2   Chief Complaint  Patient presents with  . Shortness of Breath       Brief Narrative   83 y.o. female with history of atrial fibrillation, symptomatic bradycardia status post pacemaker placement, hypertension and hyperthyroidism presents to the ER with complaints of shortness of breath and wheezing, she was admitted for acute bronchitis  Subjective:    [name] today has, No headache, No chest pain, No abdominal pain - No Nausea,Less of cough, which is tried, as well she had a fall at night.  Assessment  & Plan :    Principal Problem:   Acute bronchitis Active Problems:   Essential hypertension   ATRIAL FIBRILLATION   Cardiac pacemaker in situ   Fall   Acute bronchitis - no h/o COPD or asthma, Presents with cough, significant wheezing, no evidence of active pulmonary disease and chest x-ray, this is most likely acute bronchitis with bronchospasm, continue with doxycycline, significant wheezing initially, on IV steroids, we'll transition to by mouth prednisone today . - We'll start on Mucinex and flutter valve - cont Pulmicort, Doxy, Xopenex, Atrovent.  Essential hypertension - cont norvasc, Colonidine, Holding Avapro today giving hyperkalemia, Maxzide on hold given worsening renal function, will start on hydralazine   ATRIAL FIBRILLATION -  Cardiac pacemaker in situ for bradycardia - coumadin per pharmacy - V paced  Hypokalemia/hypomagnesemia - Repleted, Actually today with  hyperkalemia, will stop supplement, will stop of Avapro, and will recheck level this afternoon.  Hyponatremia - No evidence of volume overload, will start on IV normal saline  Hyperthyroid - Tapazol  History of symptomatic bradycardia status post pacemaker placement.    Code Status : Full  Family Communication  : None at bedside  Disposition Plan  : pending PT evaluation  Consults  :  None  Procedures  : None  DVT  Prophylaxis  :  On warfarin  Lab Results  Component Value Date   PLT 252 06/08/2016    Antibiotics  :    Anti-infectives    Start     Dose/Rate Route Frequency Ordered Stop   06/09/16 2200  doxycycline (VIBRA-TABS) tablet 100 mg     100 mg Oral Every 12 hours 06/09/16 1100     06/08/16 0345  doxycycline (VIBRAMYCIN) 100 mg in dextrose 5 % 250 mL IVPB  Status:  Discontinued     100 mg 125 mL/hr over 120 Minutes Intravenous 2 times daily 06/08/16 0327 06/09/16 1100   06/08/16 0115  levofloxacin (LEVAQUIN) IVPB 500 mg     500 mg 100 mL/hr over 60 Minutes Intravenous  Once 06/08/16 0105 06/08/16 0231        Objective:   Vitals:   06/10/16 0746 06/10/16 0747 06/10/16 1055 06/10/16 1359  BP:   (!) 154/71 (!) 161/66  Pulse:   (!) 59 (!) 59  Resp:    17  Temp:    97.2 F (36.2 C)  TempSrc:    Axillary  SpO2: 91% 91%  100%  Weight:      Height:        Wt Readings from Last 3 Encounters:  06/08/16 70.2 kg (154 lb 12.8 oz)  09/24/15 69.4 kg (153 lb)  05/29/15 70.3 kg (155 lb)     Intake/Output Summary (Last 24 hours) at 06/10/16 1417 Last data filed at 06/09/16 1846  Gross per 24 hour  Intake              240 ml  Output                0 ml  Net              240 ml     Physical Exam  Awake Alert, Oriented X 3, No new F.N deficits, Normal affect Supple Neck,No JVD,  Symmetrical Chest wall movement,Improved air movement bilaterally, some mild wheezing, but significantly subsided since yesterday RRR,No Gallops,Rubs or new Murmurs, No  Parasternal Heave +ve B.Sounds, Abd Soft, No tenderness,  No rebound - guarding or rigidity. No Cyanosis, Clubbing or edema, No new Rash or bruise     Data Review:    CBC  Recent Labs Lab 06/07/16 2220 06/08/16 0625  WBC 11.6* 12.1*  HGB 11.0* 10.9*  HCT 32.5* 32.7*  PLT 258 252  MCV 88.6 89.3  MCH 30.0 29.8  MCHC 33.8 33.3  RDW 13.4 13.6  LYMPHSABS 1.2 0.3*  MONOABS 0.8 0.1  EOSABS 0.1 0.0  BASOSABS 0.0 0.0    Chemistries   Recent Labs Lab 06/07/16 2220 06/08/16 0625 06/10/16 0547  NA 134* 133* 127*  K 3.6 3.0* 5.7*  CL 100* 98* 96*  CO2 29 21* 23  GLUCOSE 110* 267* 131*  BUN 20 23* 32*  CREATININE 1.02* 1.26* 1.08*  CALCIUM 9.1 9.1 8.6*  MG  --  1.6*  --   AST 21 31  --   ALT 14 14  --   ALKPHOS 86 77  --   BILITOT 0.5 0.7  --    ------------------------------------------------------------------------------------------------------------------ No results for input(s): CHOL, HDL, LDLCALC, TRIG, CHOLHDL, LDLDIRECT in the last 72 hours.  No results found for: HGBA1C ------------------------------------------------------------------------------------------------------------------ No results for input(s): TSH, T4TOTAL, T3FREE, THYROIDAB in the last 72 hours.  Invalid input(s): FREET3 ------------------------------------------------------------------------------------------------------------------ No results for input(s): VITAMINB12, FOLATE, FERRITIN, TIBC, IRON, RETICCTPCT in the last 72 hours.  Coagulation  profile  Recent Labs Lab 06/07/16 2239 06/08/16 0625 06/09/16 0553 06/10/16 0547  INR 1.86 2.55 2.63 3.44    No results for input(s): DDIMER in the last 72 hours.  Cardiac Enzymes  Recent Labs Lab 06/08/16 0625 06/08/16 1242 06/08/16 1712  TROPONINI <0.03 <0.03 <0.03   ------------------------------------------------------------------------------------------------------------------    Component Value Date/Time   BNP 203.9 (H)  06/07/2016 2220    Inpatient Medications  Scheduled Meds: . amLODipine  10 mg Oral q morning - 10a  . budesonide (PULMICORT) nebulizer solution  0.25 mg Nebulization BID  . cloNIDine  0.1 mg Oral BID  . doxycycline  100 mg Oral Q12H  . ipratropium  0.5 mg Nebulization TID  . levalbuterol  0.63 mg Nebulization TID  . methimazole  5 mg Oral BID  . methylPREDNISolone (SOLU-MEDROL) injection  40 mg Intravenous Q12H  . pantoprazole  40 mg Oral Daily  . pravastatin  40 mg Oral q morning - 10a  . Warfarin - Pharmacist Dosing Inpatient   Does not apply q1800   Continuous Infusions: PRN Meds:.acetaminophen **OR** acetaminophen, alum & mag hydroxide-simeth, calcium carbonate, clonazePAM, hydrALAZINE, levalbuterol, ondansetron **OR** ondansetron (ZOFRAN) IV  Micro Results No results found for this or any previous visit (from the past 240 hour(s)).  Radiology Reports Dg Chest 2 View  Result Date: 06/07/2016 CLINICAL DATA:  Wheezing, SOB, congestion. Worse today. Pacemaker placed 2007. Non smoker EXAM: CHEST  2 VIEW COMPARISON:  05/13/2015 FINDINGS: Cardiac pacemaker. Linear fibrosis in the left lung base is unchanged since prior study. Slight fibrosis also demonstrated in the right lung base, unchanged. Peribronchial thickening centrally suggesting chronic bronchitis. No focal airspace disease or consolidation. Normal heart size and pulmonary vascularity. Calcified and tortuous aorta. Degenerative changes in the spine. IMPRESSION: Chronic bronchitic changes in chronic scarring in the lung bases. No evidence of active pulmonary disease. Electronically Signed   By: Lucienne Capers M.D.   On: 06/07/2016 22:02     [facility], Meliton Samad M.D on 06/10/2016 at 2:17 PM  Between 7am to 7pm - Pager - [phone]  After 7pm go to [email or web address] - password [facility]  [facility] -  Office  [phone]

## 2016-06-10 NOTE — Evaluation (Signed)
Physical Therapy Evaluation Patient Details Name: [name] MRN: [id number] DOB: [birth year] Today's Date: 06/10/2016   History of Present Illness  83 y.o. female with history of atrial fibrillation, symptomatic bradycardia status post pacemaker placement, hypertension and hyperthyroidism and was admitted for acute bronchitis  Clinical Impression  Pt admitted with above diagnosis. Pt currently with functional limitations due to the deficits listed below (see PT Problem List). Pt will benefit from skilled PT to increase their independence and safety with mobility to allow discharge to the venue listed below.  Pt reports not feeling well today however agreeable to ambulate as tolerated.  Pt fatigues quickly and SpO2 91-93% room air during ambulation.  Pt reports family is close by her home and granddaughter checks on her frequently.     Follow Up Recommendations Home health PT;Supervision for mobility/OOB (initial supervision)    Equipment Recommendations  None recommended by PT    Recommendations for Other Services       Precautions / Restrictions Precautions Precautions: Fall Restrictions Weight Bearing Restrictions: No      Mobility  Bed Mobility Overal bed mobility: Modified Independent                Transfers Overall transfer level: Needs assistance Equipment used: None Transfers: Sit to/from Stand Sit to Stand: Min guard         General transfer comment: requires UE support to self assist, min/guard for safety  Ambulation/Gait Ambulation/Gait assistance: Min guard Ambulation Distance (Feet): 50 Feet Assistive device: Straight cane Gait Pattern/deviations: Step-through pattern;Decreased stride length;Wide base of support     General Gait Details: fatigues quickly, SpO2 91-93% room air  Stairs            Wheelchair Mobility    Modified Rankin (Stroke Patients Only)       Balance Overall balance assessment: History of Falls;Needs  assistance         Standing balance support: Single extremity supported;During functional activity Standing balance-Leahy Scale: Poor Standing balance comment: requires UE support                             Pertinent Vitals/Pain Pain Assessment: No/denies pain (states only pain with coughing)    Home Living Family/patient expects to be discharged to:: Private residence Living Arrangements: Alone Available Help at Discharge: Family;Available 24 hours/day Type of Home: House Home Access: Stairs to enter   [employer] of Steps: 1 Home Layout: One level Home Equipment: Walker - 2 wheels;Walker - 4 wheels;Shower seat;Bedside commode;Grab bars - tub/shower;Hand held shower head;Cane - single point Additional Comments: reports granddaughter checks in frequently, family live close by    Prior Function Level of Independence: Independent with assistive device(s)         Comments: typically uses SPC     Hand Dominance        Extremity/Trunk Assessment        Lower Extremity Assessment Lower Extremity Assessment: Generalized weakness       Communication   Communication: No difficulties  Cognition Arousal/Alertness: Awake/alert Behavior During Therapy: WFL for tasks assessed/performed Overall Cognitive Status: Within Functional Limits for tasks assessed                      General Comments      Exercises     Assessment/Plan    PT Assessment Patient needs continued PT services  PT Problem List Decreased strength;Decreased mobility;Cardiopulmonary status limiting activity;Decreased  activity tolerance       PT Treatment Interventions Gait training;DME instruction;Therapeutic activities;Therapeutic exercise;Functional mobility training;Patient/family education    PT Goals (Current goals can be found in the Care Plan section)  Acute Rehab PT Goals PT Goal Formulation: With patient Time For Goal Achievement: 06/17/16 Potential to  Achieve Goals: Good    Frequency Min 3X/week   Barriers to discharge        Co-evaluation               End of Session Equipment Utilized During Treatment: Gait belt Activity Tolerance: Patient limited by fatigue Patient left: in chair;with chair alarm set;with call bell/phone within reach   PT Visit Diagnosis: Difficulty in walking, not elsewhere classified (R26.2)         Time: 0017-4944 PT Time Calculation (min) (ACUTE ONLY): 12 min   Charges:   PT Evaluation $PT Eval Low Complexity: 1 Procedure     PT G Codes:         Delanda Bulluck,KATHrine E 06/10/2016, 11:55 AM Carmelia Bake, PT, DPT 06/10/2016 Pager: [phone]

## 2016-06-10 NOTE — Progress Notes (Signed)
ANTICOAGULATION CONSULT NOTE - Follow Up Consult  Pharmacy Consult for Warfarin Indication: atrial fibrillation  Allergies  Allergen Reactions  . Morphine And Related Nausea And Vomiting    Patient Measurements: Height: 5\' 4"  (162.6 cm) Weight: 154 lb 12.8 oz (70.2 kg) IBW/kg (Calculated) : 54.7  Vital Signs: Temp: 98.6 F (37 C) (03/15 0456) Temp Source: Oral (03/15 0456) BP: 127/45 (03/15 0456) Pulse Rate: 62 (03/15 0456)  Labs:  Recent Labs  06/07/16 2220  06/08/16 0625 06/08/16 1242 06/08/16 1712 06/09/16 0553 06/10/16 0547  HGB 11.0*  --  10.9*  --   --   --   --   HCT 32.5*  --  32.7*  --   --   --   --   PLT 258  --  252  --   --   --   --   LABPROT  --   < > 27.9*  --   --  28.6* 35.5*  INR  --   < > 2.55  --   --  2.63 3.44  CREATININE 1.02*  --  1.26*  --   --   --  1.08*  TROPONINI  --   --  <0.03 <0.03 <0.03  --   --   < > = values in this interval not displayed.  Estimated Creatinine Clearance: 34.6 mL/min (A) (by C-G formula based on SCr of 1.08 mg/dL (H)).   Assessment: Patient with afib and chronic warfarin use, admitted with acute bronchitis. Continuing warfarin inpatient.  Today, INR supratherapeutic at 3.44 on home warfarin dosing.  Rose quickly from admission.  Received Levaquin on admission and currently on doxycycline, both which may be cause of increased INR.  No bleeding/complications reported.   Goal of Therapy:  INR 2-3   Plan:  Hold warfarin today. Daily INR. CBC in AM.  Hershal Coria 06/10/2016,9:01 AM

## 2016-06-10 NOTE — Progress Notes (Signed)
Pt instructed on use of Flutter Valve.  Pt demonstrated with good effort and technique, RT to monitor and assess as needed.

## 2016-06-11 ENCOUNTER — Inpatient Hospital Stay (HOSPITAL_COMMUNITY): Payer: Medicare Other

## 2016-06-11 LAB — CBC
HCT: 29 % — ABNORMAL LOW (ref 36.0–46.0)
Hemoglobin: 9.9 g/dL — ABNORMAL LOW (ref 12.0–15.0)
MCH: 29.8 pg (ref 26.0–34.0)
MCHC: 34.1 g/dL (ref 30.0–36.0)
MCV: 87.3 fL (ref 78.0–100.0)
Platelets: 230 10*3/uL (ref 150–400)
RBC: 3.32 MIL/uL — ABNORMAL LOW (ref 3.87–5.11)
RDW: 13.9 % (ref 11.5–15.5)
WBC: 16.4 10*3/uL — ABNORMAL HIGH (ref 4.0–10.5)

## 2016-06-11 LAB — BASIC METABOLIC PANEL
Anion gap: 5 (ref 5–15)
BUN: 26 mg/dL — ABNORMAL HIGH (ref 6–20)
CALCIUM: 8.2 mg/dL — AB (ref 8.9–10.3)
CHLORIDE: 99 mmol/L — AB (ref 101–111)
CO2: 24 mmol/L (ref 22–32)
CREATININE: 0.93 mg/dL (ref 0.44–1.00)
GFR calc non Af Amer: 53 mL/min — ABNORMAL LOW (ref >60)
Glucose, Bld: 105 mg/dL — ABNORMAL HIGH (ref 65–99)
Potassium: 4.5 mmol/L (ref 3.5–5.1)
SODIUM: 128 mmol/L — AB (ref 135–145)

## 2016-06-11 LAB — PROTIME-INR
INR: 3.59
Prothrombin Time: 36.7 seconds — ABNORMAL HIGH (ref 11.4–15.2)

## 2016-06-11 LAB — SODIUM, URINE, RANDOM: Sodium, Ur: 75 mmol/L

## 2016-06-11 MED ORDER — CLONIDINE HCL 0.1 MG PO TABS
0.2000 mg | ORAL_TABLET | Freq: Two times a day (BID) | ORAL | Status: DC
  Administered 2016-06-11 – 2016-06-14 (×7): 0.2 mg via ORAL
  Filled 2016-06-11 (×7): qty 2

## 2016-06-11 MED ORDER — DEXTROSE 5 % IV SOLN
500.0000 mg | INTRAVENOUS | Status: DC
  Administered 2016-06-11 – 2016-06-12 (×2): 500 mg via INTRAVENOUS
  Filled 2016-06-11 (×2): qty 500

## 2016-06-11 MED ORDER — DEXTROSE 5 % IV SOLN
1.0000 g | INTRAVENOUS | Status: DC
  Administered 2016-06-11 – 2016-06-15 (×5): 1 g via INTRAVENOUS
  Filled 2016-06-11 (×5): qty 10

## 2016-06-11 MED ORDER — IRBESARTAN 150 MG PO TABS
300.0000 mg | ORAL_TABLET | Freq: Every morning | ORAL | Status: DC
  Administered 2016-06-11 – 2016-06-15 (×5): 300 mg via ORAL
  Filled 2016-06-11 (×5): qty 2

## 2016-06-11 MED ORDER — FAMOTIDINE 20 MG PO TABS
40.0000 mg | ORAL_TABLET | Freq: Two times a day (BID) | ORAL | Status: AC
  Administered 2016-06-11 – 2016-06-13 (×6): 40 mg via ORAL
  Filled 2016-06-11 (×6): qty 2

## 2016-06-11 MED ORDER — FUROSEMIDE 10 MG/ML IJ SOLN
40.0000 mg | Freq: Once | INTRAMUSCULAR | Status: AC
  Administered 2016-06-11: 40 mg via INTRAVENOUS
  Filled 2016-06-11: qty 4

## 2016-06-11 MED ORDER — PREDNISONE 10 MG PO TABS
30.0000 mg | ORAL_TABLET | Freq: Every day | ORAL | Status: DC
  Administered 2016-06-11 – 2016-06-15 (×5): 30 mg via ORAL
  Filled 2016-06-11 (×6): qty 1

## 2016-06-11 NOTE — Progress Notes (Addendum)
ANTICOAGULATION CONSULT NOTE - Follow Up Consult  Pharmacy Consult for Warfarin Indication: atrial fibrillation  Allergies  Allergen Reactions  . Morphine And Related Nausea And Vomiting    Patient Measurements: Height: 5\' 4"  (162.6 cm) Weight: 154 lb 12.8 oz (70.2 kg) IBW/kg (Calculated) : 54.7  Vital Signs: Temp: 97.3 F (36.3 C) (03/16 0601) Temp Source: Oral (03/16 0601) BP: 167/67 (03/16 0601) Pulse Rate: 60 (03/16 0601)  Labs:  Recent Labs  06/08/16 1242 06/08/16 1712 06/09/16 0553 06/10/16 0547 06/10/16 1430 06/11/16 0546  HGB  --   --   --   --   --  9.9*  HCT  --   --   --   --   --  29.0*  PLT  --   --   --   --   --  230  LABPROT  --   --  28.6* 35.5*  --  36.7*  INR  --   --  2.63 3.44  --  3.59  CREATININE  --   --   --  1.08* 1.12* 0.93  TROPONINI <0.03 <0.03  --   --   --   --     Estimated Creatinine Clearance: 40.2 mL/min (by C-G formula based on SCr of 0.93 mg/dL).   Assessment: Patient with afib and chronic warfarin use, admitted with acute bronchitis. Continuing warfarin inpatient.  Today, INR supratherapeutic at 3.59 on home warfarin dosing.  INR was subtherapeutic at 1.89 on admission but rose quickly from admission.  Received Levaquin on admission and currently on doxycycline, both likely cause of increased INR.  Hgb decreased to 9.9, platelets WNL.  No bleeding/complications reported.  MD reports patient still wheezing, not improving on doxy so will plan on switching to Ceftriaxone/Azithromycin.  Goal of Therapy:  INR 2-3   Plan:  Hold warfarin today. Daily INR.  Hershal Coria 06/11/2016,8:27 AM

## 2016-06-11 NOTE — Progress Notes (Signed)
PROGRESS NOTE                                                                                                                                                                                                             Patient Demographics:    [name], is a 83 y.o. female, DOB - 01-06-1926, KZL:[id number]  Admit date - 06/07/2016   Admitting Physician Rise Patience, MD  Outpatient Primary MD for the patient is [name], MD  LOS - 3   Chief Complaint  Patient presents with  . Shortness of Breath       Brief Narrative   83 y.o. female with history of atrial fibrillation, symptomatic bradycardia status post pacemaker placement, hypertension and hyperthyroidism presents to the ER with complaints of shortness of breath and wheezing, she was admitted for acute bronchitis  Subjective:    [name] today has, No headache, No chest pain, No abdominal pain - No Nausea,Still reports cough with productive sputum, she is short of breath with wheezing this a.m.    Assessment  & Plan :    Principal Problem:   Acute bronchitis Active Problems:   Essential hypertension   ATRIAL FIBRILLATION   Cardiac pacemaker in situ   Fall   Acute bronchitis - no h/o COPD or asthma, Presents with cough, significant wheezing, no evidence of active pulmonary disease and chest x-ray, this is most likely acute bronchitis with bronchospasm,  - We'll start on Mucinex and flutter valve - cont Pulmicort,  Xopenex, Atrovent. - No significant wheezing this a.m., ports more cough and productive sputum, so we'll change doxycycline to Rocephin and azithromycin P2 2 view chest x-ray with evidence of marked pleural effusion, so she was given 40 mg of IV Lasix with improvement of her dyspnea.  Essential hypertension -  uncontrolled today, so increased clonidine, continue with Norvasc, resumed of Avapro, stopped hydralazine , as well given her hyponatremia I  have stopped Maxzide .   ATRIAL FIBRILLATION -  Cardiac pacemaker in situ for bradycardia - coumadin per pharmacy - V paced  Hypokalemia/hypomagnesemia - Repleted,  Hyperkalemia  - Resolved, continue to hold supplement, resume of Avapro   Hyponatremia -With some volume overload today, received Lasix, continue with the fluid restrictions  Hyperthyroid - Tapazol  History of symptomatic bradycardia status post pacemaker placement.  Code Status : Full  Family Communication  : None at bedside  Disposition Plan  : pending PT evaluation  Consults  :  None  Procedures  : None  DVT Prophylaxis  :  On warfarin  Lab Results  Component Value Date   PLT 230 06/11/2016    Antibiotics  :    Anti-infectives    Start     Dose/Rate Route Frequency Ordered Stop   06/11/16 1000  cefTRIAXone (ROCEPHIN) 1 g in dextrose 5 % 50 mL IVPB     1 g 100 mL/hr over 30 Minutes Intravenous Every 24 hours 06/11/16 0844     06/11/16 1000  azithromycin (ZITHROMAX) 500 mg in dextrose 5 % 250 mL IVPB     500 mg 250 mL/hr over 60 Minutes Intravenous Every 24 hours 06/11/16 0844     06/09/16 2200  doxycycline (VIBRA-TABS) tablet 100 mg  Status:  Discontinued     100 mg Oral Every 12 hours 06/09/16 1100 06/11/16 0844   06/08/16 0345  doxycycline (VIBRAMYCIN) 100 mg in dextrose 5 % 250 mL IVPB  Status:  Discontinued     100 mg 125 mL/hr over 120 Minutes Intravenous 2 times daily 06/08/16 0327 06/09/16 1100   06/08/16 0115  levofloxacin (LEVAQUIN) IVPB 500 mg     500 mg 100 mL/hr over 60 Minutes Intravenous  Once 06/08/16 0105 06/08/16 0231        Objective:   Vitals:   06/11/16 0601 06/11/16 0838 06/11/16 1150 06/11/16 1509  BP: (!) 167/67  (!) 150/70 (!) 118/52  Pulse: 60  67 62  Resp: 17   18  Temp: 97.3 F (36.3 C)  97.6 F (36.4 C) 97.6 F (36.4 C)  TempSrc: Oral  Oral Oral  SpO2: 96% 97% 97% 98%  Weight:      Height:        Wt Readings from Last 3 Encounters:  06/08/16  70.2 kg (154 lb 12.8 oz)  09/24/15 69.4 kg (153 lb)  05/29/15 70.3 kg (155 lb)     Intake/Output Summary (Last 24 hours) at 06/11/16 1700 Last data filed at 06/11/16 1542  Gross per 24 hour  Intake          2338.75 ml  Output             3500 ml  Net         -1161.25 ml     Physical Exam  Awake Alert, Oriented X 3, No new F.N deficits, Normal affect Supple Neck,No JVD,  Symmetrical Chest wall movement,fair air movement bilaterally, some mild wheezing, RRR,No Gallops,Rubs or new Murmurs, No Parasternal Heave +ve B.Sounds, Abd Soft, No tenderness,  No rebound - guarding or rigidity. No Cyanosis, Clubbing , No new Rash or bruise , Trace pedal edema    Data Review:    CBC  Recent Labs Lab 06/07/16 2220 06/08/16 0625 06/11/16 0546  WBC 11.6* 12.1* 16.4*  HGB 11.0* 10.9* 9.9*  HCT 32.5* 32.7* 29.0*  PLT 258 252 230  MCV 88.6 89.3 87.3  MCH 30.0 29.8 29.8  MCHC 33.8 33.3 34.1  RDW 13.4 13.6 13.9  LYMPHSABS 1.2 0.3*  --   MONOABS 0.8 0.1  --   EOSABS 0.1 0.0  --   BASOSABS 0.0 0.0  --     Chemistries   Recent Labs Lab 06/07/16 2220 06/08/16 0625 06/10/16 0547 06/10/16 1430 06/11/16 0546  NA 134* 133* 127* 125* 128*  K 3.6 3.0* 5.7* 4.6 4.5  CL 100* 98* 96* 95* 99*  CO2 29 21* 23 22 24   GLUCOSE 110* 267* 131* 153* 105*  BUN 20 23* 32* 29* 26*  CREATININE 1.02* 1.26* 1.08* 1.12* 0.93  CALCIUM 9.1 9.1 8.6* 8.6* 8.2*  MG  --  1.6*  --   --   --   AST 21 31  --   --   --   ALT 14 14  --   --   --   ALKPHOS 86 77  --   --   --   BILITOT 0.5 0.7  --   --   --    ------------------------------------------------------------------------------------------------------------------ No results for input(s): CHOL, HDL, LDLCALC, TRIG, CHOLHDL, LDLDIRECT in the last 72 hours.  No results found for: HGBA1C ------------------------------------------------------------------------------------------------------------------ No results for input(s): TSH, T4TOTAL, T3FREE,  THYROIDAB in the last 72 hours.  Invalid input(s): FREET3 ------------------------------------------------------------------------------------------------------------------ No results for input(s): VITAMINB12, FOLATE, FERRITIN, TIBC, IRON, RETICCTPCT in the last 72 hours.  Coagulation profile  Recent Labs Lab 06/07/16 2239 06/08/16 0625 06/09/16 0553 06/10/16 0547 06/11/16 0546  INR 1.86 2.55 2.63 3.44 3.59    No results for input(s): DDIMER in the last 72 hours.  Cardiac Enzymes  Recent Labs Lab 06/08/16 0625 06/08/16 1242 06/08/16 1712  TROPONINI <0.03 <0.03 <0.03   ------------------------------------------------------------------------------------------------------------------    Component Value Date/Time   BNP 203.9 (H) 06/07/2016 2220    Inpatient Medications  Scheduled Meds: . amLODipine  10 mg Oral q morning - 10a  . azithromycin  500 mg Intravenous Q24H  . budesonide (PULMICORT) nebulizer solution  0.25 mg Nebulization BID  . cefTRIAXone (ROCEPHIN)  IV  1 g Intravenous Q24H  . cloNIDine  0.2 mg Oral BID  . famotidine  40 mg Oral BID  . guaiFENesin  1,200 mg Oral BID  . ipratropium  0.5 mg Nebulization TID  . irbesartan  300 mg Oral q morning - 10a  . levalbuterol  0.63 mg Nebulization TID  . methimazole  5 mg Oral BID  . pantoprazole  40 mg Oral Daily  . pravastatin  40 mg Oral q morning - 10a  . predniSONE  30 mg Oral Q breakfast  . Warfarin - Pharmacist Dosing Inpatient   Does not apply q1800   Continuous Infusions: PRN Meds:.acetaminophen **OR** acetaminophen, alum & mag hydroxide-simeth, calcium carbonate, clonazePAM, hydrALAZINE, levalbuterol, ondansetron **OR** ondansetron (ZOFRAN) IV  Micro Results No results found for this or any previous visit (from the past 240 hour(s)).  Radiology Reports Dg Chest 2 View  Result Date: 06/11/2016 CLINICAL DATA:  Shortness of breath. EXAM: CHEST  2 VIEW COMPARISON:  06/07/2016 . FINDINGS: Cardiac pacer  with lead tip over the right ventricle. Cardiomegaly with normal pulmonary vascularity. Low lung volumes with mild basilar atelectasis. Small left pleural effusion. No pneumothorax. IMPRESSION: 1. Low lung volumes with mild basilar atelectasis. Small left pleural effusion. 2. Cardiac pacer noted with lead tip over the right ventricle. Electronically Signed   By: Marcello Moores  Register   On: 06/11/2016 10:43   Dg Chest 2 View  Result Date: 06/07/2016 CLINICAL DATA:  Wheezing, SOB, congestion. Worse today. Pacemaker placed 2007. Non smoker EXAM: CHEST  2 VIEW COMPARISON:  05/13/2015 FINDINGS: Cardiac pacemaker. Linear fibrosis in the left lung base is unchanged since prior study. Slight fibrosis also demonstrated in the right lung base, unchanged. Peribronchial thickening centrally suggesting chronic bronchitis. No focal airspace disease or consolidation. Normal heart size and pulmonary vascularity. Calcified and tortuous aorta. Degenerative changes in the spine.  IMPRESSION: Chronic bronchitic changes in chronic scarring in the lung bases. No evidence of active pulmonary disease. Electronically Signed   By: Lucienne Capers M.D.   On: 06/07/2016 22:02     [facility], [place] Cinquemani M.D on 06/11/2016 at 5:00 PM  Between 7am to 7pm - Pager - [phone]  After 7pm go to [email or web address] - password [facility]  [facility] -  Office  [phone]

## 2016-06-11 NOTE — Progress Notes (Signed)
  Physical Therapy Treatment Patient Details Name: GILDA ABBOUD MRN: [id number] DOB: 1926/03/10 Today's Date: 06/11/2016    History of Present Illness 83 y.o. female with history of atrial fibrillation, symptomatic bradycardia status post pacemaker placement, hypertension and hyperthyroidism and was admitted for acute bronchitis    PT Comments    Pt assisted to bathroom to brush hair/brush teeth and wash face standing at sink prior to ambulating.  Pt then tolerated improved distance ambulating in hallway.  Follow Up Recommendations  Home health PT;Supervision for mobility/OOB     Equipment Recommendations  None recommended by PT    Recommendations for Other Services       Precautions / Restrictions Precautions Precautions: Fall Restrictions Weight Bearing Restrictions: No    Mobility  Bed Mobility Overal bed mobility: Modified Independent                Transfers Overall transfer level: Needs assistance Equipment used: None Transfers: Sit to/from Stand Sit to Stand: Min guard         General transfer comment: requires UE support to self assist, min/guard for safety  Ambulation/Gait Ambulation/Gait assistance: Min guard Ambulation Distance (Feet): 120 Feet Assistive device: Straight cane Gait Pattern/deviations: Step-through pattern;Decreased stride length;Wide base of support     General Gait Details: able to tolerate imrpoved distance today, SpO2 96% room air, also used hand rail in hallway for more stability   Stairs            Wheelchair Mobility    Modified Rankin (Stroke Patients Only)       Balance                                    Cognition Arousal/Alertness: Awake/alert Behavior During Therapy: WFL for tasks assessed/performed Overall Cognitive Status: Within Functional Limits for tasks assessed                      Exercises      General Comments        Pertinent Vitals/Pain Pain  Assessment: No/denies pain    Home Living                      Prior Function            PT Goals (current goals can now be found in the care plan section) Progress towards PT goals: Progressing toward goals    Frequency    Min 3X/week      PT Plan Current plan remains appropriate    Co-evaluation             End of Session   Activity Tolerance: Patient tolerated treatment well Patient left: in chair;with chair alarm set;with call bell/phone within reach   PT Visit Diagnosis: Difficulty in walking, not elsewhere classified (R26.2)     Time: 1047-1101 PT Time Calculation (min) (ACUTE ONLY): 14 min  Charges:  $Gait Training: 8-22 mins                    G Codes:       Alida Greiner,KATHrine E 06/11/2016, 12:29 PM Carmelia Bake, PT, DPT 06/11/2016 Pager: [phone]

## 2016-06-12 ENCOUNTER — Inpatient Hospital Stay (HOSPITAL_COMMUNITY): Payer: Medicare Other

## 2016-06-12 LAB — BASIC METABOLIC PANEL
ANION GAP: 6 (ref 5–15)
BUN: 23 mg/dL — ABNORMAL HIGH (ref 6–20)
CHLORIDE: 95 mmol/L — AB (ref 101–111)
CO2: 27 mmol/L (ref 22–32)
CREATININE: 0.89 mg/dL (ref 0.44–1.00)
Calcium: 8.2 mg/dL — ABNORMAL LOW (ref 8.9–10.3)
GFR calc non Af Amer: 56 mL/min — ABNORMAL LOW (ref >60)
Glucose, Bld: 91 mg/dL (ref 65–99)
POTASSIUM: 3.5 mmol/L (ref 3.5–5.1)
Sodium: 128 mmol/L — ABNORMAL LOW (ref 135–145)

## 2016-06-12 LAB — PROTIME-INR
INR: 3.86
PROTHROMBIN TIME: 38.9 s — AB (ref 11.4–15.2)

## 2016-06-12 MED ORDER — AZITHROMYCIN 250 MG PO TABS
250.0000 mg | ORAL_TABLET | Freq: Every day | ORAL | Status: DC
  Administered 2016-06-13 – 2016-06-15 (×3): 250 mg via ORAL
  Filled 2016-06-12 (×3): qty 1

## 2016-06-12 MED ORDER — SUCRALFATE 1 GM/10ML PO SUSP
1.0000 g | Freq: Three times a day (TID) | ORAL | Status: DC
  Administered 2016-06-12 – 2016-06-15 (×13): 1 g via ORAL
  Filled 2016-06-12 (×13): qty 10

## 2016-06-12 MED ORDER — POTASSIUM CHLORIDE CRYS ER 20 MEQ PO TBCR
20.0000 meq | EXTENDED_RELEASE_TABLET | Freq: Every day | ORAL | Status: DC
  Administered 2016-06-12 – 2016-06-15 (×4): 20 meq via ORAL
  Filled 2016-06-12 (×4): qty 1

## 2016-06-12 NOTE — Progress Notes (Signed)
PROGRESS NOTE                                                                                                                                                                                                             Patient Demographics:    [name], is a 83 y.o. female, DOB - 1926/02/11, [id number]  Admit date - 06/07/2016   Admitting Physician Rise Patience, MD  Outpatient Primary MD for the patient is [name], MD  LOS - 4   Chief Complaint  Patient presents with  . Shortness of Breath       Brief Narrative   83 y.o. female with history of atrial fibrillation, symptomatic bradycardia status post pacemaker placement, hypertension and hyperthyroidism presents to the ER with complaints of shortness of breath and wheezing, she was admitted for acute bronchitis, Receiving significant wheezing she started on steroids as well.  Subjective:    Heath Badon today has, No headache, No chest pain, No abdominal pain - No Nausea,Still reports cough with productive sputum, she is short of breath with wheezing this a.m.    Assessment  & Plan :    Principal Problem:   Acute bronchitis Active Problems:   Essential hypertension   ATRIAL FIBRILLATION   Cardiac pacemaker in situ   Fall   Acute bronchitis - no h/o COPD or asthma, Presents with cough, significant wheezing, no evidence of active pulmonary disease and chest x-ray, this is most likely acute bronchitis with bronchospasm,  - cont Pulmicort,  Xopenex, Atrovent. - Doxycycline changed to Rocephin and azithromycin given significant cough or wheezing and productive sputum , - patient reports her cough and wheezing is more pronounced overnight and in morning , I do suspect significant reflux contributing to her symptoms , already on Pepcid, Protonix, will add Cipro fate today . - CT chest was obtained today given persistent wheezing , evidence of small  infectious/inflammatory process and upper lungs, and significantly enlarged right gland extended into superior mediastinum (presents in the venous imaging as well ) unclear if this is contributing to her cough and wheezing  Essential hypertension -  Better controlled after increasing clonidine, continue with Norvasc,Avapro, given her hyponatremia I have stopped Maxzide .   ATRIAL FIBRILLATION -  Cardiac pacemaker in situ for bradycardia - coumadin per pharmacy - V paced  Hypokalemia/hypomagnesemia -  Repleted,  Hyperkalemia  - Resolved,   Hyponatremia -With some volume overload ,received Lasix 3/16,  continue with the fluid restrictions  Hyperthyroid - Continue with Tapazole  History of symptomatic bradycardia status post pacemaker placement.    Code Status : Full  Family Communication  : None at bedside  Disposition Plan  : home when stable  Consults  :  None  Procedures  : None  DVT Prophylaxis  :  On warfarin  Lab Results  Component Value Date   PLT 230 06/11/2016    Antibiotics  :    Anti-infectives    Start     Dose/Rate Route Frequency Ordered Stop   06/11/16 1000  cefTRIAXone (ROCEPHIN) 1 g in dextrose 5 % 50 mL IVPB     1 g 100 mL/hr over 30 Minutes Intravenous Every 24 hours 06/11/16 0844     06/11/16 1000  azithromycin (ZITHROMAX) 500 mg in dextrose 5 % 250 mL IVPB     500 mg 250 mL/hr over 60 Minutes Intravenous Every 24 hours 06/11/16 0844     06/09/16 2200  doxycycline (VIBRA-TABS) tablet 100 mg  Status:  Discontinued     100 mg Oral Every 12 hours 06/09/16 1100 06/11/16 0844   06/08/16 0345  doxycycline (VIBRAMYCIN) 100 mg in dextrose 5 % 250 mL IVPB  Status:  Discontinued     100 mg 125 mL/hr over 120 Minutes Intravenous 2 times daily 06/08/16 0327 06/09/16 1100   06/08/16 0115  levofloxacin (LEVAQUIN) IVPB 500 mg     500 mg 100 mL/hr over 60 Minutes Intravenous  Once 06/08/16 0105 06/08/16 0231        Objective:   Vitals:   06/11/16  2118 06/11/16 2217 06/12/16 0642 06/12/16 0737  BP:  107/60 (!) 145/83   Pulse:  66 65   Resp:  18 18   Temp:  97.5 F (36.4 C) 97.9 F (36.6 C)   TempSrc:  Oral Oral   SpO2: 94% 96% 95% 96%  Weight:      Height:        Wt Readings from Last 3 Encounters:  06/08/16 70.2 kg (154 lb 12.8 oz)  09/24/15 69.4 kg (153 lb)  05/29/15 70.3 kg (155 lb)     Intake/Output Summary (Last 24 hours) at 06/12/16 1151 Last data filed at 06/12/16 0900  Gross per 24 hour  Intake              730 ml  Output             3650 ml  Net            -2920 ml     Physical Exam  Awake Alert, Oriented X 3, No new F.N deficits, Normal affect Symmetrical Chest wall movement,fair air movement bilaterally, some mild wheezing, RRR,No Gallops,Rubs or new Murmurs, No Parasternal Heave +ve B.Sounds, Abd Soft, No tenderness,  No rebound - guarding or rigidity. No Cyanosis, Clubbing , No new Rash or bruise , no edema    Data Review:    CBC  Recent Labs Lab 06/07/16 2220 06/08/16 0625 06/11/16 0546  WBC 11.6* 12.1* 16.4*  HGB 11.0* 10.9* 9.9*  HCT 32.5* 32.7* 29.0*  PLT 258 252 230  MCV 88.6 89.3 87.3  MCH 30.0 29.8 29.8  MCHC 33.8 33.3 34.1  RDW 13.4 13.6 13.9  LYMPHSABS 1.2 0.3*  --   MONOABS 0.8 0.1  --   EOSABS 0.1 0.0  --   BASOSABS 0.0  0.0  --     Chemistries   Recent Labs Lab 06/07/16 2220 06/08/16 0625 06/10/16 0547 06/10/16 1430 06/11/16 0546 06/12/16 0627  NA 134* 133* 127* 125* 128* 128*  K 3.6 3.0* 5.7* 4.6 4.5 3.5  CL 100* 98* 96* 95* 99* 95*  CO2 29 21* 23 22 24 27   GLUCOSE 110* 267* 131* 153* 105* 91  BUN 20 23* 32* 29* 26* 23*  CREATININE 1.02* 1.26* 1.08* 1.12* 0.93 0.89  CALCIUM 9.1 9.1 8.6* 8.6* 8.2* 8.2*  MG  --  1.6*  --   --   --   --   AST 21 31  --   --   --   --   ALT 14 14  --   --   --   --   ALKPHOS 86 77  --   --   --   --   BILITOT 0.5 0.7  --   --   --   --     ------------------------------------------------------------------------------------------------------------------ No results for input(s): CHOL, HDL, LDLCALC, TRIG, CHOLHDL, LDLDIRECT in the last 72 hours.  No results found for: HGBA1C ------------------------------------------------------------------------------------------------------------------ No results for input(s): TSH, T4TOTAL, T3FREE, THYROIDAB in the last 72 hours.  Invalid input(s): FREET3 ------------------------------------------------------------------------------------------------------------------ No results for input(s): VITAMINB12, FOLATE, FERRITIN, TIBC, IRON, RETICCTPCT in the last 72 hours.  Coagulation profile  Recent Labs Lab 06/08/16 0625 06/09/16 0553 06/10/16 0547 06/11/16 0546 06/12/16 0627  INR 2.55 2.63 3.44 3.59 3.86    No results for input(s): DDIMER in the last 72 hours.  Cardiac Enzymes  Recent Labs Lab 06/08/16 0625 06/08/16 1242 06/08/16 1712  TROPONINI <0.03 <0.03 <0.03   ------------------------------------------------------------------------------------------------------------------    Component Value Date/Time   BNP 203.9 (H) 06/07/2016 2220    Inpatient Medications  Scheduled Meds: . amLODipine  10 mg Oral q morning - 10a  . azithromycin  500 mg Intravenous Q24H  . budesonide (PULMICORT) nebulizer solution  0.25 mg Nebulization BID  . cefTRIAXone (ROCEPHIN)  IV  1 g Intravenous Q24H  . cloNIDine  0.2 mg Oral BID  . famotidine  40 mg Oral BID  . guaiFENesin  1,200 mg Oral BID  . ipratropium  0.5 mg Nebulization TID  . irbesartan  300 mg Oral q morning - 10a  . levalbuterol  0.63 mg Nebulization TID  . methimazole  5 mg Oral BID  . pantoprazole  40 mg Oral Daily  . potassium chloride  20 mEq Oral Daily  . pravastatin  40 mg Oral q morning - 10a  . predniSONE  30 mg Oral Q breakfast  . sucralfate  1 g Oral TID WC & HS  . Warfarin - Pharmacist Dosing Inpatient   Does  not apply q1800   Continuous Infusions: PRN Meds:.acetaminophen **OR** acetaminophen, alum & mag hydroxide-simeth, calcium carbonate, clonazePAM, hydrALAZINE, levalbuterol, ondansetron **OR** ondansetron (ZOFRAN) IV  Micro Results No results found for this or any previous visit (from the past 240 hour(s)).  Radiology Reports Dg Chest 2 View  Result Date: 06/11/2016 CLINICAL DATA:  Shortness of breath. EXAM: CHEST  2 VIEW COMPARISON:  06/07/2016 . FINDINGS: Cardiac pacer with lead tip over the right ventricle. Cardiomegaly with normal pulmonary vascularity. Low lung volumes with mild basilar atelectasis. Small left pleural effusion. No pneumothorax. IMPRESSION: 1. Low lung volumes with mild basilar atelectasis. Small left pleural effusion. 2. Cardiac pacer noted with lead tip over the right ventricle. Electronically Signed   By: Marcello Moores  Register   On: 06/11/2016 10:43  Dg Chest 2 View  Result Date: 06/07/2016 CLINICAL DATA:  Wheezing, SOB, congestion. Worse today. Pacemaker placed 2007. Non smoker EXAM: CHEST  2 VIEW COMPARISON:  05/13/2015 FINDINGS: Cardiac pacemaker. Linear fibrosis in the left lung base is unchanged since prior study. Slight fibrosis also demonstrated in the right lung base, unchanged. Peribronchial thickening centrally suggesting chronic bronchitis. No focal airspace disease or consolidation. Normal heart size and pulmonary vascularity. Calcified and tortuous aorta. Degenerative changes in the spine. IMPRESSION: Chronic bronchitic changes in chronic scarring in the lung bases. No evidence of active pulmonary disease. Electronically Signed   By: Lucienne Capers M.D.   On: 06/07/2016 22:02   Ct Chest Wo Contrast  Result Date: 06/12/2016 CLINICAL DATA:  83 year old female with shortness of breath, chest pain and productive cough EXAM: CT CHEST WITHOUT CONTRAST TECHNIQUE: Multidetector CT imaging of the chest was performed following the standard protocol without IV contrast.  COMPARISON:  Chest x-ray 06/11/2016 FINDINGS: Cardiovascular: Limited evaluation in the absence of intravenous contrast. Conventional 3 vessel arch anatomy. Scattered atherosclerotic calcifications. No aneurysm. Calcifications are also present throughout the coronary arteries. The heart is enlarged, particularly both the left and right atria. Intracardiac defibrillator present with the tip in the right ventricular apex. No pericardial effusion. Mediastinum/Nodes: Heterogeneous Lee enlarged and globular thyroid gland most consistent with thyroid goiter. A large goitrous nodule extends into the upper mediastinum. No additional mediastinal mass or suspicious adenopathy. Small hiatal hernia. Lungs/Pleura: Trace subsegmental atelectasis in the left lower lobe with elevation of the left hemidiaphragm. There are a few small isolated patches of ground-glass attenuation airspace opacity in both upper lungs (right upper lobe on 33 and 36 of series 5 and left upper lobe on image 64 of series 5). Tiny calcified subpleural granuloma in the periphery of the right lower lobe. No follow-up imaging required. No pleural effusion or pulmonary edema. Upper Abdomen: The gallbladder is surgically absent. No intra or extrahepatic biliary ductal dilatation. A circumscribed 1.9 cm low-attenuation lesion in the left hepatic lobe is incompletely characterized in the absence of intravenous contrast but statistically highly likely a benign cyst. No acute abnormalities identified. Musculoskeletal: No acute fracture or aggressive appearing lytic or blastic osseous lesion. IMPRESSION: 1. There are just a few small patchy foci of ground-glass attenuation airspace opacity in the right and left upper lobes. These findings are nonspecific but most suggestive of an acute infectious or inflammatory process such as early multifocal pneumonia or an atypical/viral respiratory infection. It should be emphasized that the imaging findings are subtle and  involve an extreme minority of the overall lung parenchyma. 2. Coronary artery calcifications. 3.  Aortic Atherosclerosis (ICD10-170.0). 4. Large thyroid goiter with extension into the superior mediastinum. 5. Well-positioned intracardiac defibrillator lead. Electronically Signed   By: Jacqulynn Cadet M.D.   On: 06/12/2016 11:10     Donnis Pecha M.D on 06/12/2016 at 11:51 AM  Between 7am to 7pm - Pager - [phone]  After 7pm go to [email or web address] - password [email or web address]  [facility] -  Office  [phone]

## 2016-06-12 NOTE — Progress Notes (Signed)
ANTICOAGULATION CONSULT NOTE - Follow Up Consult  Pharmacy Consult for Warfarin Indication: atrial fibrillation  Allergies  Allergen Reactions  . Morphine And Related Nausea And Vomiting    Patient Measurements: Height: 5\' 4"  (162.6 cm) Weight: 154 lb 12.8 oz (70.2 kg) IBW/kg (Calculated) : 54.7  Vital Signs: Temp: 97.9 F (36.6 C) (03/17 0642) Temp Source: Oral (03/17 0642) BP: 145/83 (03/17 0642) Pulse Rate: 65 (03/17 0642)  Labs:  Recent Labs  06/10/16 0547 06/10/16 1430 06/11/16 0546 06/12/16 0627  HGB  --   --  9.9*  --   HCT  --   --  29.0*  --   PLT  --   --  230  --   LABPROT 35.5*  --  36.7* 38.9*  INR 3.44  --  3.59 3.86  CREATININE 1.08* 1.12* 0.93 0.89    Estimated Creatinine Clearance: 42 mL/min (by C-G formula based on SCr of 0.89 mg/dL).   Assessment: Patient with afib and chronic warfarin use, admitted with acute bronchitis. Continuing warfarin inpatient.  INR was subtherapeutic at 1.89 on admission but rose quickly from admission.  Received Levaquin on admission and currently on doxycycline, both likely cause of increased INR.  Hgb decreased to 9.9, platelets WNL.  No bleeding/complications reported.  06/12/2016 INR supratherapeutic at 3.86.  DI: azithromycin can increase INR No bleeding reported  Goal of Therapy:  INR 2-3   Plan:  Hold warfarin today. Daily INR.  Dolly Rias RPh 06/12/2016, 8:20 AM Pager [phone]

## 2016-06-13 LAB — BASIC METABOLIC PANEL
Anion gap: 6 (ref 5–15)
BUN: 20 mg/dL (ref 6–20)
CHLORIDE: 95 mmol/L — AB (ref 101–111)
CO2: 28 mmol/L (ref 22–32)
Calcium: 8.5 mg/dL — ABNORMAL LOW (ref 8.9–10.3)
Creatinine, Ser: 0.93 mg/dL (ref 0.44–1.00)
GFR calc Af Amer: >60 mL/min (ref >60)
GFR calc non Af Amer: 53 mL/min — ABNORMAL LOW (ref >60)
Glucose, Bld: 93 mg/dL (ref 65–99)
POTASSIUM: 3.7 mmol/L (ref 3.5–5.1)
Sodium: 129 mmol/L — ABNORMAL LOW (ref 135–145)

## 2016-06-13 LAB — CBC
HEMATOCRIT: 30.6 % — AB (ref 36.0–46.0)
Hemoglobin: 10.7 g/dL — ABNORMAL LOW (ref 12.0–15.0)
MCH: 30.7 pg (ref 26.0–34.0)
MCHC: 35 g/dL (ref 30.0–36.0)
MCV: 87.7 fL (ref 78.0–100.0)
Platelets: 264 10*3/uL (ref 150–400)
RBC: 3.49 MIL/uL — ABNORMAL LOW (ref 3.87–5.11)
RDW: 13.6 % (ref 11.5–15.5)
WBC: 11.3 10*3/uL — ABNORMAL HIGH (ref 4.0–10.5)

## 2016-06-13 LAB — PROTIME-INR
INR: 3.18
Prothrombin Time: 33.3 seconds — ABNORMAL HIGH (ref 11.4–15.2)

## 2016-06-13 MED ORDER — GUAIFENESIN-DM 100-10 MG/5ML PO SYRP
5.0000 mL | ORAL_SOLUTION | ORAL | Status: DC | PRN
  Administered 2016-06-14 (×2): 5 mL via ORAL
  Filled 2016-06-13 (×2): qty 10

## 2016-06-13 MED ORDER — METHOCARBAMOL 500 MG PO TABS
500.0000 mg | ORAL_TABLET | Freq: Four times a day (QID) | ORAL | Status: DC | PRN
  Administered 2016-06-13 – 2016-06-14 (×4): 500 mg via ORAL
  Filled 2016-06-13 (×4): qty 1

## 2016-06-13 NOTE — Progress Notes (Signed)
PROGRESS NOTE                                                                                                                                                                                                             Patient Demographics:    [name], is a 83 y.o. female, DOB - [birth year], TWS:[id number]  Admit date - 06/07/2016   Admitting Physician Rise Patience, MD  Outpatient Primary MD for the patient is [name], MD  LOS - 5   Chief Complaint  Patient presents with  . Shortness of Breath       Brief Narrative   83 y.o. female with history of atrial fibrillation, symptomatic bradycardia status post pacemaker placement, hypertension and hyperthyroidism presents to the ER with complaints of shortness of breath and wheezing, she was admitted for acute bronchitis, Receiving significant wheezing she started on steroids as well.  Subjective:    [name] today has, No headache, No chest pain, No abdominal pain - No Nausea,Still reports cough with productive sputum, she is short of breath with wheezing this a.m.    Assessment  & Plan :    Principal Problem:   Acute bronchitis Active Problems:   Essential hypertension   ATRIAL FIBRILLATION   Cardiac pacemaker in situ   Fall   Acute bronchitis With wheezing and bronchospasm - no h/o COPD or asthma, Presents with cough, significant wheezing, no evidence of active pulmonary disease and chest x-ray, this is most likely acute bronchitis with bronchospasm,  - cont Pulmicort,  Xopenex, Atrovent. - Doxycycline changed to Rocephin and azithromycin given significant cough or wheezing and productive sputum , - patient reports her cough and wheezing is more pronounced overnight and in morning , I do suspect significant reflux contributing to her symptoms , already on Pepcid, Protonix, will add Cipro fate today . - CT chest was obtained today given persistent wheezing , evidence  of small infectious/inflammatory process and upper lungs, and significantly enlarged right gland extended into superior mediastinum (presents in the previous  imaging as well ) is no evidence its position in the trachea, discussed with pulmonary, will proceed with spirometry with graft to evaluate if it is contributing to her wheezing . - Added Robaxin today as she's been complaining of some musckeloskeletal pain from coughing  Essential hypertension -  Better  controlled after increasing clonidine, continue with Norvasc,Avapro, given her hyponatremia I have stopped Maxzide .   ATRIAL FIBRILLATION -  Cardiac pacemaker in situ for bradycardia - coumadin per pharmacy - V paced  Hypokalemia/hypomagnesemia - Repleted,  Hyperkalemia  - Resolved,   Hyponatremia -With some volume overload ,received Lasix 3/16, as well mild SIADH, improving on fluid restrictions  Hyperthyroid - Continue with Tapazole  History of symptomatic bradycardia status post pacemaker placement.    Code Status : Full  Family Communication  : None at bedside  Disposition Plan  : home when stable  Consults  :  None  Procedures  : None  DVT Prophylaxis  :  On warfarin  Lab Results  Component Value Date   PLT 264 06/13/2016    Antibiotics  :    Anti-infectives    Start     Dose/Rate Route Frequency Ordered Stop   06/13/16 1000  azithromycin (ZITHROMAX) tablet 250 mg     250 mg Oral Daily 06/12/16 1406     06/11/16 1000  cefTRIAXone (ROCEPHIN) 1 g in dextrose 5 % 50 mL IVPB     1 g 100 mL/hr over 30 Minutes Intravenous Every 24 hours 06/11/16 0844     06/11/16 1000  azithromycin (ZITHROMAX) 500 mg in dextrose 5 % 250 mL IVPB  Status:  Discontinued     500 mg 250 mL/hr over 60 Minutes Intravenous Every 24 hours 06/11/16 0844 06/12/16 1406   06/09/16 2200  doxycycline (VIBRA-TABS) tablet 100 mg  Status:  Discontinued     100 mg Oral Every 12 hours 06/09/16 1100 06/11/16 0844   06/08/16 0345   doxycycline (VIBRAMYCIN) 100 mg in dextrose 5 % 250 mL IVPB  Status:  Discontinued     100 mg 125 mL/hr over 120 Minutes Intravenous 2 times daily 06/08/16 0327 06/09/16 1100   06/08/16 0115  levofloxacin (LEVAQUIN) IVPB 500 mg     500 mg 100 mL/hr over 60 Minutes Intravenous  Once 06/08/16 0105 06/08/16 0231        Objective:   Vitals:   06/12/16 1911 06/12/16 2108 06/13/16 0518 06/13/16 0738  BP:  114/62 137/77   Pulse:  73 90   Resp:  18 18   Temp:  97.5 F (36.4 C) 97.6 F (36.4 C)   TempSrc:  Oral Oral   SpO2: 95% 96% 93% 97%  Weight:      Height:        Wt Readings from Last 3 Encounters:  06/08/16 70.2 kg (154 lb 12.8 oz)  09/24/15 69.4 kg (153 lb)  05/29/15 70.3 kg (155 lb)     Intake/Output Summary (Last 24 hours) at 06/13/16 1201 Last data filed at 06/13/16 0900  Gross per 24 hour  Intake              780 ml  Output                0 ml  Net              780 ml     Physical Exam  Awake Alert, Oriented X 3, No new F.N deficits, Normal affect Symmetrical Chest wall movement,fair air movement bilaterally, some mild wheezing, RRR,No Gallops,Rubs or new Murmurs, No Parasternal Heave +ve B.Sounds, Abd Soft, No tenderness,  No rebound - guarding or rigidity. No Cyanosis, Clubbing , No new Rash or bruise , no edema    Data Review:    CBC  Recent Labs Lab 06/07/16  2220 06/08/16 0625 06/11/16 0546 06/13/16 0543  WBC 11.6* 12.1* 16.4* 11.3*  HGB 11.0* 10.9* 9.9* 10.7*  HCT 32.5* 32.7* 29.0* 30.6*  PLT 258 252 230 264  MCV 88.6 89.3 87.3 87.7  MCH 30.0 29.8 29.8 30.7  MCHC 33.8 33.3 34.1 35.0  RDW 13.4 13.6 13.9 13.6  LYMPHSABS 1.2 0.3*  --   --   MONOABS 0.8 0.1  --   --   EOSABS 0.1 0.0  --   --   BASOSABS 0.0 0.0  --   --     Chemistries   Recent Labs Lab 06/07/16 2220 06/08/16 0625 06/10/16 0547 06/10/16 1430 06/11/16 0546 06/12/16 0627 06/13/16 0543  NA 134* 133* 127* 125* 128* 128* 129*  K 3.6 3.0* 5.7* 4.6 4.5 3.5 3.7  CL  100* 98* 96* 95* 99* 95* 95*  CO2 29 21* 23 22 24 27 28   GLUCOSE 110* 267* 131* 153* 105* 91 93  BUN 20 23* 32* 29* 26* 23* 20  CREATININE 1.02* 1.26* 1.08* 1.12* 0.93 0.89 0.93  CALCIUM 9.1 9.1 8.6* 8.6* 8.2* 8.2* 8.5*  MG  --  1.6*  --   --   --   --   --   AST 21 31  --   --   --   --   --   ALT 14 14  --   --   --   --   --   ALKPHOS 86 77  --   --   --   --   --   BILITOT 0.5 0.7  --   --   --   --   --    ------------------------------------------------------------------------------------------------------------------ No results for input(s): CHOL, HDL, LDLCALC, TRIG, CHOLHDL, LDLDIRECT in the last 72 hours.  No results found for: HGBA1C ------------------------------------------------------------------------------------------------------------------ No results for input(s): TSH, T4TOTAL, T3FREE, THYROIDAB in the last 72 hours.  Invalid input(s): FREET3 ------------------------------------------------------------------------------------------------------------------ No results for input(s): VITAMINB12, FOLATE, FERRITIN, TIBC, IRON, RETICCTPCT in the last 72 hours.  Coagulation profile  Recent Labs Lab 06/09/16 0553 06/10/16 0547 06/11/16 0546 06/12/16 0627 06/13/16 0543  INR 2.63 3.44 3.59 3.86 3.18    No results for input(s): DDIMER in the last 72 hours.  Cardiac Enzymes  Recent Labs Lab 06/08/16 0625 06/08/16 1242 06/08/16 1712  TROPONINI <0.03 <0.03 <0.03   ------------------------------------------------------------------------------------------------------------------    Component Value Date/Time   BNP 203.9 (H) 06/07/2016 2220    Inpatient Medications  Scheduled Meds: . amLODipine  10 mg Oral q morning - 10a  . azithromycin  250 mg Oral Daily  . budesonide (PULMICORT) nebulizer solution  0.25 mg Nebulization BID  . cefTRIAXone (ROCEPHIN)  IV  1 g Intravenous Q24H  . cloNIDine  0.2 mg Oral BID  . famotidine  40 mg Oral BID  . guaiFENesin  1,200  mg Oral BID  . ipratropium  0.5 mg Nebulization TID  . irbesartan  300 mg Oral q morning - 10a  . levalbuterol  0.63 mg Nebulization TID  . methimazole  5 mg Oral BID  . pantoprazole  40 mg Oral Daily  . potassium chloride  20 mEq Oral Daily  . pravastatin  40 mg Oral q morning - 10a  . predniSONE  30 mg Oral Q breakfast  . sucralfate  1 g Oral TID WC & HS  . Warfarin - Pharmacist Dosing Inpatient   Does not apply q1800   Continuous Infusions: PRN Meds:.acetaminophen **OR** acetaminophen, alum & mag hydroxide-simeth, calcium carbonate,  clonazePAM, guaiFENesin-dextromethorphan, hydrALAZINE, levalbuterol, methocarbamol, ondansetron **OR** ondansetron (ZOFRAN) IV  Micro Results No results found for this or any previous visit (from the past 240 hour(s)).  Radiology Reports Dg Chest 2 View  Result Date: 06/11/2016 CLINICAL DATA:  Shortness of breath. EXAM: CHEST  2 VIEW COMPARISON:  06/07/2016 . FINDINGS: Cardiac pacer with lead tip over the right ventricle. Cardiomegaly with normal pulmonary vascularity. Low lung volumes with mild basilar atelectasis. Small left pleural effusion. No pneumothorax. IMPRESSION: 1. Low lung volumes with mild basilar atelectasis. Small left pleural effusion. 2. Cardiac pacer noted with lead tip over the right ventricle. Electronically Signed   By: Marcello Moores  Register   On: 06/11/2016 10:43   Dg Chest 2 View  Result Date: 06/07/2016 CLINICAL DATA:  Wheezing, SOB, congestion. Worse today. Pacemaker placed 2007. Non smoker EXAM: CHEST  2 VIEW COMPARISON:  05/13/2015 FINDINGS: Cardiac pacemaker. Linear fibrosis in the left lung base is unchanged since prior study. Slight fibrosis also demonstrated in the right lung base, unchanged. Peribronchial thickening centrally suggesting chronic bronchitis. No focal airspace disease or consolidation. Normal heart size and pulmonary vascularity. Calcified and tortuous aorta. Degenerative changes in the spine. IMPRESSION: Chronic  bronchitic changes in chronic scarring in the lung bases. No evidence of active pulmonary disease. Electronically Signed   By: Lucienne Capers M.D.   On: 06/07/2016 22:02   Ct Chest Wo Contrast  Result Date: 06/12/2016 CLINICAL DATA:  83 year old female with shortness of breath, chest pain and productive cough EXAM: CT CHEST WITHOUT CONTRAST TECHNIQUE: Multidetector CT imaging of the chest was performed following the standard protocol without IV contrast. COMPARISON:  Chest x-ray 06/11/2016 FINDINGS: Cardiovascular: Limited evaluation in the absence of intravenous contrast. Conventional 3 vessel arch anatomy. Scattered atherosclerotic calcifications. No aneurysm. Calcifications are also present throughout the coronary arteries. The heart is enlarged, particularly both the left and right atria. Intracardiac defibrillator present with the tip in the right ventricular apex. No pericardial effusion. Mediastinum/Nodes: Heterogeneous Lee enlarged and globular thyroid gland most consistent with thyroid goiter. A large goitrous nodule extends into the upper mediastinum. No additional mediastinal mass or suspicious adenopathy. Small hiatal hernia. Lungs/Pleura: Trace subsegmental atelectasis in the left lower lobe with elevation of the left hemidiaphragm. There are a few small isolated patches of ground-glass attenuation airspace opacity in both upper lungs (right upper lobe on 33 and 36 of series 5 and left upper lobe on image 64 of series 5). Tiny calcified subpleural granuloma in the periphery of the right lower lobe. No follow-up imaging required. No pleural effusion or pulmonary edema. Upper Abdomen: The gallbladder is surgically absent. No intra or extrahepatic biliary ductal dilatation. A circumscribed 1.9 cm low-attenuation lesion in the left hepatic lobe is incompletely characterized in the absence of intravenous contrast but statistically highly likely a benign cyst. No acute abnormalities identified.  Musculoskeletal: No acute fracture or aggressive appearing lytic or blastic osseous lesion. IMPRESSION: 1. There are just a few small patchy foci of ground-glass attenuation airspace opacity in the right and left upper lobes. These findings are nonspecific but most suggestive of an acute infectious or inflammatory process such as early multifocal pneumonia or an atypical/viral respiratory infection. It should be emphasized that the imaging findings are subtle and involve an extreme minority of the overall lung parenchyma. 2. Coronary artery calcifications. 3.  Aortic Atherosclerosis (ICD10-170.0). 4. Large thyroid goiter with extension into the superior mediastinum. 5. Well-positioned intracardiac defibrillator lead. Electronically Signed   By: Dellis Filbert.D.  On: 06/12/2016 11:10     Kay Shippy M.D on 06/13/2016 at 12:01 PM  Between 7am to 7pm - Pager - [phone]  After 7pm go to [email or web address] - password [facility]  [facility] -  Office  [phone]

## 2016-06-13 NOTE — Progress Notes (Signed)
ANTICOAGULATION CONSULT NOTE - Follow Up Consult  Pharmacy Consult for Warfarin Indication: atrial fibrillation  Allergies  Allergen Reactions  . Morphine And Related Nausea And Vomiting    Patient Measurements: Height: 5\' 4"  (162.6 cm) Weight: 154 lb 12.8 oz (70.2 kg) IBW/kg (Calculated) : 54.7  Vital Signs: Temp: 97.6 F (36.4 C) (03/18 0518) Temp Source: Oral (03/18 0518) BP: 137/77 (03/18 0518) Pulse Rate: 90 (03/18 0518)  Labs:  Recent Labs  06/11/16 0546 06/12/16 0627 06/13/16 0543  HGB 9.9*  --  10.7*  HCT 29.0*  --  30.6*  PLT 230  --  264  LABPROT 36.7* 38.9* 33.3*  INR 3.59 3.86 3.18  CREATININE 0.93 0.89 0.93    Estimated Creatinine Clearance: 40.2 mL/min (by C-G formula based on SCr of 0.93 mg/dL).   Assessment: Patient with afib and chronic warfarin use, admitted with acute bronchitis. Continuing warfarin inpatient.  INR was subtherapeutic at 1.89 on admission but rose quickly from admission.  Received Levaquin on admission and currently on doxycycline, both likely cause of increased INR.  Hgb decreased to 9.9, platelets WNL.  No bleeding/complications reported.  06/13/2016 INR supratherapeutic at 3.18 H/H low but stable, Plts WNL.  DI: azithromycin can increase INR No bleeding reported Consuming < 75% of meals  Goal of Therapy:  INR 2-3   Plan:  Hold warfarin today. Daily INR.  Dolly Rias RPh 06/13/2016, 9:34 AM Pager [phone]

## 2016-06-14 ENCOUNTER — Inpatient Hospital Stay (HOSPITAL_COMMUNITY): Payer: Medicare Other

## 2016-06-14 ENCOUNTER — Encounter (HOSPITAL_COMMUNITY): Payer: Medicare Other

## 2016-06-14 ENCOUNTER — Other Ambulatory Visit (HOSPITAL_COMMUNITY): Payer: Self-pay | Admitting: Respiratory Therapy

## 2016-06-14 DIAGNOSIS — R0602 Shortness of breath: Secondary | ICD-10-CM

## 2016-06-14 DIAGNOSIS — I480 Paroxysmal atrial fibrillation: Secondary | ICD-10-CM

## 2016-06-14 LAB — BASIC METABOLIC PANEL
Anion gap: 6 (ref 5–15)
BUN: 18 mg/dL (ref 6–20)
CHLORIDE: 99 mmol/L — AB (ref 101–111)
CO2: 28 mmol/L (ref 22–32)
CREATININE: 0.9 mg/dL (ref 0.44–1.00)
Calcium: 8.5 mg/dL — ABNORMAL LOW (ref 8.9–10.3)
GFR calc Af Amer: >60 mL/min (ref >60)
GFR, EST NON AFRICAN AMERICAN: 55 mL/min — AB (ref >60)
Glucose, Bld: 81 mg/dL (ref 65–99)
POTASSIUM: 3.8 mmol/L (ref 3.5–5.1)
SODIUM: 133 mmol/L — AB (ref 135–145)

## 2016-06-14 LAB — PROTIME-INR
INR: 2.76
Prothrombin Time: 29.7 seconds — ABNORMAL HIGH (ref 11.4–15.2)

## 2016-06-14 LAB — PROCALCITONIN: Procalcitonin: <0.1 ng/mL

## 2016-06-14 MED ORDER — CLONIDINE HCL 0.1 MG PO TABS
0.1000 mg | ORAL_TABLET | Freq: Two times a day (BID) | ORAL | Status: DC
  Administered 2016-06-14 – 2016-06-15 (×2): 0.1 mg via ORAL
  Filled 2016-06-14 (×2): qty 1

## 2016-06-14 MED ORDER — SALINE SPRAY 0.65 % NA SOLN
1.0000 | Freq: Four times a day (QID) | NASAL | Status: DC
  Administered 2016-06-14 – 2016-06-15 (×2): 1 via NASAL
  Filled 2016-06-14: qty 44

## 2016-06-14 MED ORDER — SODIUM CHLORIDE 0.9 % IV BOLUS (SEPSIS)
250.0000 mL | Freq: Once | INTRAVENOUS | Status: AC
  Administered 2016-06-14: 250 mL via INTRAVENOUS

## 2016-06-14 MED ORDER — OXYMETAZOLINE HCL 0.05 % NA SOLN
1.0000 | Freq: Two times a day (BID) | NASAL | Status: DC
Start: 2016-06-14 — End: 2016-06-15
  Administered 2016-06-14: 1 via NASAL
  Filled 2016-06-14: qty 15

## 2016-06-14 MED ORDER — SENNOSIDES-DOCUSATE SODIUM 8.6-50 MG PO TABS
2.0000 | ORAL_TABLET | Freq: Two times a day (BID) | ORAL | Status: DC
  Administered 2016-06-14 – 2016-06-15 (×3): 2 via ORAL
  Filled 2016-06-14 (×3): qty 2

## 2016-06-14 MED ORDER — WARFARIN SODIUM 2.5 MG PO TABS
2.5000 mg | ORAL_TABLET | Freq: Once | ORAL | Status: AC
  Administered 2016-06-14: 2.5 mg via ORAL
  Filled 2016-06-14: qty 1

## 2016-06-14 MED ORDER — HYDROCOD POLST-CPM POLST ER 10-8 MG/5ML PO SUER
5.0000 mL | Freq: Two times a day (BID) | ORAL | Status: DC
  Administered 2016-06-14 – 2016-06-15 (×2): 5 mL via ORAL
  Filled 2016-06-14 (×2): qty 5

## 2016-06-14 MED ORDER — FLEET ENEMA 7-19 GM/118ML RE ENEM
1.0000 | ENEMA | Freq: Once | RECTAL | Status: AC
  Administered 2016-06-14: 1 via RECTAL
  Filled 2016-06-14: qty 1

## 2016-06-14 MED ORDER — AMLODIPINE BESYLATE 5 MG PO TABS
5.0000 mg | ORAL_TABLET | Freq: Every morning | ORAL | Status: DC
  Administered 2016-06-15: 5 mg via ORAL
  Filled 2016-06-14: qty 1

## 2016-06-14 MED ORDER — SODIUM CHLORIDE 0.9 % IV SOLN
INTRAVENOUS | Status: AC
  Administered 2016-06-14: 16:00:00 via INTRAVENOUS

## 2016-06-14 NOTE — Progress Notes (Signed)
ANTICOAGULATION CONSULT NOTE - Follow Up Consult  Pharmacy Consult for Warfarin Indication: atrial fibrillation  Allergies  Allergen Reactions  . Morphine And Related Nausea And Vomiting    Patient Measurements: Height: 5\' 4"  (162.6 cm) Weight: 154 lb 12.8 oz (70.2 kg) IBW/kg (Calculated) : 54.7  Vital Signs: Temp: 97.6 F (36.4 C) (03/19 0310) Temp Source: Oral (03/19 0310) BP: 143/64 (03/19 0310) Pulse Rate: 60 (03/19 0310)  Labs:  Recent Labs  06/12/16 0627 06/13/16 0543 06/14/16 0553  HGB  --  10.7*  --   HCT  --  30.6*  --   PLT  --  264  --   LABPROT 38.9* 33.3* 29.7*  INR 3.86 3.18 2.76  CREATININE 0.89 0.93 0.90    Estimated Creatinine Clearance: 41.5 mL/min (by C-G formula based on SCr of 0.9 mg/dL).   Assessment: Patient with afib and chronic warfarin use, admitted with acute bronchitis. Continuing warfarin inpatient.  INR was subtherapeutic at 1.89 on admission but rose quickly from admission.  Received Levaquin on admission and doxycycline, both likely cause of increased INR.  No bleeding complications reported. Dose PTA: 4 mg Monday and 2 mg all other days.  Today, 06/14/2016: INR therapeutic at 2.76 after being held 4 days, expect to keep dropping H/H low but stable, Plts WNL.  DI: azithromycin can increase INR No bleeding reported  Goal of Therapy:  INR 2-3   Plan:  Coumadin 2.5 mg po x 1 dose Daily INR.  Eudelia Bunch, Pharm.D. [phone] 06/14/2016 8:28 AM

## 2016-06-14 NOTE — Progress Notes (Signed)
[id number] Kacen Mellinger,BSN,RN3,CCM/[phone]/Patient would like to use [facility] for home care needs.

## 2016-06-14 NOTE — Progress Notes (Signed)
Pt attempted to work with PT. Pt became diaphoretic and dizziness. MD was notified of orthostatic vital signs. 250 ml bolus was given. Bp 102/68. Pt stable.  Anaeli Cornwall W Lateka Rady, RN

## 2016-06-14 NOTE — Progress Notes (Signed)
PROGRESS NOTE                                                                                                                                                                                                             Patient Demographics:    [name], is a 83 y.o. female, DOB - 07/10/1925, TDS:[id number]  Admit date - 06/07/2016   Admitting Physician Rise Patience, MD  Outpatient Primary MD for the patient is [name], MD  LOS - 6   Chief Complaint  Patient presents with  . Shortness of Breath       Brief Narrative   83 y.o. female with history of atrial fibrillation, symptomatic bradycardia status post pacemaker placement, hypertension and hyperthyroidism presents to the ER with complaints of shortness of breath and wheezing, she was admitted for acute bronchitis, Given significant wheezing she started on steroids as well, CT chest with only small areas of opacity inflammation versus mild infection, a shunt with hyponatremia, most likely due to SIADH, improving with fluid restriction.  Subjective:    [name] today has, No headache, No chest pain, No abdominal pain - No Nausea,Patient with an episode of dizziness/lightheadedness upon standing with PT, she has loose bowel movement, complaining of abdominal pain after receiving Fleet Enema.  Assessment  & Plan :    Principal Problem:   Acute bronchitis Active Problems:   Essential hypertension   ATRIAL FIBRILLATION   Cardiac pacemaker in situ   Fall   Acute bronchitis With wheezing and bronchospasm - no h/o COPD or asthma, Presents with cough, significant wheezing, no evidence of active pulmonary disease and chest x-ray, this is most likely acute bronchitis with bronchospasm,  - cont Pulmicort,  Xopenex, Atrovent. - Doxycycline changed to Rocephin and azithromycin given significant cough or wheezing and productive sputum , - patient reports her cough and wheezing is  more pronounced overnight and in morning , I do suspect significant reflux contributing to her symptoms , already on Pepcid, Protonix, will add Cipro fate today . - CT chest was obtained today given persistent wheezing , evidence of small infectious/inflammatory process and upper lungs, and significantly enlarged right gland extended into superior mediastinum (presents in the previous  imaging as well ) is no evidence its position in the trachea, discussed with pulmonary, will proceed with spirometry with  graft to evaluate if it is contributing to her wheezing , pulmonary service consulted for further evaluation regarding patient's persistent wheezing. - Added Robaxin  as she's been complaining of some musckeloskeletal pain from coughing  Essential hypertension -  Better controlled after increasing clonidine, continue with Norvasc,Avapro, given her hyponatremia I have stopped Maxzide . - Patient with low blood pressure upon standing today, she did respond to fluid bolus, I'll decrease her clonidine to her home dose 0.1 mg twice a day, as well will decrease Norvasc to 5 mg oral daily.   ATRIAL FIBRILLATION -  Cardiac pacemaker in situ for bradycardia - coumadin per pharmacy - V paced  Hypokalemia/hypomagnesemia - Repleted,  Hyperkalemia  - Resolved,   Hyponatremia -With some volume overload ,received Lasix 3/16, as well mild SIADH, improving on fluid restrictions  Hyperthyroid - Continue with Tapazole  History of symptomatic bradycardia status post pacemaker placement. Will Obtain abdominal x-ray to evaluate for patient did not pain.   Code Status : Full  Family Communication  : None at bedside  Disposition Plan  : home when stable  Consults  :  None  Procedures  : None  DVT Prophylaxis  :  On warfarin  Lab Results  Component Value Date   PLT 264 06/13/2016    Antibiotics  :    Anti-infectives    Start     Dose/Rate Route Frequency Ordered Stop   06/13/16 1000   azithromycin (ZITHROMAX) tablet 250 mg     250 mg Oral Daily 06/12/16 1406     06/11/16 1000  cefTRIAXone (ROCEPHIN) 1 g in dextrose 5 % 50 mL IVPB     1 g 100 mL/hr over 30 Minutes Intravenous Every 24 hours 06/11/16 0844     06/11/16 1000  azithromycin (ZITHROMAX) 500 mg in dextrose 5 % 250 mL IVPB  Status:  Discontinued     500 mg 250 mL/hr over 60 Minutes Intravenous Every 24 hours 06/11/16 0844 06/12/16 1406   06/09/16 2200  doxycycline (VIBRA-TABS) tablet 100 mg  Status:  Discontinued     100 mg Oral Every 12 hours 06/09/16 1100 06/11/16 0844   06/08/16 0345  doxycycline (VIBRAMYCIN) 100 mg in dextrose 5 % 250 mL IVPB  Status:  Discontinued     100 mg 125 mL/hr over 120 Minutes Intravenous 2 times daily 06/08/16 0327 06/09/16 1100   06/08/16 0115  levofloxacin (LEVAQUIN) IVPB 500 mg     500 mg 100 mL/hr over 60 Minutes Intravenous  Once 06/08/16 0105 06/08/16 0231        Objective:   Vitals:   06/14/16 0310 06/14/16 0906 06/14/16 1230 06/14/16 1346  BP: (!) 143/64  102/60 (!) 114/56  Pulse: 60 73  61  Resp: 20 16  18   Temp: 97.6 F (36.4 C)   97.4 F (36.3 C)  TempSrc: Oral   Oral  SpO2: 99% 95%  93%  Weight:      Height:        Wt Readings from Last 3 Encounters:  06/08/16 70.2 kg (154 lb 12.8 oz)  09/24/15 69.4 kg (153 lb)  05/29/15 70.3 kg (155 lb)     Intake/Output Summary (Last 24 hours) at 06/14/16 1535 Last data filed at 06/14/16 1034  Gross per 24 hour  Intake              770 ml  Output              150 ml  Net  620 ml     Physical Exam  Awake Alert, Oriented X 3, No new F.N deficits, Normal affect Symmetrical Chest wall movement,fair air movement bilaterally, some mild wheezing, RRR,No Gallops,Rubs or new Murmurs, No Parasternal Heave +ve B.Sounds, Abd Soft, No tenderness,  No rebound - guarding or rigidity. No Cyanosis, Clubbing , No new Rash or bruise , no edema    Data Review:    CBC  Recent Labs Lab 06/07/16 2220  06/08/16 0625 06/11/16 0546 06/13/16 0543  WBC 11.6* 12.1* 16.4* 11.3*  HGB 11.0* 10.9* 9.9* 10.7*  HCT 32.5* 32.7* 29.0* 30.6*  PLT 258 252 230 264  MCV 88.6 89.3 87.3 87.7  MCH 30.0 29.8 29.8 30.7  MCHC 33.8 33.3 34.1 35.0  RDW 13.4 13.6 13.9 13.6  LYMPHSABS 1.2 0.3*  --   --   MONOABS 0.8 0.1  --   --   EOSABS 0.1 0.0  --   --   BASOSABS 0.0 0.0  --   --     Chemistries   Recent Labs Lab 06/07/16 2220 06/08/16 0625  06/10/16 1430 06/11/16 0546 06/12/16 0627 06/13/16 0543 06/14/16 0553  NA 134* 133*  < > 125* 128* 128* 129* 133*  K 3.6 3.0*  < > 4.6 4.5 3.5 3.7 3.8  CL 100* 98*  < > 95* 99* 95* 95* 99*  CO2 29 21*  < > 22 24 27 28 28   GLUCOSE 110* 267*  < > 153* 105* 91 93 81  BUN 20 23*  < > 29* 26* 23* 20 18  CREATININE 1.02* 1.26*  < > 1.12* 0.93 0.89 0.93 0.90  CALCIUM 9.1 9.1  < > 8.6* 8.2* 8.2* 8.5* 8.5*  MG  --  1.6*  --   --   --   --   --   --   AST 21 31  --   --   --   --   --   --   ALT 14 14  --   --   --   --   --   --   ALKPHOS 86 77  --   --   --   --   --   --   BILITOT 0.5 0.7  --   --   --   --   --   --   < > = values in this interval not displayed. ------------------------------------------------------------------------------------------------------------------ No results for input(s): CHOL, HDL, LDLCALC, TRIG, CHOLHDL, LDLDIRECT in the last 72 hours.  No results found for: HGBA1C ------------------------------------------------------------------------------------------------------------------ No results for input(s): TSH, T4TOTAL, T3FREE, THYROIDAB in the last 72 hours.  Invalid input(s): FREET3 ------------------------------------------------------------------------------------------------------------------ No results for input(s): VITAMINB12, FOLATE, FERRITIN, TIBC, IRON, RETICCTPCT in the last 72 hours.  Coagulation profile  Recent Labs Lab 06/10/16 0547 06/11/16 0546 06/12/16 0627 06/13/16 0543 06/14/16 0553  INR 3.44 3.59  3.86 3.18 2.76    No results for input(s): DDIMER in the last 72 hours.  Cardiac Enzymes  Recent Labs Lab 06/08/16 0625 06/08/16 1242 06/08/16 1712  TROPONINI <0.03 <0.03 <0.03   ------------------------------------------------------------------------------------------------------------------    Component Value Date/Time   BNP 203.9 (H) 06/07/2016 2220    Inpatient Medications  Scheduled Meds: . amLODipine  10 mg Oral q morning - 10a  . azithromycin  250 mg Oral Daily  . budesonide (PULMICORT) nebulizer solution  0.25 mg Nebulization BID  . cefTRIAXone (ROCEPHIN)  IV  1 g Intravenous Q24H  . cloNIDine  0.2 mg Oral BID  .  guaiFENesin  1,200 mg Oral BID  . ipratropium  0.5 mg Nebulization TID  . irbesartan  300 mg Oral q morning - 10a  . levalbuterol  0.63 mg Nebulization TID  . methimazole  5 mg Oral BID  . pantoprazole  40 mg Oral Daily  . potassium chloride  20 mEq Oral Daily  . pravastatin  40 mg Oral q morning - 10a  . predniSONE  30 mg Oral Q breakfast  . senna-docusate  2 tablet Oral BID  . sucralfate  1 g Oral TID WC & HS  . warfarin  2.5 mg Oral ONCE-1800  . Warfarin - Pharmacist Dosing Inpatient   Does not apply q1800   Continuous Infusions: PRN Meds:.acetaminophen **OR** acetaminophen, alum & mag hydroxide-simeth, calcium carbonate, clonazePAM, guaiFENesin-dextromethorphan, hydrALAZINE, levalbuterol, methocarbamol, ondansetron **OR** ondansetron (ZOFRAN) IV  Micro Results No results found for this or any previous visit (from the past 240 hour(s)).  Radiology Reports Dg Chest 2 View  Result Date: 06/11/2016 CLINICAL DATA:  Shortness of breath. EXAM: CHEST  2 VIEW COMPARISON:  06/07/2016 . FINDINGS: Cardiac pacer with lead tip over the right ventricle. Cardiomegaly with normal pulmonary vascularity. Low lung volumes with mild basilar atelectasis. Small left pleural effusion. No pneumothorax. IMPRESSION: 1. Low lung volumes with mild basilar atelectasis. Small  left pleural effusion. 2. Cardiac pacer noted with lead tip over the right ventricle. Electronically Signed   By: Marcello Moores  Register   On: 06/11/2016 10:43   Dg Chest 2 View  Result Date: 06/07/2016 CLINICAL DATA:  Wheezing, SOB, congestion. Worse today. Pacemaker placed 2007. Non smoker EXAM: CHEST  2 VIEW COMPARISON:  05/13/2015 FINDINGS: Cardiac pacemaker. Linear fibrosis in the left lung base is unchanged since prior study. Slight fibrosis also demonstrated in the right lung base, unchanged. Peribronchial thickening centrally suggesting chronic bronchitis. No focal airspace disease or consolidation. Normal heart size and pulmonary vascularity. Calcified and tortuous aorta. Degenerative changes in the spine. IMPRESSION: Chronic bronchitic changes in chronic scarring in the lung bases. No evidence of active pulmonary disease. Electronically Signed   By: Lucienne Capers M.D.   On: 06/07/2016 22:02   Ct Chest Wo Contrast  Result Date: 06/12/2016 CLINICAL DATA:  83 year old female with shortness of breath, chest pain and productive cough EXAM: CT CHEST WITHOUT CONTRAST TECHNIQUE: Multidetector CT imaging of the chest was performed following the standard protocol without IV contrast. COMPARISON:  Chest x-ray 06/11/2016 FINDINGS: Cardiovascular: Limited evaluation in the absence of intravenous contrast. Conventional 3 vessel arch anatomy. Scattered atherosclerotic calcifications. No aneurysm. Calcifications are also present throughout the coronary arteries. The heart is enlarged, particularly both the left and right atria. Intracardiac defibrillator present with the tip in the right ventricular apex. No pericardial effusion. Mediastinum/Nodes: Heterogeneous Lee enlarged and globular thyroid gland most consistent with thyroid goiter. A large goitrous nodule extends into the upper mediastinum. No additional mediastinal mass or suspicious adenopathy. Small hiatal hernia. Lungs/Pleura: Trace subsegmental atelectasis  in the left lower lobe with elevation of the left hemidiaphragm. There are a few small isolated patches of ground-glass attenuation airspace opacity in both upper lungs (right upper lobe on 33 and 36 of series 5 and left upper lobe on image 64 of series 5). Tiny calcified subpleural granuloma in the periphery of the right lower lobe. No follow-up imaging required. No pleural effusion or pulmonary edema. Upper Abdomen: The gallbladder is surgically absent. No intra or extrahepatic biliary ductal dilatation. A circumscribed 1.9 cm low-attenuation lesion in the left hepatic lobe is  incompletely characterized in the absence of intravenous contrast but statistically highly likely a benign cyst. No acute abnormalities identified. Musculoskeletal: No acute fracture or aggressive appearing lytic or blastic osseous lesion. IMPRESSION: 1. There are just a few small patchy foci of ground-glass attenuation airspace opacity in the right and left upper lobes. These findings are nonspecific but most suggestive of an acute infectious or inflammatory process such as early multifocal pneumonia or an atypical/viral respiratory infection. It should be emphasized that the imaging findings are subtle and involve an extreme minority of the overall lung parenchyma. 2. Coronary artery calcifications. 3.  Aortic Atherosclerosis (ICD10-170.0). 4. Large thyroid goiter with extension into the superior mediastinum. 5. Well-positioned intracardiac defibrillator lead. Electronically Signed   By: Jacqulynn Cadet M.D.   On: 06/12/2016 11:10     ELGERGAWY, DAWOOD M.D on 06/14/2016 at 3:35 PM  Between 7am to 7pm - Pager - [phone]  After 7pm go to [email or web address] - password [facility]  [facility] -  Office  [phone]

## 2016-06-14 NOTE — Progress Notes (Signed)
Went to get patient to bring down to PFT lab for a basic spirometry test. Pt was complaining of dizziness & said she couldn't get in a wheelchair right now to come to lab. RN said she would page MD & make him aware.   Kathie Dike RRT

## 2016-06-14 NOTE — Progress Notes (Signed)
Physical Therapy Treatment Patient Details Name: [name] MRN: [id number] DOB: [birth year] Today's Date: 06/14/2016    History of Present Illness 83 y.o. female with history of atrial fibrillation, symptomatic bradycardia status post pacemaker placement, hypertension and hyperthyroidism and was admitted for acute bronchitis    PT Comments    Pt OOB in recliner feeling "okay".  Stated she was going home later today and will have family assist.   When I had pt scoot to edge of recliner to apply a back gown, pt started to c/o "a lot" of nausea and dizziness.  " I just took a bunch of medicine".  Stated pt.  Vitals taken in sitting, BP 110/56, HR 60, RA 95%.  Assisted with standing approx one min to achieve BP 96/41, HR 62, RA 94%.  C/o nausea/dizzy increased in which pt was not able to amb.  Assisted back to bed.  BP supine 106/61, HR 59 and RA 96%.  Reported to RN, pt did not do well and is possibly a D/C today.   Follow Up Recommendations  Home health PT;Supervision for mobility/OOB     Equipment Recommendations  None recommended by PT    Recommendations for Other Services       Precautions / Restrictions Precautions Precautions: Fall Restrictions Weight Bearing Restrictions: No    Mobility  Bed Mobility Overal bed mobility: Needs Assistance             General bed mobility comments: assisted back to bed due to Max c/o nausea and dizziness  Transfers Overall transfer level: Needs assistance Equipment used: Rolling walker (2 wheeled) Transfers: Sit to/from [facility] Sit to Stand: Min assist Stand pivot transfers: Min assist       General transfer comment: required increased assist due to c/o dizziness and nausea  Ambulation/Gait Ambulation/Gait assistance: Min assist;Mod assist Ambulation Distance (Feet): 2 Feet Assistive device: Rolling walker (2 wheeled) Gait Pattern/deviations: Step-to pattern;Step-through pattern     General Gait  Details: only able to take a few steps from recliner to bed due to MAX c/o dizziness and nausea.     Stairs            Wheelchair Mobility    Modified Rankin (Stroke Patients Only)       Balance                                    Cognition Arousal/Alertness: Awake/alert Behavior During Therapy: WFL for tasks assessed/performed Overall Cognitive Status: Within Functional Limits for tasks assessed                      Exercises      General Comments        Pertinent Vitals/Pain Pain Assessment: No/denies pain    Home Living                      Prior Function            PT Goals (current goals can now be found in the care plan section) Progress towards PT goals: Progressing toward goals    Frequency    Min 3X/week      PT Plan Current plan remains appropriate    Co-evaluation             End of Session Equipment Utilized During Treatment: Gait belt Activity Tolerance: Other (comment) (nausea/dizzy ) Patient left:  in bed   PT Visit Diagnosis: Difficulty in walking, not elsewhere classified (R26.2)     Time: 1828-8337 PT Time Calculation (min) (ACUTE ONLY): 24 min  Charges:  $Gait Training: 8-22 mins $Therapeutic Activity: 8-22 mins                    G Codes:       Rica Koyanagi  PTA [facility]  Acute  Rehab Pager      [phone]

## 2016-06-14 NOTE — Consult Note (Signed)
Name: [name] MRN: [id number] DOB: 1925-09-26    ADMISSION DATE:  06/07/2016 CONSULTATION DATE:  3/19  REFERRING MD :  Elgergawy   CHIEF COMPLAINT:  Wheezing   BRIEF PATIENT DESCRIPTION:  This is a 83 year old female w/ no previous h/o COPD. Does have h/o hyperthyroidism, goiter and AF. Was admitted on 3/12 w/ working dx of acute bronchitis w/ associated dyspnea and wheeze. Treated w/ systemic steroids, BDs, abx and later GERD therapy. CT chest obtained showing large thyroid goiter. Wheeze persisting in spite of therapy. PCCM asked to eval   SIGNIFICANT EVENTS    STUDIES:  CT chest 3/18: 1. There are just a few small patchy foci of ground-glass attenuation airspace opacity in the right and left upper lobes.2. Coronary artery calcifications.3.  Aortic Atherosclerosis 4. Large thyroid goiter with extension into the superior mediastinum. 5. Well-positioned intracardiac defibrillator lead. PFTs 3/18>>>  HISTORY OF PRESENT ILLNESS:   This is a 83 year old female who was admitted on 3/13 w/ cc: shortness of breath & wheezing. Presented w/ 3d h/o this w/ associated left sided chest discomfort which was pleuritic in nature.CXR was w/out infiltrate & she was admitted w/ working dx of AECOPD & treated w/ IV solumedrol, doxy and scheduled Nebs. Her cough and wheezing persisted in-spite of this treatment. On 3/18 she was reporting worsening cough and reflux symptoms so aggressive PPI, H2B and Carafate added. Also CT scan was obtained which showed enlarged right thyroid gland which extended into the superior mediastinum. We were asked on 3/18 for un-official consult and recommended PFTs to look for evidence of airway obstruction. These could not be obtained d/t pt complaining of dizziness and could not get up into w/c.  PCCM was asked formally  to see re: persistent wheezing.    PAST MEDICAL HISTORY :   has a past medical history of Anxiety; Atrial fibrillation ([place]); Bradycardia; Complication  of anesthesia; Difficulty sleeping; Dysrhythmia; H/O hiatal hernia; History of kidney stones; History of skin cancer; History of transfusion; Hypertension; Osteoarthritis; Pacemaker; Pneumonia; PONV (postoperative nausea and vomiting); and Shortness of breath.  has a past surgical history that includes Microdiskectomy (05/2004); Arthroplasty (1994); Arthroplasty (2000); DG SELECTED HSG GDC ONLY (2002); Appendectomy (1960); Total abdominal hysterectomy (1969); Pacemaker insertion; Joint replacement; Cholecystectomy; Back surgery; and Total hip arthroplasty (Right, 12/24/2013).   Current Facility-Administered Medications:  .  acetaminophen (TYLENOL) tablet 650 mg, 650 mg, Oral, Q6H PRN, 650 mg  .  alum & mag hydroxide-simeth (MAALOX/MYLANTA) 200-200-20 MG/5ML suspension 30 mL, 30 mL, Oral, Q4H PRN,  .  amLODipine (NORVASC) tablet 10 mg, 10 mg, Oral, q morning - 10a,  .  azithromycin (ZITHROMAX) tablet 250 mg, 250 mg, Oral, Daily, 06/14/16 1034 .  budesonide (PULMICORT) nebulizer solution 0.25 mg, 0.25 mg, Nebulization, BID .  calcium carbonate (TUMS - dosed in mg elemental calcium) chewable tablet 200 mg of elemental calcium, 1 tablet, Oral, BID PRN, .  cefTRIAXone (ROCEPHIN) 1 g in dextrose 5 % 50 mL IVPB, 1 g, Intravenous, Q24H, 1 g at 06/14/16 1034 .  clonazePAM (KLONOPIN) tablet 0.25 mg, 0.25 mg, Oral, Daily PRN,  .  cloNIDine (CATAPRES) tablet 0.2 mg, 0.2 mg, Oral, BID .  guaiFENesin (MUCINEX) 12 hr tablet 1,200 mg, 1,200 mg, Oral, BID,  06/14/16 1033 .  guaiFENesin-dextromethorphan (ROBITUSSIN DM) 100-10 MG/5ML syrup 5 mL, 5 mL, Oral, Q4H PRN .  hydrALAZINE (APRESOLINE) injection 10 mg, 10 mg, Intravenous, Q4H PRN .  ipratropium (ATROVENT) nebulizer solution 0.5 mg, 0.5  mg, Nebulization, TID, .  irbesartan (AVAPRO) tablet 300 mg, 300 mg, Oral, q morning - 10a,  .  levalbuterol (XOPENEX) nebulizer solution 0.63 mg, 0.63 mg, Nebulization, Q6H PRN,  .  levalbuterol (XOPENEX) nebulizer solution  0.63 mg, 0.63 mg, Nebulization, TID, Debbe Odea, MD, 0.63 mg at 06/14/16 0906 .  methimazole (TAPAZOLE) tablet 5 mg, 5 mg, Oral, BID, Rise Patience, MD, 5 mg at 06/14/16 1034 .  methocarbamol (ROBAXIN) tablet 500 mg, 500 mg, Oral, Q6H PRN,  .  ondansetron (ZOFRAN) tablet 4 mg, 4 mg, Oral, Q6H PRN **OR** ondansetron (ZOFRAN) injection 4 mg, 4 mg, Intravenous, Q6H PRN, 24 .  pantoprazole (PROTONIX) EC tablet 40 mg, 40 mg, Oral, Daily,  .  potassium chloride SA (K-DUR,KLOR-CON) CR tablet 20 mEq, 20 mEq, Oral, Daily .  pravastatin (PRAVACHOL) tablet 40 mg, 40 mg, Oral, q morning - 10a .  predniSONE (DELTASONE) tablet 30 mg, 30 mg, Oral, Q breakfast, .  senna-docusate (Senokot-S) tablet 2 tablet, 2 tablet, Oral, BID, .  sodium chloride 0.9 % bolus 250 mL, 250 mL, Intravenous, Once, Albertine Patricia, MD, 250 mL at 06/14/16 1257 .  sucralfate (CARAFATE) 1 GM/10ML suspension 1 g, 1 g, Oral, TID WC & HS, Albertine Patricia, MD, 1 g at 06/14/16 1034 .  warfarin (COUMADIN) tablet 2.5 mg, 2.5 mg, Oral, ONCE-1800,    Allergies  Allergen Reactions  . Morphine And Related Nausea And Vomiting    FAMILY HISTORY:  family history includes Heart disease in her mother; Leukemia in her father. SOCIAL HISTORY:  reports that she quit smoking about 42 years ago. She has quit using smokeless tobacco. She reports that she does not drink alcohol or use drugs.  REVIEW OF SYSTEMS:   Constitutional: Negative for fever, chills, weight loss, malaise/fatigue and diaphoresis.  HENT: + hearing loss left>right, ear pain, nosebleeds,+ congestion, sore throat, neck pain, tinnitus and ear discharge.   Eyes: Negative for blurred vision, double vision, photophobia, pain, discharge and redness.  Respiratory: + cough, no hemoptysis, sputum production yellow tinged, shortness of breath, wheezing and stridor.   Cardiovascular: Negative for chest pain, palpitations, orthopnea, claudication, leg swelling and PND.    Gastrointestinal:  Heartburn improved, nausea, vomiting, abdominal pain, diarrhea, constipation, blood in stool and melena.  Genitourinary: Negative for dysuria, urgency, frequency, hematuria and flank pain.  Musculoskeletal: Negative for myalgias, back pain, joint pain and falls.  Skin: Negative for itching and rash.  Neurological: Negative for dizziness, tingling, tremors, sensory change, speech change, focal weakness, seizures, loss of consciousness, weakness and headaches.  Endo/Heme/Allergies: Negative for environmental allergies and polydipsia. Does not bruise/bleed easily.  SUBJECTIVE:  Feels about the same  VITAL SIGNS: Temp:  [97.4 F (36.3 C)-97.8 F (36.6 C)] 97.4 F (36.3 C) (03/19 1346) Pulse Rate:  [60-73] 61 (03/19 1346) Resp:  [16-20] 18 (03/19 1346) BP: (102-143)/(56-82) 114/56 (03/19 1346) SpO2:  [93 %-99 %] 93 % (03/19 1346)  PHYSICAL EXAMINATION: General: 83 year old well nourished female in no distress Neuro:  Awake, alert, no focal def HEENT:  NCAT. + nasal gtt. MMM, tongue dry. Upper airway wheeze on exhalation  Cardiovascular:  RRR Lungs:  Mostly transmitted UAW wheeze. Possible Lower airway involvement w/ occ rhonchi  Abdomen:  Soft, not tender + bowel sounds  Musculoskeletal:  Soft not tender + bowel sounds  Skin:  Warm and dry   CBC Recent Labs     06/13/16  0543  WBC  11.3*  HGB  10.7*  HCT  30.6*  PLT  264    Coag's Recent Labs     06/12/16  0627  06/13/16  0543  06/14/16  0553  INR  3.86  3.18  2.76    BMET Recent Labs     06/12/16  0627  06/13/16  0543  06/14/16  0553  NA  128*  129*  133*  K  3.5  3.7  3.8  CL  95*  95*  99*  CO2  27  28  28   BUN  23*  20  18  CREATININE  0.89  0.93  0.90  GLUCOSE  91  93  81    Electrolytes Recent Labs     06/12/16  0627  06/13/16  0543  06/14/16  0553  CALCIUM  8.2*  8.5*  8.5*    Sepsis Markers No results for input(s): PROCALCITON, O2SATVEN in the last 72 hours.  Invalid  input(s): LACTICACIDVEN  ABG No results for input(s): PHART, PCO2ART, PO2ART in the last 72 hours.  Liver Enzymes No results for input(s): AST, ALT, ALKPHOS, BILITOT, ALBUMIN in the last 72 hours.  Cardiac Enzymes No results for input(s): TROPONINI, PROBNP in the last 72 hours.  Glucose No results for input(s): GLUCAP in the last 72 hours.  Imaging No results found. Personally reviewed. Very minimal GG left LL. Small area on right .   No results found.  ASSESSMENT AND PLAN  Acute Bronchitis Possible CAP Acute sinusitis laryngopharyngeal reflux disease w/ upper airway wheezing Post-nasal gtt GERD Dizziness/vertigo   Discussion On-going upper airway wheeze in setting of severe upper airway irritation likely d/t acute sinusitis and persistent post-nasal gtt. Seems like the reflux component has been addressed. The presence of vertigo would not be uncommon w/ inner ear involvement as a complication of recent sinusitis. There is no Stridor as the wheezing is only on exhalation.   Plan/rec CT sinuses PFTs to look at flow volume loop to better characterize her obstruction  Cont reflux rx Cont pred Cont BDs Add scheduled tussionex as has cough suppressant AND 1st gen H2B Add nasal saline/Afrin/flonase Cont abx (will send PCT) Add flutter  All other issues per primary   Erick Colace ACNP-BC [place] Pager # [phone] OR # [phone] if no answer  06/14/2016, 2:15 PM

## 2016-06-14 NOTE — Care Management Important Message (Signed)
Important Message  Patient Details  Name: HAWA HENLY MRN: [id number] Date of Birth: 01-04-1926   Medicare Important Message Given:  Yes    Kerin Salen 06/14/2016, 12:40 [place] Message  Patient Details  Name: LOUINE TENPENNY MRN: [id number] Date of Birth: 1925/09/06   Medicare Important Message Given:  Yes    Kerin Salen 06/14/2016, 12:40 PM

## 2016-06-15 ENCOUNTER — Inpatient Hospital Stay (HOSPITAL_COMMUNITY): Payer: Medicare Other

## 2016-06-15 ENCOUNTER — Encounter (HOSPITAL_COMMUNITY): Payer: Self-pay | Admitting: Radiology

## 2016-06-15 DIAGNOSIS — J9600 Acute respiratory failure, unspecified whether with hypoxia or hypercapnia: Secondary | ICD-10-CM

## 2016-06-15 LAB — BASIC METABOLIC PANEL
ANION GAP: 6 (ref 5–15)
BUN: 15 mg/dL (ref 6–20)
CO2: 24 mmol/L (ref 22–32)
Calcium: 8 mg/dL — ABNORMAL LOW (ref 8.9–10.3)
Chloride: 102 mmol/L (ref 101–111)
Creatinine, Ser: 0.75 mg/dL (ref 0.44–1.00)
GFR calc Af Amer: >60 mL/min (ref >60)
GLUCOSE: 83 mg/dL (ref 65–99)
POTASSIUM: 4.2 mmol/L (ref 3.5–5.1)
Sodium: 132 mmol/L — ABNORMAL LOW (ref 135–145)

## 2016-06-15 LAB — CBC
HEMATOCRIT: 29.9 % — AB (ref 36.0–46.0)
Hemoglobin: 10.2 g/dL — ABNORMAL LOW (ref 12.0–15.0)
MCH: 29.6 pg (ref 26.0–34.0)
MCHC: 34.1 g/dL (ref 30.0–36.0)
MCV: 86.7 fL (ref 78.0–100.0)
PLATELETS: 267 10*3/uL (ref 150–400)
RBC: 3.45 MIL/uL — AB (ref 3.87–5.11)
RDW: 13.8 % (ref 11.5–15.5)
WBC: 13.8 10*3/uL — AB (ref 4.0–10.5)

## 2016-06-15 LAB — SPIROMETRY WITH GRAPH
FEF 25-75 PRE: 0.77 L/s
FEF2575-%PRED-PRE: 81 %
FEV1-%PRED-PRE: 64 %
FEV1-Pre: 1.04 L
FEV1FVC-%Pred-Pre: 102 %
FEV6-%Pred-Pre: 68 %
FEV6-Pre: 1.41 L
FEV6FVC-%PRED-PRE: 106 %
FVC-%Pred-Pre: 63 %
FVC-Pre: 1.41 L
PRE FEV6/FVC RATIO: 100 %
Pre FEV1/FVC ratio: 74 %

## 2016-06-15 LAB — PROTIME-INR
INR: 3.01
Prothrombin Time: 31.8 seconds — ABNORMAL HIGH (ref 11.4–15.2)

## 2016-06-15 MED ORDER — SUCRALFATE 1 G PO TABS: 1.0000 g | ORAL_TABLET | Freq: Three times a day (TID) | ORAL | 0 refills | Status: DC

## 2016-06-15 MED ORDER — HYDROCOD POLST-CPM POLST ER 10-8 MG/5ML PO SUER: 5.0000 mL | Freq: Two times a day (BID) | ORAL | Status: DC | PRN

## 2016-06-15 MED ORDER — ALBUTEROL SULFATE (2.5 MG/3ML) 0.083% IN NEBU: 2.5000 mg | INHALATION_SOLUTION | RESPIRATORY_TRACT | 1 refills | Status: DC | PRN

## 2016-06-15 MED ORDER — WARFARIN SODIUM 1 MG PO TABS
1.0000 mg | ORAL_TABLET | Freq: Once | ORAL | Status: AC
  Administered 2016-06-15: 1 mg via ORAL
  Filled 2016-06-15: qty 1

## 2016-06-15 MED ORDER — FLUTICASONE PROPIONATE 50 MCG/ACT NA SUSP
2.0000 | Freq: Every day | NASAL | Status: DC
  Administered 2016-06-15: 2 via NASAL
  Filled 2016-06-15: qty 16

## 2016-06-15 MED ORDER — OXYMETAZOLINE HCL 0.05 % NA SOLN: 1.0000 | Freq: Two times a day (BID) | NASAL | 0 refills | Status: AC

## 2016-06-15 MED ORDER — SALINE SPRAY 0.65 % NA SOLN: 1.0000 | Freq: Four times a day (QID) | NASAL | 0 refills | Status: DC

## 2016-06-15 MED ORDER — PANTOPRAZOLE SODIUM 40 MG PO TBEC: 40.0000 mg | DELAYED_RELEASE_TABLET | Freq: Every day | ORAL | 1 refills | Status: DC

## 2016-06-15 MED ORDER — DIPHENHYDRAMINE HCL 25 MG PO CAPS
25.0000 mg | ORAL_CAPSULE | Freq: Four times a day (QID) | ORAL | Status: DC
  Administered 2016-06-15: 25 mg via ORAL
  Filled 2016-06-15: qty 1

## 2016-06-15 MED ORDER — DIPHENHYDRAMINE HCL 25 MG PO CAPS: 25.0000 mg | ORAL_CAPSULE | Freq: Three times a day (TID) | ORAL | 0 refills | Status: DC

## 2016-06-15 MED ORDER — GUAIFENESIN-DM 100-10 MG/5ML PO SYRP: 5.0000 mL | ORAL_SOLUTION | ORAL | 0 refills | Status: DC | PRN

## 2016-06-15 MED ORDER — FLUTICASONE PROPIONATE 50 MCG/ACT NA SUSP: 2.0000 | Freq: Every day | NASAL | 2 refills | Status: DC

## 2016-06-15 MED ORDER — PREDNISONE 10 MG PO TABS: ORAL_TABLET | ORAL | 0 refills | Status: DC

## 2016-06-15 NOTE — Progress Notes (Signed)
Pt discharging home with [facility] for HHRN/PT/NA. Referral given to in house rep.

## 2016-06-15 NOTE — Progress Notes (Signed)
ANTICOAGULATION CONSULT NOTE - Follow Up Consult  Pharmacy Consult for Warfarin Indication: atrial fibrillation  Allergies  Allergen Reactions  . Morphine And Related Nausea And Vomiting    Patient Measurements: Height: 5\' 4"  (162.6 cm) Weight: 154 lb 12.8 oz (70.2 kg) IBW/kg (Calculated) : 54.7  Vital Signs: Temp: 98.4 F (36.9 C) (03/20 0554) Temp Source: Oral (03/20 0554) BP: 150/72 (03/20 0554) Pulse Rate: 60 (03/20 0554)  Labs:  Recent Labs  06/13/16 0543 06/14/16 0553 06/15/16 0533  HGB 10.7*  --  10.2*  HCT 30.6*  --  29.9*  PLT 264  --  267  LABPROT 33.3* 29.7* 31.8*  INR 3.18 2.76 3.01  CREATININE 0.93 0.90 0.75    Estimated Creatinine Clearance: 46.7 mL/min (by C-G formula based on SCr of 0.75 mg/dL).   Assessment: Patient with afib and chronic warfarin use, admitted with acute bronchitis. Continuing warfarin inpatient.  INR was subtherapeutic at 1.89 on admission but rose quickly from admission.  Received Levaquin on admission and doxycycline, both likely cause of increased INR.  No bleeding complications reported. Dose PTA: 4 mg Monday and 2 mg all other days.  Today, 06/15/2016: - INR is therapeutic at 3.01, but at the upper end of goal range 3.01 with 2.5 mg given last night (after holding dose for 4 days d/t supratherapeutic INR) - H/H low but stable, Plts WNL.  - no bleeding documented - Drug-drug intxns: abx can increase INR - heart healthy diet  Goal of Therapy:  INR 2-3   Plan:  - warfarin 1 mg po x 1 dose - Daily INR - monitor for s/s bleeding  Dia Sitter, PharmD, BCPS 06/15/2016 8:52 AM

## 2016-06-15 NOTE — Progress Notes (Signed)
Pt discharged from the unit via wheelchair. Discharge instructions were reviewed with the pt. Home health was set up for the pt. Nebulizer delivered to room. No questions or concerns at this time.  Bernadette Armijo W Terina Mcelhinny, RN

## 2016-06-15 NOTE — Discharge Instructions (Signed)
Follow with Primary MD [name], MD in 7 days   Get CBC, CMP,checked  by Primary MD next visit.    Activity: As tolerated with Full fall precautions use walker/cane & assistance as needed   Disposition Home    Diet: Heart Healthy with fluid restriction 1500 mL per day , with feeding assistance and aspiration precautions.  For Heart failure patients - Check your Weight same time everyday, if you gain over 2 pounds, or you develop in leg swelling, experience more shortness of breath or chest pain, call your Primary MD immediately. Follow Cardiac Low Salt Diet and 1.5 lit/day fluid restriction.   On your next visit with your primary care physician please Get Medicines reviewed and adjusted.   Please request your Prim.MD to go over all Hospital Tests and Procedure/Radiological results at the follow up, please get all Hospital records sent to your Prim MD by signing hospital release before you go home.   If you experience worsening of your admission symptoms, develop shortness of breath, life threatening emergency, suicidal or homicidal thoughts you must seek medical attention immediately by calling 911 or calling your MD immediately  if symptoms less severe.  You Must read complete instructions/literature along with all the possible adverse reactions/side effects for all the Medicines you take and that have been prescribed to you. Take any new Medicines after you have completely understood and accpet all the possible adverse reactions/side effects.   Do not drive, operating heavy machinery, perform activities at heights, swimming or participation in water activities or provide baby sitting services if your were admitted for syncope or siezures until you have seen by Primary MD or a Neurologist and advised to do so again.  Do not drive when taking Pain medications.    Do not take more than prescribed Pain, Sleep and Anxiety Medications  Special Instructions: If you have smoked or  chewed Tobacco  in the last 2 yrs please stop smoking, stop any regular Alcohol  and or any Recreational drug use.  Wear Seat belts while driving.   Please note  You were cared for by a hospitalist during your hospital stay. If you have any questions about your discharge medications or the care you received while you were in the hospital after you are discharged, you can call the unit and asked to speak with the hospitalist on call if the hospitalist that took care of you is not available. Once you are discharged, your primary care physician will handle any further medical issues. Please note that NO REFILLS for any discharge medications will be authorized once you are discharged, as it is imperative that you return to your primary care physician (or establish a relationship with a primary care physician if you do not have one) for your aftercare needs so that they can reassess your need for medications and monitor your lab values.

## 2016-06-15 NOTE — Progress Notes (Signed)
Nebulizer treatments held due to patient needing spirometry w/ graph pending. Will administer post spirometry or next scheduled time. RN aware; RT will continue to monitor patient.

## 2016-06-15 NOTE — Consult Note (Addendum)
Name: [name] MRN: [id number] DOB: 09-22-1925    ADMISSION DATE:  06/07/2016 CONSULTATION DATE:  3/19  REFERRING MD :  Elgergawy   CHIEF COMPLAINT:  Wheezing   BRIEF PATIENT DESCRIPTION:  This is a 83 year old female w/ no previous h/o COPD. Does have h/o hyperthyroidism, goiter and AF. Was admitted on 3/12 w/ working dx of acute bronchitis w/ associated dyspnea and wheeze. Treated w/ systemic steroids, BDs, abx and later GERD therapy. CT chest obtained showing large thyroid goiter. Wheeze persisting in spite of therapy. PCCM asked to eval   SIGNIFICANT EVENTS  No major issues ovenight  STUDIES:  CT chest 3/18: 1. There are just a few small patchy foci of ground-glass attenuation airspace opacity in the right and left upper lobes.2. Coronary artery calcifications.3.  Aortic Atherosclerosis 4. Large thyroid goiter with extension into the superior mediastinum. 5. Well-positioned intracardiac defibrillator lead. I have reviewed the images personally  PFTs 3/18>>>  HISTORY OF PRESENT ILLNESS:   This is a 83 year old female who was admitted on 3/13 w/ cc: shortness of breath & wheezing. Presented w/ 3d h/o this w/ associated left sided chest discomfort which was pleuritic in nature.CXR was w/out infiltrate & she was admitted w/ working dx of AECOPD & treated w/ IV solumedrol, doxy and scheduled Nebs. Her cough and wheezing persisted in-spite of this treatment. On 3/18 she was reporting worsening cough and reflux symptoms so aggressive PPI, H2B and Carafate added. Also CT scan was obtained which showed enlarged right thyroid gland which extended into the superior mediastinum. We were asked on 3/18 for un-official consult and recommended PFTs to look for evidence of airway obstruction. These could not be obtained d/t pt complaining of dizziness and could not get up into w/c.  PCCM was asked formally  to see re: persistent wheezing.    PAST MEDICAL HISTORY :   has a past medical history  of Anxiety; Atrial fibrillation ([place]); Bradycardia; Complication of anesthesia; Difficulty sleeping; Dysrhythmia; H/O hiatal hernia; History of kidney stones; History of skin cancer; History of transfusion; Hypertension; Osteoarthritis; Pacemaker; Pneumonia; PONV (postoperative nausea and vomiting); and Shortness of breath.  has a past surgical history that includes Microdiskectomy (05/2004); Arthroplasty (1994); Arthroplasty (2000); DG SELECTED HSG GDC ONLY (2002); Appendectomy (1960); Total abdominal hysterectomy (1969); Pacemaker insertion; Joint replacement; Cholecystectomy; Back surgery; and Total hip arthroplasty (Right, 12/24/2013).  Current Facility-Administered Medications:  .  acetaminophen (TYLENOL) tablet 650 mg, 650 mg, Oral, Q6H PRN, 650 mg  .  alum & mag hydroxide-simeth (MAALOX/MYLANTA) 200-200-20 MG/5ML suspension 30 mL, 30 mL, Oral, Q4H PRN,  .  amLODipine (NORVASC) tablet 10 mg, 10 mg, Oral, q morning - 10a,  .  azithromycin (ZITHROMAX) tablet 250 mg, 250 mg, Oral, Daily, 06/14/16 1034 .  budesonide (PULMICORT) nebulizer solution 0.25 mg, 0.25 mg, Nebulization, BID .  calcium carbonate (TUMS - dosed in mg elemental calcium) chewable tablet 200 mg of elemental calcium, 1 tablet, Oral, BID PRN, .  cefTRIAXone (ROCEPHIN) 1 g in dextrose 5 % 50 mL IVPB, 1 g, Intravenous, Q24H, 1 g at 06/14/16 1034 .  clonazePAM (KLONOPIN) tablet 0.25 mg, 0.25 mg, Oral, Daily PRN,  .  cloNIDine (CATAPRES) tablet 0.2 mg, 0.2 mg, Oral, BID .  guaiFENesin (MUCINEX) 12 hr tablet 1,200 mg, 1,200 mg, Oral, BID,  06/14/16 1033 .  guaiFENesin-dextromethorphan (ROBITUSSIN DM) 100-10 MG/5ML syrup 5 mL, 5 mL, Oral, Q4H PRN .  hydrALAZINE (APRESOLINE) injection 10 mg, 10 mg, Intravenous, Q4H PRN .  ipratropium (ATROVENT) nebulizer solution 0.5 mg, 0.5 mg, Nebulization, TID, .  irbesartan (AVAPRO) tablet 300 mg, 300 mg, Oral, q morning - 10a,  .  levalbuterol (XOPENEX) nebulizer solution 0.63 mg, 0.63 mg,  Nebulization, Q6H PRN,  .  levalbuterol (XOPENEX) nebulizer solution 0.63 mg, 0.63 mg, Nebulization, TID, Debbe Odea, MD, 0.63 mg at 06/14/16 0906 .  methimazole (TAPAZOLE) tablet 5 mg, 5 mg, Oral, BID, Rise Patience, MD, 5 mg at 06/14/16 1034 .  methocarbamol (ROBAXIN) tablet 500 mg, 500 mg, Oral, Q6H PRN,  .  ondansetron (ZOFRAN) tablet 4 mg, 4 mg, Oral, Q6H PRN **OR** ondansetron (ZOFRAN) injection 4 mg, 4 mg, Intravenous, Q6H PRN, 24 .  pantoprazole (PROTONIX) EC tablet 40 mg, 40 mg, Oral, Daily,  .  potassium chloride SA (K-DUR,KLOR-CON) CR tablet 20 mEq, 20 mEq, Oral, Daily .  pravastatin (PRAVACHOL) tablet 40 mg, 40 mg, Oral, q morning - 10a .  predniSONE (DELTASONE) tablet 30 mg, 30 mg, Oral, Q breakfast, .  senna-docusate (Senokot-S) tablet 2 tablet, 2 tablet, Oral, BID, .  sodium chloride 0.9 % bolus 250 mL, 250 mL, Intravenous, Once, Albertine Patricia, MD, 250 mL at 06/14/16 1257 .  sucralfate (CARAFATE) 1 GM/10ML suspension 1 g, 1 g, Oral, TID WC & HS, Albertine Patricia, MD, 1 g at 06/14/16 1034 .  warfarin (COUMADIN) tablet 2.5 mg, 2.5 mg, Oral, ONCE-1800,    Allergies  Allergen Reactions  . Morphine And Related Nausea And Vomiting    FAMILY HISTORY:  family history includes Heart disease in her mother; Leukemia in her father. SOCIAL HISTORY:  reports that she quit smoking about 42 years ago. She has quit using smokeless tobacco. She reports that she does not drink alcohol or use drugs.  REVIEW OF SYSTEMS:   C/O hearing loss + For congestion, cough, no sputum, dyspnea, wheezing. + GERD No chest pain, palpitation No fevers, chills All other ROS are negative.  SUBJECTIVE:  Feels about the same.   VITAL SIGNS: Temp:  [97.4 F (36.3 C)-98.4 F (36.9 C)] 98.4 F (36.9 C) (03/20 0554) Pulse Rate:  [59-61] 60 (03/20 0554) Resp:  [18] 18 (03/20 0554) BP: (114-150)/(56-72) 145/71 (03/20 1000) SpO2:  [93 %-95 %] 95 % (03/20 0554)  PHYSICAL EXAMINATION: Gen:       No acute distress HEENT:  EOMI, sclera anicteric Neck:     No masses; no thyromegaly Lungs:    Upper airway wheeze, no crackles; normal respiratory effort CV:         Regular rate and rhythm; no murmurs Abd:      + bowel sounds; soft, non-tender; no palpable masses, no distension Ext:    No edema; adequate peripheral perfusion Skin:      Warm and dry; no rash Neuro: alert and oriented x 3 Psych: normal mood and affect  CBC Recent Labs     06/13/16  0543  06/15/16  0533  WBC  11.3*  13.8*  HGB  10.7*  10.2*  HCT  30.6*  29.9*  PLT  264  267    Coag's Recent Labs     06/13/16  0543  06/14/16  0553  06/15/16  0533  INR  3.18  2.76  3.01    BMET Recent Labs     06/13/16  0543  06/14/16  0553  06/15/16  0533  NA  129*  133*  132*  K  3.7  3.8  4.2  CL  95*  99*  102  CO2  28  28  24   BUN  20  18  15   CREATININE  0.93  0.90  0.75  GLUCOSE  93  81  83    Electrolytes Recent Labs     06/13/16  0543  06/14/16  0553  06/15/16  0533  CALCIUM  8.5*  8.5*  8.0*    Sepsis Markers Recent Labs     06/14/16  1624  PROCALCITON  <0.10    ABG No results for input(s): PHART, PCO2ART, PO2ART in the last 72 hours.  Liver Enzymes No results for input(s): AST, ALT, ALKPHOS, BILITOT, ALBUMIN in the last 72 hours.  Cardiac Enzymes No results for input(s): TROPONINI, PROBNP in the last 72 hours.  Glucose No results for input(s): GLUCAP in the last 72 hours.  Imaging Dg Abd Portable 2v  Result Date: 06/14/2016 CLINICAL DATA:  Abdominal pain all over, constipation, history hypertension, atrial fibrillation EXAM: PORTABLE ABDOMEN - 2 VIEW COMPARISON:  Portable exam 1623 hours without priors for comparison FINDINGS: Surgical clips RIGHT upper quadrant likely reflecting cholecystectomy. Multiple pelvic phleboliths. No definite urinary tract calcification. Bowel gas pattern normal. No bowel dilatation bowel wall thickening. Bones demineralized with degenerative disc  disease changes of the lumbar spine and note of a RIGHT hip prosthesis. IMPRESSION: Normal bowel gas pattern. No acute abnormalities. Electronically Signed   By: Lavonia Dana M.D.   On: 06/14/2016 17:19   [place] [facility]  Result Date: 06/15/2016 CLINICAL DATA:  Sinus congestion and cough EXAM: CT PARANASAL SINUS LIMITED WITHOUT CONTRAST TECHNIQUE: Non-contiguous multidetector CT images of the paranasal sinuses were obtained in axial plane without contrast. COMPARISON:  Head CT September 24, 2015 FINDINGS: There is minimal mucosal thickening in several ethmoid air cells bilaterally. Visualized paranasal sinuses elsewhere clear. No air-fluid level. No bony destruction or expansion. Ostiomeatal unit complex region not well seen. There is leftward deviation of the nasal septum. No evident nares obstruction. Visualized orbits appear symmetric bilaterally. Visualized mastoid air cells are clear. Visualized brain parenchyma unremarkable. IMPRESSION: Slight mucosal thickening in several ethmoid air cells. Visualized paranasal sinuses otherwise clear on this limited study. No air-fluid level. No bony destruction or expansion. Leftward deviation nasal septum noted without nares obstruction. Visualized mastoids clear as well. Electronically Signed   By: Lowella Grip III M.D.   On: 06/15/2016 08:37   Personally reviewed. Very minimal GG left LL. Small area on right .   Dg Abd Portable 2v  Result Date: 06/14/2016 CLINICAL DATA:  Abdominal pain all over, constipation, history hypertension, atrial fibrillation EXAM: PORTABLE ABDOMEN - 2 VIEW COMPARISON:  Portable exam 1623 hours without priors for comparison FINDINGS: Surgical clips RIGHT upper quadrant likely reflecting cholecystectomy. Multiple pelvic phleboliths. No definite urinary tract calcification. Bowel gas pattern normal. No bowel dilatation bowel wall thickening. Bones demineralized with degenerative disc disease changes of the lumbar spine and note  of a RIGHT hip prosthesis. IMPRESSION: Normal bowel gas pattern. No acute abnormalities. Electronically Signed   By: Lavonia Dana M.D.   On: 06/14/2016 17:19   [place] [facility]  Result Date: 06/15/2016 CLINICAL DATA:  Sinus congestion and cough EXAM: CT PARANASAL SINUS LIMITED WITHOUT CONTRAST TECHNIQUE: Non-contiguous multidetector CT images of the paranasal sinuses were obtained in axial plane without contrast. COMPARISON:  Head CT September 24, 2015 FINDINGS: There is minimal mucosal thickening in several ethmoid air cells bilaterally. Visualized paranasal sinuses elsewhere clear. No air-fluid level. No bony destruction or expansion. Ostiomeatal unit complex  region not well seen. There is leftward deviation of the nasal septum. No evident nares obstruction. Visualized orbits appear symmetric bilaterally. Visualized mastoid air cells are clear. Visualized brain parenchyma unremarkable. IMPRESSION: Slight mucosal thickening in several ethmoid air cells. Visualized paranasal sinuses otherwise clear on this limited study. No air-fluid level. No bony destruction or expansion. Leftward deviation nasal septum noted without nares obstruction. Visualized mastoids clear as well. Electronically Signed   By: Lowella Grip III M.D.   On: 06/15/2016 08:37    ASSESSMENT AND PLAN Acute Bronchitis Possible CAP Acute sinusitis Laryngopharyngeal reflux disease w/ upper airway wheezing Post-nasal gtt GERD Dizziness/vertigo   Discussion On-going upper airway wheeze in setting of severe upper airway irritation likely d/t acute sinusitis and persistent post-nasal gtt. Seems like the reflux component has been addressed. The presence of vertigo would not be uncommon w/ inner ear involvement as a complication of recent sinusitis. There is no Stridor as the wheezing is only on exhalation.   PFTs reviewed. They show restriction with no obstruction. The flow volume loops is not suggestive either intrathoracic or  extrathoracic obstruction.  Plan/rec - Continue anti reflux therapy - Added flonase, astelin spray, antihistamine for post nasal drip - Continue prednisone and BDs - Tussionex for cough  The patient is critically ill with multiple organ system failure and requires high complexity decision making for assessment and support, frequent evaluation and titration of therapies, advanced monitoring, review of radiographic studies and interpretation of complex data.   Marshell Garfinkel MD [facility] Pager [phone] If no answer or after 3pm call: [phone] 06/15/2016, 1:33 PM

## 2016-06-15 NOTE — Discharge Summary (Signed)
[name], is a 83 y.o. female  DOB 08-27-1925  MRN [id number].  Admission date:  06/07/2016  Admitting Physician  Rise Patience, MD  Discharge Date:  06/15/2016   Primary MD  [name], MD  Recommendations for primary care physician for things to follow:  - Please check CBC, BMP during next visit   Admission Diagnosis  Shortness of breath [R06.02] Bronchitis with bronchospasm [J20.9]   Discharge Diagnosis  Shortness of breath [R06.02] Bronchitis with bronchospasm [J20.9]    Principal Problem:   Acute bronchitis Active Problems:   Essential hypertension   ATRIAL FIBRILLATION   Cardiac pacemaker in situ   Fall      Past Medical History:  Diagnosis Date  . Anxiety   . Atrial fibrillation ([place])   . Bradycardia   . Complication of anesthesia   . Difficulty sleeping   . Dysrhythmia    A FIB  . H/O hiatal hernia   . History of kidney stones   . History of skin cancer   . History of transfusion   . Hypertension   . Osteoarthritis   . Pacemaker   . Pneumonia   . PONV (postoperative nausea and vomiting)   . Shortness of breath    WITH EXERTION    Past Surgical History:  Procedure Laterality Date  . APPENDECTOMY  1960  . ARTHROPLASTY  1994   Left total knee  . ARTHROPLASTY  2000   Total right knee  . BACK SURGERY    . CHOLECYSTECTOMY    . DG SELECTED [facility] ONLY  2002   Dilation  . JOINT REPLACEMENT     knee replacement  . Microdiskectomy  05/2004   Left L4-L5  . PACEMAKER INSERTION    . TOTAL ABDOMINAL HYSTERECTOMY  1969  . TOTAL HIP ARTHROPLASTY Right 12/24/2013   Procedure: RIGHT TOTAL HIP ARTHROPLASTY ANTERIOR APPROACH;  Surgeon: Gearlean Alf, MD;  Location: [facility];  Service: Orthopedics;  Laterality: Right;       History of present illness and  Hospital Course:     Kindly see H&P for history of present illness and admission details, please  review complete Labs, Consult reports and Test reports for all details in brief  HPI  from the history and physical done on the day of admission 06/08/2016 HPI: [name] is a 83 y.o. female with history of atrial fibrillation, symptomatic bradycardia status post pacemaker placement, hypertension and hyperthyroidism presents to the ER with complaints of shortness of breath and wheezing. Patient has been having these symptoms for last 3 days. Has been having some pleuritic-type of chest pain when patient coughs. Otherwise patient is chest pain-free.   ED Course: In the ER patient on exam was mildly hypoxic with bilateral expiratory wheeze. Chest x-ray shows bronchitis changes. Has mild leukocytosis. Patient is placed on nebulizer treatment antibiotic and admitted for acute bronchitis.   Hospital Course  83 y.o.femalewith history of atrial fibrillation, symptomatic bradycardia status post pacemaker placement, hypertension and hyperthyroidismpresents to  the ER with complaints of shortness of breath and wheezing, she was admitted for acute bronchitis, Given significant wheezing she started on steroids as well, CT chest with only small areas of opacity inflammation versus mild infection, as well hyponatremia, most likely due to SIADH, improving with fluid restriction.  Acute bronchitis With wheezing and bronchospasm - no h/o COPD or asthma, Presents with cough, significant wheezing, no evidence of active pulmonary disease and chest x-ray, this is most likely acute bronchitis with bronchospasm,  - Treated with IV Rocephin and azithromycin during hospital stay, no need for further antibodies on discharge . - Pulmonary consulted regarding persistent wheezing, CT chest with no significant findings, wheezing felt to be upper airway wheezing in the setting of severe upper airway irritation likely because of sinusitis/persistent postnasal drip, as well component of reflux, so will be discharged on  Carafate and Protonix for antireflux therapy, we'll discharge on Flonase, Afrin, and prednisone taper, to see next as needed for cough.  Essential hypertension -   will discharge on Norvasc and Avapro, will hold maxes at on discharge secondary to hyponatremia   ATRIAL FIBRILLATION -Cardiac pacemaker in situ for bradycardia - coumadin  - V paced  Hypokalemia/hypomagnesemia - Repleted,  Hyperkalemia  - Resolved,   Hyponatremia -With some volume overload ,received Lasix 3/16, as well mild SIADH, improving on fluid restrictions, continue to hold maxes out on discharge  Hyperthyroid - Continue with Tapazole  History of symptomatic bradycardia status post pacemaker placement.   Discharge Condition:  Stable   Follow UP  Follow-up Information    [name], MD Follow up in 1 week(s).   Specialty:  Internal Medicine Contact information: [place] [address] [phone]        Marshell Garfinkel, MD Follow up.   Specialty:  Pulmonary Disease Why:  Schedule an appointment if needed if continues to have cough Contact information: [address] [place] [address] [phone]             Discharge Instructions  and  Discharge Medications    Discharge Instructions    Discharge instructions    Complete by:  As directed    Follow with Primary MD [name], MD in 7 days   Get CBC, CMP,checked  by Primary MD next visit.    Activity: As tolerated with Full fall precautions use walker/cane & assistance as needed   Disposition Home    Diet: Heart Healthy with fluid restriction 1500 mL per day , with feeding assistance and aspiration precautions.  For Heart failure patients - Check your Weight same time everyday, if you gain over 2 pounds, or you develop in leg swelling, experience more shortness of breath or chest pain, call your Primary MD immediately. Follow Cardiac Low Salt Diet and 1.5 lit/day fluid  restriction.   On your next visit with your primary care physician please Get Medicines reviewed and adjusted.   Please request your Prim.MD to go over all Hospital Tests and Procedure/Radiological results at the follow up, please get all Hospital records sent to your Prim MD by signing hospital release before you go home.   If you experience worsening of your admission symptoms, develop shortness of breath, life threatening emergency, suicidal or homicidal thoughts you must seek medical attention immediately by calling 911 or calling your MD immediately  if symptoms less severe.  You Must read complete instructions/literature along with all the possible adverse reactions/side effects for all the Medicines you take  and that have been prescribed to you. Take any new Medicines after you have completely understood and accpet all the possible adverse reactions/side effects.   Do not drive, operating heavy machinery, perform activities at heights, swimming or participation in water activities or provide baby sitting services if your were admitted for syncope or siezures until you have seen by Primary MD or a Neurologist and advised to do so again.  Do not drive when taking Pain medications.    Do not take more than prescribed Pain, Sleep and Anxiety Medications  Special Instructions: If you have smoked or chewed Tobacco  in the last 2 yrs please stop smoking, stop any regular Alcohol  and or any Recreational drug use.  Wear Seat belts while driving.   Please note  You were cared for by a hospitalist during your hospital stay. If you have any questions about your discharge medications or the care you received while you were in the hospital after you are discharged, you can call the unit and asked to speak with the hospitalist on call if the hospitalist that took care of you is not available. Once you are discharged, your primary care physician will handle any further medical issues. Please note  that NO REFILLS for any discharge medications will be authorized once you are discharged, as it is imperative that you return to your primary care physician (or establish a relationship with a primary care physician if you do not have one) for your aftercare needs so that they can reassess your need for medications and monitor your lab values.   Increase activity slowly    Complete by:  As directed      Allergies as of 06/15/2016      Reactions   Morphine And Related Nausea And Vomiting      Medication List    STOP taking these medications   triamterene-hydrochlorothiazide 37.5-25 MG tablet Commonly known as:  MAXZIDE-25     TAKE these medications   albuterol (2.5 MG/3ML) 0.083% nebulizer solution Commonly known as:  PROVENTIL Take 3 mLs (2.5 mg total) by nebulization every 4 (four) hours as needed for wheezing or shortness of breath.   amLODipine 10 MG tablet Commonly known as:  NORVASC Take 10 mg by mouth every morning.   clonazePAM 0.5 MG tablet Commonly known as:  KLONOPIN Take 0.5 mg by mouth daily as needed for anxiety.   cloNIDine 0.1 MG tablet Commonly known as:  CATAPRES Take 0.1 mg by mouth 2 (two) times daily.   fluticasone 50 MCG/ACT nasal spray Commonly known as:  FLONASE Place 2 sprays into both nostrils daily.   guaiFENesin-dextromethorphan 100-10 MG/5ML syrup Commonly known as:  ROBITUSSIN DM Take 5 mLs by mouth every 4 (four) hours as needed for cough.   irbesartan 300 MG tablet Commonly known as:  AVAPRO Take 300 mg by mouth every morning.   methimazole 5 MG tablet Commonly known as:  TAPAZOLE TAKE ONE TABLET TWICE DAILY WITH FOOD   oxymetazoline 0.05 % nasal spray Commonly known as:  AFRIN Place 1 spray into both nostrils 2 (two) times daily. Please use for 5 days then stop   pantoprazole 40 MG tablet Commonly known as:  PROTONIX Take 1 tablet (40 mg total) by mouth daily. Start taking on:  06/16/2016   potassium chloride SA 20 MEQ  tablet Commonly known as:  K-DUR,KLOR-CON Take 20 mEq by mouth every morning.   pravastatin 40 MG tablet Commonly known as:  PRAVACHOL Take 40 mg by  mouth every morning.   predniSONE 10 MG tablet Commonly known as:  DELTASONE Please take 30 mg for 2 days, then 20 mg for 3 days, then 10 mg for 3 days, then stop.   PREVIDENT 5000 PLUS 1.1 % Crea dental cream Generic drug:  sodium fluoride Take 1 Dose by mouth at bedtime.   sodium chloride 0.65 % Soln nasal spray Commonly known as:  OCEAN Place 1 spray into the nose 4 (four) times daily.   sucralfate 1 g tablet Commonly known as:  CARAFATE Take 1 tablet (1 g total) by mouth 4 (four) times daily -  with meals and at bedtime.   warfarin 2 MG tablet Commonly known as:  COUMADIN Take 1-2 tablets (2-4 mg total) by mouth as directed. Take Coumadin for three weeks for postoperative protocol and then the patient may resume their previous Coumadin home regimen.  The dose may need to be adjusted based upon the INR.  Please follow the INR and titrate Coumadin dose for a therapeutic range between 2.0 and 3.0 INR.  After completing the three weeks of Coumadin, the patient may resume their previous Coumadin home regimen. What changed:  when to take this  additional instructions            Durable Medical Equipment        Start     Ordered   06/15/16 1506  For home use only DME Nebulizer machine  Once    Question:  Patient needs a nebulizer to treat with the following condition  Answer:  Wheezing   06/15/16 1505        Diet and Activity recommendation: See Discharge Instructions above   Consults obtained -  PCCM   Major procedures and Radiology Reports - PLEASE review detailed and final reports for all details, in brief -      Dg Chest 2 View  Result Date: 06/11/2016 CLINICAL DATA:  Shortness of breath. EXAM: CHEST  2 VIEW COMPARISON:  06/07/2016 . FINDINGS: Cardiac pacer with lead tip over the right ventricle.  Cardiomegaly with normal pulmonary vascularity. Low lung volumes with mild basilar atelectasis. Small left pleural effusion. No pneumothorax. IMPRESSION: 1. Low lung volumes with mild basilar atelectasis. Small left pleural effusion. 2. Cardiac pacer noted with lead tip over the right ventricle. Electronically Signed   By: Marcello Moores  Register   On: 06/11/2016 10:43   Dg Chest 2 View  Result Date: 06/07/2016 CLINICAL DATA:  Wheezing, SOB, congestion. Worse today. Pacemaker placed 2007. Non smoker EXAM: CHEST  2 VIEW COMPARISON:  05/13/2015 FINDINGS: Cardiac pacemaker. Linear fibrosis in the left lung base is unchanged since prior study. Slight fibrosis also demonstrated in the right lung base, unchanged. Peribronchial thickening centrally suggesting chronic bronchitis. No focal airspace disease or consolidation. Normal heart size and pulmonary vascularity. Calcified and tortuous aorta. Degenerative changes in the spine. IMPRESSION: Chronic bronchitic changes in chronic scarring in the lung bases. No evidence of active pulmonary disease. Electronically Signed   By: Lucienne Capers M.D.   On: 06/07/2016 22:02   Ct Chest Wo Contrast  Result Date: 06/12/2016 CLINICAL DATA:  83 year old female with shortness of breath, chest pain and productive cough EXAM: CT CHEST WITHOUT CONTRAST TECHNIQUE: Multidetector CT imaging of the chest was performed following the standard protocol without IV contrast. COMPARISON:  Chest x-ray 06/11/2016 FINDINGS: Cardiovascular: Limited evaluation in the absence of intravenous contrast. Conventional 3 vessel arch anatomy. Scattered atherosclerotic calcifications. No aneurysm. Calcifications are also present throughout the coronary arteries.  The heart is enlarged, particularly both the left and right atria. Intracardiac defibrillator present with the tip in the right ventricular apex. No pericardial effusion. Mediastinum/Nodes: Heterogeneous Lee enlarged and globular thyroid gland most  consistent with thyroid goiter. A large goitrous nodule extends into the upper mediastinum. No additional mediastinal mass or suspicious adenopathy. Small hiatal hernia. Lungs/Pleura: Trace subsegmental atelectasis in the left lower lobe with elevation of the left hemidiaphragm. There are a few small isolated patches of ground-glass attenuation airspace opacity in both upper lungs (right upper lobe on 33 and 36 of series 5 and left upper lobe on image 64 of series 5). Tiny calcified subpleural granuloma in the periphery of the right lower lobe. No follow-up imaging required. No pleural effusion or pulmonary edema. Upper Abdomen: The gallbladder is surgically absent. No intra or extrahepatic biliary ductal dilatation. A circumscribed 1.9 cm low-attenuation lesion in the left hepatic lobe is incompletely characterized in the absence of intravenous contrast but statistically highly likely a benign cyst. No acute abnormalities identified. Musculoskeletal: No acute fracture or aggressive appearing lytic or blastic osseous lesion. IMPRESSION: 1. There are just a few small patchy foci of ground-glass attenuation airspace opacity in the right and left upper lobes. These findings are nonspecific but most suggestive of an acute infectious or inflammatory process such as early multifocal pneumonia or an atypical/viral respiratory infection. It should be emphasized that the imaging findings are subtle and involve an extreme minority of the overall lung parenchyma. 2. Coronary artery calcifications. 3.  Aortic Atherosclerosis (ICD10-170.0). 4. Large thyroid goiter with extension into the superior mediastinum. 5. Well-positioned intracardiac defibrillator lead. Electronically Signed   By: Jacqulynn Cadet M.D.   On: 06/12/2016 11:10   Dg Abd Portable 2v  Result Date: 06/14/2016 CLINICAL DATA:  Abdominal pain all over, constipation, history hypertension, atrial fibrillation EXAM: PORTABLE ABDOMEN - 2 VIEW COMPARISON:   Portable exam 1623 hours without priors for comparison FINDINGS: Surgical clips RIGHT upper quadrant likely reflecting cholecystectomy. Multiple pelvic phleboliths. No definite urinary tract calcification. Bowel gas pattern normal. No bowel dilatation bowel wall thickening. Bones demineralized with degenerative disc disease changes of the lumbar spine and note of a RIGHT hip prosthesis. IMPRESSION: Normal bowel gas pattern. No acute abnormalities. Electronically Signed   By: Lavonia Dana M.D.   On: 06/14/2016 17:19   [place] Cm  Result Date: 06/15/2016 CLINICAL DATA:  Sinus congestion and cough EXAM: CT PARANASAL SINUS LIMITED WITHOUT CONTRAST TECHNIQUE: Non-contiguous multidetector CT images of the paranasal sinuses were obtained in axial plane without contrast. COMPARISON:  Head CT September 24, 2015 FINDINGS: There is minimal mucosal thickening in several ethmoid air cells bilaterally. Visualized paranasal sinuses elsewhere clear. No air-fluid level. No bony destruction or expansion. Ostiomeatal unit complex region not well seen. There is leftward deviation of the nasal septum. No evident nares obstruction. Visualized orbits appear symmetric bilaterally. Visualized mastoid air cells are clear. Visualized brain parenchyma unremarkable. IMPRESSION: Slight mucosal thickening in several ethmoid air cells. Visualized paranasal sinuses otherwise clear on this limited study. No air-fluid level. No bony destruction or expansion. Leftward deviation nasal septum noted without nares obstruction. Visualized mastoids clear as well. Electronically Signed   By: Lowella Grip III M.D.   On: 06/15/2016 08:37    Micro Results    No results found for this or any previous visit (from the past 240 hour(s)).     Today   Subjective:   Abbeygail Igoe today has no headache,no chest or  abdominal pain, report is feeling much better today, no further vertigo, still complains with cough especially at night  and an inherently a.m. . Objective:   Blood pressure (!) 134/57, pulse 60, temperature 97.6 F (36.4 C), temperature source Oral, resp. rate 18, height 5\' 4"  (1.626 m), weight 70.2 kg (154 lb 12.8 oz), SpO2 98 %.   Intake/Output Summary (Last 24 hours) at 06/15/16 1511 Last data filed at 06/15/16 1413  Gross per 24 hour  Intake            787.5 ml  Output                0 ml  Net            787.5 ml    Exam Awake Alert, Oriented X 3, No new F.N deficits, Normal affect Symmetrical Chest wall movement,fair air movement bilaterally, some mild wheezing, RRR,No Gallops,Rubs or new Murmurs, No Parasternal Heave +ve B.Sounds, Abd Soft, No tenderness,  No rebound - guarding or rigidity. No Cyanosis, Clubbing , No new Rash or bruise , no edema   Data Review   CBC w Diff:  Lab Results  Component Value Date   WBC 13.8 (H) 06/15/2016   HGB 10.2 (L) 06/15/2016   HCT 29.9 (L) 06/15/2016   PLT 267 06/15/2016   LYMPHOPCT 3 06/08/2016   MONOPCT 1 06/08/2016   EOSPCT 0 06/08/2016   BASOPCT 0 06/08/2016    CMP:  Lab Results  Component Value Date   NA 132 (L) 06/15/2016   K 4.2 06/15/2016   CL 102 06/15/2016   CO2 24 06/15/2016   BUN 15 06/15/2016   CREATININE 0.75 06/15/2016   PROT 6.7 06/08/2016   ALBUMIN 3.5 06/08/2016   BILITOT 0.7 06/08/2016   ALKPHOS 77 06/08/2016   AST 31 06/08/2016   ALT 14 06/08/2016  .   Total Time in preparing paper work, data evaluation and todays exam - 35 minutes  Clydene Burack M.D on 06/15/2016 at 3:11 PM  [facility]   Office  [phone]

## 2016-06-17 MED ORDER — ALBUTEROL SULFATE (2.5 MG/3ML) 0.083% IN NEBU: 2.5000 mg | INHALATION_SOLUTION | RESPIRATORY_TRACT | 1 refills | Status: DC | PRN

## 2016-06-18 DIAGNOSIS — F419 Anxiety disorder, unspecified: Secondary | ICD-10-CM | POA: Diagnosis not present

## 2016-06-18 DIAGNOSIS — J209 Acute bronchitis, unspecified: Secondary | ICD-10-CM | POA: Diagnosis not present

## 2016-06-18 DIAGNOSIS — I482 Chronic atrial fibrillation: Secondary | ICD-10-CM | POA: Diagnosis not present

## 2016-06-18 DIAGNOSIS — I10 Essential (primary) hypertension: Secondary | ICD-10-CM | POA: Diagnosis not present

## 2016-06-18 DIAGNOSIS — J208 Acute bronchitis due to other specified organisms: Secondary | ICD-10-CM | POA: Diagnosis not present

## 2016-06-18 DIAGNOSIS — M1990 Unspecified osteoarthritis, unspecified site: Secondary | ICD-10-CM | POA: Diagnosis not present

## 2016-06-18 DIAGNOSIS — Z950 Presence of cardiac pacemaker: Secondary | ICD-10-CM | POA: Diagnosis not present

## 2016-06-18 DIAGNOSIS — Z87891 Personal history of nicotine dependence: Secondary | ICD-10-CM | POA: Diagnosis not present

## 2016-06-21 DIAGNOSIS — Z950 Presence of cardiac pacemaker: Secondary | ICD-10-CM | POA: Diagnosis not present

## 2016-06-21 DIAGNOSIS — F419 Anxiety disorder, unspecified: Secondary | ICD-10-CM | POA: Diagnosis not present

## 2016-06-21 DIAGNOSIS — M1990 Unspecified osteoarthritis, unspecified site: Secondary | ICD-10-CM | POA: Diagnosis not present

## 2016-06-21 DIAGNOSIS — I482 Chronic atrial fibrillation: Secondary | ICD-10-CM | POA: Diagnosis not present

## 2016-06-21 DIAGNOSIS — J209 Acute bronchitis, unspecified: Secondary | ICD-10-CM | POA: Diagnosis not present

## 2016-06-21 DIAGNOSIS — I10 Essential (primary) hypertension: Secondary | ICD-10-CM | POA: Diagnosis not present

## 2016-06-22 ENCOUNTER — Encounter: Payer: Self-pay | Admitting: Internal Medicine

## 2016-06-22 DIAGNOSIS — R001 Bradycardia, unspecified: Secondary | ICD-10-CM | POA: Diagnosis not present

## 2016-06-22 DIAGNOSIS — I495 Sick sinus syndrome: Secondary | ICD-10-CM | POA: Diagnosis not present

## 2016-06-24 ENCOUNTER — Telehealth: Payer: Self-pay | Admitting: Internal Medicine

## 2016-06-24 DIAGNOSIS — Z950 Presence of cardiac pacemaker: Secondary | ICD-10-CM | POA: Diagnosis not present

## 2016-06-24 DIAGNOSIS — I482 Chronic atrial fibrillation: Secondary | ICD-10-CM | POA: Diagnosis not present

## 2016-06-24 DIAGNOSIS — I10 Essential (primary) hypertension: Secondary | ICD-10-CM | POA: Diagnosis not present

## 2016-06-24 DIAGNOSIS — J209 Acute bronchitis, unspecified: Secondary | ICD-10-CM | POA: Diagnosis not present

## 2016-06-24 DIAGNOSIS — M1990 Unspecified osteoarthritis, unspecified site: Secondary | ICD-10-CM | POA: Diagnosis not present

## 2016-06-24 DIAGNOSIS — F419 Anxiety disorder, unspecified: Secondary | ICD-10-CM | POA: Diagnosis not present

## 2016-06-24 NOTE — Telephone Encounter (Signed)
[name] states that the pts HR has been irregular. She reports that her HR speeds up and slows down but that there are no skipped beats noted, only a change in rate.  She is aware that the pt has an appt with Dr Lovena Le on 4/3 and just wants him to be aware of the pts irr HR.  Will forward message to Dr Lovena Le as Juluis Rainier.

## 2016-06-24 NOTE — Telephone Encounter (Signed)
Raoul Pitch, RN with [facility] is calling to report that patient has an irregular heartbeat. Baker Janus just wanted to make Dr. Lovena Le aware . Please call , thanks.

## 2016-06-25 DIAGNOSIS — J209 Acute bronchitis, unspecified: Secondary | ICD-10-CM | POA: Diagnosis not present

## 2016-06-25 DIAGNOSIS — I10 Essential (primary) hypertension: Secondary | ICD-10-CM | POA: Diagnosis not present

## 2016-06-25 DIAGNOSIS — I482 Chronic atrial fibrillation: Secondary | ICD-10-CM | POA: Diagnosis not present

## 2016-06-25 DIAGNOSIS — Z950 Presence of cardiac pacemaker: Secondary | ICD-10-CM | POA: Diagnosis not present

## 2016-06-25 DIAGNOSIS — M1990 Unspecified osteoarthritis, unspecified site: Secondary | ICD-10-CM | POA: Diagnosis not present

## 2016-06-25 DIAGNOSIS — F419 Anxiety disorder, unspecified: Secondary | ICD-10-CM | POA: Diagnosis not present

## 2016-06-28 DIAGNOSIS — F419 Anxiety disorder, unspecified: Secondary | ICD-10-CM | POA: Diagnosis not present

## 2016-06-28 DIAGNOSIS — I10 Essential (primary) hypertension: Secondary | ICD-10-CM | POA: Diagnosis not present

## 2016-06-28 DIAGNOSIS — I482 Chronic atrial fibrillation: Secondary | ICD-10-CM | POA: Diagnosis not present

## 2016-06-28 DIAGNOSIS — J209 Acute bronchitis, unspecified: Secondary | ICD-10-CM | POA: Diagnosis not present

## 2016-06-28 DIAGNOSIS — M1990 Unspecified osteoarthritis, unspecified site: Secondary | ICD-10-CM | POA: Diagnosis not present

## 2016-06-28 DIAGNOSIS — Z950 Presence of cardiac pacemaker: Secondary | ICD-10-CM | POA: Diagnosis not present

## 2016-06-29 ENCOUNTER — Encounter (INDEPENDENT_AMBULATORY_CARE_PROVIDER_SITE_OTHER): Payer: Self-pay

## 2016-06-29 ENCOUNTER — Ambulatory Visit (INDEPENDENT_AMBULATORY_CARE_PROVIDER_SITE_OTHER): Payer: Medicare Other | Admitting: Internal Medicine

## 2016-06-29 ENCOUNTER — Encounter: Payer: Self-pay | Admitting: Internal Medicine

## 2016-06-29 VITALS — BP 120/58 | HR 72 | Ht 64.0 in | Wt 157.8 lb

## 2016-06-29 DIAGNOSIS — J209 Acute bronchitis, unspecified: Secondary | ICD-10-CM | POA: Diagnosis not present

## 2016-06-29 DIAGNOSIS — I10 Essential (primary) hypertension: Secondary | ICD-10-CM | POA: Diagnosis not present

## 2016-06-29 DIAGNOSIS — M1990 Unspecified osteoarthritis, unspecified site: Secondary | ICD-10-CM | POA: Diagnosis not present

## 2016-06-29 DIAGNOSIS — I4891 Unspecified atrial fibrillation: Secondary | ICD-10-CM | POA: Diagnosis not present

## 2016-06-29 DIAGNOSIS — Z950 Presence of cardiac pacemaker: Secondary | ICD-10-CM

## 2016-06-29 DIAGNOSIS — I482 Chronic atrial fibrillation: Secondary | ICD-10-CM | POA: Diagnosis not present

## 2016-06-29 DIAGNOSIS — F419 Anxiety disorder, unspecified: Secondary | ICD-10-CM | POA: Diagnosis not present

## 2016-06-29 LAB — CUP PACEART INCLINIC DEVICE CHECK
Battery Impedance: 1800 Ohm
Battery Voltage: 2.78 V
Implantable Lead Implant Date: 20070501
Implantable Pulse Generator Implant Date: 20070501
Lead Channel Impedance Value: 351 Ohm
Lead Channel Setting Pacing Pulse Width: 0.4 ms
Lead Channel Setting Sensing Sensitivity: 2 mV
MDC IDC LEAD LOCATION: 753860
MDC IDC PG SERIAL: 1664242
MDC IDC SESS DTM: 20180403113616
Pulse Gen Model: 5156

## 2016-06-29 NOTE — Patient Instructions (Signed)
Medication Instructions:  Your physician recommends that you continue on your current medications as directed. Please refer to the Current Medication list given to you today.   Labwork: None Ordered  Testing/Procedures: None Ordeed  Follow-Up: Your physician recommends that you schedule a follow-up appointment in 6 MONTHS with [facility]. Marland Kitchen  Your physician wants you to follow-up in 1 YEAR with Dr. Lovena Le.  You will receive a reminder letter in the mail two months in advance. If you don't receive a letter, please call our office to schedule the follow-up appointment.   Any Other Special Instructions Will Be Listed Below (If Applicable).     If you need a refill on your cardiac medications before your next appointment, please call your pharmacy.

## 2016-06-29 NOTE — Progress Notes (Signed)
HPI [name] returns today for followup. She is a pleasant 83 yo woman with a h/o symptomatic bradycardia, atrial fibrillation, status post permanent pacemaker insertion. She has hypertension. In the last year, she has undergone PM gen change. She has developed bronchitis and has residual pain on her left side related to coughing.  Allergies  Allergen Reactions  . Morphine And Related Nausea And Vomiting     Current Outpatient Prescriptions  Medication Sig Dispense Refill  . acetaminophen (TYLENOL) 325 MG tablet Take 650 mg by mouth every 6 (six) hours as needed (pain).    Marland Kitchen amLODipine (NORVASC) 10 MG tablet Take 10 mg by mouth every morning.     . clonazePAM (KLONOPIN) 0.5 MG tablet Take 0.5 mg by mouth daily as needed for anxiety.     . cloNIDine (CATAPRES) 0.1 MG tablet Take 0.1 mg by mouth 2 (two) times daily.     Marland Kitchen guaiFENesin-dextromethorphan (ROBITUSSIN DM) 100-10 MG/5ML syrup Take 5 mLs by mouth every 4 (four) hours as needed for cough. 118 mL 0  . irbesartan (AVAPRO) 300 MG tablet Take 300 mg by mouth every morning.    . methimazole (TAPAZOLE) 5 MG tablet TAKE ONE TABLET BY MOUTH TWICE DAILY WITH FOOD  0  . pantoprazole (PROTONIX) 40 MG tablet Take 1 tablet (40 mg total) by mouth daily. 30 tablet 1  . potassium chloride SA (K-DUR,KLOR-CON) 20 MEQ tablet Take 20 mEq by mouth every morning.     . pravastatin (PRAVACHOL) 40 MG tablet Take 40 mg by mouth every morning.     Marland Kitchen PREVIDENT 5000 PLUS 1.1 % CREA dental cream Take 1 Dose by mouth at bedtime. As directed    . sodium chloride (OCEAN) 0.65 % SOLN nasal spray Place 1 spray into the nose 4 (four) times daily. 60 mL 0  . sucralfate (CARAFATE) 1 g tablet Take 1 tablet (1 g total) by mouth 4 (four) times daily -  with meals and at bedtime. 120 tablet 0  . warfarin (COUMADIN) 2 MG tablet Take 1-2 tablets (2-4 mg total) by mouth as directed. Take Coumadin for three weeks for postoperative protocol and then the patient may resume their  previous Coumadin home regimen.  The dose may need to be adjusted based upon the INR.  Please follow the INR and titrate Coumadin dose for a therapeutic range between 2.0 and 3.0 INR.  After completing the three weeks of Coumadin, the patient may resume their previous Coumadin home regimen. (Patient taking differently: Take 2-4 mg by mouth daily. Take 2 tablets on Monday and then take 1 tablet all the other days) 30 tablet 0   No current facility-administered medications for this visit.      Past Medical History:  Diagnosis Date  . Anxiety   . Atrial fibrillation ([place])   . Bradycardia   . Complication of anesthesia   . Difficulty sleeping   . Dysrhythmia    A FIB  . H/O hiatal hernia   . History of kidney stones   . History of skin cancer   . History of transfusion   . Hypertension   . Osteoarthritis   . Pacemaker   . Pneumonia   . PONV (postoperative nausea and vomiting)   . Shortness of breath    WITH EXERTION    ROS:   All systems reviewed and negative except as noted in the HPI.   Past Surgical History:  Procedure Laterality Date  . APPENDECTOMY  1960  . ARTHROPLASTY  1994   Left total knee  . ARTHROPLASTY  2000   Total right knee  . BACK SURGERY    . CHOLECYSTECTOMY    . DG SELECTED [facility] ONLY  2002   Dilation  . JOINT REPLACEMENT     knee replacement  . Microdiskectomy  05/2004   Left L4-L5  . PACEMAKER INSERTION    . TOTAL ABDOMINAL HYSTERECTOMY  1969  . TOTAL HIP ARTHROPLASTY Right 12/24/2013   Procedure: RIGHT TOTAL HIP ARTHROPLASTY ANTERIOR APPROACH;  Surgeon: Gearlean Alf, MD;  Location: [facility];  Service: Orthopedics;  Laterality: Right;     Family History  Problem Relation Age of Onset  . Heart disease Mother   . Leukemia Father      Social History   Social History  . Marital status: Married    Spouse name: N/A  . Number of children: N/A  . Years of education: N/A   Occupational History  . Not on file.   Social History Main  Topics  . Smoking status: Former Smoker    Quit date: 12/18/1973  . Smokeless tobacco: Former Systems developer     Comment: Smoker for 25 years and quit at around age 39  . Alcohol use No  . Drug use: No  . Sexual activity: Not on file   Other Topics Concern  . Not on file   Social History Narrative   Married, husband's name is Warner Mccreedy, they have been married since 32   2 sons and 4 grandchildren           BP (!) 120/58   Pulse 72   Ht 5\' 4"  (1.626 m)   Wt 157 lb 12.8 oz (71.6 kg)   SpO2 98%   BMI 27.09 kg/m   Physical Exam:  Well appearing 83 year old woman,NAD HEENT: Unremarkable Neck:  5 cm JVD, no thyromegally Back - left sided tenderness Lungs:  Clear with no wheezes HEART:  Regular rate rhythm, no murmurs, no rubs, no clicks Abd:  soft, positive bowel sounds, no organomegally, no rebound, no guarding Ext:  2 plus pulses, no edema, no cyanosis, no clubbing Skin:  No rashes no nodules Neuro:  CN II through XII intact, motor grossly intact   DEVICE  Normal device function.  See PaceArt for details.   Assess/Plan: 1. PPM - her [employer] device is working normally. Will recheck in several months. 2. Chest pain - her symptoms are related to coughing and should improve when her brochitis improves. 3. HTN - her blood pressure is controlled today but has been elevated at times, requiring more clonidine. She is encouraged to maintain a low sodium diet. 4. Atrial fib - her ventricular rate is controlled. She is pacing 80% of the time. She will continue warfarin.  Mikle Bosworth.D.

## 2016-06-30 ENCOUNTER — Telehealth: Payer: Self-pay | Admitting: Internal Medicine

## 2016-06-30 DIAGNOSIS — I482 Chronic atrial fibrillation: Secondary | ICD-10-CM | POA: Diagnosis not present

## 2016-06-30 DIAGNOSIS — I10 Essential (primary) hypertension: Secondary | ICD-10-CM | POA: Diagnosis not present

## 2016-06-30 DIAGNOSIS — F419 Anxiety disorder, unspecified: Secondary | ICD-10-CM | POA: Diagnosis not present

## 2016-06-30 DIAGNOSIS — Z950 Presence of cardiac pacemaker: Secondary | ICD-10-CM | POA: Diagnosis not present

## 2016-06-30 DIAGNOSIS — J209 Acute bronchitis, unspecified: Secondary | ICD-10-CM | POA: Diagnosis not present

## 2016-06-30 DIAGNOSIS — M1990 Unspecified osteoarthritis, unspecified site: Secondary | ICD-10-CM | POA: Diagnosis not present

## 2016-06-30 NOTE — Telephone Encounter (Signed)
New message    Pt c/o swelling: STAT is pt has developed SOB within 24 hours  1. How long have you been experiencing swelling? 1 week  2. Where is the swelling located? Legs and ankles  3.  Are you currently taking a "fluid pill"? yes 4.  Are you currently SOB? no  5.  Have you traveled recently?no   Pt calling stating that a medication she used to be taking is no longer on her list of medications and wants to know why. She cannot remember the name but thinks this is causing the swelling. Requests a call back.

## 2016-06-30 NOTE — Telephone Encounter (Signed)
Pt is calling because she was taking Triamterene/HCTZ 37.5/25 mg prior to being admitting recently but this was stopped and she wants to know why.  She c/o feeling "alittle puffy"'  her BP was elevated today 128/82 and she wonders if she should restart it.  She reports all other medications are the same on her medication list.  Advised I will forward this information to Dr Lovena Le for review and recommendation.  Pt states understanding.

## 2016-07-02 DIAGNOSIS — J209 Acute bronchitis, unspecified: Secondary | ICD-10-CM | POA: Diagnosis not present

## 2016-07-02 DIAGNOSIS — Z950 Presence of cardiac pacemaker: Secondary | ICD-10-CM | POA: Diagnosis not present

## 2016-07-02 DIAGNOSIS — F419 Anxiety disorder, unspecified: Secondary | ICD-10-CM | POA: Diagnosis not present

## 2016-07-02 DIAGNOSIS — I10 Essential (primary) hypertension: Secondary | ICD-10-CM | POA: Diagnosis not present

## 2016-07-02 DIAGNOSIS — M1990 Unspecified osteoarthritis, unspecified site: Secondary | ICD-10-CM | POA: Diagnosis not present

## 2016-07-02 DIAGNOSIS — I482 Chronic atrial fibrillation: Secondary | ICD-10-CM | POA: Diagnosis not present

## 2016-07-05 MED ORDER — TRIAMTERENE-HCTZ 37.5-25 MG PO CAPS: 1.0000 | ORAL_CAPSULE | Freq: Every day | ORAL | 3 refills | Status: DC

## 2016-07-05 NOTE — Telephone Encounter (Signed)
Ok to start this back. GT

## 2016-07-05 NOTE — Telephone Encounter (Signed)
Pt aware and will restart as ordered.  She will c/b when she needs a refill.

## 2016-07-06 DIAGNOSIS — I482 Chronic atrial fibrillation: Secondary | ICD-10-CM | POA: Diagnosis not present

## 2016-07-06 DIAGNOSIS — Z950 Presence of cardiac pacemaker: Secondary | ICD-10-CM | POA: Diagnosis not present

## 2016-07-06 DIAGNOSIS — I10 Essential (primary) hypertension: Secondary | ICD-10-CM | POA: Diagnosis not present

## 2016-07-06 DIAGNOSIS — J209 Acute bronchitis, unspecified: Secondary | ICD-10-CM | POA: Diagnosis not present

## 2016-07-06 DIAGNOSIS — F419 Anxiety disorder, unspecified: Secondary | ICD-10-CM | POA: Diagnosis not present

## 2016-07-06 DIAGNOSIS — M1990 Unspecified osteoarthritis, unspecified site: Secondary | ICD-10-CM | POA: Diagnosis not present

## 2016-07-07 DIAGNOSIS — I10 Essential (primary) hypertension: Secondary | ICD-10-CM | POA: Diagnosis not present

## 2016-07-07 DIAGNOSIS — F419 Anxiety disorder, unspecified: Secondary | ICD-10-CM | POA: Diagnosis not present

## 2016-07-07 DIAGNOSIS — J209 Acute bronchitis, unspecified: Secondary | ICD-10-CM | POA: Diagnosis not present

## 2016-07-07 DIAGNOSIS — Z950 Presence of cardiac pacemaker: Secondary | ICD-10-CM | POA: Diagnosis not present

## 2016-07-07 DIAGNOSIS — M1990 Unspecified osteoarthritis, unspecified site: Secondary | ICD-10-CM | POA: Diagnosis not present

## 2016-07-07 DIAGNOSIS — I482 Chronic atrial fibrillation: Secondary | ICD-10-CM | POA: Diagnosis not present

## 2016-07-08 DIAGNOSIS — F419 Anxiety disorder, unspecified: Secondary | ICD-10-CM | POA: Diagnosis not present

## 2016-07-08 DIAGNOSIS — J209 Acute bronchitis, unspecified: Secondary | ICD-10-CM | POA: Diagnosis not present

## 2016-07-08 DIAGNOSIS — M1990 Unspecified osteoarthritis, unspecified site: Secondary | ICD-10-CM | POA: Diagnosis not present

## 2016-07-08 DIAGNOSIS — Z950 Presence of cardiac pacemaker: Secondary | ICD-10-CM | POA: Diagnosis not present

## 2016-07-08 DIAGNOSIS — I482 Chronic atrial fibrillation: Secondary | ICD-10-CM | POA: Diagnosis not present

## 2016-07-08 DIAGNOSIS — I10 Essential (primary) hypertension: Secondary | ICD-10-CM | POA: Diagnosis not present

## 2016-07-14 DIAGNOSIS — R0609 Other forms of dyspnea: Secondary | ICD-10-CM | POA: Diagnosis not present

## 2016-07-14 DIAGNOSIS — J209 Acute bronchitis, unspecified: Secondary | ICD-10-CM | POA: Diagnosis not present

## 2016-07-14 DIAGNOSIS — I482 Chronic atrial fibrillation: Secondary | ICD-10-CM | POA: Diagnosis not present

## 2016-07-14 DIAGNOSIS — I10 Essential (primary) hypertension: Secondary | ICD-10-CM | POA: Diagnosis not present

## 2016-07-14 DIAGNOSIS — Z7901 Long term (current) use of anticoagulants: Secondary | ICD-10-CM | POA: Diagnosis not present

## 2016-07-14 DIAGNOSIS — Z6827 Body mass index (BMI) 27.0-27.9, adult: Secondary | ICD-10-CM | POA: Diagnosis not present

## 2016-07-15 DIAGNOSIS — L720 Epidermal cyst: Secondary | ICD-10-CM | POA: Diagnosis not present

## 2016-07-15 DIAGNOSIS — L821 Other seborrheic keratosis: Secondary | ICD-10-CM | POA: Diagnosis not present

## 2016-07-15 DIAGNOSIS — Z85828 Personal history of other malignant neoplasm of skin: Secondary | ICD-10-CM | POA: Diagnosis not present

## 2016-07-15 DIAGNOSIS — I482 Chronic atrial fibrillation: Secondary | ICD-10-CM | POA: Diagnosis not present

## 2016-07-15 DIAGNOSIS — M1990 Unspecified osteoarthritis, unspecified site: Secondary | ICD-10-CM | POA: Diagnosis not present

## 2016-07-15 DIAGNOSIS — L905 Scar conditions and fibrosis of skin: Secondary | ICD-10-CM | POA: Diagnosis not present

## 2016-07-15 DIAGNOSIS — I10 Essential (primary) hypertension: Secondary | ICD-10-CM | POA: Diagnosis not present

## 2016-07-15 DIAGNOSIS — J209 Acute bronchitis, unspecified: Secondary | ICD-10-CM | POA: Diagnosis not present

## 2016-07-15 DIAGNOSIS — Z950 Presence of cardiac pacemaker: Secondary | ICD-10-CM | POA: Diagnosis not present

## 2016-07-15 DIAGNOSIS — F419 Anxiety disorder, unspecified: Secondary | ICD-10-CM | POA: Diagnosis not present

## 2016-07-20 DIAGNOSIS — G459 Transient cerebral ischemic attack, unspecified: Secondary | ICD-10-CM | POA: Diagnosis not present

## 2016-07-20 DIAGNOSIS — Z7901 Long term (current) use of anticoagulants: Secondary | ICD-10-CM | POA: Diagnosis not present

## 2016-07-20 DIAGNOSIS — I482 Chronic atrial fibrillation: Secondary | ICD-10-CM | POA: Diagnosis not present

## 2016-07-21 DIAGNOSIS — I482 Chronic atrial fibrillation: Secondary | ICD-10-CM | POA: Diagnosis not present

## 2016-07-21 DIAGNOSIS — J209 Acute bronchitis, unspecified: Secondary | ICD-10-CM | POA: Diagnosis not present

## 2016-07-21 DIAGNOSIS — F419 Anxiety disorder, unspecified: Secondary | ICD-10-CM | POA: Diagnosis not present

## 2016-07-21 DIAGNOSIS — I10 Essential (primary) hypertension: Secondary | ICD-10-CM | POA: Diagnosis not present

## 2016-07-21 DIAGNOSIS — M1990 Unspecified osteoarthritis, unspecified site: Secondary | ICD-10-CM | POA: Diagnosis not present

## 2016-07-21 DIAGNOSIS — Z950 Presence of cardiac pacemaker: Secondary | ICD-10-CM | POA: Diagnosis not present

## 2016-07-29 ENCOUNTER — Telehealth: Payer: Self-pay | Admitting: Internal Medicine

## 2016-07-29 DIAGNOSIS — F419 Anxiety disorder, unspecified: Secondary | ICD-10-CM | POA: Diagnosis not present

## 2016-07-29 DIAGNOSIS — I10 Essential (primary) hypertension: Secondary | ICD-10-CM | POA: Diagnosis not present

## 2016-07-29 DIAGNOSIS — M1990 Unspecified osteoarthritis, unspecified site: Secondary | ICD-10-CM | POA: Diagnosis not present

## 2016-07-29 DIAGNOSIS — I482 Chronic atrial fibrillation: Secondary | ICD-10-CM | POA: Diagnosis not present

## 2016-07-29 DIAGNOSIS — J209 Acute bronchitis, unspecified: Secondary | ICD-10-CM | POA: Diagnosis not present

## 2016-07-29 DIAGNOSIS — Z950 Presence of cardiac pacemaker: Secondary | ICD-10-CM | POA: Diagnosis not present

## 2016-07-29 NOTE — Telephone Encounter (Signed)
SPOKE [place]  TRIAMTERENE  WAS  NOT  CONTINUED  .PT   HAS   EDEMA   IN LEGS  WITH  3 CM  INCREASE  IN DIAMETER .REVIEWED  WITH  DR  Lovena Le  PT  MAY  RESTART  TRIAMTERENE 37.5 /25 MG   PT  AWARE   AND  WILL CALL IF  NO  IMPROVEMENT .Adonis Housekeeper

## 2016-07-29 NOTE — Telephone Encounter (Signed)
New message   Pt c/o swelling: STAT is pt has developed SOB within 24 hours  1. How long have you been experiencing swelling? A week  2. Where is the swelling located? legs  3.  Are you currently taking a "fluid pill"? Triamterene hctz 37.5-25 mg tabs-home health nurse is calling to ask if it's ok for pt to take.   4.  Are you currently SOB? No. Pt just complains of being more tired.  5.  Have you traveled recently? no

## 2016-08-03 DIAGNOSIS — Z950 Presence of cardiac pacemaker: Secondary | ICD-10-CM | POA: Diagnosis not present

## 2016-08-03 DIAGNOSIS — I10 Essential (primary) hypertension: Secondary | ICD-10-CM | POA: Diagnosis not present

## 2016-08-03 DIAGNOSIS — I482 Chronic atrial fibrillation: Secondary | ICD-10-CM | POA: Diagnosis not present

## 2016-08-03 DIAGNOSIS — M1990 Unspecified osteoarthritis, unspecified site: Secondary | ICD-10-CM | POA: Diagnosis not present

## 2016-08-03 DIAGNOSIS — F419 Anxiety disorder, unspecified: Secondary | ICD-10-CM | POA: Diagnosis not present

## 2016-08-03 DIAGNOSIS — J209 Acute bronchitis, unspecified: Secondary | ICD-10-CM | POA: Diagnosis not present

## 2016-08-13 DIAGNOSIS — Z950 Presence of cardiac pacemaker: Secondary | ICD-10-CM | POA: Diagnosis not present

## 2016-08-13 DIAGNOSIS — I10 Essential (primary) hypertension: Secondary | ICD-10-CM | POA: Diagnosis not present

## 2016-08-13 DIAGNOSIS — J209 Acute bronchitis, unspecified: Secondary | ICD-10-CM | POA: Diagnosis not present

## 2016-08-13 DIAGNOSIS — M1990 Unspecified osteoarthritis, unspecified site: Secondary | ICD-10-CM | POA: Diagnosis not present

## 2016-08-13 DIAGNOSIS — F419 Anxiety disorder, unspecified: Secondary | ICD-10-CM | POA: Diagnosis not present

## 2016-08-13 DIAGNOSIS — I482 Chronic atrial fibrillation: Secondary | ICD-10-CM | POA: Diagnosis not present

## 2016-08-31 DIAGNOSIS — Z7901 Long term (current) use of anticoagulants: Secondary | ICD-10-CM | POA: Diagnosis not present

## 2016-08-31 DIAGNOSIS — I482 Chronic atrial fibrillation: Secondary | ICD-10-CM | POA: Diagnosis not present

## 2016-09-28 ENCOUNTER — Encounter: Payer: Self-pay | Admitting: Internal Medicine

## 2016-09-28 DIAGNOSIS — R001 Bradycardia, unspecified: Secondary | ICD-10-CM | POA: Diagnosis not present

## 2016-09-30 DIAGNOSIS — E7800 Pure hypercholesterolemia, unspecified: Secondary | ICD-10-CM | POA: Diagnosis not present

## 2016-09-30 DIAGNOSIS — E0520 Thyrotoxicosis with toxic multinodular goiter without thyrotoxic crisis or storm: Secondary | ICD-10-CM | POA: Diagnosis not present

## 2016-09-30 DIAGNOSIS — I10 Essential (primary) hypertension: Secondary | ICD-10-CM | POA: Diagnosis not present

## 2016-10-03 DIAGNOSIS — I495 Sick sinus syndrome: Secondary | ICD-10-CM | POA: Diagnosis not present

## 2016-10-06 DIAGNOSIS — E7800 Pure hypercholesterolemia, unspecified: Secondary | ICD-10-CM | POA: Diagnosis not present

## 2016-10-06 DIAGNOSIS — M1990 Unspecified osteoarthritis, unspecified site: Secondary | ICD-10-CM | POA: Diagnosis not present

## 2016-10-06 DIAGNOSIS — I10 Essential (primary) hypertension: Secondary | ICD-10-CM | POA: Diagnosis not present

## 2016-10-06 DIAGNOSIS — N390 Urinary tract infection, site not specified: Secondary | ICD-10-CM | POA: Diagnosis not present

## 2016-10-06 DIAGNOSIS — Z6827 Body mass index (BMI) 27.0-27.9, adult: Secondary | ICD-10-CM | POA: Diagnosis not present

## 2016-10-06 DIAGNOSIS — G458 Other transient cerebral ischemic attacks and related syndromes: Secondary | ICD-10-CM | POA: Diagnosis not present

## 2016-10-06 DIAGNOSIS — I482 Chronic atrial fibrillation: Secondary | ICD-10-CM | POA: Diagnosis not present

## 2016-10-06 DIAGNOSIS — Z7901 Long term (current) use of anticoagulants: Secondary | ICD-10-CM | POA: Diagnosis not present

## 2016-10-06 DIAGNOSIS — E871 Hypo-osmolality and hyponatremia: Secondary | ICD-10-CM | POA: Diagnosis not present

## 2016-10-06 DIAGNOSIS — E0520 Thyrotoxicosis with toxic multinodular goiter without thyrotoxic crisis or storm: Secondary | ICD-10-CM | POA: Diagnosis not present

## 2016-10-06 DIAGNOSIS — Z0000 Encounter for general adult medical examination without abnormal findings: Secondary | ICD-10-CM | POA: Diagnosis not present

## 2016-10-06 DIAGNOSIS — R8299 Other abnormal findings in urine: Secondary | ICD-10-CM | POA: Diagnosis not present

## 2016-10-06 DIAGNOSIS — I6789 Other cerebrovascular disease: Secondary | ICD-10-CM | POA: Diagnosis not present

## 2016-10-06 DIAGNOSIS — Z1389 Encounter for screening for other disorder: Secondary | ICD-10-CM | POA: Diagnosis not present

## 2016-11-04 DIAGNOSIS — G459 Transient cerebral ischemic attack, unspecified: Secondary | ICD-10-CM | POA: Diagnosis not present

## 2016-11-04 DIAGNOSIS — I679 Cerebrovascular disease, unspecified: Secondary | ICD-10-CM | POA: Diagnosis not present

## 2016-11-04 DIAGNOSIS — Z7901 Long term (current) use of anticoagulants: Secondary | ICD-10-CM | POA: Diagnosis not present

## 2016-11-04 DIAGNOSIS — I482 Chronic atrial fibrillation: Secondary | ICD-10-CM | POA: Diagnosis not present

## 2016-11-29 ENCOUNTER — Other Ambulatory Visit: Payer: Self-pay | Admitting: Internal Medicine

## 2016-11-29 DIAGNOSIS — Z23 Encounter for immunization: Secondary | ICD-10-CM | POA: Diagnosis not present

## 2016-12-06 ENCOUNTER — Other Ambulatory Visit: Payer: Self-pay | Admitting: *Deleted

## 2016-12-06 MED ORDER — TRIAMTERENE-HCTZ 37.5-25 MG PO CAPS: 1.0000 | ORAL_CAPSULE | Freq: Every day | ORAL | 6 refills | Status: DC

## 2016-12-08 DIAGNOSIS — I482 Chronic atrial fibrillation: Secondary | ICD-10-CM | POA: Diagnosis not present

## 2016-12-08 DIAGNOSIS — Z7901 Long term (current) use of anticoagulants: Secondary | ICD-10-CM | POA: Diagnosis not present

## 2016-12-08 DIAGNOSIS — G458 Other transient cerebral ischemic attacks and related syndromes: Secondary | ICD-10-CM | POA: Diagnosis not present

## 2016-12-28 ENCOUNTER — Encounter: Payer: Medicare Other | Admitting: Internal Medicine

## 2016-12-29 ENCOUNTER — Ambulatory Visit (INDEPENDENT_AMBULATORY_CARE_PROVIDER_SITE_OTHER): Payer: Medicare Other | Admitting: *Deleted

## 2016-12-29 ENCOUNTER — Encounter (INDEPENDENT_AMBULATORY_CARE_PROVIDER_SITE_OTHER): Payer: Self-pay

## 2016-12-29 DIAGNOSIS — I4891 Unspecified atrial fibrillation: Secondary | ICD-10-CM

## 2016-12-29 DIAGNOSIS — R609 Edema, unspecified: Secondary | ICD-10-CM

## 2016-12-29 DIAGNOSIS — R0602 Shortness of breath: Secondary | ICD-10-CM

## 2016-12-29 LAB — CUP PACEART INCLINIC DEVICE CHECK
Battery Voltage: 2.78 V
Date Time Interrogation Session: 20181003113556
Implantable Pulse Generator Implant Date: 20070501
Lead Channel Pacing Threshold Amplitude: 1 V
Lead Channel Pacing Threshold Pulse Width: 0.4 ms
Lead Channel Setting Sensing Sensitivity: 2 mV
MDC IDC LEAD IMPLANT DT: 20070501
MDC IDC LEAD LOCATION: 753860
MDC IDC MSMT BATTERY IMPEDANCE: 2000 Ohm
MDC IDC MSMT LEADCHNL RV IMPEDANCE VALUE: 349 Ohm
MDC IDC SET LEADCHNL RV PACING PULSEWIDTH: 0.4 ms
Pulse Gen Model: 5156
Pulse Gen Serial Number: 1664242

## 2016-12-29 NOTE — Patient Instructions (Addendum)
STOP HCTZ/Triamterene  INCREASE Lasix to 40 mg daily  BMP in 1 week  Follow-up with Dr. Lovena Le in 2 weeks

## 2016-12-29 NOTE — Progress Notes (Signed)
Pacemaker check in clinic. Normal device function. Threshold, sensing, impedance consistent with previous measurements. Device programmed to maximize longevity. Episodes are not available. Device programmed at appropriate safety margins. Histogram distribution blunted, patient c/o increase SOB, edema in legs, and fatigue; reviewed with Dr. Lovena Le-- recommended to STOP Triamterene-HCTZ, INCREASE Lasix to 40 mg daily, BMP in 1 week, F/U with GT in 2 weeks. Device programmed to optimize intrinsic conduction. Estimated longevity 5.5-6.5 years. Patient education completed. BMP 01/05/17, ROV with GT 01/19/17.

## 2016-12-30 ENCOUNTER — Telehealth: Payer: Self-pay | Admitting: Internal Medicine

## 2016-12-30 DIAGNOSIS — R609 Edema, unspecified: Secondary | ICD-10-CM

## 2016-12-30 MED ORDER — FUROSEMIDE 20 MG PO TABS: 40.0000 mg | ORAL_TABLET | Freq: Every day | ORAL | 0 refills | Status: DC

## 2016-12-30 NOTE — Telephone Encounter (Signed)
New Message    If no answer at home call her on her cellphone  Pt seen shakila yesterday and she told her to take 2-20mg  tablets of furosemide per pt.    Pt c/o medication issue:  1. Name of Medication:  Furosemide   2. How are you currently taking this medication (dosage and times per day)?  Was told to take 40 mg   3. Are you having a reaction (difficulty breathing--STAT)? No  4. What is your medication issue?  The medication she has is expired , and  She doesn't want to take furosemide because the side effect says it makes you hard of hearing, is there something else she can take ??   Pt uses [facility] on [address]

## 2016-12-30 NOTE — Telephone Encounter (Signed)
Call placed to Pt.  Call went to VM.  Left message for Pt, notified Pt that I would ask Dr. Lovena Le if he could prescribe her something other than furosemide.  Notified Pt that Dr. Lovena Le in surgery next 2 days and may be a delay in getting her an alternative prescription.  Notified Pt I would send a 7 day supply of furosemide to her pharmacy.  Asked her to take the furosemide until I could get her changed to something else.  Left this nurse name and # for call back if Pt had any concerns.

## 2017-01-03 ENCOUNTER — Encounter (HOSPITAL_COMMUNITY): Payer: Self-pay | Admitting: Emergency Medicine

## 2017-01-03 ENCOUNTER — Emergency Department (HOSPITAL_COMMUNITY): Payer: Medicare Other

## 2017-01-03 ENCOUNTER — Telehealth: Payer: Self-pay | Admitting: Internal Medicine

## 2017-01-03 ENCOUNTER — Observation Stay (HOSPITAL_COMMUNITY)
Admission: EM | Admit: 2017-01-03 | Discharge: 2017-01-04 | Disposition: A | Discharge status: Home / Self Care | Payer: Medicare Other | Attending: Internal Medicine | Admitting: Internal Medicine

## 2017-01-03 DIAGNOSIS — E0590 Thyrotoxicosis, unspecified without thyrotoxic crisis or storm: Secondary | ICD-10-CM | POA: Insufficient documentation

## 2017-01-03 DIAGNOSIS — J209 Acute bronchitis, unspecified: Secondary | ICD-10-CM | POA: Insufficient documentation

## 2017-01-03 DIAGNOSIS — K219 Gastro-esophageal reflux disease without esophagitis: Secondary | ICD-10-CM | POA: Diagnosis not present

## 2017-01-03 DIAGNOSIS — I4891 Unspecified atrial fibrillation: Secondary | ICD-10-CM | POA: Diagnosis not present

## 2017-01-03 DIAGNOSIS — Z87442 Personal history of urinary calculi: Secondary | ICD-10-CM | POA: Diagnosis not present

## 2017-01-03 DIAGNOSIS — M1990 Unspecified osteoarthritis, unspecified site: Secondary | ICD-10-CM | POA: Insufficient documentation

## 2017-01-03 DIAGNOSIS — R52 Pain, unspecified: Secondary | ICD-10-CM | POA: Diagnosis not present

## 2017-01-03 DIAGNOSIS — G8929 Other chronic pain: Secondary | ICD-10-CM | POA: Diagnosis not present

## 2017-01-03 DIAGNOSIS — R609 Edema, unspecified: Secondary | ICD-10-CM

## 2017-01-03 DIAGNOSIS — Z950 Presence of cardiac pacemaker: Secondary | ICD-10-CM | POA: Diagnosis not present

## 2017-01-03 DIAGNOSIS — R0602 Shortness of breath: Secondary | ICD-10-CM | POA: Insufficient documentation

## 2017-01-03 DIAGNOSIS — K449 Diaphragmatic hernia without obstruction or gangrene: Secondary | ICD-10-CM | POA: Diagnosis not present

## 2017-01-03 DIAGNOSIS — I10 Essential (primary) hypertension: Secondary | ICD-10-CM | POA: Diagnosis not present

## 2017-01-03 DIAGNOSIS — Z806 Family history of leukemia: Secondary | ICD-10-CM | POA: Insufficient documentation

## 2017-01-03 DIAGNOSIS — Z87891 Personal history of nicotine dependence: Secondary | ICD-10-CM | POA: Diagnosis not present

## 2017-01-03 DIAGNOSIS — E049 Nontoxic goiter, unspecified: Secondary | ICD-10-CM | POA: Insufficient documentation

## 2017-01-03 DIAGNOSIS — E222 Syndrome of inappropriate secretion of antidiuretic hormone: Secondary | ICD-10-CM | POA: Insufficient documentation

## 2017-01-03 DIAGNOSIS — M542 Cervicalgia: Secondary | ICD-10-CM | POA: Diagnosis not present

## 2017-01-03 DIAGNOSIS — Z8249 Family history of ischemic heart disease and other diseases of the circulatory system: Secondary | ICD-10-CM | POA: Insufficient documentation

## 2017-01-03 DIAGNOSIS — M4312 Spondylolisthesis, cervical region: Secondary | ICD-10-CM | POA: Diagnosis not present

## 2017-01-03 DIAGNOSIS — Z96651 Presence of right artificial knee joint: Secondary | ICD-10-CM | POA: Diagnosis not present

## 2017-01-03 DIAGNOSIS — Q396 Congenital diverticulum of esophagus: Secondary | ICD-10-CM | POA: Insufficient documentation

## 2017-01-03 DIAGNOSIS — Z85828 Personal history of other malignant neoplasm of skin: Secondary | ICD-10-CM | POA: Insufficient documentation

## 2017-01-03 DIAGNOSIS — R51 Headache: Secondary | ICD-10-CM | POA: Diagnosis not present

## 2017-01-03 DIAGNOSIS — I480 Paroxysmal atrial fibrillation: Secondary | ICD-10-CM

## 2017-01-03 DIAGNOSIS — Z96641 Presence of right artificial hip joint: Secondary | ICD-10-CM | POA: Diagnosis not present

## 2017-01-03 DIAGNOSIS — M1610 Unilateral primary osteoarthritis, unspecified hip: Secondary | ICD-10-CM | POA: Insufficient documentation

## 2017-01-03 DIAGNOSIS — Z79899 Other long term (current) drug therapy: Secondary | ICD-10-CM | POA: Insufficient documentation

## 2017-01-03 DIAGNOSIS — Z90710 Acquired absence of both cervix and uterus: Secondary | ICD-10-CM | POA: Insufficient documentation

## 2017-01-03 DIAGNOSIS — Z7901 Long term (current) use of anticoagulants: Secondary | ICD-10-CM

## 2017-01-03 DIAGNOSIS — F419 Anxiety disorder, unspecified: Secondary | ICD-10-CM | POA: Diagnosis not present

## 2017-01-03 DIAGNOSIS — R001 Bradycardia, unspecified: Secondary | ICD-10-CM | POA: Diagnosis not present

## 2017-01-03 DIAGNOSIS — Z885 Allergy status to narcotic agent status: Secondary | ICD-10-CM | POA: Insufficient documentation

## 2017-01-03 LAB — COMPREHENSIVE METABOLIC PANEL
ALBUMIN: 4.4 g/dL (ref 3.5–5.0)
ALK PHOS: 96 U/L (ref 38–126)
ALT: 12 U/L — AB (ref 14–54)
ANION GAP: 12 (ref 5–15)
AST: 42 U/L — AB (ref 15–41)
BILIRUBIN TOTAL: 1.7 mg/dL — AB (ref 0.3–1.2)
BUN: 17 mg/dL (ref 6–20)
CALCIUM: 9.3 mg/dL (ref 8.9–10.3)
CO2: 26 mmol/L (ref 22–32)
CREATININE: 1.07 mg/dL — AB (ref 0.44–1.00)
Chloride: 94 mmol/L — ABNORMAL LOW (ref 101–111)
GFR calc Af Amer: 52 mL/min — ABNORMAL LOW (ref >60)
GFR calc non Af Amer: 45 mL/min — ABNORMAL LOW (ref >60)
GLUCOSE: 117 mg/dL — AB (ref 65–99)
Potassium: 4.9 mmol/L (ref 3.5–5.1)
Sodium: 132 mmol/L — ABNORMAL LOW (ref 135–145)
TOTAL PROTEIN: 7.7 g/dL (ref 6.5–8.1)

## 2017-01-03 LAB — CBC WITH DIFFERENTIAL/PLATELET
BASOS ABS: 0 10*3/uL (ref 0.0–0.1)
BASOS PCT: 0 %
EOS ABS: 0 10*3/uL (ref 0.0–0.7)
EOS PCT: 0 %
HCT: 36.8 % (ref 36.0–46.0)
Hemoglobin: 12.3 g/dL (ref 12.0–15.0)
Lymphocytes Relative: 9 %
Lymphs Abs: 1 10*3/uL (ref 0.7–4.0)
MCH: 29.5 pg (ref 26.0–34.0)
MCHC: 33.4 g/dL (ref 30.0–36.0)
MCV: 88.2 fL (ref 78.0–100.0)
MONO ABS: 0.6 10*3/uL (ref 0.1–1.0)
MONOS PCT: 6 %
Neutro Abs: 9.4 10*3/uL — ABNORMAL HIGH (ref 1.7–7.7)
Neutrophils Relative %: 85 %
Platelets: 276 10*3/uL (ref 150–400)
RBC: 4.17 MIL/uL (ref 3.87–5.11)
RDW: 14.8 % (ref 11.5–15.5)
WBC: 11.1 10*3/uL — ABNORMAL HIGH (ref 4.0–10.5)

## 2017-01-03 LAB — PROTIME-INR
INR: 1.82
Prothrombin Time: 20.9 seconds — ABNORMAL HIGH (ref 11.4–15.2)

## 2017-01-03 MED ORDER — MORPHINE SULFATE (PF) 2 MG/ML IV SOLN
2.0000 mg | Freq: Once | INTRAVENOUS | Status: DC
  Filled 2017-01-03: qty 1

## 2017-01-03 MED ORDER — KETOROLAC TROMETHAMINE 60 MG/2ML IM SOLN
60.0000 mg | Freq: Once | INTRAMUSCULAR | Status: AC
  Administered 2017-01-03: 60 mg via INTRAMUSCULAR
  Filled 2017-01-03: qty 2

## 2017-01-03 MED ORDER — OXYCODONE-ACETAMINOPHEN 5-325 MG PO TABS
1.0000 | ORAL_TABLET | Freq: Once | ORAL | Status: AC
  Administered 2017-01-03: 1 via ORAL
  Filled 2017-01-03: qty 1

## 2017-01-03 MED ORDER — IOPAMIDOL (ISOVUE-370) INJECTION 76%
75.0000 mL | Freq: Once | INTRAVENOUS | Status: AC | PRN
  Administered 2017-01-03: 75 mL via INTRAVENOUS

## 2017-01-03 MED ORDER — ONDANSETRON HCL 4 MG PO TABS
4.0000 mg | ORAL_TABLET | Freq: Once | ORAL | Status: AC
  Administered 2017-01-03: 4 mg via ORAL
  Filled 2017-01-03: qty 1

## 2017-01-03 MED ORDER — SODIUM CHLORIDE 0.9 % IV SOLN
Freq: Once | INTRAVENOUS | Status: AC
  Administered 2017-01-04: 01:00:00 via INTRAVENOUS

## 2017-01-03 MED ORDER — FAMOTIDINE IN NACL 20-0.9 MG/50ML-% IV SOLN
20.0000 mg | Freq: Once | INTRAVENOUS | Status: AC
  Administered 2017-01-03: 20 mg via INTRAVENOUS
  Filled 2017-01-03: qty 50

## 2017-01-03 MED ORDER — IOPAMIDOL (ISOVUE-370) INJECTION 76%
INTRAVENOUS | Status: AC
  Filled 2017-01-03: qty 100

## 2017-01-03 MED ORDER — METHYLPREDNISOLONE SODIUM SUCC 125 MG IJ SOLR
125.0000 mg | Freq: Once | INTRAMUSCULAR | Status: AC
  Administered 2017-01-04: 125 mg via INTRAVENOUS
  Filled 2017-01-03: qty 2

## 2017-01-03 NOTE — ED Provider Notes (Signed)
[facility] DEPT Provider Note   CSN: [id number] Arrival date & time: 01/03/17  1237     History   Chief Complaint Chief Complaint  Patient presents with  . Neck Pain    left  . Headache    HPI [name] is a 83 y.o. female.  HPI Patient was feeling well last night. She woke this morning with "crick in her neck". She had pain in the left side of her neck that was sharp and uncomfortable. She did not have weakness numbness or tingling to the extremities. Patient did continue on to church for the pain was getting much worse. The more active she was a more painful it became. Some people tried to massage it for her but it did not get better. She had tried 2 Tylenol this morning with no significant relief. While awaiting evaluation emergency department it had improved somewhat at rest. She felt all right and got up to walk to the bathroom but after that activity he came back and is severe again. Patient is able to ambulate at baseline using her cane. She denies any upper extremity weakness numbness tingling. She denies radiation of pain into her arms. No visual changes or confusion. She reports occasionally she's had some problems with neck pain but never this severe. Past Medical History:  Diagnosis Date  . Anxiety   . Atrial fibrillation ([place])   . Bradycardia   . Complication of anesthesia   . Difficulty sleeping   . Dysrhythmia    A FIB  . H/O hiatal hernia   . History of kidney stones   . History of skin cancer   . History of transfusion   . Hypertension   . Osteoarthritis   . Pacemaker   . Pneumonia   . PONV (postoperative nausea and vomiting)   . Shortness of breath    WITH EXERTION    Patient Active Problem List   Diagnosis Date Noted  . Acute bronchitis 06/08/2016  . Fall 09/24/2015  . Dizziness 09/24/2015  . Postoperative anemia due to acute blood loss 12/27/2013  . Postop Transfusion 12/27/2013  . OA (osteoarthritis) of hip 12/24/2013  . Preop  cardiovascular exam 09/25/2013  . Cardiac pacemaker in situ 08/29/2008  . Essential hypertension 08/21/2008  . ATRIAL FIBRILLATION 08/21/2008  . BRADYCARDIA 08/21/2008  . PNEUMONIA 08/21/2008  . GERD 08/21/2008  . NEPHROLITHIASIS 08/21/2008  . OSTEOARTHRITIS 08/21/2008    Past Surgical History:  Procedure Laterality Date  . APPENDECTOMY  1960  . ARTHROPLASTY  1994   Left total knee  . ARTHROPLASTY  2000   Total right knee  . BACK SURGERY    . CHOLECYSTECTOMY    . DG SELECTED [facility] ONLY  2002   Dilation  . JOINT REPLACEMENT     knee replacement  . Microdiskectomy  05/2004   Left L4-L5  . PACEMAKER INSERTION    . TOTAL ABDOMINAL HYSTERECTOMY  1969  . TOTAL HIP ARTHROPLASTY Right 12/24/2013   Procedure: RIGHT TOTAL HIP ARTHROPLASTY ANTERIOR APPROACH;  Surgeon: Gearlean Alf, MD;  Location: [facility];  Service: Orthopedics;  Laterality: Right;    OB History    No data available       Home Medications    Prior to Admission medications   Medication Sig Start Date End Date Taking? Authorizing Provider  acetaminophen (TYLENOL) 325 MG tablet Take 650 mg by mouth every 6 (six) hours as needed (pain).   Yes [provider]  amLODipine (NORVASC) 10  MG tablet Take 10 mg by mouth every morning.    Yes [provider]  clonazePAM (KLONOPIN) 0.5 MG tablet Take 0.5 mg by mouth daily as needed for anxiety.  03/18/15  Yes [provider]  cloNIDine (CATAPRES) 0.1 MG tablet Take 0.1 mg by mouth 2 (two) times daily.    Yes [provider]  irbesartan (AVAPRO) 300 MG tablet Take 300 mg by mouth every morning.   Yes [provider]  methimazole (TAPAZOLE) 5 MG tablet TAKE ONE TABLET BY MOUTH DAILY WITH FOOD 10/28/15  Yes [provider]  pantoprazole (PROTONIX) 40 MG tablet Take 1 tablet (40 mg total) by mouth daily. 06/16/16  Yes Elgergawy, Silver Huguenin, MD  potassium chloride SA (K-DUR,KLOR-CON) 20 MEQ tablet Take 20 mEq by mouth every  morning.    Yes [provider]  pravastatin (PRAVACHOL) 40 MG tablet Take 40 mg by mouth every morning.    Yes [provider]  PREVIDENT 5000 PLUS 1.1 % CREA dental cream Take 1 application by mouth at bedtime. As directed 05/25/16  Yes [provider]  triamterene-hydrochlorothiazide (MAXZIDE-25) 37.5-25 MG tablet Take 1 tablet by mouth daily.   Yes [provider]  warfarin (COUMADIN) 2 MG tablet Take 1-2 tablets (2-4 mg total) by mouth as directed. Take Coumadin for three weeks for postoperative protocol and then the patient may resume their previous Coumadin home regimen.  The dose may need to be adjusted based upon the INR.  Please follow the INR and titrate Coumadin dose for a therapeutic range between 2.0 and 3.0 INR.  After completing the three weeks of Coumadin, the patient may resume their previous Coumadin home regimen. Patient taking differently: Take 2-4 mg by mouth daily. Take 2mg  by mouth Sunday, Tuesday, Wednesday, Friday, and saturday. Take 4mg  by mouth on Monday and Thursday. 12/27/13  Yes Perkins, Alexzandrew L, PA-C  furosemide (LASIX) 20 MG tablet Take 2 tablets (40 mg total) by mouth daily. Patient not taking: Reported on 01/03/2017 12/30/16 03/30/17  Evans Lance, MD  sucralfate (CARAFATE) 1 g tablet Take 1 tablet (1 g total) by mouth 4 (four) times daily -  with meals and at bedtime. 06/15/16 07/15/16  Elgergawy, Silver Huguenin, MD    Family History Family History  Problem Relation Age of Onset  . Heart disease Mother   . Leukemia Father     Social History Social History  Substance Use Topics  . Smoking status: Former Smoker    Quit date: 12/18/1973  . Smokeless tobacco: Former Systems developer     Comment: Smoker for 25 years and quit at around age 26  . Alcohol use No     Allergies   Morphine and related   Review of Systems Review of Systems 10 Systems reviewed and are negative for acute change except as noted in the HPI.   Physical  Exam Updated Vital Signs BP (!) 143/84   Pulse 60   Temp 97.6 F (36.4 C) (Oral)   Resp 13   SpO2 97%   Physical Exam  Constitutional: She is oriented to person, place, and time. She appears well-developed and well-nourished. No distress.  As I enter the room, patient is returning from the bathroom. She is up on her feet and ambulatory. Her mental status is clear. Her movements are coordinated and purposeful.  HENT:  Head: Normocephalic and atraumatic.  Right Ear: External ear normal.  Left Ear: External ear normal.  Nose: Nose normal.  Mouth/Throat: Oropharynx is clear  and moist.  Eyes: Pupils are equal, round, and reactive to light. Conjunctivae and EOM are normal.  Neck: Neck supple.  Patient indicates pain is predominantly at the left slightly lateral to midline at about C4 area. She is able to turn the head side to side. Anterior neck soft without abnormal soft tissue swelling.  Cardiovascular: Normal rate, regular rhythm, normal heart sounds and intact distal pulses.   No murmur heard. Pulmonary/Chest: Effort normal and breath sounds normal. No respiratory distress.  Abdominal: Soft. There is no tenderness.  Musculoskeletal: Normal range of motion. She exhibits no edema or tenderness.  Neurological: She is alert and oriented to person, place, and time. No cranial nerve deficit or sensory deficit. She exhibits normal muscle tone. Coordination normal.  Skin: Skin is warm and dry.  Psychiatric: She has a normal mood and affect.  Nursing note and vitals reviewed.    ED Treatments / Results  Labs (all labs ordered are listed, but only abnormal results are displayed) Labs Reviewed  COMPREHENSIVE METABOLIC PANEL - Abnormal; Notable for the following:       Result Value   Sodium 132 (*)    Chloride 94 (*)    Glucose, Bld 117 (*)    Creatinine, Ser 1.07 (*)    AST 42 (*)    ALT 12 (*)    Total Bilirubin 1.7 (*)    GFR calc non Af Amer 45 (*)    GFR calc Af Amer 52 (*)     All other components within normal limits  CBC WITH DIFFERENTIAL/PLATELET - Abnormal; Notable for the following:    WBC 11.1 (*)    Neutro Abs 9.4 (*)    All other components within normal limits  PROTIME-INR - Abnormal; Notable for the following:    Prothrombin Time 20.9 (*)    All other components within normal limits    EKG  EKG Interpretation  Date/Time:  Monday January 03 2017 19:45:13 EDT Ventricular Rate:  60 PR Interval:    QRS Duration: 149 QT Interval:  480 QTC Calculation: 480 R Axis:   0 Text Interpretation:  Ventricular-paced rhythm No further analysis attempted due to paced rhythm agree. no change from previous Confirmed by Charlesetta Shanks [phone]) on 01/03/2017 9:09:59 PM       Radiology Ct Angio Head W Or Wo Contrast  Result Date: 01/03/2017 CLINICAL DATA:  Initial evaluation for left-sided neck pain. EXAM: CT ANGIOGRAPHY HEAD AND NECK TECHNIQUE: Multidetector CT imaging of the head and neck was performed using the standard protocol during bolus administration of intravenous contrast. Multiplanar CT image reconstructions and MIPs were obtained to evaluate the vascular anatomy. Carotid stenosis measurements (when applicable) are obtained utilizing NASCET criteria, using the distal internal carotid diameter as the denominator. CONTRAST:  75 cc of Isovue 370. COMPARISON:  Prior CT from 09/24/2015. FINDINGS: CT HEAD FINDINGS Brain: Age-related cerebral atrophy with mild chronic small vessel ischemic disease. No acute intracranial hemorrhage. No evidence for acute large vessel territory infarct. No mass lesion, midline shift or mass effect. No hydrocephalus. No extra-axial fluid collection. Vascular: No hyperdense vessel. Scattered vascular calcifications noted within the carotid siphons. Skull: Scalp soft tissues and calvarium within normal limits. Sinuses: Paranasal sinuses and mastoid air cells are clear. Orbits: Globes and orbital soft tissues within normal limits. Review of  the MIP images confirms the above findings CTA NECK FINDINGS Aortic arch: Visualized aortic arch of normal caliber with normal 3 vessel morphology. Mild scattered plaque within the arch itself and  about the origin of the great vessels without flow-limiting stenosis. Visualized subclavian artery is widely patent. Right carotid system: Right common carotid artery patent from its origin to the bifurcation without stenosis. Mild a centric calcified plaque about the right bifurcation without stenosis. Right ICA patent distally to the skullbase without stenosis, dissection, or occlusion. Left carotid system: Left common carotid artery patent from its origin to the bifurcation without stenosis. A centric calcified plaque about the left bifurcation without flow-limiting stenosis. Left ICA patent distally to the skullbase without stenosis, dissection, or occlusion. Vertebral arteries: Both of the vertebral arteries arise from the subclavian arteries. Vertebral arteries widely patent within the neck without stenosis, dissection, or occlusion. Skeleton: No acute osseus abnormality. No worrisome lytic or blastic osseous lesions. Grade 1 anterolisthesis of C4 on C5, likely due to chronic facet degeneration. A advanced degenerative spondylolysis present at C5-6 and C6-7. Other neck: Soft tissues of the neck demonstrate no acute abnormality. Salivary glands within normal limits. Enlarged thyroid cord or noted. 1.4 x 1.7 cm focal outpouching containing air and fluid extends from the left lateral aspect of the upper esophagus, consistent with an esophageal diverticulum. Intraluminal fluid density present distally within the visualized esophageal lumen. Upper chest: Visualized upper chest demonstrates no acute abnormality. Partially visualized lungs are grossly clear. Review of the MIP images confirms the above findings CTA HEAD FINDINGS Anterior circulation: Petrous segments widely patent bilaterally. Scattered calcified  atheromatous plaque within the cavernous/ supraclinoid ICAs without flow-limiting stenosis. ICA termini widely patent. A1 segments patent bilaterally. Anterior communicating artery normal. Anterior cerebral arteries patent to their distal aspects without stenosis. M1 segments patent without stenosis or occlusion. Normal MCA bifurcations. No proximal M2 occlusion. Distal MCA branches well perfused and symmetric. Posterior circulation: Focal non stenotic plaque noted within the left V4 segment as it crosses into the cranial vault. Vertebral arteries otherwise widely patent to the vertebrobasilar junction. Posterior inferior cerebral arteries patent bilaterally. Basilar artery widely patent to its distal aspect. Superior cerebral arteries patent bilaterally. Posterior cerebral arteries well perfused and widely patent to their distal aspects without stenosis. Prominent right posterior communicating artery noted. Venous sinuses: Patent. Anatomic variants: None significant. No aneurysm or vascular malformation. Delayed phase: No abnormal enhancement. Review of the MIP images confirms the above findings IMPRESSION: 1. No acute vascular abnormality involving the major arterial vasculature of the head and neck. 2. Mild atheromatous disease for patient age without flow-limiting stenosis. 3. Thyroid goiter. 4. Esophageal diverticulum Electronically Signed   By: Jeannine Boga M.D.   On: 01/03/2017 22:17   Dg Cervical Spine 2-3 Views  Result Date: 01/03/2017 CLINICAL DATA:  Awakened this morning EXAM: CERVICAL SPINE - 2-3 VIEW COMPARISON:  Coronal and sagittal reconstructed images from a head and neck CT scan dated 01/24/2016. FINDINGS: The cervical vertebral bodies are preserved in height. There is 4 mm of anterolisthesis of C4 with respect to C5. There is moderate degenerative disc space narrowing at C5-6 and C6-7 with large anterior endplate osteophytes. There is multilevel facet joint hypertrophy but no perched  facet is observed. The spinous processes are intact. The odontoid is intact where visualized. The prevertebral soft tissue spaces are normal. IMPRESSION: Degenerative disc disease centered at C5-6 and C6-7. Grade 1 anterolisthesis of C4 with respect to C5 likely on the basis of facet joint degenerative change. No compression fracture. Electronically Signed   By: David  Martinique M.D.   On: 01/03/2017 17:13   Ct Angio Neck W And/or Wo Contrast  Result Date:  01/03/2017 CLINICAL DATA:  Initial evaluation for left-sided neck pain. EXAM: CT ANGIOGRAPHY HEAD AND NECK TECHNIQUE: Multidetector CT imaging of the head and neck was performed using the standard protocol during bolus administration of intravenous contrast. Multiplanar CT image reconstructions and MIPs were obtained to evaluate the vascular anatomy. Carotid stenosis measurements (when applicable) are obtained utilizing NASCET criteria, using the distal internal carotid diameter as the denominator. CONTRAST:  75 cc of Isovue 370. COMPARISON:  Prior CT from 09/24/2015. FINDINGS: CT HEAD FINDINGS Brain: Age-related cerebral atrophy with mild chronic small vessel ischemic disease. No acute intracranial hemorrhage. No evidence for acute large vessel territory infarct. No mass lesion, midline shift or mass effect. No hydrocephalus. No extra-axial fluid collection. Vascular: No hyperdense vessel. Scattered vascular calcifications noted within the carotid siphons. Skull: Scalp soft tissues and calvarium within normal limits. Sinuses: Paranasal sinuses and mastoid air cells are clear. Orbits: Globes and orbital soft tissues within normal limits. Review of the MIP images confirms the above findings CTA NECK FINDINGS Aortic arch: Visualized aortic arch of normal caliber with normal 3 vessel morphology. Mild scattered plaque within the arch itself and about the origin of the great vessels without flow-limiting stenosis. Visualized subclavian artery is widely patent. Right  carotid system: Right common carotid artery patent from its origin to the bifurcation without stenosis. Mild a centric calcified plaque about the right bifurcation without stenosis. Right ICA patent distally to the skullbase without stenosis, dissection, or occlusion. Left carotid system: Left common carotid artery patent from its origin to the bifurcation without stenosis. A centric calcified plaque about the left bifurcation without flow-limiting stenosis. Left ICA patent distally to the skullbase without stenosis, dissection, or occlusion. Vertebral arteries: Both of the vertebral arteries arise from the subclavian arteries. Vertebral arteries widely patent within the neck without stenosis, dissection, or occlusion. Skeleton: No acute osseus abnormality. No worrisome lytic or blastic osseous lesions. Grade 1 anterolisthesis of C4 on C5, likely due to chronic facet degeneration. A advanced degenerative spondylolysis present at C5-6 and C6-7. Other neck: Soft tissues of the neck demonstrate no acute abnormality. Salivary glands within normal limits. Enlarged thyroid cord or noted. 1.4 x 1.7 cm focal outpouching containing air and fluid extends from the left lateral aspect of the upper esophagus, consistent with an esophageal diverticulum. Intraluminal fluid density present distally within the visualized esophageal lumen. Upper chest: Visualized upper chest demonstrates no acute abnormality. Partially visualized lungs are grossly clear. Review of the MIP images confirms the above findings CTA HEAD FINDINGS Anterior circulation: Petrous segments widely patent bilaterally. Scattered calcified atheromatous plaque within the cavernous/ supraclinoid ICAs without flow-limiting stenosis. ICA termini widely patent. A1 segments patent bilaterally. Anterior communicating artery normal. Anterior cerebral arteries patent to their distal aspects without stenosis. M1 segments patent without stenosis or occlusion. Normal MCA  bifurcations. No proximal M2 occlusion. Distal MCA branches well perfused and symmetric. Posterior circulation: Focal non stenotic plaque noted within the left V4 segment as it crosses into the cranial vault. Vertebral arteries otherwise widely patent to the vertebrobasilar junction. Posterior inferior cerebral arteries patent bilaterally. Basilar artery widely patent to its distal aspect. Superior cerebral arteries patent bilaterally. Posterior cerebral arteries well perfused and widely patent to their distal aspects without stenosis. Prominent right posterior communicating artery noted. Venous sinuses: Patent. Anatomic variants: None significant. No aneurysm or vascular malformation. Delayed phase: No abnormal enhancement. Review of the MIP images confirms the above findings IMPRESSION: 1. No acute vascular abnormality involving the major arterial vasculature of the  head and neck. 2. Mild atheromatous disease for patient age without flow-limiting stenosis. 3. Thyroid goiter. 4. Esophageal diverticulum Electronically Signed   By: Jeannine Boga M.D.   On: 01/03/2017 22:17    Procedures Procedures (including critical care time)  Medications Ordered in ED Medications  iopamidol (ISOVUE-370) 76 % injection (not administered)  morphine 2 MG/ML injection 2 mg (2 mg Intravenous Not Given 01/03/17 2248)  0.9 %  sodium chloride infusion (not administered)  methylPREDNISolone sodium succinate (SOLU-MEDROL) 125 mg/2 mL injection 125 mg (not administered)  ketorolac (TORADOL) injection 60 mg (60 mg Intramuscular Given 01/03/17 1626)  oxyCODONE-acetaminophen (PERCOCET/ROXICET) 5-325 MG per tablet 1 tablet (1 tablet Oral Given 01/03/17 1626)  ondansetron (ZOFRAN) tablet 4 mg (4 mg Oral Given 01/03/17 1626)  iopamidol (ISOVUE-370) 76 % injection 75 mL (75 mLs Intravenous Contrast Given 01/03/17 2056)  famotidine (PEPCID) IVPB 20 mg premix (20 mg Intravenous New Bag/Given 01/03/17 2247)     Initial Impression  / Assessment and Plan / ED Course  I have reviewed the triage vital signs and the nursing notes.  Pertinent labs & imaging results that were available during my care of the patient were reviewed by me and considered in my medical decision making (see chart for details).    Recheck: Patient continues to have severe pain. She identifies the pain as being in the neck of her neck from slightly to the left and up into the base of her skull. Will proceed with CT angiograms to rule out vascular etiology and other soft tissue anomaly. Patient has pacer and cannot have MRI. Recheck: Patient has had diagnostic studies completed. After treatment with Toradol and Percocet patient continued to have pain with anything that requires her to move. She developed dizziness, nausea and feeling of reflux after being medicated. Pepcid added. Patient's pain has not improved. Patient is willing to try morphine although she has had nausea and vomiting with it years ago.  Final Clinical Impressions(s) / ED Diagnoses   Final diagnoses:  Intractable pain  Spondylolisthesis of cervical region   Diagnostic evaluation has ruled out for vertebrobasilar dissection. Patient cannot undergo MRI due to pacer. CT does not show other ominous soft tissue anomalies, intracerebral hemorrhage or suspicious area for CVA. Patient's pain has been severe and recurrent. With any attempted activity patient is incapacitated by pain despite treatment with toradol and percocet. . In the process of treating for pain, patient has become vertiginous and nauseated complicating pain management. Patient now also complains of reflux symptoms. Have added Pepcid IV and Solu-Medrol. As patient continues to have no improvement in her actual pain will also add small dose of morphine and continue to treat nausea as needed. Plan will be to admit for intractable pain.  New Prescriptions New Prescriptions   No medications on file     Charlesetta Shanks,  MD 01/04/17 [phone]

## 2017-01-03 NOTE — ED Triage Notes (Signed)
Patient reports waking up today with left side neck pain that radiates up to head. Patient took 2 extra strength tylenol earlier and pain still ongoing.

## 2017-01-03 NOTE — ED Notes (Signed)
[name] (Son) [phone] [name] [facility]) [phone]

## 2017-01-03 NOTE — Telephone Encounter (Signed)
Pt submitted medication list for review.  Pt med list updated.  Will continue to monitor.

## 2017-01-03 NOTE — ED Notes (Signed)
Patient's sons took patient's belongings home with them- including patient's purse and billfold, clothes, and shoes. Patient kept her cane, cell phone, and address book at her bedside with her person.

## 2017-01-03 NOTE — Telephone Encounter (Signed)
Walk In Pt Form-Sealed envelope Dropped off pleaced in [facility] Box

## 2017-01-03 NOTE — H&P (Signed)
[name] DSK:[id number] DOB: [birth year] DOA: 01/03/2017     PCP: [name], MD   Outpatient Specialists: Cardiology [facility] Patient coming from:   home Lives alone,      Chief Complaint: Headache/left sided neck pain  HPI: [name] is a 83 y.o. female with medical history significant of h/o symptomatic bradycardia, atrial fibrillation, status post permanent pacemaker insertion, HTN, hyperthyroidism, SIADH    Presented with severe left-sided neck pain since she woke up and progressively getting worse throughout the day not associated neurological deficits patient to 2 Tylenols and attempted massage but did not improve    Regarding pertinent Chronic problems: Followed by cardiology pacemaker check was on 12/29/2016 at that time Dr. Lovena Le recommended to stop triamterene hydrochlorothiazide and increase Lasix to 40 mg daily  Patient recently expressed concern regarding continued to take her Lasix as she read that it can cause hearing loss  IN ER:  Temp (24hrs), Avg:97.7 F (36.5 C), Min:97.6 F (36.4 C), Max:97.8 F (36.6 C)      on arrival  ED Triage Vitals  Enc Vitals Group     BP 01/03/17 1306 (!) 156/88     Pulse Rate 01/03/17 1306 (!) 58     Resp 01/03/17 1306 16     Temp 01/03/17 1306 97.8 F (36.6 C)     Temp Source 01/03/17 1306 Oral     SpO2 01/03/17 1306 98 %     Weight --      Height --      Head Circumference --      Peak Flow --      Pain Score 01/03/17 1316 10     Pain Loc --      Pain Edu? --      Excl. in [place]? --     Latest RR 13 97% HR 60 BP 143/84 Sodium 132 Close to baseline, Cr 1.07  WBC 11.1 around baseline INR 1.82  CT angio of the neck: Is a fragile diverticulum thyroid goiter no acute vascular abnormality, advanced degenerative spondylosis at C5-6 and C6-7  chronic facet degeneration Following Medications were ordered in ER: Medications  iopamidol (ISOVUE-370) 76 % injection (not administered)  morphine 2  MG/ML injection 2 mg (2 mg Intravenous Not Given 01/03/17 2248)  0.9 %  sodium chloride infusion (not administered)  methylPREDNISolone sodium succinate (SOLU-MEDROL) 125 mg/2 mL injection 125 mg (not administered)  ketorolac (TORADOL) injection 60 mg (60 mg Intramuscular Given 01/03/17 1626)  oxyCODONE-acetaminophen (PERCOCET/ROXICET) 5-325 MG per tablet 1 tablet (1 tablet Oral Given 01/03/17 1626)  ondansetron (ZOFRAN) tablet 4 mg (4 mg Oral Given 01/03/17 1626)  iopamidol (ISOVUE-370) 76 % injection 75 mL (75 mLs Intravenous Contrast Given 01/03/17 2056)  famotidine (PEPCID) IVPB 20 mg premix (20 mg Intravenous New Bag/Given 01/03/17 2247)    Hospitalist was called for admission for Intractable neck pain  Review of Systems:    Pertinent positives include: Neck pain  Constitutional:  No weight loss, night sweats, Fevers, chills, fatigue, weight loss  HEENT:  No headaches, Difficulty swallowing,Tooth/dental problems,Sore throat,  No sneezing, itching, ear ache, nasal congestion, post nasal drip,  Cardio-vascular:  No chest pain, Orthopnea, PND, anasarca, dizziness, palpitations.no Bilateral lower extremity swelling  GI:  No heartburn, indigestion, abdominal pain, nausea, vomiting, diarrhea, change in bowel habits, loss of appetite, melena, blood in stool, hematemesis Resp:  no shortness of breath at rest. No dyspnea on exertion, No excess mucus, no productive cough, No non-productive  cough, No coughing up of blood.No change in color of mucus.No wheezing. Skin:  no rash or lesions. No jaundice GU:  no dysuria, change in color of urine, no urgency or frequency. No straining to urinate.  No flank pain.  Musculoskeletal:  No joint pain or no joint swelling. No decreased range of motion. No back pain.  Psych:  No change in mood or affect. No depression or anxiety. No memory loss.  Neuro: no localizing neurological complaints, no tingling, no weakness, no double vision, no gait abnormality,  no slurred speech, no confusion  As per HPI otherwise 10 point review of systems negative.   Past Medical History: Past Medical History:  Diagnosis Date  . Anxiety   . Atrial fibrillation ([place])   . Bradycardia   . Complication of anesthesia   . Difficulty sleeping   . Dysrhythmia    A FIB  . H/O hiatal hernia   . History of kidney stones   . History of skin cancer   . History of transfusion   . Hypertension   . Osteoarthritis   . Pacemaker   . Pneumonia   . PONV (postoperative nausea and vomiting)   . Shortness of breath    WITH EXERTION   Past Surgical History:  Procedure Laterality Date  . APPENDECTOMY  1960  . ARTHROPLASTY  1994   Left total knee  . ARTHROPLASTY  2000   Total right knee  . BACK SURGERY    . CHOLECYSTECTOMY    . DG SELECTED [facility] ONLY  2002   Dilation  . JOINT REPLACEMENT     knee replacement  . Microdiskectomy  05/2004   Left L4-L5  . PACEMAKER INSERTION    . TOTAL ABDOMINAL HYSTERECTOMY  1969  . TOTAL HIP ARTHROPLASTY Right 12/24/2013   Procedure: RIGHT TOTAL HIP ARTHROPLASTY ANTERIOR APPROACH;  Surgeon: Gearlean Alf, MD;  Location: [facility];  Service: Orthopedics;  Laterality: Right;     Social History:  Ambulatory  Kasandra Knudsen     reports that she quit smoking about 43 years ago. She has quit using smokeless tobacco. She reports that she does not drink alcohol or use drugs.  Allergies:   Allergies  Allergen Reactions  . Morphine And Related Nausea And Vomiting       Family History:   Family History  Problem Relation Age of Onset  . Heart disease Mother   . Leukemia Father     Medications: Prior to Admission medications   Medication Sig Start Date End Date Taking? Authorizing Provider  acetaminophen (TYLENOL) 325 MG tablet Take 650 mg by mouth every 6 (six) hours as needed (pain).   Yes [provider]  amLODipine (NORVASC) 10 MG tablet Take 10 mg by mouth every morning.    Yes [provider]  clonazePAM  (KLONOPIN) 0.5 MG tablet Take 0.5 mg by mouth daily as needed for anxiety.  03/18/15  Yes [provider]  cloNIDine (CATAPRES) 0.1 MG tablet Take 0.1 mg by mouth 2 (two) times daily.    Yes [provider]  irbesartan (AVAPRO) 300 MG tablet Take 300 mg by mouth every morning.   Yes [provider]  methimazole (TAPAZOLE) 5 MG tablet TAKE ONE TABLET BY MOUTH DAILY WITH FOOD 10/28/15  Yes [provider]  pantoprazole (PROTONIX) 40 MG tablet Take 1 tablet (40 mg total) by mouth daily. 06/16/16  Yes Elgergawy, Silver Huguenin, MD  potassium chloride SA (K-DUR,KLOR-CON) 20 MEQ tablet Take 20 mEq by  mouth every morning.    Yes [provider]  pravastatin (PRAVACHOL) 40 MG tablet Take 40 mg by mouth every morning.    Yes [provider]  PREVIDENT 5000 PLUS 1.1 % CREA dental cream Take 1 application by mouth at bedtime. As directed 05/25/16  Yes [provider]  triamterene-hydrochlorothiazide (MAXZIDE-25) 37.5-25 MG tablet Take 1 tablet by mouth daily.   Yes [provider]  warfarin (COUMADIN) 2 MG tablet Take 1-2 tablets (2-4 mg total) by mouth as directed. Take Coumadin for three weeks for postoperative protocol and then the patient may resume their previous Coumadin home regimen.  The dose may need to be adjusted based upon the INR.  Please follow the INR and titrate Coumadin dose for a therapeutic range between 2.0 and 3.0 INR.  After completing the three weeks of Coumadin, the patient may resume their previous Coumadin home regimen. Patient taking differently: Take 2-4 mg by mouth daily. Take 2mg  by mouth Sunday, Tuesday, Wednesday, Friday, and saturday. Take 4mg  by mouth on Monday and Thursday. 12/27/13  Yes Perkins, Alexzandrew L, PA-C  furosemide (LASIX) 20 MG tablet Take 2 tablets (40 mg total) by mouth daily. Patient not taking: Reported on 01/03/2017 12/30/16 03/30/17  Evans Lance, MD  sucralfate (CARAFATE) 1 g tablet Take 1 tablet (1  g total) by mouth 4 (four) times daily -  with meals and at bedtime. 06/15/16 07/15/16  Elgergawy, Silver Huguenin, MD    Physical Exam: Patient Vitals for the past 24 hrs:  BP Temp Temp src Pulse Resp SpO2  01/03/17 2230 (!) 143/84 - - 60 13 97 %  01/03/17 2200 (!) 160/73 - - (!) 59 15 96 %  01/03/17 2130 (!) 162/70 - - 60 17 95 %  01/03/17 2030 (!) 158/81 - - (!) 59 20 97 %  01/03/17 2000 (!) 174/70 - - (!) 59 15 97 %  01/03/17 1953 (!) 162/70 97.6 F (36.4 C) Oral 60 17 96 %  01/03/17 1900 (!) 167/73 - - (!) 59 - 99 %  01/03/17 1800 128/67 - - (!) 59 - 91 %  01/03/17 1631 (!) 174/77 - - 60 - 100 %  01/03/17 1610 (!) 181/82 - - 68 - 100 %  01/03/17 1607 (!) 181/82 - - 75 18 100 %  01/03/17 1306 (!) 156/88 97.8 F (36.6 C) Oral (!) 58 16 98 %    1. General:  in No Acute distress  well -appearing 2. Psychological: Alert and Oriented 3. Head/ENT:     Dry Mucous Membranes                          Head Non traumatic, neck supple                            Poor Dentition 4. SKIN:   decreased Skin turgor,  Skin clean Dry and intact no rash 5. Heart: Regular rate and rhythm no  Murmur, no Rub or gallop 6. Lungs:  no wheezes or crackles   7. Abdomen: Soft, non-tender, Non distended  obese bowel sounds present 8. Lower extremities: no clubbing, cyanosis, or edema 9. Neurologically   strength 5 out of 5 in all 4 extremities cranial nerves II through XII intact 10. MSK: Normal range of motion Neck pain reproducible with palpation and left side at the base of the skull  body mass index is unknown because  there is no height or weight on file.  Labs on Admission:   Labs on Admission: I have personally reviewed following labs and imaging studies  CBC:  Recent Labs Lab 01/03/17 2003  WBC 11.1*  NEUTROABS 9.4*  HGB 12.3  HCT 36.8  MCV 88.2  PLT 540   Basic Metabolic Panel:  Recent Labs Lab 01/03/17 2003  NA 132*  K 4.9  CL 94*  CO2 26  GLUCOSE 117*  BUN 17  CREATININE 1.07*    CALCIUM 9.3   GFR: CrCl cannot be calculated (Unknown ideal weight.). Liver Function Tests:  Recent Labs Lab 01/03/17 2003  AST 42*  ALT 12*  ALKPHOS 96  BILITOT 1.7*  PROT 7.7  ALBUMIN 4.4   No results for input(s): LIPASE, AMYLASE in the last 168 hours. No results for input(s): AMMONIA in the last 168 hours. Coagulation Profile:  Recent Labs Lab 01/03/17 2003  INR 1.82   Cardiac Enzymes: No results for input(s): CKTOTAL, CKMB, CKMBINDEX, TROPONINI in the last 168 hours. BNP (last 3 results) No results for input(s): PROBNP in the last 8760 hours. HbA1C: No results for input(s): HGBA1C in the last 72 hours. CBG: No results for input(s): GLUCAP in the last 168 hours. Lipid Profile: No results for input(s): CHOL, HDL, LDLCALC, TRIG, CHOLHDL, LDLDIRECT in the last 72 hours. Thyroid Function Tests: No results for input(s): TSH, T4TOTAL, FREET4, T3FREE, THYROIDAB in the last 72 hours. Anemia Panel: No results for input(s): VITAMINB12, FOLATE, FERRITIN, TIBC, IRON, RETICCTPCT in the last 72 hours. Urine analysis:    Component Value Date/Time   COLORURINE YELLOW 05/13/2015 2000   APPEARANCEUR CLEAR 05/13/2015 2000   LABSPEC 1.008 05/13/2015 2000   PHURINE 7.5 05/13/2015 2000   GLUCOSEU NEGATIVE 05/13/2015 2000   HGBUR NEGATIVE 05/13/2015 2000   BILIRUBINUR NEGATIVE 05/13/2015 2000   KETONESUR NEGATIVE 05/13/2015 2000   PROTEINUR NEGATIVE 05/13/2015 2000   UROBILINOGEN 0.2 11/12/2014 1331   NITRITE NEGATIVE 05/13/2015 2000   LEUKOCYTESUR SMALL (A) 05/13/2015 2000   Sepsis Labs: @LABRCNTIP (procalcitonin:4,lacticidven:4) )No results found for this or any previous visit (from the past 240 hour(s)).    UA  not ordered  No results found for: HGBA1C  CrCl cannot be calculated (Unknown ideal weight.).  BNP (last 3 results) No results for input(s): PROBNP in the last 8760 hours.   ECG REPORT  Independently reviewed Rate: 60  Rhythm: Paced ST&T Change:   NA QTC 480  There were no vitals filed for this visit.   Cultures: No results found for: SDES, Boyd, CULT, REPTSTATUS   Radiological Exams on Admission: Ct Angio Head W Or Wo Contrast  Result Date: 01/03/2017 CLINICAL DATA:  Initial evaluation for left-sided neck pain. EXAM: CT ANGIOGRAPHY HEAD AND NECK TECHNIQUE: Multidetector CT imaging of the head and neck was performed using the standard protocol during bolus administration of intravenous contrast. Multiplanar CT image reconstructions and MIPs were obtained to evaluate the vascular anatomy. Carotid stenosis measurements (when applicable) are obtained utilizing NASCET criteria, using the distal internal carotid diameter as the denominator. CONTRAST:  75 cc of Isovue 370. COMPARISON:  Prior CT from 09/24/2015. FINDINGS: CT HEAD FINDINGS Brain: Age-related cerebral atrophy with mild chronic small vessel ischemic disease. No acute intracranial hemorrhage. No evidence for acute large vessel territory infarct. No mass lesion, midline shift or mass effect. No hydrocephalus. No extra-axial fluid collection. Vascular: No hyperdense vessel. Scattered vascular calcifications noted within the carotid siphons. Skull: Scalp soft tissues and calvarium within normal limits. Sinuses:  Paranasal sinuses and mastoid air cells are clear. Orbits: Globes and orbital soft tissues within normal limits. Review of the MIP images confirms the above findings CTA NECK FINDINGS Aortic arch: Visualized aortic arch of normal caliber with normal 3 vessel morphology. Mild scattered plaque within the arch itself and about the origin of the great vessels without flow-limiting stenosis. Visualized subclavian artery is widely patent. Right carotid system: Right common carotid artery patent from its origin to the bifurcation without stenosis. Mild a centric calcified plaque about the right bifurcation without stenosis. Right ICA patent distally to the skullbase without stenosis,  dissection, or occlusion. Left carotid system: Left common carotid artery patent from its origin to the bifurcation without stenosis. A centric calcified plaque about the left bifurcation without flow-limiting stenosis. Left ICA patent distally to the skullbase without stenosis, dissection, or occlusion. Vertebral arteries: Both of the vertebral arteries arise from the subclavian arteries. Vertebral arteries widely patent within the neck without stenosis, dissection, or occlusion. Skeleton: No acute osseus abnormality. No worrisome lytic or blastic osseous lesions. Grade 1 anterolisthesis of C4 on C5, likely due to chronic facet degeneration. A advanced degenerative spondylolysis present at C5-6 and C6-7. Other neck: Soft tissues of the neck demonstrate no acute abnormality. Salivary glands within normal limits. Enlarged thyroid cord or noted. 1.4 x 1.7 cm focal outpouching containing air and fluid extends from the left lateral aspect of the upper esophagus, consistent with an esophageal diverticulum. Intraluminal fluid density present distally within the visualized esophageal lumen. Upper chest: Visualized upper chest demonstrates no acute abnormality. Partially visualized lungs are grossly clear. Review of the MIP images confirms the above findings CTA HEAD FINDINGS Anterior circulation: Petrous segments widely patent bilaterally. Scattered calcified atheromatous plaque within the cavernous/ supraclinoid ICAs without flow-limiting stenosis. ICA termini widely patent. A1 segments patent bilaterally. Anterior communicating artery normal. Anterior cerebral arteries patent to their distal aspects without stenosis. M1 segments patent without stenosis or occlusion. Normal MCA bifurcations. No proximal M2 occlusion. Distal MCA branches well perfused and symmetric. Posterior circulation: Focal non stenotic plaque noted within the left V4 segment as it crosses into the cranial vault. Vertebral arteries otherwise widely  patent to the vertebrobasilar junction. Posterior inferior cerebral arteries patent bilaterally. Basilar artery widely patent to its distal aspect. Superior cerebral arteries patent bilaterally. Posterior cerebral arteries well perfused and widely patent to their distal aspects without stenosis. Prominent right posterior communicating artery noted. Venous sinuses: Patent. Anatomic variants: None significant. No aneurysm or vascular malformation. Delayed phase: No abnormal enhancement. Review of the MIP images confirms the above findings IMPRESSION: 1. No acute vascular abnormality involving the major arterial vasculature of the head and neck. 2. Mild atheromatous disease for patient age without flow-limiting stenosis. 3. Thyroid goiter. 4. Esophageal diverticulum Electronically Signed   By: Jeannine Boga M.D.   On: 01/03/2017 22:17   Dg Cervical Spine 2-3 Views  Result Date: 01/03/2017 CLINICAL DATA:  Awakened this morning EXAM: CERVICAL SPINE - 2-3 VIEW COMPARISON:  Coronal and sagittal reconstructed images from a head and neck CT scan dated 01/24/2016. FINDINGS: The cervical vertebral bodies are preserved in height. There is 4 mm of anterolisthesis of C4 with respect to C5. There is moderate degenerative disc space narrowing at C5-6 and C6-7 with large anterior endplate osteophytes. There is multilevel facet joint hypertrophy but no perched facet is observed. The spinous processes are intact. The odontoid is intact where visualized. The prevertebral soft tissue spaces are normal. IMPRESSION: Degenerative disc disease centered at  C5-6 and C6-7. Grade 1 anterolisthesis of C4 with respect to C5 likely on the basis of facet joint degenerative change. No compression fracture. Electronically Signed   By: David  Martinique M.D.   On: 01/03/2017 17:13   Ct Angio Neck W And/or Wo Contrast  Result Date: 01/03/2017 CLINICAL DATA:  Initial evaluation for left-sided neck pain. EXAM: CT ANGIOGRAPHY HEAD AND NECK  TECHNIQUE: Multidetector CT imaging of the head and neck was performed using the standard protocol during bolus administration of intravenous contrast. Multiplanar CT image reconstructions and MIPs were obtained to evaluate the vascular anatomy. Carotid stenosis measurements (when applicable) are obtained utilizing NASCET criteria, using the distal internal carotid diameter as the denominator. CONTRAST:  75 cc of Isovue 370. COMPARISON:  Prior CT from 09/24/2015. FINDINGS: CT HEAD FINDINGS Brain: Age-related cerebral atrophy with mild chronic small vessel ischemic disease. No acute intracranial hemorrhage. No evidence for acute large vessel territory infarct. No mass lesion, midline shift or mass effect. No hydrocephalus. No extra-axial fluid collection. Vascular: No hyperdense vessel. Scattered vascular calcifications noted within the carotid siphons. Skull: Scalp soft tissues and calvarium within normal limits. Sinuses: Paranasal sinuses and mastoid air cells are clear. Orbits: Globes and orbital soft tissues within normal limits. Review of the MIP images confirms the above findings CTA NECK FINDINGS Aortic arch: Visualized aortic arch of normal caliber with normal 3 vessel morphology. Mild scattered plaque within the arch itself and about the origin of the great vessels without flow-limiting stenosis. Visualized subclavian artery is widely patent. Right carotid system: Right common carotid artery patent from its origin to the bifurcation without stenosis. Mild a centric calcified plaque about the right bifurcation without stenosis. Right ICA patent distally to the skullbase without stenosis, dissection, or occlusion. Left carotid system: Left common carotid artery patent from its origin to the bifurcation without stenosis. A centric calcified plaque about the left bifurcation without flow-limiting stenosis. Left ICA patent distally to the skullbase without stenosis, dissection, or occlusion. Vertebral arteries:  Both of the vertebral arteries arise from the subclavian arteries. Vertebral arteries widely patent within the neck without stenosis, dissection, or occlusion. Skeleton: No acute osseus abnormality. No worrisome lytic or blastic osseous lesions. Grade 1 anterolisthesis of C4 on C5, likely due to chronic facet degeneration. A advanced degenerative spondylolysis present at C5-6 and C6-7. Other neck: Soft tissues of the neck demonstrate no acute abnormality. Salivary glands within normal limits. Enlarged thyroid cord or noted. 1.4 x 1.7 cm focal outpouching containing air and fluid extends from the left lateral aspect of the upper esophagus, consistent with an esophageal diverticulum. Intraluminal fluid density present distally within the visualized esophageal lumen. Upper chest: Visualized upper chest demonstrates no acute abnormality. Partially visualized lungs are grossly clear. Review of the MIP images confirms the above findings CTA HEAD FINDINGS Anterior circulation: Petrous segments widely patent bilaterally. Scattered calcified atheromatous plaque within the cavernous/ supraclinoid ICAs without flow-limiting stenosis. ICA termini widely patent. A1 segments patent bilaterally. Anterior communicating artery normal. Anterior cerebral arteries patent to their distal aspects without stenosis. M1 segments patent without stenosis or occlusion. Normal MCA bifurcations. No proximal M2 occlusion. Distal MCA branches well perfused and symmetric. Posterior circulation: Focal non stenotic plaque noted within the left V4 segment as it crosses into the cranial vault. Vertebral arteries otherwise widely patent to the vertebrobasilar junction. Posterior inferior cerebral arteries patent bilaterally. Basilar artery widely patent to its distal aspect. Superior cerebral arteries patent bilaterally. Posterior cerebral arteries well perfused and widely patent to  their distal aspects without stenosis. Prominent right posterior  communicating artery noted. Venous sinuses: Patent. Anatomic variants: None significant. No aneurysm or vascular malformation. Delayed phase: No abnormal enhancement. Review of the MIP images confirms the above findings IMPRESSION: 1. No acute vascular abnormality involving the major arterial vasculature of the head and neck. 2. Mild atheromatous disease for patient age without flow-limiting stenosis. 3. Thyroid goiter. 4. Esophageal diverticulum Electronically Signed   By: Jeannine Boga M.D.   On: 01/03/2017 22:17    Chart has been reviewed    Assessment/Plan   82 y.o. female with medical history significant of h/o symptomatic bradycardia, atrial fibrillation, status post permanent pacemaker insertion, HTN,   Admitted for intractable neck pain  Present on Admission:  . Cardiac pacemaker in situ stable patient not a candidate for MRI . Essential hypertension stable restart home medications . GERD stable continue home medications . Neck pain  Intractable pain - CT neck and hand as well as CTA showing no evidence of bleed or vasculopathy. Neurologically intact pain reproducible by palpation the base of the skull although this is fairly reassuring. Treat symptomatically with muscle relaxants for now to see if that would help. There is a possibility of nerve compression unable to obtain MRI ER has ordered Solu-Medrol to see if that would help patient states that in the past she had had to have injections in her neck secondary to disc disease but this pain seems to worsen prior no evidence of infection . ATRIAL FIBRILLATION status post pacemaker currently rate controlled on chronic anticoagulation with Coumadin INR 1.8 will continue Other plan as per orders.  DVT prophylaxis: coumadin    Code Status:  FULL CODE  as per patient    Family Communication:   Family not at  Bedside    Disposition Plan:      To home once workup is complete and patient is stable                         Would  benefit from PT/OT eval prior to DC   ordered   Consults called: none  Admission status:   obs   Level of care         medical floor      I have spent a total of 56 min on this admission    Sarya Linenberger 01/04/2017, 1:14 AM    [facility]  Pager [phone]   after 2 AM please page floor coverage PA If 7AM-7PM, please contact the day team taking care of the patient  [email or web address]  Password [email or web address]

## 2017-01-04 ENCOUNTER — Encounter (HOSPITAL_COMMUNITY): Payer: Self-pay | Admitting: Internal Medicine

## 2017-01-04 DIAGNOSIS — R52 Pain, unspecified: Secondary | ICD-10-CM | POA: Diagnosis present

## 2017-01-04 DIAGNOSIS — I480 Paroxysmal atrial fibrillation: Secondary | ICD-10-CM | POA: Diagnosis not present

## 2017-01-04 DIAGNOSIS — Z950 Presence of cardiac pacemaker: Secondary | ICD-10-CM | POA: Diagnosis not present

## 2017-01-04 DIAGNOSIS — I4891 Unspecified atrial fibrillation: Secondary | ICD-10-CM | POA: Diagnosis not present

## 2017-01-04 DIAGNOSIS — Z7901 Long term (current) use of anticoagulants: Secondary | ICD-10-CM | POA: Diagnosis not present

## 2017-01-04 DIAGNOSIS — M4312 Spondylolisthesis, cervical region: Secondary | ICD-10-CM | POA: Diagnosis not present

## 2017-01-04 DIAGNOSIS — M542 Cervicalgia: Secondary | ICD-10-CM | POA: Diagnosis not present

## 2017-01-04 LAB — COMPREHENSIVE METABOLIC PANEL
ALT: 14 U/L (ref 14–54)
AST: 21 U/L (ref 15–41)
Albumin: 4 g/dL (ref 3.5–5.0)
Alkaline Phosphatase: 94 U/L (ref 38–126)
Anion gap: 10 (ref 5–15)
BUN: 17 mg/dL (ref 6–20)
CHLORIDE: 98 mmol/L — AB (ref 101–111)
CO2: 24 mmol/L (ref 22–32)
Calcium: 8.9 mg/dL (ref 8.9–10.3)
Creatinine, Ser: 0.99 mg/dL (ref 0.44–1.00)
GFR calc Af Amer: 57 mL/min — ABNORMAL LOW (ref >60)
GFR, EST NON AFRICAN AMERICAN: 49 mL/min — AB (ref >60)
Glucose, Bld: 137 mg/dL — ABNORMAL HIGH (ref 65–99)
POTASSIUM: 4 mmol/L (ref 3.5–5.1)
SODIUM: 132 mmol/L — AB (ref 135–145)
Total Bilirubin: 0.8 mg/dL (ref 0.3–1.2)
Total Protein: 7.1 g/dL (ref 6.5–8.1)

## 2017-01-04 LAB — MAGNESIUM: Magnesium: 1.7 mg/dL (ref 1.7–2.4)

## 2017-01-04 LAB — PROTIME-INR
INR: 2.81
Prothrombin Time: 29.3 seconds — ABNORMAL HIGH (ref 11.4–15.2)

## 2017-01-04 LAB — CBC
HEMATOCRIT: 34.5 % — AB (ref 36.0–46.0)
Hemoglobin: 11.6 g/dL — ABNORMAL LOW (ref 12.0–15.0)
MCH: 29.7 pg (ref 26.0–34.0)
MCHC: 33.6 g/dL (ref 30.0–36.0)
MCV: 88.2 fL (ref 78.0–100.0)
Platelets: 265 10*3/uL (ref 150–400)
RBC: 3.91 MIL/uL (ref 3.87–5.11)
RDW: 14.7 % (ref 11.5–15.5)
WBC: 10.1 10*3/uL (ref 4.0–10.5)

## 2017-01-04 LAB — TSH: TSH: 2.102 u[IU]/mL (ref 0.350–4.500)

## 2017-01-04 LAB — PHOSPHORUS: PHOSPHORUS: 2.7 mg/dL (ref 2.5–4.6)

## 2017-01-04 MED ORDER — ACETAMINOPHEN 325 MG PO TABS
650.0000 mg | ORAL_TABLET | Freq: Four times a day (QID) | ORAL | Status: DC | PRN
  Administered 2017-01-04: 650 mg via ORAL
  Filled 2017-01-04: qty 2

## 2017-01-04 MED ORDER — ONDANSETRON HCL 4 MG PO TABS: 4.0000 mg | ORAL_TABLET | Freq: Four times a day (QID) | ORAL | Status: DC | PRN

## 2017-01-04 MED ORDER — FUROSEMIDE 20 MG PO TABS: 40.0000 mg | ORAL_TABLET | Freq: Every day | ORAL | 0 refills | Status: DC

## 2017-01-04 MED ORDER — PRAVASTATIN SODIUM 40 MG PO TABS
40.0000 mg | ORAL_TABLET | Freq: Every morning | ORAL | Status: DC
  Administered 2017-01-04: 40 mg via ORAL
  Filled 2017-01-04: qty 1

## 2017-01-04 MED ORDER — PREDNISONE 10 MG PO TABS: ORAL_TABLET | ORAL | 0 refills | Status: DC

## 2017-01-04 MED ORDER — CLONAZEPAM 0.5 MG PO TABS: 0.5000 mg | ORAL_TABLET | Freq: Every day | ORAL | Status: DC

## 2017-01-04 MED ORDER — ONDANSETRON HCL 4 MG/2ML IJ SOLN: 4.0000 mg | Freq: Four times a day (QID) | INTRAMUSCULAR | Status: DC | PRN

## 2017-01-04 MED ORDER — PANTOPRAZOLE SODIUM 40 MG PO TBEC
40.0000 mg | DELAYED_RELEASE_TABLET | Freq: Every day | ORAL | Status: DC
  Administered 2017-01-04: 40 mg via ORAL
  Filled 2017-01-04: qty 1

## 2017-01-04 MED ORDER — CYCLOBENZAPRINE HCL 5 MG PO TABS: 5.0000 mg | ORAL_TABLET | Freq: Three times a day (TID) | ORAL | 0 refills | Status: DC | PRN

## 2017-01-04 MED ORDER — NAPROXEN 375 MG PO TABS
375.0000 mg | ORAL_TABLET | Freq: Once | ORAL | Status: AC
  Administered 2017-01-04: 375 mg via ORAL
  Filled 2017-01-04: qty 1

## 2017-01-04 MED ORDER — IRBESARTAN 150 MG PO TABS
300.0000 mg | ORAL_TABLET | Freq: Every morning | ORAL | Status: DC
  Administered 2017-01-04: 300 mg via ORAL
  Filled 2017-01-04: qty 2

## 2017-01-04 MED ORDER — SUCRALFATE 1 G PO TABS
1.0000 g | ORAL_TABLET | Freq: Three times a day (TID) | ORAL | Status: DC
  Administered 2017-01-04: 1 g via ORAL
  Filled 2017-01-04: qty 1

## 2017-01-04 MED ORDER — AMLODIPINE BESYLATE 10 MG PO TABS
10.0000 mg | ORAL_TABLET | Freq: Every morning | ORAL | Status: DC
  Administered 2017-01-04: 10 mg via ORAL
  Filled 2017-01-04: qty 1

## 2017-01-04 MED ORDER — CYCLOBENZAPRINE HCL 5 MG PO TABS
5.0000 mg | ORAL_TABLET | Freq: Three times a day (TID) | ORAL | Status: DC | PRN
  Administered 2017-01-04: 5 mg via ORAL
  Filled 2017-01-04: qty 1

## 2017-01-04 MED ORDER — WARFARIN - PHARMACIST DOSING INPATIENT: Freq: Every day | Status: DC

## 2017-01-04 MED ORDER — WARFARIN SODIUM 2 MG PO TABS
2.0000 mg | ORAL_TABLET | Freq: Once | ORAL | Status: DC
  Filled 2017-01-04: qty 1

## 2017-01-04 MED ORDER — ACETAMINOPHEN 650 MG RE SUPP: 650.0000 mg | Freq: Four times a day (QID) | RECTAL | Status: DC | PRN

## 2017-01-04 MED ORDER — METHIMAZOLE 5 MG PO TABS
5.0000 mg | ORAL_TABLET | Freq: Every day | ORAL | Status: DC
  Administered 2017-01-04: 5 mg via ORAL
  Filled 2017-01-04: qty 1

## 2017-01-04 MED ORDER — CLONIDINE HCL 0.1 MG PO TABS
0.1000 mg | ORAL_TABLET | Freq: Two times a day (BID) | ORAL | Status: DC
  Administered 2017-01-04: 0.1 mg via ORAL
  Filled 2017-01-04: qty 1

## 2017-01-04 MED ORDER — METHOCARBAMOL 1000 MG/10ML IJ SOLN
500.0000 mg | Freq: Three times a day (TID) | INTRAVENOUS | Status: DC | PRN
  Administered 2017-01-04: 500 mg via INTRAVENOUS
  Filled 2017-01-04: qty 550
  Filled 2017-01-04: qty 5

## 2017-01-04 MED ORDER — WARFARIN SODIUM 4 MG PO TABS
4.0000 mg | ORAL_TABLET | Freq: Once | ORAL | Status: DC
  Filled 2017-01-04: qty 1

## 2017-01-04 NOTE — Progress Notes (Signed)
ANTICOAGULATION CONSULT NOTE - Initial Consult  Pharmacy Consult for warfarin Indication: atrial fibrillation  Allergies  Allergen Reactions  . Morphine And Related Nausea And Vomiting    Patient Measurements: Height: 5\' 4"  (162.6 cm) Weight: 152 lb (68.9 kg) IBW/kg (Calculated) : 54.7 Heparin Dosing Weight:   Vital Signs: Temp: 97.9 F (36.6 C) (10/09 0607) Temp Source: Oral (10/09 0607) BP: 142/57 (10/09 0607) Pulse Rate: 59 (10/09 0607)  Labs:  Recent Labs  01/03/17 2003  HGB 12.3  HCT 36.8  PLT 276  LABPROT 20.9*  INR 1.82  CREATININE 1.07*    Estimated Creatinine Clearance: 34 mL/min (A) (by C-G formula based on SCr of 1.07 mg/dL (H)).   Medical History: Past Medical History:  Diagnosis Date  . Anxiety   . Atrial fibrillation ([place])   . Bradycardia   . Complication of anesthesia   . Difficulty sleeping   . Dysrhythmia    A FIB  . H/O hiatal hernia   . History of kidney stones   . History of skin cancer   . History of transfusion   . Hypertension   . Osteoarthritis   . Pacemaker   . Pneumonia   . PONV (postoperative nausea and vomiting)   . Shortness of breath    WITH EXERTION    Medications:  Prescriptions Prior to Admission  Medication Sig Dispense Refill Last Dose  . acetaminophen (TYLENOL) 325 MG tablet Take 650 mg by mouth every 6 (six) hours as needed (pain).   01/03/2017 at Unknown time  . amLODipine (NORVASC) 10 MG tablet Take 10 mg by mouth every morning.    01/03/2017 at Unknown time  . clonazePAM (KLONOPIN) 0.5 MG tablet Take 0.5 mg by mouth daily as needed for anxiety.    unknown  . cloNIDine (CATAPRES) 0.1 MG tablet Take 0.1 mg by mouth 2 (two) times daily.    01/03/2017 at Unknown time  . irbesartan (AVAPRO) 300 MG tablet Take 300 mg by mouth every morning.   01/03/2017 at Unknown time  . methimazole (TAPAZOLE) 5 MG tablet TAKE ONE TABLET BY MOUTH DAILY WITH FOOD  0 01/03/2017 at Unknown time  . pantoprazole (PROTONIX) 40 MG tablet  Take 1 tablet (40 mg total) by mouth daily. 30 tablet 1 01/03/2017 at Unknown time  . potassium chloride SA (K-DUR,KLOR-CON) 20 MEQ tablet Take 20 mEq by mouth every morning.    01/03/2017 at Unknown time  . pravastatin (PRAVACHOL) 40 MG tablet Take 40 mg by mouth every morning.    01/03/2017 at Unknown time  . PREVIDENT 5000 PLUS 1.1 % CREA dental cream Take 1 application by mouth at bedtime. As directed   Past Week at Unknown time  . triamterene-hydrochlorothiazide (MAXZIDE-25) 37.5-25 MG tablet Take 1 tablet by mouth daily.   01/03/2017 at Unknown time  . warfarin (COUMADIN) 2 MG tablet Take 1-2 tablets (2-4 mg total) by mouth as directed. Take Coumadin for three weeks for postoperative protocol and then the patient may resume their previous Coumadin home regimen.  The dose may need to be adjusted based upon the INR.  Please follow the INR and titrate Coumadin dose for a therapeutic range between 2.0 and 3.0 INR.  After completing the three weeks of Coumadin, the patient may resume their previous Coumadin home regimen. (Patient taking differently: Take 2-4 mg by mouth daily. Take 2mg  by mouth Sunday, Tuesday, Wednesday, Friday, and saturday. Take 4mg  by mouth on Monday and Thursday.) 30 tablet 0 01/03/2017 at 0800  .  furosemide (LASIX) 20 MG tablet Take 2 tablets (40 mg total) by mouth daily. (Patient not taking: Reported on 01/03/2017) 14 tablet 0 Not Taking at Unknown time  . sucralfate (CARAFATE) 1 g tablet Take 1 tablet (1 g total) by mouth 4 (four) times daily -  with meals and at bedtime. 120 tablet 0 Taking    Assessment: Patient with afib and chronic warfarin use.  INR < 2 on admit.  Last dose noted 10/8 AM.  Will wait for 10/9 AM INR before dosing warfarin.  Goal of Therapy:  INR 2-3    Plan:  Follow up with AM INR.  Dose warfarin as needed.  Tyler Deis, Shea Stakes [place] 01/04/2017,6:30 AM

## 2017-01-04 NOTE — ED Notes (Signed)
Patient is aware a urine specimen is needed.

## 2017-01-04 NOTE — Discharge Summary (Signed)
Discharge Summary  [name] ZOX:[id number] DOB: February 11, 1926  PCP: [name], MD  Admit date: 01/03/2017 Discharge date: 01/04/2017  Time spent: >59mins, more than 50% time spent on coordination of care.  Recommendations for Outpatient Follow-up:  1. F/u with PMD within a week  for hospital discharge follow up, repeat cbc/bmp at follow up 2. F/u with ortho to consider steroids injection  3. Home health arranged  Discharge Diagnoses:  Active Hospital Problems   Diagnosis Date Noted  . Intractable pain 01/04/2017  . Chronic anticoagulation 01/03/2017  . Neck pain 01/03/2017  . Cardiac pacemaker in situ 08/29/2008  . ATRIAL FIBRILLATION 08/21/2008  . Essential hypertension 08/21/2008  . GERD 08/21/2008    Resolved Hospital Problems   Diagnosis Date Noted Date Resolved  No resolved problems to display.    Discharge Condition: stable  Diet recommendation: heart healthy  Filed Weights   01/04/17 0231  Weight: 68.9 kg (152 lb)    History of present illness:  PCP: Tisovec, Fransico Him, MD   Outpatient Specialists: Cardiology Crist[facility] Patient coming from:   home Lives alone,      Chief Complaint: Headache/left sided neck pain  HPI: [name] is a 83 y.o. female with medical history significant of h/o symptomatic bradycardia, atrial fibrillation, status post permanent pacemaker insertion, HTN, hyperthyroidism, SIADH    Presented with severe left-sided neck pain since she woke up and progressively getting worse throughout the day not associated neurological deficits patient to 2 Tylenols and attempted massage but did not improve    Regarding pertinent Chronic problems: Followed by cardiology pacemaker check was on 12/29/2016 at that time Dr. Lovena Le recommended to stop triamterene hydrochlorothiazide and increase Lasix to 40 mg daily  Patient recently expressed concern regarding continued to take her Lasix as she read that it can cause hearing  loss  IN ER:  Temp (24hrs), Avg:97.7 F (36.5 C), Min:97.6 F (36.4 C), Max:97.8 F (36.6 C)      on arrival      ED Triage Vitals  Enc Vitals Group     BP 01/03/17 1306 (!) 156/88     Pulse Rate 01/03/17 1306 (!) 58     Resp 01/03/17 1306 16     Temp 01/03/17 1306 97.8 F (36.6 C)     Temp Source 01/03/17 1306 Oral     SpO2 01/03/17 1306 98 %     Weight --      Height --      Head Circumference --      Peak Flow --      Pain Score 01/03/17 1316 10     Pain Loc --      Pain Edu? --      Excl. in [place]? --     Latest RR 13 97% HR 60 BP 143/84 Sodium 132 Close to baseline, Cr 1.07  WBC 11.1 around baseline INR 1.82  CT angio of the neck: Is a fragile diverticulum thyroid goiter no acute vascular abnormality, advanced degenerative spondylosis at C5-6 and C6-7  chronic facet degeneration Following Medications were ordered in ER: Medications  iopamidol (ISOVUE-370) 76 % injection (not administered)  morphine 2 MG/ML injection 2 mg (2 mg Intravenous Not Given 01/03/17 2248)  0.9 %  sodium chloride infusion (not administered)  methylPREDNISolone sodium succinate (SOLU-MEDROL) 125 mg/2 mL injection 125 mg (not administered)  ketorolac (TORADOL) injection 60 mg (60 mg Intramuscular Given 01/03/17 1626)  oxyCODONE-acetaminophen (PERCOCET/ROXICET) 5-325 MG per tablet 1 tablet (1  tablet Oral Given 01/03/17 1626)  ondansetron (ZOFRAN) tablet 4 mg (4 mg Oral Given 01/03/17 1626)  iopamidol (ISOVUE-370) 76 % injection 75 mL (75 mLs Intravenous Contrast Given 01/03/17 2056)  famotidine (PEPCID) IVPB 20 mg premix (20 mg Intravenous New Bag/Given 01/03/17 2247)    Hospitalist was called for admission for Intractable neck pain   Hospital Course:  Active Problems:   Essential hypertension   ATRIAL FIBRILLATION   GERD   Cardiac pacemaker in situ   Chronic anticoagulation   Neck pain   Intractable pain   83 y.o. female with medical history significant of h/o symptomatic  bradycardia, atrial fibrillation, status post permanent pacemaker insertion, HTN,   Admitted for intractable neck pain  Present on Admission:  . Cardiac pacemaker in situ stable patient not a candidate for MRI . Essential hypertension stable restart home medications . GERD stable continue home medications . Neck pain  Intractable pain - CT neck and hand as well as CTA showing no evidence of bleed or vasculopathy. Neurologically intact pain reproducible by palpation the base of the skull although this is fairly reassuring. Treat symptomatically with muscle relaxants for now to see if that would help.  There is a possibility of nerve compression unable to obtain MRI ER has ordered Solu-Medrol to see if that would help patient states that in the past she had had to have injections in her neck secondary to disc disease but this pain seems to worsen prior no evidence of infection, no cervical spine tenderness Her symptom has much improved, she walked with physical therapy with slow but steady gait, she has full range of motion at neck and shoulder, she is discharged home on quick steroids taper , prn flexeril , home health arranged,  She is instructed to follow up with ortho , she reports she received steroids injection to her neck in the past.  . ATRIAL FIBRILLATION status post pacemaker currently rate controlled on chronic anticoagulation with Coumadin INR 1.8 will continue She is recently started on lasix 40mg  daily per cardiology , will continue lasix at discharge. maxzide discontinued at discharge due to patient is on lasix. She is to follow up with cardiology on 10/24.  DVT prophylaxis: coumadin    Code Status:  FULL CODE  as per patient    Family Communication:   Son Dominica Severin updated over the phone prior to discharge.    Disposition Plan:   home with home health    Consults called: none  Procedures:  none    Discharge Exam: BP (!) 142/57 (BP Location: Right Arm)   Pulse (!) 59    Temp 97.9 F (36.6 C) (Oral)   Resp 16   Ht 5\' 4"  (1.626 m)   Wt 68.9 kg (152 lb)   SpO2 94%   BMI 26.09 kg/m   General: NAD, full range of motion neck and shoulder, no cervial spine tenderness,  she does has tenderness on left occipital region  Cardiovascular: paced rhythm Respiratory: CTABL Extremity: no edema  Discharge Instructions You were cared for by a hospitalist during your hospital stay. If you have any questions about your discharge medications or the care you received while you were in the hospital after you are discharged, you can call the unit and asked to speak with the hospitalist on call if the hospitalist that took care of you is not available. Once you are discharged, your primary care physician will handle any further medical issues. Please note that NO REFILLS for any discharge  medications will be authorized once you are discharged, as it is imperative that you return to your primary care physician (or establish a relationship with a primary care physician if you do not have one) for your aftercare needs so that they can reassess your need for medications and monitor your lab values.  Discharge Instructions    Diet - low sodium heart healthy    Complete by:  As directed    Face-to-face encounter (required for Medicare/Medicaid patients)    Complete by:  As directed    I Michaeljohn Biss certify that this patient is under my care and that I, or a nurse practitioner or physician's assistant working with me, had a face-to-face encounter that meets the physician face-to-face encounter requirements with this patient on 01/04/2017. The encounter with the patient was in whole, or in part for the following medical condition(s) which is the primary reason for home health care (List medical condition): FTT   The encounter with the patient was in whole, or in part, for the following medical condition, which is the primary reason for home health care:  FTT   I certify that, based on my  findings, the following services are medically necessary home health services:   Nursing Physical therapy     Reason for Medically Necessary Home Health Services:  Skilled Nursing- Change/Decline in Patient Status   My clinical findings support the need for the above services:  Shortness of breath with activity   Further, I certify that my clinical findings support that this patient is homebound due to:  Pain interferes with ambulation/mobility   Home Health    Complete by:  As directed    To provide the following care/treatments:   PT Powell RN Social work     Increase activity slowly    Complete by:  As directed      Allergies as of 01/04/2017      Reactions   Morphine And Related Nausea And Vomiting      Medication List    STOP taking these medications   sucralfate 1 g tablet Commonly known as:  CARAFATE   triamterene-hydrochlorothiazide 37.5-25 MG tablet Commonly known as:  MAXZIDE-25     TAKE these medications   acetaminophen 325 MG tablet Commonly known as:  TYLENOL Take 650 mg by mouth every 6 (six) hours as needed (pain).   amLODipine 10 MG tablet Commonly known as:  NORVASC Take 10 mg by mouth every morning.   clonazePAM 0.5 MG tablet Commonly known as:  KLONOPIN Take 0.5 mg by mouth daily as needed for anxiety.   cloNIDine 0.1 MG tablet Commonly known as:  CATAPRES Take 0.1 mg by mouth 2 (two) times daily.   cyclobenzaprine 5 MG tablet Commonly known as:  FLEXERIL Take 1 tablet (5 mg total) by mouth 3 (three) times daily as needed for muscle spasms.   furosemide 20 MG tablet Commonly known as:  LASIX Take 2 tablets (40 mg total) by mouth daily.   irbesartan 300 MG tablet Commonly known as:  AVAPRO Take 300 mg by mouth every morning.   methimazole 5 MG tablet Commonly known as:  TAPAZOLE TAKE ONE TABLET BY MOUTH DAILY WITH FOOD   pantoprazole 40 MG tablet Commonly known as:  PROTONIX Take 1 tablet (40 mg total) by mouth daily.    potassium chloride SA 20 MEQ tablet Commonly known as:  K-DUR,KLOR-CON Take 20 mEq by mouth every morning.   pravastatin 40 MG tablet Commonly known as:  PRAVACHOL Take 40 mg by mouth every morning.   predniSONE 10 MG tablet Commonly known as:  DELTASONE Label  & dispense according to the schedule below. 6 Pills PO on day one then, 5 Pills PO on day two, 4 Pills PO on day three, 3Pills PO on day four, 2 Pills PO on day five, 1 Pills PO on day six,  then STOP.  Total of 21 tabs   PREVIDENT 5000 PLUS 1.1 % Crea dental cream Generic drug:  sodium fluoride Take 1 application by mouth at bedtime. As directed   warfarin 2 MG tablet Commonly known as:  COUMADIN Take 1-2 tablets (2-4 mg total) by mouth as directed. Take Coumadin for three weeks for postoperative protocol and then the patient may resume their previous Coumadin home regimen.  The dose may need to be adjusted based upon the INR.  Please follow the INR and titrate Coumadin dose for a therapeutic range between 2.0 and 3.0 INR.  After completing the three weeks of Coumadin, the patient may resume their previous Coumadin home regimen. What changed:  when to take this  additional instructions      Allergies  Allergen Reactions  . Morphine And Related Nausea And Vomiting   Follow-up Information    Tisovec, Fransico Him, MD Follow up in 1 week(s).   Specialty:  Internal Medicine Why:  hosptial discharge follow up Contact information: [place] [address] [phone]        follow up with ortho to consider neck steroids injection. Follow up.            The results of significant diagnostics from this hospitalization (including imaging, microbiology, ancillary and laboratory) are listed below for reference.    Significant Diagnostic Studies: Ct Angio Head W Or Wo Contrast  Result Date: 01/03/2017 CLINICAL DATA:  Initial evaluation for left-sided neck pain. EXAM: CT ANGIOGRAPHY HEAD AND NECK  TECHNIQUE: Multidetector CT imaging of the head and neck was performed using the standard protocol during bolus administration of intravenous contrast. Multiplanar CT image reconstructions and MIPs were obtained to evaluate the vascular anatomy. Carotid stenosis measurements (when applicable) are obtained utilizing NASCET criteria, using the distal internal carotid diameter as the denominator. CONTRAST:  75 cc of Isovue 370. COMPARISON:  Prior CT from 09/24/2015. FINDINGS: CT HEAD FINDINGS Brain: Age-related cerebral atrophy with mild chronic small vessel ischemic disease. No acute intracranial hemorrhage. No evidence for acute large vessel territory infarct. No mass lesion, midline shift or mass effect. No hydrocephalus. No extra-axial fluid collection. Vascular: No hyperdense vessel. Scattered vascular calcifications noted within the carotid siphons. Skull: Scalp soft tissues and calvarium within normal limits. Sinuses: Paranasal sinuses and mastoid air cells are clear. Orbits: Globes and orbital soft tissues within normal limits. Review of the MIP images confirms the above findings CTA NECK FINDINGS Aortic arch: Visualized aortic arch of normal caliber with normal 3 vessel morphology. Mild scattered plaque within the arch itself and about the origin of the great vessels without flow-limiting stenosis. Visualized subclavian artery is widely patent. Right carotid system: Right common carotid artery patent from its origin to the bifurcation without stenosis. Mild a centric calcified plaque about the right bifurcation without stenosis. Right ICA patent distally to the skullbase without stenosis, dissection, or occlusion. Left carotid system: Left common carotid artery patent from its origin to the bifurcation without stenosis. A centric calcified plaque about the left bifurcation without flow-limiting stenosis. Left ICA patent distally to the skullbase without stenosis, dissection, or  occlusion. Vertebral arteries:  Both of the vertebral arteries arise from the subclavian arteries. Vertebral arteries widely patent within the neck without stenosis, dissection, or occlusion. Skeleton: No acute osseus abnormality. No worrisome lytic or blastic osseous lesions. Grade 1 anterolisthesis of C4 on C5, likely due to chronic facet degeneration. A advanced degenerative spondylolysis present at C5-6 and C6-7. Other neck: Soft tissues of the neck demonstrate no acute abnormality. Salivary glands within normal limits. Enlarged thyroid cord or noted. 1.4 x 1.7 cm focal outpouching containing air and fluid extends from the left lateral aspect of the upper esophagus, consistent with an esophageal diverticulum. Intraluminal fluid density present distally within the visualized esophageal lumen. Upper chest: Visualized upper chest demonstrates no acute abnormality. Partially visualized lungs are grossly clear. Review of the MIP images confirms the above findings CTA HEAD FINDINGS Anterior circulation: Petrous segments widely patent bilaterally. Scattered calcified atheromatous plaque within the cavernous/ supraclinoid ICAs without flow-limiting stenosis. ICA termini widely patent. A1 segments patent bilaterally. Anterior communicating artery normal. Anterior cerebral arteries patent to their distal aspects without stenosis. M1 segments patent without stenosis or occlusion. Normal MCA bifurcations. No proximal M2 occlusion. Distal MCA branches well perfused and symmetric. Posterior circulation: Focal non stenotic plaque noted within the left V4 segment as it crosses into the cranial vault. Vertebral arteries otherwise widely patent to the vertebrobasilar junction. Posterior inferior cerebral arteries patent bilaterally. Basilar artery widely patent to its distal aspect. Superior cerebral arteries patent bilaterally. Posterior cerebral arteries well perfused and widely patent to their distal aspects without stenosis. Prominent right posterior  communicating artery noted. Venous sinuses: Patent. Anatomic variants: None significant. No aneurysm or vascular malformation. Delayed phase: No abnormal enhancement. Review of the MIP images confirms the above findings IMPRESSION: 1. No acute vascular abnormality involving the major arterial vasculature of the head and neck. 2. Mild atheromatous disease for patient age without flow-limiting stenosis. 3. Thyroid goiter. 4. Esophageal diverticulum Electronically Signed   By: Jeannine Boga M.D.   On: 01/03/2017 22:17   Dg Cervical Spine 2-3 Views  Result Date: 01/03/2017 CLINICAL DATA:  Awakened this morning EXAM: CERVICAL SPINE - 2-3 VIEW COMPARISON:  Coronal and sagittal reconstructed images from a head and neck CT scan dated 01/24/2016. FINDINGS: The cervical vertebral bodies are preserved in height. There is 4 mm of anterolisthesis of C4 with respect to C5. There is moderate degenerative disc space narrowing at C5-6 and C6-7 with large anterior endplate osteophytes. There is multilevel facet joint hypertrophy but no perched facet is observed. The spinous processes are intact. The odontoid is intact where visualized. The prevertebral soft tissue spaces are normal. IMPRESSION: Degenerative disc disease centered at C5-6 and C6-7. Grade 1 anterolisthesis of C4 with respect to C5 likely on the basis of facet joint degenerative change. No compression fracture. Electronically Signed   By: David  Martinique M.D.   On: 01/03/2017 17:13   Ct Angio Neck W And/or Wo Contrast  Result Date: 01/03/2017 CLINICAL DATA:  Initial evaluation for left-sided neck pain. EXAM: CT ANGIOGRAPHY HEAD AND NECK TECHNIQUE: Multidetector CT imaging of the head and neck was performed using the standard protocol during bolus administration of intravenous contrast. Multiplanar CT image reconstructions and MIPs were obtained to evaluate the vascular anatomy. Carotid stenosis measurements (when applicable) are obtained utilizing NASCET  criteria, using the distal internal carotid diameter as the denominator. CONTRAST:  75 cc of Isovue 370. COMPARISON:  Prior CT from 09/24/2015. FINDINGS: CT HEAD FINDINGS Brain: Age-related cerebral atrophy with mild  chronic small vessel ischemic disease. No acute intracranial hemorrhage. No evidence for acute large vessel territory infarct. No mass lesion, midline shift or mass effect. No hydrocephalus. No extra-axial fluid collection. Vascular: No hyperdense vessel. Scattered vascular calcifications noted within the carotid siphons. Skull: Scalp soft tissues and calvarium within normal limits. Sinuses: Paranasal sinuses and mastoid air cells are clear. Orbits: Globes and orbital soft tissues within normal limits. Review of the MIP images confirms the above findings CTA NECK FINDINGS Aortic arch: Visualized aortic arch of normal caliber with normal 3 vessel morphology. Mild scattered plaque within the arch itself and about the origin of the great vessels without flow-limiting stenosis. Visualized subclavian artery is widely patent. Right carotid system: Right common carotid artery patent from its origin to the bifurcation without stenosis. Mild a centric calcified plaque about the right bifurcation without stenosis. Right ICA patent distally to the skullbase without stenosis, dissection, or occlusion. Left carotid system: Left common carotid artery patent from its origin to the bifurcation without stenosis. A centric calcified plaque about the left bifurcation without flow-limiting stenosis. Left ICA patent distally to the skullbase without stenosis, dissection, or occlusion. Vertebral arteries: Both of the vertebral arteries arise from the subclavian arteries. Vertebral arteries widely patent within the neck without stenosis, dissection, or occlusion. Skeleton: No acute osseus abnormality. No worrisome lytic or blastic osseous lesions. Grade 1 anterolisthesis of C4 on C5, likely due to chronic facet degeneration.  A advanced degenerative spondylolysis present at C5-6 and C6-7. Other neck: Soft tissues of the neck demonstrate no acute abnormality. Salivary glands within normal limits. Enlarged thyroid cord or noted. 1.4 x 1.7 cm focal outpouching containing air and fluid extends from the left lateral aspect of the upper esophagus, consistent with an esophageal diverticulum. Intraluminal fluid density present distally within the visualized esophageal lumen. Upper chest: Visualized upper chest demonstrates no acute abnormality. Partially visualized lungs are grossly clear. Review of the MIP images confirms the above findings CTA HEAD FINDINGS Anterior circulation: Petrous segments widely patent bilaterally. Scattered calcified atheromatous plaque within the cavernous/ supraclinoid ICAs without flow-limiting stenosis. ICA termini widely patent. A1 segments patent bilaterally. Anterior communicating artery normal. Anterior cerebral arteries patent to their distal aspects without stenosis. M1 segments patent without stenosis or occlusion. Normal MCA bifurcations. No proximal M2 occlusion. Distal MCA branches well perfused and symmetric. Posterior circulation: Focal non stenotic plaque noted within the left V4 segment as it crosses into the cranial vault. Vertebral arteries otherwise widely patent to the vertebrobasilar junction. Posterior inferior cerebral arteries patent bilaterally. Basilar artery widely patent to its distal aspect. Superior cerebral arteries patent bilaterally. Posterior cerebral arteries well perfused and widely patent to their distal aspects without stenosis. Prominent right posterior communicating artery noted. Venous sinuses: Patent. Anatomic variants: None significant. No aneurysm or vascular malformation. Delayed phase: No abnormal enhancement. Review of the MIP images confirms the above findings IMPRESSION: 1. No acute vascular abnormality involving the major arterial vasculature of the head and neck. 2.  Mild atheromatous disease for patient age without flow-limiting stenosis. 3. Thyroid goiter. 4. Esophageal diverticulum Electronically Signed   By: Jeannine Boga M.D.   On: 01/03/2017 22:17    Microbiology: No results found for this or any previous visit (from the past 240 hour(s)).   Labs: Basic Metabolic Panel:  Recent Labs Lab 01/03/17 2003 01/04/17 0626  NA 132* 132*  K 4.9 4.0  CL 94* 98*  CO2 26 24  GLUCOSE 117* 137*  BUN 17 17  CREATININE 1.07*  0.99  CALCIUM 9.3 8.9  MG  --  1.7  PHOS  --  2.7   Liver Function Tests:  Recent Labs Lab 01/03/17 2003 01/04/17 0626  AST 42* 21  ALT 12* 14  ALKPHOS 96 94  BILITOT 1.7* 0.8  PROT 7.7 7.1  ALBUMIN 4.4 4.0   No results for input(s): LIPASE, AMYLASE in the last 168 hours. No results for input(s): AMMONIA in the last 168 hours. CBC:  Recent Labs Lab 01/03/17 2003 01/04/17 0626  WBC 11.1* 10.1  NEUTROABS 9.4*  --   HGB 12.3 11.6*  HCT 36.8 34.5*  MCV 88.2 88.2  PLT 276 265   Cardiac Enzymes: No results for input(s): CKTOTAL, CKMB, CKMBINDEX, TROPONINI in the last 168 hours. BNP: BNP (last 3 results)  Recent Labs  06/07/16 2220  BNP 203.9*    ProBNP (last 3 results) No results for input(s): PROBNP in the last 8760 hours.  CBG: No results for input(s): GLUCAP in the last 168 hours.     SignedFlorencia Reasons MD, PhD  [facility] 01/04/2017, 12:27 PM

## 2017-01-04 NOTE — ED Notes (Signed)
Checked with patient again and patient still does not need to urinate. Will ask patient again within the hour.

## 2017-01-04 NOTE — ED Notes (Signed)
Pt is resting and appears comfortable. pt alert and oriented.

## 2017-01-04 NOTE — Evaluation (Signed)
Physical Therapy Evaluation Patient Details Name: [name] MRN: [id number] DOB: [birth year] Today's Date: 01/04/2017   History of Present Illness  83 yo female admitted with Afib, HA, neck pain (L posterior). Hx of bradycardia, pacemaker, Afib, HTN  Clinical Impression  On eval, pt was Min guard assist for mobility. She walked ~115 feet with a straight cane. Mildly unsteady but no overt LOB during session. Pt denied dizziness/lightheadedness. She did report slight increase in neck pain with activity. She is currently using a K Pad at rest. Discussed d/c plan-pt plans to return home. She stated her son and granddaughter live nearby. Encouraged pt to use her RW if/when needed. Recommend HHPT f/u. Will follow during hospital stay.     Follow Up Recommendations Home health PT;Supervision - Intermittent    Equipment Recommendations  None recommended by PT    Recommendations for Other Services       Precautions / Restrictions Precautions Precautions: Fall Restrictions Weight Bearing Restrictions: No      Mobility  Bed Mobility Overal bed mobility: Modified Independent                Transfers Overall transfer level: Needs assistance   Transfers: Sit to/from Stand Sit to Stand: Supervision         General transfer comment: for safety  Ambulation/Gait Ambulation/Gait assistance: Min guard Ambulation Distance (Feet): 115 Feet Assistive device: Straight cane Gait Pattern/deviations: Decreased stride length;Step-through pattern     General Gait Details: close guard for safety. Pt denied dizziness/lightheadedness. Pt did report slight increase in pain with ambulation  Stairs            Wheelchair Mobility    Modified Rankin (Stroke Patients Only)       Balance Overall balance assessment: Needs assistance           Standing balance-Leahy Scale: Fair                               Pertinent Vitals/Pain Pain Assessment: 0-10 Pain  Score: 8  Pain Location: neck 8/10 at rest; slightly higher with activity Pain Descriptors / Indicators: Sore;Aching Pain Intervention(s): Monitored during session;Repositioned    Home Living Family/patient expects to be discharged to:: Private residence Living Arrangements: Alone (son lives next door) Available Help at Discharge: Family Type of Home: House Home Access: Stairs to enter   Technical brewer of Steps: 1 Home Layout: One level Home Equipment: Environmental consultant - 2 wheels;Walker - 4 wheels;Shower seat;Bedside commode;Grab bars - tub/shower;Hand held shower head;Cane - single point      Prior Function Level of Independence: Independent with assistive device(s)               Hand Dominance        Extremity/Trunk Assessment   Upper Extremity Assessment Upper Extremity Assessment: Generalized weakness    Lower Extremity Assessment Lower Extremity Assessment: Generalized weakness    Cervical / Trunk Assessment Cervical / Trunk Assessment: Normal  Communication   Communication: No difficulties  Cognition Arousal/Alertness: Awake/alert Behavior During Therapy: WFL for tasks assessed/performed Overall Cognitive Status: Within Functional Limits for tasks assessed                                        General Comments      Exercises     Assessment/Plan    PT Assessment  Patient needs continued PT services  PT Problem List Decreased strength;Decreased mobility;Decreased activity tolerance;Decreased balance;Pain       PT Treatment Interventions DME instruction;Gait training;Therapeutic activities;Therapeutic exercise;Patient/family education;Balance training;Functional mobility training    PT Goals (Current goals can be found in the Care Plan section)  Acute Rehab PT Goals Patient Stated Goal: less pain PT Goal Formulation: With patient Time For Goal Achievement: 01/18/17 Potential to Achieve Goals: Good    Frequency Min 3X/week    Barriers to discharge        Co-evaluation               AM-PAC PT "6 Clicks" Daily Activity  Outcome Measure Difficulty turning over in bed (including adjusting bedclothes, sheets and blankets)?: None Difficulty moving from lying on back to sitting on the side of the bed? : None Difficulty sitting down on and standing up from a chair with arms (e.g., wheelchair, bedside commode, etc,.)?: A Little Help needed moving to and from a bed to chair (including a wheelchair)?: A Little Help needed walking in hospital room?: A Little Help needed climbing 3-5 steps with a railing? : A Little 6 Click Score: 20    End of Session Equipment Utilized During Treatment: Gait belt Activity Tolerance: Patient limited by pain Patient left: in bed;with call bell/phone within reach   PT Visit Diagnosis: Muscle weakness (generalized) (M62.81);Difficulty in walking, not elsewhere classified (R26.2)    Time: 0998-3382 PT Time Calculation (min) (ACUTE ONLY): 14 min   Charges:   PT Evaluation $PT Eval Low Complexity: 1 Low     PT G Codes:   PT G-Codes **NOT FOR INPATIENT CLASS** Functional Assessment Tool Used: AM-PAC 6 Clicks Basic Mobility;Clinical judgement Functional Limitation: Mobility: Walking and moving around Mobility: Walking and Moving Around Current Status (N0539): At least 1 percent but less than 20 percent impaired, limited or restricted Mobility: Walking and Moving Around Goal Status [phone]): At least 1 percent but less than 20 percent impaired, limited or restricted      Weston Anna, MPT Pager: [phone]

## 2017-01-04 NOTE — Progress Notes (Signed)
Pts IV removed with a clean and dry dressing intact. Pt denies uncontrolled pain at the time of d/c with no s/s of distress noted. Pt taken to the front entrance via wheelchair with nursing staff and family present

## 2017-01-04 NOTE — Progress Notes (Addendum)
ANTICOAGULATION CONSULT NOTE - Initial Consult  Pharmacy Consult for warfarin Indication: atrial fibrillation  Allergies  Allergen Reactions  . Morphine And Related Nausea And Vomiting    Patient Measurements: Height: 5\' 4"  (162.6 cm) Weight: 152 lb (68.9 kg) IBW/kg (Calculated) : 54.7 Heparin Dosing Weight:   Vital Signs: Temp: 97.9 F (36.6 C) (10/09 0607) Temp Source: Oral (10/09 0607) BP: 142/57 (10/09 0607) Pulse Rate: 59 (10/09 0607)  Labs:  Recent Labs  01/03/17 2003 01/04/17 0626  HGB 12.3 11.6*  HCT 36.8 34.5*  PLT 276 265  LABPROT 20.9*  --   INR 1.82  --   CREATININE 1.07*  --     Estimated Creatinine Clearance: 34 mL/min (A) (by C-G formula based on SCr of 1.07 mg/dL (H)).   Medical History: Past Medical History:  Diagnosis Date  . Anxiety   . Atrial fibrillation ([place])   . Bradycardia   . Complication of anesthesia   . Difficulty sleeping   . Dysrhythmia    A FIB  . H/O hiatal hernia   . History of kidney stones   . History of skin cancer   . History of transfusion   . Hypertension   . Osteoarthritis   . Pacemaker   . Pneumonia   . PONV (postoperative nausea and vomiting)   . Shortness of breath    WITH EXERTION    Medications:  Prescriptions Prior to Admission  Medication Sig Dispense Refill Last Dose  . acetaminophen (TYLENOL) 325 MG tablet Take 650 mg by mouth every 6 (six) hours as needed (pain).   01/03/2017 at Unknown time  . amLODipine (NORVASC) 10 MG tablet Take 10 mg by mouth every morning.    01/03/2017 at Unknown time  . clonazePAM (KLONOPIN) 0.5 MG tablet Take 0.5 mg by mouth daily as needed for anxiety.    unknown  . cloNIDine (CATAPRES) 0.1 MG tablet Take 0.1 mg by mouth 2 (two) times daily.    01/03/2017 at Unknown time  . irbesartan (AVAPRO) 300 MG tablet Take 300 mg by mouth every morning.   01/03/2017 at Unknown time  . methimazole (TAPAZOLE) 5 MG tablet TAKE ONE TABLET BY MOUTH DAILY WITH FOOD  0 01/03/2017 at Unknown  time  . pantoprazole (PROTONIX) 40 MG tablet Take 1 tablet (40 mg total) by mouth daily. 30 tablet 1 01/03/2017 at Unknown time  . potassium chloride SA (K-DUR,KLOR-CON) 20 MEQ tablet Take 20 mEq by mouth every morning.    01/03/2017 at Unknown time  . pravastatin (PRAVACHOL) 40 MG tablet Take 40 mg by mouth every morning.    01/03/2017 at Unknown time  . PREVIDENT 5000 PLUS 1.1 % CREA dental cream Take 1 application by mouth at bedtime. As directed   Past Week at Unknown time  . triamterene-hydrochlorothiazide (MAXZIDE-25) 37.5-25 MG tablet Take 1 tablet by mouth daily.   01/03/2017 at Unknown time  . warfarin (COUMADIN) 2 MG tablet Take 1-2 tablets (2-4 mg total) by mouth as directed. Take Coumadin for three weeks for postoperative protocol and then the patient may resume their previous Coumadin home regimen.  The dose may need to be adjusted based upon the INR.  Please follow the INR and titrate Coumadin dose for a therapeutic range between 2.0 and 3.0 INR.  After completing the three weeks of Coumadin, the patient may resume their previous Coumadin home regimen. (Patient taking differently: Take 2-4 mg by mouth daily. Take 2mg  by mouth Sunday, Tuesday, Wednesday, Friday, and saturday. Take 4mg   by mouth on Monday and Thursday.) 30 tablet 0 01/03/2017 at 0800  . furosemide (LASIX) 20 MG tablet Take 2 tablets (40 mg total) by mouth daily. (Patient not taking: Reported on 01/03/2017) 14 tablet 0 Not Taking at Unknown time  . sucralfate (CARAFATE) 1 g tablet Take 1 tablet (1 g total) by mouth 4 (four) times daily -  with meals and at bedtime. 120 tablet 0 Taking    Assessment: 83 yo female on chronic warfarin for hx afib s/p pacemaker who presents with headache and neck pain.  Pharmacy is consulted to continue dosing warfarin on admission. INR subtherapeutic on admit (1.82)  Home dose reported as 2mg  daily except 4mg  on Mon/Thurs - last dose taken 10/8 at 08:00  Today, 01/04/2017  INR 2.81,  therapeutic  CBC: Hgb slightly low 11.6 but better than previous values, Plts wnl  No bleeding documented  No significant drug interactions noted   Heart healthy diet ordered  Goal of Therapy:  INR 2-3  Plan:   Warfarin 2mg  PO x 1 today at 18:00  Daily PT/INR  Peggyann Juba, PharmD, BCPS Pager: [phone] 01/04/2017,7:37 AM

## 2017-01-04 NOTE — Care Management Note (Addendum)
Case Management Note  Patient Details  Name: [name] MRN: [id number] Date of Birth: Jul 13, 1925  Subjective/Objective:                  Neck pain  Action/Plan: Date:  January 04, 2017 Chart reviewed for concurrent status and case management needs.  Will continue to follow patient progress.  Discharge Planning: following for needs  Expected discharge date: [id number]  Velva Harman, BSN, [place], [place]   Expected Discharge Date:                  Expected Discharge Plan:  Home/Self Care  In-House Referral:     Discharge planning Services  CM Consult  Post Acute Care Choice:    Choice offered to:     DME Arranged:    DME Agency:     HH Arranged:    HH Agency:     Status of Service:  In process, will continue to follow  If discussed at Long Length of Stay Meetings, dates discussed:    Additional Comments:  Leeroy Cha, RN 01/04/2017, 8:39 AM

## 2017-01-05 DIAGNOSIS — M542 Cervicalgia: Secondary | ICD-10-CM | POA: Diagnosis not present

## 2017-01-05 DIAGNOSIS — M5030 Other cervical disc degeneration, unspecified cervical region: Secondary | ICD-10-CM | POA: Diagnosis not present

## 2017-01-05 NOTE — Telephone Encounter (Signed)
Outreach made to Pt.  Per Dr. Lovena Le, Pt can continue taking Maxzide until her Dr. appt with him 01/19/2017 at which time he will review her fluid pills and suggest any changes (not furosemide).  Pt indicates understanding, thankful for call.

## 2017-01-06 ENCOUNTER — Inpatient Hospital Stay (HOSPITAL_COMMUNITY)
Admission: EM | Admit: 2017-01-06 | Discharge: 2017-01-10 | DRG: 552 | Disposition: A | Discharge status: Home / Self Care | Payer: Medicare Other | Attending: Internal Medicine | Admitting: Internal Medicine

## 2017-01-06 ENCOUNTER — Encounter (HOSPITAL_COMMUNITY): Payer: Self-pay | Admitting: Emergency Medicine

## 2017-01-06 DIAGNOSIS — Z90710 Acquired absence of both cervix and uterus: Secondary | ICD-10-CM

## 2017-01-06 DIAGNOSIS — Z950 Presence of cardiac pacemaker: Secondary | ICD-10-CM | POA: Diagnosis present

## 2017-01-06 DIAGNOSIS — R51 Headache: Secondary | ICD-10-CM | POA: Diagnosis not present

## 2017-01-06 DIAGNOSIS — F419 Anxiety disorder, unspecified: Secondary | ICD-10-CM | POA: Diagnosis present

## 2017-01-06 DIAGNOSIS — Z79899 Other long term (current) drug therapy: Secondary | ICD-10-CM

## 2017-01-06 DIAGNOSIS — I10 Essential (primary) hypertension: Secondary | ICD-10-CM | POA: Diagnosis not present

## 2017-01-06 DIAGNOSIS — D72829 Elevated white blood cell count, unspecified: Secondary | ICD-10-CM | POA: Diagnosis present

## 2017-01-06 DIAGNOSIS — Z885 Allergy status to narcotic agent status: Secondary | ICD-10-CM

## 2017-01-06 DIAGNOSIS — M542 Cervicalgia: Principal | ICD-10-CM | POA: Diagnosis present

## 2017-01-06 DIAGNOSIS — Z66 Do not resuscitate: Secondary | ICD-10-CM | POA: Diagnosis present

## 2017-01-06 DIAGNOSIS — M489 Spondylopathy, unspecified: Secondary | ICD-10-CM | POA: Diagnosis present

## 2017-01-06 DIAGNOSIS — E876 Hypokalemia: Secondary | ICD-10-CM | POA: Diagnosis present

## 2017-01-06 DIAGNOSIS — R519 Headache, unspecified: Secondary | ICD-10-CM | POA: Diagnosis present

## 2017-01-06 DIAGNOSIS — E871 Hypo-osmolality and hyponatremia: Secondary | ICD-10-CM | POA: Diagnosis not present

## 2017-01-06 DIAGNOSIS — I4891 Unspecified atrial fibrillation: Secondary | ICD-10-CM | POA: Diagnosis not present

## 2017-01-06 DIAGNOSIS — Z96651 Presence of right artificial knee joint: Secondary | ICD-10-CM | POA: Diagnosis present

## 2017-01-06 DIAGNOSIS — E785 Hyperlipidemia, unspecified: Secondary | ICD-10-CM | POA: Diagnosis not present

## 2017-01-06 DIAGNOSIS — K219 Gastro-esophageal reflux disease without esophagitis: Secondary | ICD-10-CM | POA: Diagnosis present

## 2017-01-06 DIAGNOSIS — Z7901 Long term (current) use of anticoagulants: Secondary | ICD-10-CM

## 2017-01-06 DIAGNOSIS — Z96641 Presence of right artificial hip joint: Secondary | ICD-10-CM | POA: Diagnosis not present

## 2017-01-06 DIAGNOSIS — E0500 Thyrotoxicosis with diffuse goiter without thyrotoxic crisis or storm: Secondary | ICD-10-CM | POA: Diagnosis present

## 2017-01-06 DIAGNOSIS — Z85828 Personal history of other malignant neoplasm of skin: Secondary | ICD-10-CM

## 2017-01-06 DIAGNOSIS — Z87891 Personal history of nicotine dependence: Secondary | ICD-10-CM

## 2017-01-06 DIAGNOSIS — R52 Pain, unspecified: Secondary | ICD-10-CM | POA: Diagnosis not present

## 2017-01-06 LAB — CBC WITH DIFFERENTIAL/PLATELET
BASOS ABS: 0 10*3/uL (ref 0.0–0.1)
BASOS PCT: 0 %
Eosinophils Absolute: 0 10*3/uL (ref 0.0–0.7)
Eosinophils Relative: 0 %
HEMATOCRIT: 36.9 % (ref 36.0–46.0)
HEMOGLOBIN: 12.3 g/dL (ref 12.0–15.0)
LYMPHS PCT: 6 %
Lymphs Abs: 0.6 10*3/uL — ABNORMAL LOW (ref 0.7–4.0)
MCH: 29.5 pg (ref 26.0–34.0)
MCHC: 33.3 g/dL (ref 30.0–36.0)
MCV: 88.5 fL (ref 78.0–100.0)
MONO ABS: 0.1 10*3/uL (ref 0.1–1.0)
MONOS PCT: 1 %
NEUTROS ABS: 9.4 10*3/uL — AB (ref 1.7–7.7)
Neutrophils Relative %: 93 %
Platelets: 282 10*3/uL (ref 150–400)
RBC: 4.17 MIL/uL (ref 3.87–5.11)
RDW: 15 % (ref 11.5–15.5)
WBC: 10 10*3/uL (ref 4.0–10.5)

## 2017-01-06 LAB — BASIC METABOLIC PANEL
ANION GAP: 11 (ref 5–15)
BUN: 23 mg/dL — ABNORMAL HIGH (ref 6–20)
CHLORIDE: 97 mmol/L — AB (ref 101–111)
CO2: 26 mmol/L (ref 22–32)
Calcium: 9.2 mg/dL (ref 8.9–10.3)
Creatinine, Ser: 0.96 mg/dL (ref 0.44–1.00)
GFR calc non Af Amer: 51 mL/min — ABNORMAL LOW (ref >60)
GFR, EST AFRICAN AMERICAN: 59 mL/min — AB (ref >60)
GLUCOSE: 143 mg/dL — AB (ref 65–99)
Potassium: 3.9 mmol/L (ref 3.5–5.1)
Sodium: 134 mmol/L — ABNORMAL LOW (ref 135–145)

## 2017-01-06 LAB — PROTIME-INR
INR: 2.8
Prothrombin Time: 29.3 seconds — ABNORMAL HIGH (ref 11.4–15.2)

## 2017-01-06 MED ORDER — SODIUM CHLORIDE 0.9 % IV BOLUS (SEPSIS)
1000.0000 mL | Freq: Once | INTRAVENOUS | Status: AC
  Administered 2017-01-06: 1000 mL via INTRAVENOUS

## 2017-01-06 MED ORDER — ONDANSETRON 4 MG PO TBDP
4.0000 mg | ORAL_TABLET | Freq: Once | ORAL | Status: AC
  Administered 2017-01-06: 4 mg via ORAL
  Filled 2017-01-06: qty 1

## 2017-01-06 MED ORDER — DIPHENHYDRAMINE HCL 50 MG/ML IJ SOLN
25.0000 mg | Freq: Once | INTRAMUSCULAR | Status: AC
  Administered 2017-01-06: 25 mg via INTRAVENOUS
  Filled 2017-01-06: qty 1

## 2017-01-06 MED ORDER — MORPHINE SULFATE (PF) 4 MG/ML IV SOLN
4.0000 mg | Freq: Once | INTRAVENOUS | Status: AC
  Administered 2017-01-06: 4 mg via INTRAVENOUS
  Filled 2017-01-06: qty 1

## 2017-01-06 MED ORDER — PROCHLORPERAZINE EDISYLATE 5 MG/ML IJ SOLN
10.0000 mg | Freq: Once | INTRAMUSCULAR | Status: AC
  Administered 2017-01-06: 10 mg via INTRAVENOUS
  Filled 2017-01-06: qty 2

## 2017-01-06 MED ORDER — IRBESARTAN 300 MG PO TABS
300.0000 mg | ORAL_TABLET | Freq: Every morning | ORAL | Status: DC
  Administered 2017-01-07 – 2017-01-10 (×4): 300 mg via ORAL
  Filled 2017-01-06 (×4): qty 1

## 2017-01-06 MED ORDER — CLONIDINE HCL 0.1 MG PO TABS
0.1000 mg | ORAL_TABLET | Freq: Two times a day (BID) | ORAL | Status: DC
  Administered 2017-01-06 – 2017-01-10 (×8): 0.1 mg via ORAL
  Filled 2017-01-06 (×8): qty 1

## 2017-01-06 MED ORDER — PREDNISONE 20 MG PO TABS
60.0000 mg | ORAL_TABLET | ORAL | Status: AC
  Administered 2017-01-07: 60 mg via ORAL
  Filled 2017-01-06: qty 3

## 2017-01-06 MED ORDER — METHIMAZOLE 5 MG PO TABS
5.0000 mg | ORAL_TABLET | Freq: Every day | ORAL | Status: DC
  Administered 2017-01-07 – 2017-01-10 (×3): 5 mg via ORAL
  Filled 2017-01-06 (×4): qty 1

## 2017-01-06 MED ORDER — POTASSIUM CHLORIDE CRYS ER 20 MEQ PO TBCR
20.0000 meq | EXTENDED_RELEASE_TABLET | Freq: Every morning | ORAL | Status: DC
  Administered 2017-01-07 – 2017-01-10 (×4): 20 meq via ORAL
  Filled 2017-01-06 (×4): qty 1

## 2017-01-06 MED ORDER — WARFARIN SODIUM 4 MG PO TABS: 4.0000 mg | ORAL_TABLET | ORAL | Status: DC

## 2017-01-06 MED ORDER — FUROSEMIDE 40 MG PO TABS
40.0000 mg | ORAL_TABLET | Freq: Every day | ORAL | Status: DC
  Administered 2017-01-07 – 2017-01-10 (×4): 40 mg via ORAL
  Filled 2017-01-06 (×4): qty 1

## 2017-01-06 MED ORDER — WARFARIN SODIUM 4 MG PO TABS
4.0000 mg | ORAL_TABLET | Freq: Once | ORAL | Status: DC
  Filled 2017-01-06: qty 1

## 2017-01-06 MED ORDER — DICLOFENAC SODIUM 1 % TD GEL
2.0000 g | Freq: Four times a day (QID) | TRANSDERMAL | Status: DC
  Administered 2017-01-06 – 2017-01-10 (×13): 2 g via TOPICAL
  Filled 2017-01-06: qty 100

## 2017-01-06 MED ORDER — ACETAMINOPHEN 325 MG PO TABS
650.0000 mg | ORAL_TABLET | Freq: Four times a day (QID) | ORAL | Status: DC
  Administered 2017-01-06 – 2017-01-10 (×14): 650 mg via ORAL
  Filled 2017-01-06 (×16): qty 2

## 2017-01-06 MED ORDER — CLONAZEPAM 0.5 MG PO TABS: 0.5000 mg | ORAL_TABLET | Freq: Every day | ORAL | Status: DC | PRN

## 2017-01-06 MED ORDER — CYCLOBENZAPRINE HCL 5 MG PO TABS
5.0000 mg | ORAL_TABLET | Freq: Three times a day (TID) | ORAL | Status: DC | PRN
  Administered 2017-01-08: 5 mg via ORAL
  Filled 2017-01-06: qty 1

## 2017-01-06 MED ORDER — PREDNISONE 50 MG PO TABS: 50.0000 mg | ORAL_TABLET | Freq: Every day | ORAL | Status: DC

## 2017-01-06 MED ORDER — WARFARIN - PHARMACIST DOSING INPATIENT: Freq: Every day | Status: DC

## 2017-01-06 MED ORDER — AMLODIPINE BESYLATE 10 MG PO TABS
10.0000 mg | ORAL_TABLET | Freq: Every morning | ORAL | Status: DC
  Administered 2017-01-07 – 2017-01-10 (×4): 10 mg via ORAL
  Filled 2017-01-06 (×4): qty 1

## 2017-01-06 MED ORDER — PANTOPRAZOLE SODIUM 40 MG PO TBEC
40.0000 mg | DELAYED_RELEASE_TABLET | Freq: Every day | ORAL | Status: DC
  Administered 2017-01-07 – 2017-01-10 (×4): 40 mg via ORAL
  Filled 2017-01-06 (×4): qty 1

## 2017-01-06 MED ORDER — WARFARIN SODIUM 2 MG PO TABS: 2.0000 mg | ORAL_TABLET | ORAL | Status: DC

## 2017-01-06 MED ORDER — PRAVASTATIN SODIUM 40 MG PO TABS
40.0000 mg | ORAL_TABLET | Freq: Every morning | ORAL | Status: DC
  Administered 2017-01-07 – 2017-01-10 (×4): 40 mg via ORAL
  Filled 2017-01-06 (×4): qty 1

## 2017-01-06 MED ORDER — LIDOCAINE 5 % EX PTCH
1.0000 | MEDICATED_PATCH | CUTANEOUS | Status: DC
  Administered 2017-01-07 – 2017-01-10 (×4): 1 via TRANSDERMAL
  Filled 2017-01-06 (×5): qty 1

## 2017-01-06 NOTE — ED Provider Notes (Signed)
[facility] DEPT Provider Note   CSN: [id number] Arrival date & time: 01/06/17  1329     History   Chief Complaint Chief Complaint  Patient presents with  . Neck Pain  . Headache    HPI [name] is a 83 y.o. female.  The history is provided by the patient, a relative and medical records. No language interpreter was used.  Headache   This is a recurrent problem. The current episode started more than 2 days ago. The problem occurs constantly. The problem has been gradually worsening. The headache is associated with nothing. The pain is located in the occipital region. The quality of the pain is described as dull. The pain is at a severity of 10/10 (50/10). The pain is severe. Radiates to: from neck into head. Pertinent negatives include no fever, no malaise/fatigue, no chest pressure, no near-syncope, no palpitations, no syncope, no shortness of breath, no nausea and no vomiting. She has tried nothing for the symptoms. The treatment provided no relief.    Past Medical History:  Diagnosis Date  . Anxiety   . Atrial fibrillation ([place])   . Bradycardia   . Complication of anesthesia   . Difficulty sleeping   . Dysrhythmia    A FIB  . H/O hiatal hernia   . History of kidney stones   . History of skin cancer   . History of transfusion   . Hypertension   . Osteoarthritis   . Pacemaker   . Pneumonia   . PONV (postoperative nausea and vomiting)   . Shortness of breath    WITH EXERTION    Patient Active Problem List   Diagnosis Date Noted  . Intractable pain 01/04/2017  . Chronic anticoagulation 01/03/2017  . Neck pain 01/03/2017  . Acute bronchitis 06/08/2016  . Fall 09/24/2015  . Dizziness 09/24/2015  . Postoperative anemia due to acute blood loss 12/27/2013  . Postop Transfusion 12/27/2013  . OA (osteoarthritis) of hip 12/24/2013  . Preop cardiovascular exam 09/25/2013  . Cardiac pacemaker in situ 08/29/2008  . Essential hypertension 08/21/2008  .  ATRIAL FIBRILLATION 08/21/2008  . BRADYCARDIA 08/21/2008  . PNEUMONIA 08/21/2008  . GERD 08/21/2008  . NEPHROLITHIASIS 08/21/2008  . OSTEOARTHRITIS 08/21/2008    Past Surgical History:  Procedure Laterality Date  . APPENDECTOMY  1960  . ARTHROPLASTY  1994   Left total knee  . ARTHROPLASTY  2000   Total right knee  . BACK SURGERY    . CHOLECYSTECTOMY    . DG SELECTED [facility] ONLY  2002   Dilation  . JOINT REPLACEMENT     knee replacement  . Microdiskectomy  05/2004   Left L4-L5  . PACEMAKER INSERTION    . TOTAL ABDOMINAL HYSTERECTOMY  1969  . TOTAL HIP ARTHROPLASTY Right 12/24/2013   Procedure: RIGHT TOTAL HIP ARTHROPLASTY ANTERIOR APPROACH;  Surgeon: Gearlean Alf, MD;  Location: [facility] ORS;  Service: Orthopedics;  Laterality: Right;    OB History    No data available       Home Medications    Prior to Admission medications   Medication Sig Start Date End Date Taking? Authorizing Provider  acetaminophen (TYLENOL) 325 MG tablet Take 650 mg by mouth every 6 (six) hours as needed (pain).    [provider]  amLODipine (NORVASC) 10 MG tablet Take 10 mg by mouth every morning.     [provider]  clonazePAM (KLONOPIN) 0.5 MG tablet Take 0.5 mg by mouth daily as needed for  anxiety.  03/18/15   [provider]  cloNIDine (CATAPRES) 0.1 MG tablet Take 0.1 mg by mouth 2 (two) times daily.     [provider]  cyclobenzaprine (FLEXERIL) 5 MG tablet Take 1 tablet (5 mg total) by mouth 3 (three) times daily as needed for muscle spasms. 01/04/17   Florencia Reasons, MD  furosemide (LASIX) 20 MG tablet Take 2 tablets (40 mg total) by mouth daily. 01/04/17 01/18/17  Florencia Reasons, MD  irbesartan (AVAPRO) 300 MG tablet Take 300 mg by mouth every morning.    [provider]  methimazole (TAPAZOLE) 5 MG tablet TAKE ONE TABLET BY MOUTH DAILY WITH FOOD 10/28/15   [provider]  pantoprazole (PROTONIX) 40 MG tablet Take 1 tablet (40 mg total) by mouth  daily. 06/16/16   Elgergawy, Silver Huguenin, MD  potassium chloride SA (K-DUR,KLOR-CON) 20 MEQ tablet Take 20 mEq by mouth every morning.     [provider]  pravastatin (PRAVACHOL) 40 MG tablet Take 40 mg by mouth every morning.     [provider]  predniSONE (DELTASONE) 10 MG tablet Label  & dispense according to the schedule below. 6 Pills PO on day one then, 5 Pills PO on day two, 4 Pills PO on day three, 3Pills PO on day four, 2 Pills PO on day five, 1 Pills PO on day six,  then STOP.  Total of 21 tabs 01/04/17   Florencia Reasons, MD  PREVIDENT 5000 PLUS 1.1 % CREA dental cream Take 1 application by mouth at bedtime. As directed 05/25/16   [provider]  warfarin (COUMADIN) 2 MG tablet Take 1-2 tablets (2-4 mg total) by mouth as directed. Take Coumadin for three weeks for postoperative protocol and then the patient may resume their previous Coumadin home regimen.  The dose may need to be adjusted based upon the INR.  Please follow the INR and titrate Coumadin dose for a therapeutic range between 2.0 and 3.0 INR.  After completing the three weeks of Coumadin, the patient may resume their previous Coumadin home regimen. Patient taking differently: Take 2-4 mg by mouth daily. Take 2mg  by mouth Sunday, Tuesday, Wednesday, Friday, and saturday. Take 4mg  by mouth on Monday and Thursday. 12/27/13   Perkins, Alexzandrew L, PA-C    Family History Family History  Problem Relation Age of Onset  . Heart disease Mother   . Leukemia Father     Social History Social History  Substance Use Topics  . Smoking status: Former Smoker    Quit date: 12/18/1973  . Smokeless tobacco: Former Systems developer     Comment: Smoker for 25 years and quit at around age 41  . Alcohol use No     Allergies   Morphine and related   Review of Systems Review of Systems  Constitutional: Negative for chills, fatigue, fever and malaise/fatigue.  HENT: Negative for congestion and rhinorrhea.   Eyes: Negative for  photophobia and visual disturbance.  Respiratory: Negative for cough, chest tightness, shortness of breath, wheezing and stridor.   Cardiovascular: Negative for chest pain, palpitations, syncope and near-syncope.  Gastrointestinal: Negative for abdominal pain, constipation, diarrhea, nausea and vomiting.  Genitourinary: Negative for dysuria and flank pain.  Musculoskeletal: Positive for neck pain. Negative for back pain and neck stiffness.  Skin: Negative for rash and wound.  Neurological: Positive for headaches. Negative for light-headedness and numbness.  Psychiatric/Behavioral: Negative for agitation and confusion.  All other systems reviewed and are negative.    Physical Exam Updated  Vital Signs BP (!) 178/85 (BP Location: Left Arm)   Pulse 72   Temp 98 F (36.7 C) (Oral)   Resp 20   SpO2 98%   Physical Exam  Constitutional: She is oriented to person, place, and time. She appears well-developed and well-nourished. No distress.  HENT:  Head: Normocephalic and atraumatic.  Mouth/Throat: No oropharyngeal exudate.  Eyes: Pupils are equal, round, and reactive to light. Conjunctivae and EOM are normal.  Neck: Normal range of motion. Neck supple.  Cardiovascular: Normal rate and regular rhythm.   No murmur heard. Pulmonary/Chest: Effort normal and breath sounds normal. No stridor. No respiratory distress. She has no wheezes. She exhibits no tenderness.  Abdominal: Soft. There is no tenderness.  Musculoskeletal: She exhibits tenderness. She exhibits no edema.  Neurological: She is alert and oriented to person, place, and time. No cranial nerve deficit or sensory deficit. She exhibits normal muscle tone.  Skin: Skin is warm and dry. Capillary refill takes less than 2 seconds. No rash noted. She is not diaphoretic. No erythema.  Psychiatric: She has a normal mood and affect.  Nursing note and vitals reviewed.    ED Treatments / Results  Labs (all labs ordered are listed, but only  abnormal results are displayed) Labs Reviewed  CBC WITH DIFFERENTIAL/PLATELET - Abnormal; Notable for the following:       Result Value   Neutro Abs 9.4 (*)    Lymphs Abs 0.6 (*)    All other components within normal limits  BASIC METABOLIC PANEL - Abnormal; Notable for the following:    Sodium 134 (*)    Chloride 97 (*)    Glucose, Bld 143 (*)    BUN 23 (*)    GFR calc non Af Amer 51 (*)    GFR calc Af Amer 59 (*)    All other components within normal limits  PROTIME-INR - Abnormal; Notable for the following:    Prothrombin Time 29.3 (*)    All other components within normal limits    EKG  EKG Interpretation None       Radiology No results found.  Procedures Procedures (including critical care time)  Medications Ordered in ED Medications  sodium chloride 0.9 % bolus 1,000 mL (1,000 mLs Intravenous New Bag/Given 01/06/17 1456)  prochlorperazine (COMPAZINE) injection 10 mg (10 mg Intravenous Given 01/06/17 1457)  diphenhydrAMINE (BENADRYL) injection 25 mg (25 mg Intravenous Given 01/06/17 1456)  morphine 4 MG/ML injection 4 mg (4 mg Intravenous Given 01/06/17 1456)     Initial Impression / Assessment and Plan / ED Course  I have reviewed the triage vital signs and the nursing notes.  Pertinent labs & imaging results that were available during my care of the patient were reviewed by me and considered in my medical decision making (see chart for details).     [name] is a 83 y.o. female with a past medical history significant for hypertension, atrial fibrillation on Coumadin therapy, symptomatically bradycardia status post pacemaker, kidney stones, and recent admission for severe neck pain and headache who presents with severe neck pain and headachatient reports that she was discharged 2 days ago from this facility for intractable neck pain and headache. She was unable to receive MRI due to pacemaker. She says that after going home, her headache has returned  and is "worse than before". She denies vision changes, dizziness, numbness, tingling, or weakness of extremities, vomiting, chest pain, shortness of breath, abdominal pain. She reports no difficulty with ambulation. She  reports some mild nausea. She describes that she is been taking her steroids and medications as prescribed for the pain is worsening.  Patient says that over the last 24 hours it has continued to worsen and is now "a 50 out of 10". It is radiating from the left upper neck and occiput up into the head. She reports that her brain is "going to explode". She denies any other symptoms.  On exam, no focal neurologic deficits are seen. Patient was not ambulated but denied any difficulty with gait. Patient had normal finger-nose-finger, sensation, and strength in all extremity. Patient's lungs were clear. Chest was nontender. Back was nontender. Patient had normal extraocular movements and pupil exam was unremarkable.  Chart review shows that patient had CTA of the head and neck showing no evidence of dissection or vascular abnormality. Patient was unable to get an MRI due to the pacemaker. Patient had improvement in pain and was sent home from the hospital.  As patient is on Coumadin she will have repeat blood work to look for change in INR. Patient will also be given a headache cocktail and morphine to try and alleviate her discomfort as she reports the morphine helped previously. Given lack of new injury and similarity to prior symptoms, do not feel she needs repeat head CT however this may be considered if her INR is elevated.  Anticipate reassessment after laboratory testing and medication administration.  Care transferred to Dr. Gilford Raid while awaiting for labs and reassessment. If pt has Worsened pain, anticipate patient need readmission for pain. The patient's pain is improved, patient may be safely discharged.   Final Clinical Impressions(s) / ED Diagnoses   Final diagnoses:    Intractable headache, unspecified chronicity pattern, unspecified headache type     Clinical Impression: 1. Intractable headache, unspecified chronicity pattern, unspecified headache type     Disposition: Care transferred to Dr. Gilford Raid.      Tegeler, Gwenyth Allegra, MD 01/06/17 1626

## 2017-01-06 NOTE — ED Provider Notes (Signed)
Pt signed out by Dr. Sherry Ruffing.  The pt is feeling some better after pain meds, but she is very worried the pain will return when she goes home.  She lives alone and has not been doing well at home.  The pt d/w Dr. Florene Glen (triad) for admission for observation.   Isla Pence, MD 01/06/17 1659

## 2017-01-06 NOTE — ED Triage Notes (Signed)
Pt c/o neck pain and headache since Monday, worsening today. Pt states she was recently diagnosed with bone spur and pain has worsened and she is not able to tolerate pain, tylenol not helping with pain.

## 2017-01-06 NOTE — H&P (Signed)
History and Physical    [name] WJX:[id number] DOB: June 13, 1925 DOA: 01/06/2017  PCP: [name], MD  Patient coming from: home  Chief Complaint: headache and neck pain  HPI: [name] is a 83 y.o. female with medical history significant of atrial fibrillation, bradycardia, pacemaker, hypertension, hypothyroidism, and other medical problems presenting with headache and left-sided neck pain.   Recently admitted for this a few days ago. She was just discharged on 10/9. At that time she was discharged with plans for follow-up with Ortho to consider an injection. Since that time she notes that her pain has persisted. The pain initially started on Monday. As the first time that she's ever had this or pain. She woke up with the pain. She denies any injury. She thought she initially had a crick in her neck, but the pain was persistent sure she presented to the hospital. Since discharge, she is noting continued pain, but this got worse today. She took an anxiety pill last night which helped her sleep for a little while when she will stop she still had significant neck pain. She scrubs the pain is stabbing and constant. Nothing really makes it worse. Not much output and IV pain meds make it much better. She denies any numbness, tingling, or weakness of her arms and legs. She denies any light sensitivity. She denies any nausea or vomiting, but does note that she sometimes has nausea associated with her pain medications. She denies any fever.  Last hospital elevation she was discharged with steroid taper as well as Flexeril. She had not begun taking either of these. She saw Ortho yesterday apparently who were planning for a potential procedure.  She lives at home alone.  Family lives close by and checks on her.  Walks with cane/walker.   ED Course: Labs, pain meds. Called for admission given her persistent pain.   Review of Systems: As per HPI otherwise 10 point review of systems  negative.   Past Medical History:  Diagnosis Date  . Anxiety   . Atrial fibrillation ([place])   . Bradycardia   . Complication of anesthesia   . Difficulty sleeping   . Dysrhythmia    A FIB  . H/O hiatal hernia   . History of kidney stones   . History of skin cancer   . History of transfusion   . Hypertension   . Osteoarthritis   . Pacemaker   . Pneumonia   . PONV (postoperative nausea and vomiting)   . Shortness of breath    WITH EXERTION    Past Surgical History:  Procedure Laterality Date  . APPENDECTOMY  1960  . ARTHROPLASTY  1994   Left total knee  . ARTHROPLASTY  2000   Total right knee  . BACK SURGERY    . CHOLECYSTECTOMY    . DG SELECTED [facility] ONLY  2002   Dilation  . JOINT REPLACEMENT     knee replacement  . Microdiskectomy  05/2004   Left L4-L5  . PACEMAKER INSERTION    . TOTAL ABDOMINAL HYSTERECTOMY  1969  . TOTAL HIP ARTHROPLASTY Right 12/24/2013   Procedure: RIGHT TOTAL HIP ARTHROPLASTY ANTERIOR APPROACH;  Surgeon: Gearlean Alf, MD;  Location: [facility];  Service: Orthopedics;  Laterality: Right;     reports that she quit smoking about 43 years ago. She has quit using smokeless tobacco. She reports that she does not drink alcohol or use drugs.  Allergies  Allergen Reactions  . Morphine And  Related Nausea And Vomiting    Family History  Problem Relation Age of Onset  . Heart disease Mother   . Leukemia Father    Prior to Admission medications   Medication Sig Start Date End Date Taking? Authorizing Provider  amLODipine (NORVASC) 10 MG tablet Take 10 mg by mouth every morning.    Yes [provider]  clonazePAM (KLONOPIN) 0.5 MG tablet Take 0.5 mg by mouth daily as needed for anxiety.  03/18/15  Yes [provider]  cloNIDine (CATAPRES) 0.1 MG tablet Take 0.1 mg by mouth 2 (two) times daily.    Yes [provider]  furosemide (LASIX) 20 MG tablet Take 2 tablets (40 mg total) by mouth daily. 01/04/17 01/18/17 Yes Florencia Reasons, MD  irbesartan (AVAPRO) 300 MG tablet Take 300 mg by mouth every morning.   Yes [provider]  methimazole (TAPAZOLE) 5 MG tablet TAKE ONE TABLET BY MOUTH DAILY WITH FOOD 10/28/15  Yes [provider]  pantoprazole (PROTONIX) 40 MG tablet Take 1 tablet (40 mg total) by mouth daily. 06/16/16  Yes Elgergawy, Silver Huguenin, MD  potassium chloride SA (K-DUR,KLOR-CON) 20 MEQ tablet Take 20 mEq by mouth every morning.    Yes [provider]  pravastatin (PRAVACHOL) 40 MG tablet Take 40 mg by mouth every morning.    Yes [provider]  PREVIDENT 5000 PLUS 1.1 % CREA dental cream Take 1 application by mouth at bedtime. As directed 05/25/16  Yes [provider]  triamterene-hydrochlorothiazide (DYAZIDE) 37.5-25 MG capsule TAKE 1 EACH (1 CAPSULE TOTAL) BY MOUTH DAILY. 01/04/17  Yes [provider]  warfarin (COUMADIN) 2 MG tablet Take 1-2 tablets (2-4 mg total) by mouth as directed. Take Coumadin for three weeks for postoperative protocol and then the patient may resume their previous Coumadin home regimen.  The dose may need to be adjusted based upon the INR.  Please follow the INR and titrate Coumadin dose for a therapeutic range between 2.0 and 3.0 INR.  After completing the three weeks of Coumadin, the patient may resume their previous Coumadin home regimen. Patient taking differently: Take 2-4 mg by mouth daily. Take 2mg  by mouth Sunday, Tuesday, Wednesday, Friday, and saturday. Take 4mg  by mouth on Monday and Thursday. 12/27/13  Yes Perkins, Alexzandrew L, PA-C  acetaminophen (TYLENOL) 325 MG tablet Take 650 mg by mouth every 6 (six) hours as needed (pain).    [provider]  cyclobenzaprine (FLEXERIL) 5 MG tablet Take 1 tablet (5 mg total) by mouth 3 (three) times daily as needed for muscle spasms. 01/04/17   Florencia Reasons, MD  predniSONE (DELTASONE) 10 MG tablet Label  & dispense according to the schedule below. 6 Pills PO on day one then, 5 Pills PO  on day two, 4 Pills PO on day three, 3Pills PO on day four, 2 Pills PO on day five, 1 Pills PO on day six,  then STOP.  Total of 21 tabs 01/04/17   Florencia Reasons, MD    Physical Exam: Vitals:   01/06/17 1550 01/06/17 1915 01/06/17 1918 01/06/17 2040  BP: 137/77  140/76 (!) 170/98  Pulse: 69 70  87  Resp: 13 15  15   Temp:    98 F (36.7 C)  TempSrc:    Oral  SpO2: 99% 96%  98%  Weight:    68.1 kg (150 lb 2.1 oz)  Height:    5\' 4"  (1.626 m)    Constitutional: NAD, calm, comfortable Vitals:   01/06/17  1550 01/06/17 1915 01/06/17 1918 01/06/17 2040  BP: 137/77  140/76 (!) 170/98  Pulse: 69 70  87  Resp: 13 15  15   Temp:    98 F (36.7 C)  TempSrc:    Oral  SpO2: 99% 96%  98%  Weight:    68.1 kg (150 lb 2.1 oz)  Height:    5\' 4"  (1.626 m)   Appears uncomfortable Eyes: PERRL, lids and conjunctivae normal ENMT: Mucous membranes are moist. Posterior pharynx clear of any exudate or lesions.Normal dentition.  Neck: normal, supple, no masses, no thyromegaly.  TTP to L posterior neck and L side of occipital region of head.  Respiratory: clear to auscultation bilaterally, no wheezing, no crackles. Normal respiratory effort. No accessory muscle use.  Cardiovascular: Regular rate and rhythm, no murmurs / rubs / gallops. No extremity edema. 2+ pedal pulses. No carotid bruits.  Abdomen: no tenderness, no masses palpated. No hepatosplenomegaly. Bowel sounds positive.  Musculoskeletal: no clubbing / cyanosis. No joint deformity upper and lower extremities. Good ROM, no contractures. Normal muscle tone.  Skin: no rashes, lesions, ulcers. No induration Neurologic: CN 2-12 intact. Sensation intact. Strength 5/5 in all 4.  Psychiatric: Normal judgment and insight. Alert and oriented. Normal mood.   Labs on Admission: I have personally reviewed following labs and imaging studies  CBC:  Recent Labs Lab 01/03/17 2003 01/04/17 0626 01/06/17 1445  WBC 11.1* 10.1 10.0  NEUTROABS 9.4*  --  9.4*    HGB 12.3 11.6* 12.3  HCT 36.8 34.5* 36.9  MCV 88.2 88.2 88.5  PLT 276 265 474   Basic Metabolic Panel:  Recent Labs Lab 01/03/17 2003 01/04/17 0626 01/06/17 1445  NA 132* 132* 134*  K 4.9 4.0 3.9  CL 94* 98* 97*  CO2 26 24 26   GLUCOSE 117* 137* 143*  BUN 17 17 23*  CREATININE 1.07* 0.99 0.96  CALCIUM 9.3 8.9 9.2  MG  --  1.7  --   PHOS  --  2.7  --    GFR: Estimated Creatinine Clearance: 37.7 mL/min (by C-G formula based on SCr of 0.96 mg/dL). Liver Function Tests:  Recent Labs Lab 01/03/17 2003 01/04/17 0626  AST 42* 21  ALT 12* 14  ALKPHOS 96 94  BILITOT 1.7* 0.8  PROT 7.7 7.1  ALBUMIN 4.4 4.0   No results for input(s): LIPASE, AMYLASE in the last 168 hours. No results for input(s): AMMONIA in the last 168 hours. Coagulation Profile:  Recent Labs Lab 01/03/17 2003 01/04/17 0626 01/06/17 1445  INR 1.82 2.81 2.80   Cardiac Enzymes: No results for input(s): CKTOTAL, CKMB, CKMBINDEX, TROPONINI in the last 168 hours. BNP (last 3 results) No results for input(s): PROBNP in the last 8760 hours. HbA1C: No results for input(s): HGBA1C in the last 72 hours. CBG: No results for input(s): GLUCAP in the last 168 hours. Lipid Profile: No results for input(s): CHOL, HDL, LDLCALC, TRIG, CHOLHDL, LDLDIRECT in the last 72 hours. Thyroid Function Tests:  Recent Labs  01/04/17 0626  TSH 2.102   Anemia Panel: No results for input(s): VITAMINB12, FOLATE, FERRITIN, TIBC, IRON, RETICCTPCT in the last 72 hours. Urine analysis:    Component Value Date/Time   COLORURINE YELLOW 05/13/2015 2000   APPEARANCEUR CLEAR 05/13/2015 2000   LABSPEC 1.008 05/13/2015 2000   PHURINE 7.5 05/13/2015 2000   GLUCOSEU NEGATIVE 05/13/2015 2000   HGBUR NEGATIVE 05/13/2015 2000   BILIRUBINUR NEGATIVE 05/13/2015 2000   KETONESUR NEGATIVE 05/13/2015 2000   PROTEINUR NEGATIVE 05/13/2015  2000   UROBILINOGEN 0.2 11/12/2014 1331   NITRITE NEGATIVE 05/13/2015 2000   LEUKOCYTESUR SMALL  (A) 05/13/2015 2000    Radiological Exams on Admission: No results found.  EKG: Independently reviewed. none  Assessment/Plan Active Problems:   Essential hypertension   ATRIAL FIBRILLATION   GERD   Cardiac pacemaker in situ   Intractable headache  Headache and neck pain:  Patient with new headache this week with associated neck pain.  CTA on presentation on 10/8 notable for no acute vascular abnormality, mild atheromatous disease.  She is tender to palpation which is somewhat reassuring as it may suggest a musculoskeletal component of her pain (pain was paraspinal, not C spine). She has a nonfocal exam.  Imaging at previous hospitalization was not concerning for hemorrhage or vasculopathy.  An MRI was considered to evaluate for nerve compression, but her pacemaker precludes this.  She is admitted due to intractable pain today. Flexeril, voltaren.  lidocaine patch Schedule tylenol. Will start pred taper tomorrow (60 mg pred ordered x1 tomorrow morning, if staying will need continued taper ordered) PT/OT (was sent with home health at previous hospitalization) F/u with ortho outpatient as planned  Atrial fibrillation  Bradycardia  Pacemaker:  INR therapeutic Warfarin per pharmacy  HTN: Continue clonidine, amlodipine, irbesartan, and Lasix (previously on maxide, but this was discontinued at the last admission as cardiology had recently started her on lasix. She really has follow-up with cardiology on 10/24.)  Hyperthyroidism: methimazole.  Goiter on imaging.  F/u outpatient as indicated.   GERD: home protonix  HLD: continue statin Hypokalemia: continue home K   DVT prophylaxis: warfarin  Code Status: DNR  Family Communication: son in room  Disposition Plan: obs, tomorrow pending pain control  Consults called: none  Admission status: obs  Fayrene Helper MD [facility] [phone]   If 7PM-7AM, please contact night-coverage [email or web address] Password  [facility]  01/06/2017, 8:47 PM

## 2017-01-06 NOTE — ED Notes (Signed)
Call report to [facility] [phone] @ [phone]

## 2017-01-06 NOTE — Progress Notes (Signed)
[name] for warfarin Indication: atrial fibrillation  Allergies  Allergen Reactions  . Morphine And Related Nausea And Vomiting    Patient Measurements:   Heparin Dosing Weight:   Vital Signs: Temp: 98 F (36.7 C) (10/11 1338) Temp Source: Oral (10/11 1338) BP: 137/77 (10/11 1550) Pulse Rate: 69 (10/11 1550)  Labs:  Recent Labs  01/03/17 2003 01/04/17 0626 01/06/17 1445  HGB 12.3 11.6* 12.3  HCT 36.8 34.5* 36.9  PLT 276 265 282  LABPROT 20.9* 29.3* 29.3*  INR 1.82 2.81 2.80  CREATININE 1.07* 0.99 0.96    Estimated Creatinine Clearance: 37.9 mL/min (by C-G formula based on SCr of 0.96 mg/dL).   Medications:  PTA warfarin regimen: 2mg  daily except 4 mg on Mondays and Thursdays (Last dose taken on 10/10)  Assessment: Patient's an 83 y.o F with hx afib on warfarin PTA, recently discharged from [facility] on 01/04/17, presented to ED on 01/06/17 with c/o worsening of neck pain and headache.  To resume warfarin for patient while she is hospitalized.  Today, 01/06/2017: - INT is therapeutic at 2.80 - cbc ok - no bleeding documented   Goal of Therapy:  INR 2-3 Monitor platelets by anticoagulation protocol: Yes   Plan:  - Resume home warfarin regimen of 2mg  daily except 4 mg on Mondays and Thursdays - daily INR - monitor for s/s bleeding  [name] 01/06/2017,6:06 PM

## 2017-01-07 ENCOUNTER — Encounter (HOSPITAL_COMMUNITY): Payer: Self-pay | Admitting: Radiology

## 2017-01-07 ENCOUNTER — Observation Stay (HOSPITAL_COMMUNITY): Payer: Medicare Other

## 2017-01-07 DIAGNOSIS — M542 Cervicalgia: Principal | ICD-10-CM

## 2017-01-07 DIAGNOSIS — R51 Headache: Secondary | ICD-10-CM | POA: Diagnosis not present

## 2017-01-07 DIAGNOSIS — I4891 Unspecified atrial fibrillation: Secondary | ICD-10-CM | POA: Diagnosis not present

## 2017-01-07 DIAGNOSIS — M47812 Spondylosis without myelopathy or radiculopathy, cervical region: Secondary | ICD-10-CM | POA: Diagnosis not present

## 2017-01-07 DIAGNOSIS — I10 Essential (primary) hypertension: Secondary | ICD-10-CM | POA: Diagnosis not present

## 2017-01-07 LAB — PROTIME-INR
INR: 2.75
PROTHROMBIN TIME: 28.9 s — AB (ref 11.4–15.2)

## 2017-01-07 MED ORDER — SENNOSIDES-DOCUSATE SODIUM 8.6-50 MG PO TABS
2.0000 | ORAL_TABLET | Freq: Two times a day (BID) | ORAL | Status: DC
  Administered 2017-01-07 – 2017-01-10 (×5): 2 via ORAL
  Filled 2017-01-07 (×6): qty 2

## 2017-01-07 MED ORDER — POLYETHYLENE GLYCOL 3350 17 G PO PACK
17.0000 g | PACK | Freq: Every day | ORAL | Status: DC
  Administered 2017-01-07 – 2017-01-10 (×3): 17 g via ORAL
  Filled 2017-01-07 (×4): qty 1

## 2017-01-07 MED ORDER — IOPAMIDOL (ISOVUE-300) INJECTION 61%
INTRAVENOUS | Status: AC
  Filled 2017-01-07: qty 75

## 2017-01-07 MED ORDER — MORPHINE SULFATE (PF) 4 MG/ML IV SOLN
1.0000 mg | INTRAVENOUS | Status: DC | PRN
  Administered 2017-01-07 – 2017-01-09 (×5): 2 mg via INTRAVENOUS
  Filled 2017-01-07 (×6): qty 1

## 2017-01-07 MED ORDER — WARFARIN SODIUM 2 MG PO TABS: 2.0000 mg | ORAL_TABLET | ORAL | Status: DC

## 2017-01-07 MED ORDER — IOPAMIDOL (ISOVUE-300) INJECTION 61%
75.0000 mL | Freq: Once | INTRAVENOUS | Status: AC | PRN
  Administered 2017-01-07: 75 mL via INTRAVENOUS

## 2017-01-07 MED ORDER — WARFARIN SODIUM 4 MG PO TABS: 4.0000 mg | ORAL_TABLET | ORAL | Status: DC

## 2017-01-07 NOTE — Care Management Obs Status (Signed)
[facility] NOTIFICATION   Patient Details  Name: [name] MRN: [id number] Date of Birth: Dec 25, 1925   Medicare Observation Status Notification Given:  Yes    MahabirJuliann Pulse, RN 01/07/2017, 12:35 PM

## 2017-01-07 NOTE — Care Management Note (Signed)
Case Management Note  Patient Details  Name: [name] MRN: [id number] Date of Birth: 08-19-1925  Subjective/Objective: 83 y/o f admitted w/intractable headache. From home.                   Action/Plan:d/c plan home.   Expected Discharge Date:   (unknown)               Expected Discharge Plan:  Home/Self Care  In-House Referral:     Discharge planning Services  CM Consult  Post Acute Care Choice:    Choice offered to:     DME Arranged:    DME Agency:     HH Arranged:    HH Agency:     Status of Service:  In process, will continue to follow  If discussed at Long Length of Stay Meetings, dates discussed:    Additional Comments:  Dessa Phi, RN 01/07/2017, 12:34 PM

## 2017-01-07 NOTE — Progress Notes (Signed)
PROGRESS NOTE    [name]  QMV:[id number] DOB: [birth year] DOA: 01/06/2017 PCP: [name], MD    Brief Narrative: [name] is a 83 y.o. female with medical history significant of atrial fibrillation, bradycardia, pacemaker, hypertension, hypothyroidism, and other medical problems presenting with headache and left-sided neck pain.   Assessment & Plan:   Active Problems:   Essential hypertension   ATRIAL FIBRILLATION   GERD   Cardiac pacemaker in situ   Intractable headache   Left sided neck pain;  Pt reports chronic cervical spondylopathy and uses cortisone injections once every 6 months and tells [place] its time for injections.  Currently on IV pain meds.  Requested IR and her orthopedics to see if she can get cervical nerve root block injection CT cervical spine ordered   Headache from the left sided neck pain.  Pain control.      Atrial fibrillation:  Rate controlled.  Anticoagulation on hold so she can get the cortisone injections.    Hypertension: controlled.    GERD; stable.    DVT prophylaxis:  Code Status: full code.  Family Communication: none at bedside  Disposition Plan: possibly home when pain is controlled.   Consultants:   IR  Orthopedics Dr Nelva Bush.   Procedures: CT cervical spine.   Antimicrobials: none.    Subjective: Severe headache and neck pain.   Objective: Vitals:   01/06/17 1915 01/06/17 1918 01/06/17 2040 01/07/17 0408  BP:  140/76 (!) 170/98 124/64  Pulse: 70  87 61  Resp: 15  15 18   Temp:   98 F (36.7 C) 98 F (36.7 C)  TempSrc:   Oral Oral  SpO2: 96%  98% 93%  Weight:   68.1 kg (150 lb 2.1 oz)   Height:   5\' 4"  (1.626 m)     Intake/Output Summary (Last 24 hours) at 01/07/17 1901 Last data filed at 01/07/17 1354  Gross per 24 hour  Intake             1480 ml  Output                0 ml  Net             1480 ml   Filed Weights   01/06/17 2040  Weight: 68.1 kg (150 lb 2.1 oz)     Examination:  General exam: Appears calm and comfortable  Respiratory system: Clear to auscultation. Respiratory effort normal. Cardiovascular system: S1 & S2 heard, RRR. No JVD, murmurs, rubs, gallops or clicks. No pedal edema. Gastrointestinal system: Abdomen is nondistended, soft and nontender. No organomegaly or masses felt. Normal bowel sounds heard. Central nervous system: Alert and oriented. No focal neurological deficits. Extremities: Symmetric 5 x 5 power. Skin: No rashes, lesions or ulcers Psychiatry: Judgement and insight appear normal. Mood & affect appropriate.     Data Reviewed: I have personally reviewed following labs and imaging studies  CBC:  Recent Labs Lab 01/03/17 2003 01/04/17 0626 01/06/17 1445  WBC 11.1* 10.1 10.0  NEUTROABS 9.4*  --  9.4*  HGB 12.3 11.6* 12.3  HCT 36.8 34.5* 36.9  MCV 88.2 88.2 88.5  PLT 276 265 284   Basic Metabolic Panel:  Recent Labs Lab 01/03/17 2003 01/04/17 0626 01/06/17 1445  NA 132* 132* 134*  K 4.9 4.0 3.9  CL 94* 98* 97*  CO2 26 24 26   GLUCOSE 117* 137* 143*  BUN 17 17 23*  CREATININE 1.07* 0.99 0.96  CALCIUM 9.3  8.9 9.2  MG  --  1.7  --   PHOS  --  2.7  --    GFR: Estimated Creatinine Clearance: 37.7 mL/min (by C-G formula based on SCr of 0.96 mg/dL). Liver Function Tests:  Recent Labs Lab 01/03/17 2003 01/04/17 0626  AST 42* 21  ALT 12* 14  ALKPHOS 96 94  BILITOT 1.7* 0.8  PROT 7.7 7.1  ALBUMIN 4.4 4.0   No results for input(s): LIPASE, AMYLASE in the last 168 hours. No results for input(s): AMMONIA in the last 168 hours. Coagulation Profile:  Recent Labs Lab 01/03/17 2003 01/04/17 0626 01/06/17 1445 01/07/17 0452  INR 1.82 2.81 2.80 2.75   Cardiac Enzymes: No results for input(s): CKTOTAL, CKMB, CKMBINDEX, TROPONINI in the last 168 hours. BNP (last 3 results) No results for input(s): PROBNP in the last 8760 hours. HbA1C: No results for input(s): HGBA1C in the last 72  hours. CBG: No results for input(s): GLUCAP in the last 168 hours. Lipid Profile: No results for input(s): CHOL, HDL, LDLCALC, TRIG, CHOLHDL, LDLDIRECT in the last 72 hours. Thyroid Function Tests: No results for input(s): TSH, T4TOTAL, FREET4, T3FREE, THYROIDAB in the last 72 hours. Anemia Panel: No results for input(s): VITAMINB12, FOLATE, FERRITIN, TIBC, IRON, RETICCTPCT in the last 72 hours. Sepsis Labs: No results for input(s): PROCALCITON, LATICACIDVEN in the last 168 hours.  No results found for this or any previous visit (from the past 240 hour(s)).       Radiology Studies: No results found.      Scheduled Meds: . acetaminophen  650 mg Oral Q6H  . amLODipine  10 mg Oral q morning - 10a  . cloNIDine  0.1 mg Oral BID  . diclofenac sodium  2 g Topical QID  . furosemide  40 mg Oral Daily  . iopamidol      . irbesartan  300 mg Oral q morning - 10a  . lidocaine  1 patch Transdermal Q24H  . methimazole  5 mg Oral Daily  . pantoprazole  40 mg Oral Daily  . polyethylene glycol  17 g Oral Daily  . potassium chloride SA  20 mEq Oral q morning - 10a  . pravastatin  40 mg Oral q morning - 10a  . senna-docusate  2 tablet Oral BID   Continuous Infusions:   LOS: 0 days    Time spent: 55 min    Pari Lombard, MD [facility] Pager [phone]  If 7PM-7AM, please contact night-coverage [email or web address] Password [email or web address] 01/07/2017, 7:01 PM

## 2017-01-08 DIAGNOSIS — Z950 Presence of cardiac pacemaker: Secondary | ICD-10-CM | POA: Diagnosis not present

## 2017-01-08 DIAGNOSIS — Z87891 Personal history of nicotine dependence: Secondary | ICD-10-CM | POA: Diagnosis not present

## 2017-01-08 DIAGNOSIS — I10 Essential (primary) hypertension: Secondary | ICD-10-CM | POA: Diagnosis not present

## 2017-01-08 DIAGNOSIS — Z90710 Acquired absence of both cervix and uterus: Secondary | ICD-10-CM | POA: Diagnosis not present

## 2017-01-08 DIAGNOSIS — E0500 Thyrotoxicosis with diffuse goiter without thyrotoxic crisis or storm: Secondary | ICD-10-CM | POA: Diagnosis present

## 2017-01-08 DIAGNOSIS — Z79899 Other long term (current) drug therapy: Secondary | ICD-10-CM | POA: Diagnosis not present

## 2017-01-08 DIAGNOSIS — E876 Hypokalemia: Secondary | ICD-10-CM | POA: Diagnosis present

## 2017-01-08 DIAGNOSIS — Z66 Do not resuscitate: Secondary | ICD-10-CM | POA: Diagnosis present

## 2017-01-08 DIAGNOSIS — R51 Headache: Secondary | ICD-10-CM | POA: Diagnosis not present

## 2017-01-08 DIAGNOSIS — M489 Spondylopathy, unspecified: Secondary | ICD-10-CM | POA: Diagnosis present

## 2017-01-08 DIAGNOSIS — D72829 Elevated white blood cell count, unspecified: Secondary | ICD-10-CM | POA: Diagnosis present

## 2017-01-08 DIAGNOSIS — E871 Hypo-osmolality and hyponatremia: Secondary | ICD-10-CM | POA: Diagnosis present

## 2017-01-08 DIAGNOSIS — Z96651 Presence of right artificial knee joint: Secondary | ICD-10-CM | POA: Diagnosis present

## 2017-01-08 DIAGNOSIS — Z7901 Long term (current) use of anticoagulants: Secondary | ICD-10-CM | POA: Diagnosis not present

## 2017-01-08 DIAGNOSIS — Z885 Allergy status to narcotic agent status: Secondary | ICD-10-CM | POA: Diagnosis not present

## 2017-01-08 DIAGNOSIS — Z96641 Presence of right artificial hip joint: Secondary | ICD-10-CM | POA: Diagnosis present

## 2017-01-08 DIAGNOSIS — I4891 Unspecified atrial fibrillation: Secondary | ICD-10-CM | POA: Diagnosis not present

## 2017-01-08 DIAGNOSIS — Z85828 Personal history of other malignant neoplasm of skin: Secondary | ICD-10-CM | POA: Diagnosis not present

## 2017-01-08 DIAGNOSIS — K219 Gastro-esophageal reflux disease without esophagitis: Secondary | ICD-10-CM | POA: Diagnosis not present

## 2017-01-08 DIAGNOSIS — M542 Cervicalgia: Secondary | ICD-10-CM | POA: Diagnosis not present

## 2017-01-08 DIAGNOSIS — F419 Anxiety disorder, unspecified: Secondary | ICD-10-CM | POA: Diagnosis present

## 2017-01-08 DIAGNOSIS — E785 Hyperlipidemia, unspecified: Secondary | ICD-10-CM | POA: Diagnosis present

## 2017-01-08 LAB — PROTIME-INR
INR: 2.64
PROTHROMBIN TIME: 27.9 s — AB (ref 11.4–15.2)

## 2017-01-08 MED ORDER — BISACODYL 10 MG RE SUPP
10.0000 mg | Freq: Once | RECTAL | Status: AC
  Administered 2017-01-08: 10 mg via RECTAL
  Filled 2017-01-08: qty 1

## 2017-01-08 MED ORDER — ONDANSETRON HCL 4 MG/2ML IJ SOLN
4.0000 mg | Freq: Four times a day (QID) | INTRAMUSCULAR | Status: DC | PRN
  Administered 2017-01-08: 4 mg via INTRAVENOUS
  Filled 2017-01-08: qty 2

## 2017-01-08 NOTE — Progress Notes (Addendum)
PROGRESS NOTE    [name]  UDJ:[id number] DOB: [birth year] DOA: 01/06/2017 PCP: [name], MD    Brief Narrative: [name] is a 83 y.o. female with medical history significant of atrial fibrillation, bradycardia, pacemaker, hypertension, hypothyroidism, and other medical problems presenting with headache and left-sided neck pain.   Assessment & Plan:   Active Problems:   Essential hypertension   ATRIAL FIBRILLATION   GERD   Cardiac pacemaker in situ   Intractable headache   Left sided neck pain;  Pt reports chronic cervical spondylopathy and uses cortisone injections once every 6 months and tells [place] its time for injections.  Currently on IV pain meds.  Requested IR and her orthopedics to see if she can get cervical nerve root block injection CT cervical spine ordered, done but results are pending.  Her pain not well controlled with oral meds and requiring IV morphine for pain control.  Could not reach Dr Nelva Bush her orthopedics physician who does her cortisone injections.    Headache from the left sided neck pain.  Pain control.      Atrial fibrillation:  Rate controlled.  Anticoagulation on hold so she can get the cortisone injections.    Hypertension: controlled.    GERD; stable.     Mild hyponatremia    Improved.     DVT prophylaxis: scd's Code Status: full code.  Family Communication: none at bedside  Disposition Plan: possibly home when pain is controlled.   Consultants:   IR  Orthopedics Dr Nelva Bush.   Procedures: CT cervical spine.   Antimicrobials: none.    Subjective: Persistent headache and neck pain.   Objective: Vitals:   01/06/17 2040 01/07/17 0408 01/07/17 2008 01/08/17 0447  BP: (!) 170/98 124/64 (!) 155/60 (!) 158/78  Pulse: 87 61 63 60  Resp: 15 18 18 17   Temp: 98 F (36.7 C) 98 F (36.7 C) 98 F (36.7 C) 98.5 F (36.9 C)  TempSrc: Oral Oral Oral Oral  SpO2: 98% 93% 95% 97%  Weight: 68.1 kg (150 lb  2.1 oz)     Height: 5\' 4"  (1.626 m)       Intake/Output Summary (Last 24 hours) at 01/08/17 1711 Last data filed at 01/08/17 0854  Gross per 24 hour  Intake              360 ml  Output                0 ml  Net              360 ml   Filed Weights   01/06/17 2040  Weight: 68.1 kg (150 lb 2.1 oz)    Examination:  General exam: Appears anxious and in mod pain from headache.  Respiratory system: Clear to auscultation. Respiratory effort normal. Cardiovascular system: S1 & S2 heard, RRR. No JVD, murmurs, rubs, gallops or clicks. No pedal edema. Gastrointestinal system: Abdomen is nondistended, soft and nontender. No organomegaly or masses felt. Normal bowel sounds heard. Central nervous system: Alert and oriented. No focal neurological deficits. Extremities: Symmetric 5 x 5 power. Skin: No rashes, lesions or ulcers Psychiatry: Judgement and insight appear normal. Mood & affect appropriate.     Data Reviewed: I have personally reviewed following labs and imaging studies  CBC:  Recent Labs Lab 01/03/17 2003 01/04/17 0626 01/06/17 1445  WBC 11.1* 10.1 10.0  NEUTROABS 9.4*  --  9.4*  HGB 12.3 11.6* 12.3  HCT 36.8 34.5* 36.9  MCV  88.2 88.2 88.5  PLT 276 265 250   Basic Metabolic Panel:  Recent Labs Lab 01/03/17 2003 01/04/17 0626 01/06/17 1445  NA 132* 132* 134*  K 4.9 4.0 3.9  CL 94* 98* 97*  CO2 26 24 26   GLUCOSE 117* 137* 143*  BUN 17 17 23*  CREATININE 1.07* 0.99 0.96  CALCIUM 9.3 8.9 9.2  MG  --  1.7  --   PHOS  --  2.7  --    GFR: Estimated Creatinine Clearance: 37.7 mL/min (by C-G formula based on SCr of 0.96 mg/dL). Liver Function Tests:  Recent Labs Lab 01/03/17 2003 01/04/17 0626  AST 42* 21  ALT 12* 14  ALKPHOS 96 94  BILITOT 1.7* 0.8  PROT 7.7 7.1  ALBUMIN 4.4 4.0   No results for input(s): LIPASE, AMYLASE in the last 168 hours. No results for input(s): AMMONIA in the last 168 hours. Coagulation Profile:  Recent Labs Lab  01/03/17 2003 01/04/17 0626 01/06/17 1445 01/07/17 0452 01/08/17 1017  INR 1.82 2.81 2.80 2.75 2.64   Cardiac Enzymes: No results for input(s): CKTOTAL, CKMB, CKMBINDEX, TROPONINI in the last 168 hours. BNP (last 3 results) No results for input(s): PROBNP in the last 8760 hours. HbA1C: No results for input(s): HGBA1C in the last 72 hours. CBG: No results for input(s): GLUCAP in the last 168 hours. Lipid Profile: No results for input(s): CHOL, HDL, LDLCALC, TRIG, CHOLHDL, LDLDIRECT in the last 72 hours. Thyroid Function Tests: No results for input(s): TSH, T4TOTAL, FREET4, T3FREE, THYROIDAB in the last 72 hours. Anemia Panel: No results for input(s): VITAMINB12, FOLATE, FERRITIN, TIBC, IRON, RETICCTPCT in the last 72 hours. Sepsis Labs: No results for input(s): PROCALCITON, LATICACIDVEN in the last 168 hours.  No results found for this or any previous visit (from the past 240 hour(s)).       Radiology Studies: No results found.      Scheduled Meds: . acetaminophen  650 mg Oral Q6H  . amLODipine  10 mg Oral q morning - 10a  . cloNIDine  0.1 mg Oral BID  . diclofenac sodium  2 g Topical QID  . furosemide  40 mg Oral Daily  . irbesartan  300 mg Oral q morning - 10a  . lidocaine  1 patch Transdermal Q24H  . methimazole  5 mg Oral Daily  . pantoprazole  40 mg Oral Daily  . polyethylene glycol  17 g Oral Daily  . potassium chloride SA  20 mEq Oral q morning - 10a  . pravastatin  40 mg Oral q morning - 10a  . senna-docusate  2 tablet Oral BID   Continuous Infusions:   LOS: 0 days    Time spent: 72 min    Neda Willenbring, MD [facility] Pager [phone]  If 7PM-7AM, please contact night-coverage [email or web address] Password [email or web address] 01/08/2017, 5:11 PM

## 2017-01-08 NOTE — Progress Notes (Signed)
IR aware of request for C5 nerve root block.  This is a specialized procedure requiring scheduling with an appropriate MD. No available MD for this procedure as an inpatient at this time.  Recommend order for procedure be placed at discharge and patient can be scheduled for outpatient procedure. Note that patient is also followed by Orthopedics for her pain and may already have plans for procedure.   I have spoken with ordering MD.  Brynda Greathouse, MS RD PA-C 8:41 AM

## 2017-01-09 DIAGNOSIS — K219 Gastro-esophageal reflux disease without esophagitis: Secondary | ICD-10-CM

## 2017-01-09 LAB — BASIC METABOLIC PANEL
Anion gap: 9 (ref 5–15)
BUN: 25 mg/dL — AB (ref 6–20)
CHLORIDE: 96 mmol/L — AB (ref 101–111)
CO2: 27 mmol/L (ref 22–32)
Calcium: 8.2 mg/dL — ABNORMAL LOW (ref 8.9–10.3)
Creatinine, Ser: 1.03 mg/dL — ABNORMAL HIGH (ref 0.44–1.00)
GFR calc Af Amer: 54 mL/min — ABNORMAL LOW (ref >60)
GFR calc non Af Amer: 47 mL/min — ABNORMAL LOW (ref >60)
Glucose, Bld: 89 mg/dL (ref 65–99)
Potassium: 3.5 mmol/L (ref 3.5–5.1)
SODIUM: 132 mmol/L — AB (ref 135–145)

## 2017-01-09 LAB — CBC WITH DIFFERENTIAL/PLATELET
Basophils Absolute: 0 10*3/uL (ref 0.0–0.1)
Basophils Relative: 0 %
EOS PCT: 1 %
Eosinophils Absolute: 0.1 10*3/uL (ref 0.0–0.7)
HEMATOCRIT: 35.2 % — AB (ref 36.0–46.0)
HEMOGLOBIN: 11.9 g/dL — AB (ref 12.0–15.0)
LYMPHS ABS: 1.6 10*3/uL (ref 0.7–4.0)
LYMPHS PCT: 8 %
MCH: 29.8 pg (ref 26.0–34.0)
MCHC: 33.8 g/dL (ref 30.0–36.0)
MCV: 88 fL (ref 78.0–100.0)
Monocytes Absolute: 1.5 10*3/uL — ABNORMAL HIGH (ref 0.1–1.0)
Monocytes Relative: 8 %
NEUTROS PCT: 83 %
Neutro Abs: 16 10*3/uL — ABNORMAL HIGH (ref 1.7–7.7)
Platelets: 268 10*3/uL (ref 150–400)
RBC: 4 MIL/uL (ref 3.87–5.11)
RDW: 14.8 % (ref 11.5–15.5)
WBC: 19.1 10*3/uL — AB (ref 4.0–10.5)

## 2017-01-09 MED ORDER — CLONAZEPAM 0.5 MG PO TABS: 0.5000 mg | ORAL_TABLET | Freq: Two times a day (BID) | ORAL | Status: DC | PRN

## 2017-01-09 MED ORDER — MENTHOL 3 MG MT LOZG
1.0000 | LOZENGE | OROMUCOSAL | Status: DC | PRN
  Filled 2017-01-09: qty 9

## 2017-01-09 NOTE — Progress Notes (Signed)
PROGRESS NOTE    [name]  KGU:[id number] DOB: 1926-03-05 DOA: 01/06/2017 PCP: [name], MD    Brief Narrative: [name] is a 83 y.o. female with medical history significant of atrial fibrillation, bradycardia, pacemaker, hypertension, hypothyroidism, and other medical problems presenting with headache and left-sided neck pain.   Assessment & Plan:   Active Problems:   Essential hypertension   ATRIAL FIBRILLATION   GERD   Cardiac pacemaker in situ   Intractable headache   Left sided neck pain;  Pt reports chronic cervical spondylopathy and uses cortisone injections once every 6 months and tells [place] its time for injections.  Currently on IV pain meds.  Requested IR and her orthopedics to see if she can get cervical nerve root block injection CT cervical spine ordered, done but results are pending.  Her pain not well controlled with oral meds and requiring IV morphine for pain control.  Could not reach Dr Nelva Bush her orthopedics physician who does her cortisone injections.    Leukocytosis: Unclear etiology. Probably reactive.    Headache from the left sided neck pain.  Pain control.  Discharge home once pain is controlled.     Atrial fibrillation:  Rate controlled.  Anticoagulation on hold so she can get the cortisone injections.  Recheck INR in am.    Hypertension: controlled.    GERD; stable.     Mild hyponatremia    Improved.     DVT prophylaxis: scd's Code Status: full code.  Family Communication: none at bedside  Disposition Plan: possibly home when pain is controlled.   Consultants:   IR  Orthopedics Dr Nelva Bush.   Procedures: CT cervical spine.   Antimicrobials: none.    Subjective: Slight improvement in neck pain today.   Objective: Vitals:   01/08/17 0447 01/08/17 2011 01/09/17 0512 01/09/17 1428  BP: (!) 158/78 (!) 131/51 113/61 (!) 102/49  Pulse: 60 72 62 (!) 59  Resp: 17 17    Temp: 98.5 F (36.9 C) 97.6  F (36.4 C) 98.6 F (37 C) 98.5 F (36.9 C)  TempSrc: Oral Oral Oral Oral  SpO2: 97% 96% 97% 96%  Weight:      Height:        Intake/Output Summary (Last 24 hours) at 01/09/17 1754 Last data filed at 01/09/17 0232  Gross per 24 hour  Intake              360 ml  Output                0 ml  Net              360 ml   Filed Weights   01/06/17 2040  Weight: 68.1 kg (150 lb 2.1 oz)    Examination:  General exam: appears calm. Respiratory system: Clear to auscultation. Respiratory effort normal. No wheezing or honchi.  Cardiovascular system: S1 & S2 heard, RRR. No JVD, murmurs. No pedal edema. Gastrointestinal system: Abdomen is sof tNT ND BS+ Central nervous system: Alert and oriented. Non focal.  Extremities: Symmetric 5 x 5 power. No cyanosis or clubbing.  Skin: No rashes, lesions or ulcers Psychiatry:Mood & affect appropriate.     Data Reviewed: I have personally reviewed following labs and imaging studies  CBC:  Recent Labs Lab 01/03/17 2003 01/04/17 0626 01/06/17 1445 01/09/17 1051  WBC 11.1* 10.1 10.0 19.1*  NEUTROABS 9.4*  --  9.4* 16.0*  HGB 12.3 11.6* 12.3 11.9*  HCT 36.8 34.5* 36.9 35.2*  MCV 88.2 88.2 88.5 88.0  PLT 276 265 282 518   Basic Metabolic Panel:  Recent Labs Lab 01/03/17 2003 01/04/17 0626 01/06/17 1445 01/09/17 1051  NA 132* 132* 134* 132*  K 4.9 4.0 3.9 3.5  CL 94* 98* 97* 96*  CO2 26 24 26 27   GLUCOSE 117* 137* 143* 89  BUN 17 17 23* 25*  CREATININE 1.07* 0.99 0.96 1.03*  CALCIUM 9.3 8.9 9.2 8.2*  MG  --  1.7  --   --   PHOS  --  2.7  --   --    GFR: Estimated Creatinine Clearance: 35.1 mL/min (A) (by C-G formula based on SCr of 1.03 mg/dL (H)). Liver Function Tests:  Recent Labs Lab 01/03/17 2003 01/04/17 0626  AST 42* 21  ALT 12* 14  ALKPHOS 96 94  BILITOT 1.7* 0.8  PROT 7.7 7.1  ALBUMIN 4.4 4.0   No results for input(s): LIPASE, AMYLASE in the last 168 hours. No results for input(s): AMMONIA in the last 168  hours. Coagulation Profile:  Recent Labs Lab 01/03/17 2003 01/04/17 0626 01/06/17 1445 01/07/17 0452 01/08/17 1017  INR 1.82 2.81 2.80 2.75 2.64   Cardiac Enzymes: No results for input(s): CKTOTAL, CKMB, CKMBINDEX, TROPONINI in the last 168 hours. BNP (last 3 results) No results for input(s): PROBNP in the last 8760 hours. HbA1C: No results for input(s): HGBA1C in the last 72 hours. CBG: No results for input(s): GLUCAP in the last 168 hours. Lipid Profile: No results for input(s): CHOL, HDL, LDLCALC, TRIG, CHOLHDL, LDLDIRECT in the last 72 hours. Thyroid Function Tests: No results for input(s): TSH, T4TOTAL, FREET4, T3FREE, THYROIDAB in the last 72 hours. Anemia Panel: No results for input(s): VITAMINB12, FOLATE, FERRITIN, TIBC, IRON, RETICCTPCT in the last 72 hours. Sepsis Labs: No results for input(s): PROCALCITON, LATICACIDVEN in the last 168 hours.  No results found for this or any previous visit (from the past 240 hour(s)).       Radiology Studies: No results found.      Scheduled Meds: . acetaminophen  650 mg Oral Q6H  . amLODipine  10 mg Oral q morning - 10a  . cloNIDine  0.1 mg Oral BID  . diclofenac sodium  2 g Topical QID  . furosemide  40 mg Oral Daily  . irbesartan  300 mg Oral q morning - 10a  . lidocaine  1 patch Transdermal Q24H  . methimazole  5 mg Oral Daily  . pantoprazole  40 mg Oral Daily  . polyethylene glycol  17 g Oral Daily  . potassium chloride SA  20 mEq Oral q morning - 10a  . pravastatin  40 mg Oral q morning - 10a  . senna-docusate  2 tablet Oral BID   Continuous Infusions:   LOS: 1 day    Time spent: 50 min    Tema Alire, MD [facility] Pager [phone]  If 7PM-7AM, please contact night-coverage [email or web address] Password [email or web address] 01/09/2017, 5:54 PM

## 2017-01-10 LAB — CBC WITH DIFFERENTIAL/PLATELET
BASOS ABS: 0 10*3/uL (ref 0.0–0.1)
BASOS PCT: 0 %
Eosinophils Absolute: 0.2 10*3/uL (ref 0.0–0.7)
Eosinophils Relative: 1 %
HEMATOCRIT: 36.3 % (ref 36.0–46.0)
Hemoglobin: 12.3 g/dL (ref 12.0–15.0)
Lymphocytes Relative: 10 %
Lymphs Abs: 1.7 10*3/uL (ref 0.7–4.0)
MCH: 30.1 pg (ref 26.0–34.0)
MCHC: 33.9 g/dL (ref 30.0–36.0)
MCV: 89 fL (ref 78.0–100.0)
MONO ABS: 1.1 10*3/uL — AB (ref 0.1–1.0)
Monocytes Relative: 7 %
NEUTROS ABS: 13.2 10*3/uL — AB (ref 1.7–7.7)
NEUTROS PCT: 82 %
Platelets: 270 10*3/uL (ref 150–400)
RBC: 4.08 MIL/uL (ref 3.87–5.11)
RDW: 15.1 % (ref 11.5–15.5)
WBC: 16.1 10*3/uL — AB (ref 4.0–10.5)

## 2017-01-10 LAB — PROTIME-INR
INR: 1.48
Prothrombin Time: 17.8 seconds — ABNORMAL HIGH (ref 11.4–15.2)

## 2017-01-10 MED ORDER — WARFARIN SODIUM 2 MG PO TABS: ORAL_TABLET | ORAL | 0 refills | Status: DC

## 2017-01-10 MED ORDER — POLYETHYLENE GLYCOL 3350 17 G PO PACK: 17.0000 g | PACK | Freq: Every day | ORAL | 0 refills | Status: DC | PRN

## 2017-01-10 MED ORDER — TRAMADOL HCL 50 MG PO TABS: 100.0000 mg | ORAL_TABLET | Freq: Four times a day (QID) | ORAL | Status: DC | PRN

## 2017-01-10 MED ORDER — TRAMADOL HCL 50 MG PO TABS: 100.0000 mg | ORAL_TABLET | Freq: Four times a day (QID) | ORAL | 0 refills | Status: DC | PRN

## 2017-01-10 MED ORDER — SENNOSIDES-DOCUSATE SODIUM 8.6-50 MG PO TABS: 2.0000 | ORAL_TABLET | Freq: Two times a day (BID) | ORAL | 0 refills | Status: DC

## 2017-01-10 NOTE — Care Management Note (Signed)
Case Management Note  Patient Details  Name: [name] MRN: [id number] Date of Birth: 06-07-1925  Subjective/Objective:83 y/o f admitted w/intractable pain. No CM needs.                   Action/Plan:d/c home.   Expected Discharge Date:  01/10/17               Expected Discharge Plan:  Home/Self Care  In-House Referral:     Discharge planning Services  CM Consult  Post Acute Care Choice:    Choice offered to:     DME Arranged:    DME Agency:     HH Arranged:    HH Agency:     Status of Service:  Completed, signed off  If discussed at [employer] of Stay Meetings, dates discussed:    Additional Comments:  Dessa Phi, RN 01/10/2017, 2:29 PM

## 2017-01-11 ENCOUNTER — Ambulatory Visit (INDEPENDENT_AMBULATORY_CARE_PROVIDER_SITE_OTHER): Payer: Medicare Other | Admitting: Pharmacist

## 2017-01-11 DIAGNOSIS — M5030 Other cervical disc degeneration, unspecified cervical region: Secondary | ICD-10-CM | POA: Diagnosis not present

## 2017-01-11 DIAGNOSIS — M542 Cervicalgia: Secondary | ICD-10-CM | POA: Diagnosis not present

## 2017-01-11 DIAGNOSIS — Z7901 Long term (current) use of anticoagulants: Secondary | ICD-10-CM | POA: Diagnosis not present

## 2017-01-11 LAB — POCT INR: INR: 1.3

## 2017-01-17 ENCOUNTER — Telehealth: Payer: Self-pay | Admitting: Internal Medicine

## 2017-01-17 NOTE — Telephone Encounter (Signed)
Call placed to Pt.  Per Pt, she was recently hospitalized but she is not taking furosemide.  She continues to take maxzide until she sees Dr. Lovena Le on Wednesday.

## 2017-01-17 NOTE — Telephone Encounter (Signed)
Pt's pharmacy requesting a refill on Furosemide 20 mg tablet taken 2 tablets daily. Would you like to refill this medication? Please advise

## 2017-01-19 ENCOUNTER — Encounter: Payer: Self-pay | Admitting: Internal Medicine

## 2017-01-19 ENCOUNTER — Ambulatory Visit (INDEPENDENT_AMBULATORY_CARE_PROVIDER_SITE_OTHER): Payer: Medicare Other | Admitting: Internal Medicine

## 2017-01-19 ENCOUNTER — Encounter (INDEPENDENT_AMBULATORY_CARE_PROVIDER_SITE_OTHER): Payer: Self-pay

## 2017-01-19 VITALS — BP 100/56 | HR 60 | Ht 62.0 in | Wt 152.0 lb

## 2017-01-19 DIAGNOSIS — Z950 Presence of cardiac pacemaker: Secondary | ICD-10-CM

## 2017-01-19 DIAGNOSIS — I4891 Unspecified atrial fibrillation: Secondary | ICD-10-CM | POA: Diagnosis not present

## 2017-01-19 MED ORDER — TRIAMTERENE-HCTZ 37.5-25 MG PO CAPS: ORAL_CAPSULE | ORAL | 12 refills | Status: DC

## 2017-01-19 NOTE — Patient Instructions (Addendum)
Medication Instructions:  Your physician recommends that you continue on your current medications as directed. Please refer to the Current Medication list given to you today. Continue taking triamterene-HCTZ for edema/sob.    Labwork: None ordered.  Testing/Procedures: None ordered.  Follow-Up: You will follow up with the device clinic in 6 months for a device check.  Your physician wants you to follow-up in: one year with Dr. Lovena Le.   You will receive a reminder letter in the mail two months in advance. If you don't receive a letter, please call our office to schedule the follow-up appointment.   Any Other Special Instructions Will Be Listed Below (If Applicable).  Call us if you are having any dizziness.  Sonia Baller RN [phone]   If you need a refill on your cardiac medications before your next appointment, please call your pharmacy.

## 2017-01-19 NOTE — Progress Notes (Signed)
HPI [name] returns today for ongoing evaluation and management of chronic diastolic heart failure, hypertension, symptomatic bradycardia, status post pacemaker insertion. The patient has had problems with peripheral edema. We initially tried diuretic therapy with furosemide that she was intolerant of this. The patient has been on combination hydrochlorothiazide and triamterene recently. Her peripheral edema has improved. Today she denied chest pain or shortness of breath. Allergies  Allergen Reactions  . Morphine And Related Nausea And Vomiting     Current Outpatient Prescriptions  Medication Sig Dispense Refill  . acetaminophen (TYLENOL) 325 MG tablet Take 650 mg by mouth every 6 (six) hours as needed (pain).    Marland Kitchen amLODipine (NORVASC) 10 MG tablet Take 10 mg by mouth every morning.     . clonazePAM (KLONOPIN) 0.5 MG tablet Take 0.5 mg by mouth daily as needed for anxiety.     . cloNIDine (CATAPRES) 0.1 MG tablet Take 0.1 mg by mouth 2 (two) times daily.     . cyclobenzaprine (FLEXERIL) 5 MG tablet Take 1 tablet (5 mg total) by mouth 3 (three) times daily as needed for muscle spasms. 15 tablet 0  . irbesartan (AVAPRO) 300 MG tablet Take 300 mg by mouth every morning.    . methimazole (TAPAZOLE) 5 MG tablet TAKE ONE TABLET BY MOUTH DAILY WITH FOOD  0  . polyethylene glycol (MIRALAX / GLYCOLAX) packet Take 17 g by mouth daily as needed. 14 each 0  . potassium chloride SA (K-DUR,KLOR-CON) 20 MEQ tablet Take 20 mEq by mouth every morning.     . pravastatin (PRAVACHOL) 40 MG tablet Take 40 mg by mouth every morning.     Marland Kitchen PREVIDENT 5000 PLUS 1.1 % CREA dental cream Take 1 application by mouth at bedtime. As directed    . senna-docusate (SENOKOT-S) 8.6-50 MG tablet Take 2 tablets by mouth 2 (two) times daily. 30 tablet 0  . traMADol (ULTRAM) 50 MG tablet Take 2 tablets (100 mg total) by mouth every 6 (six) hours as needed for moderate pain. 10 tablet 0  . triamterene-hydrochlorothiazide  (DYAZIDE) 37.5-25 MG capsule TAKE 1 EACH (1 CAPSULE TOTAL) BY MOUTH DAILY.  6  . warfarin (COUMADIN) 2 MG tablet Take Coumadin for three weeks for postoperative protocol and then the patient may resume their previous Coumadin home regimen.  The dose may need to be adjusted based upon the INR.  Please follow the INR and titrate Coumadin dose for a therapeutic range between 2.0 and 3.0 INR.  After completing the three weeks of Coumadin, the patient may resume their previous Coumadin home regimen.   CURRENTLY HOLDING THE COUMADIN FOR THE CORTISONE INJECTION 30 tablet 0  . furosemide (LASIX) 20 MG tablet Take 2 tablets (40 mg total) by mouth daily. 14 tablet 0   No current facility-administered medications for this visit.      Past Medical History:  Diagnosis Date  . Anxiety   . Atrial fibrillation ([place])   . Bradycardia   . Complication of anesthesia   . Difficulty sleeping   . Dysrhythmia    A FIB  . H/O hiatal hernia   . History of kidney stones   . History of skin cancer   . History of transfusion   . Hypertension   . Osteoarthritis   . Pacemaker   . Pneumonia   . PONV (postoperative nausea and vomiting)   . Shortness of breath    WITH EXERTION    ROS:   All systems reviewed  and negative except as noted in the HPI.   Past Surgical History:  Procedure Laterality Date  . APPENDECTOMY  1960  . ARTHROPLASTY  1994   Left total knee  . ARTHROPLASTY  2000   Total right knee  . BACK SURGERY    . CHOLECYSTECTOMY    . DG SELECTED [facility] ONLY  2002   Dilation  . JOINT REPLACEMENT     knee replacement  . Microdiskectomy  05/2004   Left L4-L5  . PACEMAKER INSERTION    . TOTAL ABDOMINAL HYSTERECTOMY  1969  . TOTAL HIP ARTHROPLASTY Right 12/24/2013   Procedure: RIGHT TOTAL HIP ARTHROPLASTY ANTERIOR APPROACH;  Surgeon: Gearlean Alf, MD;  Location: [facility];  Service: Orthopedics;  Laterality: Right;     Family History  Problem Relation Age of Onset  . Heart disease  Mother   . Leukemia Father      Social History   Social History  . Marital status: Married    Spouse name: N/A  . Number of children: N/A  . Years of education: N/A   Occupational History  . Not on file.   Social History Main Topics  . Smoking status: Former Smoker    Quit date: 12/18/1973  . Smokeless tobacco: Former Systems developer     Comment: Smoker for 25 years and quit at around age 62  . Alcohol use No  . Drug use: No  . Sexual activity: Not on file   Other Topics Concern  . Not on file   Social History Narrative   Married, husband's name is Warner Mccreedy, they have been married since 34   2 sons and 4 grandchildren           BP (!) 100/56   Pulse 60   Ht 5\' 2"  (1.575 m)   Wt 152 lb (68.9 kg)   BMI 27.80 kg/m   Physical Exam:  Well appearing 83 year old woman, NAD HEENT: Unremarkable Neck:  6 cm JVD, no thyromegally Lymphatics:  No adenopathy Back:  No CVA tenderness Lungs:  Clear, with no wheezes, rales, or rhonchi. HEART:  Regular rate rhythm, no murmurs, no rubs, no clicks Abd:  soft, positive bowel sounds, no organomegally, no rebound, no guarding Ext:  2 plus pulses, no edema, no cyanosis, no clubbing Skin:  No rashes no nodules Neuro:  CN II through XII intact, motor grossly intact  EKG - atrial fibrillation with ventricular pacing  DEVICE  Normal device function.  See PaceArt for details.   Assess/Plan: 1. Atrial fibrillation - her ventricular rate appears to be well-controlled. She will continue her current medications. 2. Chronic diastolic heart failure - her volume overload is improved and she appears to be tolerating her current diuretic therapy. She will continue as above. She is encouraged to maintain a low-sodium diet. 3. Pacemaker - her Franklin single-chamber pacemaker is working normally. She is pacing approximately 69% of the time.  Cristopher Peru, M.D.

## 2017-01-19 NOTE — Discharge Summary (Signed)
Physician Discharge Summary  [name] AJO:[id number] DOB: 10/20/1925 DOA: 01/06/2017  PCP: [name], MD  Admit date: 01/06/2017 Discharge date: 01/10/2017  Admitted From: Home.  Disposition:  Home.   Recommendations for Outpatient Follow-up:  1. Follow up with PCP in 1-2 weeks 2. Please obtain BMP/CBC in one week Please follow up with orthopedics as recommended.   Discharge Condition:*stable.  CODE STATUS:full code.  Diet recommendation: Heart Healthy   Brief/Interim Summary: [name] a 83 y.o.femalewith medical history significant of atrial fibrillation, bradycardia, pacemaker, hypertension, hypothyroidism, and other medical problems presenting with headache and left-sided neck pain.   Discharge Diagnoses:  Active Problems:   Essential hypertension   ATRIAL FIBRILLATION   GERD   Cardiac pacemaker in situ   Intractable headache  Left sided neck pain;  Pt reports chronic cervical spondylopathy and uses cortisone injections once every 6 months and tells [place] its time for injections.  She was started on IV pain meds with some relief.  Requested IR and her orthopedics to see if she can get cervical nerve root block injection CT cervical spine ordered, done but results are pending.  Her pain not well controlled with oral meds and requiring IV morphine for pain control.  Reached out to  Dr Nelva Bush her orthopedics physician who does her cortisone injections, recomemnded outpatient follow up for the cortisone injections.    Leukocytosis: Unclear etiology. Probably reactive.    Headache from the left sided neck pain.  Pain control.  Discharge home once pain is controlled.     Atrial fibrillation:  Rate controlled.  Anticoagulation on hold so she can get the cortisone injections.     Hypertension: controlled.    GERD; stable.     Mild hyponatremia    Improved.     Discharge Instructions  Discharge Instructions     Diet - low sodium heart healthy    Complete by:  As directed      Allergies as of 01/10/2017      Reactions   Morphine And Related Nausea And Vomiting      Medication List    TAKE these medications   acetaminophen 325 MG tablet Commonly known as:  TYLENOL Take 650 mg by mouth every 6 (six) hours as needed (pain).   amLODipine 10 MG tablet Commonly known as:  NORVASC Take 10 mg by mouth every morning.   clonazePAM 0.5 MG tablet Commonly known as:  KLONOPIN Take 0.5 mg by mouth daily as needed for anxiety.   cloNIDine 0.1 MG tablet Commonly known as:  CATAPRES Take 0.1 mg by mouth 2 (two) times daily.   cyclobenzaprine 5 MG tablet Commonly known as:  FLEXERIL Take 1 tablet (5 mg total) by mouth 3 (three) times daily as needed for muscle spasms.   furosemide 20 MG tablet Commonly known as:  LASIX Take 2 tablets (40 mg total) by mouth daily.   irbesartan 300 MG tablet Commonly known as:  AVAPRO Take 300 mg by mouth every morning.   methimazole 5 MG tablet Commonly known as:  TAPAZOLE TAKE ONE TABLET BY MOUTH DAILY WITH FOOD   pantoprazole 40 MG tablet Commonly known as:  PROTONIX Take 1 tablet (40 mg total) by mouth daily.   polyethylene glycol packet Commonly known as:  MIRALAX / GLYCOLAX Take 17 g by mouth daily as needed.   potassium chloride SA 20 MEQ tablet Commonly known as:  K-DUR,KLOR-CON Take 20 mEq by mouth every morning.   pravastatin 40 MG  tablet Commonly known as:  PRAVACHOL Take 40 mg by mouth every morning.   predniSONE 10 MG tablet Commonly known as:  DELTASONE Label  & dispense according to the schedule below. 6 Pills PO on day one then, 5 Pills PO on day two, 4 Pills PO on day three, 3Pills PO on day four, 2 Pills PO on day five, 1 Pills PO on day six,  then STOP.  Total of 21 tabs   PREVIDENT 5000 PLUS 1.1 % Crea dental cream Generic drug:  sodium fluoride Take 1 application by mouth at bedtime. As directed   senna-docusate 8.6-50 MG  tablet Commonly known as:  Senokot-S Take 2 tablets by mouth 2 (two) times daily.   traMADol 50 MG tablet Commonly known as:  ULTRAM Take 2 tablets (100 mg total) by mouth every 6 (six) hours as needed for moderate pain.   triamterene-hydrochlorothiazide 37.5-25 MG capsule Commonly known as:  DYAZIDE TAKE 1 EACH (1 CAPSULE TOTAL) BY MOUTH DAILY.   warfarin 2 MG tablet Commonly known as:  COUMADIN Take Coumadin for three weeks for postoperative protocol and then the patient may resume their previous Coumadin home regimen.  The dose may need to be adjusted based upon the INR.  Please follow the INR and titrate Coumadin dose for a therapeutic range between 2.0 and 3.0 INR.  After completing the three weeks of Coumadin, the patient may resume their previous Coumadin home regimen.   CURRENTLY HOLDING THE COUMADIN FOR THE CORTISONE INJECTION What changed:  how much to take  how to take this  when to take this  additional instructions      Follow-up Information    Tisovec, Fransico Him, MD. Schedule an appointment as soon as possible for a visit in 1 week(s).   Specialty:  Internal Medicine Contact information: [place] [address] [phone]        Suella Broad, MD Follow up on 01/11/2017.   Specialty:  Physical Medicine and Rehabilitation Contact information: [address] [place] [address] [place] [address] [phone]          Allergies  Allergen Reactions  . Morphine And Related Nausea And Vomiting    Consultations:  IR   Procedures/Studies: Ct Angio Head W Or Wo Contrast  Result Date: 01/03/2017 CLINICAL DATA:  Initial evaluation for left-sided neck pain. EXAM: CT ANGIOGRAPHY HEAD AND NECK TECHNIQUE: Multidetector CT imaging of the head and neck was performed using the standard protocol during bolus administration of intravenous contrast. Multiplanar CT image reconstructions and MIPs were obtained to evaluate the vascular anatomy.  Carotid stenosis measurements (when applicable) are obtained utilizing NASCET criteria, using the distal internal carotid diameter as the denominator. CONTRAST:  75 cc of Isovue 370. COMPARISON:  Prior CT from 09/24/2015. FINDINGS: CT HEAD FINDINGS Brain: Age-related cerebral atrophy with mild chronic small vessel ischemic disease. No acute intracranial hemorrhage. No evidence for acute large vessel territory infarct. No mass lesion, midline shift or mass effect. No hydrocephalus. No extra-axial fluid collection. Vascular: No hyperdense vessel. Scattered vascular calcifications noted within the carotid siphons. Skull: Scalp soft tissues and calvarium within normal limits. Sinuses: Paranasal sinuses and mastoid air cells are clear. Orbits: Globes and orbital soft tissues within normal limits. Review of the MIP images confirms the above findings CTA NECK FINDINGS Aortic arch: Visualized aortic arch of normal caliber with normal 3 vessel morphology. Mild scattered plaque within the arch itself and about the origin of the great vessels without flow-limiting stenosis. Visualized subclavian  artery is widely patent. Right carotid system: Right common carotid artery patent from its origin to the bifurcation without stenosis. Mild a centric calcified plaque about the right bifurcation without stenosis. Right ICA patent distally to the skullbase without stenosis, dissection, or occlusion. Left carotid system: Left common carotid artery patent from its origin to the bifurcation without stenosis. A centric calcified plaque about the left bifurcation without flow-limiting stenosis. Left ICA patent distally to the skullbase without stenosis, dissection, or occlusion. Vertebral arteries: Both of the vertebral arteries arise from the subclavian arteries. Vertebral arteries widely patent within the neck without stenosis, dissection, or occlusion. Skeleton: No acute osseus abnormality. No worrisome lytic or blastic osseous lesions.  Grade 1 anterolisthesis of C4 on C5, likely due to chronic facet degeneration. A advanced degenerative spondylolysis present at C5-6 and C6-7. Other neck: Soft tissues of the neck demonstrate no acute abnormality. Salivary glands within normal limits. Enlarged thyroid cord or noted. 1.4 x 1.7 cm focal outpouching containing air and fluid extends from the left lateral aspect of the upper esophagus, consistent with an esophageal diverticulum. Intraluminal fluid density present distally within the visualized esophageal lumen. Upper chest: Visualized upper chest demonstrates no acute abnormality. Partially visualized lungs are grossly clear. Review of the MIP images confirms the above findings CTA HEAD FINDINGS Anterior circulation: Petrous segments widely patent bilaterally. Scattered calcified atheromatous plaque within the cavernous/ supraclinoid ICAs without flow-limiting stenosis. ICA termini widely patent. A1 segments patent bilaterally. Anterior communicating artery normal. Anterior cerebral arteries patent to their distal aspects without stenosis. M1 segments patent without stenosis or occlusion. Normal MCA bifurcations. No proximal M2 occlusion. Distal MCA branches well perfused and symmetric. Posterior circulation: Focal non stenotic plaque noted within the left V4 segment as it crosses into the cranial vault. Vertebral arteries otherwise widely patent to the vertebrobasilar junction. Posterior inferior cerebral arteries patent bilaterally. Basilar artery widely patent to its distal aspect. Superior cerebral arteries patent bilaterally. Posterior cerebral arteries well perfused and widely patent to their distal aspects without stenosis. Prominent right posterior communicating artery noted. Venous sinuses: Patent. Anatomic variants: None significant. No aneurysm or vascular malformation. Delayed phase: No abnormal enhancement. Review of the MIP images confirms the above findings IMPRESSION: 1. No acute vascular  abnormality involving the major arterial vasculature of the head and neck. 2. Mild atheromatous disease for patient age without flow-limiting stenosis. 3. Thyroid goiter. 4. Esophageal diverticulum Electronically Signed   By: Jeannine Boga M.D.   On: 01/03/2017 22:17   Dg Cervical Spine 2-3 Views  Result Date: 01/03/2017 CLINICAL DATA:  Awakened this morning EXAM: CERVICAL SPINE - 2-3 VIEW COMPARISON:  Coronal and sagittal reconstructed images from a head and neck CT scan dated 01/24/2016. FINDINGS: The cervical vertebral bodies are preserved in height. There is 4 mm of anterolisthesis of C4 with respect to C5. There is moderate degenerative disc space narrowing at C5-6 and C6-7 with large anterior endplate osteophytes. There is multilevel facet joint hypertrophy but no perched facet is observed. The spinous processes are intact. The odontoid is intact where visualized. The prevertebral soft tissue spaces are normal. IMPRESSION: Degenerative disc disease centered at C5-6 and C6-7. Grade 1 anterolisthesis of C4 with respect to C5 likely on the basis of facet joint degenerative change. No compression fracture. Electronically Signed   By: David  Martinique M.D.   On: 01/03/2017 17:13   Ct Angio Neck W And/or Wo Contrast  Result Date: 01/03/2017 CLINICAL DATA:  Initial evaluation for left-sided neck pain. EXAM: CT  ANGIOGRAPHY HEAD AND NECK TECHNIQUE: Multidetector CT imaging of the head and neck was performed using the standard protocol during bolus administration of intravenous contrast. Multiplanar CT image reconstructions and MIPs were obtained to evaluate the vascular anatomy. Carotid stenosis measurements (when applicable) are obtained utilizing NASCET criteria, using the distal internal carotid diameter as the denominator. CONTRAST:  75 cc of Isovue 370. COMPARISON:  Prior CT from 09/24/2015. FINDINGS: CT HEAD FINDINGS Brain: Age-related cerebral atrophy with mild chronic small vessel ischemic disease.  No acute intracranial hemorrhage. No evidence for acute large vessel territory infarct. No mass lesion, midline shift or mass effect. No hydrocephalus. No extra-axial fluid collection. Vascular: No hyperdense vessel. Scattered vascular calcifications noted within the carotid siphons. Skull: Scalp soft tissues and calvarium within normal limits. Sinuses: Paranasal sinuses and mastoid air cells are clear. Orbits: Globes and orbital soft tissues within normal limits. Review of the MIP images confirms the above findings CTA NECK FINDINGS Aortic arch: Visualized aortic arch of normal caliber with normal 3 vessel morphology. Mild scattered plaque within the arch itself and about the origin of the great vessels without flow-limiting stenosis. Visualized subclavian artery is widely patent. Right carotid system: Right common carotid artery patent from its origin to the bifurcation without stenosis. Mild a centric calcified plaque about the right bifurcation without stenosis. Right ICA patent distally to the skullbase without stenosis, dissection, or occlusion. Left carotid system: Left common carotid artery patent from its origin to the bifurcation without stenosis. A centric calcified plaque about the left bifurcation without flow-limiting stenosis. Left ICA patent distally to the skullbase without stenosis, dissection, or occlusion. Vertebral arteries: Both of the vertebral arteries arise from the subclavian arteries. Vertebral arteries widely patent within the neck without stenosis, dissection, or occlusion. Skeleton: No acute osseus abnormality. No worrisome lytic or blastic osseous lesions. Grade 1 anterolisthesis of C4 on C5, likely due to chronic facet degeneration. A advanced degenerative spondylolysis present at C5-6 and C6-7. Other neck: Soft tissues of the neck demonstrate no acute abnormality. Salivary glands within normal limits. Enlarged thyroid cord or noted. 1.4 x 1.7 cm focal outpouching containing air and  fluid extends from the left lateral aspect of the upper esophagus, consistent with an esophageal diverticulum. Intraluminal fluid density present distally within the visualized esophageal lumen. Upper chest: Visualized upper chest demonstrates no acute abnormality. Partially visualized lungs are grossly clear. Review of the MIP images confirms the above findings CTA HEAD FINDINGS Anterior circulation: Petrous segments widely patent bilaterally. Scattered calcified atheromatous plaque within the cavernous/ supraclinoid ICAs without flow-limiting stenosis. ICA termini widely patent. A1 segments patent bilaterally. Anterior communicating artery normal. Anterior cerebral arteries patent to their distal aspects without stenosis. M1 segments patent without stenosis or occlusion. Normal MCA bifurcations. No proximal M2 occlusion. Distal MCA branches well perfused and symmetric. Posterior circulation: Focal non stenotic plaque noted within the left V4 segment as it crosses into the cranial vault. Vertebral arteries otherwise widely patent to the vertebrobasilar junction. Posterior inferior cerebral arteries patent bilaterally. Basilar artery widely patent to its distal aspect. Superior cerebral arteries patent bilaterally. Posterior cerebral arteries well perfused and widely patent to their distal aspects without stenosis. Prominent right posterior communicating artery noted. Venous sinuses: Patent. Anatomic variants: None significant. No aneurysm or vascular malformation. Delayed phase: No abnormal enhancement. Review of the MIP images confirms the above findings IMPRESSION: 1. No acute vascular abnormality involving the major arterial vasculature of the head and neck. 2. Mild atheromatous disease for patient age without flow-limiting  stenosis. 3. Thyroid goiter. 4. Esophageal diverticulum Electronically Signed   By: Jeannine Boga M.D.   On: 01/03/2017 22:17   Ct Cervical Spine W Contrast  Result Date:  01/10/2017 CLINICAL DATA:  Patient awoke with a crick in neck. EXAM: CT CERVICAL SPINE WITH CONTRAST TECHNIQUE: Multidetector CT imaging of the cervical spine was performed during intravenous contrast administration. Multiplanar CT image reconstructions were also generated. CONTRAST:  11mL ISOVUE-300 IOPAMIDOL (ISOVUE-300) INJECTION 61% COMPARISON:  CTA of the neck 01/03/2017. Cervical spine plain films 01/03/2017. FINDINGS: Alignment: 3 mm of degenerative anterolisthesis C4 on C5 is facet mediated. 2 mm of facet mediated anterolisthesis is present at C3-C4. Trace anterolisthesis C2-C3. Skull base and vertebrae: No fracture or worrisome osseous lesion. Severe facet arthropathy is most pronounced at C3-4 and C4-5. Soft tissues and spinal canal: Large goiter, described previously, 30 x 38 mm cross-section of the RIGHT lobe as seen on image 78 series 4. Visualized vessels are patent. Suspected 1.5 cm Zenker's diverticulum to the LEFT of midline, projecting laterally from the esophagus, at the cervicothoracic junction. Disc levels: C2-3: Trace anterolisthesis. Annular bulge. LEFT-sided uncinate hypertrophy and facet arthropathy result in LEFT C3 foraminal narrowing. C3-4: Annular bulge. Asymmetric uncinate hypertrophy and facet arthropathy to the LEFT in conjunction with 2 mm of slip, results in LEFT greater than RIGHT C4 foraminal narrowing. C4-5: Annular bulge. 3 mm slip. BILATERAL facet arthropathy and uncinate spurring contribute to C5 foraminal narrowing, worse on the LEFT. C5-6: Severe disc space narrowing. Calcified disc material to the LEFT versus OPLL. BILATERAL C6 foraminal narrowing. C6-7: Severe disc space narrowing. Central disc osteophyte complex with suspected small calcific protrusion versus OPLL. Mild stenosis. Borderline C7 foraminal narrowing. C7-T1: Severe BILATERAL facet arthropathy. No definite impingement. Upper chest: Substernal extension of goiter. No lung mass or pneumothorax. Atherosclerosis.  Other: None. IMPRESSION: Multilevel spondylosis. Degenerative slip as described, similar to priors. Potentially symptomatic foraminal narrowing at multiple levels. Calcified disc material and disc osteophyte complex (favored) versus mild OPLL, C5-6 and C6-7. Large goiter.  Correlate with the results of biopsy 03/07/2015. Electronically Signed   By: Staci Righter M.D.   On: 01/10/2017 13:56        Subjective: Pain better controlled.   Discharge Exam: Vitals:   01/09/17 2015 01/10/17 0458  BP: (!) 122/56 (!) 112/59  Pulse: 67 (!) 59  Resp: 18 17  Temp: 98.6 F (37 C) 98.2 F (36.8 C)  SpO2: 95% 98%   Vitals:   01/09/17 0512 01/09/17 1428 01/09/17 2015 01/10/17 0458  BP: 113/61 (!) 102/49 (!) 122/56 (!) 112/59  Pulse: 62 (!) 59 67 (!) 59  Resp:   18 17  Temp: 98.6 F (37 C) 98.5 F (36.9 C) 98.6 F (37 C) 98.2 F (36.8 C)  TempSrc: Oral Oral Oral Oral  SpO2: 97% 96% 95% 98%  Weight:      Height:        General: Pt is alert, awake, not in acute distress Cardiovascular: RRR, S1/S2 +, no rubs, no gallops Respiratory: CTA bilaterally, no wheezing, no rhonchi Abdominal: Soft, NT, ND, bowel sounds + Extremities: no edema, no cyanosis    The results of significant diagnostics from this hospitalization (including imaging, microbiology, ancillary and laboratory) are listed below for reference.     Microbiology: No results found for this or any previous visit (from the past 240 hour(s)).   Labs: BNP (last 3 results)  Recent Labs  06/07/16 2220  BNP 237.6*   Basic Metabolic Panel: No  results for input(s): NA, K, CL, CO2, GLUCOSE, BUN, CREATININE, CALCIUM, MG, PHOS in the last 168 hours. Liver Function Tests: No results for input(s): AST, ALT, ALKPHOS, BILITOT, PROT, ALBUMIN in the last 168 hours. No results for input(s): LIPASE, AMYLASE in the last 168 hours. No results for input(s): AMMONIA in the last 168 hours. CBC: No results for input(s): WBC, NEUTROABS, HGB,  HCT, MCV, PLT in the last 168 hours. Cardiac Enzymes: No results for input(s): CKTOTAL, CKMB, CKMBINDEX, TROPONINI in the last 168 hours. BNP: Invalid input(s): POCBNP CBG: No results for input(s): GLUCAP in the last 168 hours. D-Dimer No results for input(s): DDIMER in the last 72 hours. Hgb A1c No results for input(s): HGBA1C in the last 72 hours. Lipid Profile No results for input(s): CHOL, HDL, LDLCALC, TRIG, CHOLHDL, LDLDIRECT in the last 72 hours. Thyroid function studies No results for input(s): TSH, T4TOTAL, T3FREE, THYROIDAB in the last 72 hours.  Invalid input(s): FREET3 Anemia work up No results for input(s): VITAMINB12, FOLATE, FERRITIN, TIBC, IRON, RETICCTPCT in the last 72 hours. Urinalysis    Component Value Date/Time   COLORURINE YELLOW 05/13/2015 2000   APPEARANCEUR CLEAR 05/13/2015 2000   LABSPEC 1.008 05/13/2015 2000   PHURINE 7.5 05/13/2015 2000   GLUCOSEU NEGATIVE 05/13/2015 2000   HGBUR NEGATIVE 05/13/2015 2000   BILIRUBINUR NEGATIVE 05/13/2015 2000   KETONESUR NEGATIVE 05/13/2015 2000   PROTEINUR NEGATIVE 05/13/2015 2000   UROBILINOGEN 0.2 11/12/2014 1331   NITRITE NEGATIVE 05/13/2015 2000   LEUKOCYTESUR SMALL (A) 05/13/2015 2000   Sepsis Labs Invalid input(s): PROCALCITONIN,  WBC,  LACTICIDVEN Microbiology No results found for this or any previous visit (from the past 240 hour(s)).   Time coordinating discharge: Over 30 minutes  SIGNED:   Hosie Poisson, MD  [facility] 01/19/2017, 8:12 AM Pager   If 7PM-7AM, please contact night-coverage [email or web address] Password [email or web address]

## 2017-01-20 ENCOUNTER — Telehealth: Payer: Self-pay | Admitting: Internal Medicine

## 2017-01-20 DIAGNOSIS — M542 Cervicalgia: Secondary | ICD-10-CM | POA: Diagnosis not present

## 2017-01-20 DIAGNOSIS — M5030 Other cervical disc degeneration, unspecified cervical region: Secondary | ICD-10-CM | POA: Diagnosis not present

## 2017-01-20 NOTE — Telephone Encounter (Signed)
New message     Pt c/o BP issue: STAT if pt c/o blurred vision, one-sided weakness or slurred speech  1. What are your last 5 BP readings? 89/43 at the orthopedic office  2. Are you having any other symptoms (ex. Dizziness, headache, blurred vision, passed out)?  No just weakness  3. What is your BP issue?  Dr Lovena Le said if it continues to be low she will need to have medication change

## 2017-01-20 NOTE — Telephone Encounter (Signed)
Pt had low bp at orthopedic appt 89/43, was sitting still in w/c during visit.  Came home and was moving around, in/out of the bathroom and then took with her cuff: 128/61. She has appt with Dr. Osborne Casco at 11:45 tomorrow.  She will take her home cuff with her to verify accuracy.  Denies dizziness.  Reports tired and weak but pt thinks this is because of her head hurting so bad--her neck was injected at ortho.  Advised to change positions slowly.  Aware I am forwarding to Dr. Lovena Le and his nurse who are both out of office today and then she will be called back once reviewed.

## 2017-01-21 DIAGNOSIS — M50121 Cervical disc disorder at C4-C5 level with radiculopathy: Secondary | ICD-10-CM | POA: Diagnosis not present

## 2017-01-21 DIAGNOSIS — M542 Cervicalgia: Secondary | ICD-10-CM | POA: Diagnosis not present

## 2017-01-21 DIAGNOSIS — E0520 Thyrotoxicosis with toxic multinodular goiter without thyrotoxic crisis or storm: Secondary | ICD-10-CM | POA: Diagnosis not present

## 2017-01-21 DIAGNOSIS — I5032 Chronic diastolic (congestive) heart failure: Secondary | ICD-10-CM | POA: Diagnosis not present

## 2017-01-21 DIAGNOSIS — Z950 Presence of cardiac pacemaker: Secondary | ICD-10-CM | POA: Diagnosis not present

## 2017-01-21 DIAGNOSIS — I10 Essential (primary) hypertension: Secondary | ICD-10-CM | POA: Diagnosis not present

## 2017-01-21 DIAGNOSIS — I482 Chronic atrial fibrillation: Secondary | ICD-10-CM | POA: Diagnosis not present

## 2017-01-21 DIAGNOSIS — Z7901 Long term (current) use of anticoagulants: Secondary | ICD-10-CM | POA: Diagnosis not present

## 2017-01-21 DIAGNOSIS — Z6827 Body mass index (BMI) 27.0-27.9, adult: Secondary | ICD-10-CM | POA: Diagnosis not present

## 2017-01-21 NOTE — Telephone Encounter (Signed)
Detailed message left.  Notified Pt that one low BP is ok, when she rechecked it it was normal.  Notified Pt to call if she has any dizziness.  Pt has this nurse name and #.

## 2017-01-25 ENCOUNTER — Telehealth: Payer: Self-pay | Admitting: Internal Medicine

## 2017-01-25 NOTE — Telephone Encounter (Signed)
Tiffany ( granddaughter) is calling because [name] blood pressure is really low ( 83/49) has been like this for several days . She is experiencing dizziness, weakness and passing out. Dr.Taylor increased her Furosemide to 40 mg from 20 mg and was told if she was having any problems they would lower it back to the 20mg  . Please call

## 2017-01-25 NOTE — Telephone Encounter (Signed)
Call placed to Pt.  Per Pt, she is not taking furosemide.  Notified Pt that her granddaughter had called and said she was giving her 40 mg furosemide daily.  Pt again indicates she is not taking furosemide.  Spoke with Dr. Lovena Le, have Pt discontinue clonidine d/t low blood pressures.  Notified Pt of med change. VM left for [employer], granddaughter.  Notified that Pt should NOT be taking furosemide.  Pt on alternative fluid pill.  Notified that Pt should discontinue clonidine.  This nurse name and # left for any questions.  Will cont to monitor.

## 2017-01-28 DIAGNOSIS — Z7901 Long term (current) use of anticoagulants: Secondary | ICD-10-CM | POA: Diagnosis not present

## 2017-01-28 DIAGNOSIS — I482 Chronic atrial fibrillation: Secondary | ICD-10-CM | POA: Diagnosis not present

## 2017-01-28 DIAGNOSIS — K219 Gastro-esophageal reflux disease without esophagitis: Secondary | ICD-10-CM | POA: Diagnosis not present

## 2017-02-02 NOTE — Telephone Encounter (Signed)
Call placed [name] per DPR.  Checking back to see if Pt is feeling better since medications have been clarified.  Per Dominica Severin, family reviewed Pt medications and removed medications she should not be taking.  Per Dominica Severin Pt is now back to normal.  Thanked for this call.  No further action needed at this time.

## 2017-02-03 DIAGNOSIS — M47812 Spondylosis without myelopathy or radiculopathy, cervical region: Secondary | ICD-10-CM | POA: Diagnosis not present

## 2017-02-09 DIAGNOSIS — Z7901 Long term (current) use of anticoagulants: Secondary | ICD-10-CM | POA: Diagnosis not present

## 2017-02-09 DIAGNOSIS — R110 Nausea: Secondary | ICD-10-CM | POA: Diagnosis not present

## 2017-02-09 DIAGNOSIS — I482 Chronic atrial fibrillation: Secondary | ICD-10-CM | POA: Diagnosis not present

## 2017-02-10 LAB — CUP PACEART INCLINIC DEVICE CHECK
Battery Voltage: 2.76 V
Brady Statistic RV Percent Paced: 69 %
Implantable Pulse Generator Implant Date: 20070501
Lead Channel Setting Pacing Pulse Width: 0.4 ms
MDC IDC LEAD IMPLANT DT: 20070501
MDC IDC LEAD LOCATION: 753860
MDC IDC MSMT BATTERY IMPEDANCE: 2000 Ohm
MDC IDC MSMT LEADCHNL RV IMPEDANCE VALUE: 388 Ohm
MDC IDC SESS DTM: 20181024144408
MDC IDC SET LEADCHNL RV SENSING SENSITIVITY: 2 mV
Pulse Gen Serial Number: 1664242

## 2017-02-16 DIAGNOSIS — I482 Chronic atrial fibrillation: Secondary | ICD-10-CM | POA: Diagnosis not present

## 2017-02-16 DIAGNOSIS — K219 Gastro-esophageal reflux disease without esophagitis: Secondary | ICD-10-CM | POA: Diagnosis not present

## 2017-02-16 DIAGNOSIS — Z7901 Long term (current) use of anticoagulants: Secondary | ICD-10-CM | POA: Diagnosis not present

## 2017-02-16 DIAGNOSIS — R110 Nausea: Secondary | ICD-10-CM | POA: Diagnosis not present

## 2017-02-21 ENCOUNTER — Encounter: Payer: Self-pay | Admitting: Gastroenterology

## 2017-03-16 DIAGNOSIS — I482 Chronic atrial fibrillation: Secondary | ICD-10-CM | POA: Diagnosis not present

## 2017-03-16 DIAGNOSIS — Z7901 Long term (current) use of anticoagulants: Secondary | ICD-10-CM | POA: Diagnosis not present

## 2017-03-30 DIAGNOSIS — I495 Sick sinus syndrome: Secondary | ICD-10-CM | POA: Diagnosis not present

## 2017-04-07 DIAGNOSIS — Z96641 Presence of right artificial hip joint: Secondary | ICD-10-CM | POA: Diagnosis not present

## 2017-04-07 DIAGNOSIS — M25551 Pain in right hip: Secondary | ICD-10-CM | POA: Diagnosis not present

## 2017-04-12 ENCOUNTER — Ambulatory Visit: Payer: Medicare Other | Admitting: Gastroenterology

## 2017-04-12 DIAGNOSIS — I482 Chronic atrial fibrillation: Secondary | ICD-10-CM | POA: Diagnosis not present

## 2017-04-12 DIAGNOSIS — Z7901 Long term (current) use of anticoagulants: Secondary | ICD-10-CM | POA: Diagnosis not present

## 2017-04-14 ENCOUNTER — Encounter: Payer: Self-pay | Admitting: Physician Assistant

## 2017-04-14 ENCOUNTER — Telehealth: Payer: Self-pay | Admitting: Physician Assistant

## 2017-04-14 ENCOUNTER — Encounter (INDEPENDENT_AMBULATORY_CARE_PROVIDER_SITE_OTHER): Payer: Self-pay

## 2017-04-14 ENCOUNTER — Ambulatory Visit (INDEPENDENT_AMBULATORY_CARE_PROVIDER_SITE_OTHER): Payer: Medicare Other | Admitting: Physician Assistant

## 2017-04-14 VITALS — BP 120/54 | HR 56 | Ht 62.0 in | Wt 158.2 lb

## 2017-04-14 DIAGNOSIS — R110 Nausea: Secondary | ICD-10-CM

## 2017-04-14 DIAGNOSIS — K219 Gastro-esophageal reflux disease without esophagitis: Secondary | ICD-10-CM | POA: Diagnosis not present

## 2017-04-14 MED ORDER — ONDANSETRON 4 MG PO TBDP: 4.0000 mg | ORAL_TABLET | ORAL | 0 refills | Status: DC | PRN

## 2017-04-14 NOTE — Patient Instructions (Signed)
You have been scheduled for a Barium Esophogram at [facility]iology (1st floor of the hospital) on 04/25/17 at 9:15 am. Please arrive 15 minutes prior to your appointment for registration.  If you need to reschedule for any reason, please contact radiology at [phone] to do so. __________________________________________________________________ A barium swallow is an examination that concentrates on views of the esophagus. This tends to be a double contrast exam (barium and two liquids which, when combined, create a gas to distend the wall of the oesophagus) or single contrast (non-ionic iodine based). The study is usually tailored to your symptoms so a good history is essential. Attention is paid during the study to the form, structure and configuration of the esophagus, looking for functional disorders (such as aspiration, dysphagia, achalasia, motility and reflux) EXAMINATION You may be asked to change into a gown, depending on the type of swallow being performed. A radiologist and radiographer will perform the procedure. The radiologist will advise you of the type of contrast selected for your procedure and direct you during the exam. You will be asked to stand, sit or lie in several different positions and to hold a small amount of fluid in your mouth before being asked to swallow while the imaging is performed .In some instances you may be asked to swallow barium coated marshmallows to assess the motility of a solid food bolus. The exam can be recorded as a digital or video fluoroscopy procedure. POST PROCEDURE It will take 1-2 days for the barium to pass through your system. To facilitate this, it is important, unless otherwise directed, to increase your fluids for the next 24-48hrs and to resume your normal diet.  This test typically takes about 30 minutes to perform. __________________________________________________________________________________   [name] have been scheduled for an  Upper GI Series and Small Bowel Follow Thru at [facility]. Your appointment is on 04-25-17 at 9:15 am. Please arrive 15 minutes prior to your test for registration. Make certain not to have anything to eat or drink after midnight on the night before your test. If you need to reschedule, please contact radiology at [phone]. --------------------------------------------------------------------------------------------------------------- An upper GI series uses x rays to help diagnose problems of the upper GI tract, which includes the esophagus, stomach, and duodenum. The duodenum is the first part of the small intestine. An upper GI series is conducted by a radiology technologist or a radiologist-a doctor who specializes in x-ray imaging-at a hospital or outpatient center. While sitting or standing in front of an x-ray machine, the patient drinks barium liquid, which is often white and has a chalky consistency and taste. The barium liquid coats the lining of the upper GI tract and makes signs of disease show up more clearly on x rays. X-ray video, called fluoroscopy, is used to view the barium liquid moving through the esophagus, stomach, and duodenum. Additional x rays and fluoroscopy are performed while the patient lies on an x-ray table. To fully coat the upper GI tract with barium liquid, the technologist or radiologist may press on the abdomen or ask the patient to change position. Patients hold still in various positions, allowing the technologist or radiologist to take x rays of the upper GI tract at different angles. If a technologist conducts the upper GI series, a radiologist will later examine the images to look for problems.  This test typically takes about 1 hour to complete --------------------------------------------------------------------------------------------------------------------------------------------- The Small Bowel Follow Thru examination is used to visualize the  entire small bowel (intestines);  specifically the connection between the small and large intestine. You will be positioned on a flat x-ray table and an image of your abdomen taken. Then the technologist will show the x-ray to the radiologist. The radiologist will instruct your technologist how much (1-2 cups) barium sulfate you will drink and when to begin taking the timed x-rays, usually 15-30 minutes after you begin drinking. Barium is a harmless substance that will highlight your small intestine by absorbing x-ray. The taste is chalky and it feels very heavy both in the cup and in your stomach.  After the first x-ray is taken and shown to the radiologist, he/she will determine when the next image is to be taken. This is repeated until the barium has reached the end of the small intestine and enters the beginning of the colon (cecum). At such time when the barium spills into the colon, you will be positioned on the x-ray table once again. The radiologist will use a fluoroscopic camera to take some detailed pictures of the connection between your small intestine and colon. The fluoroscope is an x-ray unit that works with a television/computer screen. The radiologist will apply pressure to your abdomen with his/her hand and a lead glove, a plastic paddle, or a paddle with an inflated rubber balloon on the end. This is to spread apart your loops of intestine so he/she can see all areas.   This test typically takes around 1 hour to complete.  Important: Drink plenty of water (8-10 cups/day) for a few days following the procedure to avoid constipation and blockage. The barium will make your stools white for a few days. --------------------------------------------------------------------------------------------------------------------------------------------

## 2017-04-14 NOTE — Progress Notes (Addendum)
Chief Complaint: GERD, nausea  HPI:    [name] is an 84 year old Caucasian female with a past medical history of A. fib, status post pacemaker on Coumadin, as well as multiple others listed below, who was referred to me by Drinkard, Collie Siad, NP for a complaint of nausea and reflux.      According to referring physician's notes patient has failed PPI therapy with Pantoprazole 40 mg daily as well as H2 therapy with an added Zantac.They are requesting possible EGD.    Most recent labs were completed 01/21/17 and showed a CMP with a sodium minimally decreased at 131 and otherwise normal.  CBC was normal.  PT/INR was 2.9 a free T4 is 1.3.    Today, the patient presents to clinic accompanied by her granddaughter.  She explains that ever since being in the hospital in October for some neck pain she has had trouble with reflux.  Her granddaughter tells me it was even before then.  In the hospital, apparently patient was started on Pantoprazole.  She was discharged on this and tells me she continued taking it once daily.  This was not helping her reflux symptoms and Zantac was added.  She was on both these medications with no effect and eventually they were both discontinued.  Patient describes that she has intermittent reflux typically worse when she is lying down at night.  Associated with this is a feeling of nausea which can also occur by itself at night.  Sometimes the patient tells me that she eats something it will help her nausea.  Patient does not recall being on any anti-emetics in the past.  Currently she is just using Tums when she has either of these symptoms which "helps take the edge off".    Patient denies previous EGD.  She did have a colonoscopy per her report in 1999 with [facility].    Patient denies fever, chills, weight loss, anorexia, vomiting, change in bowel habits or abdominal pain.  Past Medical History:  Diagnosis Date  . Anxiety   . Atrial fibrillation ([place])   . Bradycardia   .  Complication of anesthesia   . Difficulty sleeping   . Dysrhythmia    A FIB  . H/O hiatal hernia   . History of kidney stones   . History of skin cancer   . History of transfusion   . Hypertension   . Osteoarthritis   . Pacemaker   . Pneumonia   . PONV (postoperative nausea and vomiting)   . Shortness of breath    WITH EXERTION    Past Surgical History:  Procedure Laterality Date  . APPENDECTOMY  1960  . ARTHROPLASTY  1994   Left total knee  . ARTHROPLASTY  2000   Total right knee  . BACK SURGERY    . CHOLECYSTECTOMY    . DG SELECTED [facility] ONLY  2002   Dilation  . Microdiskectomy  05/2004   Left L4-L5  . PACEMAKER INSERTION    . TOTAL ABDOMINAL HYSTERECTOMY  1969  . TOTAL HIP ARTHROPLASTY Right 12/24/2013   Procedure: RIGHT TOTAL HIP ARTHROPLASTY ANTERIOR APPROACH;  Surgeon: Gearlean Alf, MD;  Location: [facility];  Service: Orthopedics;  Laterality: Right;    Current Outpatient Medications  Medication Sig Dispense Refill  . acetaminophen (TYLENOL) 325 MG tablet Take 650 mg by mouth every 6 (six) hours as needed (pain).    Marland Kitchen amLODipine (NORVASC) 10 MG tablet Take 10 mg by mouth every morning.     Marland Kitchen  irbesartan (AVAPRO) 300 MG tablet Take 300 mg by mouth every morning.    . methimazole (TAPAZOLE) 5 MG tablet TAKE ONE TABLET BY MOUTH DAILY WITH FOOD  0  . potassium chloride SA (K-DUR,KLOR-CON) 20 MEQ tablet Take 20 mEq by mouth every morning.     . pravastatin (PRAVACHOL) 40 MG tablet Take 40 mg by mouth every morning.     Marland Kitchen PREVIDENT 5000 PLUS 1.1 % CREA dental cream Take 1 application by mouth at bedtime. As directed    . triamterene-hydrochlorothiazide (DYAZIDE) 37.5-25 MG capsule Take one capsule by mouth daily 30 capsule 12  . warfarin (COUMADIN) 2 MG tablet Take Coumadin for three weeks for postoperative protocol and then the patient may resume their previous Coumadin home regimen.  The dose may need to be adjusted based upon the INR.  Please follow the INR and  titrate Coumadin dose for a therapeutic range between 2.0 and 3.0 INR.  After completing the three weeks of Coumadin, the patient may resume their previous Coumadin home regimen.   CURRENTLY HOLDING THE COUMADIN FOR THE CORTISONE INJECTION (Patient taking differently: Take 1 tablet daily except Monday and Thursday (takes 2 tabs these days)) 30 tablet 0  . clonazePAM (KLONOPIN) 0.5 MG tablet Take 0.5 mg by mouth daily as needed for anxiety.     . cyclobenzaprine (FLEXERIL) 5 MG tablet Take 1 tablet (5 mg total) by mouth 3 (three) times daily as needed for muscle spasms. (Patient not taking: Reported on 04/14/2017) 15 tablet 0  . ondansetron (ZOFRAN ODT) 4 MG disintegrating tablet Take 1 tablet (4 mg total) by mouth every 4 (four) hours as needed for nausea or vomiting. 20 tablet 0  . polyethylene glycol (MIRALAX / GLYCOLAX) packet Take 17 g by mouth daily as needed. (Patient not taking: Reported on 04/14/2017) 14 each 0  . traMADol (ULTRAM) 50 MG tablet Take 2 tablets (100 mg total) by mouth every 6 (six) hours as needed for moderate pain. (Patient not taking: Reported on 04/14/2017) 10 tablet 0   No current facility-administered medications for this visit.     Allergies as of 04/14/2017 - Review Complete 04/14/2017  Allergen Reaction Noted  . Morphine and related Nausea And Vomiting 01/08/2012    Family History  Problem Relation Age of Onset  . Heart disease Mother   . Leukemia Father   . Colon cancer Neg Hx   . Esophageal cancer Neg Hx   . Stomach cancer Neg Hx   . Pancreatic cancer Neg Hx   . Liver disease Neg Hx     Social History   Socioeconomic History  . Marital status: Married    Spouse name: Not on file  . Number of children: Not on file  . Years of education: Not on file  . Highest education level: Not on file  Social Needs  . Financial resource strain: Not on file  . Food insecurity - worry: Not on file  . Food insecurity - inability: Not on file  . Transportation  needs - medical: Not on file  . Transportation needs - non-medical: Not on file  Occupational History  . Not on file  Tobacco Use  . Smoking status: Former Smoker    Last attempt to quit: 12/18/1973    Years since quitting: 43.3  . Smokeless tobacco: Former Systems developer  . Tobacco comment: Smoker for 25 years and quit at around age 60  Substance and Sexual Activity  . Alcohol use: No  . Drug use: No  .  Sexual activity: Not on file  Other Topics Concern  . Not on file  Social History Narrative   Married, husband's name is Warner Mccreedy, they have been married since 1950   2 sons and 4 grandchildren          Review of Systems:    Constitutional: No weight loss, fever or chills Skin: No rash  Cardiovascular: No chest pain  Respiratory: No SOB  Gastrointestinal: See HPI and otherwise negative Genitourinary: No dysuria  Neurological: No headache Musculoskeletal: No new muscle or joint pain Hematologic: No bleeding  Psychiatric: No history of depression or anxiety   Physical Exam:  Vital signs: BP (!) 120/54   Pulse (!) 56   Ht 5\' 2"  (1.575 m)   Wt 158 lb 3.2 oz (71.8 kg)   BMI 28.94 kg/m   Constitutional:   Very Pleasant Elderly Caucasian female appears to be in NAD, Well developed, Well nourished, alert and cooperative Head:  Normocephalic and atraumatic. Eyes:   PEERL, EOMI. No icterus. Conjunctiva pink. Ears:  Normal auditory acuity. Neck:  Supple Throat: Oral cavity and pharynx without inflammation, swelling or lesion.  Respiratory: Respirations even and unlabored. Lungs clear to auscultation bilaterally.   No wheezes, crackles, or rhonchi.  Cardiovascular: Normal S1, S2. No MRG. Regular rate and rhythm. No peripheral edema, cyanosis or pallor.  Gastrointestinal:  Soft, nondistended, nontender. No rebound or guarding. Normal bowel sounds. No appreciable masses or hepatomegaly. Rectal:  Not performed.  Msk:  Symmetrical without gross deformities. Without edema, no deformity or joint  abnormality.  Neurologic:  Alert and  oriented x4;  grossly normal neurologically.  Skin:   Dry and intact without significant lesions or rashes. Psychiatric: Demonstrates good judgement and reason without abnormal affect or behaviors.  RELEVANT LABS AND IMAGING: CBC    Component Value Date/Time   WBC 16.1 (H) 01/10/2017 1147   RBC 4.08 01/10/2017 1147   HGB 12.3 01/10/2017 1147   HCT 36.3 01/10/2017 1147   PLT 270 01/10/2017 1147   MCV 89.0 01/10/2017 1147   MCH 30.1 01/10/2017 1147   MCHC 33.9 01/10/2017 1147   RDW 15.1 01/10/2017 1147   LYMPHSABS 1.7 01/10/2017 1147   MONOABS 1.1 (H) 01/10/2017 1147   EOSABS 0.2 01/10/2017 1147   BASOSABS 0.0 01/10/2017 1147    CMP     Component Value Date/Time   NA 132 (L) 01/09/2017 1051   K 3.5 01/09/2017 1051   CL 96 (L) 01/09/2017 1051   CO2 27 01/09/2017 1051   GLUCOSE 89 01/09/2017 1051   BUN 25 (H) 01/09/2017 1051   CREATININE 1.03 (H) 01/09/2017 1051   CALCIUM 8.2 (L) 01/09/2017 1051   PROT 7.1 01/04/2017 0626   ALBUMIN 4.0 01/04/2017 0626   AST 21 01/04/2017 0626   ALT 14 01/04/2017 0626   ALKPHOS 94 01/04/2017 0626   BILITOT 0.8 01/04/2017 0626   GFRNONAA 47 (L) 01/09/2017 1051   GFRAA 54 (L) 01/09/2017 1051    Assessment: 1.  GERD: Uncontrolled over the past 3+ months, patient has been on Pantoprazole 40 mg with Zantac recently and this was not helping, not on either now 2.  Nausea: Worse over the past 3 months, seemingly ever since worsening neck pain per the patient, with above  Plan: 1.  Discussed with the patient that I would like to do a barium esophagram with upper GI series prior to likely EGD. 2.  Discussed an EGD.  Discussed risk, benefits, limitations alternatives and the patient agrees to  proceed if necessary. 3.  If proceed as above would likely recommend patient hold her Coumadin for 5 days prior to this procedure, pending recommendations from Dr. Carlean Purl.  Likely she would need a Lovenox bridge.  We  will need to communicate with her prescribing provider to ensure that this is acceptable for her. 4.  Prescribed Zofran 4 mg ODT, every 4-6 hours as needed for nausea. 5.  Discussed PPI or H2 blocker, but patient tells me these have not helped in the past.  Will wait until further evaluation. 6.  Patient to follow in clinic per recommendations from Dr. Lucia Bitter after testing above.  Ellouise Newer, PA-C [facility] 04/14/2017, 12:19 PM  Cc: Janus Molder, NP   Agree w/ evaluation as above.  Note that CT cervical spine suggests she has a Zenker's diverticulum and also has a chronic goiter (large). Not sure if these are related to sxs but esp Zenkers could be.  Gatha Mayer, MD, [facility]

## 2017-04-15 ENCOUNTER — Ambulatory Visit: Payer: Medicare Other | Admitting: Physician Assistant

## 2017-04-15 MED ORDER — ONDANSETRON HCL 4 MG PO TABS: 4.0000 mg | ORAL_TABLET | ORAL | 1 refills | Status: DC | PRN

## 2017-04-15 NOTE — Telephone Encounter (Signed)
Spoke to patient and informed her rx for regular zofran (not disintegrating tablets) has been called into the pharmacy. Patient expressed understanding.

## 2017-04-25 ENCOUNTER — Ambulatory Visit (HOSPITAL_COMMUNITY)
Admission: RE | Admit: 2017-04-25 | Discharge: 2017-04-25 | Disposition: A | Discharge status: Home / Self Care | Payer: Medicare Other | Source: Ambulatory Visit | Attending: Physician Assistant | Admitting: Physician Assistant

## 2017-04-25 DIAGNOSIS — K225 Diverticulum of esophagus, acquired: Secondary | ICD-10-CM | POA: Insufficient documentation

## 2017-04-25 DIAGNOSIS — K219 Gastro-esophageal reflux disease without esophagitis: Secondary | ICD-10-CM | POA: Diagnosis not present

## 2017-04-25 DIAGNOSIS — K228 Other specified diseases of esophagus: Secondary | ICD-10-CM | POA: Diagnosis not present

## 2017-04-25 DIAGNOSIS — R933 Abnormal findings on diagnostic imaging of other parts of digestive tract: Secondary | ICD-10-CM | POA: Insufficient documentation

## 2017-04-25 DIAGNOSIS — K449 Diaphragmatic hernia without obstruction or gangrene: Secondary | ICD-10-CM | POA: Insufficient documentation

## 2017-04-25 DIAGNOSIS — R110 Nausea: Secondary | ICD-10-CM | POA: Diagnosis not present

## 2017-04-27 DIAGNOSIS — Z6828 Body mass index (BMI) 28.0-28.9, adult: Secondary | ICD-10-CM | POA: Diagnosis not present

## 2017-04-27 DIAGNOSIS — I5032 Chronic diastolic (congestive) heart failure: Secondary | ICD-10-CM | POA: Diagnosis not present

## 2017-04-27 DIAGNOSIS — M1991 Primary osteoarthritis, unspecified site: Secondary | ICD-10-CM | POA: Diagnosis not present

## 2017-04-27 DIAGNOSIS — E0520 Thyrotoxicosis with toxic multinodular goiter without thyrotoxic crisis or storm: Secondary | ICD-10-CM | POA: Diagnosis not present

## 2017-04-27 DIAGNOSIS — M50121 Cervical disc disorder at C4-C5 level with radiculopathy: Secondary | ICD-10-CM | POA: Diagnosis not present

## 2017-04-27 DIAGNOSIS — E7800 Pure hypercholesterolemia, unspecified: Secondary | ICD-10-CM | POA: Diagnosis not present

## 2017-04-27 DIAGNOSIS — Z950 Presence of cardiac pacemaker: Secondary | ICD-10-CM | POA: Diagnosis not present

## 2017-04-27 DIAGNOSIS — E441 Mild protein-calorie malnutrition: Secondary | ICD-10-CM | POA: Diagnosis not present

## 2017-04-27 DIAGNOSIS — Z7901 Long term (current) use of anticoagulants: Secondary | ICD-10-CM | POA: Diagnosis not present

## 2017-04-27 DIAGNOSIS — I10 Essential (primary) hypertension: Secondary | ICD-10-CM | POA: Diagnosis not present

## 2017-04-27 DIAGNOSIS — I482 Chronic atrial fibrillation: Secondary | ICD-10-CM | POA: Diagnosis not present

## 2017-04-27 DIAGNOSIS — D692 Other nonthrombocytopenic purpura: Secondary | ICD-10-CM | POA: Diagnosis not present

## 2017-05-02 ENCOUNTER — Telehealth: Payer: Self-pay | Admitting: Physician Assistant

## 2017-05-02 NOTE — Telephone Encounter (Signed)
The pt was advised that [name] has been out of the office and will review her test when she returns and we will call her with further recommendations.

## 2017-05-04 NOTE — Progress Notes (Signed)
OK so needs an EGD dx is abnormal esophagus on xray and reflux  I think she is on warfarin though may not be - has Afib but warfarin on the med list says peri-operative and also that it is being held  If she is on warfarin needs to hold 5 days before the EGD

## 2017-05-10 DIAGNOSIS — I482 Chronic atrial fibrillation: Secondary | ICD-10-CM | POA: Diagnosis not present

## 2017-05-10 DIAGNOSIS — Z7901 Long term (current) use of anticoagulants: Secondary | ICD-10-CM | POA: Diagnosis not present

## 2017-05-16 ENCOUNTER — Other Ambulatory Visit: Payer: Self-pay

## 2017-05-16 MED ORDER — TRIAMTERENE-HCTZ 37.5-25 MG PO CAPS: ORAL_CAPSULE | ORAL | 2 refills | Status: DC

## 2017-05-17 ENCOUNTER — Telehealth: Payer: Self-pay | Admitting: Physician Assistant

## 2017-05-17 NOTE — Telephone Encounter (Signed)
Patient states she has been holding her medication prednisone due to Dr.Lucio telling her not to take it until having her procedure with [place]. Patient states she is having bad pain in her hip and wants to know if she takes medication if it will mess up procedure on 3.29.19.

## 2017-05-17 NOTE — Telephone Encounter (Signed)
The pt was advised she can take her prednisone.  She was reminded she will need the ok to hold coumadin however, the pt verbalized understanding

## 2017-05-26 ENCOUNTER — Telehealth: Payer: Self-pay

## 2017-05-26 NOTE — Telephone Encounter (Signed)
-----   Message from Jeoffrey Massed, RN sent at 05/05/2017  2:24 PM EST -----   ----- Message ----- From: Jeoffrey Massed, RN Sent: 05/05/2017   2:24 PM To: Jeoffrey Massed, RN  Waiting on anti coag response 06/24/17 appt for warfarin

## 2017-05-30 NOTE — Telephone Encounter (Signed)
Anti coag letter resent to Dr Osborne Casco.

## 2017-05-31 NOTE — Telephone Encounter (Signed)
Left message with Dr Loren Racer assistant to return call regarding coumadin hold

## 2017-05-31 NOTE — Telephone Encounter (Signed)
Charlene called from [facility] and gave me an alternate fax number to send the anti coag letter.[phone].  This was faxed per her request

## 2017-06-02 NOTE — Telephone Encounter (Signed)
Letter resent to Dr Osborne Casco

## 2017-06-06 NOTE — Telephone Encounter (Signed)
Response received from Dr Osborne Casco regarding coumadin hold.  She will have lovenox hold and appt has been scheduled for instructions.

## 2017-06-06 NOTE — Telephone Encounter (Signed)
Pt states she called Dr.Tiscove's office and was told we need to call them directly to receive her clearance for BT.

## 2017-06-06 NOTE — Telephone Encounter (Signed)
Left message on machine to call back at Dr Sonda Primes office to return call

## 2017-06-06 NOTE — Telephone Encounter (Signed)
The pt was called and asked if she has heard from Dr Osborne Casco regarding coumadin hold.  She has not and will call Dr Osborne Casco and get an answer and have them fax the response to our office.

## 2017-06-15 DIAGNOSIS — I482 Chronic atrial fibrillation: Secondary | ICD-10-CM | POA: Diagnosis not present

## 2017-06-15 DIAGNOSIS — R110 Nausea: Secondary | ICD-10-CM | POA: Diagnosis not present

## 2017-06-15 DIAGNOSIS — Z7901 Long term (current) use of anticoagulants: Secondary | ICD-10-CM | POA: Diagnosis not present

## 2017-06-16 ENCOUNTER — Ambulatory Visit (AMBULATORY_SURGERY_CENTER): Payer: Self-pay | Admitting: *Deleted

## 2017-06-16 ENCOUNTER — Other Ambulatory Visit: Payer: Self-pay

## 2017-06-16 VITALS — Ht 62.0 in | Wt 155.0 lb

## 2017-06-16 DIAGNOSIS — K219 Gastro-esophageal reflux disease without esophagitis: Secondary | ICD-10-CM

## 2017-06-16 NOTE — Progress Notes (Signed)
No egg or soy allergy known to patient  No issues with past sedation with any surgeries  or procedures, no intubation problems  No diet pills per patient No home 02 use per patient   blood thinners per patient - pt takes coumadin and has a coumadin stop and lovenox bridge per Dr Osborne Casco - last coumadin dose SAT 3-23 and start lovenox BID starting Sunday 3-24- copy of blood thinner hold and lovenox bridge to be scanned in chart  Pt denies issues with constipation  No A fib or A flutter  Pt has no e mail  Son in [place] with patient today -

## 2017-06-22 DIAGNOSIS — T4145XA Adverse effect of unspecified anesthetic, initial encounter: Secondary | ICD-10-CM | POA: Insufficient documentation

## 2017-06-22 DIAGNOSIS — T8859XA Other complications of anesthesia, initial encounter: Secondary | ICD-10-CM | POA: Insufficient documentation

## 2017-06-24 ENCOUNTER — Other Ambulatory Visit: Payer: Self-pay

## 2017-06-24 ENCOUNTER — Encounter: Payer: Self-pay | Admitting: Internal Medicine

## 2017-06-24 ENCOUNTER — Ambulatory Visit (AMBULATORY_SURGERY_CENTER): Payer: Medicare Other | Admitting: Internal Medicine

## 2017-06-24 VITALS — BP 130/65 | HR 60 | Temp 97.1°F | Resp 12 | Ht 62.0 in | Wt 155.0 lb

## 2017-06-24 DIAGNOSIS — Q396 Congenital diverticulum of esophagus: Secondary | ICD-10-CM | POA: Diagnosis not present

## 2017-06-24 DIAGNOSIS — R933 Abnormal findings on diagnostic imaging of other parts of digestive tract: Secondary | ICD-10-CM | POA: Diagnosis not present

## 2017-06-24 DIAGNOSIS — K219 Gastro-esophageal reflux disease without esophagitis: Secondary | ICD-10-CM

## 2017-06-24 DIAGNOSIS — K228 Other specified diseases of esophagus: Secondary | ICD-10-CM | POA: Diagnosis not present

## 2017-06-24 MED ORDER — SODIUM CHLORIDE 0.9 % IV SOLN: 500.0000 mL | Freq: Once | INTRAVENOUS | Status: DC

## 2017-06-24 NOTE — Op Note (Addendum)
[facility] Patient Name: [name] Procedure Date: 06/24/2017 10:04 AM MRN: [id number] Endoscopist: Gatha Mayer , MD Age: 84 Referring MD:  Date of Birth: 1925/05/31 Gender: Female Account #: [id number] Procedure:                Upper GI endoscopy Indications:              Suspected esophageal reflux, Abnormal UGI series Medicines:                Propofol per Anesthesia, Monitored Anesthesia Care Procedure:                Pre-Anesthesia Assessment:                           - Prior to the procedure, a History and Physical                            was performed, and patient medications and                            allergies were reviewed. The patient's tolerance of                            previous anesthesia was also reviewed. The risks                            and benefits of the procedure and the sedation                            options and risks were discussed with the patient.                            All questions were answered, and informed consent                            was obtained. Prior Anticoagulants: The patient                            last took Coumadin (warfarin) 5 days prior to the                            procedure. ASA Grade Assessment: II - A patient                            with mild systemic disease. After reviewing the                            risks and benefits, the patient was deemed in                            satisfactory condition to undergo the procedure.                           After obtaining informed consent, the endoscope was  passed under direct vision. Throughout the                            procedure, the patient's blood pressure, pulse, and                            oxygen saturations were monitored continuously. The                            Endoscope was introduced through the mouth, and                            advanced to the second part of duodenum. The upper                       GI endoscopy was accomplished without difficulty.                            The patient tolerated the procedure well. Scope In: Scope Out: Findings:                 A diverticulum with a small opening was found in                            the upper third of the esophagus.                           A small hiatal hernia was present.                           The exam was otherwise without abnormality.                           The cardia and gastric fundus were normal on                            retroflexion. Complications:            No immediate complications. Estimated Blood Loss:     Estimated blood loss: none. Impression:               - Diverticulum in the upper third of the esophagus.                           - Small hiatal hernia.                           - The examination was otherwise normal. I did not                            see any polypoid lesion in proximal esophagus as                            suggested on upper GI                           -  No specimens collected. Recommendation:           - Patient has a contact number available for                            emergencies. The signs and symptoms of potential                            delayed complications were discussed with the                            patient. Return to normal activities tomorrow.                            Written discharge instructions were provided to the                            patient.                           - Resume previous diet.                           - Continue present medications.                           - Resume Coumadin (warfarin) at prior dose today.                            Refer to referring physician for further adjustment                            of therapy.                           will observe for now - raise head of bed to reduce                            regurgitation - she had a neck CT angio 10/18 and                             no mass so ? artifact                           - Will review things - ? manometry needed                           ? repair of diverticulum but age might make that                            undesirable                           She and granddaughter describe post-prandial nausea  and she has some intermittent low volume                            regurgitation and possible vomiting                           Also heartburn despite Tx                           "Nothing I can't live with"                           also tells me she is having increased rectal                            bleeding "I have hemorrhoids"                           I will contact her with follow-up plans Gatha Mayer, MD 06/24/2017 10:25:49 AM This report has been signed electronically.

## 2017-06-24 NOTE — Progress Notes (Signed)
Pt's states no medical or surgical changes since previsit or office visit. 

## 2017-06-24 NOTE — Patient Instructions (Addendum)
I did not see any signs of cancer or anything bad.  There is a diverticulum or pouch in the upper esophagus that I think is causing some of the regurgitation.  Please be sure to have the head of your bed elevated or sleep on a wedge.  Let's see if that helps.  I want to think about things a bit more and will contact you and schedule follow-up.   I appreciate the opportunity to care for you. Gatha Mayer, MD, [facility]  Diverticulosis handout given to patient. Hiatal hernia handout given to patient.  Resume previous diet. Continue present medications.  Resume Coumadin (Warfarin) at prior dose today.  Interpreter used today at the [facility] for this pt.  Interpreter's name is-YOU HAD AN ENDOSCOPIC PROCEDURE TODAY AT [facility]:   Refer to the procedure report that was given to you for any specific questions about what was found during the examination.  If the procedure report does not answer your questions, please call your gastroenterologist to clarify.  If you requested that your care partner not be given the details of your procedure findings, then the procedure report has been included in a sealed envelope for you to review at your convenience later.  YOU SHOULD EXPECT: Some feelings of bloating in the abdomen. Passage of more gas than usual.  Walking can help get rid of the air that was put into your GI tract during the procedure and reduce the bloating. If you had a lower endoscopy (such as a colonoscopy or flexible sigmoidoscopy) you may notice spotting of blood in your stool or on the toilet paper. If you underwent a bowel prep for your procedure, you may not have a normal bowel movement for a few days.  Please Note:  You might notice some irritation and congestion in your nose or some drainage.  This is from the oxygen used during your procedure.  There is no need for concern and it should clear up in a day or so.  SYMPTOMS TO REPORT  IMMEDIATELY:   Following upper endoscopy (EGD)  Vomiting of blood or coffee ground material  New chest pain or pain under the shoulder blades  Painful or persistently difficult swallowing  New shortness of breath  Fever of 100F or higher  Black, tarry-looking stools  For urgent or emergent issues, a gastroenterologist can be reached at any hour by calling [phone].   DIET:  We do recommend a small meal at first, but then you may proceed to your regular diet.  Drink plenty of fluids but you should avoid alcoholic beverages for 24 hours.  ACTIVITY:  You should plan to take it easy for the rest of today and you should NOT DRIVE or use heavy machinery until tomorrow (because of the sedation medicines used during the test).    FOLLOW UP: Our staff will call the number listed on your records the next business day following your procedure to check on you and address any questions or concerns that you may have regarding the information given to you following your procedure. If we do not reach you, we will leave a message.  However, if you are feeling well and you are not experiencing any problems, there is no need to return our call.  We will assume that you have returned to your regular daily activities without incident.  If any biopsies were taken you will be contacted by phone or by letter within the next 1-3 weeks.  Please call us at [phone] if you have not heard about the biopsies in 3 weeks.    SIGNATURES/CONFIDENTIALITY: You and/or your care partner have signed paperwork which will be entered into your electronic medical record.  These signatures attest to the fact that that the information above on your After Visit Summary has been reviewed and is understood.  Full responsibility of the confidentiality of this discharge information lies with you and/or your care-partner.

## 2017-06-24 NOTE — Progress Notes (Signed)
A and O x3. Report to RN. Tolerated MAC anesthesia well.Teeth unchanged after procedure.

## 2017-06-27 ENCOUNTER — Telehealth: Payer: Self-pay | Admitting: *Deleted

## 2017-06-27 NOTE — Telephone Encounter (Signed)
  Follow up Call-  Call back number 06/24/2017  Post procedure Call Back phone  # [phone]  Permission to leave phone message Yes  Some recent data might be hidden     Patient questions:  Do you have a fever, pain , or abdominal swelling? No. Pain Score  0 *  Have you tolerated food without any problems? Yes.    Have you been able to return to your normal activities? Yes.    Do you have any questions about your discharge instructions: Diet   Yes.   Medications  No. Follow up visit  No.  Do you have questions or concerns about your Care? Yes.    Actions: * If pain score is 4 or above: No action needed, pain <4.

## 2017-06-28 IMAGING — CT CT CERVICAL SPINE W/O CM
3 of 4 series · 12 of 33 positions shown, 14 images · non-contrast
Comparison: None.

CLINICAL DATA: Left neck pain for 2 days.  No reported injury.

EXAM:
CT CERVICAL SPINE WITHOUT CONTRAST
TECHNIQUE: Multidetector CT imaging of the cervical spine was performed without
intravenous contrast. Multiplanar CT image reconstructions were also
generated.

[Series 3: c-spine st · axial · 0.28mm/px · z∈[+1452,+1582]mm · 4 of 99 slices shown, 5 images]
[im 17/99  soft-tissue]
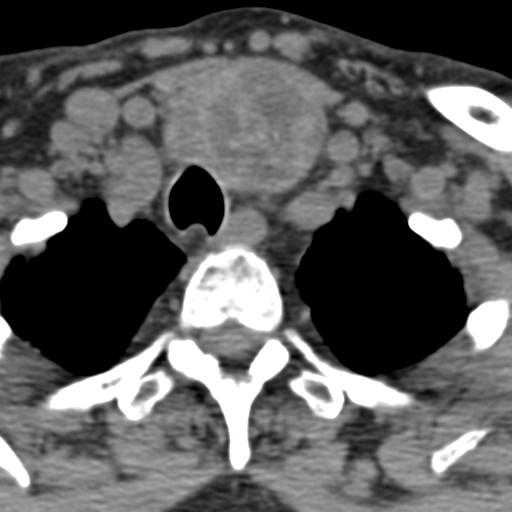
[im 17/99  bone]
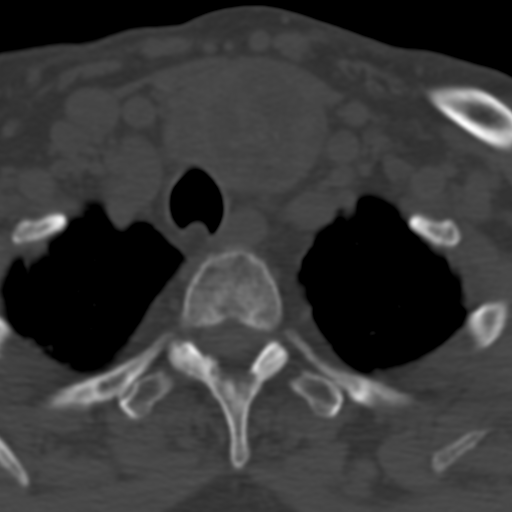
[im 33/99  bone]
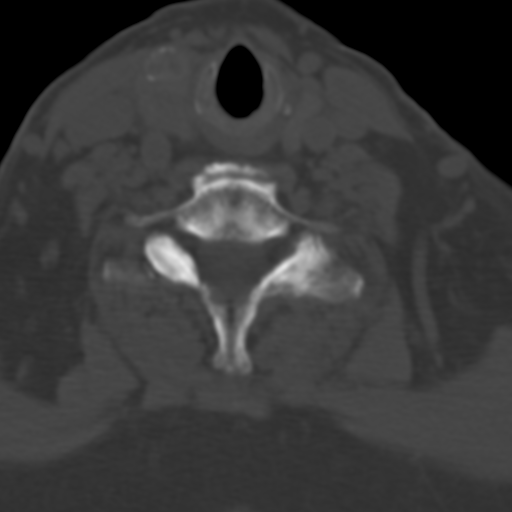
[im 66/99  bone]
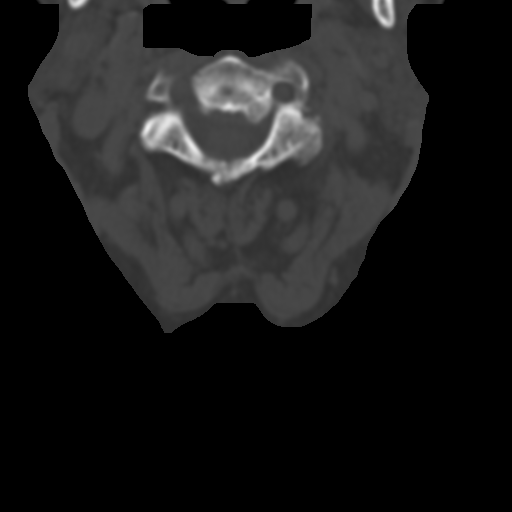
[im 82/99  bone]
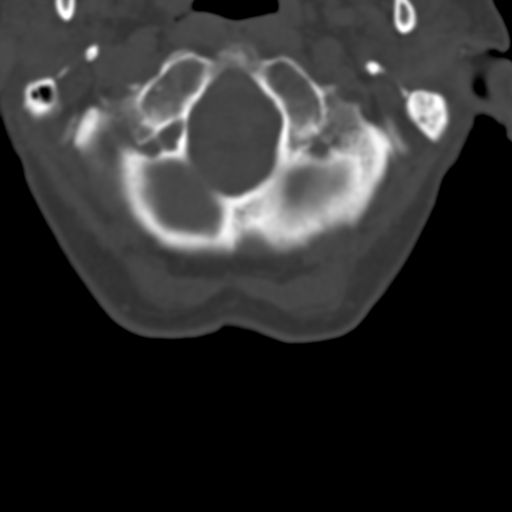

[Series 7: coronal recons · coronal · 0.29mm/px · 3 of 61 slices shown]
[im 14/61  bone]
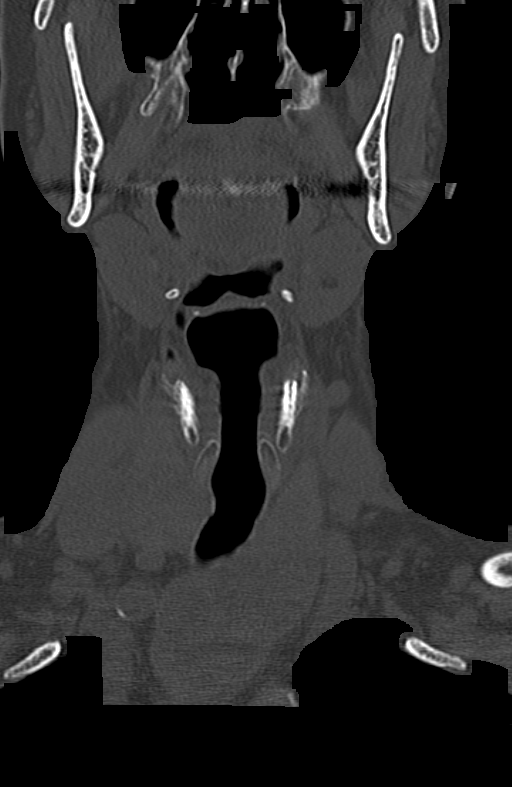
[im 25/61  bone]
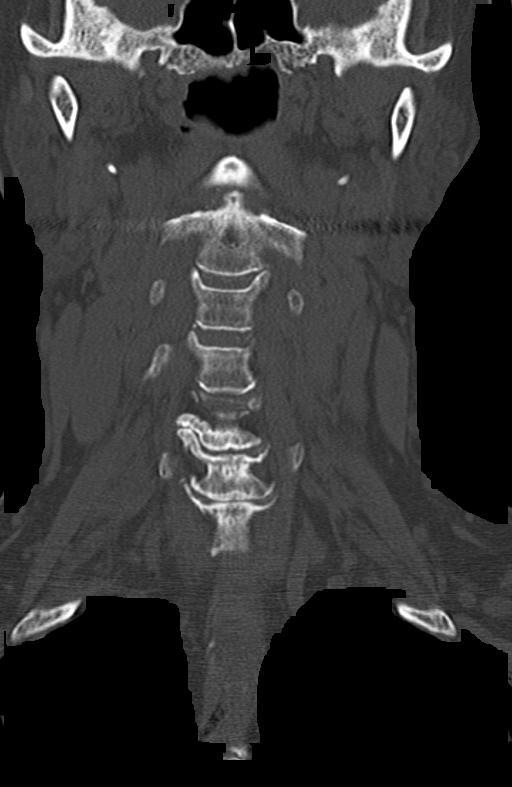
[im 36/61  bone]
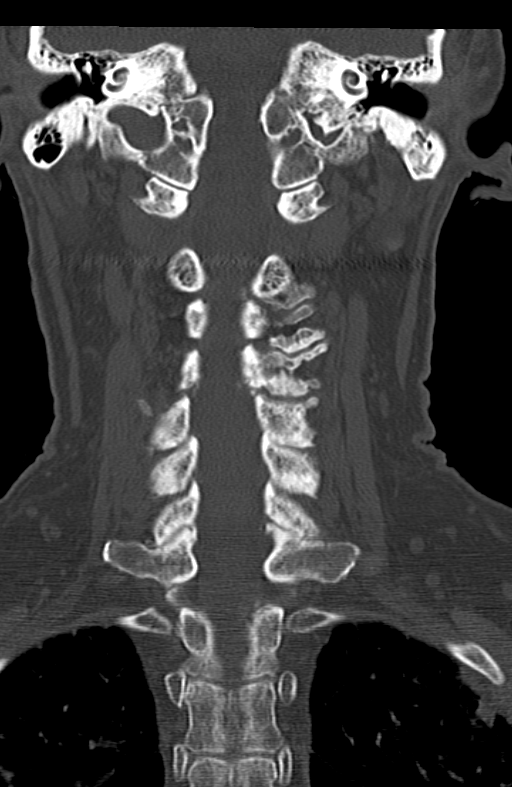

[Series 8: sagittal recons · sagittal · 0.29mm/px · 5 of 61 slices shown, 6 images]
[im 21/61  bone]
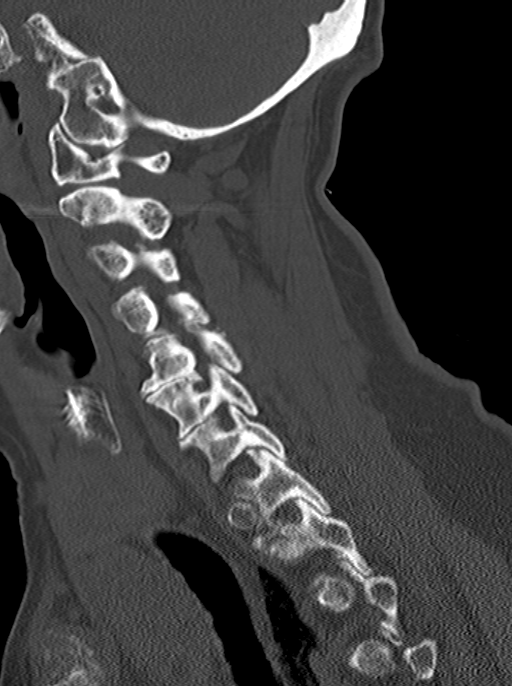
[im 26/61  bone]
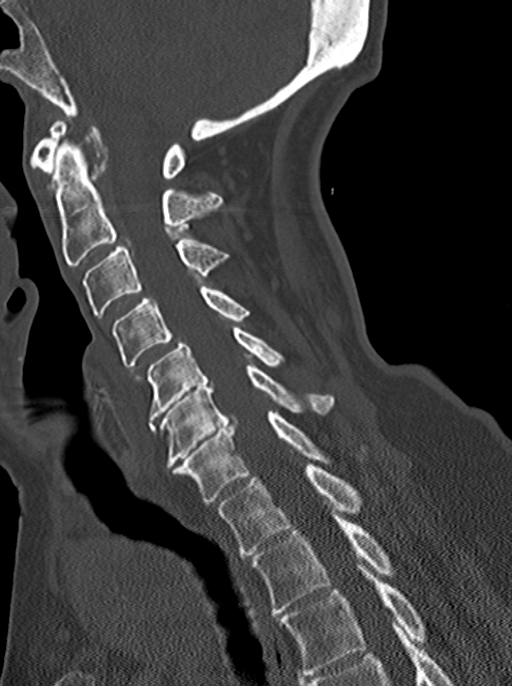
[im 31/61  soft-tissue]
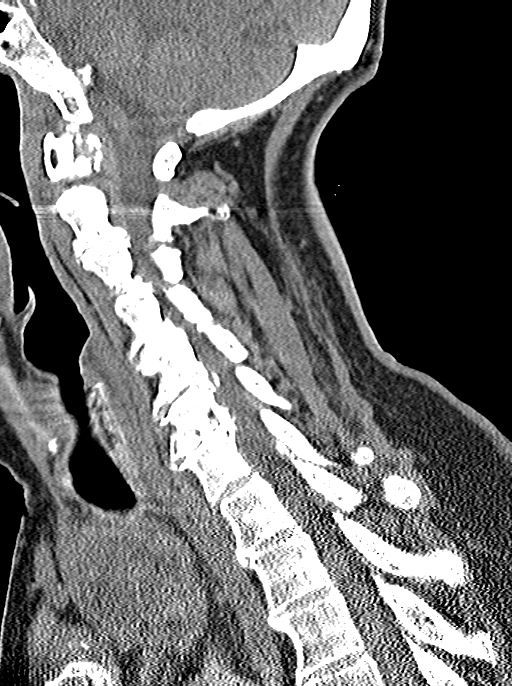
[im 31/61  bone]
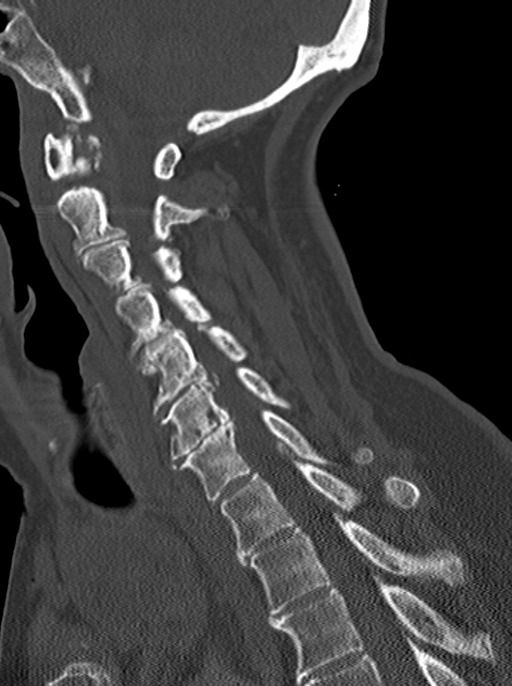
[im 36/61  bone]
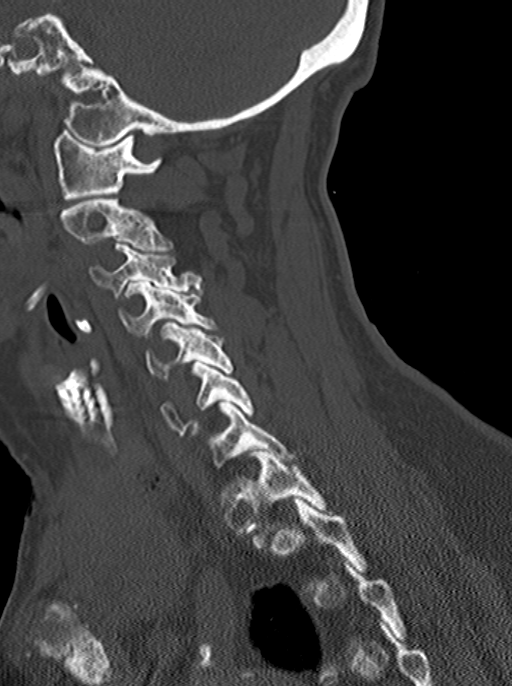
[im 41/61  bone]
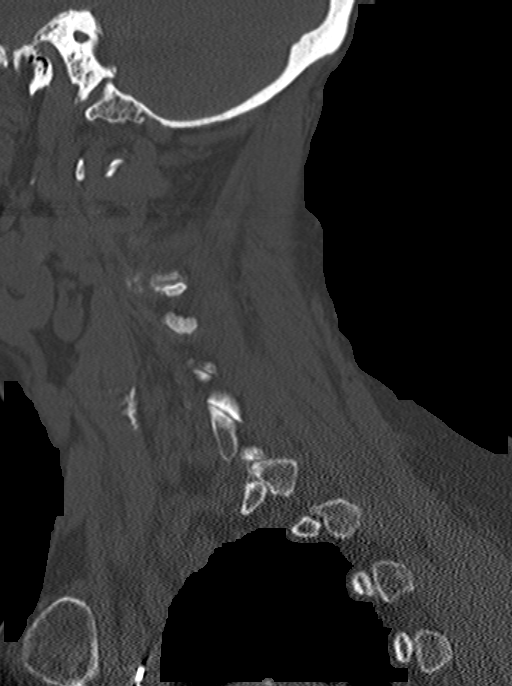

[12 of 33 positions shown; findings below may reference images not displayed]

FINDINGS: No fracture is detected in the cervical spine. No prevertebral soft
tissue swelling. There is straightening of the cervical spine,
usually due to positioning and/or muscle spasm. Dens is well
positioned between the lateral masses of C1, noting prominent
calcification of the annular ligament and degenerative disc disease
at the atlantodental joint. The lateral masses appear well-aligned.
There is severe multilevel degenerative disc disease throughout the
cervical spine, most prominent C5-6 and C6-7. There is mild anterior
canal stenosis at C5-6 and C6-7 due to disc osteophyte complexes.
There is moderate facet arthropathy bilaterally, asymmetrically
prominent on the left. There is moderate foraminal stenosis on the
left at C2-3. There is severe foraminal stenosis on the left at
C3-4. There is mild bilateral foraminal stenosis at C4-5 and
moderate bilateral foraminal stenosis at C5-6.

Visualized mastoid air cells appear clear. No evidence of
intra-axial hemorrhage in the visualized brain. No gross cervical
canal hematoma. Visualized lung apices are clear. There is diffuse
enlargement of the thyroid lobes, left greater than right, with
heterogeneous thyroid parenchymal density containing scattered
calcifications, most suggestive of a multinodular goiter, with the
suggestion of a dominant 3.6 cm left thyroid lobe nodule. No
appreciable cervical adenopathy or other cervical spine soft tissue
abnormality.
IMPRESSION: 1. No cervical spine fracture.
2. Straightening of the cervical spine, usually positional and/or
due to muscle spasm.
3. Severe multilevel degenerative disc disease in the cervical
spine, most prominent at C5-6 and C6-7 with associated mild
degenerative anterior canal stenosis at these levels.
4. Moderate facet arthropathy bilaterally, asymmetrically prominent
on the left. Multilevel degenerative cervical foraminal stenosis as
described.
5. Multinodular goiter, with a dominant 3.6 cm left thyroid lobe
nodule. Correlate with thyroid ultrasound as clinically warranted.

## 2017-06-29 ENCOUNTER — Encounter: Payer: Self-pay | Admitting: Internal Medicine

## 2017-06-29 DIAGNOSIS — Z6828 Body mass index (BMI) 28.0-28.9, adult: Secondary | ICD-10-CM | POA: Diagnosis not present

## 2017-06-29 DIAGNOSIS — I482 Chronic atrial fibrillation: Secondary | ICD-10-CM | POA: Diagnosis not present

## 2017-06-29 DIAGNOSIS — Z7901 Long term (current) use of anticoagulants: Secondary | ICD-10-CM | POA: Diagnosis not present

## 2017-06-29 DIAGNOSIS — R001 Bradycardia, unspecified: Secondary | ICD-10-CM | POA: Diagnosis not present

## 2017-06-29 DIAGNOSIS — K219 Gastro-esophageal reflux disease without esophagitis: Secondary | ICD-10-CM | POA: Diagnosis not present

## 2017-06-29 DIAGNOSIS — I495 Sick sinus syndrome: Secondary | ICD-10-CM | POA: Diagnosis not present

## 2017-06-29 DIAGNOSIS — R110 Nausea: Secondary | ICD-10-CM | POA: Diagnosis not present

## 2017-07-13 DIAGNOSIS — I482 Chronic atrial fibrillation: Secondary | ICD-10-CM | POA: Diagnosis not present

## 2017-07-13 DIAGNOSIS — K219 Gastro-esophageal reflux disease without esophagitis: Secondary | ICD-10-CM | POA: Diagnosis not present

## 2017-07-13 DIAGNOSIS — Z7901 Long term (current) use of anticoagulants: Secondary | ICD-10-CM | POA: Diagnosis not present

## 2017-07-27 ENCOUNTER — Ambulatory Visit (INDEPENDENT_AMBULATORY_CARE_PROVIDER_SITE_OTHER): Payer: Medicare Other | Admitting: *Deleted

## 2017-07-27 DIAGNOSIS — I482 Chronic atrial fibrillation: Secondary | ICD-10-CM | POA: Diagnosis not present

## 2017-07-27 DIAGNOSIS — K219 Gastro-esophageal reflux disease without esophagitis: Secondary | ICD-10-CM | POA: Diagnosis not present

## 2017-07-27 DIAGNOSIS — D692 Other nonthrombocytopenic purpura: Secondary | ICD-10-CM | POA: Diagnosis not present

## 2017-07-27 DIAGNOSIS — I4891 Unspecified atrial fibrillation: Secondary | ICD-10-CM

## 2017-07-27 DIAGNOSIS — E7800 Pure hypercholesterolemia, unspecified: Secondary | ICD-10-CM | POA: Diagnosis not present

## 2017-07-27 DIAGNOSIS — I10 Essential (primary) hypertension: Secondary | ICD-10-CM | POA: Diagnosis not present

## 2017-07-27 DIAGNOSIS — I5032 Chronic diastolic (congestive) heart failure: Secondary | ICD-10-CM | POA: Diagnosis not present

## 2017-07-27 DIAGNOSIS — E441 Mild protein-calorie malnutrition: Secondary | ICD-10-CM | POA: Diagnosis not present

## 2017-07-27 DIAGNOSIS — Z6827 Body mass index (BMI) 27.0-27.9, adult: Secondary | ICD-10-CM | POA: Diagnosis not present

## 2017-07-27 DIAGNOSIS — M50121 Cervical disc disorder at C4-C5 level with radiculopathy: Secondary | ICD-10-CM | POA: Diagnosis not present

## 2017-07-27 DIAGNOSIS — Z950 Presence of cardiac pacemaker: Secondary | ICD-10-CM | POA: Diagnosis not present

## 2017-07-27 LAB — CUP PACEART INCLINIC DEVICE CHECK
Brady Statistic RV Percent Paced: 64 %
Lead Channel Pacing Threshold Amplitude: 1 V
Lead Channel Pacing Threshold Pulse Width: 0.4 ms
Lead Channel Sensing Intrinsic Amplitude: 16 mV
Lead Channel Setting Pacing Pulse Width: 0.4 ms
Lead Channel Setting Sensing Sensitivity: 2 mV
MDC IDC LEAD IMPLANT DT: 20070501
MDC IDC LEAD LOCATION: 753860
MDC IDC MSMT BATTERY IMPEDANCE: 2200 Ohm
MDC IDC MSMT BATTERY VOLTAGE: 2.76 V
MDC IDC MSMT LEADCHNL RV IMPEDANCE VALUE: 380 Ohm
MDC IDC PG IMPLANT DT: 20070501
MDC IDC SESS DTM: 20190501163831
Pulse Gen Model: 5156
Pulse Gen Serial Number: 1664242

## 2017-07-27 NOTE — Progress Notes (Signed)
Pacemaker check in clinic. Normal device function. Threshold, sensing, impedance consistent with previous measurements. Device programmed to maximize longevity . Device programmed at appropriate safety margins. Histogram distribution appropriate for patient activity level. Device programmed to optimize intrinsic conduction. Estimated longevity 5-6 years. ROV w/ GT 12/2016

## 2017-08-15 DIAGNOSIS — Z7901 Long term (current) use of anticoagulants: Secondary | ICD-10-CM | POA: Diagnosis not present

## 2017-08-15 DIAGNOSIS — I482 Chronic atrial fibrillation: Secondary | ICD-10-CM | POA: Diagnosis not present

## 2017-08-23 ENCOUNTER — Telehealth: Payer: Self-pay

## 2017-08-23 NOTE — Telephone Encounter (Signed)
Left message to call me back so I can get an update on how she is doing?

## 2017-08-23 NOTE — Telephone Encounter (Signed)
-----   Message from Gatha Mayer, MD sent at 08/23/2017 12:55 PM EDT ----- Regarding: Need f/u Please contact patient to see how reflux is doing  I had left myself a note to f/u w/ her and this is late but tell her checking on her to see if we need to do anything different or if things have gotten better   ----- Message ----- From: Gatha Mayer, MD Sent: 06/24/2017  10:38 AM To: Gatha Mayer, MD Subject: f/l                                            What next

## 2017-08-24 NOTE — Telephone Encounter (Signed)
Patient informed and she will call us back as needed.

## 2017-08-24 NOTE — Telephone Encounter (Signed)
  Mrs. Ivins called back and she is taking either Tums or [employer] brand of zantac at bedtime.  She is watching what she eats. Over all she is alittle better.  She said thanks for checking on her.

## 2017-08-24 NOTE — Telephone Encounter (Signed)
She can take up to 300 mg of Zantac at bedtime if she thinks that is helping.  May schedule f/u prn

## 2017-09-13 DIAGNOSIS — I482 Chronic atrial fibrillation: Secondary | ICD-10-CM | POA: Diagnosis not present

## 2017-09-13 DIAGNOSIS — Z7901 Long term (current) use of anticoagulants: Secondary | ICD-10-CM | POA: Diagnosis not present

## 2017-09-13 DIAGNOSIS — Z6828 Body mass index (BMI) 28.0-28.9, adult: Secondary | ICD-10-CM | POA: Diagnosis not present

## 2017-09-28 ENCOUNTER — Encounter: Payer: Self-pay | Admitting: Internal Medicine

## 2017-09-28 DIAGNOSIS — I495 Sick sinus syndrome: Secondary | ICD-10-CM | POA: Diagnosis not present

## 2017-09-30 DIAGNOSIS — E7800 Pure hypercholesterolemia, unspecified: Secondary | ICD-10-CM | POA: Diagnosis not present

## 2017-09-30 DIAGNOSIS — I10 Essential (primary) hypertension: Secondary | ICD-10-CM | POA: Diagnosis not present

## 2017-09-30 DIAGNOSIS — E0520 Thyrotoxicosis with toxic multinodular goiter without thyrotoxic crisis or storm: Secondary | ICD-10-CM | POA: Diagnosis not present

## 2017-10-07 DIAGNOSIS — Z7901 Long term (current) use of anticoagulants: Secondary | ICD-10-CM | POA: Diagnosis not present

## 2017-10-07 DIAGNOSIS — M1990 Unspecified osteoarthritis, unspecified site: Secondary | ICD-10-CM | POA: Diagnosis not present

## 2017-10-07 DIAGNOSIS — Z950 Presence of cardiac pacemaker: Secondary | ICD-10-CM | POA: Diagnosis not present

## 2017-10-07 DIAGNOSIS — I5032 Chronic diastolic (congestive) heart failure: Secondary | ICD-10-CM | POA: Diagnosis not present

## 2017-10-07 DIAGNOSIS — Z1212 Encounter for screening for malignant neoplasm of rectum: Secondary | ICD-10-CM | POA: Diagnosis not present

## 2017-10-07 DIAGNOSIS — I482 Chronic atrial fibrillation: Secondary | ICD-10-CM | POA: Diagnosis not present

## 2017-10-07 DIAGNOSIS — E7800 Pure hypercholesterolemia, unspecified: Secondary | ICD-10-CM | POA: Diagnosis not present

## 2017-10-07 DIAGNOSIS — Z0000 Encounter for general adult medical examination without abnormal findings: Secondary | ICD-10-CM | POA: Diagnosis not present

## 2017-10-07 DIAGNOSIS — Z1389 Encounter for screening for other disorder: Secondary | ICD-10-CM | POA: Diagnosis not present

## 2017-10-07 DIAGNOSIS — Z6827 Body mass index (BMI) 27.0-27.9, adult: Secondary | ICD-10-CM | POA: Diagnosis not present

## 2017-10-07 DIAGNOSIS — D692 Other nonthrombocytopenic purpura: Secondary | ICD-10-CM | POA: Diagnosis not present

## 2017-10-07 DIAGNOSIS — I10 Essential (primary) hypertension: Secondary | ICD-10-CM | POA: Diagnosis not present

## 2017-10-07 DIAGNOSIS — I6789 Other cerebrovascular disease: Secondary | ICD-10-CM | POA: Diagnosis not present

## 2017-10-18 DIAGNOSIS — I482 Chronic atrial fibrillation: Secondary | ICD-10-CM | POA: Diagnosis not present

## 2017-10-18 DIAGNOSIS — Z7901 Long term (current) use of anticoagulants: Secondary | ICD-10-CM | POA: Diagnosis not present

## 2017-11-15 DIAGNOSIS — I5032 Chronic diastolic (congestive) heart failure: Secondary | ICD-10-CM | POA: Diagnosis not present

## 2017-11-15 DIAGNOSIS — Z7901 Long term (current) use of anticoagulants: Secondary | ICD-10-CM | POA: Diagnosis not present

## 2017-11-15 DIAGNOSIS — I482 Chronic atrial fibrillation: Secondary | ICD-10-CM | POA: Diagnosis not present

## 2017-12-08 DIAGNOSIS — Z23 Encounter for immunization: Secondary | ICD-10-CM | POA: Diagnosis not present

## 2017-12-20 DIAGNOSIS — M542 Cervicalgia: Secondary | ICD-10-CM | POA: Diagnosis not present

## 2017-12-20 DIAGNOSIS — M1990 Unspecified osteoarthritis, unspecified site: Secondary | ICD-10-CM | POA: Diagnosis not present

## 2017-12-20 DIAGNOSIS — I482 Chronic atrial fibrillation: Secondary | ICD-10-CM | POA: Diagnosis not present

## 2017-12-20 DIAGNOSIS — Z7901 Long term (current) use of anticoagulants: Secondary | ICD-10-CM | POA: Diagnosis not present

## 2017-12-26 ENCOUNTER — Encounter: Payer: Self-pay | Admitting: Internal Medicine

## 2017-12-26 ENCOUNTER — Ambulatory Visit (INDEPENDENT_AMBULATORY_CARE_PROVIDER_SITE_OTHER): Payer: Medicare Other | Admitting: Internal Medicine

## 2017-12-26 VITALS — BP 140/66 | HR 60 | Ht 62.0 in | Wt 157.6 lb

## 2017-12-26 DIAGNOSIS — Z950 Presence of cardiac pacemaker: Secondary | ICD-10-CM | POA: Diagnosis not present

## 2017-12-26 DIAGNOSIS — I4891 Unspecified atrial fibrillation: Secondary | ICD-10-CM | POA: Diagnosis not present

## 2017-12-26 LAB — CUP PACEART INCLINIC DEVICE CHECK
Battery Impedance: 2300 Ohm
Implantable Lead Location: 753860
Lead Channel Impedance Value: 360 Ohm
Lead Channel Pacing Threshold Pulse Width: 0.4 ms
Lead Channel Sensing Intrinsic Amplitude: 16.6 mV
Lead Channel Setting Pacing Pulse Width: 0.4 ms
Lead Channel Setting Sensing Sensitivity: 2 mV
MDC IDC LEAD IMPLANT DT: 20070501
MDC IDC MSMT BATTERY VOLTAGE: 2.76 V
MDC IDC MSMT LEADCHNL RV PACING THRESHOLD AMPLITUDE: 1 V
MDC IDC PG IMPLANT DT: 20070501
MDC IDC SESS DTM: 20190930143334
Pulse Gen Model: 5156
Pulse Gen Serial Number: 1664242

## 2017-12-26 NOTE — Patient Instructions (Signed)
Your physician recommends that you continue on your current medications as directed. Please refer to the Current Medication list given to you today.  Your physician wants you to follow-up in: 6 months with Dr. Lovena Le.  You will receive a reminder letter in the mail two months in advance. If you don't receive a letter, please call our office to schedule the follow-up appointment.

## 2017-12-26 NOTE — Progress Notes (Signed)
HPI [name] returns today for ongoing evaluation and management of chronic diastolic heart failure, hypertension, symptomatic bradycardia, status post pacemaker insertion. The patient has had problems with peripheral edema. We initially tried diuretic therapy with furosemide that she was intolerant of this. The patient has been on combination hydrochlorothiazide and triamterene recently. Her peripheral edema has improved. Today she denied chest pain. She still has some sob.  Allergies  Allergen Reactions  . Morphine And Related Nausea And Vomiting     Current Outpatient Medications  Medication Sig Dispense Refill  . acetaminophen (TYLENOL) 325 MG tablet Take 650 mg by mouth every 6 (six) hours as needed (pain).    Marland Kitchen amLODipine (NORVASC) 10 MG tablet Take 10 mg by mouth every morning.     . clonazePAM (KLONOPIN) 0.5 MG tablet Take 0.5 mg by mouth daily as needed for anxiety.     . cyclobenzaprine (FLEXERIL) 5 MG tablet Take 1 tablet (5 mg total) by mouth 3 (three) times daily as needed for muscle spasms. 15 tablet 0  . irbesartan (AVAPRO) 300 MG tablet Take 300 mg by mouth every morning.    . methimazole (TAPAZOLE) 5 MG tablet TAKE ONE TABLET BY MOUTH DAILY WITH FOOD  0  . ondansetron (ZOFRAN) 4 MG tablet Take 1 tablet (4 mg total) by mouth every 4 (four) hours as needed for nausea or vomiting. 20 tablet 1  . polyethylene glycol (MIRALAX / GLYCOLAX) packet Take 17 g by mouth daily as needed. 14 each 0  . potassium chloride SA (K-DUR,KLOR-CON) 20 MEQ tablet Take 20 mEq by mouth every morning.     . pravastatin (PRAVACHOL) 40 MG tablet Take 40 mg by mouth every morning.     Marland Kitchen PREVIDENT 5000 PLUS 1.1 % CREA dental cream Take 1 application by mouth at bedtime. As directed    . ranitidine (ZANTAC) 150 MG capsule Take 150 mg by mouth daily.    . traMADol (ULTRAM) 50 MG tablet Take 2 tablets (100 mg total) by mouth every 6 (six) hours as needed for moderate pain. 10 tablet 0  .  triamterene-hydrochlorothiazide (DYAZIDE) 37.5-25 MG capsule Take one capsule by mouth daily 90 capsule 2  . warfarin (COUMADIN) 2 MG tablet Take Coumadin for three weeks for postoperative protocol and then the patient may resume their previous Coumadin home regimen.  The dose may need to be adjusted based upon the INR.  Please follow the INR and titrate Coumadin dose for a therapeutic range between 2.0 and 3.0 INR.  After completing the three weeks of Coumadin, the patient may resume their previous Coumadin home regimen.   CURRENTLY HOLDING THE COUMADIN FOR THE CORTISONE INJECTION (Patient taking differently: Take 1 tablet daily except Monday and Thursday (takes 2 tabs these days)) 30 tablet 0   Current Facility-Administered Medications  Medication Dose Route Frequency Provider Last Rate Last Dose  . 0.9 %  sodium chloride infusion  500 mL Intravenous Once Gatha Mayer, MD      . 0.9 %  sodium chloride infusion  500 mL Intravenous Once Gatha Mayer, MD         Past Medical History:  Diagnosis Date  . Anxiety   . Atrial fibrillation ([place])   . Bradycardia   . Cancer ([place])    skin  . Cataract    removed bilat with lens both eyes   . Complication of anesthesia   . Difficulty sleeping   . Dysrhythmia    A  FIB  . GERD (gastroesophageal reflux disease)   . H/O hiatal hernia   . History of kidney stones   . History of skin cancer   . History of transfusion   . Hyperlipidemia   . Hypertension   . Osteoarthritis   . Pacemaker   . Pneumonia   . PONV (postoperative nausea and vomiting)   . Shortness of breath    WITH EXERTION    ROS:   All systems reviewed and negative except as noted in the HPI.   Past Surgical History:  Procedure Laterality Date  . APPENDECTOMY  1960  . ARTHROPLASTY  1994   Left total knee  . ARTHROPLASTY  2000   Total right knee  . BACK SURGERY    . CATARACT EXTRACTION, BILATERAL     with lens implants  . CHOLECYSTECTOMY    . COLONOSCOPY    .  DG SELECTED HSG GDC ONLY  2002   Dilation  . Microdiskectomy  05/2004   Left L4-L5  . PACEMAKER INSERTION    . POLYPECTOMY    . TOTAL ABDOMINAL HYSTERECTOMY  1969  . TOTAL HIP ARTHROPLASTY Right 12/24/2013   Procedure: RIGHT TOTAL HIP ARTHROPLASTY ANTERIOR APPROACH;  Surgeon: Gearlean Alf, MD;  Location: [facility];  Service: Orthopedics;  Laterality: Right;  . UPPER GASTROINTESTINAL ENDOSCOPY       Family History  Problem Relation Age of Onset  . Heart disease Mother   . Leukemia Father   . Colon cancer Neg Hx   . Esophageal cancer Neg Hx   . Stomach cancer Neg Hx   . Pancreatic cancer Neg Hx   . Liver disease Neg Hx   . Colon polyps Neg Hx      Social History   Socioeconomic History  . Marital status: Married    Spouse name: Not on file  . Number of children: Not on file  . Years of education: Not on file  . Highest education level: Not on file  Occupational History  . Not on file  Social Needs  . Financial resource strain: Not on file  . Food insecurity:    Worry: Not on file    Inability: Not on file  . Transportation needs:    Medical: Not on file    Non-medical: Not on file  Tobacco Use  . Smoking status: Former Smoker    Last attempt to quit: 12/18/1973    Years since quitting: 44.0  . Smokeless tobacco: Never Used  . Tobacco comment: Smoker for 25 years and quit at around age 12  Substance and Sexual Activity  . Alcohol use: No  . Drug use: No  . Sexual activity: Not on file  Lifestyle  . Physical activity:    Days per week: Not on file    Minutes per session: Not on file  . Stress: Not on file  Relationships  . Social connections:    Talks on phone: Not on file    Gets together: Not on file    Attends religious service: Not on file    Active member of club or organization: Not on file    Attends meetings of clubs or organizations: Not on file    Relationship status: Not on file  . Intimate partner violence:    Fear of current or ex partner:  Not on file    Emotionally abused: Not on file    Physically abused: Not on file    Forced sexual activity: Not on file  Other Topics Concern  . Not on file  Social History Narrative   Married, husband's name is [name], they have been married since 41   2 sons and 4 grandchildren           BP 140/66   Pulse 60   Ht 5\' 2"  (1.575 m)   Wt 157 lb 9.6 oz (71.5 kg)   BMI 28.83 kg/m   Physical Exam:  Well appearing NAD HEENT: Unremarkable Neck:  No JVD, no thyromegally Lymphatics:  No adenopathy Back:  No CVA tenderness Lungs:  Clear HEART:  Regular rate rhythm, no murmurs, no rubs, no clicks Abd:  soft, positive bowel sounds, no organomegally, no rebound, no guarding Ext:  2 plus pulses, no edema, no cyanosis, no clubbing Skin:  No rashes no nodules Neuro:  CN II through XII intact, motor grossly intact  EKG - atrial fib with venricular pacing  DEVICE  Normal device function.  See PaceArt for details.   Assess/Plan: 1. Atrial fib - she is chronically in atrial fib. Her VR is controlled. 2. Chronic diastolic CHF - her symptoms are class 2. She is bothered by dyspnea. 3. PPM - her [employer] single chamber PM is working normally. 4. HTN - her bp is up a little but she thinks it is better when not in MD's office. She is encouraged to reduce her sodium intake.  Mikle Bosworth.D.

## 2018-01-04 DIAGNOSIS — K219 Gastro-esophageal reflux disease without esophagitis: Secondary | ICD-10-CM | POA: Diagnosis not present

## 2018-01-04 DIAGNOSIS — I4821 Permanent atrial fibrillation: Secondary | ICD-10-CM | POA: Diagnosis not present

## 2018-01-04 DIAGNOSIS — Z7901 Long term (current) use of anticoagulants: Secondary | ICD-10-CM | POA: Diagnosis not present

## 2018-01-04 DIAGNOSIS — Z6827 Body mass index (BMI) 27.0-27.9, adult: Secondary | ICD-10-CM | POA: Diagnosis not present

## 2018-01-04 DIAGNOSIS — K921 Melena: Secondary | ICD-10-CM | POA: Diagnosis not present

## 2018-01-04 DIAGNOSIS — I10 Essential (primary) hypertension: Secondary | ICD-10-CM | POA: Diagnosis not present

## 2018-01-16 DIAGNOSIS — M47812 Spondylosis without myelopathy or radiculopathy, cervical region: Secondary | ICD-10-CM | POA: Diagnosis not present

## 2018-01-17 DIAGNOSIS — Z7901 Long term (current) use of anticoagulants: Secondary | ICD-10-CM | POA: Diagnosis not present

## 2018-01-17 DIAGNOSIS — Z6826 Body mass index (BMI) 26.0-26.9, adult: Secondary | ICD-10-CM | POA: Diagnosis not present

## 2018-01-17 DIAGNOSIS — M542 Cervicalgia: Secondary | ICD-10-CM | POA: Diagnosis not present

## 2018-01-17 DIAGNOSIS — I10 Essential (primary) hypertension: Secondary | ICD-10-CM | POA: Diagnosis not present

## 2018-01-17 DIAGNOSIS — I482 Chronic atrial fibrillation, unspecified: Secondary | ICD-10-CM | POA: Diagnosis not present

## 2018-01-17 DIAGNOSIS — K921 Melena: Secondary | ICD-10-CM | POA: Diagnosis not present

## 2018-01-18 ENCOUNTER — Encounter: Payer: Self-pay | Admitting: Physician Assistant

## 2018-01-18 ENCOUNTER — Ambulatory Visit (INDEPENDENT_AMBULATORY_CARE_PROVIDER_SITE_OTHER): Payer: Medicare Other | Admitting: Physician Assistant

## 2018-01-18 VITALS — BP 160/70 | HR 78 | Ht 64.0 in | Wt 159.0 lb

## 2018-01-18 DIAGNOSIS — K219 Gastro-esophageal reflux disease without esophagitis: Secondary | ICD-10-CM | POA: Diagnosis not present

## 2018-01-18 DIAGNOSIS — R110 Nausea: Secondary | ICD-10-CM | POA: Diagnosis not present

## 2018-01-18 DIAGNOSIS — K625 Hemorrhage of anus and rectum: Secondary | ICD-10-CM

## 2018-01-18 DIAGNOSIS — K648 Other hemorrhoids: Secondary | ICD-10-CM

## 2018-01-18 MED ORDER — HYDROCORTISONE ACETATE 25 MG RE SUPP: 25.0000 mg | Freq: Two times a day (BID) | RECTAL | 1 refills | Status: DC

## 2018-01-18 MED ORDER — FAMOTIDINE 20 MG PO TABS: 20.0000 mg | ORAL_TABLET | Freq: Two times a day (BID) | ORAL | 11 refills | Status: DC

## 2018-01-18 NOTE — Patient Instructions (Addendum)
If you are age 82 or older, your body mass index should be between 23-30. Your Body mass index is 27.29 kg/m. If this is out of the aforementioned range listed, please consider follow up with your Primary Care Provider.  If you are age 81 or younger, your body mass index should be between 19-25. Your Body mass index is 27.29 kg/m. If this is out of the aformentioned range listed, please consider follow up with your Primary Care Provider.   We have sent the following medications to your pharmacy for you to pick up at your convenience: Pepcid 20 mg twice daily.  Anusol Suppositories twice daily for 7 days.  Start Fiber 25-35 grams daily ie. Benefiber, Metamucil, Citrucel.  Follow up with me on February 15, 2018 at 9:45 a.m.  Thank you for choosing me and [facility].   Ellouise Newer, PA-C

## 2018-01-18 NOTE — Progress Notes (Addendum)
Chief Complaint: Rectal bleeding  HPI:    [name] is a 84 year old Caucasian female with a past medical history of A. fib, status post pacemaker on Coumadin, as well as multiple others listed below, assigned to Dr. Arelia Longest at last visit, who presents clinic today with a complaint of rectal bleeding.    04/14/2017 patient seen in the office by me for nausea and reflux.  At that time, she reported a colonoscopy in 1999 at [facility].  That time she had a barium esophagram with upper GI series prior to likely EGD.  She was prescribed Zofran every 4-6 hours.  She declined a PPI and H2 blocker.  Was noted that previous CT cervical spine suggested she had a Zenker's diverticulum and also had a chronic goiter (large) which could be related to symptoms.    06/24/2017 EGD with a diverticulum in the upper third of the esophagus and small hiatal hernia.  At time of EGD described increased rectal bleeding.    08/24/2017 patient was told she could take up to 300 mg of Zantac at bedtime she thought this is helping.    Today, the patient starts by explaining that she was using Zantac 150 mg twice daily which was helping with her nausea and epigastric symptoms but recently went to pick up her prescription from the pharmacy and they told her not to take this anymore.  Instead she has been using 2 Tums at least twice a day which only helps a little bit.    Main complaint today is of rectal bleeding.  For the past 3 weeks she has been experiencing some bright red blood per rectum whenever she has a bowel movement which seemed to be just "pouring out".  Tells me she would stand up from having a bowel movement and would still have some dripping of bright red blood and this was every time she went to the bathroom.  Tells me she did have a change in bowel habits around that time noting that she would have one solid bowel movement and then possibly have 3 more smaller ones throughout the morning and this blood would occur every  time.  Most recently over the past week the blood has almost stopped and is now "just there when I wipe".  Patient tells me she does continue with slightly more bowel movements than normal, but these are also getting some better.  Denies any rectal or abdominal pain.    Of note patient tells me she recently had labs drawn at her PCP and her iron and other levels were normal.    Denies fever, chills, weight loss, anorexia, nausea, vomiting or symptoms that awaken her from sleep.  Past Medical History:  Diagnosis Date  . Anxiety   . Atrial fibrillation ([place])   . Bradycardia   . Cancer ([place])    skin  . Cataract    removed bilat with lens both eyes   . Complication of anesthesia   . Difficulty sleeping   . Dysrhythmia    A FIB  . GERD (gastroesophageal reflux disease)   . H/O hiatal hernia   . History of kidney stones   . History of skin cancer   . History of transfusion   . Hyperlipidemia   . Hypertension   . Osteoarthritis   . Pacemaker   . Pneumonia   . PONV (postoperative nausea and vomiting)   . Shortness of breath    WITH EXERTION    Past Surgical History:  Procedure Laterality Date  . APPENDECTOMY  1960  . ARTHROPLASTY  1994   Left total knee  . ARTHROPLASTY  2000   Total right knee  . BACK SURGERY    . CATARACT EXTRACTION, BILATERAL     with lens implants  . CHOLECYSTECTOMY    . COLONOSCOPY    . DG SELECTED HSG GDC ONLY  2002   Dilation  . Microdiskectomy  05/2004   Left L4-L5  . PACEMAKER INSERTION    . POLYPECTOMY    . TOTAL ABDOMINAL HYSTERECTOMY  1969  . TOTAL HIP ARTHROPLASTY Right 12/24/2013   Procedure: RIGHT TOTAL HIP ARTHROPLASTY ANTERIOR APPROACH;  Surgeon: Gearlean Alf, MD;  Location: [facility];  Service: Orthopedics;  Laterality: Right;  . UPPER GASTROINTESTINAL ENDOSCOPY      Current Outpatient Medications  Medication Sig Dispense Refill  . acetaminophen (TYLENOL) 325 MG tablet Take 650 mg by mouth every 6 (six) hours as needed (pain).    Marland Kitchen  amLODipine (NORVASC) 10 MG tablet Take 10 mg by mouth every morning.     . clonazePAM (KLONOPIN) 0.5 MG tablet Take 0.5 mg by mouth daily as needed for anxiety.     . cyclobenzaprine (FLEXERIL) 5 MG tablet Take 1 tablet (5 mg total) by mouth 3 (three) times daily as needed for muscle spasms. 15 tablet 0  . irbesartan (AVAPRO) 300 MG tablet Take 300 mg by mouth every morning.    . methimazole (TAPAZOLE) 5 MG tablet TAKE ONE TABLET BY MOUTH DAILY WITH FOOD  0  . ondansetron (ZOFRAN) 4 MG tablet Take 1 tablet (4 mg total) by mouth every 4 (four) hours as needed for nausea or vomiting. 20 tablet 1  . polyethylene glycol (MIRALAX / GLYCOLAX) packet Take 17 g by mouth daily as needed. 14 each 0  . potassium chloride SA (K-DUR,KLOR-CON) 20 MEQ tablet Take 20 mEq by mouth every morning.     . pravastatin (PRAVACHOL) 40 MG tablet Take 40 mg by mouth every morning.     Marland Kitchen PREVIDENT 5000 PLUS 1.1 % CREA dental cream Take 1 application by mouth at bedtime. As directed    . ranitidine (ZANTAC) 150 MG capsule Take 150 mg by mouth daily.    . traMADol (ULTRAM) 50 MG tablet Take 2 tablets (100 mg total) by mouth every 6 (six) hours as needed for moderate pain. 10 tablet 0  . triamterene-hydrochlorothiazide (DYAZIDE) 37.5-25 MG capsule Take one capsule by mouth daily 90 capsule 2  . warfarin (COUMADIN) 2 MG tablet Take Coumadin for three weeks for postoperative protocol and then the patient may resume their previous Coumadin home regimen.  The dose may need to be adjusted based upon the INR.  Please follow the INR and titrate Coumadin dose for a therapeutic range between 2.0 and 3.0 INR.  After completing the three weeks of Coumadin, the patient may resume their previous Coumadin home regimen.   CURRENTLY HOLDING THE COUMADIN FOR THE CORTISONE INJECTION (Patient taking differently: Take 1 tablet daily except Monday and Thursday (takes 2 tabs these days)) 30 tablet 0   Current Facility-Administered Medications    Medication Dose Route Frequency Provider Last Rate Last Dose  . 0.9 %  sodium chloride infusion  500 mL Intravenous Once Gatha Mayer, MD      . 0.9 %  sodium chloride infusion  500 mL Intravenous Once Gatha Mayer, MD        Allergies as of 01/18/2018 - Review Complete 12/26/2017  Allergen  Reaction Noted  . Morphine and related Nausea And Vomiting 01/08/2012    Family History  Problem Relation Age of Onset  . Heart disease Mother   . Leukemia Father   . Colon cancer Neg Hx   . Esophageal cancer Neg Hx   . Stomach cancer Neg Hx   . Pancreatic cancer Neg Hx   . Liver disease Neg Hx   . Colon polyps Neg Hx     Social History   Socioeconomic History  . Marital status: Married    Spouse name: Not on file  . Number of children: Not on file  . Years of education: Not on file  . Highest education level: Not on file  Occupational History  . Not on file  Social Needs  . Financial resource strain: Not on file  . Food insecurity:    Worry: Not on file    Inability: Not on file  . Transportation needs:    Medical: Not on file    Non-medical: Not on file  Tobacco Use  . Smoking status: Former Smoker    Last attempt to quit: 12/18/1973    Years since quitting: 44.1  . Smokeless tobacco: Never Used  . Tobacco comment: Smoker for 25 years and quit at around age 65  Substance and Sexual Activity  . Alcohol use: No  . Drug use: No  . Sexual activity: Not on file  Lifestyle  . Physical activity:    Days per week: Not on file    Minutes per session: Not on file  . Stress: Not on file  Relationships  . Social connections:    Talks on phone: Not on file    Gets together: Not on file    Attends religious service: Not on file    Active member of club or organization: Not on file    Attends meetings of clubs or organizations: Not on file    Relationship status: Not on file  . Intimate partner violence:    Fear of current or ex partner: Not on file    Emotionally abused:  Not on file    Physically abused: Not on file    Forced sexual activity: Not on file  Other Topics Concern  . Not on file  Social History Narrative   Married, husband's name is Warner Mccreedy, they have been married since 1950   2 sons and 4 grandchildren          Review of Systems:    Constitutional: No weight loss, fever or chills Cardiovascular: No chest pain Respiratory: No SOB  Gastrointestinal: See HPI and otherwise negative   Physical Exam:  Vital signs: BP (!) 160/70   Pulse 78   Ht 5\' 4"  (1.626 m)   Wt 159 lb (72.1 kg)   BMI 27.29 kg/m   Constitutional:   Pleasant Elderly Caucasian female appears to be in NAD, Well developed, Well nourished, alert and cooperative Respiratory: Respirations even and unlabored. Lungs clear to auscultation bilaterally.   No wheezes, crackles, or rhonchi.  Cardiovascular: Normal S1, S2. No MRG. Regular rate and rhythm. No peripheral edema, cyanosis or pallor.  Gastrointestinal:  Soft, nondistended, nontender. No rebound or guarding. Normal bowel sounds. No appreciable masses or hepatomegaly. Rectal:  External: multiple external hemorrhoid tags; Internal: no ttp, no brb; Anoscopy: Inflamed Grade II hemorhoids Psychiatric: Demonstrates good judgement and reason without abnormal affect or behaviors.  Requesting recent labs.  Assessment: 1.  Rectal bleeding: Over the past 3 weeks, some slowing over  the past week, now just when the patient wipes with a bowel movement, internal hemorrhoids on exam which are likely cause 2.  Nausea/epigastric pain: Zenker's diverticulum, was better on Zantac 150 mg twice daily, patient stopped this recently due to new findings of cancer causing agents, now using Tums 2 tabs twice daily which does not help as much 3.  Internal hemorrhoids  Plan: 1.  Recommend the patient start Pepcid 20 mg twice daily, every morning and nightly #60 with 11 refills 2.  Prescribed Hydrocortisone suppositories twice daily x7 days with 1  refill for internal hemorrhoids 3.  Would recommend patient increase fiber in her diet to at least 25-35 g/day with use of a fiber segment such as Metamucil, Citrucel or Benefiber. 4.  Patient to follow in clinic with me in 3 to 4 weeks or sooner if necessary.  Ellouise Newer, PA-C [facility] 01/18/2018, 9:51 AM  Cc: [name], MD   Addendum: 01/18/2018 1131  See labs from PCP 01/17/2018 with a hemoglobin of 10.2, MCV normal at 93.5  No change in plans  Ellouise Newer, [place]

## 2018-01-31 ENCOUNTER — Encounter: Payer: Medicare Other | Admitting: Internal Medicine

## 2018-02-02 DIAGNOSIS — I482 Chronic atrial fibrillation, unspecified: Secondary | ICD-10-CM | POA: Diagnosis not present

## 2018-02-02 DIAGNOSIS — Z7901 Long term (current) use of anticoagulants: Secondary | ICD-10-CM | POA: Diagnosis not present

## 2018-02-09 ENCOUNTER — Ambulatory Visit (INDEPENDENT_AMBULATORY_CARE_PROVIDER_SITE_OTHER): Payer: Medicare Other | Admitting: Pharmacist Clinician (PhC)/ Clinical Pharmacy Specialist

## 2018-02-09 DIAGNOSIS — I4891 Unspecified atrial fibrillation: Secondary | ICD-10-CM | POA: Diagnosis not present

## 2018-02-09 DIAGNOSIS — Z7901 Long term (current) use of anticoagulants: Secondary | ICD-10-CM

## 2018-02-09 DIAGNOSIS — M47812 Spondylosis without myelopathy or radiculopathy, cervical region: Secondary | ICD-10-CM | POA: Diagnosis not present

## 2018-02-09 LAB — POCT INR: INR: 1 — AB (ref 2.0–3.0)

## 2018-02-09 NOTE — Patient Instructions (Signed)
Description   Restart per instructions from [facility] Pharm[facility].

## 2018-02-14 DIAGNOSIS — Z7901 Long term (current) use of anticoagulants: Secondary | ICD-10-CM | POA: Diagnosis not present

## 2018-02-14 DIAGNOSIS — I482 Chronic atrial fibrillation, unspecified: Secondary | ICD-10-CM | POA: Diagnosis not present

## 2018-02-14 DIAGNOSIS — M542 Cervicalgia: Secondary | ICD-10-CM | POA: Diagnosis not present

## 2018-02-15 ENCOUNTER — Encounter: Payer: Self-pay | Admitting: Physician Assistant

## 2018-02-15 ENCOUNTER — Ambulatory Visit (INDEPENDENT_AMBULATORY_CARE_PROVIDER_SITE_OTHER): Payer: Medicare Other | Admitting: Physician Assistant

## 2018-02-15 VITALS — BP 124/62 | HR 60 | Ht 62.0 in | Wt 161.0 lb

## 2018-02-15 DIAGNOSIS — K625 Hemorrhage of anus and rectum: Secondary | ICD-10-CM

## 2018-02-15 DIAGNOSIS — R110 Nausea: Secondary | ICD-10-CM

## 2018-02-15 NOTE — Progress Notes (Signed)
Chief Complaint: Follow-up rectal bleeding and nausea  HPI:    [name] is a 84 year old Caucasian female with a past medical history as listed below including A. fib on Coumadin, known to Dr. Carlean Purl, who presents to clinic today for follow-up of her rectal bleeding and nausea.    01/18/2018 office visit with me to discuss rectal bleeding for 3 weeks described as "pouring out" as well as nausea after stopping her Zantac.  At that time, anoscopy with inflamed grade 2 hemorrhoids, patient was prescribed Hydrocortisone suppositories twice daily x7 days and told to start Pepcid 20 mg twice daily.  Also recommend she increase fiber in her diet.    Today, patient presents clinic and tells me that all of her symptoms are completely better.  She used the suppositories twice daily for a week and has seen no further "pouring" of blood.  Tells me that occasionally if she wipes too hard or "scrubs back there" she will see small streaks of bright red blood but nothing like it was.    Has started using Pepcid twice a day and no longer has any nausea.    Does report today that she may have 3 separate bowel movements in the morning which are still solid but just "not all together".    Social history positive for having 2 sons, she lives in between them.    Denies fever, chills, weight loss, anorexia, nausea, vomiting, heartburn or reflux.  Past Medical History:  Diagnosis Date  . Anxiety   . Atrial fibrillation ([place])   . Bradycardia   . Cancer ([place])    skin  . Cataract    removed bilat with lens both eyes   . Complication of anesthesia   . Difficulty sleeping   . Dysrhythmia    A FIB  . GERD (gastroesophageal reflux disease)   . H/O hiatal hernia   . History of kidney stones   . History of skin cancer   . History of transfusion   . Hyperlipidemia   . Hypertension   . Osteoarthritis   . Pacemaker   . Pneumonia   . PONV (postoperative nausea and vomiting)   . Shortness of breath    WITH  EXERTION    Past Surgical History:  Procedure Laterality Date  . APPENDECTOMY  1960  . ARTHROPLASTY  1994   Left total knee  . ARTHROPLASTY  2000   Total right knee  . BACK SURGERY    . CATARACT EXTRACTION, BILATERAL     with lens implants  . CHOLECYSTECTOMY    . COLONOSCOPY    . DG SELECTED HSG GDC ONLY  2002   Dilation  . Microdiskectomy  05/2004   Left L4-L5  . PACEMAKER INSERTION    . POLYPECTOMY    . TOTAL ABDOMINAL HYSTERECTOMY  1969  . TOTAL HIP ARTHROPLASTY Right 12/24/2013   Procedure: RIGHT TOTAL HIP ARTHROPLASTY ANTERIOR APPROACH;  Surgeon: Gearlean Alf, MD;  Location: [facility];  Service: Orthopedics;  Laterality: Right;  . UPPER GASTROINTESTINAL ENDOSCOPY      Current Outpatient Medications  Medication Sig Dispense Refill  . acetaminophen (TYLENOL) 325 MG tablet Take 650 mg by mouth every 6 (six) hours as needed (pain).    Marland Kitchen amLODipine (NORVASC) 10 MG tablet Take 10 mg by mouth every morning.     . clonazePAM (KLONOPIN) 0.5 MG tablet Take 0.5 mg by mouth daily as needed for anxiety.     . famotidine (PEPCID) 20 MG tablet Take  1 tablet (20 mg total) by mouth 2 (two) times daily. 60 tablet 11  . hydrocortisone (ANUSOL-HC) 25 MG suppository Place 1 suppository (25 mg total) rectally 2 (two) times daily. Use for 7 days. 14 suppository 1  . irbesartan (AVAPRO) 300 MG tablet Take 300 mg by mouth every morning.    . methimazole (TAPAZOLE) 5 MG tablet Take 5 mg by mouth 2 (two) times daily.   0  . ondansetron (ZOFRAN) 4 MG tablet Take 1 tablet (4 mg total) by mouth every 4 (four) hours as needed for nausea or vomiting. 20 tablet 1  . potassium chloride SA (K-DUR,KLOR-CON) 20 MEQ tablet Take 20 mEq by mouth every morning.     . pravastatin (PRAVACHOL) 40 MG tablet Take 40 mg by mouth every morning.     Marland Kitchen PREVIDENT 5000 PLUS 1.1 % CREA dental cream Take 1 application by mouth at bedtime. As directed    . triamterene-hydrochlorothiazide (DYAZIDE) 37.5-25 MG capsule Take one  capsule by mouth daily 90 capsule 2  . warfarin (COUMADIN) 2 MG tablet Take Coumadin for three weeks for postoperative protocol and then the patient may resume their previous Coumadin home regimen.  The dose may need to be adjusted based upon the INR.  Please follow the INR and titrate Coumadin dose for a therapeutic range between 2.0 and 3.0 INR.  After completing the three weeks of Coumadin, the patient may resume their previous Coumadin home regimen.   CURRENTLY HOLDING THE COUMADIN FOR THE CORTISONE INJECTION (Patient taking differently: Take 1 tablet daily except Monday and Thursday (takes 2 tabs these days)) 30 tablet 0   No current facility-administered medications for this visit.     Allergies as of 02/15/2018 - Review Complete 02/15/2018  Allergen Reaction Noted  . Morphine and related Nausea And Vomiting 01/08/2012    Family History  Problem Relation Age of Onset  . Heart disease Mother   . Leukemia Father   . Colon cancer Neg Hx   . Esophageal cancer Neg Hx   . Stomach cancer Neg Hx   . Pancreatic cancer Neg Hx   . Liver disease Neg Hx   . Colon polyps Neg Hx     Social History   Socioeconomic History  . Marital status: Widowed    Spouse name: Not on file  . Number of children: 2  . Years of education: Not on file  . Highest education level: Not on file  Occupational History  . Occupation: retired  Scientific laboratory technician  . Financial resource strain: Not on file  . Food insecurity:    Worry: Not on file    Inability: Not on file  . Transportation needs:    Medical: Not on file    Non-medical: Not on file  Tobacco Use  . Smoking status: Former Smoker    Last attempt to quit: 12/18/1973    Years since quitting: 44.1  . Smokeless tobacco: Never Used  . Tobacco comment: Smoker for 25 years and quit at around age 65  Substance and Sexual Activity  . Alcohol use: No  . Drug use: No  . Sexual activity: Not Currently  Lifestyle  . Physical activity:    Days per week:  Not on file    Minutes per session: Not on file  . Stress: Not on file  Relationships  . Social connections:    Talks on phone: Not on file    Gets together: Not on file    Attends religious service: Not  on file    Active member of club or organization: Not on file    Attends meetings of clubs or organizations: Not on file    Relationship status: Not on file  . Intimate partner violence:    Fear of current or ex partner: Not on file    Emotionally abused: Not on file    Physically abused: Not on file    Forced sexual activity: Not on file  Other Topics Concern  . Not on file  Social History Narrative   Married, husband's name is Warner Mccreedy, they have been married since 1950   2 sons and 4 grandchildren          Review of Systems:    Constitutional: No weight loss, fever or chills Cardiovascular: No chest pain Respiratory: No SOB  Gastrointestinal: See HPI and otherwise negative   Physical Exam:  Vital signs: BP 124/62   Pulse 60   Ht 5\' 2"  (1.575 m)   Wt 161 lb (73 kg)   BMI 29.45 kg/m   Constitutional:   Pleasant Elderly Caucasian female appears to be in NAD, Well developed, Well nourished, alert and cooperative Respiratory: Respirations even and unlabored. Lungs clear to auscultation bilaterally.   No wheezes, crackles, or rhonchi.  Cardiovascular: Normal S1, S2. No MRG. Regular rate and rhythm. No peripheral edema, cyanosis or pallor.  Gastrointestinal:  Soft, nondistended, nontender. No rebound or guarding. Normal bowel sounds. No appreciable masses or hepatomegaly. Psychiatric: Demonstrates good judgement and reason without abnormal affect or behaviors.  No recent labs.  Assessment: 1.  Rectal bleeding: Internal hemorrhoids on anoscopy at last visit, resolved after Hydrocortisone suppositories twice daily x1 week 2.  Nausea: Resolved after starting Pepcid 20 mg twice daily  Plan: 1.  Patient symptoms are completely better after a week of suppositories and change to  Pepcid 20 mg twice daily. 2.  Did encourage patient to increase fiber in her diet again.  Discussed Metamucil, Citrucel or Benefiber on a daily basis. 3.  Patient to follow in clinic as needed in the future with Dr. Carlean Purl or myself.  Ellouise Newer, PA-C [facility] 02/15/2018, 10:03 AM  Cc: [name], MD

## 2018-02-15 NOTE — Patient Instructions (Signed)
If you are age 82 or older, your body mass index should be between 23-30. Your Body mass index is 29.45 kg/m. If this is out of the aforementioned range listed, please consider follow up with your Primary Care Provider.  If you are age 64 or younger, your body mass index should be between 19-25. Your Body mass index is 29.45 kg/m. If this is out of the aformentioned range listed, please consider follow up with your Primary Care Provider.   Please start a fiber supplement such as metamucil, Citrucel or Benefiber.   Thank you for choosing me and [facility].   Ellouise Newer, [place]

## 2018-02-28 DIAGNOSIS — Z7901 Long term (current) use of anticoagulants: Secondary | ICD-10-CM | POA: Diagnosis not present

## 2018-02-28 DIAGNOSIS — M542 Cervicalgia: Secondary | ICD-10-CM | POA: Diagnosis not present

## 2018-02-28 DIAGNOSIS — I482 Chronic atrial fibrillation, unspecified: Secondary | ICD-10-CM | POA: Diagnosis not present

## 2018-03-02 DIAGNOSIS — M79671 Pain in right foot: Secondary | ICD-10-CM | POA: Diagnosis not present

## 2018-03-02 DIAGNOSIS — M19071 Primary osteoarthritis, right ankle and foot: Secondary | ICD-10-CM | POA: Diagnosis not present

## 2018-03-15 DIAGNOSIS — Z7901 Long term (current) use of anticoagulants: Secondary | ICD-10-CM | POA: Diagnosis not present

## 2018-03-15 DIAGNOSIS — Z6828 Body mass index (BMI) 28.0-28.9, adult: Secondary | ICD-10-CM | POA: Diagnosis not present

## 2018-03-15 DIAGNOSIS — M5412 Radiculopathy, cervical region: Secondary | ICD-10-CM | POA: Diagnosis not present

## 2018-03-15 DIAGNOSIS — M542 Cervicalgia: Secondary | ICD-10-CM | POA: Diagnosis not present

## 2018-03-15 DIAGNOSIS — I4821 Permanent atrial fibrillation: Secondary | ICD-10-CM | POA: Diagnosis not present

## 2018-03-21 ENCOUNTER — Encounter (INDEPENDENT_AMBULATORY_CARE_PROVIDER_SITE_OTHER): Payer: Self-pay

## 2018-03-21 ENCOUNTER — Ambulatory Visit (INDEPENDENT_AMBULATORY_CARE_PROVIDER_SITE_OTHER): Payer: Medicare Other | Admitting: Pharmacist

## 2018-03-21 DIAGNOSIS — Z7901 Long term (current) use of anticoagulants: Secondary | ICD-10-CM

## 2018-03-21 DIAGNOSIS — M47812 Spondylosis without myelopathy or radiculopathy, cervical region: Secondary | ICD-10-CM | POA: Diagnosis not present

## 2018-03-21 DIAGNOSIS — I4891 Unspecified atrial fibrillation: Secondary | ICD-10-CM

## 2018-03-21 LAB — POCT INR: INR: 1 — AB (ref 2.0–3.0)

## 2018-03-21 NOTE — Patient Instructions (Signed)
Restart per instructions from [facility] PharmD.

## 2018-03-24 DIAGNOSIS — I4821 Permanent atrial fibrillation: Secondary | ICD-10-CM | POA: Diagnosis not present

## 2018-03-24 DIAGNOSIS — M542 Cervicalgia: Secondary | ICD-10-CM | POA: Diagnosis not present

## 2018-03-24 DIAGNOSIS — R5381 Other malaise: Secondary | ICD-10-CM | POA: Diagnosis not present

## 2018-03-24 DIAGNOSIS — Z7901 Long term (current) use of anticoagulants: Secondary | ICD-10-CM | POA: Diagnosis not present

## 2018-03-24 DIAGNOSIS — Z6827 Body mass index (BMI) 27.0-27.9, adult: Secondary | ICD-10-CM | POA: Diagnosis not present

## 2018-03-27 ENCOUNTER — Emergency Department (HOSPITAL_COMMUNITY): Payer: Medicare Other

## 2018-03-27 ENCOUNTER — Emergency Department (HOSPITAL_COMMUNITY)
Admission: EM | Admit: 2018-03-27 | Discharge: 2018-03-27 | Disposition: A | Discharge status: Home / Self Care | Payer: Medicare Other | Attending: Emergency Medicine | Admitting: Emergency Medicine

## 2018-03-27 ENCOUNTER — Other Ambulatory Visit: Payer: Self-pay

## 2018-03-27 DIAGNOSIS — S199XXA Unspecified injury of neck, initial encounter: Secondary | ICD-10-CM | POA: Diagnosis not present

## 2018-03-27 DIAGNOSIS — R791 Abnormal coagulation profile: Secondary | ICD-10-CM | POA: Diagnosis not present

## 2018-03-27 DIAGNOSIS — Z950 Presence of cardiac pacemaker: Secondary | ICD-10-CM | POA: Diagnosis not present

## 2018-03-27 DIAGNOSIS — I10 Essential (primary) hypertension: Secondary | ICD-10-CM | POA: Diagnosis not present

## 2018-03-27 DIAGNOSIS — R531 Weakness: Secondary | ICD-10-CM | POA: Diagnosis not present

## 2018-03-27 DIAGNOSIS — Z96641 Presence of right artificial hip joint: Secondary | ICD-10-CM | POA: Insufficient documentation

## 2018-03-27 DIAGNOSIS — R55 Syncope and collapse: Secondary | ICD-10-CM | POA: Insufficient documentation

## 2018-03-27 DIAGNOSIS — E871 Hypo-osmolality and hyponatremia: Secondary | ICD-10-CM | POA: Diagnosis not present

## 2018-03-27 DIAGNOSIS — S0990XA Unspecified injury of head, initial encounter: Secondary | ICD-10-CM | POA: Diagnosis not present

## 2018-03-27 DIAGNOSIS — Z96653 Presence of artificial knee joint, bilateral: Secondary | ICD-10-CM | POA: Insufficient documentation

## 2018-03-27 DIAGNOSIS — G8929 Other chronic pain: Secondary | ICD-10-CM | POA: Diagnosis not present

## 2018-03-27 DIAGNOSIS — R51 Headache: Secondary | ICD-10-CM | POA: Insufficient documentation

## 2018-03-27 DIAGNOSIS — R5381 Other malaise: Secondary | ICD-10-CM | POA: Diagnosis not present

## 2018-03-27 DIAGNOSIS — Z85828 Personal history of other malignant neoplasm of skin: Secondary | ICD-10-CM | POA: Diagnosis not present

## 2018-03-27 DIAGNOSIS — M542 Cervicalgia: Secondary | ICD-10-CM | POA: Diagnosis not present

## 2018-03-27 DIAGNOSIS — R52 Pain, unspecified: Secondary | ICD-10-CM | POA: Diagnosis not present

## 2018-03-27 DIAGNOSIS — Z79899 Other long term (current) drug therapy: Secondary | ICD-10-CM | POA: Insufficient documentation

## 2018-03-27 DIAGNOSIS — Z87891 Personal history of nicotine dependence: Secondary | ICD-10-CM | POA: Diagnosis not present

## 2018-03-27 LAB — BASIC METABOLIC PANEL
Anion gap: 9 (ref 5–15)
BUN: 14 mg/dL (ref 8–23)
CO2: 24 mmol/L (ref 22–32)
Calcium: 8.7 mg/dL — ABNORMAL LOW (ref 8.9–10.3)
Chloride: 91 mmol/L — ABNORMAL LOW (ref 98–111)
Creatinine, Ser: 0.81 mg/dL (ref 0.44–1.00)
GFR calc Af Amer: >60 mL/min (ref >60)
GFR calc non Af Amer: >60 mL/min (ref >60)
Glucose, Bld: 129 mg/dL — ABNORMAL HIGH (ref 70–99)
POTASSIUM: 4.7 mmol/L (ref 3.5–5.1)
Sodium: 124 mmol/L — ABNORMAL LOW (ref 135–145)

## 2018-03-27 LAB — URINALYSIS, ROUTINE W REFLEX MICROSCOPIC
Bilirubin Urine: NEGATIVE
Glucose, UA: NEGATIVE mg/dL
Hgb urine dipstick: NEGATIVE
KETONES UR: NEGATIVE mg/dL
Nitrite: NEGATIVE
Protein, ur: NEGATIVE mg/dL
Specific Gravity, Urine: 1.003 — ABNORMAL LOW (ref 1.005–1.030)
pH: 7 (ref 5.0–8.0)

## 2018-03-27 LAB — CBC
HCT: 35.7 % — ABNORMAL LOW (ref 36.0–46.0)
Hemoglobin: 11.6 g/dL — ABNORMAL LOW (ref 12.0–15.0)
MCH: 29.5 pg (ref 26.0–34.0)
MCHC: 32.5 g/dL (ref 30.0–36.0)
MCV: 90.8 fL (ref 80.0–100.0)
NRBC: 0 % (ref 0.0–0.2)
Platelets: 274 10*3/uL (ref 150–400)
RBC: 3.93 MIL/uL (ref 3.87–5.11)
RDW: 13.9 % (ref 11.5–15.5)
WBC: 9.2 10*3/uL (ref 4.0–10.5)

## 2018-03-27 LAB — PROTIME-INR
INR: 1.37
Prothrombin Time: 16.8 seconds — ABNORMAL HIGH (ref 11.4–15.2)

## 2018-03-27 LAB — POC OCCULT BLOOD, ED: Fecal Occult Bld: NEGATIVE

## 2018-03-27 LAB — CBG MONITORING, ED: Glucose-Capillary: 126 mg/dL — ABNORMAL HIGH (ref 70–99)

## 2018-03-27 MED ORDER — SODIUM CHLORIDE 0.9 % IV BOLUS
1000.0000 mL | Freq: Once | INTRAVENOUS | Status: AC
  Administered 2018-03-27: 1000 mL via INTRAVENOUS

## 2018-03-27 NOTE — ED Notes (Signed)
[id number] verity. Not compatible with ours. Rep will be sent out.

## 2018-03-27 NOTE — ED Notes (Signed)
IV attempt x2 unsuccessful but able to draw labs from attempted site.

## 2018-03-27 NOTE — ED Notes (Signed)
Tried to Sales [employer] account executive but is not crossing over to [facility].  Rep has been pages Shearon Stalls). Representative states that it could take a while for it to transfer over.

## 2018-03-27 NOTE — ED Provider Notes (Addendum)
[facility] DEPT Provider Note   CSN: [id number] Arrival date & time: 03/27/18  1047     History   Chief Complaint Chief Complaint  Patient presents with  . Fall  . Nausea  . Weakness    HPI [name] is a 84 y.o. female.  Patient reports she had syncopal event yesterday.  She vomited twice yesterday and was on the floor for approximately 45 minutes till a family member found her.  She felt generally weak prior to the syncopal event.  No fever.  No abdominal pain.  She presently complains of headache which is occipital and chronic.  No diarrhea.  No blood per rectum.  No other associated symptoms.  She recently had her neck injected by Dr. Nelva Bush for treatment of chronic headaches.  She denies nausea at present.  Denies hematemesis.  Nothing makes symptoms better or worse.  No treatment prior to coming here.  She did drink water and eat oatmeal this morning HPI  Past Medical History:  Diagnosis Date  . Anxiety   . Atrial fibrillation ([place])   . Bradycardia   . Cancer ([place])    skin  . Cataract    removed bilat with lens both eyes   . Complication of anesthesia   . Difficulty sleeping   . Dysrhythmia    A FIB  . GERD (gastroesophageal reflux disease)   . H/O hiatal hernia   . History of kidney stones   . History of skin cancer   . History of transfusion   . Hyperlipidemia   . Hypertension   . Osteoarthritis   . Pacemaker   . Pneumonia   . PONV (postoperative nausea and vomiting)   . Shortness of breath    WITH EXERTION    Patient Active Problem List   Diagnosis Date Noted  . Complication of anesthesia   . Intractable headache 01/06/2017  . Intractable pain 01/04/2017  . Chronic anticoagulation 01/03/2017  . Neck pain 01/03/2017  . Acute bronchitis 06/08/2016  . Fall 09/24/2015  . Dizziness 09/24/2015  . Postoperative anemia due to acute blood loss 12/27/2013  . Postop Transfusion 12/27/2013  . OA (osteoarthritis) of hip  12/24/2013  . Preop cardiovascular exam 09/25/2013  . Cardiac pacemaker in situ 08/29/2008  . Essential hypertension 08/21/2008  . ATRIAL FIBRILLATION 08/21/2008  . BRADYCARDIA 08/21/2008  . PNEUMONIA 08/21/2008  . GERD 08/21/2008  . NEPHROLITHIASIS 08/21/2008  . OSTEOARTHRITIS 08/21/2008    Past Surgical History:  Procedure Laterality Date  . APPENDECTOMY  1960  . ARTHROPLASTY  1994   Left total knee  . ARTHROPLASTY  2000   Total right knee  . BACK SURGERY    . CATARACT EXTRACTION, BILATERAL     with lens implants  . CHOLECYSTECTOMY    . COLONOSCOPY    . DG SELECTED [facility] ONLY  2002   Dilation  . Microdiskectomy  05/2004   Left L4-L5  . PACEMAKER INSERTION    . POLYPECTOMY    . TOTAL ABDOMINAL HYSTERECTOMY  1969  . TOTAL HIP ARTHROPLASTY Right 12/24/2013   Procedure: RIGHT TOTAL HIP ARTHROPLASTY ANTERIOR APPROACH;  Surgeon: Gearlean Alf, MD;  Location: [facility] ORS;  Service: Orthopedics;  Laterality: Right;  . UPPER GASTROINTESTINAL ENDOSCOPY       OB History   No obstetric history on file.      Home Medications    Prior to Admission medications   Medication Sig Start Date End Date Taking? Authorizing Provider  traMADol (ULTRAM) 50 MG tablet Take 25-50 mg by mouth every 8 (eight) hours as needed for moderate pain.    Yes [provider]  acetaminophen (TYLENOL) 325 MG tablet Take 650 mg by mouth every 6 (six) hours as needed (pain).    [provider]  amLODipine (NORVASC) 10 MG tablet Take 10 mg by mouth every morning.     [provider]  clonazePAM (KLONOPIN) 0.5 MG tablet Take 0.5 mg by mouth daily as needed for anxiety.  03/18/15   [provider]  enoxaparin (LOVENOX) 80 MG/0.8ML injection Inject 70 mg into the skin 2 (two) times daily. 02/02/18   [provider]  famotidine (PEPCID) 20 MG tablet Take 1 tablet (20 mg total) by mouth 2 (two) times daily. 01/18/18   Levin Erp, PA  hydrocortisone  (ANUSOL-HC) 25 MG suppository Place 1 suppository (25 mg total) rectally 2 (two) times daily. Use for 7 days. 01/18/18   Levin Erp, PA  irbesartan (AVAPRO) 300 MG tablet Take 300 mg by mouth every morning.    [provider]  methimazole (TAPAZOLE) 5 MG tablet Take 5 mg by mouth 2 (two) times daily.  10/28/15   [provider]  ondansetron (ZOFRAN) 4 MG tablet Take 1 tablet (4 mg total) by mouth every 4 (four) hours as needed for nausea or vomiting. 04/15/17   Levin Erp, PA  potassium chloride SA (K-DUR,KLOR-CON) 20 MEQ tablet Take 20 mEq by mouth every morning.     [provider]  pravastatin (PRAVACHOL) 40 MG tablet Take 40 mg by mouth every morning.     [provider]  PREVIDENT 5000 PLUS 1.1 % CREA dental cream Take 1 application by mouth at bedtime. As directed 05/25/16   [provider]  triamterene-hydrochlorothiazide Jacklynn Lewis) 37.5-25 MG capsule Take one capsule by mouth daily 05/16/17   Evans Lance, MD  warfarin (COUMADIN) 2 MG tablet Take Coumadin for three weeks for postoperative protocol and then the patient may resume their previous Coumadin home regimen.  The dose may need to be adjusted based upon the INR.  Please follow the INR and titrate Coumadin dose for a therapeutic range between 2.0 and 3.0 INR.  After completing the three weeks of Coumadin, the patient may resume their previous Coumadin home regimen.   CURRENTLY HOLDING THE COUMADIN FOR THE CORTISONE INJECTION Patient taking differently: Take 4 mg by mouth on MWF and 2 mg all other days 01/10/17   Hosie Poisson, MD    Family History Family History  Problem Relation Age of Onset  . Heart disease Mother   . Leukemia Father   . Colon cancer Neg Hx   . Esophageal cancer Neg Hx   . Stomach cancer Neg Hx   . Pancreatic cancer Neg Hx   . Liver disease Neg Hx   . Colon polyps Neg Hx     Social History Social History   Tobacco Use  . Smoking status:  Former Smoker    Last attempt to quit: 12/18/1973    Years since quitting: 44.3  . Smokeless tobacco: Never Used  . Tobacco comment: Smoker for 25 years and quit at around age 66  Substance Use Topics  . Alcohol use: No  . Drug use: No     Allergies   Morphine and related   Review of Systems Review of Systems  Constitutional: Negative.   HENT: Negative.   Respiratory: Negative.   Cardiovascular: Negative.   Gastrointestinal: Negative.  Musculoskeletal: Positive for gait problem.       Walks with walker  Skin: Negative.   Neurological: Positive for weakness and headaches.  Psychiatric/Behavioral: Negative.   All other systems reviewed and are negative.    Physical Exam Updated Vital Signs BP (!) 137/55 (BP Location: Left Arm)   Pulse 60   Temp 97.8 F (36.6 C) (Rectal)   Resp 15   Ht 5\' 1"  (1.549 m)   Wt 71.2 kg   SpO2 98%   BMI 29.66 kg/m   Physical Exam Vitals signs and nursing note reviewed.  Constitutional:      Appearance: She is well-developed. She is not ill-appearing.  HENT:     Head: Normocephalic and atraumatic.     Comments: Mucous membranes dry Eyes:     Conjunctiva/sclera: Conjunctivae normal.     Pupils: Pupils are equal, round, and reactive to light.  Neck:     Musculoskeletal: Neck supple.     Thyroid: No thyromegaly.     Trachea: No tracheal deviation.  Cardiovascular:     Rate and Rhythm: Normal rate and regular rhythm.     Heart sounds: No murmur.  Pulmonary:     Effort: Pulmonary effort is normal.     Breath sounds: Normal breath sounds.  Abdominal:     General: Bowel sounds are normal. There is no distension.     Palpations: Abdomen is soft.     Tenderness: There is no abdominal tenderness.  Genitourinary:    Rectum: Normal.     Comments: Rectum normal tone brown stool no gross blood Musculoskeletal: Normal range of motion.        General: No tenderness.  Skin:    General: Skin is warm and dry.     Findings: No rash.    Neurological:     Mental Status: She is alert.     Coordination: Coordination normal.  Psychiatric:        Mood and Affect: Mood normal.      ED Treatments / Results  Labs (all labs ordered are listed, but only abnormal results are displayed) Labs Reviewed  BASIC METABOLIC PANEL - Abnormal; Notable for the following components:      Result Value   Sodium 124 (*)    Chloride 91 (*)    Glucose, Bld 129 (*)    Calcium 8.7 (*)    All other components within normal limits  CBC - Abnormal; Notable for the following components:   Hemoglobin 11.6 (*)    HCT 35.7 (*)    All other components within normal limits  CBG MONITORING, ED - Abnormal; Notable for the following components:   Glucose-Capillary 126 (*)    All other components within normal limits  URINALYSIS, ROUTINE W REFLEX MICROSCOPIC  PROTIME-INR  POC OCCULT BLOOD, ED    EKG None ED ECG REPORT   Date: 03/27/2018  Rate: 60  Rhythm: Ventricular paced rhythm  QRS Axis: left  Intervals: normal  ST/T Wave abnormalities: normal  Conduction Disutrbances:nonspecific intraventricular conduction delay  Narrative Interpretation:   Old EKG Reviewed: unchanged No change over 12/26/2017 I have personally reviewed the EKG tracing and agree with the computerized printout as noted. Radiology No results found.  Procedures Procedures (including critical care time)  Medications Ordered in ED Medications  sodium chloride 0.9 % bolus 1,000 mL (has no administration in time range)    3:50 PM feels improved after treatment with intravenous hydration with normal saline. Results for orders placed or performed during  the hospital encounter of 54/65/03  Basic metabolic panel  Result Value Ref Range   Sodium 124 (L) 135 - 145 mmol/L   Potassium 4.7 3.5 - 5.1 mmol/L   Chloride 91 (L) 98 - 111 mmol/L   CO2 24 22 - 32 mmol/L   Glucose, Bld 129 (H) 70 - 99 mg/dL   BUN 14 8 - 23 mg/dL   Creatinine, Ser 0.81 0.44 - 1.00 mg/dL    Calcium 8.7 (L) 8.9 - 10.3 mg/dL   GFR calc non Af Amer >60 >60 mL/min   GFR calc Af Amer >60 >60 mL/min   Anion gap 9 5 - 15  CBC  Result Value Ref Range   WBC 9.2 4.0 - 10.5 K/uL   RBC 3.93 3.87 - 5.11 MIL/uL   Hemoglobin 11.6 (L) 12.0 - 15.0 g/dL   HCT 35.7 (L) 36.0 - 46.0 %   MCV 90.8 80.0 - 100.0 fL   MCH 29.5 26.0 - 34.0 pg   MCHC 32.5 30.0 - 36.0 g/dL   RDW 13.9 11.5 - 15.5 %   Platelets 274 150 - 400 K/uL   nRBC 0.0 0.0 - 0.2 %  Urinalysis, Routine w reflex microscopic  Result Value Ref Range   Color, Urine STRAW (A) YELLOW   APPearance CLEAR CLEAR   Specific Gravity, Urine 1.003 (L) 1.005 - 1.030   pH 7.0 5.0 - 8.0   Glucose, UA NEGATIVE NEGATIVE mg/dL   Hgb urine dipstick NEGATIVE NEGATIVE   Bilirubin Urine NEGATIVE NEGATIVE   Ketones, ur NEGATIVE NEGATIVE mg/dL   Protein, ur NEGATIVE NEGATIVE mg/dL   Nitrite NEGATIVE NEGATIVE   Leukocytes, UA SMALL (A) NEGATIVE   RBC / HPF 0-5 0 - 5 RBC/hpf   WBC, UA 0-5 0 - 5 WBC/hpf   Bacteria, UA RARE (A) NONE SEEN   Squamous Epithelial / LPF 6-10 0 - 5  Protime-INR  Result Value Ref Range   Prothrombin Time 16.8 (H) 11.4 - 15.2 seconds   INR 1.37   CBG monitoring, ED  Result Value Ref Range   Glucose-Capillary 126 (H) 70 - 99 mg/dL  POC occult blood, ED Provider will collect  Result Value Ref Range   Fecal Occult Bld NEGATIVE NEGATIVE   Ct Head Wo Contrast  Result Date: 03/27/2018 CLINICAL DATA:  84 year old female with headache, neck pain and nausea/vomiting following fall last night. Patient on Coumadin. Initial encounter. EXAM: CT HEAD WITHOUT CONTRAST CT CERVICAL SPINE WITHOUT CONTRAST TECHNIQUE: Multidetector CT imaging of the head and cervical spine was performed following the standard protocol without intravenous contrast. Multiplanar CT image reconstructions of the cervical spine were also generated. COMPARISON:  01/07/2017 CT and prior studies FINDINGS: CT HEAD FINDINGS Brain: No evidence of acute infarction,  hemorrhage, hydrocephalus, extra-axial collection or mass lesion/mass effect. Minimal atrophy and minimal chronic small-vessel white matter ischemic changes again noted. Vascular: Carotid and vertebral atherosclerotic calcifications again noted. Skull: Normal. Negative for fracture or focal lesion. Sinuses/Orbits: No acute finding. Other: None. CT CERVICAL SPINE FINDINGS Alignment: 1.5 mm anterolisthesis of C3 on C4 and 3 mm anterolisthesis of C4 on C5 is unchanged. No new subluxation identified. Skull base and vertebrae: No acute fracture. No primary bone lesion or focal pathologic process. Soft tissues and spinal canal: No prevertebral fluid or swelling. No visible canal hematoma. Disc levels: Moderate to severe degenerative disc disease at C5-6 and C6-7 noted. Moderate multilevel LEFT facet arthropathy again noted. Upper chest: No acute abnormality Other: Enlarged thyroid gland again identified.  IMPRESSION: 1. No evidence of acute intracranial abnormality. 2. No static evidence of acute injury to the cervical spine. Multilevel degenerative changes again noted. Electronically Signed   By: Margarette Canada M.D.   On: 03/27/2018 14:25   Ct Cervical Spine Wo Contrast  Result Date: 03/27/2018 CLINICAL DATA:  84 year old female with headache, neck pain and nausea/vomiting following fall last night. Patient on Coumadin. Initial encounter. EXAM: CT HEAD WITHOUT CONTRAST CT CERVICAL SPINE WITHOUT CONTRAST TECHNIQUE: Multidetector CT imaging of the head and cervical spine was performed following the standard protocol without intravenous contrast. Multiplanar CT image reconstructions of the cervical spine were also generated. COMPARISON:  01/07/2017 CT and prior studies FINDINGS: CT HEAD FINDINGS Brain: No evidence of acute infarction, hemorrhage, hydrocephalus, extra-axial collection or mass lesion/mass effect. Minimal atrophy and minimal chronic small-vessel white matter ischemic changes again noted. Vascular: Carotid and  vertebral atherosclerotic calcifications again noted. Skull: Normal. Negative for fracture or focal lesion. Sinuses/Orbits: No acute finding. Other: None. CT CERVICAL SPINE FINDINGS Alignment: 1.5 mm anterolisthesis of C3 on C4 and 3 mm anterolisthesis of C4 on C5 is unchanged. No new subluxation identified. Skull base and vertebrae: No acute fracture. No primary bone lesion or focal pathologic process. Soft tissues and spinal canal: No prevertebral fluid or swelling. No visible canal hematoma. Disc levels: Moderate to severe degenerative disc disease at C5-6 and C6-7 noted. Moderate multilevel LEFT facet arthropathy again noted. Upper chest: No acute abnormality Other: Enlarged thyroid gland again identified. IMPRESSION: 1. No evidence of acute intracranial abnormality. 2. No static evidence of acute injury to the cervical spine. Multilevel degenerative changes again noted. Electronically Signed   By: Margarette Canada M.D.   On: 03/27/2018 14:25   Initial Impression / Assessment and Plan / ED Course  I have reviewed the triage vital signs and the nursing notes.  Pertinent labs & imaging results that were available during my care of the patient were reviewed by me and considered in my medical decision making (see chart for details).    Lab work consistent with hyponatremia and subtherapeutic INR.  And mild anemia which is stable and chronic Gust case with Dr. Dagmar Hait on-call for  Dr Osborne Casco, her PMD.  If patient is able to be discharged office will call her to recheck her INR later this week and to reexamine the patient.  [employer] representative interrogated pacemaker and pacemaker is s functioning well.  She had no heart rates over 90. 5 PM patient is alert ambulates with a walker without lightheadedness and feels ready to go home. To be felt secondary to mild dehydration. Patient encouraged to to drink.  Use walker at all times to prevent falls.  Blood pressure to be rechecked later this week   Final  Clinical Impressions(s) / ED Diagnoses  Diagnoses #1 syncope #2 chronic headache #3 hyponatremia #4 subtherapeutic INR Final diagnoses:  None   #5 elevated blood pressure ED Discharge Orders    None       Orlie Dakin, MD 03/27/18 1649    Orlie Dakin, MD 03/27/18 1704    Orlie Dakin, MD 03/27/18 1710

## 2018-03-27 NOTE — Discharge Instructions (Addendum)
Make sure that you drink at least six 8 ounce glasses of water  each day in order to stay well-hydrated.  Use your walker at all times to prevent falls.  Dr.Tisovec's office will call you tomorrow to arrange for you to be seen later this week so that your blood can be rechecked and that you can be reexamined by another provider to make sure that you doing okay.  Return if you feel worse for any reason.  Ask to have your blood pressure rechecked while in the office.  Today's was elevated at 170/65

## 2018-03-27 NOTE — ED Triage Notes (Signed)
Pt BIB EMS from home.  Pt had unwitnessed fall last night, complaints of n/v this morning.  Pt takes Coumadin.

## 2018-03-27 NOTE — ED Notes (Addendum)
Patient aware we need urine sample. Pure wick has been placed. Patient encouraged to void.

## 2018-03-27 NOTE — ED Notes (Signed)
Patient transported to CT 

## 2018-03-27 NOTE — ED Notes (Signed)
[id number] not compatible with merlyn? [facility] Rep needs to be Paged to interrogate patient.

## 2018-03-31 DIAGNOSIS — Z87898 Personal history of other specified conditions: Secondary | ICD-10-CM | POA: Diagnosis not present

## 2018-03-31 DIAGNOSIS — I4821 Permanent atrial fibrillation: Secondary | ICD-10-CM | POA: Diagnosis not present

## 2018-03-31 DIAGNOSIS — Z6827 Body mass index (BMI) 27.0-27.9, adult: Secondary | ICD-10-CM | POA: Diagnosis not present

## 2018-03-31 DIAGNOSIS — I10 Essential (primary) hypertension: Secondary | ICD-10-CM | POA: Diagnosis not present

## 2018-03-31 DIAGNOSIS — M542 Cervicalgia: Secondary | ICD-10-CM | POA: Diagnosis not present

## 2018-03-31 DIAGNOSIS — E0520 Thyrotoxicosis with toxic multinodular goiter without thyrotoxic crisis or storm: Secondary | ICD-10-CM | POA: Diagnosis not present

## 2018-03-31 DIAGNOSIS — E871 Hypo-osmolality and hyponatremia: Secondary | ICD-10-CM | POA: Diagnosis not present

## 2018-03-31 DIAGNOSIS — Z7901 Long term (current) use of anticoagulants: Secondary | ICD-10-CM | POA: Diagnosis not present

## 2018-04-12 DIAGNOSIS — Z6827 Body mass index (BMI) 27.0-27.9, adult: Secondary | ICD-10-CM | POA: Diagnosis not present

## 2018-04-12 DIAGNOSIS — Z7901 Long term (current) use of anticoagulants: Secondary | ICD-10-CM | POA: Diagnosis not present

## 2018-04-12 DIAGNOSIS — E0520 Thyrotoxicosis with toxic multinodular goiter without thyrotoxic crisis or storm: Secondary | ICD-10-CM | POA: Diagnosis not present

## 2018-04-12 DIAGNOSIS — M50121 Cervical disc disorder at C4-C5 level with radiculopathy: Secondary | ICD-10-CM | POA: Diagnosis not present

## 2018-04-12 DIAGNOSIS — Z950 Presence of cardiac pacemaker: Secondary | ICD-10-CM | POA: Diagnosis not present

## 2018-04-12 DIAGNOSIS — I6789 Other cerebrovascular disease: Secondary | ICD-10-CM | POA: Diagnosis not present

## 2018-04-12 DIAGNOSIS — E7800 Pure hypercholesterolemia, unspecified: Secondary | ICD-10-CM | POA: Diagnosis not present

## 2018-04-12 DIAGNOSIS — E871 Hypo-osmolality and hyponatremia: Secondary | ICD-10-CM | POA: Diagnosis not present

## 2018-04-12 DIAGNOSIS — E441 Mild protein-calorie malnutrition: Secondary | ICD-10-CM | POA: Diagnosis not present

## 2018-04-12 DIAGNOSIS — I4821 Permanent atrial fibrillation: Secondary | ICD-10-CM | POA: Diagnosis not present

## 2018-04-12 DIAGNOSIS — I5032 Chronic diastolic (congestive) heart failure: Secondary | ICD-10-CM | POA: Diagnosis not present

## 2018-04-12 DIAGNOSIS — I10 Essential (primary) hypertension: Secondary | ICD-10-CM | POA: Diagnosis not present

## 2018-04-25 DIAGNOSIS — I10 Essential (primary) hypertension: Secondary | ICD-10-CM | POA: Diagnosis not present

## 2018-04-25 DIAGNOSIS — K219 Gastro-esophageal reflux disease without esophagitis: Secondary | ICD-10-CM | POA: Diagnosis not present

## 2018-04-25 DIAGNOSIS — E871 Hypo-osmolality and hyponatremia: Secondary | ICD-10-CM | POA: Diagnosis not present

## 2018-04-25 DIAGNOSIS — Z6827 Body mass index (BMI) 27.0-27.9, adult: Secondary | ICD-10-CM | POA: Diagnosis not present

## 2018-04-25 DIAGNOSIS — I4821 Permanent atrial fibrillation: Secondary | ICD-10-CM | POA: Diagnosis not present

## 2018-04-25 DIAGNOSIS — Z7901 Long term (current) use of anticoagulants: Secondary | ICD-10-CM | POA: Diagnosis not present

## 2018-04-25 DIAGNOSIS — R110 Nausea: Secondary | ICD-10-CM | POA: Diagnosis not present

## 2018-05-04 ENCOUNTER — Encounter: Payer: Self-pay | Admitting: Physician Assistant

## 2018-05-04 ENCOUNTER — Encounter (INDEPENDENT_AMBULATORY_CARE_PROVIDER_SITE_OTHER): Payer: Self-pay

## 2018-05-04 ENCOUNTER — Ambulatory Visit (INDEPENDENT_AMBULATORY_CARE_PROVIDER_SITE_OTHER): Payer: Medicare Other | Admitting: Physician Assistant

## 2018-05-04 VITALS — BP 122/60 | HR 64 | Ht 62.5 in | Wt 157.0 lb

## 2018-05-04 DIAGNOSIS — K219 Gastro-esophageal reflux disease without esophagitis: Secondary | ICD-10-CM

## 2018-05-04 DIAGNOSIS — R110 Nausea: Secondary | ICD-10-CM

## 2018-05-04 DIAGNOSIS — K5900 Constipation, unspecified: Secondary | ICD-10-CM

## 2018-05-04 MED ORDER — FAMOTIDINE 20 MG PO TABS: 20.0000 mg | ORAL_TABLET | Freq: Two times a day (BID) | ORAL | 11 refills | Status: DC

## 2018-05-04 MED ORDER — OMEPRAZOLE 20 MG PO CPDR: 20.0000 mg | DELAYED_RELEASE_CAPSULE | Freq: Every day | ORAL | 11 refills | Status: DC

## 2018-05-04 NOTE — Progress Notes (Signed)
Chief Complaint: GERD, nausea, constipation  HPI:    [name] is a 85 year old Caucasian female with a past medical history as listed below including A. fib on Coumadin, known to Dr. Carlean Purl, who presents to clinic today for complaint of reflux, nausea and constipation.    02/15/2018 office visit with me to discuss rectal bleeding and nausea.  At that time, nausea was better after starting Pepcid twice daily.  She did discuss some constipation but no further rectal bleeding.  Encouraged patient to increase fiber.  Continued on Pepcid 20 mg twice daily.    Today, the patient presents to clinic accompanied by her son, who does that does assist with history.  The patient tells me that she has been using her Pepcid twice daily, once before breakfast and again before dinner but she still wakes up sometimes around 3 AM with reflux which then will make her nauseous.  At those times she takes Tums which does help.    Also complains of some nausea prior to a bowel movement if she is constipated, was recently on some pain medication which made the symptoms worse.  Started stool softeners of her own accord which has since helped, also switched to Tylenol instead of pain meds.  She does tell me though that she may have 2-3 small pieces of bowel movement in the morning and it is not quite "normal".    Denies fever, chills, weight loss, anorexia, vomiting or blood in her stool.  Past Medical History:  Diagnosis Date  . Anxiety   . Atrial fibrillation ([place])   . Bradycardia   . Cancer ([place])    skin  . Cataract    removed bilat with lens both eyes   . Complication of anesthesia   . Difficulty sleeping   . Dysrhythmia    A FIB  . GERD (gastroesophageal reflux disease)   . H/O hiatal hernia   . History of kidney stones   . History of skin cancer   . History of transfusion   . Hyperlipidemia   . Hypertension   . Osteoarthritis   . Pacemaker   . Pneumonia   . PONV (postoperative nausea and vomiting)    . Shortness of breath    WITH EXERTION    Past Surgical History:  Procedure Laterality Date  . APPENDECTOMY  1960  . ARTHROPLASTY  1994   Left total knee  . ARTHROPLASTY  2000   Total right knee  . BACK SURGERY    . CATARACT EXTRACTION, BILATERAL     with lens implants  . CHOLECYSTECTOMY    . COLONOSCOPY    . DG SELECTED HSG GDC ONLY  2002   Dilation  . Microdiskectomy  05/2004   Left L4-L5  . PACEMAKER INSERTION    . POLYPECTOMY    . TOTAL ABDOMINAL HYSTERECTOMY  1969  . TOTAL HIP ARTHROPLASTY Right 12/24/2013   Procedure: RIGHT TOTAL HIP ARTHROPLASTY ANTERIOR APPROACH;  Surgeon: Gearlean Alf, MD;  Location: [facility];  Service: Orthopedics;  Laterality: Right;  . UPPER GASTROINTESTINAL ENDOSCOPY      Current Outpatient Medications  Medication Sig Dispense Refill  . acetaminophen (TYLENOL) 325 MG tablet Take 650 mg by mouth every 6 (six) hours as needed (pain).    Marland Kitchen amLODipine (NORVASC) 10 MG tablet Take 10 mg by mouth every morning.     . clonazePAM (KLONOPIN) 0.5 MG tablet Take 0.5 mg by mouth daily as needed for anxiety.     . enoxaparin (  LOVENOX) 80 MG/0.8ML injection Inject 70 mg into the skin 2 (two) times daily.  2  . famotidine (PEPCID) 20 MG tablet Take 1 tablet (20 mg total) by mouth 2 (two) times daily. Take every AM and at bedtime 60 tablet 11  . glycerin adult 2 g suppository Place 1 suppository rectally as needed for constipation.    . hydrocortisone (ANUSOL-HC) 25 MG suppository Place 1 suppository (25 mg total) rectally 2 (two) times daily. Use for 7 days. 14 suppository 1  . irbesartan (AVAPRO) 300 MG tablet Take 300 mg by mouth every morning.    . methimazole (TAPAZOLE) 5 MG tablet Take 5 mg by mouth 2 (two) times daily.   0  . ondansetron (ZOFRAN) 4 MG tablet Take 1 tablet (4 mg total) by mouth every 4 (four) hours as needed for nausea or vomiting. 20 tablet 1  . pravastatin (PRAVACHOL) 40 MG tablet Take 40 mg by mouth every morning.     . traMADol  (ULTRAM) 50 MG tablet Take 25-50 mg by mouth every 8 (eight) hours as needed for moderate pain.     Marland Kitchen triamterene-hydrochlorothiazide (DYAZIDE) 37.5-25 MG capsule Take one capsule by mouth daily 90 capsule 2  . warfarin (COUMADIN) 2 MG tablet Take Coumadin for three weeks for postoperative protocol and then the patient may resume their previous Coumadin home regimen.  The dose may need to be adjusted based upon the INR.  Please follow the INR and titrate Coumadin dose for a therapeutic range between 2.0 and 3.0 INR.  After completing the three weeks of Coumadin, the patient may resume their previous Coumadin home regimen.   CURRENTLY HOLDING THE COUMADIN FOR THE CORTISONE INJECTION (Patient taking differently: Take 4 mg by mouth on MWF and 2 mg all other days) 30 tablet 0  . omeprazole (PRILOSEC) 20 MG capsule Take 1 capsule (20 mg total) by mouth daily. Take 30-60 min before dinner 30 capsule 11   No current facility-administered medications for this visit.     Allergies as of 05/04/2018 - Review Complete 05/04/2018  Allergen Reaction Noted  . Morphine and related Nausea And Vomiting 01/08/2012    Family History  Problem Relation Age of Onset  . Heart disease Mother   . Leukemia Father   . Colon cancer Neg Hx   . Esophageal cancer Neg Hx   . Stomach cancer Neg Hx   . Pancreatic cancer Neg Hx   . Liver disease Neg Hx   . Colon polyps Neg Hx     Social History   Socioeconomic History  . Marital status: Widowed    Spouse name: Not on file  . Number of children: 2  . Years of education: Not on file  . Highest education level: Not on file  Occupational History  . Occupation: retired  Scientific laboratory technician  . Financial resource strain: Not on file  . Food insecurity:    Worry: Not on file    Inability: Not on file  . Transportation needs:    Medical: Not on file    Non-medical: Not on file  Tobacco Use  . Smoking status: Former Smoker    Last attempt to quit: 12/18/1973    Years  since quitting: 44.4  . Smokeless tobacco: Never Used  . Tobacco comment: Smoker for 25 years and quit at around age 60  Substance and Sexual Activity  . Alcohol use: No  . Drug use: No  . Sexual activity: Not Currently  Lifestyle  . Physical  activity:    Days per week: Not on file    Minutes per session: Not on file  . Stress: Not on file  Relationships  . Social connections:    Talks on phone: Not on file    Gets together: Not on file    Attends religious service: Not on file    Active member of club or organization: Not on file    Attends meetings of clubs or organizations: Not on file    Relationship status: Not on file  . Intimate partner violence:    Fear of current or ex partner: Not on file    Emotionally abused: Not on file    Physically abused: Not on file    Forced sexual activity: Not on file  Other Topics Concern  . Not on file  Social History Narrative   Married, husband's name is Warner Mccreedy, they have been married since 1950   2 sons and 4 grandchildren          Review of Systems:    Constitutional: No weight loss, fever or chills Cardiovascular: No chest pain Respiratory: No SOB  Gastrointestinal: See HPI and otherwise negative   Physical Exam:  Vital signs: BP 122/60   Pulse 64   Ht 5' 2.5" (1.588 m)   Wt 157 lb (71.2 kg)   BMI 28.26 kg/m   Constitutional:   Pleasant Elderly Caucasian female appears to be in NAD, Well developed, Well nourished, alert and cooperative Respiratory: Respirations even and unlabored. Lungs clear to auscultation bilaterally.   No wheezes, crackles, or rhonchi.  Cardiovascular: Normal S1, S2. No MRG. Regular rate and rhythm. No peripheral edema, cyanosis or pallor.  Gastrointestinal:  Soft, nondistended, nontender. No rebound or guarding. Normal bowel sounds. No appreciable masses or hepatomegaly. Rectal:  Not performed.  Msk:  Symmetrical without gross deformities. Without edema, no deformity or joint abnormality. +ambulates  with cane Psychiatric: Demonstrates good judgement and reason without abnormal affect or behaviors.  MOST RECENT LABS AND IMAGING: CBC    Component Value Date/Time   WBC 9.2 03/27/2018 1155   RBC 3.93 03/27/2018 1155   HGB 11.6 (L) 03/27/2018 1155   HCT 35.7 (L) 03/27/2018 1155   PLT 274 03/27/2018 1155   MCV 90.8 03/27/2018 1155   MCH 29.5 03/27/2018 1155   MCHC 32.5 03/27/2018 1155   RDW 13.9 03/27/2018 1155   LYMPHSABS 1.7 01/10/2017 1147   MONOABS 1.1 (H) 01/10/2017 1147   EOSABS 0.2 01/10/2017 1147   BASOSABS 0.0 01/10/2017 1147    CMP     Component Value Date/Time   NA 124 (L) 03/27/2018 1155   K 4.7 03/27/2018 1155   CL 91 (L) 03/27/2018 1155   CO2 24 03/27/2018 1155   GLUCOSE 129 (H) 03/27/2018 1155   BUN 14 03/27/2018 1155   CREATININE 0.81 03/27/2018 1155   CALCIUM 8.7 (L) 03/27/2018 1155   PROT 7.1 01/04/2017 0626   ALBUMIN 4.0 01/04/2017 0626   AST 21 01/04/2017 0626   ALT 14 01/04/2017 0626   ALKPHOS 94 01/04/2017 0626   BILITOT 0.8 01/04/2017 0626   GFRNONAA >60 03/27/2018 1155   GFRAA >60 03/27/2018 1155    Assessment: 1.  GERD: Moderately controlled with Pepcid 20 mg twice daily, some breakthrough in the early morning/at night 2.  Nausea: With above and when constipated 3.  Constipation: Better now patient is off of pain medication  Plan: 1.  Again discussed increasing fiber in her diet with a fiber supplement. 2.  Would discontinue stool softener and start MiraLAX once daily. 3.  Prescribed Omeprazole 20 mg 20-30 minutes before dinner, #30 with 5 refills 4.  Patient to continue Pepcid twice daily, every morning and nightly.  Discussed specific timing of these medications with the patient today. 5.  Patient to follow in clinic with me or Dr. Carlean Purl as needed in the future.  Ellouise Newer, PA-C [facility] 05/04/2018, 11:11 AM  Cc: [name], MD

## 2018-05-04 NOTE — Patient Instructions (Signed)
Normal BMI (Body Mass Index- based on height and weight) is between 23 and 30. Your BMI today is Body mass index is 28.26 kg/m. Marland Kitchen Please consider follow up  regarding your BMI with your Primary Care Provider.  We have sent the following medications to your pharmacy for you to pick up at your convenience: Omeprazole  Start OTC Miralax daily in the morning  Stop stool softener  Thank you for entrusting me with your care and choosing [facility].  Ellouise Newer PA

## 2018-05-16 DIAGNOSIS — K219 Gastro-esophageal reflux disease without esophagitis: Secondary | ICD-10-CM | POA: Diagnosis not present

## 2018-05-16 DIAGNOSIS — K5900 Constipation, unspecified: Secondary | ICD-10-CM | POA: Diagnosis not present

## 2018-05-16 DIAGNOSIS — E871 Hypo-osmolality and hyponatremia: Secondary | ICD-10-CM | POA: Diagnosis not present

## 2018-05-16 DIAGNOSIS — Z7901 Long term (current) use of anticoagulants: Secondary | ICD-10-CM | POA: Diagnosis not present

## 2018-05-16 DIAGNOSIS — M50121 Cervical disc disorder at C4-C5 level with radiculopathy: Secondary | ICD-10-CM | POA: Diagnosis not present

## 2018-05-16 DIAGNOSIS — I4821 Permanent atrial fibrillation: Secondary | ICD-10-CM | POA: Diagnosis not present

## 2018-05-30 ENCOUNTER — Encounter: Payer: Self-pay | Admitting: Internal Medicine

## 2018-06-03 ENCOUNTER — Other Ambulatory Visit: Payer: Self-pay | Admitting: Internal Medicine

## 2018-06-12 ENCOUNTER — Telehealth: Payer: Self-pay

## 2018-06-12 NOTE — Telephone Encounter (Signed)
Call placed to Pt.  Advised at this time-routine follow up appointments are being cancelled to reduce possible exposure for our patients.  Per Pt-feels fine.  Pt had already decided to self-quarantine.  Advised appointment would be cancelled and Pt would be contacted to reschedule.  Advised to call office if any needs.

## 2018-06-14 ENCOUNTER — Encounter: Payer: Medicare Other | Admitting: Internal Medicine

## 2018-06-20 DIAGNOSIS — I4821 Permanent atrial fibrillation: Secondary | ICD-10-CM | POA: Diagnosis not present

## 2018-06-20 DIAGNOSIS — R110 Nausea: Secondary | ICD-10-CM | POA: Diagnosis not present

## 2018-06-20 DIAGNOSIS — K219 Gastro-esophageal reflux disease without esophagitis: Secondary | ICD-10-CM | POA: Diagnosis not present

## 2018-06-20 DIAGNOSIS — Z7901 Long term (current) use of anticoagulants: Secondary | ICD-10-CM | POA: Diagnosis not present

## 2018-06-20 DIAGNOSIS — E871 Hypo-osmolality and hyponatremia: Secondary | ICD-10-CM | POA: Diagnosis not present

## 2018-06-29 ENCOUNTER — Encounter: Payer: Self-pay | Admitting: Internal Medicine

## 2018-06-29 DIAGNOSIS — I495 Sick sinus syndrome: Secondary | ICD-10-CM | POA: Diagnosis not present

## 2018-06-29 DIAGNOSIS — R001 Bradycardia, unspecified: Secondary | ICD-10-CM | POA: Diagnosis not present

## 2018-07-18 ENCOUNTER — Emergency Department (HOSPITAL_COMMUNITY): Payer: Medicare Other

## 2018-07-18 ENCOUNTER — Other Ambulatory Visit: Payer: Self-pay

## 2018-07-18 ENCOUNTER — Telehealth: Payer: Self-pay | Admitting: Internal Medicine

## 2018-07-18 ENCOUNTER — Observation Stay (HOSPITAL_COMMUNITY)
Admission: EM | Admit: 2018-07-18 | Discharge: 2018-07-19 | Disposition: A | Discharge status: Home / Self Care | Payer: Medicare Other | Attending: Internal Medicine | Admitting: Internal Medicine

## 2018-07-18 DIAGNOSIS — R0609 Other forms of dyspnea: Secondary | ICD-10-CM | POA: Diagnosis not present

## 2018-07-18 DIAGNOSIS — R110 Nausea: Secondary | ICD-10-CM | POA: Diagnosis not present

## 2018-07-18 DIAGNOSIS — G4700 Insomnia, unspecified: Secondary | ICD-10-CM

## 2018-07-18 DIAGNOSIS — R079 Chest pain, unspecified: Secondary | ICD-10-CM

## 2018-07-18 DIAGNOSIS — Z7901 Long term (current) use of anticoagulants: Secondary | ICD-10-CM | POA: Diagnosis not present

## 2018-07-18 DIAGNOSIS — R0789 Other chest pain: Principal | ICD-10-CM | POA: Insufficient documentation

## 2018-07-18 DIAGNOSIS — M1990 Unspecified osteoarthritis, unspecified site: Secondary | ICD-10-CM | POA: Diagnosis not present

## 2018-07-18 DIAGNOSIS — K219 Gastro-esophageal reflux disease without esophagitis: Secondary | ICD-10-CM | POA: Diagnosis not present

## 2018-07-18 DIAGNOSIS — I10 Essential (primary) hypertension: Secondary | ICD-10-CM | POA: Diagnosis not present

## 2018-07-18 DIAGNOSIS — F419 Anxiety disorder, unspecified: Secondary | ICD-10-CM | POA: Diagnosis not present

## 2018-07-18 DIAGNOSIS — N183 Chronic kidney disease, stage 3 (moderate): Secondary | ICD-10-CM | POA: Diagnosis not present

## 2018-07-18 DIAGNOSIS — Z87891 Personal history of nicotine dependence: Secondary | ICD-10-CM | POA: Diagnosis not present

## 2018-07-18 DIAGNOSIS — I4891 Unspecified atrial fibrillation: Secondary | ICD-10-CM | POA: Diagnosis not present

## 2018-07-18 DIAGNOSIS — E871 Hypo-osmolality and hyponatremia: Secondary | ICD-10-CM | POA: Diagnosis not present

## 2018-07-18 DIAGNOSIS — R001 Bradycardia, unspecified: Secondary | ICD-10-CM | POA: Diagnosis present

## 2018-07-18 DIAGNOSIS — I130 Hypertensive heart and chronic kidney disease with heart failure and stage 1 through stage 4 chronic kidney disease, or unspecified chronic kidney disease: Secondary | ICD-10-CM | POA: Diagnosis not present

## 2018-07-18 DIAGNOSIS — I4821 Permanent atrial fibrillation: Secondary | ICD-10-CM | POA: Diagnosis not present

## 2018-07-18 DIAGNOSIS — Z950 Presence of cardiac pacemaker: Secondary | ICD-10-CM | POA: Diagnosis not present

## 2018-07-18 DIAGNOSIS — Z79899 Other long term (current) drug therapy: Secondary | ICD-10-CM | POA: Diagnosis not present

## 2018-07-18 DIAGNOSIS — R0602 Shortness of breath: Secondary | ICD-10-CM | POA: Diagnosis not present

## 2018-07-18 DIAGNOSIS — M50121 Cervical disc disorder at C4-C5 level with radiculopathy: Secondary | ICD-10-CM | POA: Diagnosis not present

## 2018-07-18 DIAGNOSIS — I5032 Chronic diastolic (congestive) heart failure: Secondary | ICD-10-CM | POA: Diagnosis not present

## 2018-07-18 LAB — BASIC METABOLIC PANEL
Anion gap: 11 (ref 5–15)
BUN: 13 mg/dL (ref 8–23)
CO2: 24 mmol/L (ref 22–32)
Calcium: 9.4 mg/dL (ref 8.9–10.3)
Chloride: 97 mmol/L — ABNORMAL LOW (ref 98–111)
Creatinine, Ser: 0.85 mg/dL (ref 0.44–1.00)
GFR calc Af Amer: >60 mL/min (ref >60)
GFR calc non Af Amer: >60 mL/min (ref >60)
Glucose, Bld: 98 mg/dL (ref 70–99)
Potassium: 3.9 mmol/L (ref 3.5–5.1)
Sodium: 132 mmol/L — ABNORMAL LOW (ref 135–145)

## 2018-07-18 LAB — CBC WITH DIFFERENTIAL/PLATELET
Abs Immature Granulocytes: 0.02 10*3/uL (ref 0.00–0.07)
Basophils Absolute: 0 10*3/uL (ref 0.0–0.1)
Basophils Relative: 1 %
Eosinophils Absolute: 0.2 10*3/uL (ref 0.0–0.5)
Eosinophils Relative: 2 %
HCT: 34.6 % — ABNORMAL LOW (ref 36.0–46.0)
Hemoglobin: 11 g/dL — ABNORMAL LOW (ref 12.0–15.0)
Immature Granulocytes: 0 %
Lymphocytes Relative: 13 %
Lymphs Abs: 0.9 10*3/uL (ref 0.7–4.0)
MCH: 27.6 pg (ref 26.0–34.0)
MCHC: 31.8 g/dL (ref 30.0–36.0)
MCV: 86.9 fL (ref 80.0–100.0)
Monocytes Absolute: 0.5 10*3/uL (ref 0.1–1.0)
Monocytes Relative: 8 %
Neutro Abs: 5.4 10*3/uL (ref 1.7–7.7)
Neutrophils Relative %: 76 %
Platelets: 309 10*3/uL (ref 150–400)
RBC: 3.98 MIL/uL (ref 3.87–5.11)
RDW: 14.2 % (ref 11.5–15.5)
WBC: 7.1 10*3/uL (ref 4.0–10.5)
nRBC: 0 % (ref 0.0–0.2)

## 2018-07-18 LAB — PROTIME-INR
INR: 2.2 — ABNORMAL HIGH (ref 0.8–1.2)
Prothrombin Time: 24.2 seconds — ABNORMAL HIGH (ref 11.4–15.2)

## 2018-07-18 LAB — TROPONIN I
Troponin I: <0.03 ng/mL (ref <0.03)
Troponin I: <0.03 ng/mL (ref <0.03)

## 2018-07-18 MED ORDER — AMLODIPINE BESYLATE 10 MG PO TABS
10.0000 mg | ORAL_TABLET | Freq: Every morning | ORAL | Status: DC
  Administered 2018-07-19: 10 mg via ORAL
  Filled 2018-07-18: qty 1

## 2018-07-18 MED ORDER — PANTOPRAZOLE SODIUM 40 MG PO TBEC: 40.0000 mg | DELAYED_RELEASE_TABLET | Freq: Every day | ORAL | Status: DC

## 2018-07-18 MED ORDER — NITROGLYCERIN 0.4 MG SL SUBL: 0.4000 mg | SUBLINGUAL_TABLET | SUBLINGUAL | Status: DC | PRN

## 2018-07-18 MED ORDER — IRBESARTAN 150 MG PO TABS
300.0000 mg | ORAL_TABLET | Freq: Every morning | ORAL | Status: DC
  Administered 2018-07-19: 11:00:00 300 mg via ORAL
  Filled 2018-07-18: qty 2

## 2018-07-18 MED ORDER — WARFARIN SODIUM 2 MG PO TABS: 2.0000 mg | ORAL_TABLET | ORAL | Status: DC

## 2018-07-18 MED ORDER — ONDANSETRON HCL 4 MG/2ML IJ SOLN: 4.0000 mg | Freq: Four times a day (QID) | INTRAMUSCULAR | Status: DC | PRN

## 2018-07-18 MED ORDER — ACETAMINOPHEN 325 MG PO TABS: 650.0000 mg | ORAL_TABLET | ORAL | Status: DC | PRN

## 2018-07-18 MED ORDER — TRIAMTERENE-HCTZ 37.5-25 MG PO TABS
1.0000 | ORAL_TABLET | Freq: Every day | ORAL | Status: DC
  Filled 2018-07-18: qty 1

## 2018-07-18 MED ORDER — ASPIRIN 81 MG PO CHEW
324.0000 mg | CHEWABLE_TABLET | Freq: Once | ORAL | Status: AC
  Administered 2018-07-18: 324 mg via ORAL
  Filled 2018-07-18: qty 4

## 2018-07-18 MED ORDER — PRAVASTATIN SODIUM 40 MG PO TABS
40.0000 mg | ORAL_TABLET | Freq: Every morning | ORAL | Status: DC
  Administered 2018-07-19: 40 mg via ORAL
  Filled 2018-07-18: qty 1

## 2018-07-18 MED ORDER — CLONAZEPAM 0.5 MG PO TABS
0.5000 mg | ORAL_TABLET | Freq: Every evening | ORAL | Status: DC | PRN
  Administered 2018-07-19: 0.5 mg via ORAL
  Filled 2018-07-18: qty 1

## 2018-07-18 MED ORDER — METHIMAZOLE 5 MG PO TABS
5.0000 mg | ORAL_TABLET | Freq: Two times a day (BID) | ORAL | Status: DC
  Administered 2018-07-18: 5 mg via ORAL
  Filled 2018-07-18 (×2): qty 1

## 2018-07-18 MED ORDER — FAMOTIDINE 20 MG PO TABS
20.0000 mg | ORAL_TABLET | Freq: Two times a day (BID) | ORAL | Status: DC
  Administered 2018-07-18 – 2018-07-19 (×2): 20 mg via ORAL
  Filled 2018-07-18 (×2): qty 1

## 2018-07-18 MED ORDER — HYDRALAZINE HCL 20 MG/ML IJ SOLN: 10.0000 mg | Freq: Four times a day (QID) | INTRAMUSCULAR | Status: DC | PRN

## 2018-07-18 NOTE — Progress Notes (Signed)
ANTICOAGULATION CONSULT NOTE - Initial Consult  Pharmacy Consult for warfarin Indication: atrial fibrillation  Allergies  Allergen Reactions  . Morphine And Related Nausea And Vomiting    Patient Measurements: Height: 5' 2.5" (158.8 cm) Weight: 153 lb (69.4 kg) IBW/kg (Calculated) : 51.25  Vital Signs: Temp: 98.1 F (36.7 C) (04/21 1305) Temp Source: Oral (04/21 1305) BP: 181/76 (04/21 1630) Pulse Rate: 63 (04/21 1630)  Labs: Recent Labs    07/18/18 1407  HGB 11.0*  HCT 34.6*  PLT 309  LABPROT 24.2*  INR 2.2*  CREATININE 0.85  TROPONINI <0.03    Estimated Creatinine Clearance: 40.6 mL/min (by C-G formula based on SCr of 0.85 mg/dL).   Medical History: Past Medical History:  Diagnosis Date  . Anxiety   . Atrial fibrillation ([place])   . Bradycardia   . Cancer ([place])    skin  . Cataract    removed bilat with lens both eyes   . Complication of anesthesia   . Difficulty sleeping   . Dysrhythmia    A FIB  . GERD (gastroesophageal reflux disease)   . H/O hiatal hernia   . History of kidney stones   . History of skin cancer   . History of transfusion   . Hyperlipidemia   . Hypertension   . Osteoarthritis   . Pacemaker   . Pneumonia   . PONV (postoperative nausea and vomiting)   . Shortness of breath    WITH EXERTION    Assessment: 89 yoF on warfarin at home for PAF admitted with intermittent CP. INR 2.2 on admit, pt reports taking dose already today.  *PTA Warfarin dose = 4mg  MWF, 2mg  all other days  Goal of Therapy:  INR 2-3 Monitor platelets by anticoagulation protocol: Yes   Plan:  -No warfarin tonight -INR tomorrow  [name], PharmD, BCPS Clinical Pharmacist [phone] Please check AMION for all [facility] numbers 07/18/2018

## 2018-07-18 NOTE — ED Triage Notes (Signed)
Pt BIB [facility], CP and SOB with excertion past few days. Was seen by MD, recommended to have a medic come evaluate. No recent travel or COVID contact.

## 2018-07-18 NOTE — Telephone Encounter (Signed)
Pt called to report that she has been having a lot of anxiety since being worried about COVID and not being able to leave her house.... she has been having increased sob with exertion over the past week.. she says her ankle edema is about the same as it always is and gets better when she lays down at night.  She had to take an anxiety pill last night because she could not sleep. This morning she has been having a constant dull ache in her left shoulder radiating down her left arm some and into her left breast area. She denies dizziness.   Her last Stress test was 2017... I asked if she would agree that calling EMS may be a good idea since she says that she just is not feeling well and the discomfort is getting worse according to her... she agrees and will have her sons come over that live next door and behind her..  She will call back if she decides to not go to the ER but will at least have them come and assess her and possibly check her EKG. I urged her to consider going if they recommend she should and she agreed.   Will forward to Dr. Lovena Le for his review.

## 2018-07-18 NOTE — ED Notes (Signed)
ED TO INPATIENT HANDOFF REPORT  ED Nurse Name and Phone #: Barbie Banner, RN -[phone]  S Name/Age/Gender [name] 85 y.o. female Room/Bed: 039C/039C  Code Status   Code Status: Prior  Home/SNF/Other Home Patient oriented to: self, place, time and situation Is this baseline? Yes   Triage Complete: Triage complete  Chief Complaint cp  Triage Note Pt BIB [facility], CP and SOB with excertion past few days. Was seen by MD, recommended to have a medic come evaluate. No recent travel or COVID contact.    Allergies Allergies  Allergen Reactions  . Morphine And Related Nausea And Vomiting    Level of Care/Admitting Diagnosis ED Disposition    ED Disposition Condition [facility] Area: [place] [[id number]]  Level of Care: Telemetry Cardiac [103]  Covid Evaluation: N/A  Diagnosis: Chest pain [295188]  Admitting Physician: Anda Latina [[id number]]  Attending Physician: Anda Latina [phone]  PT Class (Do Not Modify): Observation [104]  PT Acc Code (Do Not Modify): Observation [10022]       B Medical/Surgery History Past Medical History:  Diagnosis Date  . Anxiety   . Atrial fibrillation ([place])   . Bradycardia   . Cancer ([place])    skin  . Cataract    removed bilat with lens both eyes   . Complication of anesthesia   . Difficulty sleeping   . Dysrhythmia    A FIB  . GERD (gastroesophageal reflux disease)   . H/O hiatal hernia   . History of kidney stones   . History of skin cancer   . History of transfusion   . Hyperlipidemia   . Hypertension   . Osteoarthritis   . Pacemaker   . Pneumonia   . PONV (postoperative nausea and vomiting)   . Shortness of breath    WITH EXERTION   Past Surgical History:  Procedure Laterality Date  . APPENDECTOMY  1960  . ARTHROPLASTY  1994   Left total knee  . ARTHROPLASTY  2000   Total right knee  . BACK SURGERY    . CATARACT EXTRACTION, BILATERAL     with lens implants  . CHOLECYSTECTOMY     . COLONOSCOPY    . DG SELECTED HSG GDC ONLY  2002   Dilation  . Microdiskectomy  05/2004   Left L4-L5  . PACEMAKER INSERTION    . POLYPECTOMY    . TOTAL ABDOMINAL HYSTERECTOMY  1969  . TOTAL HIP ARTHROPLASTY Right 12/24/2013   Procedure: RIGHT TOTAL HIP ARTHROPLASTY ANTERIOR APPROACH;  Surgeon: Gearlean Alf, MD;  Location: [facility];  Service: Orthopedics;  Laterality: Right;  . UPPER GASTROINTESTINAL ENDOSCOPY       A IV Location/Drains/Wounds Patient Lines/Drains/Airways Status   Active Line/Drains/Airways    Name:   Placement date:   Placement time:   Site:   Days:   Peripheral IV 07/18/18 Left Hand   07/18/18    1331    Hand   less than 1          Intake/Output Last 24 hours No intake or output data in the 24 hours ending 07/18/18 1620  Labs/Imaging Results for orders placed or performed during the hospital encounter of 07/18/18 (from the past 48 hour(s))  Basic metabolic panel     Status: Abnormal   Collection Time: 07/18/18  2:07 PM  Result Value Ref Range   Sodium 132 (L) 135 - 145 mmol/L   Potassium 3.9 3.5 - 5.1 mmol/L  Chloride 97 (L) 98 - 111 mmol/L   CO2 24 22 - 32 mmol/L   Glucose, Bld 98 70 - 99 mg/dL   BUN 13 8 - 23 mg/dL   Creatinine, Ser 0.85 0.44 - 1.00 mg/dL   Calcium 9.4 8.9 - 10.3 mg/dL   GFR calc non Af Amer >60 >60 mL/min   GFR calc Af Amer >60 >60 mL/min   Anion gap 11 5 - 15    Comment: Performed at [facility] [address] [place], [place] [address]  Troponin I - Once     Status: None   Collection Time: 07/18/18  2:07 PM  Result Value Ref Range   Troponin I <0.03 <0.03 ng/mL    Comment: Performed at [facility] [address] [place], [place] [address]  CBC with Differential     Status: Abnormal   Collection Time: 07/18/18  2:07 PM  Result Value Ref Range   WBC 7.1 4.0 - 10.5 K/uL   RBC 3.98 3.87 - 5.11 MIL/uL   Hemoglobin 11.0 (L) 12.0 - 15.0 g/dL   HCT 34.6 (L) 36.0 - 46.0 %   MCV 86.9 80.0 - 100.0 fL   MCH  27.6 26.0 - 34.0 pg   MCHC 31.8 30.0 - 36.0 g/dL   RDW 14.2 11.5 - 15.5 %   Platelets 309 150 - 400 K/uL   nRBC 0.0 0.0 - 0.2 %   Neutrophils Relative % 76 %   Neutro Abs 5.4 1.7 - 7.7 K/uL   Lymphocytes Relative 13 %   Lymphs Abs 0.9 0.7 - 4.0 K/uL   Monocytes Relative 8 %   Monocytes Absolute 0.5 0.1 - 1.0 K/uL   Eosinophils Relative 2 %   Eosinophils Absolute 0.2 0.0 - 0.5 K/uL   Basophils Relative 1 %   Basophils Absolute 0.0 0.0 - 0.1 K/uL   Immature Granulocytes 0 %   Abs Immature Granulocytes 0.02 0.00 - 0.07 K/uL    Comment: Performed at [facility] [address] [place], [place] [address]  Protime-INR     Status: Abnormal   Collection Time: 07/18/18  2:07 PM  Result Value Ref Range   Prothrombin Time 24.2 (H) 11.4 - 15.2 seconds   INR 2.2 (H) 0.8 - 1.2    Comment: (NOTE) INR goal varies based on device and disease states. Performed at [facility], [place] [address], [place], [place] [address]    Dg Chest 2 View  Result Date: 07/18/2018 CLINICAL DATA:  Chest pressure and discomfort. History of atrial fibrillation EXAM: CHEST - 2 VIEW COMPARISON:  Chest radiograph June 11, 2016; chest CT June 12, 2016 FINDINGS: There is scarring in the left base. The lungs elsewhere are clear. Heart is upper normal in size with pulmonary vascularity normal. Pacemaker lead is attached to the right ventricle. No adenopathy. There is aortic atherosclerosis. There is degenerative change in each shoulder. IMPRESSION: Scarring left base. No edema or consolidation. Stable cardiac silhouette. Pacemaker lead attached to right ventricle. Aortic Atherosclerosis (ICD10-I70.0). Electronically Signed   By: Lowella Kim III M.D.   On: 07/18/2018 13:50    Pending Labs Unresulted Labs (From admission, onward)    Start     Ordered   07/18/18 2100  Troponin I - Once-Timed  Once-Timed,   R     07/18/18 1551   07/18/18 1700  Troponin I - Once-Timed  Once-Timed,   R     07/18/18 1551  Vitals/Pain Today's Vitals   07/18/18 1530 07/18/18 1545 07/18/18 1600 07/18/18 1615  BP: (!) 160/69 (!) 154/68 (!) 149/71 (!) 142/75  Pulse: (!) 59 (!) 58 60 (!) 59  Resp:      Temp:      TempSrc:      SpO2: 100% 100% 99% 99%  Weight:      Height:        Isolation Precautions No active isolations  Medications Medications  aspirin chewable tablet 324 mg (has no administration in time range)  nitroGLYCERIN (NITROSTAT) SL tablet 0.4 mg (has no administration in time range)    Mobility walks with person assist     Focused Assessments Cardiac Assessment Handoff:  Cardiac Rhythm: Normal sinus rhythm Lab Results  Component Value Date   TROPONINI <0.03 07/18/2018   No results found for: DDIMER Does the Patient currently have chest pain? No     R Recommendations: See Admitting Provider Note  Report given to: 6E  Additional Notes:

## 2018-07-18 NOTE — ED Notes (Signed)
Pt placed on bedpan

## 2018-07-18 NOTE — ED Notes (Signed)
Pt's son shameeka silliman Ph# [phone]. Pls contact for status update and D/c

## 2018-07-18 NOTE — Telephone Encounter (Signed)
Pt c/o Shortness Of Breath: STAT if SOB developed within the last 24 hours or pt is noticeably SOB on the phone  1. Are you currently SOB (can you hear that pt is SOB on the phone)? no  2. How long have you been experiencing SOB? About one month, has been worse the past few days  3. Are you SOB when sitting or when up moving around? both  4. Are you currently experiencing any other symptoms? Left arm and shoulder pain. She states she has been having some anxiety with all this stuff of the virus going around.

## 2018-07-18 NOTE — ED Provider Notes (Signed)
[facility] EMERGENCY DEPARTMENT Provider Note   CSN: [id number] Arrival date & time: 07/18/18  1253    History   Chief Complaint No chief complaint on file.   HPI [name] is a 85 y.o. female.     HPI  85 year old female presents with chest pain.  Overall she states she is been having atraumatic shoulder pain for about 1 week.  Comes and goes.  No trauma or pain with range of motion.  Last night she developed some chest pressure that she rated as about a 5 out of 10.  She has chronic dyspnea and chronic dyspnea on exertion that is on changed.  Today the chest pain is not as bad as about a 3 out of 10.  It seems to come and go but nothing specific causes such as exertion.  She is not tried any medicines.  She has bilateral leg/ankle swelling that is chronic and baseline.  Past Medical History:  Diagnosis Date  . Anxiety   . Atrial fibrillation ([place])   . Bradycardia   . Cancer ([place])    skin  . Cataract    removed bilat with lens both eyes   . Complication of anesthesia   . Difficulty sleeping   . Dysrhythmia    A FIB  . GERD (gastroesophageal reflux disease)   . H/O hiatal hernia   . History of kidney stones   . History of skin cancer   . History of transfusion   . Hyperlipidemia   . Hypertension   . Osteoarthritis   . Pacemaker   . Pneumonia   . PONV (postoperative nausea and vomiting)   . Shortness of breath    WITH EXERTION    Patient Active Problem List   Diagnosis Date Noted  . Complication of anesthesia   . Intractable headache 01/06/2017  . Intractable pain 01/04/2017  . Chronic anticoagulation 01/03/2017  . Neck pain 01/03/2017  . Acute bronchitis 06/08/2016  . Fall 09/24/2015  . Dizziness 09/24/2015  . Postoperative anemia due to acute blood loss 12/27/2013  . Postop Transfusion 12/27/2013  . OA (osteoarthritis) of hip 12/24/2013  . Preop cardiovascular exam 09/25/2013  . Cardiac pacemaker in situ 08/29/2008  .  Essential hypertension 08/21/2008  . ATRIAL FIBRILLATION 08/21/2008  . BRADYCARDIA 08/21/2008  . PNEUMONIA 08/21/2008  . GERD 08/21/2008  . NEPHROLITHIASIS 08/21/2008  . OSTEOARTHRITIS 08/21/2008    Past Surgical History:  Procedure Laterality Date  . APPENDECTOMY  1960  . ARTHROPLASTY  1994   Left total knee  . ARTHROPLASTY  2000   Total right knee  . BACK SURGERY    . CATARACT EXTRACTION, BILATERAL     with lens implants  . CHOLECYSTECTOMY    . COLONOSCOPY    . DG SELECTED HSG GDC ONLY  2002   Dilation  . Microdiskectomy  05/2004   Left L4-L5  . PACEMAKER INSERTION    . POLYPECTOMY    . TOTAL ABDOMINAL HYSTERECTOMY  1969  . TOTAL HIP ARTHROPLASTY Right 12/24/2013   Procedure: RIGHT TOTAL HIP ARTHROPLASTY ANTERIOR APPROACH;  Surgeon: Gearlean Alf, MD;  Location: [facility];  Service: Orthopedics;  Laterality: Right;  . UPPER GASTROINTESTINAL ENDOSCOPY       OB History   No obstetric history on file.      Home Medications    Prior to Admission medications   Medication Sig Start Date End Date Taking? Authorizing Provider  acetaminophen (TYLENOL) 325 MG tablet Take 650 mg  by mouth every 6 (six) hours as needed (pain).    [provider]  amLODipine (NORVASC) 10 MG tablet Take 10 mg by mouth every morning.     [provider]  clonazePAM (KLONOPIN) 0.5 MG tablet Take 0.5 mg by mouth daily as needed for anxiety.  03/18/15   [provider]  enoxaparin (LOVENOX) 80 MG/0.8ML injection Inject 70 mg into the skin 2 (two) times daily. 02/02/18   [provider]  famotidine (PEPCID) 20 MG tablet Take 1 tablet (20 mg total) by mouth 2 (two) times daily. Take every AM and at bedtime 05/04/18   Levin Erp, PA  glycerin adult 2 g suppository Place 1 suppository rectally as needed for constipation.    [provider]  hydrocortisone (ANUSOL-HC) 25 MG suppository Place 1 suppository (25 mg total) rectally 2 (two) times daily. Use  for 7 days. 01/18/18   Levin Erp, PA  irbesartan (AVAPRO) 300 MG tablet Take 300 mg by mouth every morning.    [provider]  methimazole (TAPAZOLE) 5 MG tablet Take 5 mg by mouth 2 (two) times daily.  10/28/15   [provider]  omeprazole (PRILOSEC) 20 MG capsule Take 1 capsule (20 mg total) by mouth daily. Take 30-60 min before dinner 05/04/18   Levin Erp, PA  ondansetron (ZOFRAN) 4 MG tablet Take 1 tablet (4 mg total) by mouth every 4 (four) hours as needed for nausea or vomiting. 04/15/17   Levin Erp, PA  pravastatin (PRAVACHOL) 40 MG tablet Take 40 mg by mouth every morning.     [provider]  traMADol (ULTRAM) 50 MG tablet Take 25-50 mg by mouth every 8 (eight) hours as needed for moderate pain.     [provider]  triamterene-hydrochlorothiazide (DYAZIDE) 37.5-25 MG capsule TAKE ONE CAPSULE BY MOUTH EVERY DAY 06/05/18   Evans Lance, MD  warfarin (COUMADIN) 2 MG tablet Take Coumadin for three weeks for postoperative protocol and then the patient may resume their previous Coumadin home regimen.  The dose may need to be adjusted based upon the INR.  Please follow the INR and titrate Coumadin dose for a therapeutic range between 2.0 and 3.0 INR.  After completing the three weeks of Coumadin, the patient may resume their previous Coumadin home regimen.   CURRENTLY HOLDING THE COUMADIN FOR THE CORTISONE INJECTION Patient taking differently: Take 4 mg by mouth on MWF and 2 mg all other days 01/10/17   Hosie Poisson, MD    Family History Family History  Problem Relation Age of Onset  . Heart disease Mother   . Leukemia Father   . Colon cancer Neg Hx   . Esophageal cancer Neg Hx   . Stomach cancer Neg Hx   . Pancreatic cancer Neg Hx   . Liver disease Neg Hx   . Colon polyps Neg Hx     Social History Social History   Tobacco Use  . Smoking status: Former Smoker    Last attempt to quit: 12/18/1973    Years  since quitting: 44.6  . Smokeless tobacco: Never Used  . Tobacco comment: Smoker for 25 years and quit at around age 77  Substance Use Topics  . Alcohol use: No  . Drug use: No     Allergies   Morphine and related   Review of Systems Review of Systems  Constitutional: Negative for fever.  Respiratory: Positive for shortness of breath.   Cardiovascular: Positive for chest pain.  Gastrointestinal: Negative for abdominal pain and vomiting.  Musculoskeletal: Positive for arthralgias (left shoulder).  All other systems reviewed and are negative.    Physical Exam Updated Vital Signs BP (!) 170/73 (BP Location: Right Arm)   Pulse (!) 59   Temp 98.1 F (36.7 C) (Oral)   Resp 17   Ht 5' 2.5" (1.588 m)   Wt 69.4 kg   SpO2 100%   BMI 27.54 kg/m   Physical Exam Vitals signs and nursing note reviewed.  Constitutional:      General: She is not in acute distress.    Appearance: She is well-developed. She is not ill-appearing or diaphoretic.  HENT:     Head: Normocephalic and atraumatic.     Right Ear: External ear normal.     Left Ear: External ear normal.     Nose: Nose normal.  Eyes:     General:        Right eye: No discharge.        Left eye: No discharge.  Cardiovascular:     Rate and Rhythm: Normal rate and regular rhythm.     Pulses:          Radial pulses are 2+ on the right side.     Heart sounds: Normal heart sounds.  Pulmonary:     Effort: Pulmonary effort is normal.     Breath sounds: Normal breath sounds.  Chest:     Chest wall: No tenderness.  Abdominal:     Palpations: Abdomen is soft.     Tenderness: There is no abdominal tenderness.  Musculoskeletal:     Comments: Left shoulder is not tender or swollen. No erythema. Somewhat limited ROM on my exam but no pain  Skin:    General: Skin is warm and dry.  Neurological:     Mental Status: She is alert.  Psychiatric:        Mood and Affect: Mood is not anxious.      ED Treatments / Results  Labs  (all labs ordered are listed, but only abnormal results are displayed) Labs Reviewed  BASIC METABOLIC PANEL - Abnormal; Notable for the following components:      Result Value   Sodium 132 (*)    Chloride 97 (*)    All other components within normal limits  CBC WITH DIFFERENTIAL/PLATELET - Abnormal; Notable for the following components:   Hemoglobin 11.0 (*)    HCT 34.6 (*)    All other components within normal limits  PROTIME-INR - Abnormal; Notable for the following components:   Prothrombin Time 24.2 (*)    INR 2.2 (*)    All other components within normal limits  TROPONIN I    EKG EKG Interpretation  Date/Time:  Tuesday July 18 2018 13:03:07 EDT Ventricular Rate:  60 PR Interval:    QRS Duration: 154 QT Interval:  480 QTC Calculation: 480 R Axis:   -92 Text Interpretation:  Sinus rhythm Nonspecific IVCD with LAD Inferior infarct, old paced rhythm no significant change since 2019 Confirmed by Sherwood Gambler [phone]) on 07/18/2018 1:15:52 PM   Radiology Dg Chest 2 View  Result Date: 07/18/2018 CLINICAL DATA:  Chest pressure and discomfort. History of atrial fibrillation EXAM: CHEST - 2 VIEW COMPARISON:  Chest radiograph June 11, 2016; chest CT June 12, 2016 FINDINGS: There is scarring in the left base. The lungs elsewhere are clear. Heart is upper normal in size with pulmonary vascularity normal. Pacemaker lead is attached to the right ventricle. No adenopathy.  There is aortic atherosclerosis. There is degenerative change in each shoulder. IMPRESSION: Scarring left base. No edema or consolidation. Stable cardiac silhouette. Pacemaker lead attached to right ventricle. Aortic Atherosclerosis (ICD10-I70.0). Electronically Signed   By: Lowella Grip III M.D.   On: 07/18/2018 13:50    Procedures Procedures (including critical care time)  Medications Ordered in ED Medications  aspirin chewable tablet 324 mg (has no administration in time range)  nitroGLYCERIN (NITROSTAT)  SL tablet 0.4 mg (has no administration in time range)     Initial Impression / Assessment and Plan / ED Course  I have reviewed the triage vital signs and the nursing notes.  Pertinent labs & imaging results that were available during my care of the patient were reviewed by me and considered in my medical decision making (see chart for details).        Patient's chest pain is improved without treatment prior to being given nitroglycerin.  Her history is atypical but with her age and risk factors there is concern for ACS.  I think PE or aortic dissection is pretty low likelihood.  She will need ACS rule out.  Admit to the hospitalist service.  Initial troponin negative.  Final Clinical Impressions(s) / ED Diagnoses   Final diagnoses:  Nonspecific chest pain    ED Discharge Orders    None       Sherwood Gambler, MD 07/18/18 1529

## 2018-07-18 NOTE — H&P (Addendum)
History and Physical    [name] MEQ:[id number] DOB: 19-Jun-1925 DOA: 07/18/2018  Referring MD/NP/PA:   PCP: [name], MD   Patient coming from:  The patient is coming from home.  At baseline, pt is independent for most of ADL.        Chief Complaint: Intermittent chest pain for about a week   HPI: [name] is a 85 y.o. female with medical history significant of HTN, a-fib on warfarin, bradycardia s/p PM, arthritis, anxiety and insomnia, presented with intermittent chest pain for about a week.  Her pain located at mid and lower chest, nonradiating, pressure-like, 3-5/10, lasted a few minutes to hours, no significant aggravating or relieving factors.  Not associated with headache, dizziness, nausea, vomiting, diaphoresis, SOB or palpitation. She also reports neck and left shoulder pain but not related to her chest pain, it is chronic. She reports anxiety and insomnia due to worrying about COVID-19. She has been staying home and had no contact with COVID-19 pt. she visited her PCP today and was referred to ED.   ED Course: pt was afebrile, no hypoxia, HR 59, BP 128/63-181/76, she was given aspirin 324mg , troponin negative, EKG with V pacing, not able to evaluate for ischemia. Pt is admit for chest pain.   Review of Systems:   General: no fevers, chills, no body weight gain HEENT: no blurry vision, hearing changes or sore throat Respiratory: no dyspnea, coughing, wheezing CV: chest pain, no palpitations GI: no nausea, vomiting, abdominal pain, diarrhea, constipation GU: no dysuria, burning on urination, increased urinary frequency, hematuria  Ext: no leg edema Neuro: no unilateral weakness, numbness, or tingling, no vision change or hearing loss Skin: no rash, no skin tear. MSK: neck pain, shoulder pain, No muscle spasm, no deformity,  Heme: No easy bruising.  Travel history: No recent long distant travel.  Allergy:  Allergies  Allergen Reactions   Morphine  And Related Nausea And Vomiting    Past Medical History:  Diagnosis Date   Anxiety    Atrial fibrillation ([facility])    Bradycardia    Cancer ([facility])    skin   Cataract    removed bilat with lens both eyes    Complication of anesthesia    Difficulty sleeping    Dysrhythmia    A FIB   GERD (gastroesophageal reflux disease)    H/O hiatal hernia    History of kidney stones    History of skin cancer    History of transfusion    Hyperlipidemia    Hypertension    Osteoarthritis    Pacemaker    Pneumonia    PONV (postoperative nausea and vomiting)    Shortness of breath    WITH EXERTION    Past Surgical History:  Procedure Laterality Date   APPENDECTOMY  1960   ARTHROPLASTY  1994   Left total knee   ARTHROPLASTY  2000   Total right knee   BACK SURGERY     CATARACT EXTRACTION, BILATERAL     with lens implants   CHOLECYSTECTOMY     COLONOSCOPY     DG SELECTED HSG GDC ONLY  2002   Dilation   Microdiskectomy  05/2004   Left L4-L5   PACEMAKER INSERTION     POLYPECTOMY     TOTAL ABDOMINAL HYSTERECTOMY  1969   TOTAL HIP ARTHROPLASTY Right 12/24/2013   Procedure: RIGHT TOTAL HIP ARTHROPLASTY ANTERIOR APPROACH;  Surgeon: Gearlean Alf, MD;  Location: [facility];  Service: Orthopedics;  Laterality: Right;   UPPER GASTROINTESTINAL ENDOSCOPY      Social History:  reports that she quit smoking about 44 years ago. She has never used smokeless tobacco. She reports that she does not drink alcohol or use drugs.  Family History:  Family History  Problem Relation Age of Onset   Heart disease Mother    Leukemia Father    Colon cancer Neg Hx    Esophageal cancer Neg Hx    Stomach cancer Neg Hx    Pancreatic cancer Neg Hx    Liver disease Neg Hx    Colon polyps Neg Hx      Prior to Admission medications   Medication Sig Start Date End Date Taking? Authorizing Provider  amLODipine (NORVASC) 10 MG tablet Take 10 mg by mouth every morning.     Yes [provider]  clonazePAM (KLONOPIN) 0.5 MG tablet Take 0.5 mg by mouth daily as needed for anxiety.  03/18/15  Yes [provider]  DENTA 5000 PLUS 1.1 % CREA dental cream Take 1 application by mouth as directed. 06/27/18  Yes [provider]  famotidine (PEPCID) 20 MG tablet Take 1 tablet (20 mg total) by mouth 2 (two) times daily. Take every AM and at bedtime 05/04/18  Yes Levin Erp, [place]  omeprazole (PRILOSEC) 20 MG capsule Take 1 capsule (20 mg total) by mouth daily. Take 30-60 min before dinner 05/04/18  Yes Lemmon, Lavone Nian, PA  ondansetron (ZOFRAN) 4 MG tablet Take 1 tablet (4 mg total) by mouth every 4 (four) hours as needed for nausea or vomiting. 04/15/17  Yes Levin Erp, PA  traMADol (ULTRAM) 50 MG tablet Take 25-50 mg by mouth every 8 (eight) hours as needed for moderate pain.    Yes [provider]  triamterene-hydrochlorothiazide (DYAZIDE) 37.5-25 MG capsule TAKE ONE CAPSULE BY MOUTH EVERY DAY Patient taking differently: Take 1 capsule by mouth daily.  06/05/18  Yes Evans Lance, MD  warfarin (COUMADIN) 2 MG tablet Take Coumadin for three weeks for postoperative protocol and then the patient may resume their previous Coumadin home regimen.  The dose may need to be adjusted based upon the INR.  Please follow the INR and titrate Coumadin dose for a therapeutic range between 2.0 and 3.0 INR.  After completing the three weeks of Coumadin, the patient may resume their previous Coumadin home regimen.   CURRENTLY HOLDING THE COUMADIN FOR THE CORTISONE INJECTION Patient taking differently: Take 2-4 mg by mouth See admin instructions. Take 4 mg by mouth on MWF and 2 mg all other days 01/10/17  Yes Hosie Poisson, MD  acetaminophen (TYLENOL) 325 MG tablet Take 650 mg by mouth every 6 (six) hours as needed (pain).    [provider]  glycerin adult 2 g suppository Place 1 suppository rectally as needed for constipation.     [provider]  hydrocortisone (ANUSOL-HC) 25 MG suppository Place 1 suppository (25 mg total) rectally 2 (two) times daily. Use for 7 days. Patient not taking: Reported on 07/18/2018 01/18/18   Levin Erp, PA  irbesartan (AVAPRO) 300 MG tablet Take 300 mg by mouth every morning.    [provider]  methimazole (TAPAZOLE) 5 MG tablet Take 5 mg by mouth 2 (two) times daily.  10/28/15   [provider]  pravastatin (PRAVACHOL) 40 MG tablet Take 40 mg by mouth every morning.     [provider]    Physical Exam: Vitals:   07/18/18 1545 07/18/18  1600 07/18/18 1615 07/18/18 1630  BP: (!) 154/68 (!) 149/71 (!) 142/75 (!) 181/76  Pulse: (!) 58 60 (!) 59 63  Resp:      Temp:      TempSrc:      SpO2: 100% 99% 99% 100%  Weight:      Height:       General: Not in acute distress HEENT:       Eyes: PERRL, EOMI, no scleral icterus.       ENT: No discharge from the ears and nose,        Neck: No JVD, no bruit, no mass felt. Cardiac: S1/S2, RRR, No murmurs, No gallops or rubs. Respiratory: Good air movement bilaterally. No rales, wheezing, rhonchi or rubs. GI: Soft, nondistended, nontender, no rebound pain, no organomegaly, BS present. GU: No hematuria Ext: No pitting leg edema bilaterally.  Musculoskeletal: No joint deformities, No joint redness or warmth, Skin: No rashes.  Neuro: Alert, oriented X3, cranial nerves II-XII grossly intact, moves all extremities normally. Muscle strength 5/5 in all extremities. Psych: Patient is not psychotic, no suicidal or hemocidal ideation.  Labs on Admission: I have personally reviewed following labs and imaging studies  CBC: Recent Labs  Lab 07/18/18 1407  WBC 7.1  NEUTROABS 5.4  HGB 11.0*  HCT 34.6*  MCV 86.9  PLT 010   Basic Metabolic Panel: Recent Labs  Lab 07/18/18 1407  NA 132*  K 3.9  CL 97*  CO2 24  GLUCOSE 98  BUN 13  CREATININE 0.85  CALCIUM 9.4   GFR: Estimated Creatinine  Clearance: 40.6 mL/min (by C-G formula based on SCr of 0.85 mg/dL). Liver Function Tests: No results for input(s): AST, ALT, ALKPHOS, BILITOT, PROT, ALBUMIN in the last 168 hours. No results for input(s): LIPASE, AMYLASE in the last 168 hours. No results for input(s): AMMONIA in the last 168 hours. Coagulation Profile: Recent Labs  Lab 07/18/18 1407  INR 2.2*   Cardiac Enzymes: Recent Labs  Lab 07/18/18 1407  TROPONINI <0.03   BNP (last 3 results) No results for input(s): PROBNP in the last 8760 hours. HbA1C: No results for input(s): HGBA1C in the last 72 hours. CBG: No results for input(s): GLUCAP in the last 168 hours. Lipid Profile: No results for input(s): CHOL, HDL, LDLCALC, TRIG, CHOLHDL, LDLDIRECT in the last 72 hours. Thyroid Function Tests: No results for input(s): TSH, T4TOTAL, FREET4, T3FREE, THYROIDAB in the last 72 hours. Anemia Panel: No results for input(s): VITAMINB12, FOLATE, FERRITIN, TIBC, IRON, RETICCTPCT in the last 72 hours. Urine analysis:    Component Value Date/Time   COLORURINE STRAW (A) 03/27/2018 1432   APPEARANCEUR CLEAR 03/27/2018 1432   LABSPEC 1.003 (L) 03/27/2018 1432   PHURINE 7.0 03/27/2018 1432   GLUCOSEU NEGATIVE 03/27/2018 1432   HGBUR NEGATIVE 03/27/2018 1432   BILIRUBINUR NEGATIVE 03/27/2018 1432   KETONESUR NEGATIVE 03/27/2018 1432   PROTEINUR NEGATIVE 03/27/2018 1432   UROBILINOGEN 0.2 11/12/2014 1331   NITRITE NEGATIVE 03/27/2018 1432   LEUKOCYTESUR SMALL (A) 03/27/2018 1432   Sepsis Labs: @LABRCNTIP (procalcitonin:4,lacticidven:4) )No results found for this or any previous visit (from the past 240 hour(s)).   Radiological Exams on Admission: Dg Chest 2 View  Result Date: 07/18/2018 CLINICAL DATA:  Chest pressure and discomfort. History of atrial fibrillation EXAM: CHEST - 2 VIEW COMPARISON:  Chest radiograph June 11, 2016; chest CT June 12, 2016 FINDINGS: There is scarring in the left base. The lungs elsewhere are clear.  Heart is upper normal in size with  pulmonary vascularity normal. Pacemaker lead is attached to the right ventricle. No adenopathy. There is aortic atherosclerosis. There is degenerative change in each shoulder. IMPRESSION: Scarring left base. No edema or consolidation. Stable cardiac silhouette. Pacemaker lead attached to right ventricle. Aortic Atherosclerosis (ICD10-I70.0). Electronically Signed   By: Lowella Grip III M.D.   On: 07/18/2018 13:50   EKG: Independently reviewed.  V-pacing rate 60  Assessment/Plan Principal Problem:   Chest pain Active Problems:   Essential hypertension   ATRIAL FIBRILLATION   Bradycardia   GERD   Osteoarthritis   Cardiac pacemaker in situ   Chronic anticoagulation   Anxiety   Insomnia  [name] is a 85 y.o. female with medical history significant of HTN, a-fib on warfarin, bradycardia s/p PM, arthritis, anxiety and insomnia, presented with intermittent chest pain for about a week.    Chest pain to rule out ACS Pt presented with recurrent chest pain Troponin negative, EKG with V pacing, not able to evaluate for ischemia.  Plan: Admit to tele for observation Trend troponin and check echo for wall motion  Cardiology consult for stress test as outpatient  HTN, uncontrolled Continue with amlodipine, irbesartan and diuretics Adjust medication accordingly PRN hydralazine  A-fib on warfarin, bradycardia s/p PM,  INR therapeutic  GERD PPI  anxiety and insomnia Continue with home management  DVT ppx: warfarin Code Status: Full code Family Communication: None at bed side.             Disposition Plan:  Anticipate discharge back to previous home environment Consults called:  none Admission status: Obs / tele       Date of Service 07/18/2018    [place] [facility]   If 7PM-7AM, please contact night-coverage [email or web address] Password [facility] 07/18/2018, 4:44 PM

## 2018-07-18 NOTE — ED Provider Notes (Signed)
Care assumed from Dr. Regenia Skeeter, please see their notes for full documentation of patient's complaint/HPI. Briefly, pt here with substernal chest pain and shortness of breath x a couple of days. Pt also complaining of atraumatic left shoulder pain x 1 week prior to chest pain beginning. Results so far show paced EKG, normal CXR, and unremarkable blood work including negative troponin. Plan is to admit patient for cardiac workup.   Physical Exam  BP (!) 170/73 (BP Location: Right Arm)   Pulse (!) 59   Temp 98.1 F (36.7 C) (Oral)   Resp 17   Ht 5' 2.5" (1.588 m)   Wt 69.4 kg   SpO2 100%   BMI 27.54 kg/m   Physical Exam Vitals signs and nursing note reviewed.  Constitutional:      Appearance: She is not ill-appearing.  HENT:     Head: Normocephalic and atraumatic.  Eyes:     Conjunctiva/sclera: Conjunctivae normal.  Neck:     Musculoskeletal: Neck supple.  Cardiovascular:     Rate and Rhythm: Normal rate and regular rhythm.     Pulses: Normal pulses.     Comments: Pacemaker to left chest wall Pulmonary:     Effort: Pulmonary effort is normal.     Breath sounds: Normal breath sounds. No wheezing, rhonchi or rales.  Abdominal:     Palpations: Abdomen is soft.     Tenderness: There is no abdominal tenderness.  Skin:    General: Skin is warm and dry.  Neurological:     Mental Status: She is alert.     ED Course/Procedures     Procedures  Results for orders placed or performed during the hospital encounter of 70/62/37  Basic metabolic panel  Result Value Ref Range   Sodium 132 (L) 135 - 145 mmol/L   Potassium 3.9 3.5 - 5.1 mmol/L   Chloride 97 (L) 98 - 111 mmol/L   CO2 24 22 - 32 mmol/L   Glucose, Bld 98 70 - 99 mg/dL   BUN 13 8 - 23 mg/dL   Creatinine, Ser 0.85 0.44 - 1.00 mg/dL   Calcium 9.4 8.9 - 10.3 mg/dL   GFR calc non Af Amer >60 >60 mL/min   GFR calc Af Amer >60 >60 mL/min   Anion gap 11 5 - 15  Troponin I - Once  Result Value Ref Range   Troponin I <0.03  <0.03 ng/mL  CBC with Differential  Result Value Ref Range   WBC 7.1 4.0 - 10.5 K/uL   RBC 3.98 3.87 - 5.11 MIL/uL   Hemoglobin 11.0 (L) 12.0 - 15.0 g/dL   HCT 34.6 (L) 36.0 - 46.0 %   MCV 86.9 80.0 - 100.0 fL   MCH 27.6 26.0 - 34.0 pg   MCHC 31.8 30.0 - 36.0 g/dL   RDW 14.2 11.5 - 15.5 %   Platelets 309 150 - 400 K/uL   nRBC 0.0 0.0 - 0.2 %   Neutrophils Relative % 76 %   Neutro Abs 5.4 1.7 - 7.7 K/uL   Lymphocytes Relative 13 %   Lymphs Abs 0.9 0.7 - 4.0 K/uL   Monocytes Relative 8 %   Monocytes Absolute 0.5 0.1 - 1.0 K/uL   Eosinophils Relative 2 %   Eosinophils Absolute 0.2 0.0 - 0.5 K/uL   Basophils Relative 1 %   Basophils Absolute 0.0 0.0 - 0.1 K/uL   Immature Granulocytes 0 %   Abs Immature Granulocytes 0.02 0.00 - 0.07 K/uL  Protime-INR  Result  Value Ref Range   Prothrombin Time 24.2 (H) 11.4 - 15.2 seconds   INR 2.2 (H) 0.8 - 1.2   Dg Chest 2 View  Result Date: 07/18/2018 CLINICAL DATA:  Chest pressure and discomfort. History of atrial fibrillation EXAM: CHEST - 2 VIEW COMPARISON:  Chest radiograph June 11, 2016; chest CT June 12, 2016 FINDINGS: There is scarring in the left base. The lungs elsewhere are clear. Heart is upper normal in size with pulmonary vascularity normal. Pacemaker lead is attached to the right ventricle. No adenopathy. There is aortic atherosclerosis. There is degenerative change in each shoulder. IMPRESSION: Scarring left base. No edema or consolidation. Stable cardiac silhouette. Pacemaker lead attached to right ventricle. Aortic Atherosclerosis (ICD10-I70.0). Electronically Signed   By: Lowella Grip III M.D.   On: 07/18/2018 13:50     Meds ordered this encounter  Medications  . aspirin chewable tablet 324 mg  . nitroGLYCERIN (NITROSTAT) SL tablet 0.4 mg     MDM  Pt presenting with chest pain and shortness of breath x a couple of days. EKG shows paced rhythm. Labs unremarkable including negative troponin. Given pt's age will admit  for cardiac workup. Dr. Regenia Skeeter to consult. Updated patient on plan; she is in agreement at this time.        Eustaquio Maize, PA-C 07/18/18 1735    Sherwood Gambler, MD 07/19/18 [phone]

## 2018-07-18 NOTE — ED Notes (Signed)
Patient transported to X-ray 

## 2018-07-19 ENCOUNTER — Observation Stay (HOSPITAL_BASED_OUTPATIENT_CLINIC_OR_DEPARTMENT_OTHER): Payer: Medicare Other

## 2018-07-19 DIAGNOSIS — I361 Nonrheumatic tricuspid (valve) insufficiency: Secondary | ICD-10-CM | POA: Diagnosis not present

## 2018-07-19 DIAGNOSIS — R0789 Other chest pain: Secondary | ICD-10-CM | POA: Diagnosis not present

## 2018-07-19 DIAGNOSIS — I340 Nonrheumatic mitral (valve) insufficiency: Secondary | ICD-10-CM | POA: Diagnosis not present

## 2018-07-19 DIAGNOSIS — R079 Chest pain, unspecified: Secondary | ICD-10-CM | POA: Diagnosis not present

## 2018-07-19 LAB — NA AND K (SODIUM & POTASSIUM), RAND UR
Potassium Urine: 26 mmol/L
Sodium, Ur: 45 mmol/L

## 2018-07-19 LAB — BASIC METABOLIC PANEL
Anion gap: 8 (ref 5–15)
BUN: 14 mg/dL (ref 8–23)
CO2: 23 mmol/L (ref 22–32)
Calcium: 8.8 mg/dL — ABNORMAL LOW (ref 8.9–10.3)
Chloride: 96 mmol/L — ABNORMAL LOW (ref 98–111)
Creatinine, Ser: 0.82 mg/dL (ref 0.44–1.00)
GFR calc Af Amer: >60 mL/min (ref >60)
GFR calc non Af Amer: >60 mL/min (ref >60)
Glucose, Bld: 111 mg/dL — ABNORMAL HIGH (ref 70–99)
Potassium: 4.7 mmol/L (ref 3.5–5.1)
Sodium: 127 mmol/L — ABNORMAL LOW (ref 135–145)

## 2018-07-19 LAB — TROPONIN I: Troponin I: <0.03 ng/mL (ref <0.03)

## 2018-07-19 LAB — ECHOCARDIOGRAM COMPLETE
Height: 62.5 in
Weight: 2497.37 oz

## 2018-07-19 LAB — LIPID PANEL
Cholesterol: 177 mg/dL (ref 0–200)
HDL: 76 mg/dL (ref >40)
LDL Cholesterol: 89 mg/dL (ref 0–99)
Total CHOL/HDL Ratio: 2.3 RATIO
Triglycerides: 60 mg/dL (ref <150)
VLDL: 12 mg/dL (ref 0–40)

## 2018-07-19 LAB — PROTIME-INR
INR: 2.2 — ABNORMAL HIGH (ref 0.8–1.2)
Prothrombin Time: 24.5 seconds — ABNORMAL HIGH (ref 11.4–15.2)

## 2018-07-19 LAB — OSMOLALITY, URINE: Osmolality, Ur: 288 mOsm/kg — ABNORMAL LOW (ref 300–900)

## 2018-07-19 LAB — OSMOLALITY: Osmolality: 276 mOsm/kg (ref 275–295)

## 2018-07-19 LAB — TSH: TSH: 5.81 u[IU]/mL — ABNORMAL HIGH (ref 0.350–4.500)

## 2018-07-19 LAB — T4, FREE: Free T4: 0.94 ng/dL (ref 0.82–1.77)

## 2018-07-19 MED ORDER — SODIUM CHLORIDE 0.9 % IV BOLUS
250.0000 mL | Freq: Once | INTRAVENOUS | Status: AC
  Administered 2018-07-19: 250 mL via INTRAVENOUS

## 2018-07-19 MED ORDER — METHIMAZOLE 5 MG PO TABS: 5.0000 mg | ORAL_TABLET | Freq: Every day | ORAL | Status: DC

## 2018-07-19 MED ORDER — WARFARIN SODIUM 2 MG PO TABS: 2.0000 mg | ORAL_TABLET | ORAL | Status: DC

## 2018-07-19 MED ORDER — WARFARIN SODIUM 4 MG PO TABS: 4.0000 mg | ORAL_TABLET | Freq: Once | ORAL | Status: DC

## 2018-07-19 MED ORDER — WARFARIN - PHARMACIST DOSING INPATIENT: Freq: Every day | Status: DC

## 2018-07-19 NOTE — Progress Notes (Signed)
Ambulated patient in the hallway on RA using front wheel walker. Pt tolerated well, denied any SOB or dizziness.

## 2018-07-19 NOTE — Care Management Obs Status (Signed)
[facility] NOTIFICATION   Patient Details  Name: [name] MRN: [id number] Date of Birth: Aug 27, 1925   Medicare Observation Status Notification Given:  Yes    Carles Collet, RN 07/19/2018, 11:06 AM

## 2018-07-19 NOTE — Progress Notes (Signed)
  Echocardiogram 2D Echocardiogram has been performed.  [name] 07/19/2018, 10:23 AM

## 2018-07-19 NOTE — Progress Notes (Signed)
Patient discharged in stable condition. No complaints of CP. IV removed intact. AVS reviewed and questions answered.

## 2018-07-19 NOTE — Progress Notes (Addendum)
[facility] for warfarin Indication: atrial fibrillation  Allergies  Allergen Reactions  . Morphine And Related Nausea And Vomiting    Patient Measurements: Height: 5' 2.5" (158.8 cm) Weight: 156 lb 1.4 oz (70.8 kg) IBW/kg (Calculated) : 51.25  Vital Signs: Temp: 98 F (36.7 C) (04/22 0414) Temp Source: Oral (04/22 0414) BP: 153/74 (04/22 0414) Pulse Rate: 60 (04/22 0414)  Labs: Recent Labs    07/18/18 1407 07/18/18 1824 07/19/18 0021  HGB 11.0*  --   --   HCT 34.6*  --   --   PLT 309  --   --   LABPROT 24.2*  --   --   INR 2.2*  --   --   CREATININE 0.85  --  0.82  TROPONINI <0.03 <0.03 <0.03    Estimated Creatinine Clearance: 42.5 mL/min (by C-G formula based on SCr of 0.82 mg/dL).   Medical History: Past Medical History:  Diagnosis Date  . Anxiety   . Atrial fibrillation ([place])   . Bradycardia   . Cancer ([place])    skin  . Cataract    removed bilat with lens both eyes   . Complication of anesthesia   . Difficulty sleeping   . Dysrhythmia    A FIB  . GERD (gastroesophageal reflux disease)   . H/O hiatal hernia   . History of kidney stones   . History of skin cancer   . History of transfusion   . Hyperlipidemia   . Hypertension   . Osteoarthritis   . Pacemaker   . Pneumonia   . PONV (postoperative nausea and vomiting)   . Shortness of breath    WITH EXERTION    Assessment: 89 yoF on warfarin at home for PAF admitted with intermittent CP. INR 2.2 on admit, pt reports taking dose already prior to admission on 4/21.  INR today is therapeutic at 2.2. CBC stable on admission. No s/sx of bleeding.   *PTA Warfarin dose = 4mg  MWF, 2mg  all other days  Goal of Therapy:  INR 2-3 Monitor platelets by anticoagulation protocol: Yes   Plan:  -Warfarin 4 tonight as per home regimen -INR daily  Antonietta Jewel, PharmD, [facility] Clinical Pharmacist  Pager: [phone] Phone: [phone] Please check AMION for all [facility]  numbers 07/19/2018

## 2018-07-19 NOTE — Plan of Care (Signed)
  Problem: Clinical Measurements: Goal: Ability to maintain clinical measurements within normal limits will improve Outcome: Progressing Goal: Diagnostic test results will improve Outcome: Progressing   

## 2018-07-19 NOTE — Discharge Summary (Signed)
Physician Discharge Summary  [name]:[id number] DOB: 21-Jan-1926 DOA: 07/18/2018  PCP: [name], MD  Admit date: 07/18/2018 Discharge date: 07/19/2018  Admitted From: home Discharge disposition: home   Recommendations for Outpatient Follow-Up:   1. BMP 1 week 2. Monitor TSH-- have changed tapazole as TSH > 5 3. Further titration of BP medications for better control   Discharge Diagnosis:   Principal Problem:   Nonspecific chest pain Active Problems:   Essential hypertension   ATRIAL FIBRILLATION   Bradycardia   GERD   Osteoarthritis   Cardiac pacemaker in situ   Chronic anticoagulation   Anxiety   Insomnia    Discharge Condition: Improved.  Diet recommendation: Low sodium, heart healthy  Wound care: None.  Code status: dnr.   History of Present Illness:   [name] is a 85 y.o. female with medical history significant of HTN, a-fib on warfarin, bradycardia s/p PM, arthritis, anxiety and insomnia, presented with intermittent chest pain for about a week.  Her pain located at mid and lower chest, nonradiating, pressure-like, 3-5/10, lasted a few minutes to hours, no significant aggravating or relieving factors.  Not associated with headache, dizziness, nausea, vomiting, diaphoresis, SOB or palpitation. She also reports neck and left shoulder pain but not related to her chest pain, it is chronic. She reports anxiety and insomnia due to worrying about COVID-19. She has been staying home and had no contact with COVID-19 pt. she visited her PCP today and was referred to ED.    Hospital Course by Problem:   Chest pain to rule out ACS Pt presented with recurrent chest pain Troponin negative, EKG with V pacing, not able to evaluate for ischemia.  -CE negative and echo:  1. The left ventricle has hyperdynamic systolic function, with an ejection fraction of >65%. The cavity size was normal. Left ventricular diastolic Doppler parameters are  consistent with pseudonormalization. Elevated left ventricular end-diastolic  pressure.  2. The right ventricle has normal systolic function. The cavity was normal. There is no increase in right ventricular wall thickness.  3. Left atrial size was severely dilated.  4. Right atrial size was moderately dilated.  5. The mitral valve is degenerative. There is moderate mitral annular calcification present.  6. The aortic valve is tricuspid. Moderate thickening of the aortic valve. Moderate calcification of the aortic valve. Aortic valve regurgitation is mild by color flow Doppler.  Hyponatremia -NPO overnight -suspect volume down -will bolus 569mls -BMP as an outpatient  HTN, uncontrolled Continue with amlodipine, irbesartan and diuretics Adjust medication accordingly as an outpatient   A-fib on warfarin, bradycardia s/p PM,  INR therapeutic  GERD PPI  anxiety and insomnia Continue with home management    Medical Consultants:      Discharge Exam:   Vitals:   07/18/18 2000 07/19/18 0414  BP:  (!) 153/74  Pulse: 60 60  Resp:  18  Temp:  98 F (36.7 C)  SpO2: 100% 94%   Vitals:   07/18/18 1630 07/18/18 1955 07/18/18 2000 07/19/18 0414  BP: (!) 181/76 (!) 163/67  (!) 153/74  Pulse: 63 (!) 59 60 60  Resp:  16  18  Temp:  97.9 F (36.6 C)  98 F (36.7 C)  TempSrc:  Oral  Oral  SpO2: 100% 100% 100% 94%  Weight:    70.8 kg  Height:        General exam: Appears calm and comfortable. No further chest pain  The results of significant diagnostics from this hospitalization (including imaging, microbiology, ancillary and laboratory) are listed below for reference.     Procedures and Diagnostic Studies:   Dg Chest 2 View  Result Date: 07/18/2018 CLINICAL DATA:  Chest pressure and discomfort. History of atrial fibrillation EXAM: CHEST - 2 VIEW COMPARISON:  Chest radiograph June 11, 2016; chest CT June 12, 2016 FINDINGS: There is scarring in the left base. The  lungs elsewhere are clear. Heart is upper normal in size with pulmonary vascularity normal. Pacemaker lead is attached to the right ventricle. No adenopathy. There is aortic atherosclerosis. There is degenerative change in each shoulder. IMPRESSION: Scarring left base. No edema or consolidation. Stable cardiac silhouette. Pacemaker lead attached to right ventricle. Aortic Atherosclerosis (ICD10-I70.0). Electronically Signed   By: Lowella Grip III M.D.   On: 07/18/2018 13:50     Labs:   Basic Metabolic Panel: Recent Labs  Lab 07/18/18 1407 07/19/18 0021  NA 132* 127*  K 3.9 4.7  CL 97* 96*  CO2 24 23  GLUCOSE 98 111*  BUN 13 14  CREATININE 0.85 0.82  CALCIUM 9.4 8.8*   GFR Estimated Creatinine Clearance: 42.5 mL/min (by C-G formula based on SCr of 0.82 mg/dL). Liver Function Tests: No results for input(s): AST, ALT, ALKPHOS, BILITOT, PROT, ALBUMIN in the last 168 hours. No results for input(s): LIPASE, AMYLASE in the last 168 hours. No results for input(s): AMMONIA in the last 168 hours. Coagulation profile Recent Labs  Lab 07/18/18 1407  INR 2.2*    CBC: Recent Labs  Lab 07/18/18 1407  WBC 7.1  NEUTROABS 5.4  HGB 11.0*  HCT 34.6*  MCV 86.9  PLT 309   Cardiac Enzymes: Recent Labs  Lab 07/18/18 1407 07/18/18 1824 07/19/18 0021  TROPONINI <0.03 <0.03 <0.03   BNP: Invalid input(s): POCBNP CBG: No results for input(s): GLUCAP in the last 168 hours. D-Dimer No results for input(s): DDIMER in the last 72 hours. Hgb A1c No results for input(s): HGBA1C in the last 72 hours. Lipid Profile Recent Labs    07/19/18 0021  CHOL 177  HDL 76  LDLCALC 89  TRIG 60  CHOLHDL 2.3   Thyroid function studies Recent Labs    07/19/18 0021  TSH 5.810*   Anemia work up No results for input(s): VITAMINB12, FOLATE, FERRITIN, TIBC, IRON, RETICCTPCT in the last 72 hours. Microbiology No results found for this or any previous visit (from the past 240 hour(s)).    Discharge Instructions:   Discharge Instructions    Diet - low sodium heart healthy   Complete by:  As directed    Discharge instructions   Complete by:  As directed    Follow up with PCP 1 week for INR check, BMP, as well as TSH/discussion of tapazole ( I have decreased to daily for now)   Increase activity slowly   Complete by:  As directed      Allergies as of 07/19/2018      Reactions   Morphine And Related Nausea And Vomiting      Medication List    STOP taking these medications   hydrocortisone 25 MG suppository Commonly known as:  ANUSOL-HC   omeprazole 20 MG capsule Commonly known as:  PRILOSEC     TAKE these medications   acetaminophen 325 MG tablet Commonly known as:  TYLENOL Take 650 mg by mouth every 6 (six) hours as needed (pain).   amLODipine 10 MG tablet Commonly known as:  NORVASC Take  10 mg by mouth every morning.   clonazePAM 0.5 MG tablet Commonly known as:  KLONOPIN Take 0.5 mg by mouth daily as needed for anxiety.   Denta 5000 Plus 1.1 % Crea dental cream Generic drug:  sodium fluoride Take 1 application by mouth as directed.   famotidine 20 MG tablet Commonly known as:  Pepcid Take 1 tablet (20 mg total) by mouth 2 (two) times daily. Take every AM and at bedtime   irbesartan 300 MG tablet Commonly known as:  AVAPRO Take 300 mg by mouth every morning.   methimazole 5 MG tablet Commonly known as:  TAPAZOLE Take 1 tablet (5 mg total) by mouth daily. Start taking on:  July 20, 2018 What changed:  when to take this   ondansetron 4 MG tablet Commonly known as:  ZOFRAN Take 1 tablet (4 mg total) by mouth every 4 (four) hours as needed for nausea or vomiting.   potassium chloride SA 20 MEQ tablet Commonly known as:  K-DUR Take 20 mEq by mouth 2 (two) times daily.   pravastatin 40 MG tablet Commonly known as:  PRAVACHOL Take 40 mg by mouth every morning.   triamterene-hydrochlorothiazide 37.5-25 MG capsule Commonly known as:   DYAZIDE TAKE ONE CAPSULE BY MOUTH EVERY DAY What changed:    how much to take  how to take this  when to take this  additional instructions   warfarin 2 MG tablet Commonly known as:  COUMADIN Take as directed. If you are unsure how to take this medication, talk to your nurse or doctor. Original instructions:  Take 1-2 tablets (2-4 mg total) by mouth See admin instructions. Take 4 mg by mouth on MWF and 2 mg all other days      Follow-up Information    Tisovec, Fransico Him, MD Follow up in 1 week(s).   Specialty:  Internal Medicine Contact information: [address] [place] [address] [phone]            Time coordinating discharge: 25 min  Signed:  Geradine Girt DO  [facility] 07/19/2018, 11:40 AM

## 2018-07-20 NOTE — Telephone Encounter (Signed)
We will follow. GT

## 2018-07-24 DIAGNOSIS — E871 Hypo-osmolality and hyponatremia: Secondary | ICD-10-CM | POA: Diagnosis not present

## 2018-07-24 DIAGNOSIS — E0520 Thyrotoxicosis with toxic multinodular goiter without thyrotoxic crisis or storm: Secondary | ICD-10-CM | POA: Diagnosis not present

## 2018-07-24 DIAGNOSIS — I130 Hypertensive heart and chronic kidney disease with heart failure and stage 1 through stage 4 chronic kidney disease, or unspecified chronic kidney disease: Secondary | ICD-10-CM | POA: Diagnosis not present

## 2018-07-24 DIAGNOSIS — Z7901 Long term (current) use of anticoagulants: Secondary | ICD-10-CM | POA: Diagnosis not present

## 2018-07-24 DIAGNOSIS — I5032 Chronic diastolic (congestive) heart failure: Secondary | ICD-10-CM | POA: Diagnosis not present

## 2018-07-24 DIAGNOSIS — N183 Chronic kidney disease, stage 3 (moderate): Secondary | ICD-10-CM | POA: Diagnosis not present

## 2018-07-24 DIAGNOSIS — R0609 Other forms of dyspnea: Secondary | ICD-10-CM | POA: Diagnosis not present

## 2018-07-24 DIAGNOSIS — I4821 Permanent atrial fibrillation: Secondary | ICD-10-CM | POA: Diagnosis not present

## 2018-08-02 ENCOUNTER — Telehealth: Payer: Self-pay | Admitting: Internal Medicine

## 2018-08-02 NOTE — Telephone Encounter (Signed)
New message   Spoke w/pt and scheduled for a phone call visit with Dr. Lovena Le on 05.14.20. Pt number is listed in appt notes.      Virtual Visit Pre-Appointment Phone Call  "(Name), I am calling you today to discuss your upcoming appointment. We are currently trying to limit exposure to the virus that causes COVID-19 by seeing patients at home rather than in the office."  1. "What is the BEST phone number to call the day of the visit?" - include this in appointment notes  2. Do you have or have access to (through a family member/friend) a smartphone with video capability that we can use for your visit?" a. If yes - list this number in appt notes as cell (if different from BEST phone #) and list the appointment type as a VIDEO visit in appointment notes b. If no - list the appointment type as a PHONE visit in appointment notes  3. Confirm consent - "In the setting of the current Covid19 crisis, you are scheduled for a (phone or video) visit with your provider on (date) at (time).  Just as we do with many in-office visits, in order for you to participate in this visit, we must obtain consent.  If you'd like, I can send this to your mychart (if signed up) or email for you to review.  Otherwise, I can obtain your verbal consent now.  All virtual visits are billed to your insurance company just like a normal visit would be.  By agreeing to a virtual visit, we'd like you to understand that the technology does not allow for your provider to perform an examination, and thus may limit your provider's ability to fully assess your condition. If your provider identifies any concerns that need to be evaluated in person, we will make arrangements to do so.  Finally, though the technology is pretty good, we cannot assure that it will always work on either your or our end, and in the setting of a video visit, we may have to convert it to a phone-only visit.  In either situation, we cannot ensure that we have a  secure connection.  Are you willing to proceed?" STAFF: Did the patient verbally acknowledge consent to telehealth visit? Document YES/NO here: YES  4. Advise patient to be prepared - "Two hours prior to your appointment, go ahead and check your blood pressure, pulse, oxygen saturation, and your weight (if you have the equipment to check those) and write them all down. When your visit starts, your provider will ask you for this information. If you have an Apple Watch or Kardia device, please plan to have heart rate information ready on the day of your appointment. Please have a pen and paper handy nearby the day of the visit as well."  5. Give patient instructions for MyChart download to smartphone OR [email or web address] as below if video visit (depending on what platform provider is using)  6. Inform patient they will receive a phone call 15 minutes prior to their appointment time (may be from unknown caller ID) so they should be prepared to answer    TELEPHONE CALL NOTE  [name] has been deemed a candidate for a follow-up tele-health visit to limit community exposure during the Covid-19 pandemic. I spoke with the patient via phone to ensure availability of phone/video source, confirm preferred email & phone number, and discuss instructions and expectations.  I reminded [name] to be prepared with any vital sign and/or heart  rhythm information that could potentially be obtained via home monitoring, at the time of her visit. I reminded [name] to expect a phone call prior to her visit.  [place] Harriette Ohara 08/02/2018 9:26 AM   INSTRUCTIONS FOR DOWNLOADING THE MYCHART APP TO SMARTPHONE  - The patient must first make sure to have activated MyChart and know their login information - If Apple, go to [employer] and type in MyChart in the search bar and download the app. If Android, ask patient to go to [employer] and type in [facility] in the search bar and download the  app. The app is free but as with any other app downloads, their phone may require them to verify saved payment information or Apple/Android password.  - The patient will need to then log into the app with their MyChart username and password, and select [facility] as their healthcare provider to link the account. When it is time for your visit, go to the MyChart app, find appointments, and click Begin Video Visit. Be sure to Select Allow for your device to access the Microphone and Camera for your visit. You will then be connected, and your provider will be with you shortly.  **If they have any issues connecting, or need assistance please contact MyChart service desk [phone])**  **If using a computer, in order to ensure the best quality for their visit they will need to use either of the following Internet Browsers: [employer], or Google Chrome**  IF USING DOXIMITY or DOXY.ME - The patient will receive a link just prior to their visit by text.     FULL LENGTH CONSENT FOR TELE-HEALTH VISIT   I hereby voluntarily request, consent and authorize [facility] and its employed or contracted physicians, physician assistants, nurse practitioners or other licensed health care professionals (the Practitioner), to provide me with telemedicine health care services (the Services") as deemed necessary by the treating Practitioner. I acknowledge and consent to receive the Services by the Practitioner via telemedicine. I understand that the telemedicine visit will involve communicating with the Practitioner through live audiovisual communication technology and the disclosure of certain medical information by electronic transmission. I acknowledge that I have been given the opportunity to request an in-person assessment or other available alternative prior to the telemedicine visit and am voluntarily participating in the telemedicine visit.  I understand that I have the right to withhold or  withdraw my consent to the use of telemedicine in the course of my care at any time, without affecting my right to future care or treatment, and that the Practitioner or I may terminate the telemedicine visit at any time. I understand that I have the right to inspect all information obtained and/or recorded in the course of the telemedicine visit and may receive copies of available information for a reasonable fee.  I understand that some of the potential risks of receiving the Services via telemedicine include:   Delay or interruption in medical evaluation due to technological equipment failure or disruption;  Information transmitted may not be sufficient (e.g. poor resolution of images) to allow for appropriate medical decision making by the Practitioner; and/or   In rare instances, security protocols could fail, causing a breach of personal health information.  Furthermore, I acknowledge that it is my responsibility to provide information about my medical history, conditions and care that is complete and accurate to the best of my ability. I acknowledge that Practitioner's advice, recommendations, and/or decision may be based on  factors not within their control, such as incomplete or inaccurate data provided by me or distortions of diagnostic images or specimens that may result from electronic transmissions. I understand that the practice of medicine is not an exact science and that Practitioner makes no warranties or guarantees regarding treatment outcomes. I acknowledge that I will receive a copy of this consent concurrently upon execution via email to the email address I last provided but may also request a printed copy by calling the office of [facility].    I understand that my insurance will be billed for this visit.   I have read or had this consent read to me.  I understand the contents of this consent, which adequately explains the benefits and risks of the Services being provided via  telemedicine.   I have been provided ample opportunity to ask questions regarding this consent and the Services and have had my questions answered to my satisfaction.  I give my informed consent for the services to be provided through the use of telemedicine in my medical care  By participating in this telemedicine visit I agree to the above.

## 2018-08-07 DIAGNOSIS — E0520 Thyrotoxicosis with toxic multinodular goiter without thyrotoxic crisis or storm: Secondary | ICD-10-CM | POA: Diagnosis not present

## 2018-08-07 DIAGNOSIS — I130 Hypertensive heart and chronic kidney disease with heart failure and stage 1 through stage 4 chronic kidney disease, or unspecified chronic kidney disease: Secondary | ICD-10-CM | POA: Diagnosis not present

## 2018-08-07 DIAGNOSIS — Z7901 Long term (current) use of anticoagulants: Secondary | ICD-10-CM | POA: Diagnosis not present

## 2018-08-07 DIAGNOSIS — N183 Chronic kidney disease, stage 3 (moderate): Secondary | ICD-10-CM | POA: Diagnosis not present

## 2018-08-08 ENCOUNTER — Other Ambulatory Visit: Payer: Self-pay

## 2018-08-08 ENCOUNTER — Inpatient Hospital Stay (HOSPITAL_COMMUNITY)
Admission: EM | Admit: 2018-08-08 | Discharge: 2018-08-11 | DRG: 309 | Disposition: A | Discharge status: Home Health | Payer: Medicare Other | Attending: Student | Admitting: Student

## 2018-08-08 ENCOUNTER — Encounter (HOSPITAL_COMMUNITY): Payer: Self-pay

## 2018-08-08 ENCOUNTER — Emergency Department (HOSPITAL_COMMUNITY): Payer: Medicare Other

## 2018-08-08 DIAGNOSIS — E871 Hypo-osmolality and hyponatremia: Secondary | ICD-10-CM | POA: Diagnosis not present

## 2018-08-08 DIAGNOSIS — E876 Hypokalemia: Secondary | ICD-10-CM | POA: Diagnosis present

## 2018-08-08 DIAGNOSIS — J9811 Atelectasis: Secondary | ICD-10-CM | POA: Diagnosis not present

## 2018-08-08 DIAGNOSIS — Z950 Presence of cardiac pacemaker: Secondary | ICD-10-CM | POA: Diagnosis not present

## 2018-08-08 DIAGNOSIS — E222 Syndrome of inappropriate secretion of antidiuretic hormone: Secondary | ICD-10-CM | POA: Diagnosis not present

## 2018-08-08 DIAGNOSIS — I10 Essential (primary) hypertension: Secondary | ICD-10-CM | POA: Diagnosis not present

## 2018-08-08 DIAGNOSIS — Z806 Family history of leukemia: Secondary | ICD-10-CM

## 2018-08-08 DIAGNOSIS — E785 Hyperlipidemia, unspecified: Secondary | ICD-10-CM | POA: Diagnosis present

## 2018-08-08 DIAGNOSIS — Z96641 Presence of right artificial hip joint: Secondary | ICD-10-CM | POA: Diagnosis present

## 2018-08-08 DIAGNOSIS — Z85828 Personal history of other malignant neoplasm of skin: Secondary | ICD-10-CM

## 2018-08-08 DIAGNOSIS — I495 Sick sinus syndrome: Secondary | ICD-10-CM | POA: Diagnosis not present

## 2018-08-08 DIAGNOSIS — R112 Nausea with vomiting, unspecified: Secondary | ICD-10-CM | POA: Diagnosis present

## 2018-08-08 DIAGNOSIS — I472 Ventricular tachycardia, unspecified: Secondary | ICD-10-CM

## 2018-08-08 DIAGNOSIS — Z79899 Other long term (current) drug therapy: Secondary | ICD-10-CM

## 2018-08-08 DIAGNOSIS — R42 Dizziness and giddiness: Secondary | ICD-10-CM | POA: Diagnosis not present

## 2018-08-08 DIAGNOSIS — Z885 Allergy status to narcotic agent status: Secondary | ICD-10-CM

## 2018-08-08 DIAGNOSIS — R1114 Bilious vomiting: Secondary | ICD-10-CM

## 2018-08-08 DIAGNOSIS — M1480 Arthropathies in other specified diseases classified elsewhere, unspecified site: Secondary | ICD-10-CM | POA: Diagnosis present

## 2018-08-08 DIAGNOSIS — Z961 Presence of intraocular lens: Secondary | ICD-10-CM | POA: Diagnosis present

## 2018-08-08 DIAGNOSIS — R1013 Epigastric pain: Secondary | ICD-10-CM | POA: Diagnosis not present

## 2018-08-08 DIAGNOSIS — I4891 Unspecified atrial fibrillation: Secondary | ICD-10-CM | POA: Diagnosis not present

## 2018-08-08 DIAGNOSIS — R000 Tachycardia, unspecified: Secondary | ICD-10-CM | POA: Diagnosis not present

## 2018-08-08 DIAGNOSIS — Z87891 Personal history of nicotine dependence: Secondary | ICD-10-CM

## 2018-08-08 DIAGNOSIS — I4821 Permanent atrial fibrillation: Secondary | ICD-10-CM | POA: Diagnosis present

## 2018-08-08 DIAGNOSIS — F419 Anxiety disorder, unspecified: Secondary | ICD-10-CM | POA: Diagnosis present

## 2018-08-08 DIAGNOSIS — E861 Hypovolemia: Secondary | ICD-10-CM | POA: Diagnosis present

## 2018-08-08 DIAGNOSIS — N281 Cyst of kidney, acquired: Secondary | ICD-10-CM | POA: Diagnosis present

## 2018-08-08 DIAGNOSIS — K219 Gastro-esophageal reflux disease without esophagitis: Secondary | ICD-10-CM | POA: Diagnosis not present

## 2018-08-08 DIAGNOSIS — M3219 Other organ or system involvement in systemic lupus erythematosus: Secondary | ICD-10-CM | POA: Diagnosis present

## 2018-08-08 DIAGNOSIS — Z7901 Long term (current) use of anticoagulants: Secondary | ICD-10-CM

## 2018-08-08 DIAGNOSIS — R110 Nausea: Secondary | ICD-10-CM | POA: Diagnosis not present

## 2018-08-08 DIAGNOSIS — E0590 Thyrotoxicosis, unspecified without thyrotoxic crisis or storm: Secondary | ICD-10-CM | POA: Diagnosis present

## 2018-08-08 DIAGNOSIS — Z20828 Contact with and (suspected) exposure to other viral communicable diseases: Secondary | ICD-10-CM | POA: Diagnosis present

## 2018-08-08 DIAGNOSIS — Z8249 Family history of ischemic heart disease and other diseases of the circulatory system: Secondary | ICD-10-CM

## 2018-08-08 DIAGNOSIS — Z87442 Personal history of urinary calculi: Secondary | ICD-10-CM

## 2018-08-08 DIAGNOSIS — Z90710 Acquired absence of both cervix and uterus: Secondary | ICD-10-CM

## 2018-08-08 DIAGNOSIS — K5900 Constipation, unspecified: Secondary | ICD-10-CM | POA: Diagnosis present

## 2018-08-08 DIAGNOSIS — K5730 Diverticulosis of large intestine without perforation or abscess without bleeding: Secondary | ICD-10-CM | POA: Diagnosis present

## 2018-08-08 DIAGNOSIS — D638 Anemia in other chronic diseases classified elsewhere: Secondary | ICD-10-CM | POA: Diagnosis present

## 2018-08-08 DIAGNOSIS — M1990 Unspecified osteoarthritis, unspecified site: Secondary | ICD-10-CM | POA: Diagnosis present

## 2018-08-08 DIAGNOSIS — Z9049 Acquired absence of other specified parts of digestive tract: Secondary | ICD-10-CM

## 2018-08-08 LAB — CBC WITH DIFFERENTIAL/PLATELET
Abs Immature Granulocytes: 0.03 10*3/uL (ref 0.00–0.07)
Basophils Absolute: 0 10*3/uL (ref 0.0–0.1)
Basophils Relative: 0 %
Eosinophils Absolute: 0 10*3/uL (ref 0.0–0.5)
Eosinophils Relative: 0 %
HCT: 34 % — ABNORMAL LOW (ref 36.0–46.0)
Hemoglobin: 11.3 g/dL — ABNORMAL LOW (ref 12.0–15.0)
Immature Granulocytes: 0 %
Lymphocytes Relative: 11 %
Lymphs Abs: 1 10*3/uL (ref 0.7–4.0)
MCH: 27.6 pg (ref 26.0–34.0)
MCHC: 33.2 g/dL (ref 30.0–36.0)
MCV: 83.1 fL (ref 80.0–100.0)
Monocytes Absolute: 0.7 10*3/uL (ref 0.1–1.0)
Monocytes Relative: 8 %
Neutro Abs: 7.4 10*3/uL (ref 1.7–7.7)
Neutrophils Relative %: 81 %
Platelets: 350 10*3/uL (ref 150–400)
RBC: 4.09 MIL/uL (ref 3.87–5.11)
RDW: 13.9 % (ref 11.5–15.5)
WBC: 9.2 10*3/uL (ref 4.0–10.5)
nRBC: 0 % (ref 0.0–0.2)

## 2018-08-08 LAB — COMPREHENSIVE METABOLIC PANEL
ALT: 16 U/L (ref 0–44)
AST: 21 U/L (ref 15–41)
Albumin: 3.9 g/dL (ref 3.5–5.0)
Alkaline Phosphatase: 107 U/L (ref 38–126)
Anion gap: 14 (ref 5–15)
BUN: 15 mg/dL (ref 8–23)
CO2: 25 mmol/L (ref 22–32)
Calcium: 9.9 mg/dL (ref 8.9–10.3)
Chloride: 83 mmol/L — ABNORMAL LOW (ref 98–111)
Creatinine, Ser: 0.92 mg/dL (ref 0.44–1.00)
GFR calc Af Amer: >60 mL/min (ref >60)
GFR calc non Af Amer: 55 mL/min — ABNORMAL LOW (ref >60)
Glucose, Bld: 111 mg/dL — ABNORMAL HIGH (ref 70–99)
Potassium: 4 mmol/L (ref 3.5–5.1)
Sodium: 122 mmol/L — ABNORMAL LOW (ref 135–145)
Total Bilirubin: 0.8 mg/dL (ref 0.3–1.2)
Total Protein: 6.9 g/dL (ref 6.5–8.1)

## 2018-08-08 LAB — MAGNESIUM: Magnesium: 1.5 mg/dL — ABNORMAL LOW (ref 1.7–2.4)

## 2018-08-08 LAB — PROTIME-INR
INR: 2 — ABNORMAL HIGH (ref 0.8–1.2)
Prothrombin Time: 22.1 seconds — ABNORMAL HIGH (ref 11.4–15.2)

## 2018-08-08 LAB — TROPONIN I: Troponin I: <0.03 ng/mL (ref <0.03)

## 2018-08-08 LAB — SARS CORONAVIRUS 2 BY RT PCR (HOSPITAL ORDER, PERFORMED IN ~~LOC~~ HOSPITAL LAB): SARS Coronavirus 2: NEGATIVE

## 2018-08-08 LAB — TSH: TSH: 7.778 u[IU]/mL — ABNORMAL HIGH (ref 0.350–4.500)

## 2018-08-08 MED ORDER — IOHEXOL 300 MG/ML  SOLN
100.0000 mL | Freq: Once | INTRAMUSCULAR | Status: AC | PRN
  Administered 2018-08-08: 100 mL via INTRAVENOUS

## 2018-08-08 MED ORDER — MAGNESIUM SULFATE 2 GM/50ML IV SOLN
INTRAVENOUS | Status: AC
  Administered 2018-08-08: 21:00:00 2 g
  Filled 2018-08-08: qty 50

## 2018-08-08 MED ORDER — SODIUM CHLORIDE 0.9 % IV BOLUS
1000.0000 mL | Freq: Once | INTRAVENOUS | Status: AC
  Administered 2018-08-08: 22:00:00 1000 mL via INTRAVENOUS

## 2018-08-08 NOTE — H&P (Addendum)
History and Physical    BRIANNON BOGGIO POE:[id number] DOB: Dec 05, 1925 DOA: 08/08/2018  Referring MD/NP/PA:   PCP: [name], MD   Patient coming from:  The patient is coming from home.  At baseline, pt is partially dependent for most of ADL.        Chief Complaint: Intractable nausea and vomiting  HPI: [name] is a 85 y.o. female with medical history significant of hypertension, hyperlipidemia, atrial fibrillation on Coumadin, bradycardia, pacemaker placement, hyperthyroidism, GERD, anxiety, who presents with intractable nausea and vomiting.  Patient states that she has been having severe nausea vomiting in the past 4 days.  She has nonbloody, but bilious vomiting for many times each day per patient.  She has mild abdominal discomfort, but denies abdominal pain.  No diarrhea.  She is constipated. Last bowel movement was 3 days ago.  She denies chest pain, shortness breath, cough.  No fever or chills.  Denies symptoms of UTI or unilateral weakness.  Of note, per EDP, pt was given Zofran by EMS for nausea and vomiting during transport. At arrival to ED, pt developed wide-complex ventricular rhythm, torsades versus ventricular tachycardia. Patient was quickly given 2 grams of magnesium sulfate and 1 L normal saline.  Her rhythm went back to paced rhythm. Cardiology was quickly called.    ED Course: pt was found to have WBC 9.2, negative troponin, INR 2.0, negative COVID-19 test, sodium 122, renal function normal, potassium 4.0, magnesium 1.5, no fever, currently bradycardia, oxygen saturation 96% on room air.  Chest x-ray showed scarring change without infiltration. Pending Ct-abdomen and pelvis. Pt is admitted to progressive bed as inpat.  Review of Systems:   General: no fevers, chills, no body weight gain, has poor appetite, has fatigue HEENT: no blurry vision, hearing changes or sore throat Respiratory: no dyspnea, coughing, wheezing CV: no chest pain, no palpitations  GI: has nausea, vomiting, abdominal discomfort, constipation, no diarrhea,  GU: no dysuria, burning on urination, increased urinary frequency, hematuria  Ext: has mild leg edema Neuro: no unilateral weakness, numbness, or tingling, no vision change or hearing loss Skin: no rash, no skin tear. MSK: No muscle spasm, no deformity, no limitation of range of movement in spin Heme: No easy bruising.  Travel history: No recent long distant travel.  Allergy:  Allergies  Allergen Reactions  . Morphine And Related Nausea And Vomiting    Past Medical History:  Diagnosis Date  . Anxiety   . Atrial fibrillation ([place])   . Bradycardia   . Cancer ([place])    skin  . Cataract    removed bilat with lens both eyes   . Complication of anesthesia   . Difficulty sleeping   . Dysrhythmia    A FIB  . GERD (gastroesophageal reflux disease)   . H/O hiatal hernia   . History of kidney stones   . History of skin cancer   . History of transfusion   . Hyperlipidemia   . Hypertension   . Osteoarthritis   . Pacemaker   . Pneumonia   . PONV (postoperative nausea and vomiting)   . Shortness of breath    WITH EXERTION    Past Surgical History:  Procedure Laterality Date  . APPENDECTOMY  1960  . ARTHROPLASTY  1994   Left total knee  . ARTHROPLASTY  2000   Total right knee  . BACK SURGERY    . CATARACT EXTRACTION, BILATERAL     with lens implants  . CHOLECYSTECTOMY    .  COLONOSCOPY    . DG SELECTED HSG [facility]DC ONLY  2002   Dilation  . Microdiskectomy  05/2004   Left L4-L5  . PACEMAKER INSERTION    . POLYPECTOMY    . TOTAL ABDOMINAL HYSTERECTOMY  1969  . TOTAL HIP ARTHROPLASTY Right 12/24/2013   Procedure: RIGHT TOTAL HIP ARTHROPLASTY ANTERIOR APPROACH;  Surgeon: Gearlean Alf, MD;  Location: [facility];  Service: Orthopedics;  Laterality: Right;  . UPPER GASTROINTESTINAL ENDOSCOPY      Social History:  reports that she quit smoking about 44 years ago. She has never used smokeless tobacco. She  reports that she does not drink alcohol or use drugs.  Family History:  Family History  Problem Relation Age of Onset  . Heart disease Mother   . Leukemia Father   . Colon cancer Neg Hx   . Esophageal cancer Neg Hx   . Stomach cancer Neg Hx   . Pancreatic cancer Neg Hx   . Liver disease Neg Hx   . Colon polyps Neg Hx      Prior to Admission medications   Medication Sig Start Date End Date Taking? Authorizing Provider  acetaminophen (TYLENOL) 325 MG tablet Take 650 mg by mouth every 6 (six) hours as needed (pain).    [provider]  amLODipine (NORVASC) 10 MG tablet Take 10 mg by mouth every morning.     [provider]  clonazePAM (KLONOPIN) 0.5 MG tablet Take 0.5 mg by mouth daily as needed for anxiety.  03/18/15   [provider]  DENTA 5000 PLUS 1.1 % CREA dental cream Take 1 application by mouth as directed. 06/27/18   [provider]  famotidine (PEPCID) 20 MG tablet Take 1 tablet (20 mg total) by mouth 2 (two) times daily. Take every AM and at bedtime 05/04/18   Levin Erp, PA  irbesartan (AVAPRO) 300 MG tablet Take 300 mg by mouth every morning.    [provider]  methimazole (TAPAZOLE) 5 MG tablet Take 1 tablet (5 mg total) by mouth daily. 07/20/18   Geradine Girt, DO  ondansetron (ZOFRAN) 4 MG tablet Take 1 tablet (4 mg total) by mouth every 4 (four) hours as needed for nausea or vomiting. 04/15/17   Levin Erp, PA  potassium chloride SA (K-DUR) 20 MEQ tablet Take 20 mEq by mouth 2 (two) times daily. 04/29/18   [provider]  pravastatin (PRAVACHOL) 40 MG tablet Take 40 mg by mouth every morning.     [provider]  triamterene-hydrochlorothiazide (DYAZIDE) 37.5-25 MG capsule TAKE ONE CAPSULE BY MOUTH EVERY DAY Patient taking differently: Take 1 capsule by mouth daily.  06/05/18   Evans Lance, MD  warfarin (COUMADIN) 2 MG tablet Take 1-2 tablets (2-4 mg total) by mouth See admin  instructions. Take 4 mg by mouth on MWF and 2 mg all other days 07/19/18   Geradine Girt, DO    Physical Exam: Vitals:   08/08/18 2200 08/08/18 2215 08/08/18 2230 08/09/18 0033  BP: 140/74 (!) 152/67 (!) 164/73   Pulse: (!) 59 (!) 59 (!) 59   Resp: (!) 26 20 (!) 23   Temp:    97.7 F (36.5 C)  TempSrc:    Oral  SpO2: 97% 96% 99%   Weight:      Height:       General: Not in acute distress HEENT:       Eyes: PERRL, EOMI, no scleral icterus.  ENT: No discharge from the ears and nose, no pharynx injection, no tonsillar enlargement.        Neck: No JVD, no bruit, no mass felt. Heme: No neck lymph node enlargement. Cardiac: S1/S2, RRR, No murmurs, No gallops or rubs. Respiratory: No rales, wheezing, rhonchi or rubs. GI: Soft, nondistended, nontender, no rebound pain, no organomegaly, BS present. GU: No hematuria Ext: has trace leg edema bilaterally. 2+DP/PT pulse bilaterally. Musculoskeletal: No joint deformities, No joint redness or warmth, no limitation of ROM in spin. Skin: No rashes.  Neuro: Alert, oriented X3, cranial nerves II-XII grossly intact, moves all extremities normally.  Psych: Patient is not psychotic, no suicidal or hemocidal ideation.  Labs on Admission: I have personally reviewed following labs and imaging studies  CBC: Recent Labs  Lab 08/08/18 2131  WBC 9.2  NEUTROABS 7.4  HGB 11.3*  HCT 34.0*  MCV 83.1  PLT 161   Basic Metabolic Panel: Recent Labs  Lab 08/08/18 2131 08/09/18 0114  NA 122* 119*  K 4.0 3.6  CL 83* 84*  CO2 25 23  GLUCOSE 111* 138*  BUN 15 16  CREATININE 0.92 0.89  CALCIUM 9.9 9.0  MG 1.5*  --    GFR: Estimated Creatinine Clearance: 40 mL/min (by C-G formula based on SCr of 0.89 mg/dL). Liver Function Tests: Recent Labs  Lab 08/08/18 2131  AST 21  ALT 16  ALKPHOS 107  BILITOT 0.8  PROT 6.9  ALBUMIN 3.9   No results for input(s): LIPASE, AMYLASE in the last 168 hours. No results for input(s): AMMONIA in the  last 168 hours. Coagulation Profile: Recent Labs  Lab 08/08/18 2131  INR 2.0*   Cardiac Enzymes: Recent Labs  Lab 08/08/18 2131  TROPONINI <0.03   BNP (last 3 results) No results for input(s): PROBNP in the last 8760 hours. HbA1C: No results for input(s): HGBA1C in the last 72 hours. CBG: No results for input(s): GLUCAP in the last 168 hours. Lipid Profile: No results for input(s): CHOL, HDL, LDLCALC, TRIG, CHOLHDL, LDLDIRECT in the last 72 hours. Thyroid Function Tests: Recent Labs    08/08/18 2131  TSH 7.778*   Anemia Panel: No results for input(s): VITAMINB12, FOLATE, FERRITIN, TIBC, IRON, RETICCTPCT in the last 72 hours. Urine analysis:    Component Value Date/Time   COLORURINE STRAW (A) 03/27/2018 1432   APPEARANCEUR CLEAR 03/27/2018 1432   LABSPEC 1.003 (L) 03/27/2018 1432   PHURINE 7.0 03/27/2018 1432   GLUCOSEU NEGATIVE 03/27/2018 1432   HGBUR NEGATIVE 03/27/2018 1432   BILIRUBINUR NEGATIVE 03/27/2018 1432   KETONESUR NEGATIVE 03/27/2018 1432   PROTEINUR NEGATIVE 03/27/2018 1432   UROBILINOGEN 0.2 11/12/2014 1331   NITRITE NEGATIVE 03/27/2018 1432   LEUKOCYTESUR SMALL (A) 03/27/2018 1432   Sepsis Labs: @LABRCNTIP (procalcitonin:4,lacticidven:4) ) Recent Results (from the past 240 hour(s))  SARS Coronavirus 2 (CEPHEID - Performed in [place] hospital lab), Hosp Order     Status: None   Collection Time: 08/08/18  9:38 PM  Result Value Ref Range Status   SARS Coronavirus 2 NEGATIVE NEGATIVE Final    Comment: (NOTE) If result is NEGATIVE SARS-CoV-2 target nucleic acids are NOT DETECTED. The SARS-CoV-2 RNA is generally detectable in upper and lower  respiratory specimens during the acute phase of infection. The lowest  concentration of SARS-CoV-2 viral copies this assay can detect is 250  copies / mL. A negative result does not preclude SARS-CoV-2 infection  and should not be used as the sole basis for treatment or other  patient management decisions.   A negative result may occur with  improper specimen collection / handling, submission of specimen other  than nasopharyngeal swab, presence of viral mutation(s) within the  areas targeted by this assay, and inadequate number of viral copies  (<250 copies / mL). A negative result must be combined with clinical  observations, patient history, and epidemiological information. If result is POSITIVE SARS-CoV-2 target nucleic acids are DETECTED. The SARS-CoV-2 RNA is generally detectable in upper and lower  respiratory specimens dur ing the acute phase of infection.  Positive  results are indicative of active infection with SARS-CoV-2.  Clinical  correlation with patient history and other diagnostic information is  necessary to determine patient infection status.  Positive results do  not rule out bacterial infection or co-infection with other viruses. If result is PRESUMPTIVE POSTIVE SARS-CoV-2 nucleic acids MAY BE PRESENT.   A presumptive positive result was obtained on the submitted specimen  and confirmed on repeat testing.  While 2019 novel coronavirus  (SARS-CoV-2) nucleic acids may be present in the submitted sample  additional confirmatory testing may be necessary for epidemiological  and / or clinical management purposes  to differentiate between  SARS-CoV-2 and other Sarbecovirus currently known to infect humans.  If clinically indicated additional testing with an alternate test  methodology [id number]) is advised. The SARS-CoV-2 RNA is generally  detectable in upper and lower respiratory sp ecimens during the acute  phase of infection. The expected result is Negative. Fact Sheet for Patients:  StrictlyIdeas.no Fact Sheet for Healthcare Providers: BankingDealers.co.za This test is not yet approved or cleared by the [place] FDA and has been authorized for detection and/or diagnosis of SARS-CoV-2 by FDA under an Emergency Use  Authorization (EUA).  This EUA will remain in effect (meaning this test can be used) for the duration of the COVID-19 declaration under Section 564(b)(1) of the Act, 21 U.S.C. section 360bbb-3(b)(1), unless the authorization is terminated or revoked sooner. Performed at [facility], [place] [address], [place], [place] [address]      Radiological Exams on Admission: Dg Chest Portable 1 View  Result Date: 08/08/2018 CLINICAL DATA:  Tachycardia EXAM: PORTABLE CHEST 1 VIEW COMPARISON:  July 18, 2018 FINDINGS: There is scarring and atelectasis in the left base. Lungs elsewhere are clear. Heart is mildly enlarged with pulmonary vascularity. Pacemaker lead is attached to the right ventricle. There is aortic atherosclerosis. No adenopathy. There is degenerative change in each shoulder. IMPRESSION: Scarring and atelectasis in left lung base. Lungs elsewhere clear. Stable cardiac prominence. Stable pacemaker lead positioning. Aortic Atherosclerosis (ICD10-I70.0). Electronically Signed   By: Lowella Grip III M.D.   On: 08/08/2018 21:47     EKG: Independently reviewed.  EKG showed paced rhythm.   Assessment/Plan Principal Problem:   V tach ([place]) Active Problems:   Essential hypertension   ATRIAL FIBRILLATION   GERD   Cardiac pacemaker in situ   Chronic anticoagulation   Anxiety   Intractable nausea and vomiting   Hyponatremia   Hypomagnesemia   Hyperthyroidism   Possible V tach ([place]): pt had wide complex tachycardia.  Pt had Mg 1.5, K 4.0 and Na 122. Dr. Elson Areas of card was consulted-->likely related to electrolytes disturbance.  He recommended telemetry monitoring and keep K>4, Mg >2.0. No CP or SOB.  -will admit to progressive bed as inpatient -correct hypomagnesemia, will give total of 3 g of magnesium sulfate -will give 10 mEq x 2 of KCl  -check trop x 3 per  Dr. Elson Areas -check TSH  Intractable nausea and vomiting: Etiology is not clear.  Patient denies abdominal pain.  CT-abdomen/pelvis report is pending, but does not seem to have bowel obstruction.  May be due to constipation. -hold all oral meds since pt cannot tolerated any oral meds now -IV pepcid 20 mg bid -reglan 5 mg tid -IVF: 1L NS in ED-->need to f/u BMP for Na level, then decide further IVF -f/u CT-scan report -check lipase -may need to give enema if CT-scan negative -start NG tube  Hyponatremia: Na 122.  Likely multifactorial etiology, including GI loss, continuation of ARB (irbesartan) and Dyazide.  Mental status normal. - Irbesartan and Dyazide on hold -Check plasma and urine osmolality, TSH -1L NS was given in ED -BMP q6h  Essential hypertension: -IV hydralazine as needed  ATRIAL FIBRILLATION: CHA2DS2-VASc Score is 4, needs oral anticoagulation. Patient is on Coumadin at home. INR is 2.0 on admission. Pt has bradycardia. S/p of pacemaker. EDP requested pacemaker interrogation, but it cannot be done until tomorrow morning. -hold coumadin due to severe intractable nausea vomiting -Telemetry monitor  GERD: -IV pepcid  Anxiety: -iv prn ativan, 0.25 prn q8h  Hypomagnesemia: Mg 1.5 -repleted.  Hyperthyroidism: TSH 7.778 today. Pt is taking methimazole at home. -May need to decrease methimazole dose.  Currently it is on hold due to severe nausea and vomiting.    Inpatient status:  # Patient requires inpatient status due to high intensity of service, high risk for further deterioration and high frequency of surveillance required.  I certify that at the point of admission it is my clinical judgment that the patient will require inpatient hospital care spanning beyond 2 midnights from the point of admission.  . This patient has multiple chronic comorbidities including hypertension, hyperlipidemia, atrial fibrillation on Coumadin, bradycardia, pacemaker placement, hypothyroidism, GERD, anxiety. .  . Now patient has presenting with intractable nausea vomiting, profound electrolytes  disturbance including hyponatremia and hypomagnesemia.  Patient also developed V. tach. . The worrisome physical exam findings include intractable nausea vomiting, has mild leg edema. . The initial radiographic and laboratory data are worrisome because of profound electrolytes disturbance . Current medical needs: please see my assessment and plan . Predictability of an adverse outcome (risk): Patient has multiple comorbidities, now presents with intractable nausea ,vomiting, profound electrolyte disturbance.  Developed V. tach.  Her presentation is very complicated.  Given her old age, patient is to high risk of deteriorating.  Will need to be treated in hospital for at least 2 days.      DVT ppx: SCD Code Status: Full code (pt recently had DNR. Today I have discussed with with patient about CODE STATUS, and explained the meaning of CODE STATUS.  She is not very sure what she wants, and cannot make a decision at this moment.  Will temporarily put full code now.  This will need to be discussed further with patient and family in AM). Family Communication: None at bed side.   Disposition Plan:  Anticipate discharge back to previous home environment Consults called:  Card, Dr. Elson Areas Admission status: SDU/inpation       Date of Service 08/09/2018    [facility]   If 7PM-7AM, please contact night-coverage [email or web address] Password [email or web address] 08/09/2018, 1:52 AM

## 2018-08-08 NOTE — Consult Note (Addendum)
CARDIOLOGY CONSULT NOTE   Patient ID: [name] MRN: [id number], DOB/AGE: [birth year]   Admit date: 08/08/2018 Date of Consult: 08/08/2018  Primary Physician: [name], MD Primary Cardiologist: Cristopher Peru  Consulting Physician: Dr. Sherry Ruffing Reason for Consult: arrhythmia   Pt. Profile 85 yo female w/ SSS (w/ single lead [employer] PM), permanent atrial fibrillation on warfarin, HTN, HLD who presents with nausea/vomiting, found to have one episode of wide complex tachycardia.  Problem List  Past Medical History:  Diagnosis Date  . Anxiety   . Atrial fibrillation ([place])   . Bradycardia   . Cancer ([place])    skin  . Cataract    removed bilat with lens both eyes   . Complication of anesthesia   . Difficulty sleeping   . Dysrhythmia    A FIB  . GERD (gastroesophageal reflux disease)   . H/O hiatal hernia   . History of kidney stones   . History of skin cancer   . History of transfusion   . Hyperlipidemia   . Hypertension   . Osteoarthritis   . Pacemaker   . Pneumonia   . PONV (postoperative nausea and vomiting)   . Shortness of breath    WITH EXERTION    Past Surgical History:  Procedure Laterality Date  . APPENDECTOMY  1960  . ARTHROPLASTY  1994   Left total knee  . ARTHROPLASTY  2000   Total right knee  . BACK SURGERY    . CATARACT EXTRACTION, BILATERAL     with lens implants  . CHOLECYSTECTOMY    . COLONOSCOPY    . DG SELECTED HSG [facility] ONLY  2002   Dilation  . Microdiskectomy  05/2004   Left L4-L5  . PACEMAKER INSERTION    . POLYPECTOMY    . TOTAL ABDOMINAL HYSTERECTOMY  1969  . TOTAL HIP ARTHROPLASTY Right 12/24/2013   Procedure: RIGHT TOTAL HIP ARTHROPLASTY ANTERIOR APPROACH;  Surgeon: Gearlean Alf, MD;  Location: [facility];  Service: Orthopedics;  Laterality: Right;  . UPPER GASTROINTESTINAL ENDOSCOPY       Allergies  Allergies  Allergen Reactions  . Morphine And Related Nausea And Vomiting    HPI  [name] is an active 85 year  old who lives alone next door to her son. She stays active, however given the COVID-pandemic has been spending more time at home. Noticed some more congestions over the last couple of weeks, but no fevers/chills or known sick contacts. No cough.   However she reports that at least today and reports in chart state that she has had 4 days of nausea and vomiting. Difficulty keeping food down given nausea. Had not had BM in 4 days, but passing gas. No abdominal pain. Also noticed some vertigo with nausea. No SOB or CP. No syncope or pre-syncope. In the ED, we was noted to be in a paced rhythm when she developed a wide complex tachycardia for ~1 minute. She reportedly appeared pale and diaphoretic at that time. Resolved on own. Magnesium was given and she had no more of these episodes in the ED.   No new PND or orthopnea. No CP. Last echo 06/2018 w/ normal EF and no WMA. NM spect in 2017 w/o evidence of ischemia. PM in 2019 functioning with rate 60-120 in [facility].   Inpatient Medications    Family History Family History  Problem Relation Age of Onset  . Heart disease Mother   . Leukemia Father   . Colon cancer  Neg Hx   . Esophageal cancer Neg Hx   . Stomach cancer Neg Hx   . Pancreatic cancer Neg Hx   . Liver disease Neg Hx   . Colon polyps Neg Hx      Social History Social History   Socioeconomic History  . Marital status: Widowed    Spouse name: Not on file  . Number of children: 2  . Years of education: Not on file  . Highest education level: Not on file  Occupational History  . Occupation: retired  Scientific laboratory technician  . Financial resource strain: Not on file  . Food insecurity:    Worry: Not on file    Inability: Not on file  . Transportation needs:    Medical: Not on file    Non-medical: Not on file  Tobacco Use  . Smoking status: Former Smoker    Last attempt to quit: 12/18/1973    Years since quitting: 44.6  . Smokeless tobacco: Never Used  . Tobacco comment: Smoker for 25 years  and quit at around age 57  Substance and Sexual Activity  . Alcohol use: No  . Drug use: No  . Sexual activity: Not Currently  Lifestyle  . Physical activity:    Days per week: Not on file    Minutes per session: Not on file  . Stress: Not on file  Relationships  . Social connections:    Talks on phone: Not on file    Gets together: Not on file    Attends religious service: Not on file    Active member of club or organization: Not on file    Attends meetings of clubs or organizations: Not on file    Relationship status: Not on file  . Intimate partner violence:    Fear of current or ex partner: Not on file    Emotionally abused: Not on file    Physically abused: Not on file    Forced sexual activity: Not on file  Other Topics Concern  . Not on file  Social History Narrative   Married, husband's name is Warner Mccreedy, they have been married since 1950   2 sons and 4 grandchildren           Review of Systems  General:  No chills, fever, night sweats or weight changes.  Cardiovascular:  No chest pain, dyspnea on exertion, edema, orthopnea, palpitations, paroxysmal nocturnal dyspnea. Dermatological: No rash, lesions/masses Respiratory: No cough, dyspnea Urologic: No hematuria, dysuria Abdominal:   No nausea, vomiting, diarrhea, bright red blood per rectum, melena, or hematemesis Neurologic:  No visual changes, wkns, changes in mental status. All other systems reviewed and are otherwise negative except as noted above.  Physical Exam  Blood pressure (!) 164/73, pulse (!) 59, resp. rate (!) 23, height 5' 3.5" (1.613 m), weight 70.3 kg, SpO2 99 %.  General: Pleasant, NAD Psych: Normal affect. Neuro: Alert and oriented X 3. Moves all extremities spontaneously. HEENT: Normal  Neck: Supple without bruits or JVD. Lungs:  Some wheezes bilaterally Heart: brady, no s3, s4, or murmurs. Abdomen: Soft, non-tender, non-distended, hypoactive BS.  Extremities: No clubbing, cyanosis or edema.  DP/PT/Radials 2+ and equal bilaterally.  Labs  Recent Labs    08/08/18 2131  TROPONINI <0.03   Kim Results  Component Value Date   WBC 9.2 08/08/2018   HGB 11.3 (L) 08/08/2018   HCT 34.0 (L) 08/08/2018   MCV 83.1 08/08/2018   PLT 350 08/08/2018    Recent Labs  Kim  08/08/18 2131  NA 122*  K 4.0  CL 83*  CO2 25  BUN 15  CREATININE 0.92  CALCIUM 9.9  PROT 6.9  BILITOT 0.8  ALKPHOS 107  ALT 16  AST 21  GLUCOSE 111*   Kim Results  Component Value Date   CHOL 177 07/19/2018   HDL 76 07/19/2018   LDLCALC 89 07/19/2018   TRIG 60 07/19/2018   No results found for: DDIMER  Radiology/Studies  Dg Chest 2 View  Result Date: 07/18/2018 CLINICAL DATA:  Chest pressure and discomfort. History of atrial fibrillation EXAM: CHEST - 2 VIEW COMPARISON:  Chest radiograph June 11, 2016; chest CT June 12, 2016 FINDINGS: There is scarring in the left base. The lungs elsewhere are clear. Heart is upper normal in size with pulmonary vascularity normal. Pacemaker lead is attached to the right ventricle. No adenopathy. There is aortic atherosclerosis. There is degenerative change in each shoulder. IMPRESSION: Scarring left base. No edema or consolidation. Stable cardiac silhouette. Pacemaker lead attached to right ventricle. Aortic Atherosclerosis (ICD10-I70.0). Electronically Signed   By: Lowella Grip III M.D.   On: 07/18/2018 13:50   Dg Chest Portable 1 View  Result Date: 08/08/2018 CLINICAL DATA:  Tachycardia EXAM: PORTABLE CHEST 1 VIEW COMPARISON:  July 18, 2018 FINDINGS: There is scarring and atelectasis in the left base. Lungs elsewhere are clear. Heart is mildly enlarged with pulmonary vascularity. Pacemaker lead is attached to the right ventricle. There is aortic atherosclerosis. No adenopathy. There is degenerative change in each shoulder. IMPRESSION: Scarring and atelectasis in left lung base. Lungs elsewhere clear. Stable cardiac prominence. Stable pacemaker lead positioning.  Aortic Atherosclerosis (ICD10-I70.0). Electronically Signed   By: Lowella Grip III M.D.   On: 08/08/2018 21:47    ECG V- paced in 50s  Telemetry of event in ED:      ASSESSMENT AND PLAN 85 yo female w/ SSS (w/ single lead St Jude PM), permanent atrial fibrillation on warfarin, HTN, HLD who presents with nausea/vomiting, found to have one episode of wide complex tachycardia.  # Wide complex tachycardia: polymorphic and monomorphic pattern. No h/o CAD. Trop negative. Mg 1.5, K 4.0. QTc ~450, paced. With nausea/vomiting and no BMs for 4 days, possibly electrolyte related. Unlikely SVT with aberrancy (no h/o LBBB or rate related bundle).  - agree with admission to work-up nausea/vomiting, electrolyte disturbances - telemetry and K>4, Mg >2.0. Would add BNP and repeat troponin to work-up.  - if more episodes of WCT, will need further work-up with possible functional cMRI (assess also if scar burden) vs repeat NM spect. Would then consider switch from Amlodipine to Metoprolol or other BB. Known hyperthyroidism therefore amiodarone not good option.   # Hyponatremia: possibly 2/2 pain and hypovolemia. Euvolemic/hypovolemic on exam. s osm 276, Una 45 in 06/2018. Less likely but possible siadh/other causes - pending further work-up by inpatient team  Consult service will see her again tomorrow and provide further follow-up. Please page with any questions.   Signed, Charlies Silvers, MD

## 2018-08-08 NOTE — ED Notes (Signed)
At approx 2105 pt noted to be in wide complex ventricular rhythm. This RN and Bennie Hind RN immediately to bedside to assess pt. Pt appears pale, diaphoretic and vomiting. Pt w/ pulse and responsive at this time. Pt stating she "feels terrible". MD Tegeler alerted and to bedside to assess. Pt placed on Zoll monitor, pads in place. 12 lead obtained and given to MD. Rhythm appeared to be Vtach, self limiting, stopped w/out intervention. Verbal order for Mag from MD Tegeler see MAR for admin. Pt remains on cardiac monitor, Zoll in place. Pt GCS 15 A&Ox4. Will closely monitor pt's tele at this time.

## 2018-08-08 NOTE — ED Triage Notes (Signed)
Pt came in with c/o nausea and vomiting x4 days.  Denies any pain at this time.  Per PCP, pt NA was low

## 2018-08-08 NOTE — ED Notes (Signed)
Attempted to interrogate [facility] Pacemaker, however pt has older monitor and is not compatible w/ our interrogator. [employer] rep called and stated that d/t Covid they are not able to come interrogate. MD Tegeler made aware, as well as cardiologist. Will await further instruction.

## 2018-08-08 NOTE — ED Provider Notes (Signed)
[facility] EMERGENCY DEPARTMENT Provider Note   CSN: [id number] Arrival date & time: 08/08/18  2040    History   Chief Complaint Chief Complaint  Patient presents with   Nausea   Emesis    HPI [name] is a 85 y.o. female.     The history is provided by the patient and medical records. No language interpreter was used.  Abdominal Pain  Pain location:  Epigastric Pain quality: aching and cramping   Pain radiates to:  Does not radiate Pain severity:  Moderate Onset quality:  Gradual Duration:  1 day Timing:  Constant Progression:  Unchanged Chronicity:  New Relieved by:  Nothing Worsened by:  Nothing Ineffective treatments:  None tried Associated symptoms: constipation, fatigue, nausea and vomiting   Associated symptoms: no chest pain, no chills, no cough, no diarrhea, no dysuria, no fever and no shortness of breath   Risk factors: being elderly     Past Medical History:  Diagnosis Date   Anxiety    Atrial fibrillation (HCC)    Bradycardia    Cancer ([place])    skin   Cataract    removed bilat with lens both eyes    Complication of anesthesia    Difficulty sleeping    Dysrhythmia    A FIB   GERD (gastroesophageal reflux disease)    H/O hiatal hernia    History of kidney stones    History of skin cancer    History of transfusion    Hyperlipidemia    Hypertension    Osteoarthritis    Pacemaker    Pneumonia    PONV (postoperative nausea and vomiting)    Shortness of breath    WITH EXERTION    Patient Active Problem List   Diagnosis Date Noted   Nonspecific chest pain 07/18/2018   Anxiety 07/18/2018   Insomnia 62/26/3335   Complication of anesthesia    Intractable headache 01/06/2017   Intractable pain 01/04/2017   Chronic anticoagulation 01/03/2017   Neck pain 01/03/2017   Acute bronchitis 06/08/2016   Fall 09/24/2015   Dizziness 09/24/2015   Postoperative anemia due to acute blood  loss 12/27/2013   Postop Transfusion 12/27/2013   OA (osteoarthritis) of hip 12/24/2013   Preop cardiovascular exam 09/25/2013   Cardiac pacemaker in situ 08/29/2008   Essential hypertension 08/21/2008   ATRIAL FIBRILLATION 08/21/2008   Bradycardia 08/21/2008   PNEUMONIA 08/21/2008   GERD 08/21/2008   NEPHROLITHIASIS 08/21/2008   Osteoarthritis 08/21/2008    Past Surgical History:  Procedure Laterality Date   [place]   Left total knee   ARTHROPLASTY  2000   Total right knee   BACK SURGERY     CATARACT EXTRACTION, BILATERAL     with lens implants   CHOLECYSTECTOMY     COLONOSCOPY     DG SELECTED HSG GDC ONLY  2002   Dilation   Microdiskectomy  05/2004   Left L4-L5   PACEMAKER INSERTION     POLYPECTOMY     TOTAL ABDOMINAL HYSTERECTOMY  1969   TOTAL HIP ARTHROPLASTY Right 12/24/2013   Procedure: RIGHT TOTAL HIP ARTHROPLASTY ANTERIOR APPROACH;  Surgeon: Gearlean Alf, MD;  Location: [facility];  Service: Orthopedics;  Laterality: Right;   UPPER GASTROINTESTINAL ENDOSCOPY       OB History   No obstetric history on file.      Home Medications    Prior to Admission medications   Medication Sig Start  Date End Date Taking? Authorizing Provider  acetaminophen (TYLENOL) 325 MG tablet Take 650 mg by mouth every 6 (six) hours as needed (pain).    [provider]  amLODipine (NORVASC) 10 MG tablet Take 10 mg by mouth every morning.     [provider]  clonazePAM (KLONOPIN) 0.5 MG tablet Take 0.5 mg by mouth daily as needed for anxiety.  03/18/15   [provider]  DENTA 5000 PLUS 1.1 % CREA dental cream Take 1 application by mouth as directed. 06/27/18   [provider]  famotidine (PEPCID) 20 MG tablet Take 1 tablet (20 mg total) by mouth 2 (two) times daily. Take every AM and at bedtime 05/04/18   Levin Erp, PA  irbesartan (AVAPRO) 300 MG tablet Take 300 mg by mouth every  morning.    [provider]  methimazole (TAPAZOLE) 5 MG tablet Take 1 tablet (5 mg total) by mouth daily. 07/20/18   Geradine Girt, DO  ondansetron (ZOFRAN) 4 MG tablet Take 1 tablet (4 mg total) by mouth every 4 (four) hours as needed for nausea or vomiting. 04/15/17   Levin Erp, PA  potassium chloride SA (K-DUR) 20 MEQ tablet Take 20 mEq by mouth 2 (two) times daily. 04/29/18   [provider]  pravastatin (PRAVACHOL) 40 MG tablet Take 40 mg by mouth every morning.     [provider]  triamterene-hydrochlorothiazide (DYAZIDE) 37.5-25 MG capsule TAKE ONE CAPSULE BY MOUTH EVERY DAY Patient taking differently: Take 1 capsule by mouth daily.  06/05/18   Evans Lance, MD  warfarin (COUMADIN) 2 MG tablet Take 1-2 tablets (2-4 mg total) by mouth See admin instructions. Take 4 mg by mouth on MWF and 2 mg all other days 07/19/18   Geradine Girt, DO    Family History Family History  Problem Relation Age of Onset   Heart disease Mother    Leukemia Father    Colon cancer Neg Hx    Esophageal cancer Neg Hx    Stomach cancer Neg Hx    Pancreatic cancer Neg Hx    Liver disease Neg Hx    Colon polyps Neg Hx     Social History Social History   Tobacco Use   Smoking status: Former Smoker    Last attempt to quit: 12/18/1973    Years since quitting: 44.6   Smokeless tobacco: Never Used   Tobacco comment: Smoker for 25 years and quit at around age 21  Substance Use Topics   Alcohol use: No   Drug use: No     Allergies   Morphine and related   Review of Systems Review of Systems  Constitutional: Positive for fatigue. Negative for chills, diaphoresis and fever.  HENT: Negative for congestion.   Eyes: Negative for visual disturbance.  Respiratory: Negative for cough, chest tightness, shortness of breath, wheezing and stridor.   Cardiovascular: Negative for chest pain, palpitations and leg swelling.  Gastrointestinal: Positive for  abdominal pain, constipation, nausea and vomiting. Negative for diarrhea.  Genitourinary: Negative for dysuria and flank pain.  Musculoskeletal: Negative for back pain, neck pain and neck stiffness.  Skin: Negative for rash and wound.  Neurological: Positive for light-headedness. Negative for dizziness and headaches.  Psychiatric/Behavioral: Negative for agitation.  All other systems reviewed and are negative.    Physical Exam Updated Vital Signs BP (!) 164/73    Pulse (!) 59    Resp (!) 23    Ht 5' 3.5" (1.613 m)  Wt 70.3 kg    SpO2 99%    BMI 27.03 kg/m   Physical Exam Vitals signs and nursing note reviewed.  Constitutional:      General: She is not in acute distress.    Appearance: She is well-developed. She is not ill-appearing, toxic-appearing or diaphoretic.  HENT:     Head: Normocephalic and atraumatic.     Nose: No congestion or rhinorrhea.     Mouth/Throat:     Pharynx: No oropharyngeal exudate or posterior oropharyngeal erythema.  Eyes:     Conjunctiva/sclera: Conjunctivae normal.     Pupils: Pupils are equal, round, and reactive to light.  Neck:     Musculoskeletal: Neck supple.  Cardiovascular:     Rate and Rhythm: Regular rhythm. Tachycardia present.     Pulses: Normal pulses.     Heart sounds: No murmur.  Pulmonary:     Effort: Pulmonary effort is normal. No respiratory distress.     Breath sounds: Rhonchi and rales present. No wheezing.  Chest:     Chest wall: No tenderness.  Abdominal:     General: Abdomen is flat.     Palpations: Abdomen is soft.     Tenderness: There is no abdominal tenderness. There is no right CVA tenderness or left CVA tenderness.  Skin:    General: Skin is warm and dry.     Capillary Refill: Capillary refill takes less than 2 seconds.  Neurological:     General: No focal deficit present.     Mental Status: She is alert.  Psychiatric:        Mood and Affect: Mood normal.      ED Treatments / Results  Labs (all labs  ordered are listed, but only abnormal results are displayed) Labs Reviewed  CBC WITH DIFFERENTIAL/PLATELET - Abnormal; Notable for the following components:      Result Value   Hemoglobin 11.3 (*)    HCT 34.0 (*)    All other components within normal limits  COMPREHENSIVE METABOLIC PANEL - Abnormal; Notable for the following components:   Sodium 122 (*)    Chloride 83 (*)    Glucose, Bld 111 (*)    GFR calc non Af Amer 55 (*)    All other components within normal limits  MAGNESIUM - Abnormal; Notable for the following components:   Magnesium 1.5 (*)    All other components within normal limits  PROTIME-INR - Abnormal; Notable for the following components:   Prothrombin Time 22.1 (*)    INR 2.0 (*)    All other components within normal limits  TSH - Abnormal; Notable for the following components:   TSH 7.778 (*)    All other components within normal limits  SARS CORONAVIRUS 2 (HOSPITAL ORDER, [place] [facility])  URINE CULTURE  TROPONIN I  URINALYSIS, ROUTINE W REFLEX MICROSCOPIC    EKG None  ED ECG REPORT   Date: 08/08/2018  Rate: 60  Rhythm: PAced rhythm  QRS Axis: normal  Intervals: paced  ST/T Wave abnormalities: normal  Conduction Disutrbances:paced  Narrative Interpretation:   Old EKG Reviewed: unchanged  I have personally reviewed the EKG tracing and agree with the computerized printout as noted.   Radiology Dg Chest Portable 1 View  Result Date: 08/08/2018 CLINICAL DATA:  Tachycardia EXAM: PORTABLE CHEST 1 VIEW COMPARISON:  July 18, 2018 FINDINGS: There is scarring and atelectasis in the left base. Lungs elsewhere are clear. Heart is mildly enlarged with pulmonary vascularity. Pacemaker lead is  attached to the right ventricle. There is aortic atherosclerosis. No adenopathy. There is degenerative change in each shoulder. IMPRESSION: Scarring and atelectasis in left lung base. Lungs elsewhere clear. Stable cardiac prominence. Stable pacemaker  lead positioning. Aortic Atherosclerosis (ICD10-I70.0). Electronically Signed   By: Lowella Grip III M.D.   On: 08/08/2018 21:47    Procedures Procedures (including critical care time)  CRITICAL CARE Performed by: Gwenyth Allegra Purnell Daigle Total critical care time: 60 minutes Critical care time was exclusive of separately billable procedures and treating other patients. Critical care was necessary to treat or prevent imminent or life-threatening deterioration. Critical care was time spent personally by me on the following activities: development of treatment plan with patient and/or surrogate as well as nursing, discussions with consultants, evaluation of patient's response to treatment, examination of patient, obtaining history from patient or surrogate, ordering and performing treatments and interventions, ordering and review of laboratory studies, ordering and review of radiographic studies, pulse oximetry and re-evaluation of patient's condition.   [name] was evaluated in Emergency Department on 08/08/2018 for the symptoms described in the history of present illness. She was evaluated in the context of the global COVID-19 pandemic, which necessitated consideration that the patient might be at risk for infection with the SARS-CoV-2 virus that causes COVID-19. Institutional protocols and algorithms that pertain to the evaluation of patients at risk for COVID-19 are in a state of rapid change based on information released by regulatory bodies including the CDC and federal and state organizations. These policies and algorithms were followed during the patient's care in the ED.     Medications Ordered in ED Medications  magnesium sulfate 2 GM/50ML IVPB (  Stopped 08/08/18 2145)  sodium chloride 0.9 % bolus 1,000 mL (1,000 mLs Intravenous New Bag/Given 08/08/18 2140)     Initial Impression / Assessment and Plan / ED Course  I have reviewed the triage vital signs and the nursing  notes.  Pertinent labs & imaging results that were available during my care of the patient were reviewed by me and considered in my medical decision making (see chart for details).        [name] is a 85 y.o. female with a past medical history significant for hypertension, atrial fibrillation on Coumadin therapy, [employer] pacemaker dependent, kidney stones, and anxiety who presents with nausea, vomiting, epigastric discomfort, constipation, and sensation of abdominal distention.  According to patient, she began having some nausea symptoms last night.  She reports she has had episodes of vomiting.  She denies any blood in her emesis.  She reports that she had a normal bowel movement yesterday but has had constipation today.  She feels that her abdomen is having some discomfort in her epigastrium and she feels that it is distended.  She denies any history of bowel obstructions in the past.  According to nursing report, patient was given Zofran by EMS for nausea and vomiting during transport.  I was called quickly into the exam room because patient was in an abnormal rhythm.  Telemetry review shows that it appears patient is in either torsades versus ventricular tachycardia.  Patient was quickly given magnesium and fluids.  Her rhythm went back to a paced rhythm.  Cardiology was quickly called.    On my exam, patient does have some coarse breath sounds with crackles.  Her abdomen is nontender but she feels it is more distended.  Patient has mild edema in legs.  Patient is alert and oriented and feeling  nauseous.  Patient had returned to what appears to be a paced rhythm.  Patient will have screening labs and will also have a CT scan to look for bowel obstruction given the nausea, vomiting, epigastric pain, and abdominal distention.  Cardiology came to the bedside and suspect that patient was in a V. tach rhythm versus torsades.  Unfortunately, patient's pacemaker is a English as a second language teacher  that is older and is a Chief Strategy Officer.  It does not have the capability to defibrillate.  We were also informed that the [facility] pacemaker is unable to be in irrigated by our equipment at this time and the representative will not come to the emergency department tonight.  Due to the patient's arrhythmia instability, patient will require admission.  Patient will still need the CT scan to rule out bowel obstruction however I suspect the epigastric symptoms nausea and vomiting are due to her runs of likely ventricular tachycardia.  Patient's laboratory testing began to return with a normal potassium but she does have hyponatremia.  Coronavirus test negative.  After getting the 2 g of magnesium, magnesium was 1.5 and low.  TSH similarly elevated to prior.  Troponin negative.  Chest x-ray similar to prior.  Patient will be called for admission.    Final Clinical Impressions(s) / ED Diagnoses   Final diagnoses:  Nausea  Nausea and vomiting, intractability of vomiting not specified, unspecified vomiting type  Epigastric pain  V tach (HCC)     Clinical Impression: 1. Nausea   2. Nausea and vomiting, intractability of vomiting not specified, unspecified vomiting type   3. Epigastric pain   4. V tach ([place])     Disposition: Admit  This note was prepared with assistance of Dragon voice recognition software. Occasional wrong-word or sound-a-like substitutions may have occurred due to the inherent limitations of voice recognition software.      Kiyoshi Schaab, Gwenyth Allegra, MD 08/09/18 [phone]

## 2018-08-09 DIAGNOSIS — I4821 Permanent atrial fibrillation: Secondary | ICD-10-CM | POA: Diagnosis present

## 2018-08-09 DIAGNOSIS — I472 Ventricular tachycardia, unspecified: Secondary | ICD-10-CM

## 2018-08-09 DIAGNOSIS — R112 Nausea with vomiting, unspecified: Secondary | ICD-10-CM

## 2018-08-09 DIAGNOSIS — M3219 Other organ or system involvement in systemic lupus erythematosus: Secondary | ICD-10-CM | POA: Diagnosis present

## 2018-08-09 DIAGNOSIS — M1990 Unspecified osteoarthritis, unspecified site: Secondary | ICD-10-CM | POA: Diagnosis present

## 2018-08-09 DIAGNOSIS — E0590 Thyrotoxicosis, unspecified without thyrotoxic crisis or storm: Secondary | ICD-10-CM | POA: Diagnosis present

## 2018-08-09 DIAGNOSIS — E871 Hypo-osmolality and hyponatremia: Secondary | ICD-10-CM | POA: Diagnosis not present

## 2018-08-09 DIAGNOSIS — Z20828 Contact with and (suspected) exposure to other viral communicable diseases: Secondary | ICD-10-CM | POA: Diagnosis present

## 2018-08-09 DIAGNOSIS — D638 Anemia in other chronic diseases classified elsewhere: Secondary | ICD-10-CM | POA: Diagnosis present

## 2018-08-09 DIAGNOSIS — E876 Hypokalemia: Secondary | ICD-10-CM | POA: Diagnosis present

## 2018-08-09 DIAGNOSIS — I495 Sick sinus syndrome: Secondary | ICD-10-CM | POA: Diagnosis present

## 2018-08-09 DIAGNOSIS — Z950 Presence of cardiac pacemaker: Secondary | ICD-10-CM

## 2018-08-09 DIAGNOSIS — R110 Nausea: Secondary | ICD-10-CM | POA: Diagnosis not present

## 2018-08-09 DIAGNOSIS — K5730 Diverticulosis of large intestine without perforation or abscess without bleeding: Secondary | ICD-10-CM | POA: Diagnosis present

## 2018-08-09 DIAGNOSIS — F419 Anxiety disorder, unspecified: Secondary | ICD-10-CM | POA: Diagnosis not present

## 2018-08-09 DIAGNOSIS — E861 Hypovolemia: Secondary | ICD-10-CM | POA: Diagnosis present

## 2018-08-09 DIAGNOSIS — R1013 Epigastric pain: Secondary | ICD-10-CM | POA: Insufficient documentation

## 2018-08-09 DIAGNOSIS — Z885 Allergy status to narcotic agent status: Secondary | ICD-10-CM | POA: Diagnosis not present

## 2018-08-09 DIAGNOSIS — K219 Gastro-esophageal reflux disease without esophagitis: Secondary | ICD-10-CM | POA: Diagnosis not present

## 2018-08-09 DIAGNOSIS — K5900 Constipation, unspecified: Secondary | ICD-10-CM | POA: Diagnosis present

## 2018-08-09 DIAGNOSIS — E785 Hyperlipidemia, unspecified: Secondary | ICD-10-CM | POA: Diagnosis present

## 2018-08-09 DIAGNOSIS — M1480 Arthropathies in other specified diseases classified elsewhere, unspecified site: Secondary | ICD-10-CM | POA: Diagnosis present

## 2018-08-09 DIAGNOSIS — I10 Essential (primary) hypertension: Secondary | ICD-10-CM | POA: Diagnosis present

## 2018-08-09 DIAGNOSIS — Z85828 Personal history of other malignant neoplasm of skin: Secondary | ICD-10-CM | POA: Diagnosis not present

## 2018-08-09 DIAGNOSIS — N281 Cyst of kidney, acquired: Secondary | ICD-10-CM | POA: Diagnosis present

## 2018-08-09 DIAGNOSIS — Z7901 Long term (current) use of anticoagulants: Secondary | ICD-10-CM

## 2018-08-09 DIAGNOSIS — E222 Syndrome of inappropriate secretion of antidiuretic hormone: Secondary | ICD-10-CM | POA: Diagnosis present

## 2018-08-09 DIAGNOSIS — M160 Bilateral primary osteoarthritis of hip: Secondary | ICD-10-CM | POA: Diagnosis not present

## 2018-08-09 DIAGNOSIS — Z87442 Personal history of urinary calculi: Secondary | ICD-10-CM | POA: Diagnosis not present

## 2018-08-09 LAB — OSMOLALITY
Osmolality: 251 mOsm/kg — ABNORMAL LOW (ref 275–295)
Osmolality: 256 mOsm/kg — ABNORMAL LOW (ref 275–295)

## 2018-08-09 LAB — URINALYSIS, ROUTINE W REFLEX MICROSCOPIC
Bilirubin Urine: NEGATIVE
Glucose, UA: 100 mg/dL — AB
Ketones, ur: NEGATIVE mg/dL
Leukocytes,Ua: NEGATIVE
Nitrite: NEGATIVE
Protein, ur: NEGATIVE mg/dL
Specific Gravity, Urine: 1.01 (ref 1.005–1.030)
pH: 5.5 (ref 5.0–8.0)

## 2018-08-09 LAB — URINALYSIS, MICROSCOPIC (REFLEX)

## 2018-08-09 LAB — BASIC METABOLIC PANEL
Anion gap: 12 (ref 5–15)
Anion gap: 10 (ref 5–15)
BUN: 16 mg/dL (ref 8–23)
BUN: 15 mg/dL (ref 8–23)
CO2: 23 mmol/L (ref 22–32)
CO2: 23 mmol/L (ref 22–32)
Calcium: 9 mg/dL (ref 8.9–10.3)
Calcium: 8.5 mg/dL — ABNORMAL LOW (ref 8.9–10.3)
Chloride: 84 mmol/L — ABNORMAL LOW (ref 98–111)
Chloride: 86 mmol/L — ABNORMAL LOW (ref 98–111)
Creatinine, Ser: 0.89 mg/dL (ref 0.44–1.00)
Creatinine, Ser: 0.85 mg/dL (ref 0.44–1.00)
GFR calc Af Amer: >60 mL/min (ref >60)
GFR calc Af Amer: >60 mL/min (ref >60)
GFR calc non Af Amer: 57 mL/min — ABNORMAL LOW (ref >60)
GFR calc non Af Amer: >60 mL/min (ref >60)
Glucose, Bld: 138 mg/dL — ABNORMAL HIGH (ref 70–99)
Glucose, Bld: 123 mg/dL — ABNORMAL HIGH (ref 70–99)
Potassium: 3.6 mmol/L (ref 3.5–5.1)
Potassium: 3.5 mmol/L (ref 3.5–5.1)
Sodium: 119 mmol/L — CL (ref 135–145)
Sodium: 119 mmol/L — CL (ref 135–145)

## 2018-08-09 LAB — URIC ACID: Uric Acid, Serum: 3 mg/dL (ref 2.5–7.1)

## 2018-08-09 LAB — CBC
HCT: 30.3 % — ABNORMAL LOW (ref 36.0–46.0)
Hemoglobin: 10.4 g/dL — ABNORMAL LOW (ref 12.0–15.0)
MCH: 28.3 pg (ref 26.0–34.0)
MCHC: 34.3 g/dL (ref 30.0–36.0)
MCV: 82.3 fL (ref 80.0–100.0)
Platelets: 330 10*3/uL (ref 150–400)
RBC: 3.68 MIL/uL — ABNORMAL LOW (ref 3.87–5.11)
RDW: 14 % (ref 11.5–15.5)
WBC: 11.6 10*3/uL — ABNORMAL HIGH (ref 4.0–10.5)
nRBC: 0 % (ref 0.0–0.2)

## 2018-08-09 LAB — OCCULT BLOOD X 1 CARD TO LAB, STOOL: Fecal Occult Bld: NEGATIVE

## 2018-08-09 LAB — MAGNESIUM: Magnesium: 1.9 mg/dL (ref 1.7–2.4)

## 2018-08-09 LAB — TROPONIN I
Troponin I: <0.03 ng/mL (ref <0.03)
Troponin I: <0.03 ng/mL (ref <0.03)
Troponin I: <0.03 ng/mL (ref <0.03)

## 2018-08-09 LAB — SODIUM, URINE, RANDOM: Sodium, Ur: 61 mmol/L

## 2018-08-09 LAB — SODIUM
Sodium: 120 mmol/L — ABNORMAL LOW (ref 135–145)
Sodium: 122 mmol/L — ABNORMAL LOW (ref 135–145)
Sodium: 120 mmol/L — ABNORMAL LOW (ref 135–145)

## 2018-08-09 LAB — OSMOLALITY, URINE: Osmolality, Ur: 473 mOsm/kg (ref 300–900)

## 2018-08-09 LAB — CREATININE, URINE, RANDOM: Creatinine, Urine: 41.33 mg/dL

## 2018-08-09 LAB — LIPASE, BLOOD: Lipase: 40 U/L (ref 11–51)

## 2018-08-09 LAB — BRAIN NATRIURETIC PEPTIDE: B Natriuretic Peptide: 309.9 pg/mL — ABNORMAL HIGH (ref 0.0–100.0)

## 2018-08-09 MED ORDER — WARFARIN - PHARMACIST DOSING INPATIENT: Freq: Every day | Status: DC

## 2018-08-09 MED ORDER — POTASSIUM CHLORIDE CRYS ER 20 MEQ PO TBCR: 20.0000 meq | EXTENDED_RELEASE_TABLET | Freq: Once | ORAL | Status: DC

## 2018-08-09 MED ORDER — SODIUM CHLORIDE 0.9 % IV SOLN
INTRAVENOUS | Status: DC
  Administered 2018-08-09: 06:00:00 via INTRAVENOUS

## 2018-08-09 MED ORDER — IPRATROPIUM BROMIDE 0.02 % IN SOLN: 0.5000 mg | Freq: Three times a day (TID) | RESPIRATORY_TRACT | Status: DC

## 2018-08-09 MED ORDER — LEVALBUTEROL HCL 1.25 MG/0.5ML IN NEBU: 1.2500 mg | INHALATION_SOLUTION | Freq: Four times a day (QID) | RESPIRATORY_TRACT | Status: DC

## 2018-08-09 MED ORDER — FENTANYL CITRATE (PF) 100 MCG/2ML IJ SOLN
50.0000 ug | INTRAMUSCULAR | Status: DC | PRN
  Administered 2018-08-09 – 2018-08-11 (×4): 50 ug via INTRAVENOUS
  Filled 2018-08-09 (×4): qty 2

## 2018-08-09 MED ORDER — POTASSIUM CHLORIDE 10 MEQ/100ML IV SOLN
10.0000 meq | INTRAVENOUS | Status: AC
  Administered 2018-08-09 (×2): 10 meq via INTRAVENOUS
  Filled 2018-08-09 (×2): qty 100

## 2018-08-09 MED ORDER — FLEET ENEMA 7-19 GM/118ML RE ENEM
1.0000 | ENEMA | Freq: Once | RECTAL | Status: AC
  Administered 2018-08-09: 03:00:00 1 via RECTAL
  Filled 2018-08-09: qty 1

## 2018-08-09 MED ORDER — POTASSIUM CHLORIDE 10 MEQ/100ML IV SOLN
10.0000 meq | INTRAVENOUS | Status: AC
  Administered 2018-08-09 (×5): 10 meq via INTRAVENOUS
  Filled 2018-08-09 (×5): qty 100

## 2018-08-09 MED ORDER — DM-GUAIFENESIN ER 30-600 MG PO TB12: 1.0000 | ORAL_TABLET | Freq: Two times a day (BID) | ORAL | Status: DC | PRN

## 2018-08-09 MED ORDER — PRAVASTATIN SODIUM 40 MG PO TABS
40.0000 mg | ORAL_TABLET | Freq: Every morning | ORAL | Status: DC
  Administered 2018-08-10 – 2018-08-11 (×2): 40 mg via ORAL
  Filled 2018-08-09 (×2): qty 1

## 2018-08-09 MED ORDER — LEVALBUTEROL HCL 1.25 MG/0.5ML IN NEBU: 1.2500 mg | INHALATION_SOLUTION | Freq: Four times a day (QID) | RESPIRATORY_TRACT | Status: DC | PRN

## 2018-08-09 MED ORDER — SODIUM CHLORIDE 0.9 % IV BOLUS
500.0000 mL | Freq: Once | INTRAVENOUS | Status: AC
  Administered 2018-08-09: 06:00:00 500 mL via INTRAVENOUS

## 2018-08-09 MED ORDER — FAMOTIDINE IN NACL 20-0.9 MG/50ML-% IV SOLN
20.0000 mg | Freq: Two times a day (BID) | INTRAVENOUS | Status: DC
  Administered 2018-08-09 – 2018-08-10 (×4): 20 mg via INTRAVENOUS
  Filled 2018-08-09 (×5): qty 50

## 2018-08-09 MED ORDER — WARFARIN SODIUM 4 MG PO TABS
4.0000 mg | ORAL_TABLET | Freq: Once | ORAL | Status: AC
  Administered 2018-08-09: 20:00:00 4 mg via ORAL
  Filled 2018-08-09: qty 1

## 2018-08-09 MED ORDER — HYDRALAZINE HCL 20 MG/ML IJ SOLN
5.0000 mg | INTRAMUSCULAR | Status: DC | PRN
  Administered 2018-08-10: 18:00:00 5 mg via INTRAVENOUS
  Filled 2018-08-09: qty 1

## 2018-08-09 MED ORDER — POLYETHYLENE GLYCOL 3350 17 G PO PACK
17.0000 g | PACK | Freq: Two times a day (BID) | ORAL | Status: AC
  Administered 2018-08-09 – 2018-08-10 (×2): 17 g via ORAL
  Filled 2018-08-09 (×2): qty 1

## 2018-08-09 MED ORDER — METOCLOPRAMIDE HCL 5 MG/ML IJ SOLN
5.0000 mg | Freq: Three times a day (TID) | INTRAMUSCULAR | Status: DC
  Administered 2018-08-09 – 2018-08-11 (×8): 5 mg via INTRAVENOUS
  Filled 2018-08-09 (×8): qty 2

## 2018-08-09 MED ORDER — LORAZEPAM 2 MG/ML IJ SOLN: 0.2500 mg | Freq: Three times a day (TID) | INTRAMUSCULAR | Status: DC | PRN

## 2018-08-09 MED ORDER — POTASSIUM CHLORIDE CRYS ER 20 MEQ PO TBCR
20.0000 meq | EXTENDED_RELEASE_TABLET | Freq: Two times a day (BID) | ORAL | Status: DC
  Administered 2018-08-09 – 2018-08-11 (×4): 20 meq via ORAL
  Filled 2018-08-09 (×4): qty 1

## 2018-08-09 MED ORDER — ACETAMINOPHEN 325 MG PO TABS: 650.0000 mg | ORAL_TABLET | Freq: Four times a day (QID) | ORAL | Status: DC | PRN

## 2018-08-09 MED ORDER — METHYLPREDNISOLONE SODIUM SUCC 125 MG IJ SOLR: 60.0000 mg | Freq: Two times a day (BID) | INTRAMUSCULAR | Status: DC

## 2018-08-09 MED ORDER — CLONAZEPAM 0.5 MG PO TABS
0.5000 mg | ORAL_TABLET | Freq: Every day | ORAL | Status: DC
  Administered 2018-08-09 – 2018-08-10 (×2): 0.5 mg via ORAL
  Filled 2018-08-09 (×2): qty 1

## 2018-08-09 MED ORDER — LEVALBUTEROL HCL 1.25 MG/0.5ML IN NEBU: 1.2500 mg | INHALATION_SOLUTION | Freq: Three times a day (TID) | RESPIRATORY_TRACT | Status: DC

## 2018-08-09 MED ORDER — ACETAMINOPHEN 650 MG RE SUPP: 650.0000 mg | Freq: Four times a day (QID) | RECTAL | Status: DC | PRN

## 2018-08-09 MED ORDER — MORPHINE SULFATE (PF) 2 MG/ML IV SOLN: 2.0000 mg | INTRAVENOUS | Status: DC | PRN

## 2018-08-09 MED ORDER — MAGNESIUM SULFATE IN D5W 1-5 GM/100ML-% IV SOLN
1.0000 g | Freq: Once | INTRAVENOUS | Status: AC
  Administered 2018-08-09: 03:00:00 1 g via INTRAVENOUS
  Filled 2018-08-09: qty 100

## 2018-08-09 MED ORDER — HYDROXYZINE HCL 10 MG PO TABS: 10.0000 mg | ORAL_TABLET | Freq: Three times a day (TID) | ORAL | Status: DC | PRN

## 2018-08-09 MED ORDER — IPRATROPIUM BROMIDE 0.02 % IN SOLN: 0.5000 mg | RESPIRATORY_TRACT | Status: DC

## 2018-08-09 MED ORDER — ENSURE ENLIVE PO LIQD
237.0000 mL | Freq: Two times a day (BID) | ORAL | Status: DC
  Administered 2018-08-09 – 2018-08-10 (×2): 237 mL via ORAL

## 2018-08-09 MED ORDER — AMLODIPINE BESYLATE 10 MG PO TABS
10.0000 mg | ORAL_TABLET | Freq: Every morning | ORAL | Status: DC
  Administered 2018-08-10 – 2018-08-11 (×2): 10 mg via ORAL
  Filled 2018-08-09 (×2): qty 1

## 2018-08-09 NOTE — Plan of Care (Signed)
  Problem: Education: Goal: Knowledge of General Education information will improve Description Including pain rating scale, medication(s)/side effects and non-pharmacologic comfort measures Outcome: Progressing   Problem: Health Behavior/Discharge Planning: Goal: Ability to manage health-related needs will improve Outcome: Progressing   

## 2018-08-09 NOTE — Progress Notes (Signed)
Asked by RN to assist with ngt insertion.  16Fr ngt inserted on 1st attempt, positive air bolus heard over stomach, and approx 100cc coffee ground contents suctioned from stomach. Pt tolerated well.

## 2018-08-09 NOTE — Progress Notes (Signed)
CRITICAL VALUE ALERT  Critical Value: sodium 119  Date & Time Notied:  08/09/2018 0911  Provider Notified: Cyndia Skeeters, MD  Orders Received/Actions taken: pending

## 2018-08-09 NOTE — Consult Note (Signed)
[facility]  INPATIENT CONSULTATION  Reason for Consultation: hyponatremia Requesting Provider: Dr. Cyndia Skeeters  HPI: [name] is an 85 y.o. female GERD, OA, atrial fibrillation on coumadin, h/o bradycardia s/p PPM, anxiety, HL who presented to the ED last PM for 4 days of N/V.    She presented from home via EMS with several day (~4d) h/o nonbloody nausea and vomiting.  She tells me she thinks this is due to po APAP, then tells me it's from low Na diet.  No fevers, chills, dyspnea, cough, dysuria, hematuria.  She does relate a history of drinking 6+ bottles of water each day, very low Na diet due to hypertension.  Breakfast is frosted flakes, lunch 1/2 pimento cheese sandwich, dinner variable (chicken pot pie, chicken alfredo).  She was recently told to decrease water intake and increase  sodium intake but it's unclear this timeline and if she made these changes.  She denies HA, confusion but says she's been having some intermittently diplopia x 1 week, not today.  Her nausea is improving and her NG tube was remove just now with clear liquid diet ordered.    ED noted WCT on monitoring but this has been reviewed by cardiology and NOT a WCT.  She was given IV magnesium and 1L NS on arrival to ED for the [facility].  Labs in the ED showed covid negative, WBC 9.2, INR 2, Na 122, K 4, Mag 1.5.   CT abd/pelvis without acute pathology. Wt on presentation 155lbs, today 159lbs.  Wt is similar to admission in 04/2018.   Home meds include dyazide 37.5/25 daily, KCL 20 BID, amlodipine 10 daily, klonopin 0.5 BID prn, H2 blocker, irbesartan 300 daily, tapazole 5 daily, coumadin.   She was admitted 4/21-4/22 for chest pain.  MI ruled out, EF >65%..  TSH > 5 so tapazole dose increased. NS 524mL bolus for hyponatremia, planned outpt BMP. ] Serum sodium ranges 127-133 generally since 2014.   At baseline she lives alone but son lives next door.    PMH: Past Medical History:  Diagnosis Date  . Anxiety    . Atrial fibrillation ([place])   . Bradycardia   . Cancer ([place])    skin  . Cataract    removed bilat with lens both eyes   . Complication of anesthesia   . Difficulty sleeping   . Dysrhythmia    A FIB  . GERD (gastroesophageal reflux disease)   . H/O hiatal hernia   . History of kidney stones   . History of skin cancer   . History of transfusion   . Hyperlipidemia   . Hypertension   . Osteoarthritis   . Pacemaker   . Pneumonia   . PONV (postoperative nausea and vomiting)   . Shortness of breath    WITH EXERTION   PSH: Past Surgical History:  Procedure Laterality Date  . APPENDECTOMY  1960  . ARTHROPLASTY  1994   Left total knee  . ARTHROPLASTY  2000   Total right knee  . BACK SURGERY    . CATARACT EXTRACTION, BILATERAL     with lens implants  . CHOLECYSTECTOMY    . COLONOSCOPY    . DG SELECTED HSG GDC ONLY  2002   Dilation  . Microdiskectomy  05/2004   Left L4-L5  . PACEMAKER INSERTION    . POLYPECTOMY    . TOTAL ABDOMINAL HYSTERECTOMY  1969  . TOTAL HIP ARTHROPLASTY Right 12/24/2013   Procedure: RIGHT TOTAL HIP ARTHROPLASTY ANTERIOR APPROACH;  Surgeon: Gearlean Alf, MD;  Location: [facility];  Service: Orthopedics;  Laterality: Right;  . UPPER GASTROINTESTINAL ENDOSCOPY      Past Medical History:  Diagnosis Date  . Anxiety   . Atrial fibrillation ([place])   . Bradycardia   . Cancer ([place])    skin  . Cataract    removed bilat with lens both eyes   . Complication of anesthesia   . Difficulty sleeping   . Dysrhythmia    A FIB  . GERD (gastroesophageal reflux disease)   . H/O hiatal hernia   . History of kidney stones   . History of skin cancer   . History of transfusion   . Hyperlipidemia   . Hypertension   . Osteoarthritis   . Pacemaker   . Pneumonia   . PONV (postoperative nausea and vomiting)   . Shortness of breath    WITH EXERTION    Medications:  I have reviewed the patient's current medications.  Medications Prior to Admission  Medication  Sig Dispense Refill  . acetaminophen (TYLENOL) 325 MG tablet Take 650 mg by mouth every 6 (six) hours as needed for mild pain.     Marland Kitchen amLODipine (NORVASC) 10 MG tablet Take 10 mg by mouth every morning.     . clonazePAM (KLONOPIN) 0.5 MG tablet Take 0.5 mg by mouth at bedtime as needed for anxiety.     . docusate sodium (COLACE) 100 MG capsule Take 100 mg by mouth daily as needed for mild constipation.    . famotidine (PEPCID) 20 MG tablet Take 1 tablet (20 mg total) by mouth 2 (two) times daily. Take every AM and at bedtime (Patient taking differently: Take 20 mg by mouth 2 (two) times daily. ) 60 tablet 11  . irbesartan (AVAPRO) 300 MG tablet Take 300 mg by mouth every morning.    . methimazole (TAPAZOLE) 5 MG tablet Take 1 tablet (5 mg total) by mouth daily.    . ondansetron (ZOFRAN) 4 MG tablet Take 1 tablet (4 mg total) by mouth every 4 (four) hours as needed for nausea or vomiting. (Patient taking differently: Take 2 mg by mouth every 4 (four) hours as needed for nausea or vomiting. ) 20 tablet 1  . potassium chloride SA (K-DUR) 20 MEQ tablet Take 20 mEq by mouth 2 (two) times daily.    . pravastatin (PRAVACHOL) 40 MG tablet Take 40 mg by mouth every morning.     . traMADol (ULTRAM) 50 MG tablet Take 25 mg by mouth every 6 (six) hours as needed for moderate pain.    Marland Kitchen triamterene-hydrochlorothiazide (DYAZIDE) 37.5-25 MG capsule TAKE ONE CAPSULE BY MOUTH EVERY DAY (Patient taking differently: Take 1 capsule by mouth daily. ) 90 capsule 3  . warfarin (COUMADIN) 2 MG tablet Take 1-2 tablets (2-4 mg total) by mouth See admin instructions. Take 4 mg by mouth on MWF and 2 mg all other days (Patient taking differently: Take 2-4 mg by mouth See admin instructions. Take 4 mg by mouth on MWF and 2 mg all other days.)    . omeprazole (PRILOSEC) 20 MG capsule Take 20 mg by mouth daily. 30-60 minutes before dinner.     ALLERGIES:   Allergies  Allergen Reactions  . Morphine And Related Nausea And Vomiting     FAM HX: Family History  Problem Relation Age of Onset  . Heart disease Mother   . Leukemia Father   . Colon cancer Neg Hx   . Esophageal cancer  Neg Hx   . Stomach cancer Neg Hx   . Pancreatic cancer Neg Hx   . Liver disease Neg Hx   . Colon polyps Neg Hx     Social History:   reports that she quit smoking about 44 years ago. She has never used smokeless tobacco. She reports that she does not drink alcohol or use drugs.  ROS: 12 system ROS negative except per HPI above.   Blood pressure (!) 142/59, pulse 61, temperature (!) 97.4 F (36.3 C), temperature source Oral, resp. rate 18, height 5\' 3"  (1.6 m), weight 72.1 kg, SpO2 97 %. PHYSICAL EXAM: Gen: comfortably lying in bed flat  Eyes: anicteric, EOMI ENT: MMM, lips dry Neck: supple CV:  RRR Abd: soft, nontender, NABS GU: no foley Extr: no edema Neuro: conversant, a bit tangential Skin: cool and dry   Results for orders placed or performed during the hospital encounter of 08/08/18 (from the past 48 hour(s))  CBC with Differential     Status: Abnormal   Collection Time: 08/08/18  9:31 PM  Result Value Ref Range   WBC 9.2 4.0 - 10.5 K/uL   RBC 4.09 3.87 - 5.11 MIL/uL   Hemoglobin 11.3 (L) 12.0 - 15.0 g/dL   HCT 34.0 (L) 36.0 - 46.0 %   MCV 83.1 80.0 - 100.0 fL   MCH 27.6 26.0 - 34.0 pg   MCHC 33.2 30.0 - 36.0 g/dL   RDW 13.9 11.5 - 15.5 %   Platelets 350 150 - 400 K/uL   nRBC 0.0 0.0 - 0.2 %   Neutrophils Relative % 81 %   Neutro Abs 7.4 1.7 - 7.7 K/uL   Lymphocytes Relative 11 %   Lymphs Abs 1.0 0.7 - 4.0 K/uL   Monocytes Relative 8 %   Monocytes Absolute 0.7 0.1 - 1.0 K/uL   Eosinophils Relative 0 %   Eosinophils Absolute 0.0 0.0 - 0.5 K/uL   Basophils Relative 0 %   Basophils Absolute 0.0 0.0 - 0.1 K/uL   Immature Granulocytes 0 %   Abs Immature Granulocytes 0.03 0.00 - 0.07 K/uL    Comment: Performed at [facility], [address] [place], [place] [address]  Comprehensive metabolic panel      Status: Abnormal   Collection Time: 08/08/18  9:31 PM  Result Value Ref Range   Sodium 122 (L) 135 - 145 mmol/L   Potassium 4.0 3.5 - 5.1 mmol/L   Chloride 83 (L) 98 - 111 mmol/L   CO2 25 22 - 32 mmol/L   Glucose, Bld 111 (H) 70 - 99 mg/dL   BUN 15 8 - 23 mg/dL   Creatinine, Ser 0.92 0.44 - 1.00 mg/dL   Calcium 9.9 8.9 - 10.3 mg/dL   Total Protein 6.9 6.5 - 8.1 g/dL   Albumin 3.9 3.5 - 5.0 g/dL   AST 21 15 - 41 U/L   ALT 16 0 - 44 U/L   Alkaline Phosphatase 107 38 - 126 U/L   Total Bilirubin 0.8 0.3 - 1.2 mg/dL   GFR calc non Af Amer 55 (L) >60 mL/min   GFR calc Af Amer >60 >60 mL/min   Anion gap 14 5 - 15    Comment: Performed at [facility], [place] [address] [place], [place] [address]  Magnesium     Status: Abnormal   Collection Time: 08/08/18  9:31 PM  Result Value Ref Range   Magnesium 1.5 (L) 1.7 - 2.4 mg/dL  Comment: Performed at [facility], [place] [address], [place], [place] [address]  Troponin I - ONCE - STAT     Status: None   Collection Time: 08/08/18  9:31 PM  Result Value Ref Range   Troponin I <0.03 <0.03 ng/mL    Comment: Performed at [facility], [place] [address] [place], [place] [address]  Protime-INR     Status: Abnormal   Collection Time: 08/08/18  9:31 PM  Result Value Ref Range   Prothrombin Time 22.1 (H) 11.4 - 15.2 seconds   INR 2.0 (H) 0.8 - 1.2    Comment: (NOTE) INR goal varies based on device and disease states. Performed at [facility], [place] [address] [place], [place] [address]   TSH     Status: Abnormal   Collection Time: 08/08/18  9:31 PM  Result Value Ref Range   TSH 7.778 (H) 0.350 - 4.500 uIU/mL    Comment: Performed by a 3rd Generation assay with a functional sensitivity of <=0.01 uIU/mL. Performed at [facility], [place] [address] [place], [place] [address]   SARS Coronavirus 2 (CEPHEID - Performed in [facility] hospital lab), Hosp Order     Status: None   Collection Time: 08/08/18  9:38 PM  Result  Value Ref Range   SARS Coronavirus 2 NEGATIVE NEGATIVE    Comment: (NOTE) If result is NEGATIVE SARS-CoV-2 target nucleic acids are NOT DETECTED. The SARS-CoV-2 RNA is generally detectable in upper and lower  respiratory specimens during the acute phase of infection. The lowest  concentration of SARS-CoV-2 viral copies this assay can detect is 250  copies / mL. A negative result does not preclude SARS-CoV-2 infection  and should not be used as the sole basis for treatment or other  patient management decisions.  A negative result may occur with  improper specimen collection / handling, submission of specimen other  than nasopharyngeal swab, presence of viral mutation(s) within the  areas targeted by this assay, and inadequate number of viral copies  (<250 copies / mL). A negative result must be combined with clinical  observations, patient history, and epidemiological information. If result is POSITIVE SARS-CoV-2 target nucleic acids are DETECTED. The SARS-CoV-2 RNA is generally detectable in upper and lower  respiratory specimens dur ing the acute phase of infection.  Positive  results are indicative of active infection with SARS-CoV-2.  Clinical  correlation with patient history and other diagnostic information is  necessary to determine patient infection status.  Positive results do  not rule out bacterial infection or co-infection with other viruses. If result is PRESUMPTIVE POSTIVE SARS-CoV-2 nucleic acids MAY BE PRESENT.   A presumptive positive result was obtained on the submitted specimen  and confirmed on repeat testing.  While 2019 novel coronavirus  (SARS-CoV-2) nucleic acids may be present in the submitted sample  additional confirmatory testing may be necessary for epidemiological  and / or clinical management purposes  to differentiate between  SARS-CoV-2 and other Sarbecovirus currently known to infect humans.  If clinically indicated additional testing with an  alternate test  methodology [phone]) is advised. The SARS-CoV-2 RNA is generally  detectable in upper and lower respiratory sp ecimens during the acute  phase of infection. The expected result is Negative. Fact Sheet for Patients:  StrictlyIdeas.no Fact Sheet for Healthcare Providers: BankingDealers.co.za This test is not yet approved or cleared by the [place] FDA and has been authorized for detection and/or diagnosis of SARS-CoV-2 by FDA  under an Emergency Use Authorization (EUA).  This EUA will remain in effect (meaning this test can be used) for the duration of the COVID-19 declaration under Section 564(b)(1) of the Act, 21 U.S.C. section 360bbb-3(b)(1), unless the authorization is terminated or revoked sooner. Performed at [facility], [place] [address], [place], [place] [address]   Osmolality     Status: Abnormal   Collection Time: 08/09/18  1:14 AM  Result Value Ref Range   Osmolality 251 (L) 275 - 295 mOsm/kg    Comment: Performed at [facility], [place] [address], [place], [place] [address]  Brain natriuretic peptide     Status: Abnormal   Collection Time: 08/09/18  1:14 AM  Result Value Ref Range   B Natriuretic Peptide 309.9 (H) 0.0 - 100.0 pg/mL    Comment: Performed at [facility] [address], [place], [place] [address]  Troponin I - Now Then Q6H     Status: None   Collection Time: 08/09/18  1:14 AM  Result Value Ref Range   Troponin I <0.03 <0.03 ng/mL    Comment: Performed at [facility] [address], [place], [place] [address]  Basic metabolic panel     Status: Abnormal   Collection Time: 08/09/18  1:14 AM  Result Value Ref Range   Sodium 119 (LL) 135 - 145 mmol/L    Comment: CRITICAL RESULT CALLED TO, READ BACK BY AND VERIFIED WITH: A  HODGES,RN [phone] [id number] WILDERK    Potassium 3.6 3.5 - 5.1 mmol/L   Chloride 84 (L) 98 - 111 mmol/L   CO2 23 22 - 32 mmol/L   Glucose, Bld 138  (H) 70 - 99 mg/dL   BUN 16 8 - 23 mg/dL   Creatinine, Ser 0.89 0.44 - 1.00 mg/dL   Calcium 9.0 8.9 - 10.3 mg/dL   GFR calc non Af Amer 57 (L) >60 mL/min   GFR calc Af Amer >60 >60 mL/min   Anion gap 12 5 - 15    Comment: Performed at [facility], [place] [address], [place], [place] [address]  Lipase, blood     Status: None   Collection Time: 08/09/18  1:14 AM  Result Value Ref Range   Lipase 40 11 - 51 U/L    Comment: Performed at [facility] [address] [place], [place] [address]  Troponin I - Now Then Q6H     Status: None   Collection Time: 08/09/18  7:36 AM  Result Value Ref Range   Troponin I <0.03 <0.03 ng/mL    Comment: Performed at [facility] [address] [place], [place] [address]  Basic metabolic panel     Status: Abnormal   Collection Time: 08/09/18  7:36 AM  Result Value Ref Range   Sodium 119 (LL) 135 - 145 mmol/L    Comment: CRITICAL RESULT CALLED TO, READ BACK BY AND VERIFIED WITH: T DO RN [phone] [id number] BY A BENNETT    Potassium 3.5 3.5 - 5.1 mmol/L   Chloride 86 (L) 98 - 111 mmol/L   CO2 23 22 - 32 mmol/L   Glucose, Bld 123 (H) 70 - 99 mg/dL   BUN 15 8 - 23 mg/dL   Creatinine, Ser 0.85 0.44 - 1.00 mg/dL   Calcium 8.5 (L) 8.9 - 10.3 mg/dL   GFR calc non Af Amer >60 >60 mL/min   GFR calc Af Amer >60 >60 mL/min   Anion  gap 10 5 - 15    Comment: Performed at [facility], [place] [address] [place], [place] [address]  CBC     Status: Abnormal   Collection Time: 08/09/18  7:36 AM  Result Value Ref Range   WBC 11.6 (H) 4.0 - 10.5 K/uL   RBC 3.68 (L) 3.87 - 5.11 MIL/uL   Hemoglobin 10.4 (L) 12.0 - 15.0 g/dL   HCT 30.3 (L) 36.0 - 46.0 %   MCV 82.3 80.0 - 100.0 fL   MCH 28.3 26.0 - 34.0 pg   MCHC 34.3 30.0 - 36.0 g/dL   RDW 14.0 11.5 - 15.5 %   Platelets 330 150 - 400 K/uL   nRBC 0.0 0.0 - 0.2 %    Comment: Performed at [facility], [place] [address], [place], [place] [address]  Osmolality, urine     Status: None   Collection  Time: 08/09/18  9:13 AM  Result Value Ref Range   Osmolality, Ur 473 300 - 900 mOsm/kg    Comment: Performed at [facility] [address] [place], [place] [address]  Creatinine, urine, random     Status: None   Collection Time: 08/09/18  9:13 AM  Result Value Ref Range   Creatinine, Urine 41.33 mg/dL    Comment: Performed at [facility] [address] [place], [place] [address]  Sodium, urine, random     Status: None   Collection Time: 08/09/18  9:13 AM  Result Value Ref Range   Sodium, Ur 61 mmol/L    Comment: Performed at [facility] [address], [place], [place] [address]    Ct Abdomen Pelvis W Contrast  Result Date: 08/09/2018 CLINICAL DATA:  85 year old female with abdominal distention, nausea vomiting and epigastric pain. EXAM: CT ABDOMEN AND PELVIS WITH CONTRAST TECHNIQUE: Multidetector CT imaging of the abdomen and pelvis was performed using the standard protocol following bolus administration of intravenous contrast. CONTRAST:  163mL OMNIPAQUE IOHEXOL 300 MG/ML  SOLN COMPARISON:  None. FINDINGS: Lower chest: There are bibasilar atelectatic changes. The visualized lung bases are otherwise clear. There is mild cardiomegaly and coronary vascular calcification. A cardiac pacemaker wire is noted. No intra-abdominal free air or free fluid. Hepatobiliary: The liver is unremarkable. Cholecystectomy. No calcified stone noted in the central CBD. Pancreas: Unremarkable. No pancreatic ductal dilatation or surrounding inflammatory changes. Spleen: Normal in size without focal abnormality. Adrenals/Urinary Tract: The adrenal glands are unremarkable. There is no hydronephrosis on either side. There is symmetric enhancement and excretion of contrast by both kidneys. There is a 2.5 x 3.7 cm high attenuating exophytic lesion in the inferior pole of the left kidney which does not demonstrate significant attenuation difference in delayed images and most likely represents a  proteinaceous or hemorrhagic cyst. Further evaluation with ultrasound on a nonemergent basis recommended. Several additional cysts and smaller hypodense lesions which are too small to characterize noted. The visualized ureters and urinary bladder appear unremarkable. Stomach/Bowel: There is a small hiatal hernia. Fluid within the distal esophagus, likely related to reflux. There is no bowel obstruction. There is sigmoid diverticulosis. Moderate stool noted throughout the colon. A 19 x 14 mm polypoid appearing lesion in the right posterior rectal wall (series 9, image 91) may represent a fold or a soft tissue lesion. Further evaluation with rectal exam recommended. Vascular/Lymphatic: There is advanced aortoiliac atherosclerotic disease. Apparent areas of intraluminal low attenuation involving the common femoral vein bilaterally (series 9, image 83) most  likely artifactual. DVT is considered less likely. Duplex ultrasound can provide better evaluation if there is high clinical concern for DVT. There is no adenopathy. Reproductive: Hysterectomy. No pelvic mass. Other: None Musculoskeletal: Right hip arthroplasty. Osteopenia with degenerative changes of the spine. No acute osseous pathology. IMPRESSION: 1. No acute intra-abdominal or pelvic pathology. 2. Sigmoid diverticulosis. No bowel obstruction or active inflammation. 3. A 19 x 14 mm polypoid lesion in the right posterior rectal wall, likely a fold or a soft tissue lesion. Further evaluation with rectal exam recommended. 4. High attenuating exophytic left renal inferior pole lesion, likely a complex cyst. Further evaluation with ultrasound on a nonemergent basis recommended. Electronically Signed   By: Anner Crete M.D.   On: 08/09/2018 00:14   Dg Chest Portable 1 View  Result Date: 08/08/2018 CLINICAL DATA:  Tachycardia EXAM: PORTABLE CHEST 1 VIEW COMPARISON:  July 18, 2018 FINDINGS: There is scarring and atelectasis in the left base. Lungs elsewhere are  clear. Heart is mildly enlarged with pulmonary vascularity. Pacemaker lead is attached to the right ventricle. There is aortic atherosclerosis. No adenopathy. There is degenerative change in each shoulder. IMPRESSION: Scarring and atelectasis in left lung base. Lungs elsewhere clear. Stable cardiac prominence. Stable pacemaker lead positioning. Aortic Atherosclerosis (ICD10-I70.0). Electronically Signed   By: Lowella Grip III M.D.   On: 08/08/2018 21:47    Assessment/Plan **Hyponatremia:  Review of historic labs shows longstanding mild to moderate hyponatremia with serum Na generally 125-132 dating to 2014.  On presentation yesterday Na 122 on background of 4 days of N/V.  Uosm 288 on 07/19/18 but was 473 this AM.  Despite 4.6L of isotonic fluids overnighther serum sodium is now 119; UOP was not documented.   Based on history she has elements of thiazide induced, SIADH and/or low solute intake hyponatremia.  Events in recent past would be consistent with hypovolemic hyponatremia on top of chronic issues.  Serum sodium worse despite volume resuscitation. This is challenging.    Overall she is relatively asymptomatic and I favor more conservative management at this time.    --Hold on IVF  --Protein supplement, po diet --Fluid restrict to 1549mL/day --Agree with q6h serum sodium, one pending now --> based on course will need to adjust management.  If worsening or becomes symptomatic she'll have to have 3% hypertonic therapy but if stable to slowly improving would hold the course.   --Added on serum osm to this AM but fully expect truly hypotonic hyponatremia.  --Check serum uric acid, AM cortisol, TSH --Hold diuretics now AND at discharge given chronic issue --Strict I/Os, daily weights  **HTN: recent BP 142/59, hold diuretic due to hypoNa, cont other meds.    **Exophytic renal cyst: noted on admission CT.  Will need renal US to follow up, preferably after discharge.   Please page anytime  with questions or concerns.  Additionally page with clinical status changes.   Justin Mend 08/09/2018, 12:36 PM

## 2018-08-09 NOTE — ED Notes (Signed)
ED TO INPATIENT HANDOFF REPORT  ED Nurse Name and Phone #: [name], [phone]  S Name/Age/Gender [name] 85 y.o. female Room/Bed: 031C/031C  Code Status   Code Status: Full Code  Home/SNF/Other Home Patient oriented to: self, place, time and situation Is this baseline? Yes   Triage Complete: Triage complete  Chief Complaint Nausea,Vomiting    Triage Note Pt came in with c/o nausea and vomiting x4 days.  Denies any pain at this time.  Per PCP, pt NA was low   Allergies Allergies  Allergen Reactions  . Morphine And Related Nausea And Vomiting    Level of Care/Admitting Diagnosis ED Disposition    ED Disposition Condition [place] Kim Area: [place] [[id number]]  Level of Care: Progressive [102]  Covid Evaluation: N/A  DiagnosisStephanie Coup [place] Kim) [[id number]]  Admitting Physician: Ivor Costa [[id number]]  Attending Physician: Ivor Costa [phone]  Estimated length of stay: past midnight tomorrow  Certification:: I certify this patient will need inpatient services for at least 2 midnights  PT Class (Do Not Modify): Inpatient [101]  PT Acc Code (Do Not Modify): Private [1]       B Medical/Surgery History Past Medical History:  Diagnosis Date  . Anxiety   . Atrial fibrillation ([place])   . Bradycardia   . Cancer ([place])    skin  . Cataract    removed bilat with lens both eyes   . Complication of anesthesia   . Difficulty sleeping   . Dysrhythmia    A FIB  . GERD (gastroesophageal reflux disease)   . H/O hiatal hernia   . History of kidney stones   . History of skin cancer   . History of transfusion   . Hyperlipidemia   . Hypertension   . Osteoarthritis   . Pacemaker   . Pneumonia   . PONV (postoperative nausea and vomiting)   . Shortness of breath    WITH EXERTION   Past Surgical History:  Procedure Laterality Date  . APPENDECTOMY  1960  . ARTHROPLASTY  1994   Left total knee  . ARTHROPLASTY  2000   Total right knee  .  BACK SURGERY    . CATARACT EXTRACTION, BILATERAL     with lens implants  . CHOLECYSTECTOMY    . COLONOSCOPY    . DG SELECTED HSG [facility] ONLY  2002   Dilation  . Microdiskectomy  05/2004   Left L4-L5  . PACEMAKER INSERTION    . POLYPECTOMY    . TOTAL ABDOMINAL HYSTERECTOMY  1969  . TOTAL HIP ARTHROPLASTY Right 12/24/2013   Procedure: RIGHT TOTAL HIP ARTHROPLASTY ANTERIOR APPROACH;  Surgeon: Denise Alf, MD;  Location: [facility];  Service: Orthopedics;  Laterality: Right;  . UPPER GASTROINTESTINAL ENDOSCOPY       A IV Location/Drains/Wounds Patient Lines/Drains/Airways Status   Active Line/Drains/Airways    Name:   Placement date:   Placement time:   Site:   Days:   Peripheral IV 08/08/18 Left Antecubital   08/08/18    2045    Antecubital   1   Peripheral IV 08/08/18 Right Antecubital   08/08/18    -    Antecubital   1   External Urinary Catheter   07/18/18    2000    -   22          Intake/Output Last 24 hours  Intake/Output Summary (Last 24 hours) at 08/09/2018 0114 Last data filed at 08/08/2018  2145 Gross per 24 hour  Intake 100 ml  Output -  Net 100 ml    Labs/Imaging Results for orders placed or performed during the Kim encounter of 08/08/18 (from the past 48 hour(s))  CBC with Differential     Status: Abnormal   Collection Time: 08/08/18  9:31 PM  Result Value Ref Range   WBC 9.2 4.0 - 10.5 K/uL   RBC 4.09 3.87 - 5.11 MIL/uL   Hemoglobin 11.3 (L) 12.0 - 15.0 g/dL   HCT 34.0 (L) 36.0 - 46.0 %   MCV 83.1 80.0 - 100.0 fL   MCH 27.6 26.0 - 34.0 pg   MCHC 33.2 30.0 - 36.0 g/dL   RDW 13.9 11.5 - 15.5 %   Platelets 350 150 - 400 K/uL   nRBC 0.0 0.0 - 0.2 %   Neutrophils Relative % 81 %   Neutro Abs 7.4 1.7 - 7.7 K/uL   Lymphocytes Relative 11 %   Lymphs Abs 1.0 0.7 - 4.0 K/uL   Monocytes Relative 8 %   Monocytes Absolute 0.7 0.1 - 1.0 K/uL   Eosinophils Relative 0 %   Eosinophils Absolute 0.0 0.0 - 0.5 K/uL   Basophils Relative 0 %   Basophils Absolute 0.0  0.0 - 0.1 K/uL   Immature Granulocytes 0 %   Abs Immature Granulocytes 0.03 0.00 - 0.07 K/uL    Comment: Performed at [facility], [address] [place], [place] [address]  Comprehensive metabolic panel     Status: Abnormal   Collection Time: 08/08/18  9:31 PM  Result Value Ref Range   Sodium 122 (L) 135 - 145 mmol/L   Potassium 4.0 3.5 - 5.1 mmol/L   Chloride 83 (L) 98 - 111 mmol/L   CO2 25 22 - 32 mmol/L   Glucose, Bld 111 (H) 70 - 99 mg/dL   BUN 15 8 - 23 mg/dL   Creatinine, Ser 0.92 0.44 - 1.00 mg/dL   Calcium 9.9 8.9 - 10.3 mg/dL   Total Protein 6.9 6.5 - 8.1 g/dL   Albumin 3.9 3.5 - 5.0 g/dL   AST 21 15 - 41 U/L   ALT 16 0 - 44 U/L   Alkaline Phosphatase 107 38 - 126 U/L   Total Bilirubin 0.8 0.3 - 1.2 mg/dL   GFR calc non Af Amer 55 (L) >60 mL/min   GFR calc Af Amer >60 >60 mL/min   Anion gap 14 5 - 15    Comment: Performed at [facility], [place] [address], [place], [place] [address]  Magnesium     Status: Abnormal   Collection Time: 08/08/18  9:31 PM  Result Value Ref Range   Magnesium 1.5 (L) 1.7 - 2.4 mg/dL    Comment: Performed at [facility] [address], [place], [place] [address]  Troponin I - ONCE - STAT     Status: None   Collection Time: 08/08/18  9:31 PM  Result Value Ref Range   Troponin I <0.03 <0.03 ng/mL    Comment: Performed at [facility], [place] [address] [place], [place] [address]  Protime-INR     Status: Abnormal   Collection Time: 08/08/18  9:31 PM  Result Value Ref Range   Prothrombin Time 22.1 (H) 11.4 - 15.2 seconds   INR 2.0 (H) 0.8 - 1.2    Comment: (NOTE) INR goal varies based on device and disease states. Performed at [facility], [place] [address],  [place], [place] [address]   TSH     Status: Abnormal   Collection Time: 08/08/18  9:31 PM  Result Value Ref Range   TSH 7.778 (H) 0.350 - 4.500 uIU/mL    Comment: Performed by a 3rd Generation assay with a functional sensitivity of <=0.01  uIU/mL. Performed at [facility], [place] [address], [place], [place] [address]   SARS Coronavirus 2 (CEPHEID - Performed in [facility]), Hosp Order     Status: None   Collection Time: 08/08/18  9:38 PM  Result Value Ref Range   SARS Coronavirus 2 NEGATIVE NEGATIVE    Comment: (NOTE) If result is NEGATIVE SARS-CoV-2 target nucleic acids are NOT DETECTED. The SARS-CoV-2 RNA is generally detectable in upper and lower  respiratory specimens during the acute phase of infection. The lowest  concentration of SARS-CoV-2 viral copies this assay can detect is 250  copies / mL. A negative result does not preclude SARS-CoV-2 infection  and should not be used as the sole basis for treatment or other  patient management decisions.  A negative result may occur with  improper specimen collection / handling, submission of specimen other  than nasopharyngeal swab, presence of viral mutation(s) within the  areas targeted by this assay, and inadequate number of viral copies  (<250 copies / mL). A negative result must be combined with clinical  observations, patient history, and epidemiological information. If result is POSITIVE SARS-CoV-2 target nucleic acids are DETECTED. The SARS-CoV-2 RNA is generally detectable in upper and lower  respiratory specimens dur ing the acute phase of infection.  Positive  results are indicative of active infection with SARS-CoV-2.  Clinical  correlation with patient history and other diagnostic information is  necessary to determine patient infection status.  Positive results do  not rule out bacterial infection or co-infection with other viruses. If result is PRESUMPTIVE POSTIVE SARS-CoV-2 nucleic acids MAY BE PRESENT.   A presumptive positive result was obtained on the submitted specimen  and confirmed on repeat testing.  While 2019 novel coronavirus  (SARS-CoV-2) nucleic acids may be present in the submitted sample  additional confirmatory testing  may be necessary for epidemiological  and / or clinical management purposes  to differentiate between  SARS-CoV-2 and other Sarbecovirus currently known to infect humans.  If clinically indicated additional testing with an alternate test  methodology [phone]) is advised. The SARS-CoV-2 RNA is generally  detectable in upper and lower respiratory sp ecimens during the acute  phase of infection. The expected result is Negative. Fact Sheet for Patients:  StrictlyIdeas.no Fact Sheet for Healthcare Providers: BankingDealers.co.za This test is not yet approved or cleared by the [place] FDA and has been authorized for detection and/or diagnosis of SARS-CoV-2 by FDA under an Emergency Use Authorization (EUA).  This EUA will remain in effect (meaning this test can be used) for the duration of the COVID-19 declaration under Section 564(b)(1) of the Act, 21 U.S.C. section 360bbb-3(b)(1), unless the authorization is terminated or revoked sooner. Performed at [facility], [place] [address] [place], [place] [address]    Dg Chest Portable 1 View  Result Date: 08/08/2018 CLINICAL DATA:  Tachycardia EXAM: PORTABLE CHEST 1 VIEW COMPARISON:  July 18, 2018 FINDINGS: There is scarring and atelectasis in the left base. Lungs elsewhere are clear. Heart is mildly enlarged with pulmonary vascularity. Pacemaker lead is attached to the right ventricle. There is aortic atherosclerosis. No adenopathy. There is degenerative change in each shoulder. IMPRESSION: Scarring  and atelectasis in left lung base. Lungs elsewhere clear. Stable cardiac prominence. Stable pacemaker lead positioning. Aortic Atherosclerosis (ICD10-I70.0). Electronically Signed   By: Lowella Kim III M.D.   On: 08/08/2018 21:47    Pending Labs Unresulted Labs (From admission, onward)    Start     Ordered   08/09/18 4967  Basic metabolic panel  Tomorrow morning,   R     08/09/18 0048    08/09/18 0500  CBC  Tomorrow morning,   R     08/09/18 0048   08/09/18 5916  Basic metabolic panel  Now then every 6 hours,   R     08/09/18 0046   08/09/18 0019  Osmolality  Once,   R     08/09/18 0018   08/09/18 0019  Creatinine, urine, random  Once,   R     08/09/18 0018   08/09/18 0019  Sodium, urine, random  Once,   R     08/09/18 0018   08/09/18 0019  Brain natriuretic peptide  Once,   R     08/09/18 0018   08/09/18 0019  Troponin I - Now Then Q6H  Now then every 6 hours,   R     08/09/18 0018   08/09/18 0018  Osmolality, urine  Once,   R     08/09/18 0018   08/08/18 2120  Urinalysis, Routine w reflex microscopic  Once,   R     08/08/18 2121   08/08/18 2120  Urine culture  ONCE - STAT,   STAT     08/08/18 2121          Vitals/Pain Today's Vitals   08/08/18 2200 08/08/18 2215 08/08/18 2230 08/09/18 0033  BP: 140/74 (!) 152/67 (!) 164/73   Pulse: (!) 59 (!) 59 (!) 59   Resp: (!) 26 20 (!) 23   Temp:    97.7 F (36.5 C)  TempSrc:    Oral  SpO2: 97% 96% 99%   Weight:      Height:        Isolation Precautions No active isolations  Medications Medications  magnesium sulfate IVPB 1 g 100 mL (has no administration in time range)  hydrALAZINE (APRESOLINE) injection 5 mg (has no administration in time range)  levalbuterol (XOPENEX) nebulizer solution 1.25 mg (has no administration in time range)  potassium chloride 10 mEq in 100 mL IVPB (has no administration in time range)  metoCLOPramide (REGLAN) injection 5 mg (has no administration in time range)  acetaminophen (TYLENOL) tablet 650 mg (has no administration in time range)    Or  acetaminophen (TYLENOL) suppository 650 mg (has no administration in time range)  magnesium sulfate 2 GM/50ML IVPB (  Stopped 08/08/18 2145)  sodium chloride 0.9 % bolus 1,000 mL (1,000 mLs Intravenous New Bag/Given 08/08/18 2140)  iohexol (OMNIPAQUE) 300 MG/ML solution 100 mL (100 mLs Intravenous Contrast Given 08/08/18 2336)     Mobility walks with device Low fall risk   Focused Assessments Cardiac Assessment Handoff:    Lab Results  Component Value Date   TROPONINI <0.03 08/08/2018   No results found for: DDIMER Does the Patient currently have chest pain? No     R Recommendations: See Admitting Provider Note  Report given to:   Additional Notes:

## 2018-08-09 NOTE — Progress Notes (Signed)
PROGRESS NOTE  [name] HGD:[id number] DOB: 1925/06/22 DOA: 08/08/2018 PCP: [name], MD   LOS: 0 days   Patient is from: home  Brief Narrative / Interim history: 85 year old female with history of hypertension, hyperlipidemia, permanent atrial fibrillation status post pacemaker and on Coumadin, hypothyroidism ID presenting with intractable nausea and vomiting for 4 days.   In the ED, bradycardic.  CBC not impressive.  Sodium 122.  Troponin negative.  INR 2.0.  COVID test negative.  CXR without acute finding.  CT abdomen and pelvis without acute finding.  Admitted for intractable nausea and vomiting.  NG tube placed.  Reportedly had wide-complex ventricular rhythm in route to ED per EMS report to EDP and was given 2 g of magnesium sulfate and cardiology consulted.  After reviewing telemetry, EKG and talking to her [facility] representative, cardiology does not believe she had arrhythmia.  Subjective: No major events overnight of this morning.  NG tube in place.  She complains of bilateral groin pain which is chronic for her.  She had right hip replacement.  She reports intolerance to opiate analgesics.  Denies chest pain, dyspnea.  NG tube with dark gastric content but no frank blood.   Assessment & Plan: Principal Problem:   V tach (Hydro) Active Problems:   Essential hypertension   ATRIAL FIBRILLATION   GERD   Cardiac pacemaker in situ   Chronic anticoagulation   Anxiety   Intractable nausea and vomiting   Hyponatremia   Hypomagnesemia   Hyperthyroidism  Intractable nausea and vomiting: Unclear etiology.  Patient without abdominal pain.  CT abdomen and pelvis not impressive except for moderate stool burden.  She has significant hyponatremia that has gotten worse with IV normal saline.  Lipase and LFT within normal range.  Gastric content and stool Hemoccult negative. -Discontinue NG tube.  No signs of SBO. -Pepcid and antiemetics as needed -Hold IV fluids given  worsening hyponatremia -Status post enema -MiraLAX twice daily x3 doses  Hyponatremia: Sodium 122 on admission and down to 119 after IV normal saline.  Urine chemistry suggestive for SIADH.  She is also on ARB and Maxzide which could contribute. -Hold ARB and Maxzide -Nephrology consulted and recommended fluid restriction and ordered additional lab (uric acid, a.m. cortisol and TSH). -Monitor sodium every 6. -Monitor electrolytes daily.  Anemia of chronic disease: Slight drop in hemoglobin likely dilutional -We will check FOBT and gastric content Hemoccult  Hyperthyroidism: TSH mildly elevated. -Continue holding methimazole  Hypokalemia/hypomagnesemia: Likely due to GI loss -Replenish and recheck  Permanent atrial fibrillation/SSS/s/p PPM, concern for WCT yesterday -Appreciate cardiology input, -Likely artifact mistaken for wide QRS complexes. -[employer] representative confirmed that there was no ectopy -Resume warfarin per pharmacy  Anxiety: Stable -Continue home medications  Constipation:  Bilateral groin pain: Likely due to lupus arthritis.  FABER and FADIR triggers pain. -PRN fentanyl.  History of steroid intolerance  Exophytic renal cyst: Noted on CT abdomen -Outpatient renal ultrasound  Hypertension: BP fairly controlled -Hold Maxzide and continue other medications  Scheduled Meds:  feeding supplement (ENSURE ENLIVE)  237 mL Oral BID BM   metoCLOPramide (REGLAN) injection  5 mg Intravenous Q8H   polyethylene glycol  17 g Oral BID   Continuous Infusions:  famotidine (PEPCID) IV Stopped (08/09/18 1114)   PRN Meds:.acetaminophen **OR** acetaminophen, fentaNYL (SUBLIMAZE) injection, hydrALAZINE, levalbuterol, LORazepam   DVT prophylaxis: Warfarin Code Status: Full code Family Communication: Attempted to call patient's son but no response.  Did not leave voicemail. Disposition Plan:  Remains inpatient pending improvement in nausea/vomiting/hyponatremia and  electronic treatment and p.o. tolerance.  Consultants:   Nephrology  Cardiology  Procedures:   NG tube 5/12-5/13  Microbiology:  [id number] negative  Antimicrobials: Anti-infectives (From admission, onward)   None       Objective: Vitals:   08/09/18 0237 08/09/18 0419 08/09/18 0800 08/09/18 1250  BP: (!) 183/66 (!) 149/64 (!) 142/59 (!) 154/66  Pulse: (!) 58 (!) 59 61 60  Resp: 16 18 18    Temp: (!) 97.5 F (36.4 C) 98 F (36.7 C) (!) 97.4 F (36.3 C) (!) 97.5 F (36.4 C)  TempSrc: Oral Oral Oral Oral  SpO2: 97% 97% 97% 100%  Weight: 72.1 kg     Height: 5\' 3"  (1.6 m)       Intake/Output Summary (Last 24 hours) at 08/09/2018 1711 Last data filed at 08/09/2018 1500 Gross per 24 hour  Intake 5368.3 ml  Output 200 ml  Net 5168.3 ml   Filed Weights   08/08/18 2141 08/09/18 0237  Weight: 70.3 kg 72.1 kg    Examination:  GENERAL: No acute distress.  Appears well.  HEENT: MMM.  Vision and hearing grossly intact.  NG tube with dark looking gastric content NECK: Supple.  No apparent JVD LUNGS:  No IWOB. Good air movement bilaterally. HEART:  RRR. Heart sounds normal.  ABD: Bowel sounds present. Soft. Non tender.  MSK/EXT:  Moves all extremities. No apparent deformity. No edema bilaterally.  FAB ER and FADIR triggers pain in groins SKIN: no apparent skin lesion or wound NEURO: Awake, alert and oriented appropriately.  No gross deficit.  PSYCH: Calm. Normal affect.    Data Reviewed: I have independently reviewed following labs and imaging studies  CBC: Recent Labs  Lab 08/08/18 2131 08/09/18 0736  WBC 9.2 11.6*  NEUTROABS 7.4  --   HGB 11.3* 10.4*  HCT 34.0* 30.3*  MCV 83.1 82.3  PLT 350 914   Basic Metabolic Panel: Recent Labs  Lab 08/08/18 2131 08/09/18 0114 08/09/18 0736 08/09/18 1148 08/09/18 1308  NA 122* 119* 119*  --  120*  K 4.0 3.6 3.5  --   --   CL 83* 84* 86*  --   --   CO2 25 23 23   --   --   GLUCOSE 111* 138* 123*  --   --   BUN  15 16 15   --   --   CREATININE 0.92 0.89 0.85  --   --   CALCIUM 9.9 9.0 8.5*  --   --   MG 1.5*  --   --  1.9  --    GFR: Estimated Creatinine Clearance: 41.9 mL/min (by C-G formula based on SCr of 0.85 mg/dL). Liver Function Tests: Recent Labs  Lab 08/08/18 2131  AST 21  ALT 16  ALKPHOS 107  BILITOT 0.8  PROT 6.9  ALBUMIN 3.9   Recent Labs  Lab 08/09/18 0114  LIPASE 40   No results for input(s): AMMONIA in the last 168 hours. Coagulation Profile: Recent Labs  Lab 08/08/18 2131  INR 2.0*   Cardiac Enzymes: Recent Labs  Lab 08/08/18 2131 08/09/18 0114 08/09/18 0736 08/09/18 1148  TROPONINI <0.03 <0.03 <0.03 <0.03   BNP (last 3 results) No results for input(s): PROBNP in the last 8760 hours. HbA1C: No results for input(s): HGBA1C in the last 72 hours. CBG: No results for input(s): GLUCAP in the last 168 hours. Lipid Profile: No results for input(s): CHOL, HDL, LDLCALC, TRIG, CHOLHDL, LDLDIRECT  in the last 72 hours. Thyroid Function Tests: Recent Labs    08/08/18 2131  TSH 7.778*   Anemia Panel: No results for input(s): VITAMINB12, FOLATE, FERRITIN, TIBC, IRON, RETICCTPCT in the last 72 hours. Urine analysis:    Component Value Date/Time   COLORURINE STRAW (A) 03/27/2018 1432   APPEARANCEUR CLEAR 03/27/2018 1432   LABSPEC 1.003 (L) 03/27/2018 1432   PHURINE 7.0 03/27/2018 1432   GLUCOSEU NEGATIVE 03/27/2018 1432   HGBUR NEGATIVE 03/27/2018 1432   BILIRUBINUR NEGATIVE 03/27/2018 1432   KETONESUR NEGATIVE 03/27/2018 1432   PROTEINUR NEGATIVE 03/27/2018 1432   UROBILINOGEN 0.2 11/12/2014 1331   NITRITE NEGATIVE 03/27/2018 1432   LEUKOCYTESUR SMALL (A) 03/27/2018 1432   Sepsis Labs: Invalid input(s): PROCALCITONIN, LACTICIDVEN  Recent Results (from the past 240 hour(s))  SARS Coronavirus 2 (CEPHEID - Performed in [place] hospital lab), Hosp Order     Status: None   Collection Time: 08/08/18  9:38 PM  Result Value Ref Range Status   SARS  Coronavirus 2 NEGATIVE NEGATIVE Final    Comment: (NOTE) If result is NEGATIVE SARS-CoV-2 target nucleic acids are NOT DETECTED. The SARS-CoV-2 RNA is generally detectable in upper and lower  respiratory specimens during the acute phase of infection. The lowest  concentration of SARS-CoV-2 viral copies this assay can detect is 250  copies / mL. A negative result does not preclude SARS-CoV-2 infection  and should not be used as the sole basis for treatment or other  patient management decisions.  A negative result may occur with  improper specimen collection / handling, submission of specimen other  than nasopharyngeal swab, presence of viral mutation(s) within the  areas targeted by this assay, and inadequate number of viral copies  (<250 copies / mL). A negative result must be combined with clinical  observations, patient history, and epidemiological information. If result is POSITIVE SARS-CoV-2 target nucleic acids are DETECTED. The SARS-CoV-2 RNA is generally detectable in upper and lower  respiratory specimens dur ing the acute phase of infection.  Positive  results are indicative of active infection with SARS-CoV-2.  Clinical  correlation with patient history and other diagnostic information is  necessary to determine patient infection status.  Positive results do  not rule out bacterial infection or co-infection with other viruses. If result is PRESUMPTIVE POSTIVE SARS-CoV-2 nucleic acids MAY BE PRESENT.   A presumptive positive result was obtained on the submitted specimen  and confirmed on repeat testing.  While 2019 novel coronavirus  (SARS-CoV-2) nucleic acids may be present in the submitted sample  additional confirmatory testing may be necessary for epidemiological  and / or clinical management purposes  to differentiate between  SARS-CoV-2 and other Sarbecovirus currently known to infect humans.  If clinically indicated additional testing with an alternate test    methodology [phone]) is advised. The SARS-CoV-2 RNA is generally  detectable in upper and lower respiratory sp ecimens during the acute  phase of infection. The expected result is Negative. Fact Sheet for Patients:  StrictlyIdeas.no Fact Sheet for Healthcare Providers: BankingDealers.co.za This test is not yet approved or cleared by the [place] FDA and has been authorized for detection and/or diagnosis of SARS-CoV-2 by FDA under an Emergency Use Authorization (EUA).  This EUA will remain in effect (meaning this test can be used) for the duration of the COVID-19 declaration under Section 564(b)(1) of the Act, 21 U.S.C. section 360bbb-3(b)(1), unless the authorization is terminated or revoked sooner. Performed at [facility], [place]  [address] [place], [place] [address]       Radiology Studies: Ct Abdomen Pelvis W Contrast  Result Date: 08/09/2018 CLINICAL DATA:  85 year old female with abdominal distention, nausea vomiting and epigastric pain. EXAM: CT ABDOMEN AND PELVIS WITH CONTRAST TECHNIQUE: Multidetector CT imaging of the abdomen and pelvis was performed using the standard protocol following bolus administration of intravenous contrast. CONTRAST:  162mL OMNIPAQUE IOHEXOL 300 MG/ML  SOLN COMPARISON:  None. FINDINGS: Lower chest: There are bibasilar atelectatic changes. The visualized lung bases are otherwise clear. There is mild cardiomegaly and coronary vascular calcification. A cardiac pacemaker wire is noted. No intra-abdominal free air or free fluid. Hepatobiliary: The liver is unremarkable. Cholecystectomy. No calcified stone noted in the central CBD. Pancreas: Unremarkable. No pancreatic ductal dilatation or surrounding inflammatory changes. Spleen: Normal in size without focal abnormality. Adrenals/Urinary Tract: The adrenal glands are unremarkable. There is no hydronephrosis on either side. There is symmetric enhancement  and excretion of contrast by both kidneys. There is a 2.5 x 3.7 cm high attenuating exophytic lesion in the inferior pole of the left kidney which does not demonstrate significant attenuation difference in delayed images and most likely represents a proteinaceous or hemorrhagic cyst. Further evaluation with ultrasound on a nonemergent basis recommended. Several additional cysts and smaller hypodense lesions which are too small to characterize noted. The visualized ureters and urinary bladder appear unremarkable. Stomach/Bowel: There is a small hiatal hernia. Fluid within the distal esophagus, likely related to reflux. There is no bowel obstruction. There is sigmoid diverticulosis. Moderate stool noted throughout the colon. A 19 x 14 mm polypoid appearing lesion in the right posterior rectal wall (series 9, image 91) may represent a fold or a soft tissue lesion. Further evaluation with rectal exam recommended. Vascular/Lymphatic: There is advanced aortoiliac atherosclerotic disease. Apparent areas of intraluminal low attenuation involving the common femoral vein bilaterally (series 9, image 83) most likely artifactual. DVT is considered less likely. Duplex ultrasound can provide better evaluation if there is high clinical concern for DVT. There is no adenopathy. Reproductive: Hysterectomy. No pelvic mass. Other: None Musculoskeletal: Right hip arthroplasty. Osteopenia with degenerative changes of the spine. No acute osseous pathology. IMPRESSION: 1. No acute intra-abdominal or pelvic pathology. 2. Sigmoid diverticulosis. No bowel obstruction or active inflammation. 3. A 19 x 14 mm polypoid lesion in the right posterior rectal wall, likely a fold or a soft tissue lesion. Further evaluation with rectal exam recommended. 4. High attenuating exophytic left renal inferior pole lesion, likely a complex cyst. Further evaluation with ultrasound on a nonemergent basis recommended. Electronically Signed   By: Anner Crete  M.D.   On: 08/09/2018 00:14   Dg Chest Portable 1 View  Result Date: 08/08/2018 CLINICAL DATA:  Tachycardia EXAM: PORTABLE CHEST 1 VIEW COMPARISON:  July 18, 2018 FINDINGS: There is scarring and atelectasis in the left base. Lungs elsewhere are clear. Heart is mildly enlarged with pulmonary vascularity. Pacemaker lead is attached to the right ventricle. There is aortic atherosclerosis. No adenopathy. There is degenerative change in each shoulder. IMPRESSION: Scarring and atelectasis in left lung base. Lungs elsewhere clear. Stable cardiac prominence. Stable pacemaker lead positioning. Aortic Atherosclerosis (ICD10-I70.0). Electronically Signed   By: Lowella Grip III M.D.   On: 08/08/2018 21:47   35 minutes with more than 50% spent in reviewing records, counseling patient and coordinating care.  Jwan Hornbaker T. [facility] Pager [phone]  If 7PM-7AM, please contact night-coverage [email or web address] Password [email or web address] 08/09/2018, 5:11 PM

## 2018-08-09 NOTE — Progress Notes (Signed)
ANTICOAGULATION CONSULT NOTE - Initial Consult  Pharmacy Consult for warfarin Indication: atrial fibrillation  Allergies  Allergen Reactions  . Morphine And Related Nausea And Vomiting    Patient Measurements: Height: 5\' 3"  (160 cm) Weight: 159 lb (72.1 kg) IBW/kg (Calculated) : 52.4   Vital Signs: Temp: 97.5 F (36.4 C) (05/13 1250) Temp Source: Oral (05/13 1250) BP: 154/66 (05/13 1250) Pulse Rate: 60 (05/13 1250)  Labs: Recent Labs    08/08/18 2131 08/09/18 0114 08/09/18 0736 08/09/18 1148  HGB 11.3*  --  10.4*  --   HCT 34.0*  --  30.3*  --   PLT 350  --  330  --   LABPROT 22.1*  --   --   --   INR 2.0*  --   --   --   CREATININE 0.92 0.89 0.85  --   TROPONINI <0.03 <0.03 <0.03 <0.03    Estimated Creatinine Clearance: 41.9 mL/min (by C-G formula based on SCr of 0.85 mg/dL).   Medical History: Past Medical History:  Diagnosis Date  . Anxiety   . Atrial fibrillation ([place])   . Bradycardia   . Cancer ([place])    skin  . Cataract    removed bilat with lens both eyes   . Complication of anesthesia   . Difficulty sleeping   . Dysrhythmia    A FIB  . GERD (gastroesophageal reflux disease)   . H/O hiatal hernia   . History of kidney stones   . History of skin cancer   . History of transfusion   . Hyperlipidemia   . Hypertension   . Osteoarthritis   . Pacemaker   . Pneumonia   . PONV (postoperative nausea and vomiting)   . Shortness of breath    WITH EXERTION    Medications:  Medications Prior to Admission  Medication Sig Dispense Refill Last Dose  . acetaminophen (TYLENOL) 325 MG tablet Take 650 mg by mouth every 6 (six) hours as needed for mild pain.    08/04/2018  . amLODipine (NORVASC) 10 MG tablet Take 10 mg by mouth every morning.    08/08/2018 at Unknown time  . clonazePAM (KLONOPIN) 0.5 MG tablet Take 0.5 mg by mouth at bedtime as needed for anxiety.    Past Week at Unknown time  . docusate sodium (COLACE) 100 MG capsule Take 100 mg by mouth  daily as needed for mild constipation.   Past Week at Unknown time  . famotidine (PEPCID) 20 MG tablet Take 1 tablet (20 mg total) by mouth 2 (two) times daily. Take every AM and at bedtime (Patient taking differently: Take 20 mg by mouth 2 (two) times daily. ) 60 tablet 11 Past Week at Unknown time  . irbesartan (AVAPRO) 300 MG tablet Take 300 mg by mouth every morning.   08/08/2018 at                                                                                  . methimazole (TAPAZOLE) 5 MG tablet Take 1 tablet (5 mg total) by mouth daily.   08/07/2018  . ondansetron (ZOFRAN) 4 MG tablet Take 1 tablet (4 mg total) by mouth every  4 (four) hours as needed for nausea or vomiting. (Patient taking differently: Take 2 mg by mouth every 4 (four) hours as needed for nausea or vomiting. ) 20 tablet 1 08/08/2018 at 3pm  . potassium chloride SA (K-DUR) 20 MEQ tablet Take 20 mEq by mouth 2 (two) times daily.   08/08/2018 at [place]  . pravastatin (PRAVACHOL) 40 MG tablet Take 40 mg by mouth every morning.    08/08/2018 at Unknown time  . traMADol (ULTRAM) 50 MG tablet Take 25 mg by mouth every 6 (six) hours as needed for moderate pain.   Past Week at Unknown time  . triamterene-hydrochlorothiazide (DYAZIDE) 37.5-25 MG capsule TAKE ONE CAPSULE BY MOUTH EVERY DAY (Patient taking differently: Take 1 capsule by mouth daily. ) 90 capsule 3 08/08/2018 at Unknown time  . warfarin (COUMADIN) 2 MG tablet Take 1-2 tablets (2-4 mg total) by mouth See admin instructions. Take 4 mg by mouth on MWF and 2 mg all other days (Patient taking differently: Take 2-4 mg by mouth See admin instructions. Take 4 mg by mouth on MWF and 2 mg all other days.)   08/08/2018 at 0700  . omeprazole (PRILOSEC) 20 MG capsule Take 20 mg by mouth daily. 30-60 minutes before dinner.   not yet   Scheduled:  . [START ON 08/10/2018] amLODipine  10 mg Oral q morning - 10a  . clonazePAM  0.5 mg Oral QHS  . feeding supplement (ENSURE ENLIVE)  237 mL Oral  BID BM  . metoCLOPramide (REGLAN) injection  5 mg Intravenous Q8H  . polyethylene glycol  17 g Oral BID  . potassium chloride SA  20 mEq Oral BID  . [START ON 08/10/2018] pravastatin  40 mg Oral q morning - 10a    Assessment: 85 yo female here with N/V and tachycardia. She is on coumadin PTA for afib and pharmacy consulted to dose -INR= 2.0 (5/12), hg= 10.4, plt= 330  Home dose: 2mg /day except take 4mg  MWF (last taken 5/12)  Goal of Therapy:  INR 2-3 Monitor platelets by anticoagulation protocol: Yes   Plan:  -Coumadin 4mg  today -daily PT/INR  Hildred Laser, PharmD Clinical Pharmacist **Pharmacist phone directory can now be found on [email or web address] (PW [email or web address]).  Listed under [facility] [place].  '

## 2018-08-09 NOTE — Progress Notes (Signed)
Progress Note  Patient Name: [name] Date of Encounter: 08/09/2018  Primary Cardiologist: Dr. Lovena Le (EP)  Subjective   Very uncomfortable appearing this AM, NG tube in place. Has a lot of back and hip pain chronically, thinks that pain medication is part of what upset her stomach. I reviewed the findings that she did not have a WCT yesterday, and she was relieved and thankful. She is due to have a visit with Dr. Lovena Le soon, I told her we can reschedule this for her.  Inpatient Medications    Scheduled Meds: . metoCLOPramide (REGLAN) injection  5 mg Intravenous Q8H   Continuous Infusions: . sodium chloride 75 mL/hr at 08/09/18 0610  . famotidine (PEPCID) IV Stopped (08/09/18 0354)   PRN Meds: acetaminophen **OR** acetaminophen, hydrALAZINE, levalbuterol, LORazepam   Vital Signs    Vitals:   08/09/18 0033 08/09/18 0237 08/09/18 0419 08/09/18 0800  BP:  (!) 183/66 (!) 149/64 (!) 142/59  Pulse:  (!) 58 (!) 59 61  Resp:  16 18 18   Temp: 97.7 F (36.5 C) (!) 97.5 F (36.4 C) 98 F (36.7 C) (!) 97.4 F (36.3 C)  TempSrc: Oral Oral Oral Oral  SpO2:  97% 97% 97%  Weight:  72.1 kg    Height:  5\' 3"  (1.6 m)      Intake/Output Summary (Last 24 hours) at 08/09/2018 6387 Last data filed at 08/09/2018 0700 Gross per 24 hour  Intake 4614.36 ml  Output 200 ml  Net 4414.36 ml   Last 3 Weights 08/09/2018 08/08/2018 07/19/2018  Weight (lbs) 159 lb 155 lb 156 lb 1.4 oz  Weight (kg) 72.122 kg 70.308 kg 70.8 kg      Telemetry    Atrial fib with intermittent V pacing. Lots of artifact - Personally Reviewed  ECG    Yesterday in ER atrial fib with V pacing. - Personally Reviewed  Physical Exam   GEN: Appears uncomfortable   Neck: No JVD Cardiac: largely RRR, no murmurs, rubs, or gallops.  Respiratory: Clear to auscultation bilaterally. GI: NG tube in place with scant dark black material in tube.  MS: No edema; No deformity. Neuro:  Nonfocal  Psych: Normal affect    Labs    Chemistry Recent Labs  Lab 08/08/18 2131 08/09/18 0114  NA 122* 119*  K 4.0 3.6  CL 83* 84*  CO2 25 23  GLUCOSE 111* 138*  BUN 15 16  CREATININE 0.92 0.89  CALCIUM 9.9 9.0  PROT 6.9  --   ALBUMIN 3.9  --   AST 21  --   ALT 16  --   ALKPHOS 107  --   BILITOT 0.8  --   GFRNONAA 55* 57*  GFRAA >60 >60  ANIONGAP 14 12     Hematology Recent Labs  Lab 08/08/18 2131 08/09/18 0736  WBC 9.2 11.6*  RBC 4.09 3.68*  HGB 11.3* 10.4*  HCT 34.0* 30.3*  MCV 83.1 82.3  MCH 27.6 28.3  MCHC 33.2 34.3  RDW 13.9 14.0  PLT 350 330    Cardiac Enzymes Recent Labs  Lab 08/08/18 2131 08/09/18 0114  TROPONINI <0.03 <0.03   No results for input(s): TROPIPOC in the last 168 hours.   BNP Recent Labs  Lab 08/09/18 0114  BNP 309.9*     DDimer No results for input(s): DDIMER in the last 168 hours.   Radiology    Ct Abdomen Pelvis W Contrast  Result Date: 08/09/2018 CLINICAL DATA:  85 year old female with abdominal distention,  nausea vomiting and epigastric pain. EXAM: CT ABDOMEN AND PELVIS WITH CONTRAST TECHNIQUE: Multidetector CT imaging of the abdomen and pelvis was performed using the standard protocol following bolus administration of intravenous contrast. CONTRAST:  165mL OMNIPAQUE IOHEXOL 300 MG/ML  SOLN COMPARISON:  None. FINDINGS: Lower chest: There are bibasilar atelectatic changes. The visualized lung bases are otherwise clear. There is mild cardiomegaly and coronary vascular calcification. A cardiac pacemaker wire is noted. No intra-abdominal free air or free fluid. Hepatobiliary: The liver is unremarkable. Cholecystectomy. No calcified stone noted in the central CBD. Pancreas: Unremarkable. No pancreatic ductal dilatation or surrounding inflammatory changes. Spleen: Normal in size without focal abnormality. Adrenals/Urinary Tract: The adrenal glands are unremarkable. There is no hydronephrosis on either side. There is symmetric enhancement and excretion of  contrast by both kidneys. There is a 2.5 x 3.7 cm high attenuating exophytic lesion in the inferior pole of the left kidney which does not demonstrate significant attenuation difference in delayed images and most likely represents a proteinaceous or hemorrhagic cyst. Further evaluation with ultrasound on a nonemergent basis recommended. Several additional cysts and smaller hypodense lesions which are too small to characterize noted. The visualized ureters and urinary bladder appear unremarkable. Stomach/Bowel: There is a small hiatal hernia. Fluid within the distal esophagus, likely related to reflux. There is no bowel obstruction. There is sigmoid diverticulosis. Moderate stool noted throughout the colon. A 19 x 14 mm polypoid appearing lesion in the right posterior rectal wall (series 9, image 91) may represent a fold or a soft tissue lesion. Further evaluation with rectal exam recommended. Vascular/Lymphatic: There is advanced aortoiliac atherosclerotic disease. Apparent areas of intraluminal low attenuation involving the common femoral vein bilaterally (series 9, image 83) most likely artifactual. DVT is considered less likely. Duplex ultrasound can provide better evaluation if there is high clinical concern for DVT. There is no adenopathy. Reproductive: Hysterectomy. No pelvic mass. Other: None Musculoskeletal: Right hip arthroplasty. Osteopenia with degenerative changes of the spine. No acute osseous pathology. IMPRESSION: 1. No acute intra-abdominal or pelvic pathology. 2. Sigmoid diverticulosis. No bowel obstruction or active inflammation. 3. A 19 x 14 mm polypoid lesion in the right posterior rectal wall, likely a fold or a soft tissue lesion. Further evaluation with rectal exam recommended. 4. High attenuating exophytic left renal inferior pole lesion, likely a complex cyst. Further evaluation with ultrasound on a nonemergent basis recommended. Electronically Signed   By: Anner Crete M.D.   On:  08/09/2018 00:14   Dg Chest Portable 1 View  Result Date: 08/08/2018 CLINICAL DATA:  Tachycardia EXAM: PORTABLE CHEST 1 VIEW COMPARISON:  July 18, 2018 FINDINGS: There is scarring and atelectasis in the left base. Lungs elsewhere are clear. Heart is mildly enlarged with pulmonary vascularity. Pacemaker lead is attached to the right ventricle. There is aortic atherosclerosis. No adenopathy. There is degenerative change in each shoulder. IMPRESSION: Scarring and atelectasis in left lung base. Lungs elsewhere clear. Stable cardiac prominence. Stable pacemaker lead positioning. Aortic Atherosclerosis (ICD10-I70.0). Electronically Signed   By: Lowella Grip III M.D.   On: 08/08/2018 21:47    Cardiac Studies   Prior echo from 07/19/18 personally reviewed.  Patient Profile     85 y.o. female with PMH HTN, permanent atrial fibrillation on coumadin, SSS s/p permanent PM ([employer]) who presented with nausea/emesis. Concern for WCT yesterday in ER.  Assessment & Plan    Permanent atrial fib, SSS s/p PPM, concern for WCT yesterday: -her telemetry is afib with intermittent V pacing. There  is a LOT of artifact on her telemetry, and some of her leads are intermittently dislodged. There is no arrhythmia on her telemetry. -[employer] rep confirmed that there was no ectopy beyond her permanent afib on her pacemaker, including yesterday's event -Review of telemetry images support this event as artifact--there are clear QRS complexes that match with the pulse ox rate. The lead strips have artifact that is mistaken for wide QRS complexes.  Overall NO arrhythmia noted on her telemetry.   Agree with admission for management of hyponatremia. Otherwise electrolyets appear stable. No indication to trend troponins further at this time as they have been negative x3.  [facility] will sign off.   Medication Recommendations:  Once nausea/hyponatremia evaluation complete, would restart home meds, except for  triamterene-HCTZ (would hold given hyponatremia). Other recommendations (labs, testing, etc):  none Follow up as an outpatient:  We will reschedule her virtual visit with Dr. Lovena Le.  For questions or updates, please contact [place] Please consult [email or web address] for contact info under    Signed, Buford Dresser, MD  08/09/2018, 8:22 AM

## 2018-08-10 ENCOUNTER — Telehealth: Payer: Medicare Other | Admitting: Internal Medicine

## 2018-08-10 DIAGNOSIS — E0590 Thyrotoxicosis, unspecified without thyrotoxic crisis or storm: Secondary | ICD-10-CM

## 2018-08-10 LAB — BASIC METABOLIC PANEL
Anion gap: 12 (ref 5–15)
BUN: 14 mg/dL (ref 8–23)
CO2: 23 mmol/L (ref 22–32)
Calcium: 8.8 mg/dL — ABNORMAL LOW (ref 8.9–10.3)
Chloride: 88 mmol/L — ABNORMAL LOW (ref 98–111)
Creatinine, Ser: 0.9 mg/dL (ref 0.44–1.00)
GFR calc Af Amer: >60 mL/min (ref >60)
GFR calc non Af Amer: 56 mL/min — ABNORMAL LOW (ref >60)
Glucose, Bld: 82 mg/dL (ref 70–99)
Potassium: 4.7 mmol/L (ref 3.5–5.1)
Sodium: 123 mmol/L — ABNORMAL LOW (ref 135–145)

## 2018-08-10 LAB — URINE CULTURE: Culture: <10000 — AB

## 2018-08-10 LAB — PROTIME-INR
INR: 2.6 — ABNORMAL HIGH (ref 0.8–1.2)
Prothrombin Time: 27.3 seconds — ABNORMAL HIGH (ref 11.4–15.2)

## 2018-08-10 LAB — GLUCOSE, CAPILLARY: Glucose-Capillary: 91 mg/dL (ref 70–99)

## 2018-08-10 LAB — SODIUM
Sodium: 124 mmol/L — ABNORMAL LOW (ref 135–145)
Sodium: 125 mmol/L — ABNORMAL LOW (ref 135–145)

## 2018-08-10 LAB — MAGNESIUM: Magnesium: 1.9 mg/dL (ref 1.7–2.4)

## 2018-08-10 LAB — TSH: TSH: 6.849 u[IU]/mL — ABNORMAL HIGH (ref 0.350–4.500)

## 2018-08-10 LAB — CORTISOL-AM, BLOOD: Cortisol - AM: 20 ug/dL (ref 6.7–22.6)

## 2018-08-10 MED ORDER — FAMOTIDINE 20 MG PO TABS
20.0000 mg | ORAL_TABLET | Freq: Two times a day (BID) | ORAL | Status: DC
  Administered 2018-08-10 – 2018-08-11 (×2): 20 mg via ORAL
  Filled 2018-08-10 (×2): qty 1

## 2018-08-10 MED ORDER — WARFARIN SODIUM 2 MG PO TABS
2.0000 mg | ORAL_TABLET | ORAL | Status: DC
  Administered 2018-08-10: 18:00:00 2 mg via ORAL
  Filled 2018-08-10: qty 1

## 2018-08-10 MED ORDER — ONDANSETRON HCL 4 MG/2ML IJ SOLN
4.0000 mg | Freq: Three times a day (TID) | INTRAMUSCULAR | Status: DC | PRN
  Administered 2018-08-10: 12:00:00 4 mg via INTRAVENOUS
  Filled 2018-08-10: qty 2

## 2018-08-10 MED ORDER — WARFARIN SODIUM 4 MG PO TABS: 4.0000 mg | ORAL_TABLET | ORAL | Status: DC

## 2018-08-10 NOTE — Evaluation (Addendum)
Physical Therapy Evaluation Patient Details Name: [name] MRN: [id number] DOB: Mar 10, 1926 Today's Date: 08/10/2018   History of Present Illness  85 y.o. female admitted on 08/09/18 for N/V and abdominal pain.  CT of abdomen/pelvis without acute finding and NGT placed overnight (and d/c).  Found to have electrolyte imbalance and constapation.  Pt with other significant PMH of SOB, pacemaker, HTN, A-fib, CA skin, brady cardia, R THA, L4/5 microdiscectomy, and bil TKA.    Clinical Impression  Pt was able to walk around her room several times with RW and min guard assist.  She is a bit unsteady, but reports she uses a cane for ambulation at home.  PT to bring cane next session to assist how steady she looks with a lesser AD (we used RW today as it was available).  She has had home therapy in the past and may benefit from it again at discharge.  I have asked OT to assess her as she does her own ADLs at home.   PT to follow acutely for deficits listed below.      Follow Up Recommendations Home health PT    Equipment Recommendations  None recommended by PT    Recommendations for Other Services OT consult     Precautions / Restrictions Precautions Precautions: Fall Precaution Comments: mildly unsteady on her feet.       Mobility  Bed Mobility Overal bed mobility: Modified Independent                Transfers Overall transfer level: Needs assistance Equipment used: Rolling walker (2 wheeled) Transfers: Sit to/from Stand Sit to Stand: Supervision         General transfer comment: supervision for safety, cues for safe hand placement and RW use.   Ambulation/Gait Ambulation/Gait assistance: Min guard Gait Distance (Feet): 35 Feet Assistive device: Rolling walker (2 wheeled) Gait Pattern/deviations: Step-through pattern;Staggering left;Staggering right     General Gait Details: Pt with mildly staggering gait pattern, normally uses a cane, but currently using a RW as  it was available.  Cues for safe use of RW (no parking and keep it on the ground while turning).  Will try gait with cane next session as it is clear she is not used to using a RW.         Balance Overall balance assessment: Needs assistance Sitting-balance support: Feet supported;No upper extremity supported Sitting balance-Leahy Scale: Good     Standing balance support: Bilateral upper extremity supported;No upper extremity supported;Single extremity supported Standing balance-Leahy Scale: Fair Standing balance comment: can stand with close supervision, but prefers one hand stabilizing on a support object.                              08/10/18 1700  General Exercises - Lower Extremity  Long Arc Quad AROM;Both;10 reps  Hip Flexion/Marching AROM;Both;10 reps  Toe Raises AROM;Both;10 reps  Heel Raises AROM;Both;10 reps     08/10/18 1700  General Exercises - Upper Extremity  Shoulder Flexion AROM;Both;20 reps  Elbow Flexion AROM;Both;10 reps     Pertinent Vitals/Pain Pain Assessment: Faces Faces Pain Scale: Hurts even more Pain Location: L hip Pain Descriptors / Indicators: Aching Pain Intervention(s): Limited activity within patient's tolerance;Monitored during session;Repositioned    Home Living Family/patient expects to be discharged to:: Private residence Living Arrangements: Alone;Other (Comment)(children live very close) Available Help at Discharge: Family;Available PRN/intermittently Type of Home: House Home Access: Stairs to enter  Entrance Stairs-Rails: None Entrance Stairs-Number of Steps: 2 Home Layout: One level;Laundry or work area in basement   Additional Comments: Did drive until she "got sick" last week    Prior Function Level of Independence: Needs assistance   Gait / Transfers Assistance Needed: uses a cane for gait both inside the home and for community access.   ADL's / Homemaking Assistance Needed: granddaughter does the cleaning,  others cook  Comments: likes to get her friend and go shopping at the mall, likes to go to church and do activities with the church.       Hand Dominance   Dominant Hand: Right    Extremity/Trunk Assessment   Upper Extremity Assessment Upper Extremity Assessment: Defer to OT evaluation    Lower Extremity Assessment Lower Extremity Assessment: LLE deficits/detail LLE Deficits / Details: left leg is mildly weaker (3+/5) than right leg, pt reports her left hip "needs to be replaced, but I doubt anyone will do it"    Cervical / Trunk Assessment Cervical / Trunk Assessment: Kyphotic;Other exceptions Cervical / Trunk Exceptions: h/o lumbar spine surgery  Communication   Communication: HOH  Cognition Arousal/Alertness: Awake/alert Behavior During Therapy: WFL for tasks assessed/performed Overall Cognitive Status: Within Functional Limits for tasks assessed                                        General Comments General comments (skin integrity, edema, etc.): VSS throguhout on RA during gait, no reports of nausea or lightheadedness.         Assessment/Plan    PT Assessment Patient needs continued PT services  PT Problem List Decreased activity tolerance;Decreased balance;Decreased mobility;Decreased knowledge of use of DME;Decreased knowledge of precautions;Pain       PT Treatment Interventions DME instruction;Gait training;Stair training;Functional mobility training;Therapeutic activities;Therapeutic exercise;Balance training;Patient/family education    PT Goals (Current goals can be found in the Care Plan section)  Acute Rehab PT Goals Patient Stated Goal: wants to get home, and back to normal PT Goal Formulation: With patient Time For Goal Achievement: 08/24/18 Potential to Achieve Goals: Good    Frequency Min 3X/week          AM-PAC PT "6 Clicks" Mobility  Outcome Measure Help needed turning from your back to your side while in a flat bed  without using bedrails?: None Help needed moving from lying on your back to sitting on the side of a flat bed without using bedrails?: None Help needed moving to and from a bed to a chair (including a wheelchair)?: A Little Help needed standing up from a chair using your arms (e.g., wheelchair or bedside chair)?: A Little Help needed to walk in hospital room?: A Little Help needed climbing 3-5 steps with a railing? : A Little 6 Click Score: 20    End of Session   Activity Tolerance: Patient tolerated treatment well Patient left: in chair;Other (comment)(on BSC with call bell handy) Nurse Communication: Mobility status;Other (comment)(told RN tech pt was on [facility] and would call) PT Visit Diagnosis: Muscle weakness (generalized) (M62.81);Difficulty in walking, not elsewhere classified (R26.2);Unsteadiness on feet (R26.81)    Time: 2993-7169 PT Time Calculation (min) (ACUTE ONLY): 25 min   Charges:          Wells Guiles B. Deyton Ellenbecker, PT, DPT  Acute Rehabilitation #[phone] pager #[phone] office   PT Evaluation $PT Eval Moderate Complexity: 1 Mod PT Treatments $Gait  Training: 8-22 mins        08/10/2018, 5:05 PM

## 2018-08-10 NOTE — Progress Notes (Signed)
[facility] Progress Note   Subjective:   Feeling better, hungry for more than clears.  No further nausea.  No H/o.  Diplopia not a current issue. No I/Os, tells me she's wearing depends and urinating a lot.  Objective Vitals:   08/09/18 0800 08/09/18 1250 08/09/18 2042 08/10/18 0632  BP: (!) 142/59 (!) 154/66 (!) 143/54 130/63  Pulse: 61 60 (!) 59 60  Resp: 18  17 (!) 23  Temp: (!) 97.4 F (36.3 C) (!) 97.5 F (36.4 C) 98.3 F (36.8 C) 98 F (36.7 C)  TempSrc: Oral Oral Oral Oral  SpO2: 97% 100% 95% 97%  Weight:    72.8 kg  Height:       Physical Exam General: sitting up comfortably in bed Heart: RRR Lungs: clear Abdomen: soft Extremities: no edema Neuro:  Normally conversant, seems more sharp than yesterday.   Additional Objective Labs: Basic Metabolic Panel: Recent Labs  Lab 08/09/18 0114 08/09/18 0736  08/09/18 1837 08/09/18 2320 08/10/18 0607  NA 119* 119*   < > 122* 120* 123*  K 3.6 3.5  --   --   --  4.7  CL 84* 86*  --   --   --  88*  CO2 23 23  --   --   --  23  GLUCOSE 138* 123*  --   --   --  82  BUN 16 15  --   --   --  14  CREATININE 0.89 0.85  --   --   --  0.90  CALCIUM 9.0 8.5*  --   --   --  8.8*   < > = values in this interval not displayed.   Liver Function Tests: Recent Labs  Lab 08/08/18 2131  AST 21  ALT 16  ALKPHOS 107  BILITOT 0.8  PROT 6.9  ALBUMIN 3.9   Recent Labs  Lab 08/09/18 0114  LIPASE 40   CBC: Recent Labs  Lab 08/08/18 2131 08/09/18 0736  WBC 9.2 11.6*  NEUTROABS 7.4  --   HGB 11.3* 10.4*  HCT 34.0* 30.3*  MCV 83.1 82.3  PLT 350 330   Blood Culture    Component Value Date/Time   SDES URINE, CLEAN CATCH 08/09/2018 0901   SPECREQUEST NONE 08/09/2018 0901   CULT (A) 08/09/2018 0901    <10,000 COLONIES/mL INSIGNIFICANT GROWTH Performed at [facility], [place] [address] [place], [place] [address]    REPTSTATUS 08/10/2018 FINAL 08/09/2018 0901    Cardiac Enzymes: Recent Labs   Lab 08/08/18 2131 08/09/18 0114 08/09/18 0736 08/09/18 1148  TROPONINI <0.03 <0.03 <0.03 <0.03   CBG: Recent Labs  Lab 08/10/18 0754  GLUCAP 91   Iron Studies: No results for input(s): IRON, TIBC, TRANSFERRIN, FERRITIN in the last 72 hours. @lablastinr3 @ Studies/Results: Ct Abdomen Pelvis W Contrast  Result Date: 08/09/2018 CLINICAL DATA:  85 year old female with abdominal distention, nausea vomiting and epigastric pain. EXAM: CT ABDOMEN AND PELVIS WITH CONTRAST TECHNIQUE: Multidetector CT imaging of the abdomen and pelvis was performed using the standard protocol following bolus administration of intravenous contrast. CONTRAST:  136mL OMNIPAQUE IOHEXOL 300 MG/ML  SOLN COMPARISON:  None. FINDINGS: Lower chest: There are bibasilar atelectatic changes. The visualized lung bases are otherwise clear. There is mild cardiomegaly and coronary vascular calcification. A cardiac pacemaker wire is noted. No intra-abdominal free air or free fluid. Hepatobiliary: The liver is unremarkable. Cholecystectomy. No calcified stone noted in the central CBD. Pancreas: Unremarkable. No pancreatic ductal  dilatation or surrounding inflammatory changes. Spleen: Normal in size without focal abnormality. Adrenals/Urinary Tract: The adrenal glands are unremarkable. There is no hydronephrosis on either side. There is symmetric enhancement and excretion of contrast by both kidneys. There is a 2.5 x 3.7 cm high attenuating exophytic lesion in the inferior pole of the left kidney which does not demonstrate significant attenuation difference in delayed images and most likely represents a proteinaceous or hemorrhagic cyst. Further evaluation with ultrasound on a nonemergent basis recommended. Several additional cysts and smaller hypodense lesions which are too small to characterize noted. The visualized ureters and urinary bladder appear unremarkable. Stomach/Bowel: There is a small hiatal hernia. Fluid within the distal  esophagus, likely related to reflux. There is no bowel obstruction. There is sigmoid diverticulosis. Moderate stool noted throughout the colon. A 19 x 14 mm polypoid appearing lesion in the right posterior rectal wall (series 9, image 91) may represent a fold or a soft tissue lesion. Further evaluation with rectal exam recommended. Vascular/Lymphatic: There is advanced aortoiliac atherosclerotic disease. Apparent areas of intraluminal low attenuation involving the common femoral vein bilaterally (series 9, image 83) most likely artifactual. DVT is considered less likely. Duplex ultrasound can provide better evaluation if there is high clinical concern for DVT. There is no adenopathy. Reproductive: Hysterectomy. No pelvic mass. Other: None Musculoskeletal: Right hip arthroplasty. Osteopenia with degenerative changes of the spine. No acute osseous pathology. IMPRESSION: 1. No acute intra-abdominal or pelvic pathology. 2. Sigmoid diverticulosis. No bowel obstruction or active inflammation. 3. A 19 x 14 mm polypoid lesion in the right posterior rectal wall, likely a fold or a soft tissue lesion. Further evaluation with rectal exam recommended. 4. High attenuating exophytic left renal inferior pole lesion, likely a complex cyst. Further evaluation with ultrasound on a nonemergent basis recommended. Electronically Signed   By: Anner Crete M.D.   On: 08/09/2018 00:14   Dg Chest Portable 1 View  Result Date: 08/08/2018 CLINICAL DATA:  Tachycardia EXAM: PORTABLE CHEST 1 VIEW COMPARISON:  July 18, 2018 FINDINGS: There is scarring and atelectasis in the left base. Lungs elsewhere are clear. Heart is mildly enlarged with pulmonary vascularity. Pacemaker lead is attached to the right ventricle. There is aortic atherosclerosis. No adenopathy. There is degenerative change in each shoulder. IMPRESSION: Scarring and atelectasis in left lung base. Lungs elsewhere clear. Stable cardiac prominence. Stable pacemaker lead  positioning. Aortic Atherosclerosis (ICD10-I70.0). Electronically Signed   By: Lowella Grip III M.D.   On: 08/08/2018 21:47   Medications: . famotidine (PEPCID) IV Stopped (08/09/18 2232)   . amLODipine  10 mg Oral q morning - 10a  . clonazePAM  0.5 mg Oral QHS  . feeding supplement (ENSURE ENLIVE)  237 mL Oral BID BM  . metoCLOPramide (REGLAN) injection  5 mg Intravenous Q8H  . polyethylene glycol  17 g Oral BID  . potassium chloride SA  20 mEq Oral BID  . pravastatin  40 mg Oral q morning - 10a  . Warfarin - Pharmacist Dosing Inpatient   Does not apply q1800   :  Assessment/Plan **Hypotonic hyponatremia:  Review of historic labs shows longstanding mild to moderate hyponatremia with serum Na generally 125-132 dating to 2014.  On presentation yesterday Na 122 on background of 4 days of N/V.  Uosm 288 on 07/19/18 but was 473 this AM.  Despite 4.6L of isotonic fluids overnighther serum sodium is now 119; UOP was not documented.   Based on history she has elements of thiazide induced, SIADH and/or  low solute intake hyponatremia.  Events in recent past would be consistent with hypovolemic hyponatremia on top of chronic issues.  Serum sodium worse despite volume resuscitation. Yesterday we elected to hold further IVF, fluid restrict, add protein supplement to diet.    Her serum sodium has improved to 123 this AM and her nausea (which I think preceded the drop in Na) has improved.    --Hold on IVF  --Protein supplement, advance po diet --Fluid restrict to 1533mL/day --Agree with q6h serum sodium today.  --pending AM cortisol, TSH --Hold diuretics now AND at discharge given chronic issue --Strict I/Os (wearing diapers so hasn't collected), daily weights  **HTN: recent BP 130/63, hold diuretic due to hypoNa, cont other meds.    **Exophytic renal cyst: noted on admission CT.  Will need renal US to follow up, preferably after discharge.   Please page anytime with questions or  concerns.  Additionally page with clinical status changes.  Jannifer Hick MD 08/10/2018, 8:08 AM  [facility] Pager: [phone]

## 2018-08-10 NOTE — Discharge Instructions (Signed)

## 2018-08-10 NOTE — Progress Notes (Signed)
PROGRESS NOTE  [name] GGE:[id number] DOB: 1925-06-16 DOA: 08/08/2018 PCP: [name], MD   LOS: 1 day   Patient is from: home  Brief Narrative / Interim history: 85 year old female with history of hypertension, hyperlipidemia, permanent atrial fibrillation status post pacemaker and on Coumadin, hypothyroidism ID presenting with intractable nausea and vomiting for 4 days.   In the ED, bradycardic.  CBC not impressive.  Sodium 122.  Troponin negative.  INR 2.0.  COVID test negative.  CXR without acute finding.  CT abdomen and pelvis without acute finding.  Admitted for intractable nausea and vomiting.  NG tube placed.  Reportedly had wide-complex ventricular rhythm in route to ED per EMS report to EDP and was given 2 g of magnesium sulfate and cardiology consulted.  After reviewing telemetry, EKG and talking to her [facility] representative, cardiology does not believe she had arrhythmia.  Subjective: Events overnight of this morning.  NG tube discontinued on 4/13.  Tolerated clear liquid diet.  Has had little nausea this morning but no emesis.  She says she had bowel movement yesterday.  She denies melena or hematochezia.  Denies chest pain, palpitation, dyspnea or abdominal pain.  Denies urinary symptoms.   Assessment & Plan: Intractable nausea and vomiting: Resolving. CT abdomen and pelvis not impressive except for moderate stool burden.  She has significant hyponatremia that has gotten worse with IV normal saline.  Lipase and LFT within normal range.  Gastric content and stool Hemoccult negative. -Discontinued NG tube 5/13.  No signs of SBO. -Pepcid and antiemetics as needed -IV fluid discontinued due to hyponatremia likely from SIADH. -Constipation resolved with enema and bowel regimen. -Advance diet as tolerated from full liquid to regular. -PRN Zofran for nausea.  Hyponatremia:  Urine chemistry and worsening with NS suggestive for SIADH.  She is also on ARB and  Maxzide which could contribute.  Uric acid, a.m. and cortisol within normal range.  TSH mildly elevated. -Sodium 122--> 119-->123--> -Holding ARB and Maxzide -Appreciate nephrology-fluid restriction -Monitor sodium every 6. -Monitor electrolytes daily.  Anemia of chronic disease: Slight drop in hemoglobin likely dilutional -We will check FOBT and gastric content Hemoccult  Hyperthyroidism: TSH mildly elevated. -Continue holding methimazole  Hypokalemia/hypomagnesemia: Likely due to GI loss -Replenish and recheck  Permanent atrial fibrillation/SSS/s/p PPM, concern for WCT yesterday -Appreciate cardiology input, -Likely artifact mistaken for wide QRS complexes. -[employer] representative confirmed that there was no ectopy -Resume warfarin per pharmacy  Anxiety: Stable -Continue home medications  Constipation: Resolved with enema and bowel regimen.  Bilateral groin pain: Likely due to osteoarthritis. -PRN fentanyl.  History of opiate intolerance  Exophytic renal cyst: Noted on CT abdomen -Outpatient renal ultrasound  Hypertension: BP fairly controlled -Hold Maxzide and continue other medications  Scheduled Meds: . amLODipine  10 mg Oral q morning - 10a  . clonazePAM  0.5 mg Oral QHS  . famotidine  20 mg Oral BID  . feeding supplement (ENSURE ENLIVE)  237 mL Oral BID BM  . metoCLOPramide (REGLAN) injection  5 mg Intravenous Q8H  . polyethylene glycol  17 g Oral BID  . potassium chloride SA  20 mEq Oral BID  . pravastatin  40 mg Oral q morning - 10a  . warfarin  2 mg Oral Q T,Th,S,Su-1800  . [START ON 08/11/2018] warfarin  4 mg Oral Q M,W,F-1800  . Warfarin - Pharmacist Dosing Inpatient   Does not apply q1800   Continuous Infusions:  PRN Meds:.acetaminophen **OR** acetaminophen, fentaNYL (SUBLIMAZE) injection, hydrALAZINE,  levalbuterol, LORazepam, ondansetron (ZOFRAN) IV   DVT prophylaxis: Warfarin Code Status: Full code Family Communication: Attempted to call  patient's son but no response.  Did not leave voicemail. Disposition Plan: Remains inpatient pending improvement in hyponatremia and p.o. tolerance.  Consultants:   Nephrology  Cardiology  Procedures:   NG tube 5/12-5/13  Microbiology: . COVID-19 negative  Antimicrobials: Anti-infectives (From admission, onward)   None      Objective: Vitals:   08/09/18 2042 08/10/18 0632 08/10/18 0943 08/10/18 1223  BP: (!) 143/54 130/63 (!) 116/101 (!) 120/45  Pulse: (!) 59 60 60 60  Resp: 17 (!) 23  18  Temp: 98.3 F (36.8 C) 98 F (36.7 C)  98.1 F (36.7 C)  TempSrc: Oral Oral  Oral  SpO2: 95% 97% 100% 99%  Weight:  72.8 kg    Height:        Intake/Output Summary (Last 24 hours) at 08/10/2018 1428 Last data filed at 08/10/2018 0930 Gross per 24 hour  Intake 1317.11 ml  Output 100 ml  Net 1217.11 ml   Filed Weights   08/08/18 2141 08/09/18 0237 08/10/18 3546  Weight: 70.3 kg 72.1 kg 72.8 kg    Examination:  GENERAL: No acute distress.  Appears well.  HEENT: MMM.  Vision and hearing grossly intact.  NECK: Supple.  No JVD.  LUNGS:  No IWOB. Good air movement bilaterally. HEART:  RRR. Heart sounds normal.  ABD: Bowel sounds present. Soft. Non tender.  MSK/EXT:  Moves all extremities. No apparent deformity. No edema bilaterally.  SKIN: no apparent skin lesion or wound NEURO: Awake, alert and oriented appropriately.  No gross deficit.  PSYCH: Calm. Normal affect.  Data Reviewed: I have independently reviewed following labs and imaging studies  CBC: Recent Labs  Lab 08/08/18 2131 08/09/18 0736  WBC 9.2 11.6*  NEUTROABS 7.4  --   HGB 11.3* 10.4*  HCT 34.0* 30.3*  MCV 83.1 82.3  PLT 350 568   Basic Metabolic Panel: Recent Labs  Lab 08/08/18 2131 08/09/18 0114 08/09/18 0736 08/09/18 1148 08/09/18 1308 08/09/18 1837 08/09/18 2320 08/10/18 0607 08/10/18 1132  NA 122* 119* 119*  --  120* 122* 120* 123* 124*  K 4.0 3.6 3.5  --   --   --   --  4.7  --   CL  83* 84* 86*  --   --   --   --  88*  --   CO2 25 23 23   --   --   --   --  23  --   GLUCOSE 111* 138* 123*  --   --   --   --  82  --   BUN 15 16 15   --   --   --   --  14  --   CREATININE 0.92 0.89 0.85  --   --   --   --  0.90  --   CALCIUM 9.9 9.0 8.5*  --   --   --   --  8.8*  --   MG 1.5*  --   --  1.9  --   --   --  1.9  --    GFR: Estimated Creatinine Clearance: 39.7 mL/min (by C-G formula based on SCr of 0.9 mg/dL). Liver Function Tests: Recent Labs  Lab 08/08/18 2131  AST 21  ALT 16  ALKPHOS 107  BILITOT 0.8  PROT 6.9  ALBUMIN 3.9   Recent Labs  Lab 08/09/18 0114  LIPASE  40   No results for input(s): AMMONIA in the last 168 hours. Coagulation Profile: Recent Labs  Lab 08/08/18 2131 08/10/18 0607  INR 2.0* 2.6*   Cardiac Enzymes: Recent Labs  Lab 08/08/18 2131 08/09/18 0114 08/09/18 0736 08/09/18 1148  TROPONINI <0.03 <0.03 <0.03 <0.03   BNP (last 3 results) No results for input(s): PROBNP in the last 8760 hours. HbA1C: No results for input(s): HGBA1C in the last 72 hours. CBG: Recent Labs  Lab 08/10/18 0754  GLUCAP 91   Lipid Profile: No results for input(s): CHOL, HDL, LDLCALC, TRIG, CHOLHDL, LDLDIRECT in the last 72 hours. Thyroid Function Tests: Recent Labs    08/10/18 0402  TSH 6.849*   Anemia Panel: No results for input(s): VITAMINB12, FOLATE, FERRITIN, TIBC, IRON, RETICCTPCT in the last 72 hours. Urine analysis:    Component Value Date/Time   COLORURINE YELLOW 08/09/2018 0913   APPEARANCEUR CLEAR 08/09/2018 0913   LABSPEC 1.010 08/09/2018 0913   PHURINE 5.5 08/09/2018 0913   GLUCOSEU 100 (A) 08/09/2018 0913   HGBUR TRACE (A) 08/09/2018 0913   BILIRUBINUR NEGATIVE 08/09/2018 0913   KETONESUR NEGATIVE 08/09/2018 0913   PROTEINUR NEGATIVE 08/09/2018 0913   UROBILINOGEN 0.2 11/12/2014 1331   NITRITE NEGATIVE 08/09/2018 0913   LEUKOCYTESUR NEGATIVE 08/09/2018 0913   Sepsis Labs: Invalid input(s): PROCALCITONIN, LACTICIDVEN   Recent Results (from the past 240 hour(s))  SARS Coronavirus 2 (CEPHEID - Performed in [place] hospital lab), Hosp Order     Status: None   Collection Time: 08/08/18  9:38 PM  Result Value Ref Range Status   SARS Coronavirus 2 NEGATIVE NEGATIVE Final    Comment: (NOTE) If result is NEGATIVE SARS-CoV-2 target nucleic acids are NOT DETECTED. The SARS-CoV-2 RNA is generally detectable in upper and lower  respiratory specimens during the acute phase of infection. The lowest  concentration of SARS-CoV-2 viral copies this assay can detect is 250  copies / mL. A negative result does not preclude SARS-CoV-2 infection  and should not be used as the sole basis for treatment or other  patient management decisions.  A negative result may occur with  improper specimen collection / handling, submission of specimen other  than nasopharyngeal swab, presence of viral mutation(s) within the  areas targeted by this assay, and inadequate number of viral copies  (<250 copies / mL). A negative result must be combined with clinical  observations, patient history, and epidemiological information. If result is POSITIVE SARS-CoV-2 target nucleic acids are DETECTED. The SARS-CoV-2 RNA is generally detectable in upper and lower  respiratory specimens dur ing the acute phase of infection.  Positive  results are indicative of active infection with SARS-CoV-2.  Clinical  correlation with patient history and other diagnostic information is  necessary to determine patient infection status.  Positive results do  not rule out bacterial infection or co-infection with other viruses. If result is PRESUMPTIVE POSTIVE SARS-CoV-2 nucleic acids MAY BE PRESENT.   A presumptive positive result was obtained on the submitted specimen  and confirmed on repeat testing.  While 2019 novel coronavirus  (SARS-CoV-2) nucleic acids may be present in the submitted sample  additional confirmatory testing may be necessary for  epidemiological  and / or clinical management purposes  to differentiate between  SARS-CoV-2 and other Sarbecovirus currently known to infect humans.  If clinically indicated additional testing with an alternate test  methodology [phone]) is advised. The SARS-CoV-2 RNA is generally  detectable in upper and lower respiratory sp ecimens during the acute  phase of infection. The expected result is Negative. Fact Sheet for Patients:  StrictlyIdeas.no Fact Sheet for Healthcare Providers: BankingDealers.co.za This test is not yet approved or cleared by the [place] FDA and has been authorized for detection and/or diagnosis of SARS-CoV-2 by FDA under an Emergency Use Authorization (EUA).  This EUA will remain in effect (meaning this test can be used) for the duration of the COVID-19 declaration under Section 564(b)(1) of the Act, 21 U.S.C. section 360bbb-3(b)(1), unless the authorization is terminated or revoked sooner. Performed at [facility], [place] [address] [place], [place] [address]   Urine culture     Status: Abnormal   Collection Time: 08/09/18  9:01 AM  Result Value Ref Range Status   Specimen Description URINE, CLEAN CATCH  Final   Special Requests NONE  Final   Culture (A)  Final    <10,000 COLONIES/mL INSIGNIFICANT GROWTH Performed at [facility], [place] [address], [place], [place] [address]    Report Status 08/10/2018 FINAL  Final      Radiology Studies: No results found.    T. [facility] Pager [phone]  If 7PM-7AM, please contact night-coverage [email or web address] Password [place] 08/10/2018, 2:28 PM

## 2018-08-10 NOTE — Progress Notes (Signed)
ANTICOAGULATION CONSULT NOTE - Follow Up Consult  Pharmacy Consult for Warfarin Indication: atrial fibrillation  Allergies  Allergen Reactions  . Morphine And Related Nausea And Vomiting    Patient Measurements: Height: 5\' 3"  (160 cm) Weight: 160 lb 7.9 oz (72.8 kg) IBW/kg (Calculated) : 52.4  Vital Signs: Temp: 98 F (36.7 C) (05/14 0632) Temp Source: Oral (05/14 9924) BP: 116/101 (05/14 0943) Pulse Rate: 60 (05/14 0943)  Labs: Recent Labs    08/08/18 2131 08/09/18 0114 08/09/18 0736 08/09/18 1148 08/10/18 0607  HGB 11.3*  --  10.4*  --   --   HCT 34.0*  --  30.3*  --   --   PLT 350  --  330  --   --   LABPROT 22.1*  --   --   --  27.3*  INR 2.0*  --   --   --  2.6*  CREATININE 0.92 0.89 0.85  --  0.90  TROPONINI <0.03 <0.03 <0.03 <0.03  --     Estimated Creatinine Clearance: 39.7 mL/min (by C-G formula based on SCr of 0.9 mg/dL).   Assessment:  Anticoag: Warfarin PTA for afib. CHA2DS2-VASc Score is 4. Hgb 11.3>10.4. INR 2.6 - Warfarin 4mg  MWF and 2mg  TTSS  Goal of Therapy:  INR 2-3 Monitor platelets by anticoagulation protocol: Yes   Plan:  Coumadin 4mg  MWF and 2mg  TTSS as PTA IV Pepcid>po    Valborg Friar S. Alford Highland, PharmD, BCPS Clinical Staff Pharmacist Eilene Ghazi Stillinger 08/10/2018,11:02 AM

## 2018-08-11 DIAGNOSIS — M160 Bilateral primary osteoarthritis of hip: Secondary | ICD-10-CM

## 2018-08-11 LAB — COMPREHENSIVE METABOLIC PANEL WITH GFR
ALT: 13 U/L (ref 0–44)
AST: 21 U/L (ref 15–41)
Albumin: 3.3 g/dL — ABNORMAL LOW (ref 3.5–5.0)
Alkaline Phosphatase: 96 U/L (ref 38–126)
Anion gap: 7 (ref 5–15)
BUN: 11 mg/dL (ref 8–23)
CO2: 25 mmol/L (ref 22–32)
Calcium: 8.6 mg/dL — ABNORMAL LOW (ref 8.9–10.3)
Chloride: 95 mmol/L — ABNORMAL LOW (ref 98–111)
Creatinine, Ser: 0.89 mg/dL (ref 0.44–1.00)
GFR calc Af Amer: >60 mL/min (ref >60)
GFR calc non Af Amer: 57 mL/min — ABNORMAL LOW (ref >60)
Glucose, Bld: 96 mg/dL (ref 70–99)
Potassium: 4.1 mmol/L (ref 3.5–5.1)
Sodium: 127 mmol/L — ABNORMAL LOW (ref 135–145)
Total Bilirubin: 0.7 mg/dL (ref 0.3–1.2)
Total Protein: 5.4 g/dL — ABNORMAL LOW (ref 6.5–8.1)

## 2018-08-11 LAB — PROTIME-INR
INR: 3 — ABNORMAL HIGH (ref 0.8–1.2)
Prothrombin Time: 31 s — ABNORMAL HIGH (ref 11.4–15.2)

## 2018-08-11 LAB — HEMOGLOBIN AND HEMATOCRIT, BLOOD
HCT: 30 % — ABNORMAL LOW (ref 36.0–46.0)
Hemoglobin: 9.8 g/dL — ABNORMAL LOW (ref 12.0–15.0)

## 2018-08-11 LAB — GLUCOSE, CAPILLARY: Glucose-Capillary: 96 mg/dL (ref 70–99)

## 2018-08-11 LAB — MAGNESIUM: Magnesium: 1.9 mg/dL (ref 1.7–2.4)

## 2018-08-11 MED ORDER — HYDRALAZINE HCL 25 MG PO TABS
25.0000 mg | ORAL_TABLET | Freq: Three times a day (TID) | ORAL | Status: DC
  Filled 2018-08-11: qty 1

## 2018-08-11 MED ORDER — PREDNISONE 20 MG PO TABS
50.0000 mg | ORAL_TABLET | Freq: Every day | ORAL | Status: DC
  Administered 2018-08-11: 09:00:00 50 mg via ORAL
  Filled 2018-08-11: qty 1

## 2018-08-11 MED ORDER — ENSURE ENLIVE PO LIQD: 237.0000 mL | Freq: Two times a day (BID) | ORAL | 0 refills | Status: DC

## 2018-08-11 MED ORDER — PREDNISONE 10 MG (21) PO TBPK: ORAL_TABLET | ORAL | 0 refills | Status: DC

## 2018-08-11 MED FILL — predniSONE 10 MG (21) TBPK: 10 | 7 days supply | Qty: 21 | Fill #0

## 2018-08-11 NOTE — Progress Notes (Signed)
[facility] Progress Note   Subjective:   Had a 'terrible' night due to hip pain.  Appetite is good.  No nausea, emesis, vision changes, HA.  UOP is normal for her (Not recorded due to incontinent)  Objective Vitals:   08/11/18 0025 08/11/18 0300 08/11/18 0400 08/11/18 0408  BP: (!) 130/52 (!) 169/56  (!) 149/62  Pulse: 60   62  Resp: 18   18  Temp: 98.6 F (37 C)   98 F (36.7 C)  TempSrc: Oral   Oral  SpO2:  95% 96% 95%  Weight:    70.8 kg  Height:       Physical Exam General: lying flat in bed, no distress Heart: RRR Lungs: clear Abdomen: soft Extremities: no edema Neuro:  Normally conversant  Additional Objective Labs: Basic Metabolic Panel: Recent Labs  Lab 08/09/18 0736  08/10/18 0607 08/10/18 1132 08/10/18 1734 08/11/18 0504  NA 119*   < > 123* 124* 125* 127*  K 3.5  --  4.7  --   --  4.1  CL 86*  --  88*  --   --  95*  CO2 23  --  23  --   --  25  GLUCOSE 123*  --  82  --   --  96  BUN 15  --  14  --   --  11  CREATININE 0.85  --  0.90  --   --  0.89  CALCIUM 8.5*  --  8.8*  --   --  8.6*   < > = values in this interval not displayed.   Liver Function Tests: Recent Labs  Lab 08/08/18 2131 08/11/18 0504  AST 21 21  ALT 16 13  ALKPHOS 107 96  BILITOT 0.8 0.7  PROT 6.9 5.4*  ALBUMIN 3.9 3.3*   Recent Labs  Lab 08/09/18 0114  LIPASE 40   CBC: Recent Labs  Lab 08/08/18 2131 08/09/18 0736 08/11/18 0504  WBC 9.2 11.6*  --   NEUTROABS 7.4  --   --   HGB 11.3* 10.4* 9.8*  HCT 34.0* 30.3* 30.0*  MCV 83.1 82.3  --   PLT 350 330  --    Blood Culture    Component Value Date/Time   SDES URINE, CLEAN CATCH 08/09/2018 0901   SPECREQUEST NONE 08/09/2018 0901   CULT (A) 08/09/2018 0901    <10,000 COLONIES/mL INSIGNIFICANT GROWTH Performed at [facility] [address], [place], [place] [address]    REPTSTATUS 08/10/2018 FINAL 08/09/2018 0901    Cardiac Enzymes: Recent Labs  Lab 08/08/18 2131 08/09/18 0114  08/09/18 0736 08/09/18 1148  TROPONINI <0.03 <0.03 <0.03 <0.03   CBG: Recent Labs  Lab 08/10/18 0754 08/11/18 0732  GLUCAP 91 96   Iron Studies: No results for input(s): IRON, TIBC, TRANSFERRIN, FERRITIN in the last 72 hours. @lablastinr3 @ Studies/Results: No results found. Medications:  . amLODipine  10 mg Oral q morning - 10a  . clonazePAM  0.5 mg Oral QHS  . famotidine  20 mg Oral BID  . feeding supplement (ENSURE ENLIVE)  237 mL Oral BID BM  . hydrALAZINE  25 mg Oral Q8H  . metoCLOPramide (REGLAN) injection  5 mg Intravenous Q8H  . potassium chloride SA  20 mEq Oral BID  . pravastatin  40 mg Oral q morning - 10a  . warfarin  2 mg Oral Q T,Th,S,Su-1800  . warfarin  4 mg Oral Q M,W,F-1800  . Warfarin - Pharmacist Dosing  Inpatient   Does not apply q1800   :  Assessment/Plan **Hypotonic hyponatremia:  Review of historic labs shows longstanding mild to moderate hyponatremia with serum Na generally 125-132 dating to 2014.  On presentation 5/12 Na 122 on background of 4 days of N/V.  Uosm 288 on 07/19/18 but was 473 5/13 (After IVF).  Despite 4.6L of isotonic fluids serum sodium dropped to 119; UOP was not documented.   Based on history she has elements of thiazide induced, SIADH and/or low solute intake hyponatremia.  Events in recent past would be consistent with hypovolemic hyponatremia on top of chronic issues.  Serum sodium worse despite volume resuscitation.   We elected to hold on IVF hydration, hold thiazide, promote protein/solute intake with meals and ensure.  Cortisol is normal.  TSH has been mildly elevated and her tapazol dose was decreased in 06/2018.  It is improved but is still mildly elevated.  Her sodium has now trended up to 127 this morning which is within her prior baseline over the years.  I expect it will continue to trend up in light of stopping her thiazide and promoting solute intake.    She is ok for discharge from my perspective.  She should have a  1.5L fluid restriction and be encouraged to continue her normal meals and ensure BID between meals.  She should not be restarted on thiazide.  She can follow up with PCP who has been following her hyponatremia.  If further issues persist I'd be happy to see her in clinic in the future.    **HTN: recent BP 149/62, hold diuretic due to hypoNa, cont other meds.    **Exophytic renal cyst: noted on admission CT.  Will need renal US to follow up, preferably after discharge.   I will sign off, please call me with any questions or concerns.  Jannifer Hick MD 08/11/2018, 7:58 AM  [facility] Pager: [phone]

## 2018-08-11 NOTE — Progress Notes (Signed)
Occupational Therapy Evaluation Patient Details Name: [name] MRN: [id number] DOB: [birth year] Today's Date: 08/11/2018    History of Present Illness 85 y.o. female admitted on 08/09/18 for N/V and abdominal pain.  CT of abdomen/pelvis without acute finding and NGT placed overnight (and d/c).  Found to have electrolyte imbalance and constapation.  Pt with other significant PMH of SOB, pacemaker, HTN, A-fib, CA skin, brady cardia, R THA, L4/5 microdiscectomy, and bil TKA.     Clinical Impression   PTA, pt lives alone and is independent with basic ADLS. Pt requiring min guard assist for functional mobility and ADLs. She c/o left hip pain/discomfort but reports improvement with movement and repositioning.  Pt has family that lives close by (next door and behind her house) that provides frequent check ins and assist with groceries/cooking. Recommending return home with continued support from family. Will continue to follow acutely.     Follow Up Recommendations  No OT follow up;Supervision - Intermittent    Equipment Recommendations  None recommended by OT    Recommendations for Other Services       Precautions / Restrictions Precautions Precautions: Fall Precaution Comments: mildly unsteady on her feet.       Mobility Bed Mobility Overal bed mobility: Needs Assistance Bed Mobility: Supine to Sit;Sit to Supine     Supine to sit: Modified independent (Device/Increase time);HOB elevated Sit to supine: Min assist;HOB elevated   General bed mobility comments: Assist to bring LEs into bed.   Transfers Overall transfer level: Needs assistance Equipment used: Rolling walker (2 wheeled) Transfers: Sit to/from Stand Sit to Stand: Min guard         General transfer comment: verbal cues for safe hand placement    Balance                                           ADL either performed or assessed with clinical judgement   ADL Overall ADL's : Needs  assistance/impaired     Grooming: Wash/dry hands;Wash/dry face;Oral care;Standing;Min guard   Upper Body Bathing: Supervision/ safety;Sitting   Lower Body Bathing: Min guard;Sit to/from stand   Upper Body Dressing : Supervision/safety;Sitting   Lower Body Dressing: Min guard;Sit to/from stand   Toilet Transfer: Min guard;RW   Toileting- Water quality scientist and Hygiene: Min guard;Sit to/from stand       Functional mobility during ADLs: Min guard;Rolling walker General ADL Comments: Verbal cues for safe use of RW during functional mobility within room. Pt ambulated to sink and completed grooming tasks in standing. Don socks sitting EOB without difficulty.     Vision         Perception     Praxis      Pertinent Vitals/Pain Pain Assessment: Faces Faces Pain Scale: Hurts little more Pain Location: L hip Pain Descriptors / Indicators: Aching Pain Intervention(s): Monitored during session;Repositioned     Hand Dominance Right   Extremity/Trunk Assessment Upper Extremity Assessment Upper Extremity Assessment: Overall WFL for tasks assessed   Lower Extremity Assessment Lower Extremity Assessment: Defer to PT evaluation   Cervical / Trunk Assessment Cervical / Trunk Assessment: Kyphotic Cervical / Trunk Exceptions: h/o lumbar spine surgery   Communication Communication Communication: HOH   Cognition Arousal/Alertness: Awake/alert Behavior During Therapy: WFL for tasks assessed/performed Overall Cognitive Status: Within Functional Limits for tasks assessed  General Comments       Exercises     Shoulder Instructions      Home Living Family/patient expects to be discharged to:: Private residence Living Arrangements: Alone;Other (Comment)(children live very close) Available Help at Discharge: Family;Available PRN/intermittently Type of Home: House Home Access: Stairs to enter [employer] of  Steps: 2 Entrance Stairs-Rails: None Home Layout: One level;Laundry or work area in basement     [employer]: Occupational psychologist: [place]: Kasandra Knudsen - single point;Walker - 2 wheels;Shower seat   Additional Comments: Pt reports she usually drives but since [id number] her children driver her and get her groceries.      Prior Functioning/Environment Level of Independence: Needs assistance  Gait / Transfers Assistance Needed: uses a cane for gait both inside the home and for community access.  ADL's / Homemaking Assistance Needed: granddaughter does the cleaning, others cook   Comments: likes to get her friend and go shopping at the mall, likes to go to church and do activities with the church.          OT Problem List: Decreased strength;Decreased activity tolerance;Impaired balance (sitting and/or standing);Pain;Decreased knowledge of use of DME or AE      OT Treatment/Interventions: Self-care/ADL training;DME and/or AE instruction;Therapeutic activities;Patient/family education;Balance training    OT Goals(Current goals can be found in the care plan section) Acute Rehab OT Goals Patient Stated Goal: wants to get home, and back to normal OT Goal Formulation: With patient Time For Goal Achievement: 08/25/18 Potential to Achieve Goals: Good  OT Frequency: Min 2X/week   Barriers to D/C:            Co-evaluation              AM-PAC OT "6 Clicks" Daily Activity     Outcome Measure Help from another person eating meals?: None Help from another person taking care of personal grooming?: A Little Help from another person toileting, which includes using toliet, bedpan, or urinal?: A Little Help from another person bathing (including washing, rinsing, drying)?: A Little Help from another person to put on and taking off regular upper body clothing?: A Little Help from another person to put on and taking off regular lower body clothing?: A  Little 6 Click Score: 19   End of Session Equipment Utilized During Treatment: Rolling walker Nurse Communication: Mobility status  Activity Tolerance: Patient tolerated treatment well Patient left: in bed;with call bell/phone within reach;with bed alarm set  OT Visit Diagnosis: Unsteadiness on feet (R26.81);Pain Pain - Right/Left: Left Pain - part of body: Hip                Time: 8916-9450 OT Time Calculation (min): 25 min Charges:  OT General Charges $OT Visit: 1 Visit OT Evaluation $OT Eval Moderate Complexity: 1 Mod OT Treatments $Self Care/Home Management : 8-22 mins    Darrol Jump OTR/L [place] [phone] 08/11/2018, 9:29 AM

## 2018-08-11 NOTE — Consult Note (Signed)
   [facility] CM Inpatient Consult   08/11/2018  [name] 08/16/1925 [id number]    Patient screened for potential needs for [facility] Care Management services with readmission within 30 days, 2 hospitalizations and 1 ED visit in the past 6 months. She has 21% (medium) unplanned readmission risk score and Medicare insurance benefits.   Chart reviewedand MD narrative on 5/14 shows as follows: Patient is 85 year old female with history of hypertension, hyperlipidemia, permanent atrial fibrillation status post pacemaker and on Coumadin, hypothyroidism ID, who presented from home with intractable nausea and vomiting with abdominal pain. Found to have electrolyte imbalance and constipation. COVID test negative.  Called and spoke with patient over the hospital room phone (HIPAA verified) and she reports living at home alone prior to admission, however, she has support of son that lives next door and another son that lives behind her, as well as daughter in-law who assists with needs like medication management if needed, and her granddaughter helps with house cleaning. She states "I have plenty of help and not able to do anything without them knowing", and her family provides transportation whenever she needs it. She denies any needs or concerns at this point.  Transition of care CM note reviewed and noted, PT has recommended for Home Health PT and patient chose [facility]. Has no issues with getting medications using [facility] and [place] (will be delivering medications to her room). Has no further home needs identified at this time. Her primary care provider is Dr. Domenick Gong with [facility], listed as providing transition of care.  Of note, [facility] Care Management services does not replace or interfere with any services that are arranged bytransition of care CMor social work.    Patient agreed she will benefit from [facility] calls to follow-up her  continued recovery.   Referral made for [facility] calls post discharge.   For questions and additional information, please call:  Francile Woolford A. Yukio Bisping, BSN, RN-BC [facility] Liaison Cell: [phone]

## 2018-08-11 NOTE — Discharge Summary (Signed)
Physician Discharge Summary  [name]:[id number] DOB: Aug 20, 1925 DOA: 08/08/2018  PCP: [name], MD  Admit date: 08/08/2018 Discharge date: 08/11/2018  Admitted From: Home Disposition: Home  Recommendations for Outpatient Follow-up:  1. Follow up with PCP in 1-2 weeks 2. Please obtain CBC/BMP/Mag at follow up 3. Please follow up on the following pending results: None  Home Health: PT Equipment/Devices: Patient has a walker at home  Discharge Condition: Stable CODE STATUS: Full code  Hospital Course: 85 year old female with history of hypertension, hyperlipidemia, permanent atrial fibrillation status post pacemaker and on Coumadin, hypothyroidism ID presenting with intractable nausea and vomiting for 4 days.   In the ED, bradycardic.  CBC not impressive.  Sodium 122.  Troponin negative.  INR 2.0.  COVID test negative.  CXR without acute finding.  CT abdomen and pelvis without acute finding.  Admitted for intractable nausea and vomiting.  NG tube placed.  Reportedly had wide-complex ventricular rhythm in route to ED per EMS report to EDP and was given 2 g of magnesium sulfate and cardiology consulted.  After reviewing telemetry, EKG and talking to her [facility], cardiology didn't think she had arrhythmia.  See individual problem list below for more.  Discharge Diagnoses:  Intractable nausea and vomiting:  Resolved.. CT abdomen and pelvis not impressive except for moderate stool burden.  She has significant hyponatremia that has gotten worse with IV normal saline.  Lipase and LFT within normal range.  Gastric content and stool Hemoccult negative. -Discontinued NG tube 5/13.  No signs of SBO. -Pepcid and antiemetics as needed -IV fluid discontinued due to hyponatremia likely from SIADH. -Constipation resolved with enema and bowel regimen. -Advance diet as tolerated from full liquid to regular diet she tolerated. -PRN Zofran for nausea.   Hyponatremia:  Urine chemistry and worsening with NS suggestive for SIADH.  She is also on Maxzide which could contribute.  Uric acid, a.m. and cortisol within normal range.  TSH mildly elevated. -Sodium 122--> 119-->123--> 127 (baseline 125-133) -Maxzide discontinued on discharge -Appreciate nephrology-fluid restriction to 1500 cc a day -Recheck BMP at follow-up  Anemia of chronic disease: Slight drop in hemoglobin likely dilutional -FOBT and gastric content Hemoccult negative.  Hyperthyroidism: TSH mildly elevated. -Resumed home methimazole on discharge  Hypokalemia/hypomagnesemia: Resolved. -Recheck BMP and mag at follow-up  Permanent atrial fibrillation/SSS/s/p PPM, concern for WCT yesterday -Appreciate cardiology input, -Likely artifact mistaken for wide QRS complexes. -[employer] representative confirmed that there was no ectopy -Resume warfarin per pharmacy  Anxiety: Stable -Continue home medications  Constipation: Resolved with enema and bowel regimen. -PRN bowel regimen at home  Bilateral groin pain: Likely due to osteoarthritis.  Chronic issue. -Discharged on steroid taper and told she follows up with his orthopedic surgeon where she gets steroid injections.  Exophytic renal cyst: Noted on CT abdomen -Outpatient renal ultrasound  Hypertension: BP fairly controlled -Hold Maxzide and continue other medications -Reassess at follow-up.  Patient was evaluated by physical therapy and home health PT was recommended which I am in agreement as patient lives alone.  The above assessment and plan discussed with patient's son and patient on the day of discharge.  Discharge Instructions  Discharge Instructions    Diet general   Complete by:  As directed    Discharge instructions   Complete by:  As directed    It has been a pleasure taking care of you! You were admitted with nausea and vomiting, and low sodium level.  After the tests we  have done, we believe  your nausea and vomiting is related to constipation.  Your low sodium levels could be because of dehydration, the fluid pill you take and low electrolyte intake.  We discontinued your fluid pills.  We also recommend limiting your fluid intake to 1500 cc (6 cups) a day.  There are some changes to your medication during this hospitalization.  Please review your medication list and the direction before you take your medication.  Follow-up with your primary care doctor in 1 to 2 weeks.  Regards to your rib pains, we are discharging you on a steroid taper until you see your orthopedic surgeon for steroid injections.  Please take this medication with food.  Continue taking your antacid medication while you are on steroid.  Once you are discharged, your primary care physician will handle any further medical issues. Please note that NO REFILLS for any discharge medications will be authorized once you are discharged, as it is imperative that you return to your primary care physician (or establish a relationship with a primary care physician if you do not have one) for your aftercare needs so that they can reassess your need for medications and monitor your lab values. Take care,   Increase activity slowly   Complete by:  As directed      Allergies as of 08/11/2018      Reactions   Morphine And Related Nausea And Vomiting      Medication List    STOP taking these medications   triamterene-hydrochlorothiazide 37.5-25 MG capsule Commonly known as:  DYAZIDE     TAKE these medications   acetaminophen 325 MG tablet Commonly known as:  TYLENOL Take 650 mg by mouth every 6 (six) hours as needed for mild pain.   amLODipine 10 MG tablet Commonly known as:  NORVASC Take 10 mg by mouth every morning.   clonazePAM 0.5 MG tablet Commonly known as:  KLONOPIN Take 0.5 mg by mouth at bedtime as needed for anxiety.   docusate sodium 100 MG capsule Commonly known as:  COLACE Take 100 mg by mouth daily as  needed for mild constipation.   famotidine 20 MG tablet Commonly known as:  Pepcid Take 1 tablet (20 mg total) by mouth 2 (two) times daily. Take every AM and at bedtime What changed:  additional instructions   feeding supplement (ENSURE ENLIVE) Liqd Take 237 mLs by mouth 2 (two) times daily between meals.   irbesartan 300 MG tablet Commonly known as:  AVAPRO Take 300 mg by mouth every morning.   methimazole 5 MG tablet Commonly known as:  TAPAZOLE Take 1 tablet (5 mg total) by mouth daily.   omeprazole 20 MG capsule Commonly known as:  PRILOSEC Take 20 mg by mouth daily. 30-60 minutes before dinner.   ondansetron 4 MG tablet Commonly known as:  ZOFRAN Take 1 tablet (4 mg total) by mouth every 4 (four) hours as needed for nausea or vomiting. What changed:  how much to take   potassium chloride SA 20 MEQ tablet Commonly known as:  K-DUR Take 20 mEq by mouth 2 (two) times daily.   pravastatin 40 MG tablet Commonly known as:  PRAVACHOL Take 40 mg by mouth every morning.   predniSONE 10 MG (21) Tbpk tablet Commonly known as:  STERAPRED UNI-PAK 21 TAB Follow the direction on the pack. Start on 08/12/2018   traMADol 50 MG tablet Commonly known as:  ULTRAM Take 25 mg by mouth every 6 (six) hours as needed for  moderate pain.   warfarin 2 MG tablet Commonly known as:  COUMADIN Take as directed. If you are unsure how to take this medication, talk to your nurse or doctor. Original instructions:  Take 1-2 tablets (2-4 mg total) by mouth See admin instructions. Take 4 mg by mouth on MWF and 2 mg all other days What changed:  additional instructions      Follow-up Information    Evans Lance, MD Follow up on 08/31/2018.   Specialty:  Cardiology Why:  8:30 AM (Telehealth Video Visit) - Dr. Lovena Le will send a link to your smartphone to clinic and connect with him the day of your appointment.  Contact information: [address] [place] [address]  [phone]        Tisovec, Fransico Him, MD. Schedule an appointment as soon as possible for a visit in 1 week(s).   Specialty:  Internal Medicine Contact information: [place] [address] [address], [facility] Follow up.   Specialty:  Home Health Services Why:  Physical Therapy-office to call with an appointment.  Contact information: [place] [phone]           Consultations:  Nephrology  Cardiology  Procedures/Studies:  2D Echo: None  Dg Chest 2 View  Result Date: 07/18/2018 CLINICAL DATA:  Chest pressure and discomfort. History of atrial fibrillation EXAM: CHEST - 2 VIEW COMPARISON:  Chest radiograph June 11, 2016; chest CT June 12, 2016 FINDINGS: There is scarring in the left base. The lungs elsewhere are clear. Heart is upper normal in size with pulmonary vascularity normal. Pacemaker lead is attached to the right ventricle. No adenopathy. There is aortic atherosclerosis. There is degenerative change in each shoulder. IMPRESSION: Scarring left base. No edema or consolidation. Stable cardiac silhouette. Pacemaker lead attached to right ventricle. Aortic Atherosclerosis (ICD10-I70.0). Electronically Signed   By: Lowella Grip III M.D.   On: 07/18/2018 13:50   Ct Abdomen Pelvis W Contrast  Result Date: 08/09/2018 CLINICAL DATA:  84 year old female with abdominal distention, nausea vomiting and epigastric pain. EXAM: CT ABDOMEN AND PELVIS WITH CONTRAST TECHNIQUE: Multidetector CT imaging of the abdomen and pelvis was performed using the standard protocol following bolus administration of intravenous contrast. CONTRAST:  169mL OMNIPAQUE IOHEXOL 300 MG/ML  SOLN COMPARISON:  None. FINDINGS: Lower chest: There are bibasilar atelectatic changes. The visualized lung bases are otherwise clear. There is mild cardiomegaly and coronary vascular calcification. A cardiac pacemaker  wire is noted. No intra-abdominal free air or free fluid. Hepatobiliary: The liver is unremarkable. Cholecystectomy. No calcified stone noted in the central CBD. Pancreas: Unremarkable. No pancreatic ductal dilatation or surrounding inflammatory changes. Spleen: Normal in size without focal abnormality. Adrenals/Urinary Tract: The adrenal glands are unremarkable. There is no hydronephrosis on either side. There is symmetric enhancement and excretion of contrast by both kidneys. There is a 2.5 x 3.7 cm high attenuating exophytic lesion in the inferior pole of the left kidney which does not demonstrate significant attenuation difference in delayed images and most likely represents a proteinaceous or hemorrhagic cyst. Further evaluation with ultrasound on a nonemergent basis recommended. Several additional cysts and smaller hypodense lesions which are too small to characterize noted. The visualized ureters and urinary bladder appear unremarkable. Stomach/Bowel: There is a small hiatal hernia. Fluid within the distal esophagus, likely related to reflux. There is no bowel obstruction. There is sigmoid diverticulosis. Moderate  stool noted throughout the colon. A 19 x 14 mm polypoid appearing lesion in the right posterior rectal wall (series 9, image 91) may represent a fold or a soft tissue lesion. Further evaluation with rectal exam recommended. Vascular/Lymphatic: There is advanced aortoiliac atherosclerotic disease. Apparent areas of intraluminal low attenuation involving the common femoral vein bilaterally (series 9, image 83) most likely artifactual. DVT is considered less likely. Duplex ultrasound can provide better evaluation if there is high clinical concern for DVT. There is no adenopathy. Reproductive: Hysterectomy. No pelvic mass. Other: None Musculoskeletal: Right hip arthroplasty. Osteopenia with degenerative changes of the spine. No acute osseous pathology. IMPRESSION: 1. No acute intra-abdominal or pelvic  pathology. 2. Sigmoid diverticulosis. No bowel obstruction or active inflammation. 3. A 19 x 14 mm polypoid lesion in the right posterior rectal wall, likely a fold or a soft tissue lesion. Further evaluation with rectal exam recommended. 4. High attenuating exophytic left renal inferior pole lesion, likely a complex cyst. Further evaluation with ultrasound on a nonemergent basis recommended. Electronically Signed   By: Anner Crete M.D.   On: 08/09/2018 00:14   Dg Chest Portable 1 View  Result Date: 08/08/2018 CLINICAL DATA:  Tachycardia EXAM: PORTABLE CHEST 1 VIEW COMPARISON:  July 18, 2018 FINDINGS: There is scarring and atelectasis in the left base. Lungs elsewhere are clear. Heart is mildly enlarged with pulmonary vascularity. Pacemaker lead is attached to the right ventricle. There is aortic atherosclerosis. No adenopathy. There is degenerative change in each shoulder. IMPRESSION: Scarring and atelectasis in left lung base. Lungs elsewhere clear. Stable cardiac prominence. Stable pacemaker lead positioning. Aortic Atherosclerosis (ICD10-I70.0). Electronically Signed   By: Lowella Grip III M.D.   On: 08/08/2018 21:47     Subjective: No major events overnight of this morning.  Complains about bilateral hip/groin pain which is chronic for her.  States she follows up with orthopedic surgery and get steroid injections.  No chest pain, dyspnea, palpitation, dizziness, nausea, vomiting or abdominal pain.   Discharge Exam: Vitals:   08/11/18 0408 08/11/18 0922  BP: (!) 149/62 (!) 164/58  Pulse: 62   Resp: 18   Temp: 98 F (36.7 C)   SpO2: 95%     GENERAL: No acute distress.  Appears well.  HEENT: MMM.  Vision and hearing grossly intact.  NECK: Supple.  No JVD.  LUNGS:  No IWOB. Good air movement bilaterally. HEART:  RRR. Heart sounds normal.  ABD: Bowel sounds present. Soft. Non tender.  MSK/EXT:  Moves all extremities. No apparent deformity. No edema bilaterally.  FABER and  FADIR triggers pain in both groins. SKIN: no apparent skin lesion or wound NEURO: Awake, alert and oriented appropriately.  No gross deficit.  PSYCH: Calm. Normal affect.   The results of significant diagnostics from this hospitalization (including imaging, microbiology, ancillary and laboratory) are listed below for reference.     Microbiology: Recent Results (from the past 240 hour(s))  SARS Coronavirus 2 (CEPHEID - Performed in [facility] hospital lab), Hosp Order     Status: None   Collection Time: 08/08/18  9:38 PM  Result Value Ref Range Status   SARS Coronavirus 2 NEGATIVE NEGATIVE Final    Comment: (NOTE) If result is NEGATIVE SARS-CoV-2 target nucleic acids are NOT DETECTED. The SARS-CoV-2 RNA is generally detectable in upper and lower  respiratory specimens during the acute phase of infection. The lowest  concentration of SARS-CoV-2 viral copies this assay can detect is 250  copies / mL. A negative result  does not preclude SARS-CoV-2 infection  and should not be used as the sole basis for treatment or other  patient management decisions.  A negative result may occur with  improper specimen collection / handling, submission of specimen other  than nasopharyngeal swab, presence of viral mutation(s) within the  areas targeted by this assay, and inadequate number of viral copies  (<250 copies / mL). A negative result must be combined with clinical  observations, patient history, and epidemiological information. If result is POSITIVE SARS-CoV-2 target nucleic acids are DETECTED. The SARS-CoV-2 RNA is generally detectable in upper and lower  respiratory specimens dur ing the acute phase of infection.  Positive  results are indicative of active infection with SARS-CoV-2.  Clinical  correlation with patient history and other diagnostic information is  necessary to determine patient infection status.  Positive results do  not rule out bacterial infection or co-infection with  other viruses. If result is PRESUMPTIVE POSTIVE SARS-CoV-2 nucleic acids MAY BE PRESENT.   A presumptive positive result was obtained on the submitted specimen  and confirmed on repeat testing.  While 2019 novel coronavirus  (SARS-CoV-2) nucleic acids may be present in the submitted sample  additional confirmatory testing may be necessary for epidemiological  and / or clinical management purposes  to differentiate between  SARS-CoV-2 and other Sarbecovirus currently known to infect humans.  If clinically indicated additional testing with an alternate test  methodology [phone]) is advised. The SARS-CoV-2 RNA is generally  detectable in upper and lower respiratory sp ecimens during the acute  phase of infection. The expected result is Negative. Fact Sheet for Patients:  StrictlyIdeas.no Fact Sheet for Healthcare Providers: BankingDealers.co.za This test is not yet approved or cleared by the [place] FDA and has been authorized for detection and/or diagnosis of SARS-CoV-2 by FDA under an Emergency Use Authorization (EUA).  This EUA will remain in effect (meaning this test can be used) for the duration of the COVID-19 declaration under Section 564(b)(1) of the Act, 21 U.S.C. section 360bbb-3(b)(1), unless the authorization is terminated or revoked sooner. Performed at [facility], [place] [address], [place], [place] [address]   Urine culture     Status: Abnormal   Collection Time: 08/09/18  9:01 AM  Result Value Ref Range Status   Specimen Description URINE, CLEAN CATCH  Final   Special Requests NONE  Final   Culture (A)  Final    <10,000 COLONIES/mL INSIGNIFICANT GROWTH Performed at [facility], [place] [address], [place], [place] [address]    Report Status 08/10/2018 FINAL  Final     Labs: BNP (last 3 results) Recent Labs    08/09/18 0114  BNP 856.3*   Basic Metabolic Panel: Recent Labs  Lab 08/08/18 2131  08/09/18 0114 08/09/18 0736 08/09/18 1148  08/09/18 2320 08/10/18 0607 08/10/18 1132 08/10/18 1734 08/11/18 0504  NA 122* 119* 119*  --    < > 120* 123* 124* 125* 127*  K 4.0 3.6 3.5  --   --   --  4.7  --   --  4.1  CL 83* 84* 86*  --   --   --  88*  --   --  95*  CO2 25 23 23   --   --   --  23  --   --  25  GLUCOSE 111* 138* 123*  --   --   --  82  --   --  96  BUN 15 16  15  --   --   --  14  --   --  11  CREATININE 0.92 0.89 0.85  --   --   --  0.90  --   --  0.89  CALCIUM 9.9 9.0 8.5*  --   --   --  8.8*  --   --  8.6*  MG 1.5*  --   --  1.9  --   --  1.9  --   --  1.9   < > = values in this interval not displayed.   Liver Function Tests: Recent Labs  Lab 08/08/18 2131 08/11/18 0504  AST 21 21  ALT 16 13  ALKPHOS 107 96  BILITOT 0.8 0.7  PROT 6.9 5.4*  ALBUMIN 3.9 3.3*   Recent Labs  Lab 08/09/18 0114  LIPASE 40   No results for input(s): AMMONIA in the last 168 hours. CBC: Recent Labs  Lab 08/08/18 2131 08/09/18 0736 08/11/18 0504  WBC 9.2 11.6*  --   NEUTROABS 7.4  --   --   HGB 11.3* 10.4* 9.8*  HCT 34.0* 30.3* 30.0*  MCV 83.1 82.3  --   PLT 350 330  --    Cardiac Enzymes: Recent Labs  Lab 08/08/18 2131 08/09/18 0114 08/09/18 0736 08/09/18 1148  TROPONINI <0.03 <0.03 <0.03 <0.03   BNP: Invalid input(s): POCBNP CBG: Recent Labs  Lab 08/10/18 0754 08/11/18 0732  GLUCAP 91 96   D-Dimer No results for input(s): DDIMER in the last 72 hours. Hgb A1c No results for input(s): HGBA1C in the last 72 hours. Lipid Profile No results for input(s): CHOL, HDL, LDLCALC, TRIG, CHOLHDL, LDLDIRECT in the last 72 hours. Thyroid function studies Recent Labs    08/10/18 0402  TSH 6.849*   Anemia work up No results for input(s): VITAMINB12, FOLATE, FERRITIN, TIBC, IRON, RETICCTPCT in the last 72 hours. Urinalysis    Component Value Date/Time   COLORURINE YELLOW 08/09/2018 0913   APPEARANCEUR CLEAR 08/09/2018 0913   LABSPEC 1.010 08/09/2018 0913    PHURINE 5.5 08/09/2018 0913   GLUCOSEU 100 (A) 08/09/2018 0913   HGBUR TRACE (A) 08/09/2018 0913   BILIRUBINUR NEGATIVE 08/09/2018 0913   KETONESUR NEGATIVE 08/09/2018 0913   PROTEINUR NEGATIVE 08/09/2018 0913   UROBILINOGEN 0.2 11/12/2014 1331   NITRITE NEGATIVE 08/09/2018 0913   LEUKOCYTESUR NEGATIVE 08/09/2018 0913   Sepsis Labs Invalid input(s): PROCALCITONIN,  WBC,  LACTICIDVEN   Time coordinating discharge: 35 minutes  SIGNED:  Mercy Riding, MD  [facility] 08/11/2018, 3:00 PM Pager [phone]  If 7PM-7AM, please contact night-coverage [email or web address] Password [email or web address]

## 2018-08-11 NOTE — Care Management Important Message (Signed)
Important Message  Patient Details  Name: [name] MRN: [id number] Date of Birth: April 30, 1925   Medicare Important Message Given:  Yes    Memory Argue 08/11/2018, 2:18 PM

## 2018-08-11 NOTE — TOC Initial Note (Signed)
Transition of Care [facility]) - Initial/Assessment Note    Patient Details  Name: [name] MRN: [id number] Date of Birth: 03/27/1926  Transition of Care [facility]) CM/SW Contact:    Bethena Roys, RN Phone Number: 08/11/2018, 11:13 AM  Clinical Narrative: Pt presented for nausea and vomiting; abdominal pain. PTA from home alone, however has support of son that lives next door and her granddaughter. PT recommendations for [facility] PT- Medicare.gov list provided and patient chose  [facility]. Referral called to Glyn Ade and [facility] to begin 08-12-18. Pt has DME cane and RW in the home. Medications to be delivered to room via [place]. Patient uses Local [facility] [address]- no issues with getting medications. Pt has PCP- Richard Tisovec and is compliant with appointments. Son provides transportation- and will pick up today. No further home needs identified at this time.                 Expected Discharge Plan: [place] Barriers to Discharge: No Barriers Identified   Patient Goals and CMS Choice Patient states their goals for this hospitalization and ongoing recovery are:: "To get back home with family support" CMS Medicare.gov Compare Post Acute Care list provided to:: Patient Choice offered to / list presented to : Patient  Expected Discharge Plan and Services Expected Discharge Plan: [place] In-house Referral: NA Discharge Planning Services: CM Consult Post Acute Care Choice: [place] arrangements for the past 2 months: Single Family Home Expected Discharge Date: 08/11/18               DME Arranged: N/A DME Agency: NA       HH Arranged: PT HH Agency: [facility] Date [place] Agency Contacted: 08/11/18 Time HH Agency Contacted: 1112 Representative spoke with at [place]: Glyn Ade  Prior Living Arrangements/Services Living arrangements for the past 2 months: Single Family Home Lives with::  Self(Son lives next door. ) Patient language and need for interpreter reviewed:: Yes Do you feel safe going back to the place where you live?: Yes      Need for Family Participation in Patient Care: Yes (Comment) Care giver support system in place?: Yes (comment) Current home services: DME(Pt has DME RW and Cane) Criminal Activity/Legal Involvement Pertinent to Current Situation/Hospitalization: No - Comment as needed  Activities of Daily Living Home Assistive Devices/Equipment: Cane (specify quad or straight) ADL Screening (condition at time of admission) Patient's cognitive ability adequate to safely complete daily activities?: No Is the patient deaf or have difficulty hearing?: No Does the patient have difficulty seeing, even when wearing glasses/contacts?: No Does the patient have difficulty concentrating, remembering, or making decisions?: No Patient able to express need for assistance with ADLs?: No Does the patient have difficulty dressing or bathing?: No Independently performs ADLs?: Yes (appropriate for developmental age) Does the patient have difficulty walking or climbing stairs?: No Weakness of Legs: None Weakness of Arms/Hands: None  Permission Sought/Granted Permission sought to share information with : Family Supports                Emotional Assessment Appearance:: Appears stated age Attitude/Demeanor/Rapport: Gracious, Engaged Affect (typically observed): Accepting Orientation: : Oriented to Self, Oriented to Place, Oriented to  Time, Oriented to Situation Alcohol / Substance Use: Not Applicable Psych Involvement: No (comment)  Admission diagnosis:  Nausea [R11.0] Epigastric pain [R10.13] V tach (HCC) [I47.2] Nausea and vomiting, intractability of vomiting not specified, unspecified vomiting  type [R11.2] Patient Active Problem List   Diagnosis Date Noted  . V tach ([place]) 08/09/2018  . Intractable nausea and vomiting 08/09/2018  . Hyponatremia 08/09/2018   . Hypomagnesemia 08/09/2018  . Hyperthyroidism 08/09/2018  . Epigastric pain   . Nonspecific chest pain 07/18/2018  . Anxiety 07/18/2018  . Insomnia 07/18/2018  . Complication of anesthesia   . Intractable headache 01/06/2017  . Intractable pain 01/04/2017  . Chronic anticoagulation 01/03/2017  . Neck pain 01/03/2017  . Acute bronchitis 06/08/2016  . Fall 09/24/2015  . Dizziness 09/24/2015  . Postoperative anemia due to acute blood loss 12/27/2013  . Postop Transfusion 12/27/2013  . OA (osteoarthritis) of hip 12/24/2013  . Preop cardiovascular exam 09/25/2013  . Cardiac pacemaker in situ 08/29/2008  . Essential hypertension 08/21/2008  . ATRIAL FIBRILLATION 08/21/2008  . Bradycardia 08/21/2008  . PNEUMONIA 08/21/2008  . GERD 08/21/2008  . NEPHROLITHIASIS 08/21/2008  . Osteoarthritis 08/21/2008   PCP:  [name], MD Pharmacy:   [facility], [address] [place] [address] Phone: [phone] Fax: [phone]  [id number] [address], [place] [place] [address] Phone: [phone] Fax: [phone]  [facility] [place] [place] - [address] [place] [address] Phone: [phone] Fax: [phone]     Social Determinants of Health (SDOH) Interventions    Readmission Risk Interventions No flowsheet data found.

## 2018-08-11 NOTE — Progress Notes (Signed)
ANTICOAGULATION CONSULT NOTE - Follow Up Consult  Pharmacy Consult for Warfarin Indication: atrial fibrillation  Allergies  Allergen Reactions  . Morphine And Related Nausea And Vomiting    Patient Measurements: Height: 5\' 3"  (160 cm) Weight: 156 lb (70.8 kg) IBW/kg (Calculated) : 52.4  Vital Signs: Temp: 98 F (36.7 C) (05/15 0408) Temp Source: Oral (05/15 0408) BP: 164/58 (05/15 0922) Pulse Rate: 62 (05/15 0408)  Labs: Recent Labs    08/08/18 2131 08/09/18 0114 08/09/18 0736 08/09/18 1148 08/10/18 0607 08/11/18 0504  HGB 11.3*  --  10.4*  --   --  9.8*  HCT 34.0*  --  30.3*  --   --  30.0*  PLT 350  --  330  --   --   --   LABPROT 22.1*  --   --   --  27.3* 31.0*  INR 2.0*  --   --   --  2.6* 3.0*  CREATININE 0.92 0.89 0.85  --  0.90 0.89  TROPONINI <0.03 <0.03 <0.03 <0.03  --   --     Estimated Creatinine Clearance: 39.7 mL/min (by C-G formula based on SCr of 0.89 mg/dL).   Assessment: Anticoag: Warfarin PTA for afib. CHA2DS2-VASc Score is 4. Hgb 11.3>10.4>9.8 declining. INR 2.6>3 - Warfarin 4mg  MWF and 2mg  TTSS  Goal of Therapy:  INR 2-3 Monitor platelets by anticoagulation protocol: Yes   Plan:  Would recommend to Hold Coumadin tonight x 1, then resume home dose. Daily INR Plan discharge 5/15   Kamare Caspers S. Alford [place], PharmD, BCPS Clinical Staff Pharmacist Eilene Ghazi Stilling[place] 08/11/2018,10:08 AM

## 2018-08-12 ENCOUNTER — Other Ambulatory Visit: Payer: Self-pay

## 2018-08-12 ENCOUNTER — Emergency Department (HOSPITAL_COMMUNITY)
Admission: EM | Admit: 2018-08-12 | Discharge: 2018-08-12 | Disposition: A | Discharge status: Home / Self Care | Payer: Medicare Other | Attending: Emergency Medicine | Admitting: Emergency Medicine

## 2018-08-12 ENCOUNTER — Encounter (HOSPITAL_COMMUNITY): Payer: Self-pay | Admitting: Emergency Medicine

## 2018-08-12 DIAGNOSIS — M25551 Pain in right hip: Secondary | ICD-10-CM | POA: Diagnosis present

## 2018-08-12 DIAGNOSIS — Z79899 Other long term (current) drug therapy: Secondary | ICD-10-CM | POA: Insufficient documentation

## 2018-08-12 DIAGNOSIS — M545 Low back pain, unspecified: Secondary | ICD-10-CM

## 2018-08-12 DIAGNOSIS — Z96653 Presence of artificial knee joint, bilateral: Secondary | ICD-10-CM | POA: Diagnosis not present

## 2018-08-12 DIAGNOSIS — R531 Weakness: Secondary | ICD-10-CM | POA: Diagnosis not present

## 2018-08-12 DIAGNOSIS — Z96641 Presence of right artificial hip joint: Secondary | ICD-10-CM | POA: Insufficient documentation

## 2018-08-12 DIAGNOSIS — Z87891 Personal history of nicotine dependence: Secondary | ICD-10-CM | POA: Diagnosis not present

## 2018-08-12 DIAGNOSIS — Z7952 Long term (current) use of systemic steroids: Secondary | ICD-10-CM | POA: Diagnosis not present

## 2018-08-12 DIAGNOSIS — M1612 Unilateral primary osteoarthritis, left hip: Secondary | ICD-10-CM | POA: Diagnosis not present

## 2018-08-12 DIAGNOSIS — I10 Essential (primary) hypertension: Secondary | ICD-10-CM | POA: Insufficient documentation

## 2018-08-12 DIAGNOSIS — Z8701 Personal history of pneumonia (recurrent): Secondary | ICD-10-CM | POA: Diagnosis not present

## 2018-08-12 DIAGNOSIS — F419 Anxiety disorder, unspecified: Secondary | ICD-10-CM | POA: Diagnosis not present

## 2018-08-12 DIAGNOSIS — M25552 Pain in left hip: Secondary | ICD-10-CM | POA: Insufficient documentation

## 2018-08-12 DIAGNOSIS — Z950 Presence of cardiac pacemaker: Secondary | ICD-10-CM | POA: Diagnosis not present

## 2018-08-12 DIAGNOSIS — Z7901 Long term (current) use of anticoagulants: Secondary | ICD-10-CM | POA: Insufficient documentation

## 2018-08-12 DIAGNOSIS — G4700 Insomnia, unspecified: Secondary | ICD-10-CM | POA: Diagnosis not present

## 2018-08-12 DIAGNOSIS — Z85828 Personal history of other malignant neoplasm of skin: Secondary | ICD-10-CM | POA: Insufficient documentation

## 2018-08-12 DIAGNOSIS — K219 Gastro-esophageal reflux disease without esophagitis: Secondary | ICD-10-CM | POA: Diagnosis not present

## 2018-08-12 DIAGNOSIS — I4891 Unspecified atrial fibrillation: Secondary | ICD-10-CM | POA: Diagnosis not present

## 2018-08-12 DIAGNOSIS — Z9181 History of falling: Secondary | ICD-10-CM | POA: Diagnosis not present

## 2018-08-12 DIAGNOSIS — R52 Pain, unspecified: Secondary | ICD-10-CM | POA: Diagnosis not present

## 2018-08-12 DIAGNOSIS — F69 Unspecified disorder of adult personality and behavior: Secondary | ICD-10-CM | POA: Diagnosis not present

## 2018-08-12 DIAGNOSIS — R32 Unspecified urinary incontinence: Secondary | ICD-10-CM | POA: Diagnosis not present

## 2018-08-12 MED ORDER — LIDOCAINE 4 % EX PTCH: 1.0000 | MEDICATED_PATCH | Freq: Every day | CUTANEOUS | 0 refills | Status: DC

## 2018-08-12 MED ORDER — LIDOCAINE 5 % EX PTCH
1.0000 | MEDICATED_PATCH | CUTANEOUS | Status: DC
  Administered 2018-08-12: 1 via TRANSDERMAL
  Filled 2018-08-12: qty 1

## 2018-08-12 MED ORDER — TRAMADOL HCL 50 MG PO TABS
50.0000 mg | ORAL_TABLET | Freq: Once | ORAL | Status: AC
  Administered 2018-08-12: 05:00:00 50 mg via ORAL
  Filled 2018-08-12: qty 1

## 2018-08-12 NOTE — Progress Notes (Signed)
CSW met with patient and family member to discuss home supports. CSW reviewed prior chart and noted [facility] has been arranged. CSW noted per patient they were planning on coming to the house today. CSW noted patient reported she had walker and cane and would be able to get out of bed without support and noted family member disagreed. CSW inquired about ALF placement and noted patient was not interested and wanted to return home. CSW noted patient reported she had a 911 alert but not a necklace. CSW provided patient with information for DSS and Med Alert and discussed with patient's family possible follow-up plans to support patient to reduce risk of rehospitalization. CSW noted patient was not amenable towards facility placement. CSW provided family with information in case patient becomes open. CSW signing off, please re-consult for future social work needs.   Lamonte Richer, LCSW, [place] Worker II [phone]

## 2018-08-12 NOTE — ED Triage Notes (Addendum)
Pt presents to ED by EMS c/o of weakness, visual changes, and being "off" per EMS. Per pt she wanted to come in for hip pain and groin pain. Hx of hip replacement. Given 50 mcg fentanyl by EMS for pain. Per EMS when pt was last seen 2 days ago pt had low Na. Per EMS she was treated for nausea at last visit but Na was never addressed. EMS was told this by family.

## 2018-08-12 NOTE — ED Provider Notes (Signed)
[facility] EMERGENCY DEPARTMENT Provider Note  CSN: [id number] Arrival date & time: 08/12/18 0305  Chief Complaint(s) Groin Pain and Hip Pain  HPI [name] is a 85 y.o. female with extensive past medical history listed below who presents to the emergency department with worsening bilateral lumbosacral/hip pain.  She reports that pain is chronic in nature and is been going on for several years.  This is being managed by Dr. Nelva Bush.  She reports a mechanical fall 2 weeks ago and since then her pain has gradually worsening.  Pain is exacerbated with movement and palpation.  Alleviated by immobility.  Denies any urinary continence, numbness or weakness.  Of note patient was just discharged from the hospital at which time a CT of the abdomen and pelvis was obtained revealing no fractures.  It did reveal significant degenerative disc disease.  Patient was admitted for symptomatic hyponatremia with nausea and vomiting which was corrected slowly.  When she got home patient's pain became severe.  Tried taking extra strength Tylenol without relief.  Has not taken tramadol.  Denies any other physical complaints at this time. HPI  Past Medical History Past Medical History:  Diagnosis Date  . Anxiety   . Atrial fibrillation ([place])   . Bradycardia   . Cancer ([place])    skin  . Cataract    removed bilat with lens both eyes   . Complication of anesthesia   . Difficulty sleeping   . Dysrhythmia    A FIB  . GERD (gastroesophageal reflux disease)   . H/O hiatal hernia   . History of kidney stones   . History of skin cancer   . History of transfusion   . Hyperlipidemia   . Hypertension   . Osteoarthritis   . Pacemaker   . Pneumonia   . PONV (postoperative nausea and vomiting)   . Shortness of breath    WITH EXERTION   Patient Active Problem List   Diagnosis Date Noted  . V tach ([place]) 08/09/2018  . Intractable nausea and vomiting 08/09/2018  . Hyponatremia 08/09/2018   . Hypomagnesemia 08/09/2018  . Hyperthyroidism 08/09/2018  . Epigastric pain   . Nonspecific chest pain 07/18/2018  . Anxiety 07/18/2018  . Insomnia 07/18/2018  . Complication of anesthesia   . Intractable headache 01/06/2017  . Intractable pain 01/04/2017  . Chronic anticoagulation 01/03/2017  . Neck pain 01/03/2017  . Acute bronchitis 06/08/2016  . Fall 09/24/2015  . Dizziness 09/24/2015  . Postoperative anemia due to acute blood loss 12/27/2013  . Postop Transfusion 12/27/2013  . OA (osteoarthritis) of hip 12/24/2013  . Preop cardiovascular exam 09/25/2013  . Cardiac pacemaker in situ 08/29/2008  . Essential hypertension 08/21/2008  . ATRIAL FIBRILLATION 08/21/2008  . Bradycardia 08/21/2008  . PNEUMONIA 08/21/2008  . GERD 08/21/2008  . NEPHROLITHIASIS 08/21/2008  . Osteoarthritis 08/21/2008   Home Medication(s) Prior to Admission medications   Medication Sig Start Date End Date Taking? Authorizing Provider  acetaminophen (TYLENOL) 325 MG tablet Take 650 mg by mouth every 6 (six) hours as needed for mild pain.     [provider]  amLODipine (NORVASC) 10 MG tablet Take 10 mg by mouth every morning.     [provider]  clonazePAM (KLONOPIN) 0.5 MG tablet Take 0.5 mg by mouth at bedtime as needed for anxiety.  03/18/15   [provider]  docusate sodium (COLACE) 100 MG capsule Take 100 mg by mouth daily as needed for mild constipation.  [provider]  famotidine (PEPCID) 20 MG tablet Take 1 tablet (20 mg total) by mouth 2 (two) times daily. Take every AM and at bedtime Patient taking differently: Take 20 mg by mouth 2 (two) times daily.  05/04/18   Levin Erp, PA  feeding supplement, ENSURE ENLIVE, (ENSURE ENLIVE) LIQD Take 237 mLs by mouth 2 (two) times daily between meals. 08/11/18   Mercy Riding, MD  irbesartan (AVAPRO) 300 MG tablet Take 300 mg by mouth every morning.    [provider]  Lidocaine (HM LIDOCAINE  PATCH) 4 % PTCH Apply 1 patch topically daily. 08/12/18   Jemimah Cressy, Grayce Sessions, MD  methimazole (TAPAZOLE) 5 MG tablet Take 1 tablet (5 mg total) by mouth daily. 07/20/18   Geradine Girt, DO  omeprazole (PRILOSEC) 20 MG capsule Take 20 mg by mouth daily. 30-60 minutes before dinner.    [provider]  ondansetron (ZOFRAN) 4 MG tablet Take 1 tablet (4 mg total) by mouth every 4 (four) hours as needed for nausea or vomiting. Patient taking differently: Take 2 mg by mouth every 4 (four) hours as needed for nausea or vomiting.  04/15/17   Levin Erp, PA  potassium chloride SA (K-DUR) 20 MEQ tablet Take 20 mEq by mouth 2 (two) times daily. 04/29/18   [provider]  pravastatin (PRAVACHOL) 40 MG tablet Take 40 mg by mouth every morning.     [provider]  predniSONE (STERAPRED UNI-PAK 21 TAB) 10 MG (21) TBPK tablet Follow the direction on the pack. Start on 08/12/2018 08/11/18   Mercy Riding, MD  traMADol (ULTRAM) 50 MG tablet Take 25 mg by mouth every 6 (six) hours as needed for moderate pain.    [provider]  warfarin (COUMADIN) 2 MG tablet Take 1-2 tablets (2-4 mg total) by mouth See admin instructions. Take 4 mg by mouth on MWF and 2 mg all other days Patient taking differently: Take 2-4 mg by mouth See admin instructions. Take 4 mg by mouth on MWF and 2 mg all other days. 07/19/18   Geradine Girt, DO                                                                                                                                    Past Surgical History Past Surgical History:  Procedure Laterality Date  . APPENDECTOMY  1960  . ARTHROPLASTY  1994   Left total knee  . ARTHROPLASTY  2000   Total right knee  . BACK SURGERY    . CATARACT EXTRACTION, BILATERAL     with lens implants  . CHOLECYSTECTOMY    . COLONOSCOPY    . DG SELECTED HSG GDC ONLY  2002   Dilation  . Microdiskectomy  05/2004   Left L4-L5  . PACEMAKER INSERTION    .  POLYPECTOMY    . TOTAL ABDOMINAL HYSTERECTOMY  1969  .  TOTAL HIP ARTHROPLASTY Right 12/24/2013   Procedure: RIGHT TOTAL HIP ARTHROPLASTY ANTERIOR APPROACH;  Surgeon: Gearlean Alf, MD;  Location: [facility];  Service: Orthopedics;  Laterality: Right;  . UPPER GASTROINTESTINAL ENDOSCOPY     Family History Family History  Problem Relation Age of Onset  . Heart disease Mother   . Leukemia Father   . Colon cancer Neg Hx   . Esophageal cancer Neg Hx   . Stomach cancer Neg Hx   . Pancreatic cancer Neg Hx   . Liver disease Neg Hx   . Colon polyps Neg Hx     Social History Social History   Tobacco Use  . Smoking status: Former Smoker    Last attempt to quit: 12/18/1973    Years since quitting: 44.6  . Smokeless tobacco: Never Used  . Tobacco comment: Smoker for 25 years and quit at around age 24  Substance Use Topics  . Alcohol use: No  . Drug use: No   Allergies Morphine and related  Review of Systems Review of Systems All other systems are reviewed and are negative for acute change except as noted in the HPI  Physical Exam Vital Signs  I have reviewed the triage vital signs BP (!) 166/74 (BP Location: Right Arm)   Pulse 66   Temp 97.6 F (36.4 C) (Oral)   Resp 13   Ht 5\' 3"  (1.6 m)   Wt 70.8 kg   SpO2 99%   BMI 27.63 kg/m   Physical Exam Vitals signs reviewed.  Constitutional:      General: She is not in acute distress.    Appearance: She is well-developed. She is not diaphoretic.  HENT:     Head: Normocephalic and atraumatic.     Right Ear: External ear normal.     Left Ear: External ear normal.     Nose: Nose normal.  Eyes:     General: No scleral icterus.    Conjunctiva/sclera: Conjunctivae normal.  Neck:     Musculoskeletal: Normal range of motion.     Trachea: Phonation normal.  Cardiovascular:     Rate and Rhythm: Normal rate and regular rhythm.  Pulmonary:     Effort: Pulmonary effort is normal. No respiratory distress.     Breath sounds: No  stridor.  Abdominal:     General: There is no distension.  Musculoskeletal: Normal range of motion.     Lumbar back: She exhibits tenderness and bony tenderness.       Back:     Comments: Tenderness to palpation over bilateral SI joints  Neurological:     Mental Status: She is alert and oriented to person, place, and time.     Comments: Spine Exam: Strength: 5/5 throughout LE bilaterally  Sensation: Intact to light touch in proximal and distal LE bilaterally Reflexes: 1+ quadriceps and achilles reflexes   Psychiatric:        Behavior: Behavior normal.     ED Results and Treatments Labs (all labs ordered are listed, but only abnormal results are displayed) Labs Reviewed - No data to display  EKG  EKG Interpretation  Date/Time:    Ventricular Rate:    PR Interval:    QRS Duration:   QT Interval:    QTC Calculation:   R Axis:     Text Interpretation:        Radiology No results found. Pertinent labs & imaging results that were available during my care of the patient were reviewed by me and considered in my medical decision making (see chart for details).  Medications Ordered in ED Medications  lidocaine (LIDODERM) 5 % 1 patch (1 patch Transdermal Patch Applied 08/12/18 0436)  traMADol (ULTRAM) tablet 50 mg (50 mg Oral Given 08/12/18 0436)                                                                                                                                    Procedures Procedures  (including critical care time)  Medical Decision Making / ED Course I have reviewed the nursing notes for this encounter and the patient's prior records (if available in EHR or on provided paperwork).    Patient presents with exacerbation of chronic back pain.  No falls since being discharged several hours ago.  Labs less than 24 hours ago showed improving  electrolytes.  She has already had a negative CT scan which did not reveal any fractures.  She does not show evidence of cauda equina at this time.  She was treated symptomatically with oral tramadol and lidocaine patch resulting in significant relief.  Patient already has physical therapy scheduled for later on today.  Family at bedside comfortable taking patient home.  The patient appears reasonably screened and/or stabilized for discharge and I doubt any other medical condition or other [facility] requiring further screening, evaluation, or treatment in the ED at this time prior to discharge.  The patient is safe for discharge with strict return precautions.   Final Clinical Impression(s) / ED Diagnoses Final diagnoses:  Lumbosacral pain    Disposition: Discharge  Condition: Good  I have discussed the results, Dx and Tx plan with the patient and son who expressed understanding and agree(s) with the plan. Discharge instructions discussed at great length. The patient and son was given strict return precautions who verbalized understanding of the instructions. No further questions at time of discharge.    ED Discharge Orders         Ordered    Lidocaine (HM LIDOCAINE PATCH) 4 % [facility]  Daily     08/12/18 2094           Follow Up: [name], MD [place] [address] [phone]  Schedule an appointment as soon as possible for a visit  As needed     This chart was dictated using voice recognition software.  Despite best efforts to proofread,  errors can occur which can change the documentation meaning.   Fatima Blank, MD 08/12/18 [phone]

## 2018-08-12 NOTE — ED Notes (Signed)
RN spoke to son. Son called 911 d/t concern for pt mental status and pain. He reports she continues to be off. He's concerned for stroke d/t ams. He is also concerned for her blurred vision which began 2 days ago.

## 2018-08-12 NOTE — ED Notes (Addendum)
Pt and son discussed dc instructions and vebalized understanding of same.. Son at bedside and requesting information about home resources.

## 2018-08-12 NOTE — ED Notes (Signed)
Social worker contacted and states he will come to see pt and family. Pt and Son made aware.

## 2018-08-12 NOTE — ED Notes (Signed)
Social worker at bedside.

## 2018-08-12 NOTE — ED Notes (Signed)
Pt asking RN if she is still in the ambulance. After pt was told no and reoriented pt asked RN the same question again.

## 2018-08-14 ENCOUNTER — Ambulatory Visit
Admission: RE | Admit: 2018-08-14 | Discharge: 2018-08-14 | Disposition: A | Discharge status: Home / Self Care | Payer: Medicare Other | Source: Ambulatory Visit | Attending: Sports Medicine | Admitting: Sports Medicine

## 2018-08-14 ENCOUNTER — Other Ambulatory Visit: Payer: Self-pay | Admitting: Sports Medicine

## 2018-08-14 ENCOUNTER — Other Ambulatory Visit: Payer: Self-pay

## 2018-08-14 DIAGNOSIS — M25552 Pain in left hip: Secondary | ICD-10-CM | POA: Diagnosis not present

## 2018-08-14 DIAGNOSIS — M25551 Pain in right hip: Secondary | ICD-10-CM | POA: Diagnosis not present

## 2018-08-14 NOTE — Telephone Encounter (Signed)
Follow up   RS appt for phone visit with Dr. Lovena Le to 05.20 from June 4th.

## 2018-08-15 DIAGNOSIS — I10 Essential (primary) hypertension: Secondary | ICD-10-CM | POA: Diagnosis not present

## 2018-08-15 DIAGNOSIS — M1612 Unilateral primary osteoarthritis, left hip: Secondary | ICD-10-CM | POA: Diagnosis not present

## 2018-08-15 DIAGNOSIS — K219 Gastro-esophageal reflux disease without esophagitis: Secondary | ICD-10-CM | POA: Diagnosis not present

## 2018-08-15 DIAGNOSIS — F419 Anxiety disorder, unspecified: Secondary | ICD-10-CM | POA: Diagnosis not present

## 2018-08-15 DIAGNOSIS — R32 Unspecified urinary incontinence: Secondary | ICD-10-CM | POA: Diagnosis not present

## 2018-08-15 DIAGNOSIS — I4891 Unspecified atrial fibrillation: Secondary | ICD-10-CM | POA: Diagnosis not present

## 2018-08-16 ENCOUNTER — Other Ambulatory Visit: Payer: Self-pay

## 2018-08-16 ENCOUNTER — Telehealth (INDEPENDENT_AMBULATORY_CARE_PROVIDER_SITE_OTHER): Payer: Medicare Other | Admitting: Internal Medicine

## 2018-08-16 DIAGNOSIS — I4821 Permanent atrial fibrillation: Secondary | ICD-10-CM | POA: Diagnosis not present

## 2018-08-16 DIAGNOSIS — Z7901 Long term (current) use of anticoagulants: Secondary | ICD-10-CM | POA: Diagnosis not present

## 2018-08-16 DIAGNOSIS — R32 Unspecified urinary incontinence: Secondary | ICD-10-CM | POA: Diagnosis not present

## 2018-08-16 DIAGNOSIS — N183 Chronic kidney disease, stage 3 (moderate): Secondary | ICD-10-CM | POA: Diagnosis not present

## 2018-08-16 DIAGNOSIS — I5032 Chronic diastolic (congestive) heart failure: Secondary | ICD-10-CM | POA: Diagnosis not present

## 2018-08-16 DIAGNOSIS — Z950 Presence of cardiac pacemaker: Secondary | ICD-10-CM | POA: Diagnosis not present

## 2018-08-16 DIAGNOSIS — I10 Essential (primary) hypertension: Secondary | ICD-10-CM | POA: Diagnosis not present

## 2018-08-16 DIAGNOSIS — I4891 Unspecified atrial fibrillation: Secondary | ICD-10-CM | POA: Diagnosis not present

## 2018-08-16 DIAGNOSIS — F419 Anxiety disorder, unspecified: Secondary | ICD-10-CM | POA: Diagnosis not present

## 2018-08-16 DIAGNOSIS — K219 Gastro-esophageal reflux disease without esophagitis: Secondary | ICD-10-CM | POA: Diagnosis not present

## 2018-08-16 DIAGNOSIS — I130 Hypertensive heart and chronic kidney disease with heart failure and stage 1 through stage 4 chronic kidney disease, or unspecified chronic kidney disease: Secondary | ICD-10-CM | POA: Diagnosis not present

## 2018-08-16 DIAGNOSIS — M545 Low back pain: Secondary | ICD-10-CM | POA: Diagnosis not present

## 2018-08-16 DIAGNOSIS — R609 Edema, unspecified: Secondary | ICD-10-CM | POA: Diagnosis not present

## 2018-08-16 DIAGNOSIS — M1612 Unilateral primary osteoarthritis, left hip: Secondary | ICD-10-CM | POA: Diagnosis not present

## 2018-08-16 DIAGNOSIS — R112 Nausea with vomiting, unspecified: Secondary | ICD-10-CM | POA: Diagnosis not present

## 2018-08-16 DIAGNOSIS — E871 Hypo-osmolality and hyponatremia: Secondary | ICD-10-CM | POA: Diagnosis not present

## 2018-08-16 NOTE — Progress Notes (Signed)
Electrophysiology TeleHealth Note   Due to national recommendations of social distancing due to COVID 19, an audio/video telehealth visit is felt to be most appropriate for this patient at this time.  See MyChart message from today for the patient's consent to telehealth for [facility].   Date:  08/16/2018   ID:  [name], DOB 12-02-1925, MRN [id number]  Location: patient's home  Provider location: [address], [place]  Evaluation Performed: Follow-up visit  PCP:  Tisovec, Fransico Him, MD  Cardiologist:  No primary care provider on file.  Electrophysiologist:  Dr Lovena Le  Chief Complaint:  "I am doing ok."  History of Present Illness:    [name] is a 85 y.o. female who presents via audio/video conferencing for a telehealth visit today. She is a pleasant elderly woman with chronic atrial fib, HTN, diastolic heart failure, venous insufficiency, and CHB, s/p PPM insertion. She has fallen and had severe pain in her hip. She has not had syncope. She is pending a hip injection tomorrow. She had her HCTZ stopped. She has some peripheral edema.   The patient denies symptoms of fevers, chills, cough, or new SOB worrisome for COVID 19.  Past Medical History:  Diagnosis Date  . Anxiety   . Atrial fibrillation ([place])   . Bradycardia   . Cancer ([place])    skin  . Cataract    removed bilat with lens both eyes   . Complication of anesthesia   . Difficulty sleeping   . Dysrhythmia    A FIB  . GERD (gastroesophageal reflux disease)   . H/O hiatal hernia   . History of kidney stones   . History of skin cancer   . History of transfusion   . Hyperlipidemia   . Hypertension   . Osteoarthritis   . Pacemaker   . Pneumonia   . PONV (postoperative nausea and vomiting)   . Shortness of breath    WITH EXERTION    Past Surgical History:  Procedure Laterality Date  . APPENDECTOMY  1960  . ARTHROPLASTY  1994   Left total knee  . ARTHROPLASTY  2000   Total right knee  . BACK SURGERY    . CATARACT EXTRACTION, BILATERAL     with lens implants  . CHOLECYSTECTOMY    . COLONOSCOPY    . DG SELECTED HSG GDC ONLY  2002   Dilation  . Microdiskectomy  05/2004   Left L4-L5  . PACEMAKER INSERTION    . POLYPECTOMY    . TOTAL ABDOMINAL HYSTERECTOMY  1969  . TOTAL HIP ARTHROPLASTY Right 12/24/2013   Procedure: RIGHT TOTAL HIP ARTHROPLASTY ANTERIOR APPROACH;  Surgeon: Gearlean Alf, MD;  Location: [facility];  Service: Orthopedics;  Laterality: Right;  . UPPER GASTROINTESTINAL ENDOSCOPY      Current Outpatient Medications  Medication Sig Dispense Refill  . acetaminophen (TYLENOL) 325 MG tablet Take 650 mg by mouth every 6 (six) hours as needed for mild pain.     Marland Kitchen amLODipine (NORVASC) 10 MG tablet Take 10 mg by mouth every morning.     . clonazePAM (KLONOPIN) 0.5 MG tablet Take 0.5 mg by mouth at bedtime as needed for anxiety.     . docusate sodium (COLACE) 100 MG capsule Take 100 mg by mouth daily as needed for mild constipation.    . famotidine (PEPCID) 20 MG tablet Take 1 tablet (20 mg total) by mouth 2 (two) times daily. Take every AM and at  bedtime (Patient taking differently: Take 20 mg by mouth 2 (two) times daily. ) 60 tablet 11  . feeding supplement, ENSURE ENLIVE, (ENSURE ENLIVE) LIQD Take 237 mLs by mouth 2 (two) times daily between meals. 60 Bottle 0  . irbesartan (AVAPRO) 300 MG tablet Take 300 mg by mouth every morning.    . Lidocaine (HM LIDOCAINE PATCH) 4 % PTCH Apply 1 patch topically daily. 10 patch 0  . methimazole (TAPAZOLE) 5 MG tablet Take 1 tablet (5 mg total) by mouth daily.    Marland Kitchen omeprazole (PRILOSEC) 20 MG capsule Take 20 mg by mouth daily. 30-60 minutes before dinner.    . ondansetron (ZOFRAN) 4 MG tablet Take 1 tablet (4 mg total) by mouth every 4 (four) hours as needed for nausea or vomiting. (Patient taking differently: Take 2 mg by mouth every 4 (four) hours as needed for nausea or vomiting. ) 20 tablet 1  . potassium  chloride SA (K-DUR) 20 MEQ tablet Take 20 mEq by mouth 2 (two) times daily.    . pravastatin (PRAVACHOL) 40 MG tablet Take 40 mg by mouth every morning.     . predniSONE (STERAPRED UNI-PAK 21 TAB) 10 MG (21) TBPK tablet Follow the direction on the pack. Start on 08/12/2018 21 tablet 0  . traMADol (ULTRAM) 50 MG tablet Take 25 mg by mouth every 6 (six) hours as needed for moderate pain.    Marland Kitchen warfarin (COUMADIN) 2 MG tablet Take 1-2 tablets (2-4 mg total) by mouth See admin instructions. Take 4 mg by mouth on MWF and 2 mg all other days (Patient taking differently: Take 2-4 mg by mouth See admin instructions. Take 4 mg by mouth on MWF and 2 mg all other days.)     No current facility-administered medications for this visit.     Allergies:   Morphine and related   Social History:  The patient  reports that she quit smoking about 44 years ago. She has never used smokeless tobacco. She reports that she does not drink alcohol or use drugs.   Family History:  The patient's  family history includes Heart disease in her mother; Leukemia in her father.   ROS:  Please see the history of present illness.   All other systems are personally reviewed and negative.    Exam:    Vital Signs:  BP - 143/68, P - 62, O2 sat - 100%   Labs/Other Tests and Data Reviewed:    Recent Labs: 08/09/2018: B Natriuretic Peptide 309.9; Platelets 330 08/10/2018: TSH 6.849 08/11/2018: ALT 13; BUN 11; Creatinine, Ser 0.89; Hemoglobin 9.8; Magnesium 1.9; Potassium 4.1; Sodium 127   Wt Readings from Last 3 Encounters:  08/12/18 156 lb (70.8 kg)  08/11/18 156 lb (70.8 kg)  07/19/18 156 lb 1.4 oz (70.8 kg)     Other studies personally reviewed:  Last device remote is reviewed from [facility] PDF dated 8/19 which reveals normal device function, no arrhythmias except atrial fib   ASSESSMENT & PLAN:    1.  Atrial fib - her rates are controlled. She will continue Warfarin. 2. Chronic diastolic heart failure -her dyspnea is  at baseline. Her grandaughter notes that she has nocturnal edema. I have encouraged her to take the HCTZ only when her legs are swollen in the a.m. or when she feels sob. 3. PPM - her [employer] PPM was working normally. We will recheck in several months. 4. HTN - her blood pressure is reasonably well controlled. Because of her propensity  to fall, I would prefer she be on the high side rather than low. 5.COVID 19 screen The patient denies symptoms of COVID 19 at this time.  The importance of social distancing was discussed today.  Follow-up:  4 months with me Next remote: n/a  Current medicines are reviewed at length with the patient today.   The patient has concerns regarding her medicines.  The following changes were made today:  none  Labs/ tests ordered today include: none No orders of the defined types were placed in this encounter.    Patient Risk:  after full review of this patients clinical status, I feel that they are at moderate risk at this time.  Today, I have spent 25 minutes with the patient with telehealth technology discussing all of the above .    Signed, Cristopher Peru, MD  08/16/2018 12:23 PM     [place] [address] [place] [address] [phone] (office) [phone] (fax)

## 2018-08-17 DIAGNOSIS — M1612 Unilateral primary osteoarthritis, left hip: Secondary | ICD-10-CM | POA: Diagnosis not present

## 2018-08-17 DIAGNOSIS — I4891 Unspecified atrial fibrillation: Secondary | ICD-10-CM | POA: Diagnosis not present

## 2018-08-17 DIAGNOSIS — K219 Gastro-esophageal reflux disease without esophagitis: Secondary | ICD-10-CM | POA: Diagnosis not present

## 2018-08-17 DIAGNOSIS — R32 Unspecified urinary incontinence: Secondary | ICD-10-CM | POA: Diagnosis not present

## 2018-08-17 DIAGNOSIS — F419 Anxiety disorder, unspecified: Secondary | ICD-10-CM | POA: Diagnosis not present

## 2018-08-17 DIAGNOSIS — I10 Essential (primary) hypertension: Secondary | ICD-10-CM | POA: Diagnosis not present

## 2018-08-18 DIAGNOSIS — R32 Unspecified urinary incontinence: Secondary | ICD-10-CM | POA: Diagnosis not present

## 2018-08-18 DIAGNOSIS — E871 Hypo-osmolality and hyponatremia: Secondary | ICD-10-CM | POA: Diagnosis not present

## 2018-08-18 DIAGNOSIS — K219 Gastro-esophageal reflux disease without esophagitis: Secondary | ICD-10-CM | POA: Diagnosis not present

## 2018-08-18 DIAGNOSIS — I10 Essential (primary) hypertension: Secondary | ICD-10-CM | POA: Diagnosis not present

## 2018-08-18 DIAGNOSIS — M1612 Unilateral primary osteoarthritis, left hip: Secondary | ICD-10-CM | POA: Diagnosis not present

## 2018-08-18 DIAGNOSIS — Z7901 Long term (current) use of anticoagulants: Secondary | ICD-10-CM | POA: Diagnosis not present

## 2018-08-18 DIAGNOSIS — F419 Anxiety disorder, unspecified: Secondary | ICD-10-CM | POA: Diagnosis not present

## 2018-08-18 DIAGNOSIS — I4891 Unspecified atrial fibrillation: Secondary | ICD-10-CM | POA: Diagnosis not present

## 2018-08-22 DIAGNOSIS — F419 Anxiety disorder, unspecified: Secondary | ICD-10-CM | POA: Diagnosis not present

## 2018-08-22 DIAGNOSIS — I10 Essential (primary) hypertension: Secondary | ICD-10-CM | POA: Diagnosis not present

## 2018-08-22 DIAGNOSIS — R32 Unspecified urinary incontinence: Secondary | ICD-10-CM | POA: Diagnosis not present

## 2018-08-22 DIAGNOSIS — K219 Gastro-esophageal reflux disease without esophagitis: Secondary | ICD-10-CM | POA: Diagnosis not present

## 2018-08-22 DIAGNOSIS — I4891 Unspecified atrial fibrillation: Secondary | ICD-10-CM | POA: Diagnosis not present

## 2018-08-22 DIAGNOSIS — M1612 Unilateral primary osteoarthritis, left hip: Secondary | ICD-10-CM | POA: Diagnosis not present

## 2018-08-24 DIAGNOSIS — R32 Unspecified urinary incontinence: Secondary | ICD-10-CM | POA: Diagnosis not present

## 2018-08-24 DIAGNOSIS — I4891 Unspecified atrial fibrillation: Secondary | ICD-10-CM | POA: Diagnosis not present

## 2018-08-24 DIAGNOSIS — M1612 Unilateral primary osteoarthritis, left hip: Secondary | ICD-10-CM | POA: Diagnosis not present

## 2018-08-24 DIAGNOSIS — F419 Anxiety disorder, unspecified: Secondary | ICD-10-CM | POA: Diagnosis not present

## 2018-08-24 DIAGNOSIS — K219 Gastro-esophageal reflux disease without esophagitis: Secondary | ICD-10-CM | POA: Diagnosis not present

## 2018-08-24 DIAGNOSIS — I10 Essential (primary) hypertension: Secondary | ICD-10-CM | POA: Diagnosis not present

## 2018-08-25 DIAGNOSIS — K219 Gastro-esophageal reflux disease without esophagitis: Secondary | ICD-10-CM | POA: Diagnosis not present

## 2018-08-25 DIAGNOSIS — I4891 Unspecified atrial fibrillation: Secondary | ICD-10-CM | POA: Diagnosis not present

## 2018-08-25 DIAGNOSIS — R32 Unspecified urinary incontinence: Secondary | ICD-10-CM | POA: Diagnosis not present

## 2018-08-25 DIAGNOSIS — M1612 Unilateral primary osteoarthritis, left hip: Secondary | ICD-10-CM | POA: Diagnosis not present

## 2018-08-25 DIAGNOSIS — I10 Essential (primary) hypertension: Secondary | ICD-10-CM | POA: Diagnosis not present

## 2018-08-25 DIAGNOSIS — F419 Anxiety disorder, unspecified: Secondary | ICD-10-CM | POA: Diagnosis not present

## 2018-08-28 DIAGNOSIS — I4891 Unspecified atrial fibrillation: Secondary | ICD-10-CM | POA: Diagnosis not present

## 2018-08-28 DIAGNOSIS — K219 Gastro-esophageal reflux disease without esophagitis: Secondary | ICD-10-CM | POA: Diagnosis not present

## 2018-08-28 DIAGNOSIS — M1612 Unilateral primary osteoarthritis, left hip: Secondary | ICD-10-CM | POA: Diagnosis not present

## 2018-08-28 DIAGNOSIS — F419 Anxiety disorder, unspecified: Secondary | ICD-10-CM | POA: Diagnosis not present

## 2018-08-28 DIAGNOSIS — R32 Unspecified urinary incontinence: Secondary | ICD-10-CM | POA: Diagnosis not present

## 2018-08-28 DIAGNOSIS — I10 Essential (primary) hypertension: Secondary | ICD-10-CM | POA: Diagnosis not present

## 2018-08-29 DIAGNOSIS — F419 Anxiety disorder, unspecified: Secondary | ICD-10-CM | POA: Diagnosis not present

## 2018-08-29 DIAGNOSIS — R32 Unspecified urinary incontinence: Secondary | ICD-10-CM | POA: Diagnosis not present

## 2018-08-29 DIAGNOSIS — K219 Gastro-esophageal reflux disease without esophagitis: Secondary | ICD-10-CM | POA: Diagnosis not present

## 2018-08-29 DIAGNOSIS — M25552 Pain in left hip: Secondary | ICD-10-CM | POA: Diagnosis not present

## 2018-08-29 DIAGNOSIS — I4891 Unspecified atrial fibrillation: Secondary | ICD-10-CM | POA: Diagnosis not present

## 2018-08-29 DIAGNOSIS — I10 Essential (primary) hypertension: Secondary | ICD-10-CM | POA: Diagnosis not present

## 2018-08-29 DIAGNOSIS — M1612 Unilateral primary osteoarthritis, left hip: Secondary | ICD-10-CM | POA: Diagnosis not present

## 2018-08-30 DIAGNOSIS — F419 Anxiety disorder, unspecified: Secondary | ICD-10-CM | POA: Diagnosis not present

## 2018-08-30 DIAGNOSIS — R32 Unspecified urinary incontinence: Secondary | ICD-10-CM | POA: Diagnosis not present

## 2018-08-30 DIAGNOSIS — I4891 Unspecified atrial fibrillation: Secondary | ICD-10-CM | POA: Diagnosis not present

## 2018-08-30 DIAGNOSIS — K219 Gastro-esophageal reflux disease without esophagitis: Secondary | ICD-10-CM | POA: Diagnosis not present

## 2018-08-30 DIAGNOSIS — I10 Essential (primary) hypertension: Secondary | ICD-10-CM | POA: Diagnosis not present

## 2018-08-30 DIAGNOSIS — M1612 Unilateral primary osteoarthritis, left hip: Secondary | ICD-10-CM | POA: Diagnosis not present

## 2018-08-31 ENCOUNTER — Telehealth: Payer: Medicare Other | Admitting: Internal Medicine

## 2018-08-31 DIAGNOSIS — M1612 Unilateral primary osteoarthritis, left hip: Secondary | ICD-10-CM | POA: Diagnosis not present

## 2018-08-31 DIAGNOSIS — R32 Unspecified urinary incontinence: Secondary | ICD-10-CM | POA: Diagnosis not present

## 2018-08-31 DIAGNOSIS — I4891 Unspecified atrial fibrillation: Secondary | ICD-10-CM | POA: Diagnosis not present

## 2018-08-31 DIAGNOSIS — I10 Essential (primary) hypertension: Secondary | ICD-10-CM | POA: Diagnosis not present

## 2018-08-31 DIAGNOSIS — K219 Gastro-esophageal reflux disease without esophagitis: Secondary | ICD-10-CM | POA: Diagnosis not present

## 2018-08-31 DIAGNOSIS — F419 Anxiety disorder, unspecified: Secondary | ICD-10-CM | POA: Diagnosis not present

## 2018-09-01 DIAGNOSIS — I4891 Unspecified atrial fibrillation: Secondary | ICD-10-CM | POA: Diagnosis not present

## 2018-09-01 DIAGNOSIS — M1612 Unilateral primary osteoarthritis, left hip: Secondary | ICD-10-CM | POA: Diagnosis not present

## 2018-09-01 DIAGNOSIS — R32 Unspecified urinary incontinence: Secondary | ICD-10-CM | POA: Diagnosis not present

## 2018-09-01 DIAGNOSIS — F419 Anxiety disorder, unspecified: Secondary | ICD-10-CM | POA: Diagnosis not present

## 2018-09-01 DIAGNOSIS — I10 Essential (primary) hypertension: Secondary | ICD-10-CM | POA: Diagnosis not present

## 2018-09-01 DIAGNOSIS — K219 Gastro-esophageal reflux disease without esophagitis: Secondary | ICD-10-CM | POA: Diagnosis not present

## 2018-09-06 DIAGNOSIS — F419 Anxiety disorder, unspecified: Secondary | ICD-10-CM | POA: Diagnosis not present

## 2018-09-06 DIAGNOSIS — R32 Unspecified urinary incontinence: Secondary | ICD-10-CM | POA: Diagnosis not present

## 2018-09-06 DIAGNOSIS — I4891 Unspecified atrial fibrillation: Secondary | ICD-10-CM | POA: Diagnosis not present

## 2018-09-06 DIAGNOSIS — I10 Essential (primary) hypertension: Secondary | ICD-10-CM | POA: Diagnosis not present

## 2018-09-06 DIAGNOSIS — M1612 Unilateral primary osteoarthritis, left hip: Secondary | ICD-10-CM | POA: Diagnosis not present

## 2018-09-06 DIAGNOSIS — K219 Gastro-esophageal reflux disease without esophagitis: Secondary | ICD-10-CM | POA: Diagnosis not present

## 2018-09-22 DIAGNOSIS — I4821 Permanent atrial fibrillation: Secondary | ICD-10-CM | POA: Diagnosis not present

## 2018-09-22 DIAGNOSIS — Z7901 Long term (current) use of anticoagulants: Secondary | ICD-10-CM | POA: Diagnosis not present

## 2018-09-28 DIAGNOSIS — Z7901 Long term (current) use of anticoagulants: Secondary | ICD-10-CM | POA: Diagnosis not present

## 2018-09-28 DIAGNOSIS — M47812 Spondylosis without myelopathy or radiculopathy, cervical region: Secondary | ICD-10-CM | POA: Diagnosis not present

## 2018-09-28 DIAGNOSIS — I4821 Permanent atrial fibrillation: Secondary | ICD-10-CM | POA: Diagnosis not present

## 2018-10-02 DIAGNOSIS — M50121 Cervical disc disorder at C4-C5 level with radiculopathy: Secondary | ICD-10-CM | POA: Diagnosis not present

## 2018-10-02 DIAGNOSIS — Z7901 Long term (current) use of anticoagulants: Secondary | ICD-10-CM | POA: Diagnosis not present

## 2018-10-02 DIAGNOSIS — I4821 Permanent atrial fibrillation: Secondary | ICD-10-CM | POA: Diagnosis not present

## 2018-10-03 DIAGNOSIS — R001 Bradycardia, unspecified: Secondary | ICD-10-CM | POA: Diagnosis not present

## 2018-10-05 DIAGNOSIS — E7800 Pure hypercholesterolemia, unspecified: Secondary | ICD-10-CM | POA: Diagnosis not present

## 2018-10-05 DIAGNOSIS — I10 Essential (primary) hypertension: Secondary | ICD-10-CM | POA: Diagnosis not present

## 2018-10-06 DIAGNOSIS — R82998 Other abnormal findings in urine: Secondary | ICD-10-CM | POA: Diagnosis not present

## 2018-10-06 DIAGNOSIS — I10 Essential (primary) hypertension: Secondary | ICD-10-CM | POA: Diagnosis not present

## 2018-10-09 ENCOUNTER — Other Ambulatory Visit: Payer: Self-pay | Admitting: Internal Medicine

## 2018-10-12 DIAGNOSIS — I130 Hypertensive heart and chronic kidney disease with heart failure and stage 1 through stage 4 chronic kidney disease, or unspecified chronic kidney disease: Secondary | ICD-10-CM | POA: Diagnosis not present

## 2018-10-12 DIAGNOSIS — Z0000 Encounter for general adult medical examination without abnormal findings: Secondary | ICD-10-CM | POA: Diagnosis not present

## 2018-10-12 DIAGNOSIS — K219 Gastro-esophageal reflux disease without esophagitis: Secondary | ICD-10-CM | POA: Diagnosis not present

## 2018-10-12 DIAGNOSIS — E0520 Thyrotoxicosis with toxic multinodular goiter without thyrotoxic crisis or storm: Secondary | ICD-10-CM | POA: Diagnosis not present

## 2018-10-12 DIAGNOSIS — I679 Cerebrovascular disease, unspecified: Secondary | ICD-10-CM | POA: Diagnosis not present

## 2018-10-12 DIAGNOSIS — G629 Polyneuropathy, unspecified: Secondary | ICD-10-CM | POA: Diagnosis not present

## 2018-10-12 DIAGNOSIS — N183 Chronic kidney disease, stage 3 (moderate): Secondary | ICD-10-CM | POA: Diagnosis not present

## 2018-10-12 DIAGNOSIS — M545 Low back pain: Secondary | ICD-10-CM | POA: Diagnosis not present

## 2018-10-12 DIAGNOSIS — I5032 Chronic diastolic (congestive) heart failure: Secondary | ICD-10-CM | POA: Diagnosis not present

## 2018-10-12 DIAGNOSIS — Z7901 Long term (current) use of anticoagulants: Secondary | ICD-10-CM | POA: Diagnosis not present

## 2018-10-12 DIAGNOSIS — Z950 Presence of cardiac pacemaker: Secondary | ICD-10-CM | POA: Diagnosis not present

## 2018-10-12 DIAGNOSIS — I4821 Permanent atrial fibrillation: Secondary | ICD-10-CM | POA: Diagnosis not present

## 2018-10-16 ENCOUNTER — Emergency Department (HOSPITAL_COMMUNITY): Payer: Medicare Other

## 2018-10-16 ENCOUNTER — Encounter (HOSPITAL_COMMUNITY): Payer: Self-pay | Admitting: Emergency Medicine

## 2018-10-16 ENCOUNTER — Emergency Department (HOSPITAL_COMMUNITY)
Admission: EM | Admit: 2018-10-16 | Discharge: 2018-10-16 | Disposition: A | Discharge status: Home / Self Care | Payer: Medicare Other | Attending: Emergency Medicine | Admitting: Emergency Medicine

## 2018-10-16 ENCOUNTER — Other Ambulatory Visit: Payer: Self-pay

## 2018-10-16 DIAGNOSIS — I10 Essential (primary) hypertension: Secondary | ICD-10-CM | POA: Diagnosis not present

## 2018-10-16 DIAGNOSIS — R1110 Vomiting, unspecified: Secondary | ICD-10-CM | POA: Diagnosis not present

## 2018-10-16 DIAGNOSIS — R0902 Hypoxemia: Secondary | ICD-10-CM | POA: Diagnosis not present

## 2018-10-16 DIAGNOSIS — Z87891 Personal history of nicotine dependence: Secondary | ICD-10-CM | POA: Insufficient documentation

## 2018-10-16 DIAGNOSIS — R1084 Generalized abdominal pain: Secondary | ICD-10-CM | POA: Insufficient documentation

## 2018-10-16 DIAGNOSIS — R112 Nausea with vomiting, unspecified: Secondary | ICD-10-CM | POA: Diagnosis not present

## 2018-10-16 DIAGNOSIS — Z20828 Contact with and (suspected) exposure to other viral communicable diseases: Secondary | ICD-10-CM | POA: Insufficient documentation

## 2018-10-16 DIAGNOSIS — Z96641 Presence of right artificial hip joint: Secondary | ICD-10-CM | POA: Insufficient documentation

## 2018-10-16 DIAGNOSIS — R531 Weakness: Secondary | ICD-10-CM

## 2018-10-16 DIAGNOSIS — Z950 Presence of cardiac pacemaker: Secondary | ICD-10-CM | POA: Insufficient documentation

## 2018-10-16 DIAGNOSIS — M6281 Muscle weakness (generalized): Secondary | ICD-10-CM | POA: Diagnosis not present

## 2018-10-16 DIAGNOSIS — Z85828 Personal history of other malignant neoplasm of skin: Secondary | ICD-10-CM | POA: Diagnosis not present

## 2018-10-16 DIAGNOSIS — R0602 Shortness of breath: Secondary | ICD-10-CM | POA: Diagnosis not present

## 2018-10-16 LAB — URINALYSIS, ROUTINE W REFLEX MICROSCOPIC
Bilirubin Urine: NEGATIVE
Glucose, UA: NEGATIVE mg/dL
Hgb urine dipstick: NEGATIVE
Ketones, ur: NEGATIVE mg/dL
Leukocytes,Ua: NEGATIVE
Nitrite: NEGATIVE
Protein, ur: NEGATIVE mg/dL
Specific Gravity, Urine: 1.01 (ref 1.005–1.030)
pH: 6 (ref 5.0–8.0)

## 2018-10-16 LAB — COMPREHENSIVE METABOLIC PANEL
ALT: 16 U/L (ref 0–44)
AST: 17 U/L (ref 15–41)
Albumin: 3.6 g/dL (ref 3.5–5.0)
Alkaline Phosphatase: 76 U/L (ref 38–126)
Anion gap: 10 (ref 5–15)
BUN: 17 mg/dL (ref 8–23)
CO2: 25 mmol/L (ref 22–32)
Calcium: 9.2 mg/dL (ref 8.9–10.3)
Chloride: 97 mmol/L — ABNORMAL LOW (ref 98–111)
Creatinine, Ser: 0.75 mg/dL (ref 0.44–1.00)
GFR calc Af Amer: >60 mL/min (ref >60)
GFR calc non Af Amer: >60 mL/min (ref >60)
Glucose, Bld: 107 mg/dL — ABNORMAL HIGH (ref 70–99)
Potassium: 4.1 mmol/L (ref 3.5–5.1)
Sodium: 132 mmol/L — ABNORMAL LOW (ref 135–145)
Total Bilirubin: 0.4 mg/dL (ref 0.3–1.2)
Total Protein: 6.5 g/dL (ref 6.5–8.1)

## 2018-10-16 LAB — CBC WITH DIFFERENTIAL/PLATELET
Abs Immature Granulocytes: 0.05 10*3/uL (ref 0.00–0.07)
Basophils Absolute: 0 10*3/uL (ref 0.0–0.1)
Basophils Relative: 0 %
Eosinophils Absolute: 0.1 10*3/uL (ref 0.0–0.5)
Eosinophils Relative: 1 %
HCT: 33 % — ABNORMAL LOW (ref 36.0–46.0)
Hemoglobin: 10.3 g/dL — ABNORMAL LOW (ref 12.0–15.0)
Immature Granulocytes: 1 %
Lymphocytes Relative: 7 %
Lymphs Abs: 0.6 10*3/uL — ABNORMAL LOW (ref 0.7–4.0)
MCH: 26.5 pg (ref 26.0–34.0)
MCHC: 31.2 g/dL (ref 30.0–36.0)
MCV: 84.8 fL (ref 80.0–100.0)
Monocytes Absolute: 0.7 10*3/uL (ref 0.1–1.0)
Monocytes Relative: 9 %
Neutro Abs: 7 10*3/uL (ref 1.7–7.7)
Neutrophils Relative %: 82 %
Platelets: 315 10*3/uL (ref 150–400)
RBC: 3.89 MIL/uL (ref 3.87–5.11)
RDW: 15.3 % (ref 11.5–15.5)
WBC: 8.5 10*3/uL (ref 4.0–10.5)
nRBC: 0 % (ref 0.0–0.2)

## 2018-10-16 LAB — PROTIME-INR
INR: 1.8 — ABNORMAL HIGH (ref 0.8–1.2)
Prothrombin Time: 21 seconds — ABNORMAL HIGH (ref 11.4–15.2)

## 2018-10-16 LAB — SARS CORONAVIRUS 2 BY RT PCR (HOSPITAL ORDER, PERFORMED IN ~~LOC~~ HOSPITAL LAB): SARS Coronavirus 2: NEGATIVE

## 2018-10-16 LAB — LIPASE, BLOOD: Lipase: 38 U/L (ref 11–51)

## 2018-10-16 LAB — TROPONIN I (HIGH SENSITIVITY): Troponin I (High Sensitivity): 7 ng/L (ref <18)

## 2018-10-16 MED ORDER — ONDANSETRON 4 MG PO TBDP: 4.0000 mg | ORAL_TABLET | Freq: Three times a day (TID) | ORAL | 0 refills | Status: DC | PRN

## 2018-10-16 MED ORDER — ONDANSETRON HCL 4 MG/2ML IJ SOLN
4.0000 mg | Freq: Once | INTRAMUSCULAR | Status: AC
  Administered 2018-10-16: 4 mg via INTRAVENOUS
  Filled 2018-10-16: qty 2

## 2018-10-16 MED ORDER — AMLODIPINE BESYLATE 5 MG PO TABS
10.0000 mg | ORAL_TABLET | Freq: Once | ORAL | Status: AC
  Administered 2018-10-16: 10 mg via ORAL
  Filled 2018-10-16: qty 2

## 2018-10-16 MED ORDER — SODIUM CHLORIDE (PF) 0.9 % IJ SOLN
INTRAMUSCULAR | Status: AC
  Filled 2018-10-16: qty 50

## 2018-10-16 MED ORDER — SODIUM CHLORIDE 0.9 % IV BOLUS
500.0000 mL | Freq: Once | INTRAVENOUS | Status: AC
  Administered 2018-10-16: 08:00:00 500 mL via INTRAVENOUS

## 2018-10-16 MED ORDER — IOHEXOL 300 MG/ML  SOLN
100.0000 mL | Freq: Once | INTRAMUSCULAR | Status: AC | PRN
  Administered 2018-10-16: 10:00:00 100 mL via INTRAVENOUS

## 2018-10-16 NOTE — ED Notes (Signed)
I ambulating patient to the restroom room.  She did a great job however patient stated she nausea when she walks

## 2018-10-16 NOTE — ED Triage Notes (Signed)
Per EMS pt complaint of weakness for 3 days; one episode vomiting last night; still nauseous.

## 2018-10-16 NOTE — ED Notes (Signed)
X-ray at bedside

## 2018-10-16 NOTE — ED Provider Notes (Signed)
Emergency Department Provider Note   I have reviewed the triage vital signs and the nursing notes.   HISTORY  Chief Complaint Weakness   HPI [name] is a 84 y.o. female with PMH of A-fib, HLD, and HTN presents to the emergency department for evaluation of generalized weakness with associated nausea.  Symptoms have been ongoing for the past 3 days.  She reports some mild, diffuse abdominal pain which she attributes to constipation.  She denies any chest pain but has noticed some mild shortness of breath.  She tells me that she spent the last 3 days basically in bed.  She lives by herself but has multiple family members checking on her and monitoring her closely.  She has not been leaving the house.  No known COVID exposure.  She denies any fevers or shaking chills.  No body aches, headaches, sore throat. No changes to medication.   Past Medical History:  Diagnosis Date  . Anxiety   . Atrial fibrillation ([place])   . Bradycardia   . Cancer ([place])    skin  . Cataract    removed bilat with lens both eyes   . Complication of anesthesia   . Difficulty sleeping   . Dysrhythmia    A FIB  . GERD (gastroesophageal reflux disease)   . H/O hiatal hernia   . History of kidney stones   . History of skin cancer   . History of transfusion   . Hyperlipidemia   . Hypertension   . Osteoarthritis   . Pacemaker   . Pneumonia   . PONV (postoperative nausea and vomiting)   . Shortness of breath    WITH EXERTION    Patient Active Problem List   Diagnosis Date Noted  . V tach ([place]) 08/09/2018  . Intractable nausea and vomiting 08/09/2018  . Hyponatremia 08/09/2018  . Hypomagnesemia 08/09/2018  . Hyperthyroidism 08/09/2018  . Epigastric pain   . Nonspecific chest pain 07/18/2018  . Anxiety 07/18/2018  . Insomnia 07/18/2018  . Complication of anesthesia   . Intractable headache 01/06/2017  . Intractable pain 01/04/2017  . Chronic anticoagulation 01/03/2017  . Neck pain  01/03/2017  . Acute bronchitis 06/08/2016  . Fall 09/24/2015  . Dizziness 09/24/2015  . Postoperative anemia due to acute blood loss 12/27/2013  . Postop Transfusion 12/27/2013  . OA (osteoarthritis) of hip 12/24/2013  . Preop cardiovascular exam 09/25/2013  . Cardiac pacemaker in situ 08/29/2008  . Essential hypertension 08/21/2008  . ATRIAL FIBRILLATION 08/21/2008  . Bradycardia 08/21/2008  . PNEUMONIA 08/21/2008  . GERD 08/21/2008  . NEPHROLITHIASIS 08/21/2008  . Osteoarthritis 08/21/2008    Past Surgical History:  Procedure Laterality Date  . APPENDECTOMY  1960  . ARTHROPLASTY  1994   Left total knee  . ARTHROPLASTY  2000   Total right knee  . BACK SURGERY    . CATARACT EXTRACTION, BILATERAL     with lens implants  . CHOLECYSTECTOMY    . COLONOSCOPY    . DG SELECTED HSG GDC ONLY  2002   Dilation  . Microdiskectomy  05/2004   Left L4-L5  . PACEMAKER INSERTION    . POLYPECTOMY    . TOTAL ABDOMINAL HYSTERECTOMY  1969  . TOTAL HIP ARTHROPLASTY Right 12/24/2013   Procedure: RIGHT TOTAL HIP ARTHROPLASTY ANTERIOR APPROACH;  Surgeon: Gearlean Alf, MD;  Location: [facility];  Service: Orthopedics;  Laterality: Right;  . UPPER GASTROINTESTINAL ENDOSCOPY      Allergies Morphine and related  Family  History  Problem Relation Age of Onset  . Heart disease Mother   . Leukemia Father   . Colon cancer Neg Hx   . Esophageal cancer Neg Hx   . Stomach cancer Neg Hx   . Pancreatic cancer Neg Hx   . Liver disease Neg Hx   . Colon polyps Neg Hx     Social History Social History   Tobacco Use  . Smoking status: Former Smoker    Quit date: 12/18/1973    Years since quitting: 44.8  . Smokeless tobacco: Never Used  . Tobacco comment: Smoker for 25 years and quit at around age 77  Substance Use Topics  . Alcohol use: No  . Drug use: No    Review of Systems  Constitutional: No fever/chills. Positive weakness.  Eyes: No visual changes. ENT: No sore throat.  Cardiovascular: Denies chest pain. Respiratory: Mild shortness of breath at times. Gastrointestinal: Positive mild abdominal pain. Positive nausea, no vomiting.  No diarrhea.  No constipation. Genitourinary: Negative for dysuria. Musculoskeletal: Negative for back pain. Skin: Negative for rash. Neurological: Negative for headaches, focal weakness or numbness.  10-point ROS otherwise negative.  ____________________________________________   PHYSICAL EXAM:  VITAL SIGNS: ED Triage Vitals  Enc Vitals Group     BP 10/16/18 0740 (!) 193/72     Pulse Rate 10/16/18 0740 60     Resp 10/16/18 0740 16     Temp 10/16/18 0740 98.7 F (37.1 C)     Temp Source 10/16/18 0740 Oral     SpO2 10/16/18 0732 96 %     Pain Score 10/16/18 0732 0   Constitutional: Alert and oriented. Well appearing and in no acute distress. Eyes: Conjunctivae are normal.  Head: Atraumatic. Nose: No congestion/rhinnorhea. Mouth/Throat: Mucous membranes are dry.  Neck: No stridor.  Cardiovascular: Normal rate, regular rhythm. Good peripheral circulation. Grossly normal heart sounds.   Respiratory: Normal respiratory effort.  No retractions. Lungs CTAB. Gastrointestinal: Soft with mild diffuse tenderness. No distention.  Musculoskeletal: No lower extremity tenderness nor edema. No gross deformities of extremities. Neurologic:  Normal speech and language. No gross focal neurologic deficits are appreciated.  Skin:  Skin is warm, dry and intact. No rash noted.   ____________________________________________   LABS (all labs ordered are listed, but only abnormal results are displayed)  Labs Reviewed  URINE CULTURE - Abnormal; Notable for the following components:      Result Value   Culture MULTIPLE SPECIES PRESENT, SUGGEST RECOLLECTION (*)    All other components within normal limits  COMPREHENSIVE METABOLIC PANEL - Abnormal; Notable for the following components:   Sodium 132 (*)    Chloride 97 (*)    Glucose,  Bld 107 (*)    All other components within normal limits  CBC WITH DIFFERENTIAL/PLATELET - Abnormal; Notable for the following components:   Hemoglobin 10.3 (*)    HCT 33.0 (*)    Lymphs Abs 0.6 (*)    All other components within normal limits  URINALYSIS, ROUTINE W REFLEX MICROSCOPIC - Abnormal; Notable for the following components:   APPearance HAZY (*)    Bacteria, UA RARE (*)    All other components within normal limits  PROTIME-INR - Abnormal; Notable for the following components:   Prothrombin Time 21.0 (*)    INR 1.8 (*)    All other components within normal limits  SARS CORONAVIRUS 2 (HOSPITAL ORDER, [facility])  LIPASE, BLOOD  TROPONIN I (HIGH SENSITIVITY)   ____________________________________________  EKG   EKG Interpretation  Date/Time:  Monday October 16 2018 07:53:17 EDT Ventricular Rate:  61 PR Interval:    QRS Duration: 107 QT Interval:  391 QTC Calculation: 404 R Axis:   74 Text Interpretation:  Ventricular-paced complexes No further analysis attempted due to paced rhythm Confirmed by Nanda Quinton [phone]) on 10/16/2018 8:01:45 AM Also confirmed by Nanda Quinton [phone]), editor Philomena Doheny [phone])  on 10/16/2018 8:22:13 AM       ____________________________________________  RADIOLOGY  Ct Abdomen Pelvis W Contrast  Result Date: 10/16/2018 CLINICAL DATA:  Weakness x3 days, vomiting, nausea EXAM: CT ABDOMEN AND PELVIS WITH CONTRAST TECHNIQUE: Multidetector CT imaging of the abdomen and pelvis was performed using the standard protocol following bolus administration of intravenous contrast. CONTRAST:  13mL OMNIPAQUE IOHEXOL 300 MG/ML  SOLN COMPARISON:  08/08/2018 FINDINGS: Lower chest: Cardiomegaly with biatrial enlargement. Coronary calcifications. Pacing lead extends to the RV apex. Coarse subpleural opacities in the visualized lung bases. No pleural or pericardial effusion. Hepatobiliary: 6 mm low-attenuation focus in hepatic segment 6  possibly cyst but incompletely characterized. No new liver lesion. No intrahepatic biliary ductal dilatation. Cholecystectomy clips. Ectatic CBD. Pancreas: Unremarkable. No pancreatic ductal dilatation or surrounding inflammatory changes. Spleen: Normal in size. 1 cm probable hemangioma image 13/2, stable. Adrenals/Urinary Tract: Normal adrenals. 2.6 cm exophytic right lower pole renal cyst. 3.6 cm left and 1.7 cm right hyperdense lesions, possibly proteinaceous or hemorrhagic cysts but incompletely characterized. No hydronephrosis. Urinary bladder physiologically distended. Stomach/Bowel: Stomach is nondilated. Small bowel decompressed. Appendix surgically absent. Moderate proximal colonic fecal material without dilatation. Distal colon and rectum decompressed. Vascular/Lymphatic: Aortoiliac moderate calcified atheromatous plaque without aneurysm or high-grade stenosis. No abdominal or pelvic adenopathy. Reproductive: Status post hysterectomy. No adnexal masses. Other: No ascites. Bilateral pelvic phleboliths. No free air. Musculoskeletal: Right hip arthroplasty resulting in some regional streak artifact. Multilevel spondylitic changes in the lumbar spine. No fracture or worrisome bone lesion. IMPRESSION: 1. No acute findings. 2. Coronary and aortoiliac Atherosclerosis (ICD10-I70.0). 3. Hyperdense renal lesions, incompletely characterized Electronically Signed   By: Lucrezia Europe M.D.   On: 10/16/2018 10:47    ____________________________________________   PROCEDURES  Procedure(s) performed:   Procedures  None ____________________________________________   INITIAL IMPRESSION / ASSESSMENT AND PLAN / ED COURSE  Pertinent labs & imaging results that were available during my care of the patient were reviewed by me and considered in my medical decision making (see chart for details).   Patient presents to the emergency department for evaluation of generalized weakness.  She has some associated nausea,  abdominal pain, mild shortness of breath.  Afebrile here with elevated blood pressure but no other significant abnormalities.  Differential is broad at this time and includes infectious, cardiac, structural abnormality in the abdomen such as bowel obstruction although lower suspicion for this overall.  Plan for CT imaging of the abdomen and pelvis, screening blood work.  EKG shows V paced rhythm which is similar to prior.  Chest x-ray ordered with intermittent shortness of breath.   CT reviewed with no acute findings. No UTI. Labs are largely unremarkable. Possible early viral illness. Patient's COVID is negative. Feeling much improved after IVF. Suspect some component of dehydration. Ambulatory in the ED without difficulty. Patient appears safe for discharge at this time. Discussed plan with her and she feels comfortable with this. Will call PCP today. Discussed ED return precautions.  ____________________________________________  FINAL CLINICAL IMPRESSION(S) / ED DIAGNOSES  Final diagnoses:  Generalized weakness  Generalized abdominal pain  MEDICATIONS GIVEN DURING THIS VISIT:  Medications  sodium chloride 0.9 % bolus 500 mL (0 mLs Intravenous Stopped 10/16/18 1042)  ondansetron (ZOFRAN) injection 4 mg (4 mg Intravenous Given 10/16/18 0806)  iohexol (OMNIPAQUE) 300 MG/ML solution 100 mL (100 mLs Intravenous Contrast Given 10/16/18 1013)  amLODipine (NORVASC) tablet 10 mg (10 mg Oral Given 10/16/18 1141)     NEW OUTPATIENT MEDICATIONS STARTED DURING THIS VISIT:  Discharge Medication List as of 10/16/2018 12:01 PM    START taking these medications   Details  ondansetron (ZOFRAN ODT) 4 MG disintegrating tablet Take 1 tablet (4 mg total) by mouth every 8 (eight) hours as needed for nausea or vomiting., Starting Mon 10/16/2018, Print        Note:  This document was prepared using Dragon voice recognition software and may include unintentional dictation errors.  Nanda Quinton, MD  Emergency Medicine    Long, [facility], MD 10/17/18 1019

## 2018-10-16 NOTE — ED Notes (Signed)
Patient has called her son. Son instructed to call hospital when he arrives to pick up patient. Pts son reported he would

## 2018-10-16 NOTE — Discharge Instructions (Addendum)
You were seen in the emergency department today with weakness and nausea with some abdominal pain.  Your CT scan and lab work were normal.  You do show some signs of mild dehydration and seem to have improved with some IV fluids.  Please take the Zofran as needed for nausea.  Please call your primary care doctor today to schedule a follow-up appointment in the coming week.  Please return to the emergency department if you develop any new or suddenly worsening symptoms.

## 2018-10-17 DIAGNOSIS — R110 Nausea: Secondary | ICD-10-CM | POA: Diagnosis not present

## 2018-10-17 DIAGNOSIS — I4821 Permanent atrial fibrillation: Secondary | ICD-10-CM | POA: Diagnosis not present

## 2018-10-17 DIAGNOSIS — M50121 Cervical disc disorder at C4-C5 level with radiculopathy: Secondary | ICD-10-CM | POA: Diagnosis not present

## 2018-10-17 DIAGNOSIS — Z7901 Long term (current) use of anticoagulants: Secondary | ICD-10-CM | POA: Diagnosis not present

## 2018-10-17 LAB — URINE CULTURE: Special Requests: NORMAL

## 2018-10-18 ENCOUNTER — Encounter (HOSPITAL_COMMUNITY): Payer: Self-pay

## 2018-10-18 ENCOUNTER — Emergency Department (HOSPITAL_COMMUNITY)
Admission: EM | Admit: 2018-10-18 | Discharge: 2018-10-18 | Disposition: A | Discharge status: Home / Self Care | Payer: Medicare Other | Attending: Emergency Medicine | Admitting: Emergency Medicine

## 2018-10-18 ENCOUNTER — Emergency Department (HOSPITAL_COMMUNITY): Payer: Medicare Other

## 2018-10-18 ENCOUNTER — Other Ambulatory Visit: Payer: Self-pay

## 2018-10-18 DIAGNOSIS — Z96641 Presence of right artificial hip joint: Secondary | ICD-10-CM | POA: Diagnosis not present

## 2018-10-18 DIAGNOSIS — Z85828 Personal history of other malignant neoplasm of skin: Secondary | ICD-10-CM | POA: Insufficient documentation

## 2018-10-18 DIAGNOSIS — Z950 Presence of cardiac pacemaker: Secondary | ICD-10-CM | POA: Insufficient documentation

## 2018-10-18 DIAGNOSIS — R112 Nausea with vomiting, unspecified: Secondary | ICD-10-CM | POA: Diagnosis not present

## 2018-10-18 DIAGNOSIS — K5900 Constipation, unspecified: Secondary | ICD-10-CM | POA: Insufficient documentation

## 2018-10-18 DIAGNOSIS — R5383 Other fatigue: Secondary | ICD-10-CM | POA: Insufficient documentation

## 2018-10-18 DIAGNOSIS — I4891 Unspecified atrial fibrillation: Secondary | ICD-10-CM | POA: Diagnosis not present

## 2018-10-18 DIAGNOSIS — Z79899 Other long term (current) drug therapy: Secondary | ICD-10-CM | POA: Diagnosis not present

## 2018-10-18 DIAGNOSIS — Z96651 Presence of right artificial knee joint: Secondary | ICD-10-CM | POA: Diagnosis not present

## 2018-10-18 DIAGNOSIS — R110 Nausea: Secondary | ICD-10-CM | POA: Diagnosis not present

## 2018-10-18 DIAGNOSIS — J9811 Atelectasis: Secondary | ICD-10-CM | POA: Diagnosis not present

## 2018-10-18 DIAGNOSIS — I10 Essential (primary) hypertension: Secondary | ICD-10-CM | POA: Insufficient documentation

## 2018-10-18 DIAGNOSIS — R0602 Shortness of breath: Secondary | ICD-10-CM | POA: Diagnosis not present

## 2018-10-18 DIAGNOSIS — Z96652 Presence of left artificial knee joint: Secondary | ICD-10-CM | POA: Insufficient documentation

## 2018-10-18 DIAGNOSIS — R1084 Generalized abdominal pain: Secondary | ICD-10-CM | POA: Diagnosis not present

## 2018-10-18 DIAGNOSIS — R52 Pain, unspecified: Secondary | ICD-10-CM | POA: Diagnosis not present

## 2018-10-18 DIAGNOSIS — Z87891 Personal history of nicotine dependence: Secondary | ICD-10-CM | POA: Insufficient documentation

## 2018-10-18 LAB — COMPREHENSIVE METABOLIC PANEL
ALT: 17 U/L (ref 0–44)
AST: 19 U/L (ref 15–41)
Albumin: 3.8 g/dL (ref 3.5–5.0)
Alkaline Phosphatase: 84 U/L (ref 38–126)
Anion gap: 10 (ref 5–15)
BUN: 14 mg/dL (ref 8–23)
CO2: 26 mmol/L (ref 22–32)
Calcium: 9.1 mg/dL (ref 8.9–10.3)
Chloride: 95 mmol/L — ABNORMAL LOW (ref 98–111)
Creatinine, Ser: 0.74 mg/dL (ref 0.44–1.00)
GFR calc Af Amer: >60 mL/min (ref >60)
GFR calc non Af Amer: >60 mL/min (ref >60)
Glucose, Bld: 102 mg/dL — ABNORMAL HIGH (ref 70–99)
Potassium: 4.1 mmol/L (ref 3.5–5.1)
Sodium: 131 mmol/L — ABNORMAL LOW (ref 135–145)
Total Bilirubin: 0.6 mg/dL (ref 0.3–1.2)
Total Protein: 6.8 g/dL (ref 6.5–8.1)

## 2018-10-18 LAB — CBC WITH DIFFERENTIAL/PLATELET
Abs Immature Granulocytes: 0.02 10*3/uL (ref 0.00–0.07)
Basophils Absolute: 0 10*3/uL (ref 0.0–0.1)
Basophils Relative: 0 %
Eosinophils Absolute: 0.1 10*3/uL (ref 0.0–0.5)
Eosinophils Relative: 1 %
HCT: 32.5 % — ABNORMAL LOW (ref 36.0–46.0)
Hemoglobin: 10 g/dL — ABNORMAL LOW (ref 12.0–15.0)
Immature Granulocytes: 0 %
Lymphocytes Relative: 12 %
Lymphs Abs: 0.9 10*3/uL (ref 0.7–4.0)
MCH: 26 pg (ref 26.0–34.0)
MCHC: 30.8 g/dL (ref 30.0–36.0)
MCV: 84.4 fL (ref 80.0–100.0)
Monocytes Absolute: 0.9 10*3/uL (ref 0.1–1.0)
Monocytes Relative: 12 %
Neutro Abs: 5.9 10*3/uL (ref 1.7–7.7)
Neutrophils Relative %: 75 %
Platelets: 330 10*3/uL (ref 150–400)
RBC: 3.85 MIL/uL — ABNORMAL LOW (ref 3.87–5.11)
RDW: 15.3 % (ref 11.5–15.5)
WBC: 7.8 10*3/uL (ref 4.0–10.5)
nRBC: 0 % (ref 0.0–0.2)

## 2018-10-18 LAB — URINALYSIS, ROUTINE W REFLEX MICROSCOPIC
Bilirubin Urine: NEGATIVE
Glucose, UA: NEGATIVE mg/dL
Hgb urine dipstick: NEGATIVE
Ketones, ur: NEGATIVE mg/dL
Leukocytes,Ua: NEGATIVE
Nitrite: NEGATIVE
Protein, ur: NEGATIVE mg/dL
Specific Gravity, Urine: 1.006 (ref 1.005–1.030)
pH: 7 (ref 5.0–8.0)

## 2018-10-18 LAB — TROPONIN I (HIGH SENSITIVITY)
Troponin I (High Sensitivity): 11 ng/L (ref <18)
Troponin I (High Sensitivity): 12 ng/L (ref <18)

## 2018-10-18 LAB — LIPASE, BLOOD: Lipase: 42 U/L (ref 11–51)

## 2018-10-18 MED ORDER — PROMETHAZINE HCL 25 MG/ML IJ SOLN
12.5000 mg | Freq: Once | INTRAMUSCULAR | Status: DC
  Filled 2018-10-18: qty 1

## 2018-10-18 MED ORDER — SODIUM CHLORIDE 0.9 % IV BOLUS
500.0000 mL | Freq: Once | INTRAVENOUS | Status: AC
  Administered 2018-10-18: 19:00:00 500 mL via INTRAVENOUS

## 2018-10-18 NOTE — ED Notes (Signed)
Pt stating she still does not feel that she needs the nausea medication at this time.

## 2018-10-18 NOTE — Discharge Instructions (Addendum)
Follow-up with GI for your recurrent nausea.  Return to the ED for new worsening symptoms.

## 2018-10-18 NOTE — ED Notes (Signed)
Pt given chicken noodle soup, crackers, and sprite.

## 2018-10-18 NOTE — ED Provider Notes (Signed)
[name] DEPT Provider Note   CSN: [id number] Arrival date & time: 10/18/18  1601  History   Chief Complaint Chief Complaint  Patient presents with   Nausea   Constipation   HPI [name] is a 85 y.o. female with past medical history significant for atrial fibrillation, bradycardia, GERD, hiatal hernia, pacemaker, chronic SOB with exertion, tractable nausea who presents for evaluation of nausea.  Patient states she has been chronically nauseous as well as generalized fatigue.  Was seen in emergency room 2 days ago for similar symptoms.  She is not followed by GI for this.  Patient states she feels as if she is constipated.  States she thinks it is been approximately 2 days since her last bowel movement.  She took MiraLAX 2 days ago one time without relief of her symptoms.  Patient states she did take 2 stool softeners this morning.  She is passing flatulence.  Has had 3 episodes of nonbloody, nonbilious emesis over the last 24 hours.  States she is able to tolerate p.o. intake however has persistent nausea.  She is unable to tell me if this feels similar to her previous episodes of nausea.  She does report some generalized abdominal fullness which she stated from her constipation.  Has had some intermittent shortness of breath however without chest pain.  Patient states she does live by herself however her son lives next door to her and monitors her with a video camera.  Denies any known COVID positive exposures.  She denies fever, chills, headache, neck pain, neck stiffness, congestion, rhinorrhea, cough, dysuria, melena or hematochezia.  States she was taking pain medicine for a pain in her neck prescribed by Dr. Dossie Der with [facility].  The last time she took her pain medicine was on Sunday.  Patient states she frequently does get constipated with nausea and emesis when she takes her home Norco.  She has not taken any since Sunday.  Denies additional  aggravating or alleviating factors.  History obtained from patient and past medical records.  No interpreter was used.     HPI  Past Medical History:  Diagnosis Date   Anxiety    Atrial fibrillation ([place])    Bradycardia    Cancer ([place])    skin   Cataract    removed bilat with lens both eyes    Complication of anesthesia    Difficulty sleeping    Dysrhythmia    A FIB   GERD (gastroesophageal reflux disease)    H/O hiatal hernia    History of kidney stones    History of skin cancer    History of transfusion    Hyperlipidemia    Hypertension    Osteoarthritis    Pacemaker    Pneumonia    PONV (postoperative nausea and vomiting)    Shortness of breath    WITH EXERTION    Patient Active Problem List   Diagnosis Date Noted   V tach ([place]) 08/09/2018   Intractable nausea and vomiting 08/09/2018   Hyponatremia 08/09/2018   Hypomagnesemia 08/09/2018   Hyperthyroidism 08/09/2018   Epigastric pain    Nonspecific chest pain 07/18/2018   Anxiety 07/18/2018   Insomnia 09/32/3557   Complication of anesthesia    Intractable headache 01/06/2017   Intractable pain 01/04/2017   Chronic anticoagulation 01/03/2017   Neck pain 01/03/2017   Acute bronchitis 06/08/2016   Fall 09/24/2015   Dizziness 09/24/2015   Postoperative anemia due to acute blood loss 12/27/2013  Postop Transfusion 12/27/2013   OA (osteoarthritis) of hip 12/24/2013   Preop cardiovascular exam 09/25/2013   Cardiac pacemaker in situ 08/29/2008   Essential hypertension 08/21/2008   ATRIAL FIBRILLATION 08/21/2008   Bradycardia 08/21/2008   PNEUMONIA 08/21/2008   GERD 08/21/2008   NEPHROLITHIASIS 08/21/2008   Osteoarthritis 08/21/2008    Past Surgical History:  Procedure Laterality Date   [place]   Left total knee   ARTHROPLASTY  2000   Total right knee   BACK SURGERY     CATARACT EXTRACTION, BILATERAL     with  lens implants   CHOLECYSTECTOMY     COLONOSCOPY     DG SELECTED HSG GDC ONLY  2002   Dilation   Microdiskectomy  05/2004   Left L4-L5   PACEMAKER INSERTION     POLYPECTOMY     TOTAL ABDOMINAL HYSTERECTOMY  1969   TOTAL HIP ARTHROPLASTY Right 12/24/2013   Procedure: RIGHT TOTAL HIP ARTHROPLASTY ANTERIOR APPROACH;  Surgeon: Gearlean Alf, MD;  Location: [facility];  Service: Orthopedics;  Laterality: Right;   UPPER GASTROINTESTINAL ENDOSCOPY       OB History   No obstetric history on file.      Home Medications    Prior to Admission medications   Medication Sig Start Date End Date Taking? Authorizing Provider  amLODipine (NORVASC) 10 MG tablet Take 10 mg by mouth every morning.    Yes [provider]  cephALEXin (KEFLEX) 250 MG capsule Take 250 mg by mouth 3 (three) times daily. For 7 days 10/12/18  Yes [provider]  famotidine (PEPCID) 20 MG tablet Take 1 tablet (20 mg total) by mouth 2 (two) times daily. Take every AM and at bedtime Patient taking differently: Take 20 mg by mouth 2 (two) times daily.  05/04/18  Yes Levin Erp, PA  feeding supplement, ENSURE ENLIVE, (ENSURE ENLIVE) LIQD Take 237 mLs by mouth 2 (two) times daily between meals. 08/11/18  Yes Mercy Riding, MD  irbesartan (AVAPRO) 300 MG tablet Take 300 mg by mouth every morning.   Yes [provider]  methimazole (TAPAZOLE) 5 MG tablet Take 1 tablet (5 mg total) by mouth daily. 07/20/18  Yes Geradine Girt, DO  omeprazole (PRILOSEC) 20 MG capsule Take 20 mg by mouth daily. 30-60 minutes before dinner.   Yes [provider]  potassium chloride SA (K-DUR) 20 MEQ tablet Take 20 mEq by mouth 2 (two) times daily. 04/29/18  Yes [provider]  pravastatin (PRAVACHOL) 40 MG tablet Take 40 mg by mouth every morning.    Yes [provider]  warfarin (COUMADIN) 2 MG tablet Take 1-2 tablets (2-4 mg total) by mouth See admin instructions. Take 4 mg by mouth on  MWF and 2 mg all other days Patient taking differently: Take 2-4 mg by mouth See admin instructions. Take 4 mg by mouth on MWF and 2 mg all other days. 07/19/18  Yes Geradine Girt, DO  acetaminophen (TYLENOL) 325 MG tablet Take 650 mg by mouth every 6 (six) hours as needed for mild pain.     [provider]  clonazePAM (KLONOPIN) 0.5 MG tablet Take 0.5 mg by mouth at bedtime as needed for anxiety.  03/18/15   [provider]  docusate sodium (COLACE) 100 MG capsule Take 100 mg by mouth daily as needed for mild constipation.    [provider]  HYDROcodone-acetaminophen (NORCO) 5-325 MG tablet Take 1 tablet by  mouth 2 (two) times daily as needed for pain.    [provider]  Lidocaine (HM LIDOCAINE PATCH) 4 % PTCH Apply 1 patch topically daily. Patient not taking: Reported on 10/16/2018 08/12/18   Fatima Blank, MD  ondansetron (ZOFRAN ODT) 4 MG disintegrating tablet Take 1 tablet (4 mg total) by mouth every 8 (eight) hours as needed for nausea or vomiting. 10/16/18   Long, Wonda Olds, MD  ondansetron (ZOFRAN) 4 MG tablet Take 1 tablet (4 mg total) by mouth every 4 (four) hours as needed for nausea or vomiting. Patient taking differently: Take 2 mg by mouth every 4 (four) hours as needed for nausea or vomiting.  04/15/17   Levin Erp, PA    Family History Family History  Problem Relation Age of Onset   Heart disease Mother    Leukemia Father    Colon cancer Neg Hx    Esophageal cancer Neg Hx    Stomach cancer Neg Hx    Pancreatic cancer Neg Hx    Liver disease Neg Hx    Colon polyps Neg Hx     Social History Social History   Tobacco Use   Smoking status: Former Smoker    Quit date: 12/18/1973    Years since quitting: 44.8   Smokeless tobacco: Never Used   Tobacco comment: Smoker for 25 years and quit at around age 28  Substance Use Topics   Alcohol use: No   Drug use: No     Allergies   Morphine and  related   Review of Systems Review of Systems  Constitutional: Positive for fatigue. Negative for activity change, appetite change, chills, diaphoresis, fever and unexpected weight change.  HENT: Negative.   Eyes: Negative.   Respiratory: Positive for shortness of breath (intermittent). Negative for apnea, cough, choking, chest tightness, wheezing and stridor.   Cardiovascular: Negative.   Gastrointestinal: Positive for abdominal pain, constipation, nausea and vomiting. Negative for anal bleeding, blood in stool, diarrhea and rectal pain.  Genitourinary: Negative.   Musculoskeletal: Negative.   Skin: Negative.   Neurological: Negative.   All other systems reviewed and are negative.    Physical Exam Updated Vital Signs BP (!) 180/87    Pulse 62    Temp 98.4 F (36.9 C) (Oral)    Resp 17    SpO2 98%   Physical Exam Vitals signs and nursing note reviewed. Exam conducted with a chaperone present.  Constitutional:      General: She is not in acute distress.    Appearance: She is not ill-appearing, toxic-appearing or diaphoretic.  HENT:     Head: Normocephalic and atraumatic.     Nose:     Comments: No rhinorrhea and congestion to bilateral nares.  No sinus tenderness.    Mouth/Throat:     Comments: Posterior oropharynx clear.  Mucous membranes moist.  Tonsils without erythema or exudate.  Uvula midline without deviation.  No drooling, dysphasia or trismus.  Phonation normal. Neck:     Trachea: Trachea and phonation normal.     Comments: No Neck stiffness or neck rigidity. No cervical lymphadenopathy. Cardiovascular:     Comments: No murmurs rubs or gallops. Pulmonary:     Comments: Clear to auscultation bilaterally without wheeze, rhonchi or rales.  No accessory muscle usage.  Able speak in full sentences. Abdominal:     Comments: Soft, nontender without rebound or guarding.  No CVA tenderness.  Genitourinary:    Comments: Minimal light brown stool in rectal vault.  Old  hemorrhoid skin tag at 9 o'clock position.  No anal fissures.  No surrounding erythema. Musculoskeletal:     Comments: Moves all 4 extremities without difficulty.  Lower extremities without edema, erythema or warmth.  Skin:    Comments: Brisk capillary refill.  No rashes or lesions.  Neurological:     Mental Status: She is alert.     Comments: Ambulatory in department without difficulty.  Cranial nerves II through XII grossly intact.  No facial droop.  No aphasia.    ED Treatments / Results  Labs (all labs ordered are listed, but only abnormal results are displayed) Labs Reviewed  CBC WITH DIFFERENTIAL/PLATELET - Abnormal; Notable for the following components:      Result Value   RBC 3.85 (*)    Hemoglobin 10.0 (*)    HCT 32.5 (*)    All other components within normal limits  COMPREHENSIVE METABOLIC PANEL - Abnormal; Notable for the following components:   Sodium 131 (*)    Chloride 95 (*)    Glucose, Bld 102 (*)    All other components within normal limits  URINALYSIS, ROUTINE W REFLEX MICROSCOPIC - Abnormal; Notable for the following components:   Color, Urine STRAW (*)    All other components within normal limits  URINE CULTURE  LIPASE, BLOOD  TROPONIN I (HIGH SENSITIVITY)  TROPONIN I (HIGH SENSITIVITY)    EKG EKG Interpretation  Date/Time:  Wednesday October 18 2018 19:17:51 EDT Ventricular Rate:  62 PR Interval:    QRS Duration: 86 QT Interval:  372 QTC Calculation: 387 R Axis:   86 Text Interpretation:  Afib/flut and V-paced complexes No further rhythm analysis attempted due to paced rhythm Borderline right axis deviation Nonspecific T abnrm, anterolateral leads Confirmed by Sherwood Gambler [phone]) on 10/18/2018 8:16:55 PM   Radiology Dg Chest 2 View  Result Date: 10/18/2018 CLINICAL DATA:  Nausea for several days EXAM: CHEST - 2 VIEW COMPARISON:  10/16/2018 FINDINGS: Cardiac shadow is stable. Pacing device is again seen. Lungs are well aerated bilaterally. Minimal  bibasilar atelectasis is noted new from the prior exam. No focal confluent infiltrate is seen. No sizable effusion is noted. No bony abnormality is noted. IMPRESSION: Minimal bibasilar atelectasis. Electronically Signed   By: Inez Catalina M.D.   On: 10/18/2018 19:28   Dg Abdomen 1 View  Result Date: 10/18/2018 CLINICAL DATA:  Constipation EXAM: ABDOMEN - 1 VIEW COMPARISON:  06/14/2016 FINDINGS: Scattered large and small bowel gas is noted. No significant retained fecal material is noted. No obstructive changes are seen. Postoperative changes are noted. Degenerative change of the lumbar spine is noted. IMPRESSION: No acute abnormality noted.  No constipation is seen. Electronically Signed   By: Inez Catalina M.D.   On: 10/18/2018 19:35    Procedures Procedures (including critical care time)  Medications Ordered in ED Medications  promethazine (PHENERGAN) injection 12.5 mg (0 mg Intravenous Hold 10/18/18 1926)  sodium chloride 0.9 % bolus 500 mL (500 mLs Intravenous New Bag/Given 10/18/18 1926)   Initial Impression / Assessment and Plan / ED Course  I have reviewed the triage vital signs and the nursing notes.  Pertinent labs & imaging results that were available during my care of the patient were reviewed by me and considered in my medical decision making (see chart for details).  85 year old female appears otherwise well presents for evaluation of nausea, fatigue and constipation.  Seen in emergency department 2 days ago for similar symptoms.  Patient does have history of chronic  nausea and intractable vomiting.  She has been admitted for this in the past.  3 episodes of nonbloody, nonbilious emesis.  No known food exposures.  No cough, hemoptysis however states she has had intermittent shortness of breath which appears to be at her baseline.  Abdomen soft with generalized tenderness throughout.  No rebound or guarding.  No focal tenderness.  No overlying skin changes to abdominal wall.  Heart and  lungs clear.  DDX broad.  Will repeat labs, imaging, provide IV fluids, antiemetics and reevaluate.  Labs and imaging personally reviewed: CBC without leukocytosis, hemoglobin 10.0, at baseline Metabolic panel with mild hyponatremia at 131, given IV fluids, mild hyperglycemia at 102, no additional electrolyte, renal or liver abnormality,  Delta troponin negative lipase 42 urinalysis negative for infection EKG without ST/T changes. No STEMI Plain film chest with minimal bibasilar atelectasis, no infiltrates, cardiomegaly, pulmonary edema, pneumothorax Plain film abdomen without constipation, air-fluid level. No obstructive changes.  2030: GU exam with light brown stool in rectal vault.  No impaction.  On reevaluation abdomen soft, nontender without rebound or guarding. No surgical abdomen. Patient refused nausea medicine initially. Feels improvement with IVF. Will PO trial and reevaluate. Feel some degree of acute on chronic constipation. Given COVID test 2 days ago do no feel need repeat testing at this time.  Nonfocal neurologic exam without neurologic deficits.  I have low suspicion for ACS, PE, dissection, CVA as cause of her generalized fatigue.  2050: Patient tolerated Sprite, crackers and chicken noodle soup without difficulty.  Evaluation abdomen soft, nontender without rebound or guarding.  Do not think we need to repeat CT imaging at this time.  We will have her follow-up outpatient with GI for her chronic nausea. Discussed bowel regime at home for constpation. Ambulated to bathroom without difficulty. Does not appear septic or ill.  The patient has been appropriately medically screened and/or stabilized in the ED. I have low suspicion for any other emergent medical condition which would require further screening, evaluation or treatment in the ED or require inpatient management.  Patient is hemodynamically stable and in no acute distress.  Patient able to ambulate in department prior to  ED.  Evaluation does not show acute pathology that would require ongoing or additional emergent interventions while in the emergency department or further inpatient treatment.  I have discussed the diagnosis with the patient and answered all questions.  Pain is been managed while in the emergency department and patient has no further complaints prior to discharge.  Patient is comfortable with plan discussed in room and is stable for discharge at this time.  I have discussed strict return precautions for returning to the emergency department.  Patient was encouraged to follow-up with PCP/specialist refer to at discharge.    Patient has been seen and evaluated by my attending Dr. Morton Amy who agrees with above treatment, plan and disposition.   Final Clinical Impressions(s) / ED Diagnoses   Final diagnoses:  Nausea  Fatigue, unspecified type    ED Discharge Orders    None       Willisha Sligar A, PA-C 10/18/18 2148    Sherwood Gambler, MD 10/19/18 1232

## 2018-10-18 NOTE — ED Triage Notes (Addendum)
EMS reports from home, Nausea x 4 months, worsening over last 4 days, also c/o constipation without BM x several days. Seen yesterday for same, and Covid tested Negative. Pt states has Pacemaker.  BP 176/100 HR 68 RR 20 Sp02 100 RA CBG 129 Temp 98.4

## 2018-10-20 LAB — URINE CULTURE: Culture: 60000 — AB

## 2018-10-23 DIAGNOSIS — K5903 Drug induced constipation: Secondary | ICD-10-CM | POA: Diagnosis not present

## 2018-10-23 DIAGNOSIS — M47812 Spondylosis without myelopathy or radiculopathy, cervical region: Secondary | ICD-10-CM | POA: Diagnosis not present

## 2018-10-25 ENCOUNTER — Other Ambulatory Visit: Payer: Self-pay | Admitting: Chiropractic Medicine

## 2018-10-25 DIAGNOSIS — M47812 Spondylosis without myelopathy or radiculopathy, cervical region: Secondary | ICD-10-CM

## 2018-10-26 ENCOUNTER — Ambulatory Visit
Admission: RE | Admit: 2018-10-26 | Discharge: 2018-10-26 | Disposition: A | Discharge status: Home / Self Care | Payer: Medicare Other | Source: Ambulatory Visit | Attending: Chiropractic Medicine | Admitting: Chiropractic Medicine

## 2018-10-26 DIAGNOSIS — M47812 Spondylosis without myelopathy or radiculopathy, cervical region: Secondary | ICD-10-CM

## 2018-10-26 DIAGNOSIS — M4312 Spondylolisthesis, cervical region: Secondary | ICD-10-CM | POA: Diagnosis not present

## 2018-10-26 DIAGNOSIS — M4802 Spinal stenosis, cervical region: Secondary | ICD-10-CM | POA: Diagnosis not present

## 2018-11-07 DIAGNOSIS — Z7901 Long term (current) use of anticoagulants: Secondary | ICD-10-CM | POA: Diagnosis not present

## 2018-11-07 DIAGNOSIS — I4821 Permanent atrial fibrillation: Secondary | ICD-10-CM | POA: Diagnosis not present

## 2018-11-09 DIAGNOSIS — I4821 Permanent atrial fibrillation: Secondary | ICD-10-CM | POA: Diagnosis not present

## 2018-11-09 DIAGNOSIS — M545 Low back pain: Secondary | ICD-10-CM | POA: Diagnosis not present

## 2018-11-09 DIAGNOSIS — Z7901 Long term (current) use of anticoagulants: Secondary | ICD-10-CM | POA: Diagnosis not present

## 2018-11-20 ENCOUNTER — Ambulatory Visit: Payer: Medicare Other | Admitting: Internal Medicine

## 2018-11-22 DIAGNOSIS — Z7902 Long term (current) use of antithrombotics/antiplatelets: Secondary | ICD-10-CM | POA: Diagnosis not present

## 2018-11-22 DIAGNOSIS — R110 Nausea: Secondary | ICD-10-CM | POA: Diagnosis not present

## 2018-11-22 DIAGNOSIS — I4821 Permanent atrial fibrillation: Secondary | ICD-10-CM | POA: Diagnosis not present

## 2018-11-22 DIAGNOSIS — Z7901 Long term (current) use of anticoagulants: Secondary | ICD-10-CM | POA: Diagnosis not present

## 2018-11-22 DIAGNOSIS — M50121 Cervical disc disorder at C4-C5 level with radiculopathy: Secondary | ICD-10-CM | POA: Diagnosis not present

## 2018-11-23 DIAGNOSIS — M5412 Radiculopathy, cervical region: Secondary | ICD-10-CM | POA: Diagnosis not present

## 2018-11-28 DIAGNOSIS — I4821 Permanent atrial fibrillation: Secondary | ICD-10-CM | POA: Diagnosis not present

## 2018-11-28 DIAGNOSIS — Z7902 Long term (current) use of antithrombotics/antiplatelets: Secondary | ICD-10-CM | POA: Diagnosis not present

## 2018-11-28 DIAGNOSIS — M50121 Cervical disc disorder at C4-C5 level with radiculopathy: Secondary | ICD-10-CM | POA: Diagnosis not present

## 2018-11-28 DIAGNOSIS — Z7901 Long term (current) use of anticoagulants: Secondary | ICD-10-CM | POA: Diagnosis not present

## 2018-11-28 DIAGNOSIS — Z23 Encounter for immunization: Secondary | ICD-10-CM | POA: Diagnosis not present

## 2018-12-07 DIAGNOSIS — Z7901 Long term (current) use of anticoagulants: Secondary | ICD-10-CM | POA: Diagnosis not present

## 2018-12-07 DIAGNOSIS — I4821 Permanent atrial fibrillation: Secondary | ICD-10-CM | POA: Diagnosis not present

## 2018-12-07 DIAGNOSIS — M50121 Cervical disc disorder at C4-C5 level with radiculopathy: Secondary | ICD-10-CM | POA: Diagnosis not present

## 2018-12-19 ENCOUNTER — Ambulatory Visit: Payer: Medicare Other | Admitting: Internal Medicine

## 2018-12-26 ENCOUNTER — Other Ambulatory Visit: Payer: Self-pay | Admitting: General Surgery

## 2018-12-26 MED ORDER — FAMOTIDINE 20 MG PO TABS: 20.0000 mg | ORAL_TABLET | Freq: Two times a day (BID) | ORAL | 3 refills | Status: DC

## 2019-01-02 DIAGNOSIS — R001 Bradycardia, unspecified: Secondary | ICD-10-CM | POA: Diagnosis not present

## 2019-01-11 ENCOUNTER — Other Ambulatory Visit: Payer: Self-pay | Admitting: Internal Medicine

## 2019-01-11 DIAGNOSIS — I4821 Permanent atrial fibrillation: Secondary | ICD-10-CM | POA: Diagnosis not present

## 2019-01-11 DIAGNOSIS — M50121 Cervical disc disorder at C4-C5 level with radiculopathy: Secondary | ICD-10-CM | POA: Diagnosis not present

## 2019-01-11 DIAGNOSIS — Z7901 Long term (current) use of anticoagulants: Secondary | ICD-10-CM | POA: Diagnosis not present

## 2019-01-11 DIAGNOSIS — M47812 Spondylosis without myelopathy or radiculopathy, cervical region: Secondary | ICD-10-CM | POA: Diagnosis not present

## 2019-01-16 DIAGNOSIS — Z7901 Long term (current) use of anticoagulants: Secondary | ICD-10-CM | POA: Diagnosis not present

## 2019-01-16 DIAGNOSIS — Z7902 Long term (current) use of antithrombotics/antiplatelets: Secondary | ICD-10-CM | POA: Diagnosis not present

## 2019-01-16 DIAGNOSIS — I4821 Permanent atrial fibrillation: Secondary | ICD-10-CM | POA: Diagnosis not present

## 2019-01-16 DIAGNOSIS — M50121 Cervical disc disorder at C4-C5 level with radiculopathy: Secondary | ICD-10-CM | POA: Diagnosis not present

## 2019-01-26 ENCOUNTER — Other Ambulatory Visit: Payer: Self-pay

## 2019-01-26 ENCOUNTER — Ambulatory Visit (INDEPENDENT_AMBULATORY_CARE_PROVIDER_SITE_OTHER): Payer: Medicare Other | Admitting: Internal Medicine

## 2019-01-26 ENCOUNTER — Telehealth: Payer: Self-pay | Admitting: Internal Medicine

## 2019-01-26 ENCOUNTER — Encounter: Payer: Self-pay | Admitting: Internal Medicine

## 2019-01-26 VITALS — BP 122/68 | HR 60 | Ht 63.0 in | Wt 154.8 lb

## 2019-01-26 DIAGNOSIS — R001 Bradycardia, unspecified: Secondary | ICD-10-CM

## 2019-01-26 DIAGNOSIS — Z950 Presence of cardiac pacemaker: Secondary | ICD-10-CM

## 2019-01-26 DIAGNOSIS — I4821 Permanent atrial fibrillation: Secondary | ICD-10-CM

## 2019-01-26 DIAGNOSIS — I10 Essential (primary) hypertension: Secondary | ICD-10-CM | POA: Diagnosis not present

## 2019-01-26 MED ORDER — FUROSEMIDE 20 MG PO TABS: 20.0000 mg | ORAL_TABLET | Freq: Every day | ORAL | 3 refills | Status: DC

## 2019-01-26 NOTE — Progress Notes (Signed)
HPI [name] returns today for followup of her PPM. She is a pleasant 85 yo woman with chronic atrial fib, CHB, s/p PPM insertion, chronic diastolic heart failure and venous insufficiency. The patient has had trouble with dyspnea with exertion. She also notes some peripheral edema. She had been taking some HCTZ. No anginal symptoms. No syncope.  Allergies  Allergen Reactions  . Morphine And Related Nausea And Vomiting     Current Outpatient Medications  Medication Sig Dispense Refill  . acetaminophen (TYLENOL) 325 MG tablet Take 650 mg by mouth every 6 (six) hours as needed for mild pain.     Marland Kitchen amLODipine (NORVASC) 10 MG tablet Take 10 mg by mouth every morning.     . clonazePAM (KLONOPIN) 0.5 MG tablet Take 0.5 mg by mouth at bedtime as needed for anxiety.     . docusate sodium (COLACE) 100 MG capsule Take 100 mg by mouth daily as needed for mild constipation.    . famotidine (PEPCID) 20 MG tablet Take 1 tablet (20 mg total) by mouth 2 (two) times daily. Take every AM and at bedtime 180 tablet 3  . feeding supplement, ENSURE ENLIVE, (ENSURE ENLIVE) LIQD Take 237 mLs by mouth 2 (two) times daily between meals. 60 Bottle 0  . HYDROcodone-acetaminophen (NORCO) 5-325 MG tablet Take 1 tablet by mouth 2 (two) times daily as needed for pain.    Marland Kitchen irbesartan (AVAPRO) 300 MG tablet Take 300 mg by mouth every morning.    . methimazole (TAPAZOLE) 5 MG tablet Take 1 tablet (5 mg total) by mouth daily.    Marland Kitchen omeprazole (PRILOSEC) 20 MG capsule Take 20 mg by mouth daily. 30-60 minutes before dinner.    . ondansetron (ZOFRAN ODT) 4 MG disintegrating tablet Take 1 tablet (4 mg total) by mouth every 8 (eight) hours as needed for nausea or vomiting. 20 tablet 0  . ondansetron (ZOFRAN) 4 MG tablet Take 1 tablet (4 mg total) by mouth every 4 (four) hours as needed for nausea or vomiting. (Patient taking differently: Take 2 mg by mouth every 4 (four) hours as needed for nausea or vomiting. ) 20 tablet 1   . potassium chloride SA (K-DUR) 20 MEQ tablet Take 20 mEq by mouth 2 (two) times daily.    . pravastatin (PRAVACHOL) 40 MG tablet Take 40 mg by mouth every morning.     . warfarin (COUMADIN) 2 MG tablet Take 1-2 tablets (2-4 mg total) by mouth See admin instructions. Take 4 mg by mouth on MWF and 2 mg all other days     No current facility-administered medications for this visit.      Past Medical History:  Diagnosis Date  . Anxiety   . Atrial fibrillation ([place])   . Bradycardia   . Cancer ([place])    skin  . Cataract    removed bilat with lens both eyes   . Complication of anesthesia   . Difficulty sleeping   . Dysrhythmia    A FIB  . GERD (gastroesophageal reflux disease)   . H/O hiatal hernia   . History of kidney stones   . History of skin cancer   . History of transfusion   . Hyperlipidemia   . Hypertension   . Osteoarthritis   . Pacemaker   . Pneumonia   . PONV (postoperative nausea and vomiting)   . Shortness of breath    WITH EXERTION    ROS:   All systems reviewed and negative  except as noted in the HPI.   Past Surgical History:  Procedure Laterality Date  . APPENDECTOMY  1960  . ARTHROPLASTY  1994   Left total knee  . ARTHROPLASTY  2000   Total right knee  . BACK SURGERY    . CATARACT EXTRACTION, BILATERAL     with lens implants  . CHOLECYSTECTOMY    . COLONOSCOPY    . DG SELECTED HSG GDC ONLY  2002   Dilation  . Microdiskectomy  05/2004   Left L4-L5  . PACEMAKER INSERTION    . POLYPECTOMY    . TOTAL ABDOMINAL HYSTERECTOMY  1969  . TOTAL HIP ARTHROPLASTY Right 12/24/2013   Procedure: RIGHT TOTAL HIP ARTHROPLASTY ANTERIOR APPROACH;  Surgeon: Gearlean Alf, MD;  Location: [facility];  Service: Orthopedics;  Laterality: Right;  . UPPER GASTROINTESTINAL ENDOSCOPY       Family History  Problem Relation Age of Onset  . Heart disease Mother   . Leukemia Father   . Colon cancer Neg Hx   . Esophageal cancer Neg Hx   . Stomach cancer Neg Hx   .  Pancreatic cancer Neg Hx   . Liver disease Neg Hx   . Colon polyps Neg Hx      Social History   Socioeconomic History  . Marital status: Widowed    Spouse name: Not on file  . Number of children: 2  . Years of education: Not on file  . Highest education level: Not on file  Occupational History  . Occupation: retired  Scientific laboratory technician  . Financial resource strain: Not on file  . Food insecurity    Worry: Not on file    Inability: Not on file  . Transportation needs    Medical: Not on file    Non-medical: Not on file  Tobacco Use  . Smoking status: Former Smoker    Quit date: 12/18/1973    Years since quitting: 45.1  . Smokeless tobacco: Never Used  . Tobacco comment: Smoker for 25 years and quit at around age 72  Substance and Sexual Activity  . Alcohol use: No  . Drug use: No  . Sexual activity: Not Currently  Lifestyle  . Physical activity    Days per week: Not on file    Minutes per session: Not on file  . Stress: Not on file  Relationships  . Social Herbalist on phone: Not on file    Gets together: Not on file    Attends religious service: Not on file    Active member of club or organization: Not on file    Attends meetings of clubs or organizations: Not on file    Relationship status: Not on file  . Intimate partner violence    Fear of current or ex partner: Not on file    Emotionally abused: Not on file    Physically abused: Not on file    Forced sexual activity: Not on file  Other Topics Concern  . Not on file  Social History Narrative   Married, husband's name is Warner Mccreedy, they have been married since 7   2 sons and 4 grandchildren           BP 122/68   Pulse 60   Ht 5\' 3"  (1.6 m)   Wt 154 lb 12.8 oz (70.2 kg)   SpO2 98%   BMI 27.42 kg/m   Physical Exam:  Well appearing NAD HEENT: Unremarkable Neck:  No JVD, no thyromegally  Lymphatics:  No adenopathy Back:  No CVA tenderness Lungs:  Clear with no wheezes HEART:  Regular rate  rhythm, no murmurs, no rubs, no clicks Abd:  soft, positive bowel sounds, no organomegally, no rebound, no guarding Ext:  2 plus pulses, no edema, no cyanosis, no clubbing Skin:  No rashes no nodules Neuro:  CN II through XII intact, motor grossly intact   DEVICE  Normal device function.  See PaceArt for details.   Assess/Plan: 1. Chronic atrial fib - her VR is well controlled.  2. Chronic diastolic heart failure - she has dyspnea which is class 3. I have asked her to take lasix 40 mg daily.  3. PPM - her St. Jude single chamber PPM is working normally. She is mostly ventricular pacing.   Mikle Bosworth.D.

## 2019-01-26 NOTE — Telephone Encounter (Signed)
Call placed to granddaughter.  DC HCTZ

## 2019-01-26 NOTE — Patient Instructions (Addendum)
Medication Instructions:  Your physician has recommended you make the following change in your medication:   1.  Start taking lasix 20 mg-  Take one tablet by mouth daily in the morning.  Labwork: None ordered.  Testing/Procedures: You will need to come to the [facility] office in 2 weeks for a:  BMP and CBC  February 12, 2019 any time between 8:00 am and 4:00 pm.  You do NOT need to be fasting.  Follow-Up:  Your physician wants you to follow-up in: 6 months with device clinic for a device check.  You will receive a reminder letter in the mail two months in advance. If you don't receive a letter, please call our office to schedule the follow-up appointment.  Your physician wants you to follow-up in: one year with Dr. Lovena Le.   You will receive a reminder letter in the mail two months in advance. If you don't receive a letter, please call our office to schedule the follow-up appointment.   Any Other Special Instructions Will Be Listed Below (If Applicable).  If you need a refill on your cardiac medications before your next appointment, please call your pharmacy.

## 2019-01-26 NOTE — Telephone Encounter (Signed)
° ° °  Granddaughter calling to clarify medication instructions for Lasix

## 2019-01-30 DIAGNOSIS — N390 Urinary tract infection, site not specified: Secondary | ICD-10-CM | POA: Diagnosis not present

## 2019-02-01 ENCOUNTER — Other Ambulatory Visit: Payer: Self-pay | Admitting: Internal Medicine

## 2019-02-02 ENCOUNTER — Other Ambulatory Visit: Payer: Self-pay

## 2019-02-02 NOTE — Telephone Encounter (Signed)
This encounter was created in error - please disregard.

## 2019-02-02 NOTE — Addendum Note (Signed)
Addended by: Jiles Harold on: 02/02/2019 04:46 PM   Modules accepted: Level of Service, SmartSet

## 2019-02-05 ENCOUNTER — Other Ambulatory Visit: Payer: Self-pay | Admitting: Physician Assistant

## 2019-02-12 ENCOUNTER — Other Ambulatory Visit: Payer: Medicare Other | Admitting: *Deleted

## 2019-02-12 ENCOUNTER — Other Ambulatory Visit: Payer: Self-pay

## 2019-02-12 DIAGNOSIS — I10 Essential (primary) hypertension: Secondary | ICD-10-CM | POA: Diagnosis not present

## 2019-02-12 DIAGNOSIS — Z7901 Long term (current) use of anticoagulants: Secondary | ICD-10-CM | POA: Diagnosis not present

## 2019-02-12 DIAGNOSIS — Z950 Presence of cardiac pacemaker: Secondary | ICD-10-CM

## 2019-02-12 DIAGNOSIS — I4821 Permanent atrial fibrillation: Secondary | ICD-10-CM | POA: Diagnosis not present

## 2019-02-12 DIAGNOSIS — R001 Bradycardia, unspecified: Secondary | ICD-10-CM | POA: Diagnosis not present

## 2019-02-12 LAB — CBC WITH DIFFERENTIAL/PLATELET
Basophils Absolute: 0 10*3/uL (ref 0.0–0.2)
Basos: 1 %
EOS (ABSOLUTE): 0.1 10*3/uL (ref 0.0–0.4)
Eos: 1 %
Hematocrit: 32.4 % — ABNORMAL LOW (ref 34.0–46.6)
Hemoglobin: 10 g/dL — ABNORMAL LOW (ref 11.1–15.9)
Immature Grans (Abs): 0 10*3/uL (ref 0.0–0.1)
Immature Granulocytes: 0 %
Lymphocytes Absolute: 1.1 10*3/uL (ref 0.7–3.1)
Lymphs: 14 %
MCH: 24 pg — ABNORMAL LOW (ref 26.6–33.0)
MCHC: 30.9 g/dL — ABNORMAL LOW (ref 31.5–35.7)
MCV: 78 fL — ABNORMAL LOW (ref 79–97)
Monocytes Absolute: 0.8 10*3/uL (ref 0.1–0.9)
Monocytes: 10 %
Neutrophils Absolute: 5.7 10*3/uL (ref 1.4–7.0)
Neutrophils: 74 %
Platelets: 384 10*3/uL (ref 150–450)
RBC: 4.16 x10E6/uL (ref 3.77–5.28)
RDW: 15.8 % — ABNORMAL HIGH (ref 11.7–15.4)
WBC: 7.7 10*3/uL (ref 3.4–10.8)

## 2019-02-12 LAB — BASIC METABOLIC PANEL
BUN/Creatinine Ratio: 16 (ref 12–28)
BUN: 15 mg/dL (ref 10–36)
CO2: 20 mmol/L (ref 20–29)
Calcium: 9.4 mg/dL (ref 8.7–10.3)
Chloride: 95 mmol/L — ABNORMAL LOW (ref 96–106)
Creatinine, Ser: 0.93 mg/dL (ref 0.57–1.00)
GFR calc Af Amer: 62 mL/min/{1.73_m2} (ref >59)
GFR calc non Af Amer: 54 mL/min/{1.73_m2} — ABNORMAL LOW (ref >59)
Glucose: 81 mg/dL (ref 65–99)
Potassium: 4.3 mmol/L (ref 3.5–5.2)
Sodium: 132 mmol/L — ABNORMAL LOW (ref 134–144)

## 2019-02-19 ENCOUNTER — Other Ambulatory Visit: Payer: Self-pay | Admitting: Physician Assistant

## 2019-02-19 NOTE — Telephone Encounter (Signed)
Error

## 2019-03-05 ENCOUNTER — Other Ambulatory Visit: Payer: Self-pay | Admitting: *Deleted

## 2019-03-05 NOTE — Patient Outreach (Addendum)
[place] [facility]) Care Management  [place]  03/05/2019   KNOELLE WOLLER [birth year] [id number]  RN Health Coach telephone call to patient.  Hipaa compliance verified. Per patient she is doing good.Patient stated that she does get tired easily. Per patient she checks her blood pressure occasionally. She lives alone with son next door and have good support from family. She receives mobile meals and family brings meals. Per patient she does not put extra salt on food only eating the way it is cooked.  Family does all laundry and housekeeping. Kennedy daughter takes her to her appointments. Family runs all errands for patient. Per patient they do not allow her to go out doing pandemic. She does not have any lower extremity edema today. When she does per patient she elevates them and takes her medications as prescribed. Patient stated she had a recent fall but no injury. / Patient has significant neck and back pain.  Patient has agreed to follow up outreach calls.    Encounter Medications:  Outpatient Encounter Medications as of 03/05/2019  Medication Sig  . acetaminophen (TYLENOL) 325 MG tablet Take 650 mg by mouth every 6 (six) hours as needed for mild pain.   Marland Kitchen amLODipine (NORVASC) 10 MG tablet Take 10 mg by mouth every morning.   . clonazePAM (KLONOPIN) 0.5 MG tablet Take 0.5 mg by mouth at bedtime as needed for anxiety.   . docusate sodium (COLACE) 100 MG capsule Take 100 mg by mouth daily as needed for mild constipation.  . enoxaparin (LOVENOX) 80 MG/0.8ML injection as needed.  . famotidine (PEPCID) 20 MG tablet Take 1 tablet (20 mg total) by mouth 2 (two) times daily. Take every AM and at bedtime  . feeding supplement, ENSURE ENLIVE, (ENSURE ENLIVE) LIQD Take 237 mLs by mouth 2 (two) times daily between meals.  . furosemide (LASIX) 20 MG tablet Take 1 tablet (20 mg total) by mouth daily.  Marland Kitchen HYDROcodone-acetaminophen (NORCO) 5-325 MG tablet Take 0.5 tablets by mouth as  needed.   . irbesartan (AVAPRO) 300 MG tablet Take 300 mg by mouth every morning.  Marland Kitchen omeprazole (PRILOSEC) 20 MG capsule TAKE 1 CAPSULE (20 MG TOTAL) BY MOUTH DAILY. TAKE 30-60 MIN BEFORE DINNER  . ondansetron (ZOFRAN) 4 MG tablet Take 2 mg by mouth as needed for nausea or vomiting.  . potassium chloride SA (K-DUR) 20 MEQ tablet Take 20 mEq by mouth 2 (two) times daily.  . pravastatin (PRAVACHOL) 40 MG tablet Take 40 mg by mouth every morning.   . warfarin (COUMADIN) 2 MG tablet Take 1-2 tablets (2-4 mg total) by mouth See admin instructions. Take 4 mg by mouth on MWF and 2 mg all other days   No facility-administered encounter medications on file as of 03/05/2019.     Functional Status:  In your present state of health, do you have any difficulty performing the following activities: 03/05/2019 08/09/2018  Hearing? Y N  Comment bilateral hearing aides -  Vision? N N  Comment 20/20 vision with lens implant./ sometimes will wear low strength reading glasses -  Difficulty concentrating or making decisions? N N  Walking or climbing stairs? Y N  Comment abulates with a cane. has back and neck discomfort -  Dressing or bathing? N N  Doing errands, shopping? Y N  Comment sons and grand daughter and grandson care for patient -  Conservation officer, nature and eating ? Y -  Comment meals on wheels and family fixes meals -  Using  the Toilet? N -  In the past six months, have you accidently leaked urine? N -  Do you have problems with loss of bowel control? N -  Managing your Medications? Y -  Comment Daughter prepares medications -  Managing your Finances? N -  Housekeeping or managing your Housekeeping? Y -  Comment Family cleans home and changes bed -  Some recent data might be hidden    Fall/Depression Screening: Fall Risk  03/05/2019  Falls in the past year? 1  Number falls in past yr: 0  Injury with Fall? 0  Risk for fall due to : History of fall(s)  Follow up Falls evaluation completed;Falls  prevention discussed;Education provided   PHQ 2/9 Scores 03/05/2019  PHQ - 2 Score 0   THN CM Care Plan Problem One     Most Recent Value  Care Plan Problem One  Knowledge deficit in self management of hypertension  Role Documenting the Problem One  Yell for Problem One  Active  [facility] Long Term Goal   Patient will have a better understanding of what blood pressure reading mean and monitoring within the next 90 days  THN Long Term Goal Start Date  03/05/19  Interventions for Problem One Long Term Goal  RN discussed monitoring blood pressure. RN sent a calendar book for documention. RN sent Educational material on How to rtake yur blood pressure and what it means. RN will follow up with further discussion  THN CM Short Term Goal #1   RN will have a better understanding of foods high and low in sodium withn the next 30 days  THN CM Short Term Goal #1 Start Date  03/05/19  Interventions for Short Term Goal #1  RN discussed foods high and low in sodium.RN sent picture sent shhet on foods high and low in sodium. RN will follow up with further discussion  THN CM Short Term Goal #2 Start Date  03/05/19     Assessment:  Patient monitors blood pressure occasionally Patient gets meals on wheels and family brings meals Patient grand daughter takes for Dr appointments Patient does not monitor the sodium in food. Does not add additional Na to food Patient had recent fall with no injuries Plan:  RN sent education material in A Matter of choice blood pressure control.  RN discussed fall prevention RN sent a 2021 calendar book RN sent a welcome packet RN discussed foods high and low in sodium RN sent a picture sheet of foods low and high in sodium RN sent welcome packet RN sent assessment and barriers letter to PCP RN will follow up outreach within the month of February  Shyloh Krinke [facility] [phone]

## 2019-03-13 ENCOUNTER — Ambulatory Visit (INDEPENDENT_AMBULATORY_CARE_PROVIDER_SITE_OTHER): Payer: Medicare Other | Admitting: Physician Assistant

## 2019-03-13 ENCOUNTER — Encounter: Payer: Self-pay | Admitting: Physician Assistant

## 2019-03-13 VITALS — BP 140/82 | HR 92 | Temp 98.9°F | Ht 63.0 in | Wt 156.0 lb

## 2019-03-13 DIAGNOSIS — K219 Gastro-esophageal reflux disease without esophagitis: Secondary | ICD-10-CM

## 2019-03-13 NOTE — Patient Instructions (Signed)
If you are age 83 or older, your body mass index should be between 23-30. Your Body mass index is 27.63 kg/m. If this is out of the aforementioned range listed, please consider follow up with your Primary Care Provider.  If you are age 31 or younger, your body mass index should be between 19-25. Your Body mass index is 27.63 kg/m. If this is out of the aformentioned range listed, please consider follow up with your Primary Care Provider.   Please call with name of medication that you need a refill for.  Follow up as needed.   Thank you for choosing me and [facility].    Ellouise Newer, PA-C

## 2019-03-13 NOTE — Progress Notes (Signed)
Chief Complaint: GERD  HPI:    [name] is a 85 year old Caucasian female with a past medical history of A. fib and reflux, known to Dr. Carlean Purl, who presents to clinic today for follow-up of her reflux.      Today, the patient presents clinic and tells me that she is feeling well.  She has been taking Omeprazole 20 mg 30 minutes before dinner and has had no further problems with reflux.      Tells me occasionally when she uses pain medication she becomes somewhat constipated but uses a stool softener and this relieves it.  Denies any other GI complaints or concerns today.    Patient does tell that she is somewhat short of breath but explains that she is living at home and has been quarantined at home by her family, she does not really get out or walk farther than just to her sink.    Denies fever, chills, weight loss, change in bowel habits, abdominal pain, heartburn or reflux.  Past Medical History:  Diagnosis Date  . Anxiety   . Atrial fibrillation ([place])   . Bradycardia   . Cancer ([place])    skin  . Cataract    removed bilat with lens both eyes   . Complication of anesthesia   . Difficulty sleeping   . Dysrhythmia    A FIB  . GERD (gastroesophageal reflux disease)   . H/O hiatal hernia   . History of kidney stones   . History of skin cancer   . History of transfusion   . Hyperlipidemia   . Hypertension   . Osteoarthritis   . Pacemaker   . Pneumonia   . PONV (postoperative nausea and vomiting)   . Shortness of breath    WITH EXERTION    Past Surgical History:  Procedure Laterality Date  . APPENDECTOMY  1960  . ARTHROPLASTY  1994   Left total knee  . ARTHROPLASTY  2000   Total right knee  . BACK SURGERY    . CATARACT EXTRACTION, BILATERAL     with lens implants  . CHOLECYSTECTOMY    . COLONOSCOPY    . DG SELECTED HSG GDC ONLY  2002   Dilation  . Microdiskectomy  05/2004   Left L4-L5  . PACEMAKER INSERTION    . POLYPECTOMY    . TOTAL ABDOMINAL HYSTERECTOMY   1969  . TOTAL HIP ARTHROPLASTY Right 12/24/2013   Procedure: RIGHT TOTAL HIP ARTHROPLASTY ANTERIOR APPROACH;  Surgeon: Gearlean Alf, MD;  Location: [facility];  Service: Orthopedics;  Laterality: Right;  . UPPER GASTROINTESTINAL ENDOSCOPY      Current Outpatient Medications  Medication Sig Dispense Refill  . acetaminophen (TYLENOL) 325 MG tablet Take 650 mg by mouth every 6 (six) hours as needed for mild pain.     Marland Kitchen amLODipine (NORVASC) 10 MG tablet Take 10 mg by mouth every morning.     . clonazePAM (KLONOPIN) 0.5 MG tablet Take 0.5 mg by mouth at bedtime as needed for anxiety.     . docusate sodium (COLACE) 100 MG capsule Take 100 mg by mouth daily as needed for mild constipation.    . enoxaparin (LOVENOX) 80 MG/0.8ML injection as needed.    . feeding supplement, ENSURE ENLIVE, (ENSURE ENLIVE) LIQD Take 237 mLs by mouth 2 (two) times daily between meals. 60 Bottle 0  . furosemide (LASIX) 20 MG tablet Take 1 tablet (20 mg total) by mouth daily. 90 tablet 3  . HYDROcodone-acetaminophen (NORCO) 5-325  MG tablet Take 0.5 tablets by mouth as needed.     . irbesartan (AVAPRO) 300 MG tablet Take 300 mg by mouth every morning.    Marland Kitchen omeprazole (PRILOSEC) 20 MG capsule TAKE 1 CAPSULE (20 MG TOTAL) BY MOUTH DAILY. TAKE 30-60 MIN BEFORE DINNER 90 capsule 3  . ondansetron (ZOFRAN) 4 MG tablet Take 2 mg by mouth as needed for nausea or vomiting.    . potassium chloride SA (K-DUR) 20 MEQ tablet Take 20 mEq by mouth 2 (two) times daily.    . pravastatin (PRAVACHOL) 40 MG tablet Take 40 mg by mouth every morning.     . warfarin (COUMADIN) 2 MG tablet Take 1-2 tablets (2-4 mg total) by mouth See admin instructions. Take 4 mg by mouth on MWF and 2 mg all other days     No current facility-administered medications for this visit.    Allergies as of 03/13/2019 - Review Complete 03/13/2019  Allergen Reaction Noted  . Morphine and related Nausea And Vomiting 01/08/2012    Family History  Problem Relation Age  of Onset  . Heart disease Mother   . Leukemia Father   . Colon cancer Neg Hx   . Esophageal cancer Neg Hx   . Stomach cancer Neg Hx   . Pancreatic cancer Neg Hx   . Liver disease Neg Hx   . Colon polyps Neg Hx     Social History   Socioeconomic History  . Marital status: Widowed    Spouse name: Not on file  . Number of children: 2  . Years of education: Not on file  . Highest education level: Not on file  Occupational History  . Occupation: retired  Tobacco Use  . Smoking status: Former Smoker    Quit date: 12/18/1973    Years since quitting: 45.2  . Smokeless tobacco: Never Used  . Tobacco comment: Smoker for 25 years and quit at around age 33  Substance and Sexual Activity  . Alcohol use: No  . Drug use: No  . Sexual activity: Not Currently  Other Topics Concern  . Not on file  Social History Narrative   Married, husband's name is Warner Mccreedy, they have been married since 1950   2 sons and 4 grandchildren         Social Determinants of Health   Financial Resource Strain:   . Difficulty of Paying Living Expenses: Not on file  Food Insecurity:   . Worried About Charity fundraiser in the Last Year: Not on file  . Ran Out of Food in the Last Year: Not on file  Transportation Needs:   . Lack of Transportation (Medical): Not on file  . Lack of Transportation (Non-Medical): Not on file  Physical Activity:   . Days of Exercise per Week: Not on file  . Minutes of Exercise per Session: Not on file  Stress:   . Feeling of Stress : Not on file  Social Connections:   . Frequency of Communication with Friends and Family: Not on file  . Frequency of Social Gatherings with Friends and Family: Not on file  . Attends Religious Services: Not on file  . Active Member of Clubs or Organizations: Not on file  . Attends Archivist Meetings: Not on file  . Marital Status: Not on file  Intimate Partner Violence:   . Fear of Current or Ex-Partner: Not on file  . Emotionally  Abused: Not on file  . Physically Abused: Not on file  .  Sexually Abused: Not on file    Review of Systems:    Constitutional: No weight loss, fever or chills Cardiovascular: No chest pain Respiratory: No cough Gastrointestinal: See HPI and otherwise negative   Physical Exam:  Vital signs: BP 140/82 (BP Location: Left Arm, Patient Position: Sitting)   Pulse 92   Temp 98.9 F (37.2 C)   Ht 5\' 3"  (1.6 m)   Wt 156 lb (70.8 kg)   SpO2 94%   BMI 27.63 kg/m   Constitutional:   Pleasant Caucasian female appears to be in NAD, Well developed, Well nourished, alert and cooperative Respiratory: Respirations even and slightly labored. Lungs clear to auscultation bilaterally.   No wheezes, crackles, or rhonchi.  Cardiovascular: Normal S1, S2. No MRG. Regular rate and rhythm. No peripheral edema, cyanosis or pallor.  Gastrointestinal:  Soft, nondistended, nontender. No rebound or guarding. Normal bowel sounds. No appreciable masses or hepatomegaly. Rectal:  Not performed.  Psychiatric: Demonstrates good judgement and reason without abnormal affect or behaviors.  MOST RECENT LABS AND IMAGING: CBC    Component Value Date/Time   WBC 7.7 02/12/2019 0931   WBC 7.8 10/18/2018 1915   RBC 4.16 02/12/2019 0931   RBC 3.85 (L) 10/18/2018 1915   HGB 10.0 (L) 02/12/2019 0931   HCT 32.4 (L) 02/12/2019 0931   PLT 384 02/12/2019 0931   MCV 78 (L) 02/12/2019 0931   MCH 24.0 (L) 02/12/2019 0931   MCH 26.0 10/18/2018 1915   MCHC 30.9 (L) 02/12/2019 0931   MCHC 30.8 10/18/2018 1915   RDW 15.8 (H) 02/12/2019 0931   LYMPHSABS 1.1 02/12/2019 0931   MONOABS 0.9 10/18/2018 1915   EOSABS 0.1 02/12/2019 0931   BASOSABS 0.0 02/12/2019 0931    CMP     Component Value Date/Time   NA 132 (L) 02/12/2019 0931   K 4.3 02/12/2019 0931   CL 95 (L) 02/12/2019 0931   CO2 20 02/12/2019 0931   GLUCOSE 81 02/12/2019 0931   GLUCOSE 102 (H) 10/18/2018 1915   BUN 15 02/12/2019 0931   CREATININE 0.93 02/12/2019  0931   CALCIUM 9.4 02/12/2019 0931   PROT 6.8 10/18/2018 1915   ALBUMIN 3.8 10/18/2018 1915   AST 19 10/18/2018 1915   ALT 17 10/18/2018 1915   ALKPHOS 84 10/18/2018 1915   BILITOT 0.6 10/18/2018 1915   GFRNONAA 54 (L) 02/12/2019 0931   GFRAA 62 02/12/2019 0931    Assessment: 1.  GERD: Controlled on Omeprazole 20 mg daily  Plan: 1.  Patient tells me it is cheaper for her to buy Omeprazole over-the-counter.  Discussed that she could try buying it or someone in her family could try buying it at [facility] or a [employer] as this may be even cheaper for her.  She will continue this medication 30 to 60 minutes before dinner every day. 2.  Explained to the patient that she should follow with her PCP in regards to shortness of breath. 3.  Patient to follow in clinic with myself or Dr. Carlean Purl as needed in the future.  Ellouise Newer, PA-C [facility] 03/13/2019, 2:11 PM  Cc: [name], MD

## 2019-03-29 ENCOUNTER — Telehealth: Payer: Self-pay | Admitting: Internal Medicine

## 2019-03-29 NOTE — Telephone Encounter (Signed)
Outreach made to pt.  Pt is having increased sob and some wheezing.  Pt currently takes lasix 20 mg daily.  Pt was started on lasix in October.  Per Pt her breathing improved in October after starting the lasix.  Advised Pt to increase her lasix to 40 mg daily today, Friday and Saturday.  Return to daily on Sunday.  Advised this nurse would call Pt on Monday to see if she has had improvement in her breathing.  Pt indicates understanding and agrees with plan.  Did advise Pt if her breathing continue to deteriorate she may have to consider going to the ER.  Pt assures me she is fine.  Call placed to Pt's son Dominica Severin with permission from Pt.  Lajean Silvius of plan for this weekend.  Dominica Severin in agreement and thanked nurse (and Dr. Lovena Le) for care over the past year.  Will call Pt on Monday to see if any improvement in breathing.

## 2019-03-29 NOTE — Telephone Encounter (Signed)
Spoke with pt who states she has been having increased SOB with wheezing x a few weeks.  Pt states she is having some swelling in her feet and legs but it goes down at night.  Pt denies current CP and that she is also taking Mucinex for the wheezing.  Information routed to Willeen Cass RN, Dr Tanna Furry nurse per her request and states she will contact pt re: her symptoms.

## 2019-03-29 NOTE — Telephone Encounter (Signed)
New Message  Pt c/o Shortness Of Breath: STAT if SOB developed within the last 24 hours or pt is noticeably SOB on the phone  1. Are you currently SOB (can you hear that pt is SOB on the phone)? Yes  2. How long have you been experiencing SOB? 2 months, but has gotten worse  3. Are you SOB when sitting or when up moving around? Both, but mostly when moving around  4. Are you currently experiencing any other symptoms? SOB and lack of energy.

## 2019-04-02 MED ORDER — FUROSEMIDE 20 MG PO TABS: 40.0000 mg | ORAL_TABLET | Freq: Every day | ORAL | 3 refills | Status: AC

## 2019-04-02 NOTE — Telephone Encounter (Signed)
Call returned to pt.  Pt states her breathing has improved on the increased lasix dose.  Per Dr. Lovena Le ok for Pt to take lasix 40 mg daily.  Asked Pt to also contact her PCP for continued cough/mucus.    Pt will consider.

## 2019-04-03 ENCOUNTER — Encounter: Payer: Self-pay | Admitting: Internal Medicine

## 2019-04-03 ENCOUNTER — Telehealth: Payer: Self-pay | Admitting: Internal Medicine

## 2019-04-03 DIAGNOSIS — I495 Sick sinus syndrome: Secondary | ICD-10-CM

## 2019-04-03 NOTE — Telephone Encounter (Signed)
Follow Up:     Pt said somebody called her son this morning trying to contact her. She does not know who it was that called.

## 2019-05-08 ENCOUNTER — Other Ambulatory Visit: Payer: Self-pay | Admitting: *Deleted

## 2019-05-08 NOTE — Patient Outreach (Signed)
[facility]) Care Management  [place]  05/08/2019   [name] 08-Nov-1925 [phone]  RN Health Coach telephone call to patient.  Hipaa compliance verified. Per patient she is doing good. Patient stated she went to the Dr today and she doesn't remember what the reading was but the Dr told her it was good. Per patients she has been trying to eat healthy. She has been getting meals on wheels , but have been unable to eat all of it and the food her family brings. Patient has told meals on wheels to hold for a month. .Patient is now having to use her cane to walk at all times for safety. Patient has not had any falls since last outreach. Patient stated she does not cook or do the housework. Her family assists and she has a Secretary/administrator. Patient stated that her iron is low but she has to monitor her diet since on Coumadin. She is taking iron pills. Per patient she may have to do iron infusions.  Patient has agreed to further outreach calls  Encounter Medications:  Outpatient Encounter Medications as of 05/08/2019  Medication Sig  . acetaminophen (TYLENOL) 325 MG tablet Take 650 mg by mouth every 6 (six) hours as needed for mild pain.   Marland Kitchen amLODipine (NORVASC) 10 MG tablet Take 10 mg by mouth every morning.   . clonazePAM (KLONOPIN) 0.5 MG tablet Take 0.5 mg by mouth at bedtime as needed for anxiety.   . docusate sodium (COLACE) 100 MG capsule Take 100 mg by mouth daily as needed for mild constipation.  . enoxaparin (LOVENOX) 80 MG/0.8ML injection as needed.  . feeding supplement, ENSURE ENLIVE, (ENSURE ENLIVE) LIQD Take 237 mLs by mouth 2 (two) times daily between meals.  . furosemide (LASIX) 20 MG tablet Take 2 tablets (40 mg total) by mouth daily.  Marland Kitchen HYDROcodone-acetaminophen (NORCO) 5-325 MG tablet Take 0.5 tablets by mouth as needed.   . irbesartan (AVAPRO) 300 MG tablet Take 300 mg by mouth every morning.  Marland Kitchen omeprazole (PRILOSEC) 20 MG capsule TAKE 1 CAPSULE (20 MG  TOTAL) BY MOUTH DAILY. TAKE 30-60 MIN BEFORE DINNER  . ondansetron (ZOFRAN) 4 MG tablet Take 2 mg by mouth as needed for nausea or vomiting.  . potassium chloride SA (K-DUR) 20 MEQ tablet Take 20 mEq by mouth 2 (two) times daily.  . pravastatin (PRAVACHOL) 40 MG tablet Take 40 mg by mouth every morning.   . warfarin (COUMADIN) 2 MG tablet Take 1-2 tablets (2-4 mg total) by mouth See admin instructions. Take 4 mg by mouth on MWF and 2 mg all other days   No facility-administered encounter medications on file as of 05/08/2019.    Functional Status:  In your present state of health, do you have any difficulty performing the following activities: 03/05/2019 08/09/2018  Hearing? Y N  Comment bilateral hearing aides -  Vision? N N  Comment 20/20 vision with lens implant./ sometimes will wear low strength reading glasses -  Difficulty concentrating or making decisions? N N  Walking or climbing stairs? Y N  Comment abulates with a cane. has back and neck discomfort -  Dressing or bathing? N N  Doing errands, shopping? Y N  Comment sons and grand daughter and grandson care for patient -  Conservation officer, nature and eating ? Y -  Comment meals on wheels and family fixes meals -  Using the Toilet? N -  In the past six months, have you accidently leaked urine?  N -  Do you have problems with loss of bowel control? N -  Managing your Medications? Y -  Comment Daughter prepares medications -  Managing your Finances? N -  Housekeeping or managing your Housekeeping? Y -  Comment Family cleans home and changes bed -  Some recent data might be hidden    Fall/Depression Screening: Fall Risk  05/08/2019 03/05/2019  Falls in the past year? 1 1  Number falls in past yr: 1 0  Injury with Fall? 0 0  Risk for fall due to : History of fall(s);Impaired balance/gait;Impaired mobility History of fall(s)  Follow up Falls evaluation completed;Falls prevention discussed Falls evaluation completed;Falls prevention  discussed;Education provided   PHQ 2/9 Scores 05/08/2019 03/05/2019  PHQ - 2 Score 0 0   THN CM Care Plan Problem One     Most Recent Value  Role Documenting the Problem One  [place] for Problem One  Active  THN Long Term Goal   Patient will have a better understanding of what blood pressure reading mean and monitoring within the next 90 days  THN Long Term Goal Start Date  05/08/19  Interventions for Problem One Long Term Goal  Patient reiterating the importanceof checking blood pressure at home . Patient stated she will check in more frequently at home. Rn will follow up for further discussion  THN CM Short Term Goal #1   RN will have a better understanding of foods high and low in sodium withn the next 30 days  THN CM Short Term Goal #1 Start Date  05/08/19  Interventions for Short Term Goal #1  RN reiterated monitoring the sodium intake the best she can since the food is all brought in by meals on wheels and family. RN will follow up with further discussion  THN CM Short Term Goal #2   Patient will follow up on Health Care maintenance within the next 30 days  THN CM Short Term Goal #2 Start Date  05/08/19  [facility] CM Short Term Goal #2 Met Date  05/08/19  Interventions for Short Term Goal #2  RN discussed health maintenance, eye exam and COVID vaccine. RN will follow up for further discussion       Assessment:  Patient has not been checking blood pressure daily at home Patient went for appointment today and stated that it was good She is monitoring the sodium in her diet Patient is having neck pain and pending injection Plan:  Patient will start monitoring blood pressure at home more frequently Patient will continue to monitor the sodium in diet RN discussed fall prevention Patient has a very good support system RN will follow up within the month of April  Cochise Dinneen [place] [facility] [phone]

## 2019-05-18 ENCOUNTER — Other Ambulatory Visit: Payer: Self-pay

## 2019-05-18 ENCOUNTER — Emergency Department (HOSPITAL_COMMUNITY)
Admission: EM | Admit: 2019-05-18 | Discharge: 2019-05-18 | Disposition: A | Discharge status: Home / Self Care | Payer: Medicare Other | Source: Home / Self Care | Attending: Emergency Medicine | Admitting: Emergency Medicine

## 2019-05-18 ENCOUNTER — Emergency Department (HOSPITAL_COMMUNITY): Payer: Medicare Other

## 2019-05-18 ENCOUNTER — Encounter (HOSPITAL_COMMUNITY): Payer: Self-pay | Admitting: Emergency Medicine

## 2019-05-18 DIAGNOSIS — Z87891 Personal history of nicotine dependence: Secondary | ICD-10-CM | POA: Insufficient documentation

## 2019-05-18 DIAGNOSIS — R410 Disorientation, unspecified: Secondary | ICD-10-CM | POA: Insufficient documentation

## 2019-05-18 DIAGNOSIS — I10 Essential (primary) hypertension: Secondary | ICD-10-CM | POA: Insufficient documentation

## 2019-05-18 DIAGNOSIS — I611 Nontraumatic intracerebral hemorrhage in hemisphere, cortical: Secondary | ICD-10-CM | POA: Diagnosis not present

## 2019-05-18 DIAGNOSIS — I619 Nontraumatic intracerebral hemorrhage, unspecified: Secondary | ICD-10-CM | POA: Diagnosis not present

## 2019-05-18 DIAGNOSIS — R519 Headache, unspecified: Secondary | ICD-10-CM

## 2019-05-18 DIAGNOSIS — Z79899 Other long term (current) drug therapy: Secondary | ICD-10-CM | POA: Insufficient documentation

## 2019-05-18 DIAGNOSIS — G44209 Tension-type headache, unspecified, not intractable: Secondary | ICD-10-CM | POA: Insufficient documentation

## 2019-05-18 DIAGNOSIS — Z950 Presence of cardiac pacemaker: Secondary | ICD-10-CM | POA: Insufficient documentation

## 2019-05-18 DIAGNOSIS — Z85820 Personal history of malignant melanoma of skin: Secondary | ICD-10-CM | POA: Insufficient documentation

## 2019-05-18 DIAGNOSIS — Z7901 Long term (current) use of anticoagulants: Secondary | ICD-10-CM | POA: Insufficient documentation

## 2019-05-18 LAB — CBC WITH DIFFERENTIAL/PLATELET
Abs Immature Granulocytes: 0.02 10*3/uL (ref 0.00–0.07)
Basophils Absolute: 0 10*3/uL (ref 0.0–0.1)
Basophils Relative: 0 %
Eosinophils Absolute: 0.1 10*3/uL (ref 0.0–0.5)
Eosinophils Relative: 1 %
HCT: 38.3 % (ref 36.0–46.0)
Hemoglobin: 11.9 g/dL — ABNORMAL LOW (ref 12.0–15.0)
Immature Granulocytes: 0 %
Lymphocytes Relative: 10 %
Lymphs Abs: 0.7 10*3/uL (ref 0.7–4.0)
MCH: 26.2 pg (ref 26.0–34.0)
MCHC: 31.1 g/dL (ref 30.0–36.0)
MCV: 84.2 fL (ref 80.0–100.0)
Monocytes Absolute: 0.5 10*3/uL (ref 0.1–1.0)
Monocytes Relative: 7 %
Neutro Abs: 5.6 10*3/uL (ref 1.7–7.7)
Neutrophils Relative %: 82 %
Platelets: 301 10*3/uL (ref 150–400)
RBC: 4.55 MIL/uL (ref 3.87–5.11)
RDW: 23.9 % — ABNORMAL HIGH (ref 11.5–15.5)
WBC: 6.9 10*3/uL (ref 4.0–10.5)
nRBC: 0 % (ref 0.0–0.2)

## 2019-05-18 LAB — BASIC METABOLIC PANEL
Anion gap: 11 (ref 5–15)
BUN: <5 mg/dL — ABNORMAL LOW (ref 8–23)
CO2: 27 mmol/L (ref 22–32)
Calcium: 9.3 mg/dL (ref 8.9–10.3)
Chloride: 96 mmol/L — ABNORMAL LOW (ref 98–111)
Creatinine, Ser: 0.78 mg/dL (ref 0.44–1.00)
GFR calc Af Amer: >60 mL/min (ref >60)
GFR calc non Af Amer: >60 mL/min (ref >60)
Glucose, Bld: 119 mg/dL — ABNORMAL HIGH (ref 70–99)
Potassium: 3.8 mmol/L (ref 3.5–5.1)
Sodium: 134 mmol/L — ABNORMAL LOW (ref 135–145)

## 2019-05-18 LAB — PROTIME-INR
INR: 1 (ref 0.8–1.2)
Prothrombin Time: 13 seconds (ref 11.4–15.2)

## 2019-05-18 MED ORDER — PROCHLORPERAZINE EDISYLATE 10 MG/2ML IJ SOLN
10.0000 mg | Freq: Once | INTRAMUSCULAR | Status: DC
  Filled 2019-05-18: qty 2

## 2019-05-18 MED ORDER — ONDANSETRON HCL 4 MG/2ML IJ SOLN
4.0000 mg | Freq: Once | INTRAMUSCULAR | Status: AC
  Administered 2019-05-18: 4 mg via INTRAVENOUS
  Filled 2019-05-18: qty 2

## 2019-05-18 MED ORDER — IOHEXOL 350 MG/ML SOLN
80.0000 mL | Freq: Once | INTRAVENOUS | Status: AC | PRN
  Administered 2019-05-18: 80 mL via INTRAVENOUS

## 2019-05-18 MED ORDER — DIPHENHYDRAMINE HCL 50 MG/ML IJ SOLN
12.5000 mg | Freq: Once | INTRAMUSCULAR | Status: AC
  Administered 2019-05-18: 15:00:00 12.5 mg via INTRAVENOUS
  Filled 2019-05-18: qty 1

## 2019-05-18 MED ORDER — KETOROLAC TROMETHAMINE 15 MG/ML IJ SOLN
15.0000 mg | Freq: Once | INTRAMUSCULAR | Status: AC
  Administered 2019-05-18: 15 mg via INTRAVENOUS
  Filled 2019-05-18: qty 1

## 2019-05-18 MED ORDER — DIPHENHYDRAMINE HCL 50 MG/ML IJ SOLN
12.5000 mg | Freq: Once | INTRAMUSCULAR | Status: DC
  Filled 2019-05-18: qty 1

## 2019-05-18 MED ORDER — METOCLOPRAMIDE HCL 5 MG/ML IJ SOLN
10.0000 mg | Freq: Once | INTRAMUSCULAR | Status: AC
  Administered 2019-05-18: 10 mg via INTRAVENOUS
  Filled 2019-05-18: qty 2

## 2019-05-18 NOTE — ED Provider Notes (Signed)
[facility] Provider Note   CSN: [id number] Arrival date & time: 05/18/19  1135     History Chief Complaint  Patient presents with  . Headache  . Weakness    [name] is a 86 y.o. female with a past medical history significant for anxiety, A. fib on chronic Coumadin, hypertension, GERD, and pacemaker in place who presents to the ED due to worsening headache x 1 day. Patient describes her headache as "the worst headache of her life" located throughout her head associated with right eye visual changes and nausea. She denies head injury. Headache is also associated with confusion. She states she "can't think straight" due to the pain.  Also admits to phonophobia and photophobia.  She has tried one half of a pain pill with no relief.  She denies history of migraine headaches.  Denies fever and chills.  Denies abdominal pain, vomiting, diarrhea, chest pain, and shortness of breath.  Patient notes she has difficulties finding certain words, but denies changes to speech and unilateral weakness.  Denies facial droop.     Past Medical History:  Diagnosis Date  . Anxiety   . Atrial fibrillation ([place])   . Bradycardia   . Cancer ([place])    skin  . Cataract    removed bilat with lens both eyes   . Complication of anesthesia   . Difficulty sleeping   . Dysrhythmia    A FIB  . GERD (gastroesophageal reflux disease)   . H/O hiatal hernia   . History of kidney stones   . History of skin cancer   . History of transfusion   . Hyperlipidemia   . Hypertension   . Osteoarthritis   . Pacemaker   . Pneumonia   . PONV (postoperative nausea and vomiting)   . Shortness of breath    WITH EXERTION    Patient Active Problem List   Diagnosis Date Noted  . V tach ([place]) 08/09/2018  . Intractable nausea and vomiting 08/09/2018  . Hyponatremia 08/09/2018  . Hypomagnesemia 08/09/2018  . Hyperthyroidism 08/09/2018  . Epigastric pain   . Nonspecific  chest pain 07/18/2018  . Anxiety 07/18/2018  . Insomnia 07/18/2018  . Complication of anesthesia   . Intractable headache 01/06/2017  . Intractable pain 01/04/2017  . Chronic anticoagulation 01/03/2017  . Neck pain 01/03/2017  . Acute bronchitis 06/08/2016  . Fall 09/24/2015  . Dizziness 09/24/2015  . Postoperative anemia due to acute blood loss 12/27/2013  . Postop Transfusion 12/27/2013  . OA (osteoarthritis) of hip 12/24/2013  . Preop cardiovascular exam 09/25/2013  . Cardiac pacemaker in situ 08/29/2008  . Essential hypertension 08/21/2008  . ATRIAL FIBRILLATION 08/21/2008  . Bradycardia 08/21/2008  . PNEUMONIA 08/21/2008  . GERD 08/21/2008  . NEPHROLITHIASIS 08/21/2008  . Osteoarthritis 08/21/2008    Past Surgical History:  Procedure Laterality Date  . APPENDECTOMY  1960  . ARTHROPLASTY  1994   Left total knee  . ARTHROPLASTY  2000   Total right knee  . BACK SURGERY    . CATARACT EXTRACTION, BILATERAL     with lens implants  . CHOLECYSTECTOMY    . COLONOSCOPY    . DG SELECTED HS[facility] GDC ONLY  2002   Dilation  . Microdiskectomy  05/2004   Left L4-L5  . PACEMAKER INSERTION    . POLYPECTOMY    . TOTAL ABDOMINAL HYSTERECTOMY  1969  . TOTAL HIP ARTHROPLASTY Right 12/24/2013   Procedure: RIGHT TOTAL HIP ARTHROPLASTY ANTERIOR APPROACH;  Surgeon: Gearlean Alf, MD;  Location: [facility];  Service: Orthopedics;  Laterality: Right;  . UPPER GASTROINTESTINAL ENDOSCOPY       OB History   No obstetric history on file.     Family History  Problem Relation Age of Onset  . Heart disease Mother   . Leukemia Father   . Colon cancer Neg Hx   . Esophageal cancer Neg Hx   . Stomach cancer Neg Hx   . Pancreatic cancer Neg Hx   . Liver disease Neg Hx   . Colon polyps Neg Hx     Social History   Tobacco Use  . Smoking status: Former Smoker    Quit date: 12/18/1973    Years since quitting: 45.4  . Smokeless tobacco: Never Used  . Tobacco comment: Smoker for 25 years and  quit at around age 84  Substance Use Topics  . Alcohol use: No  . Drug use: No    Home Medications Prior to Admission medications   Medication Sig Start Date End Date Taking? Authorizing Provider  acetaminophen (TYLENOL) 325 MG tablet Take 650 mg by mouth every 6 (six) hours as needed for mild pain.     [provider]  amLODipine (NORVASC) 10 MG tablet Take 10 mg by mouth every morning.     [provider]  clonazePAM (KLONOPIN) 0.5 MG tablet Take 0.5 mg by mouth at bedtime as needed for anxiety.  03/18/15   [provider]  docusate sodium (COLACE) 100 MG capsule Take 100 mg by mouth daily as needed for mild constipation.    [provider]  enoxaparin (LOVENOX) 80 MG/0.8ML injection as needed. 12/22/18   [provider]  feeding supplement, ENSURE ENLIVE, (ENSURE ENLIVE) LIQD Take 237 mLs by mouth 2 (two) times daily between meals. 08/11/18   Mercy Riding, MD  furosemide (LASIX) 20 MG tablet Take 2 tablets (40 mg total) by mouth daily. 04/02/19 07/01/19  Evans Lance, MD  HYDROcodone-acetaminophen (NORCO) 5-325 MG tablet Take 0.5 tablets by mouth as needed.     [provider]  irbesartan (AVAPRO) 300 MG tablet Take 300 mg by mouth every morning.    [provider]  omeprazole (PRILOSEC) 20 MG capsule TAKE 1 CAPSULE (20 MG TOTAL) BY MOUTH DAILY. TAKE 30-60 MIN BEFORE DINNER 02/19/19   Levin Erp, PA  ondansetron (ZOFRAN) 4 MG tablet Take 2 mg by mouth as needed for nausea or vomiting.    [provider]  potassium chloride SA (K-DUR) 20 MEQ tablet Take 20 mEq by mouth 2 (two) times daily. 04/29/18   [provider]  pravastatin (PRAVACHOL) 40 MG tablet Take 40 mg by mouth every morning.     [provider]  warfarin (COUMADIN) 2 MG tablet Take 1-2 tablets (2-4 mg total) by mouth See admin instructions. Take 4 mg by mouth on MWF and 2 mg all other days 07/19/18   Geradine Girt, DO     Allergies    Morphine and related  Review of Systems   Review of Systems  Constitutional: Negative for chills and fever.  Respiratory: Negative for shortness of breath.   Cardiovascular: Negative for chest pain.  Gastrointestinal: Positive for nausea. Negative for abdominal pain, diarrhea and vomiting.  Neurological: Positive for speech difficulty (difficulty finding words), weakness (generalized) and headaches. Negative for facial asymmetry and numbness.  All other systems reviewed and are negative.   Physical Exam Updated Vital Signs BP (!) 179/70 (BP  Location: Right Arm)   Pulse 64   Temp 98.3 F (36.8 C) (Oral)   Resp 15   Ht 5\' 3"  (1.6 m)   Wt 68 kg   SpO2 96%   BMI 26.57 kg/m   Physical Exam Vitals and nursing note reviewed.  Constitutional:      General: She is not in acute distress.    Appearance: She is not toxic-appearing.     Comments: Appears to be in a significant amount of pain with the lights off in the room.   HENT:     Head: Normocephalic.  Eyes:     Extraocular Movements: Extraocular movements intact.     Pupils: Pupils are equal, round, and reactive to light.     Comments: Pinpoint pupils. Unable to visual number in right peripheral vision. Fields of confrontation intact.  Neck:     Comments: No meningismus.  Full range of motion of neck. Cardiovascular:     Rate and Rhythm: Normal rate and regular rhythm.     Pulses: Normal pulses.     Heart sounds: Normal heart sounds. No murmur. No friction rub. No gallop.   Pulmonary:     Effort: Pulmonary effort is normal.     Breath sounds: Normal breath sounds.  Abdominal:     General: Abdomen is flat. There is no distension.     Palpations: Abdomen is soft.     Tenderness: There is no abdominal tenderness. There is no guarding or rebound.  Musculoskeletal:     Cervical back: Neck supple.     Comments: Able to move all 4 extremities without difficulty. No lower extremity edema.   Skin:    General:  Skin is warm and dry.  Neurological:     General: No focal deficit present.     Mental Status: She is alert.     Comments: Speech is clear, able to follow commands CN III-XII intact Normal strength in upper and lower extremities bilaterally including dorsiflexion and plantar flexion, strong and equal grip strength Sensation grossly intact throughout Moves extremities without ataxia, coordination intact No pronator drift      ED Results / Procedures / Treatments   Labs (all labs ordered are listed, but only abnormal results are displayed) Labs Reviewed  CBC WITH DIFFERENTIAL/PLATELET  BASIC METABOLIC PANEL    EKG None  Radiology No results found.  Procedures Procedures (including critical care time)  Medications Ordered in ED Medications  prochlorperazine (COMPAZINE) injection 10 mg (has no administration in time range)  diphenhydrAMINE (BENADRYL) injection 12.5 mg (has no administration in time range)    ED Course  I have reviewed the triage vital signs and the nursing notes.  Pertinent labs & imaging results that were available during my care of the patient were reviewed by me and considered in my medical decision making (see chart for details).    MDM Rules/Calculators/A&P                     86 year old female presents to the ED due to severe headache x1 day.  Patient describes headache as "worst headache of her life".  Denies head injury. Stable vitals. Patient in no acute distress and non-toxic appearing, but appears to be in significant amount of pain. Normal neurological exam except for possible visual disturbance of right eye in peripheral vision. Will obtain routine labs and CTA. Discussed case with Dr. Alvino Chapel who evaluated patient at bedside and agrees with assessment and plan.   CBC reassuring  with no leukocytosis.  BMP reassuring.  PT/INR within normal limits. CTA personally reviewed which demonstrates: 1. No subarachnoid hemorrhage. Normal CT appearance  of the  noncontrast head.  2. Moderate tortuosity of the cervical internal carotid arteries  bilaterally without significant stenosis relative to the more distal  vessels.  3. Mild bilateral carotid bifurcation atherosclerotic changes  without significant stenosis.  4. Additional atherosclerotic changes at the aortic arch and within  the cavernous internal carotid arteries without significant  stenosis.  5. Multilevel spondylosis of the cervical spine.  6. Aortic Atherosclerosis (ICD10-I70.0).   Given patient's questionable neuro deficit, will consult neurology. Unable to obtain MRI given patient has a pacemaker. Discussed case with Dr. Leonel Ramsay with neurology who recommends treating headache as migraine and if symptoms resolve, patient can be discharged home. If symptoms persist, will reconsult neurology.   Patient handed off to Suella Broad, PA-C at shift change who will reassess patient and determine disposition. If symptoms resolve after Phenergan, patient may be discharged home. If symptoms persist, consult neurology for further evaluation.  Final Clinical Impression(s) / ED Diagnoses Final diagnoses:  None    Rx / DC Orders ED Discharge Orders    None       Karie Kirks 05/18/19 1549    Davonna Belling, MD 05/18/19 1601

## 2019-05-18 NOTE — Discharge Instructions (Addendum)
Follow-up with your doctor in the next few days, return to the emergency room if your headache gets worse or you develop any other new or concerning symptoms.

## 2019-05-18 NOTE — ED Triage Notes (Signed)
Pt arrives via EMS from home with headache that began last night. Pt reports headache worsened today and had been feeling generalized weakness, confusion, blurred vision and some nausea. Pt able to answer questions appropriately, oriented x4. Stroke scale neg for EMS. CBG 118. Took half a pain pill for headache. Denies recent fevers, falls. Moves all extremities appropriately.

## 2019-05-18 NOTE — ED Notes (Signed)
Call Granddaughter at [facility] at [phone]

## 2019-05-18 NOTE — ED Provider Notes (Addendum)
86yo with gradual onset of worst headache of life with possible right peripheral vision change with nausea and confusion which she says is secondary to pain. On Coumadin for afib, no head injury. CTA normal. Getting pain meds now.  Call to neuro, unable to MRI due to pacemaker. Possible complicated migraine. Getting Reglan.  Dr. Leonel Ramsay- call back if symptoms do not improve with meds. If symptoms improves may dc home.  Physical Exam  BP (!) 174/80   Pulse 67   Temp 98.3 F (36.8 C) (Oral)   Resp (!) 22   Ht 5\' 3"  (1.6 m)   Wt 68 kg   SpO2 95%   BMI 26.57 kg/m   Physical Exam  ED Course/Procedures     Procedures  MDM  Symptoms are improving, patient would like to be discharged home at this time, agrees to return to the ER for any worsening symptoms or other concerns.   Spoke with Jovita Gamma, patient's granddaughter, with permission from patient. Reviewed results. Patient is currently on Lovenox, not on Coumadin, in dissipation of injection for her chronic neck pain however injection was canceled due to inclement weather this week.  Patient will continue on her Lovenox through the weekend with call to her provider on Monday for further treatment planning.  Patient will continue with her hydrocodone and Zofran at home as previously prescribed, will contact emergency room if she needs additional medication as granddaughter is not sure how many tablets of each she has available at home.  Discussed CTA of head and neck, unable to have MRI due to pacemaker, if patient develops further symptoms she should return to the emergency room for further evaluation.       Tacy Learn, PA-C 05/18/19 1703    Tacy Learn, PA-C 05/18/19 1749    Davonna Belling, MD 05/19/19 2026

## 2019-05-18 NOTE — ED Notes (Signed)
Patient verbalizes understanding of discharge instructions. Opportunity for questioning and answers were provided. Armband removed by staff, pt discharged from ED by wheelchair. Assisted pt into son's vehicle to transport home

## 2019-05-19 ENCOUNTER — Encounter (HOSPITAL_COMMUNITY): Payer: Self-pay | Admitting: Emergency Medicine

## 2019-05-19 ENCOUNTER — Inpatient Hospital Stay (HOSPITAL_COMMUNITY)
Admission: EM | Admit: 2019-05-19 | Discharge: 2019-05-24 | DRG: 064 | Disposition: A | Discharge status: Skilled Nursing Facility | Payer: Medicare Other | Attending: Neurology | Admitting: Neurology

## 2019-05-19 ENCOUNTER — Inpatient Hospital Stay (HOSPITAL_COMMUNITY): Payer: Medicare Other

## 2019-05-19 ENCOUNTER — Emergency Department (HOSPITAL_COMMUNITY): Payer: Medicare Other

## 2019-05-19 ENCOUNTER — Other Ambulatory Visit: Payer: Self-pay

## 2019-05-19 DIAGNOSIS — R52 Pain, unspecified: Secondary | ICD-10-CM

## 2019-05-19 DIAGNOSIS — F419 Anxiety disorder, unspecified: Secondary | ICD-10-CM | POA: Diagnosis present

## 2019-05-19 DIAGNOSIS — R001 Bradycardia, unspecified: Secondary | ICD-10-CM | POA: Diagnosis present

## 2019-05-19 DIAGNOSIS — R109 Unspecified abdominal pain: Secondary | ICD-10-CM | POA: Diagnosis not present

## 2019-05-19 DIAGNOSIS — M1990 Unspecified osteoarthritis, unspecified site: Secondary | ICD-10-CM | POA: Diagnosis present

## 2019-05-19 DIAGNOSIS — R062 Wheezing: Secondary | ICD-10-CM | POA: Diagnosis not present

## 2019-05-19 DIAGNOSIS — Z7901 Long term (current) use of anticoagulants: Secondary | ICD-10-CM

## 2019-05-19 DIAGNOSIS — K5730 Diverticulosis of large intestine without perforation or abscess without bleeding: Secondary | ICD-10-CM | POA: Diagnosis present

## 2019-05-19 DIAGNOSIS — I611 Nontraumatic intracerebral hemorrhage in hemisphere, cortical: Principal | ICD-10-CM | POA: Diagnosis present

## 2019-05-19 DIAGNOSIS — R531 Weakness: Secondary | ICD-10-CM | POA: Diagnosis present

## 2019-05-19 DIAGNOSIS — I6389 Other cerebral infarction: Secondary | ICD-10-CM | POA: Diagnosis not present

## 2019-05-19 DIAGNOSIS — Z950 Presence of cardiac pacemaker: Secondary | ICD-10-CM | POA: Diagnosis not present

## 2019-05-19 DIAGNOSIS — G936 Cerebral edema: Secondary | ICD-10-CM | POA: Diagnosis present

## 2019-05-19 DIAGNOSIS — K219 Gastro-esophageal reflux disease without esophagitis: Secondary | ICD-10-CM | POA: Diagnosis present

## 2019-05-19 DIAGNOSIS — I482 Chronic atrial fibrillation, unspecified: Secondary | ICD-10-CM | POA: Diagnosis not present

## 2019-05-19 DIAGNOSIS — E876 Hypokalemia: Secondary | ICD-10-CM | POA: Diagnosis present

## 2019-05-19 DIAGNOSIS — H53461 Homonymous bilateral field defects, right side: Secondary | ICD-10-CM | POA: Diagnosis present

## 2019-05-19 DIAGNOSIS — E7800 Pure hypercholesterolemia, unspecified: Secondary | ICD-10-CM | POA: Diagnosis not present

## 2019-05-19 DIAGNOSIS — E0590 Thyrotoxicosis, unspecified without thyrotoxic crisis or storm: Secondary | ICD-10-CM | POA: Diagnosis present

## 2019-05-19 DIAGNOSIS — I10 Essential (primary) hypertension: Secondary | ICD-10-CM | POA: Diagnosis present

## 2019-05-19 DIAGNOSIS — I619 Nontraumatic intracerebral hemorrhage, unspecified: Secondary | ICD-10-CM | POA: Diagnosis present

## 2019-05-19 DIAGNOSIS — I700 Atherosclerosis of aorta: Secondary | ICD-10-CM | POA: Diagnosis present

## 2019-05-19 DIAGNOSIS — K5900 Constipation, unspecified: Secondary | ICD-10-CM | POA: Diagnosis not present

## 2019-05-19 DIAGNOSIS — I161 Hypertensive emergency: Secondary | ICD-10-CM | POA: Diagnosis present

## 2019-05-19 DIAGNOSIS — J90 Pleural effusion, not elsewhere classified: Secondary | ICD-10-CM | POA: Diagnosis present

## 2019-05-19 DIAGNOSIS — Z87891 Personal history of nicotine dependence: Secondary | ICD-10-CM

## 2019-05-19 DIAGNOSIS — Z9049 Acquired absence of other specified parts of digestive tract: Secondary | ICD-10-CM

## 2019-05-19 DIAGNOSIS — I4821 Permanent atrial fibrillation: Secondary | ICD-10-CM | POA: Diagnosis present

## 2019-05-19 DIAGNOSIS — I4891 Unspecified atrial fibrillation: Secondary | ICD-10-CM | POA: Diagnosis present

## 2019-05-19 DIAGNOSIS — Z79899 Other long term (current) drug therapy: Secondary | ICD-10-CM

## 2019-05-19 DIAGNOSIS — Z20822 Contact with and (suspected) exposure to covid-19: Secondary | ICD-10-CM | POA: Diagnosis present

## 2019-05-19 DIAGNOSIS — E871 Hypo-osmolality and hyponatremia: Secondary | ICD-10-CM | POA: Diagnosis present

## 2019-05-19 DIAGNOSIS — Z87442 Personal history of urinary calculi: Secondary | ICD-10-CM

## 2019-05-19 DIAGNOSIS — R1030 Lower abdominal pain, unspecified: Secondary | ICD-10-CM | POA: Diagnosis not present

## 2019-05-19 DIAGNOSIS — Z96641 Presence of right artificial hip joint: Secondary | ICD-10-CM | POA: Diagnosis present

## 2019-05-19 DIAGNOSIS — I615 Nontraumatic intracerebral hemorrhage, intraventricular: Secondary | ICD-10-CM | POA: Diagnosis not present

## 2019-05-19 DIAGNOSIS — Z885 Allergy status to narcotic agent status: Secondary | ICD-10-CM

## 2019-05-19 DIAGNOSIS — D649 Anemia, unspecified: Secondary | ICD-10-CM | POA: Diagnosis present

## 2019-05-19 DIAGNOSIS — Z8673 Personal history of transient ischemic attack (TIA), and cerebral infarction without residual deficits: Secondary | ICD-10-CM

## 2019-05-19 DIAGNOSIS — E785 Hyperlipidemia, unspecified: Secondary | ICD-10-CM | POA: Diagnosis present

## 2019-05-19 DIAGNOSIS — H53149 Visual discomfort, unspecified: Secondary | ICD-10-CM | POA: Diagnosis present

## 2019-05-19 DIAGNOSIS — Z85828 Personal history of other malignant neoplasm of skin: Secondary | ICD-10-CM

## 2019-05-19 LAB — BASIC METABOLIC PANEL
Anion gap: 11 (ref 5–15)
BUN: 8 mg/dL (ref 8–23)
CO2: 24 mmol/L (ref 22–32)
Calcium: 8.9 mg/dL (ref 8.9–10.3)
Chloride: 98 mmol/L (ref 98–111)
Creatinine, Ser: 0.8 mg/dL (ref 0.44–1.00)
GFR calc Af Amer: >60 mL/min (ref >60)
GFR calc non Af Amer: >60 mL/min (ref >60)
Glucose, Bld: 110 mg/dL — ABNORMAL HIGH (ref 70–99)
Potassium: 3.8 mmol/L (ref 3.5–5.1)
Sodium: 133 mmol/L — ABNORMAL LOW (ref 135–145)

## 2019-05-19 LAB — CBC WITH DIFFERENTIAL/PLATELET
Abs Immature Granulocytes: 0 10*3/uL (ref 0.00–0.07)
Basophils Absolute: 0.1 10*3/uL (ref 0.0–0.1)
Basophils Relative: 1 %
Eosinophils Absolute: 0.1 10*3/uL (ref 0.0–0.5)
Eosinophils Relative: 1 %
HCT: 35.3 % — ABNORMAL LOW (ref 36.0–46.0)
Hemoglobin: 11 g/dL — ABNORMAL LOW (ref 12.0–15.0)
Lymphocytes Relative: 11 %
Lymphs Abs: 0.9 10*3/uL (ref 0.7–4.0)
MCH: 25.8 pg — ABNORMAL LOW (ref 26.0–34.0)
MCHC: 31.2 g/dL (ref 30.0–36.0)
MCV: 82.9 fL (ref 80.0–100.0)
Monocytes Absolute: 0.4 10*3/uL (ref 0.1–1.0)
Monocytes Relative: 5 %
Neutro Abs: 6.6 10*3/uL (ref 1.7–7.7)
Neutrophils Relative %: 82 %
Platelets: 300 10*3/uL (ref 150–400)
RBC: 4.26 MIL/uL (ref 3.87–5.11)
RDW: 23.7 % — ABNORMAL HIGH (ref 11.5–15.5)
WBC: 8 10*3/uL (ref 4.0–10.5)
nRBC: 0 % (ref 0.0–0.2)
nRBC: 0 /100 WBC

## 2019-05-19 LAB — SEDIMENTATION RATE: Sed Rate: 13 mm/hr (ref 0–22)

## 2019-05-19 LAB — C-REACTIVE PROTEIN: CRP: 0.5 mg/dL (ref <1.0)

## 2019-05-19 MED ORDER — IRBESARTAN 300 MG PO TABS
300.0000 mg | ORAL_TABLET | Freq: Every morning | ORAL | Status: DC
  Administered 2019-05-20 – 2019-05-24 (×5): 300 mg via ORAL
  Filled 2019-05-19 (×2): qty 1
  Filled 2019-05-19 (×3): qty 2

## 2019-05-19 MED ORDER — SENNOSIDES-DOCUSATE SODIUM 8.6-50 MG PO TABS
1.0000 | ORAL_TABLET | Freq: Two times a day (BID) | ORAL | Status: DC
  Administered 2019-05-20 (×3): 1 via ORAL
  Filled 2019-05-19 (×4): qty 1

## 2019-05-19 MED ORDER — ACETAMINOPHEN 650 MG RE SUPP: 650.0000 mg | RECTAL | Status: DC | PRN

## 2019-05-19 MED ORDER — METHIMAZOLE 5 MG PO TABS
5.0000 mg | ORAL_TABLET | Freq: Three times a day (TID) | ORAL | Status: DC
  Administered 2019-05-20 – 2019-05-24 (×14): 5 mg via ORAL
  Filled 2019-05-19 (×18): qty 1

## 2019-05-19 MED ORDER — DIPHENHYDRAMINE HCL 50 MG/ML IJ SOLN: 25.0000 mg | Freq: Once | INTRAMUSCULAR | Status: DC

## 2019-05-19 MED ORDER — CLEVIDIPINE BUTYRATE 0.5 MG/ML IV EMUL
0.0000 mg/h | INTRAVENOUS | Status: DC
  Administered 2019-05-20 (×2): 4 mg/h via INTRAVENOUS
  Administered 2019-05-20: 1 mg/h via INTRAVENOUS
  Administered 2019-05-20: 21:00:00 8 mg/h via INTRAVENOUS
  Administered 2019-05-20: 17:00:00 4 mg/h via INTRAVENOUS
  Administered 2019-05-21: 06:00:00 6 mg/h via INTRAVENOUS
  Administered 2019-05-21: 12:00:00 12 mg/h via INTRAVENOUS
  Administered 2019-05-21 (×2): 21 mg/h via INTRAVENOUS
  Administered 2019-05-21: 19 mg/h via INTRAVENOUS
  Administered 2019-05-21: 9 mg/h via INTRAVENOUS
  Administered 2019-05-21: 10:00:00 6 mg/h via INTRAVENOUS
  Administered 2019-05-21: 22:00:00 8 mg/h via INTRAVENOUS
  Filled 2019-05-19: qty 50
  Filled 2019-05-19: qty 100
  Filled 2019-05-19 (×6): qty 50
  Filled 2019-05-19: qty 100

## 2019-05-19 MED ORDER — STROKE: EARLY STAGES OF RECOVERY BOOK
Freq: Once | Status: DC
  Filled 2019-05-19: qty 1

## 2019-05-19 MED ORDER — PANTOPRAZOLE SODIUM 40 MG IV SOLR
40.0000 mg | Freq: Every day | INTRAVENOUS | Status: DC
  Administered 2019-05-20: 40 mg via INTRAVENOUS
  Filled 2019-05-19: qty 40

## 2019-05-19 MED ORDER — ACETAMINOPHEN 325 MG PO TABS
650.0000 mg | ORAL_TABLET | ORAL | Status: DC | PRN
  Administered 2019-05-20 – 2019-05-24 (×4): 650 mg via ORAL
  Filled 2019-05-19 (×4): qty 2

## 2019-05-19 MED ORDER — PROCHLORPERAZINE EDISYLATE 10 MG/2ML IJ SOLN
10.0000 mg | Freq: Once | INTRAMUSCULAR | Status: AC
  Administered 2019-05-22: 10 mg via INTRAVENOUS
  Filled 2019-05-19: qty 2

## 2019-05-19 MED ORDER — FENTANYL CITRATE (PF) 100 MCG/2ML IJ SOLN
25.0000 ug | Freq: Once | INTRAMUSCULAR | Status: AC
  Administered 2019-05-19: 23:00:00 25 ug via INTRAVENOUS
  Filled 2019-05-19: qty 2

## 2019-05-19 MED ORDER — ACETAMINOPHEN 160 MG/5ML PO SOLN: 650.0000 mg | ORAL | Status: DC | PRN

## 2019-05-19 MED ORDER — CLONAZEPAM 0.5 MG PO TABS
0.5000 mg | ORAL_TABLET | Freq: Every evening | ORAL | Status: DC | PRN
  Administered 2019-05-21 – 2019-05-22 (×2): 0.5 mg via ORAL
  Filled 2019-05-19 (×2): qty 1

## 2019-05-19 MED ORDER — AMLODIPINE BESYLATE 10 MG PO TABS
10.0000 mg | ORAL_TABLET | Freq: Every morning | ORAL | Status: DC
  Administered 2019-05-20 – 2019-05-24 (×5): 10 mg via ORAL
  Filled 2019-05-19 (×5): qty 1

## 2019-05-19 MED ORDER — LABETALOL HCL 5 MG/ML IV SOLN
20.0000 mg | Freq: Once | INTRAVENOUS | Status: AC
  Administered 2019-05-20: 20 mg via INTRAVENOUS
  Filled 2019-05-19: qty 4

## 2019-05-19 MED ORDER — FUROSEMIDE 40 MG PO TABS
40.0000 mg | ORAL_TABLET | Freq: Every day | ORAL | Status: DC
  Administered 2019-05-20 – 2019-05-24 (×5): 40 mg via ORAL
  Filled 2019-05-19 (×5): qty 1

## 2019-05-19 NOTE — ED Triage Notes (Signed)
Pt arrived from home with EMS stating that pt was here yesterday with a headache, r/o stroke-- headache worse today-- family insistant that she gets admitted today

## 2019-05-19 NOTE — ED Provider Notes (Signed)
[facility] EMERGENCY DEPARTMENT Provider Note   CSN: [id number] Arrival date & time: 05/19/19  1802     History Chief Complaint  Patient presents with  . Headache  . Stroke Symptoms    [name] is a 86 y.o. female.  The history is provided by the patient and medical records.  The patient is a 86 year old female with a past medical history of atrial fibrillation, pacemaker, hypertension, hyperlipidemia, who presents to the ED for headache and confusion.  Patient was seen here yesterday, had a CTA head which was read as not having any acute abnormalities, the team spoke with neurology who recommended treatment as possible migraine and discharge if headache improves.  Per notes the patient's headache improved and the patient was discharged home.  Patient reports that her headache returned today with visual changes and confusion so the patient was brought back to the emergency department.  On arrival the patient is awake and alert, but is confused and is a difficult historian.  She reports her headache is bitemporal, constant, worsening, nothing makes it better or worse.  She denies jaw claudication, she endorses vision changes, no neck pain or stiffness, no fevers, chills, no nausea, vomiting.     Past Medical History:  Diagnosis Date  . Anxiety   . Atrial fibrillation ([place])   . Bradycardia   . Cancer ([place])    skin  . Cataract    removed bilat with lens both eyes   . Complication of anesthesia   . Difficulty sleeping   . Dysrhythmia    A FIB  . GERD (gastroesophageal reflux disease)   . H/O hiatal hernia   . History of kidney stones   . History of skin cancer   . History of transfusion   . Hyperlipidemia   . Hypertension   . Osteoarthritis   . Pacemaker   . Pneumonia   . PONV (postoperative nausea and vomiting)   . Shortness of breath    WITH EXERTION    Patient Active Problem List   Diagnosis Date Noted  . V tach ([place]) 08/09/2018  .  Intractable nausea and vomiting 08/09/2018  . Hyponatremia 08/09/2018  . Hypomagnesemia 08/09/2018  . Hyperthyroidism 08/09/2018  . Epigastric pain   . Nonspecific chest pain 07/18/2018  . Anxiety 07/18/2018  . Insomnia 07/18/2018  . Complication of anesthesia   . Intractable headache 01/06/2017  . Intractable pain 01/04/2017  . Chronic anticoagulation 01/03/2017  . Neck pain 01/03/2017  . Acute bronchitis 06/08/2016  . Fall 09/24/2015  . Dizziness 09/24/2015  . Postoperative anemia due to acute blood loss 12/27/2013  . Postop Transfusion 12/27/2013  . OA (osteoarthritis) of hip 12/24/2013  . Preop cardiovascular exam 09/25/2013  . Cardiac pacemaker in situ 08/29/2008  . Essential hypertension 08/21/2008  . ATRIAL FIBRILLATION 08/21/2008  . Bradycardia 08/21/2008  . PNEUMONIA 08/21/2008  . GERD 08/21/2008  . NEPHROLITHIASIS 08/21/2008  . Osteoarthritis 08/21/2008    Past Surgical History:  Procedure Laterality Date  . APPENDECTOMY  1960  . ARTHROPLASTY  1994   Left total knee  . ARTHROPLASTY  2000   Total right knee  . BACK SURGERY    . CATARACT EXTRACTION, BILATERAL     with lens implants  . CHOLECYSTECTOMY    . COLONOSCOPY    . DG SELECTED HSG GDC ONLY  2002   Dilation  . Microdiskectomy  05/2004   Left L4-L5  . PACEMAKER INSERTION    . POLYPECTOMY    .  TOTAL ABDOMINAL HYSTERECTOMY  1969  . TOTAL HIP ARTHROPLASTY Right 12/24/2013   Procedure: RIGHT TOTAL HIP ARTHROPLASTY ANTERIOR APPROACH;  Surgeon: Gearlean Alf, MD;  Location: [facility] ORS;  Service: Orthopedics;  Laterality: Right;  . UPPER GASTROINTESTINAL ENDOSCOPY       OB History   No obstetric history on file.     Family History  Problem Relation Age of Onset  . Heart disease Mother   . Leukemia Father   . Colon cancer Neg Hx   . Esophageal cancer Neg Hx   . Stomach cancer Neg Hx   . Pancreatic cancer Neg Hx   . Liver disease Neg Hx   . Colon polyps Neg Hx     Social History   Tobacco Use   . Smoking status: Former Smoker    Quit date: 12/18/1973    Years since quitting: 45.4  . Smokeless tobacco: Never Used  . Tobacco comment: Smoker for 25 years and quit at around age 79  Substance Use Topics  . Alcohol use: No  . Drug use: No    Home Medications Prior to Admission medications   Medication Sig Start Date End Date Taking? Authorizing Provider  acetaminophen (TYLENOL) 325 MG tablet Take 650 mg by mouth every 6 (six) hours as needed for mild pain.     [provider]  amLODipine (NORVASC) 10 MG tablet Take 10 mg by mouth every morning.     [provider]  clonazePAM (KLONOPIN) 0.5 MG tablet Take 0.5 mg by mouth at bedtime as needed for anxiety.  03/18/15   [provider]  docusate sodium (COLACE) 100 MG capsule Take 100 mg by mouth daily as needed for mild constipation.    [provider]  enoxaparin (LOVENOX) 80 MG/0.8ML injection Inject 70 mg into the skin every 12 (twelve) hours.  12/22/18   [provider]  feeding supplement, ENSURE ENLIVE, (ENSURE ENLIVE) LIQD Take 237 mLs by mouth 2 (two) times daily between meals. 08/11/18   Mercy Riding, MD  ferrous sulfate 325 (65 FE) MG tablet Take 325 mg by mouth 2 (two) times daily. 04/18/19   [provider]  furosemide (LASIX) 20 MG tablet Take 2 tablets (40 mg total) by mouth daily. 04/02/19 07/01/19  Evans Lance, MD  HYDROcodone-acetaminophen (NORCO) 5-325 MG tablet Take 0.5 tablets by mouth every 6 (six) hours as needed for moderate pain.     [provider]  irbesartan (AVAPRO) 300 MG tablet Take 300 mg by mouth every morning.    [provider]  methimazole (TAPAZOLE) 5 MG tablet Take 5 mg by mouth 3 (three) times daily.    [provider]  omeprazole (PRILOSEC) 20 MG capsule TAKE 1 CAPSULE (20 MG TOTAL) BY MOUTH DAILY. TAKE 30-60 MIN BEFORE DINNER 02/19/19   Levin Erp, PA  ondansetron (ZOFRAN) 4 MG tablet Take 2 mg by mouth every 8  (eight) hours as needed for nausea or vomiting.     [provider]  potassium chloride SA (K-DUR) 20 MEQ tablet Take 20 mEq by mouth 2 (two) times daily. 04/29/18   [provider]  warfarin (COUMADIN) 2 MG tablet Take 1-2 tablets (2-4 mg total) by mouth See admin instructions. Take 4 mg by mouth on MWF and 2 mg all other days 07/19/18   Geradine Girt, DO    Allergies    Morphine and related  Review of Systems   Review of Systems  Unable to perform  ROS: Mental status change  Constitutional: Negative for chills and fever.  Musculoskeletal: Negative for neck pain and neck stiffness.  Neurological: Positive for headaches. Negative for weakness and numbness.  Psychiatric/Behavioral: Positive for confusion.    Physical Exam Updated Vital Signs BP (!) 118/57 (BP Location: Right Arm)   Pulse 62   Temp 98.7 F (37.1 C) (Oral)   Resp 18   SpO2 95%   Physical Exam Vitals and nursing note reviewed.  Constitutional:      Appearance: She is well-developed. She is not toxic-appearing.  HENT:     Head: Normocephalic and atraumatic.  Eyes:     General: Visual field deficit (visual field deficit to temporal right eye only) present.     Extraocular Movements: Extraocular movements intact.     Conjunctiva/sclera: Conjunctivae normal.     Pupils: Pupils are equal, round, and reactive to light.  Cardiovascular:     Rate and Rhythm: Normal rate and regular rhythm.     Heart sounds: Normal heart sounds. No murmur.  Pulmonary:     Effort: Pulmonary effort is normal. No respiratory distress.     Breath sounds: Normal breath sounds.  Abdominal:     Palpations: Abdomen is soft.     Tenderness: There is no abdominal tenderness.  Musculoskeletal:     Cervical back: Neck supple. No rigidity.  Skin:    General: Skin is warm and dry.  Neurological:     Mental Status: She is alert. She is disoriented and confused.     GCS: GCS eye subscore is 4. GCS verbal subscore is 4. GCS  motor subscore is 6.     Cranial Nerves: No facial asymmetry.     Sensory: No sensory deficit.     Motor: No pronator drift.     Deep Tendon Reflexes: Reflexes are normal and symmetric.     Comments: Oriented to person only  Psychiatric:        Mood and Affect: Mood normal.        Behavior: Behavior normal.     ED Results / Procedures / Treatments   Labs (all labs ordered are listed, but only abnormal results are displayed) Labs Reviewed  CBC WITH DIFFERENTIAL/PLATELET - Abnormal; Notable for the following components:      Result Value   Hemoglobin 11.0 (*)    HCT 35.3 (*)    MCH 25.8 (*)    RDW 23.7 (*)    All other components within normal limits  BASIC METABOLIC PANEL - Abnormal; Notable for the following components:   Sodium 133 (*)    Glucose, Bld 110 (*)    All other components within normal limits  SARS CORONAVIRUS 2 (TAT 6-24 HRS)  SEDIMENTATION RATE  C-REACTIVE PROTEIN  HEMOGLOBIN A1C  URINALYSIS, ROUTINE W REFLEX MICROSCOPIC  PROTIME-INR    EKG None  Radiology CT Head Wo Contrast (Final result) Result time 05/19/19 22:31:16 Final result by Mahala Menghini, MD (05/19/19 22:31:16)           Narrative:   CLINICAL DATA: Headache. Evaluated yesterday, interval worsening.   EXAM:  CT HEAD WITHOUT CONTRAST   TECHNIQUE:  Contiguous axial images were obtained from the base of the skull  through the vertex without intravenous contrast.   COMPARISON: CT and CTA 05/18/2019   FINDINGS:  Brain: Intraparenchymal hemorrhage in the left occipital lobe  measures 2.9 x 2.5 x 2.3 cm (volume = 8.7 cm^3). Retrospectively  this was present on exam yesterday and measured 2.3  x 1.6 x 2.1 cm  at that time, slight interval increase on the current exam. There is  mild surrounding edema. No midline shift. No evidence of  subarachnoid or subdural hemorrhage. Brain volume is normal for age.  Mild chronic small vessel ischemia. No hydrocephalus.   Vascular:  Atherosclerosis of skullbase vasculature without  hyperdense vessel or abnormal calcification.   Skull: No fracture or focal lesion.   Sinuses/Orbits: Paranasal sinuses and mastoid air cells are clear.  The visualized orbits are unremarkable. Bilateral cataract  resection.   Other: None.   IMPRESSION:  Left occipital intraparenchymal hemorrhage, 8.7cc volume, slightly  increased in size from exam yesterday. No midline shift.   Critical Value/emergent results were called by telephone at the time  of interpretation on 05/19/2019 at 10:30 pm to Dr [place] Delrico Minehart, who  verbally acknowledged these results.    Electronically Signed  By: Keith Rake M.D.  On: 05/19/2019 22:31          Procedures Procedures (including critical care time)  Medications Ordered in ED Medications  prochlorperazine (COMPAZINE) injection 10 mg (10 mg Intravenous Not Given 05/19/19 2025)  diphenhydrAMINE (BENADRYL) injection 25 mg (25 mg Intravenous Not Given 05/19/19 2025)    ED Course  I have reviewed the triage vital signs and the nursing notes.  Pertinent labs & imaging results that were available during my care of the patient were reviewed by me and considered in my medical decision making (see chart for details).    MDM Rules/Calculators/A&P                      Differential includes migraine headache, tension headache, intracranial bleed, temporal arteritis.  Seen here yesterday for new onset headache.  CTA head read yesterday is without evidence of acute intracranial abnormality, the patient was treated with Reglan and Benadryl and Toradol and had improvement of her headache and was discharged home.  Review of the CTA shows concern for left occipital intraparenchymal hemorrhage.  Repeat CT head here shows slightly increased size of intraparenchymal hemorrhage without midline shift.  Neurology Dr. [name] consulted for evaluation of intraparenchymal hemorrhage.  ESR and CRP  unremarkable, do not suspect temporal arteritis.  Headache and altered mental status as well as vision changes likely secondary to intraparenchymal hemorrhage.  Admitted to Dr. [name] in stable condition.  Final Clinical Impression(s) / ED Diagnoses Final diagnoses:  Intraparenchymal hemorrhage of brain [facility])    Rx / [place] Orders ED Discharge Orders    None       Michaelene Dutan, [place], MD 05/19/19 2345    Elnora Morrison, MD 05/19/19 2354

## 2019-05-19 NOTE — H&P (Addendum)
Admission H&P    Chief Complaint: Severe headache  HPI: [name] is an 86 y.o. female with atrial fibrillation on Coumadin who re-presents with severe headache. She initially presented to the ED yesterday with severe headache and possible right sided vision loss. Per EDP note: "[id number] with gradual onset of worst headache of life with possible right peripheral vision change with nausea and confusion which she says is secondary to pain. On Coumadin for afib, no head injury. CTA normal. Getting pain meds now."  A CTA of head and neck was obtained which was reported as negative for hemorrhage on the official report; however, on review of the images from yesterday's scan on her re-presentation today, it is apparent that there was a left occipital lobe hemorrhage at that time.   On interview with the patient this evening, she endorses severe headache to the extent that she frequently perseverates on this when asked other questions and when given instructions during exam.   Repeat CT head obtained this evening again shows a medium sized left occipital lobe hemorrhage with a small amount of adjacent vasogenic edema (preliminary review by Neurologist).   Past Medical History:  Diagnosis Date  . Anxiety   . Atrial fibrillation ([place])   . Bradycardia   . Cancer ([place])    skin  . Cataract    removed bilat with lens both eyes   . Complication of anesthesia   . Difficulty sleeping   . Dysrhythmia    A FIB  . GERD (gastroesophageal reflux disease)   . H/O hiatal hernia   . History of kidney stones   . History of skin cancer   . History of transfusion   . Hyperlipidemia   . Hypertension   . Osteoarthritis   . Pacemaker   . Pneumonia   . PONV (postoperative nausea and vomiting)   . Shortness of breath    WITH EXERTION    Past Surgical History:  Procedure Laterality Date  . APPENDECTOMY  1960  . ARTHROPLASTY  1994   Left total knee  . ARTHROPLASTY  2000   Total right knee  . BACK  SURGERY    . CATARACT EXTRACTION, BILATERAL     with lens implants  . CHOLECYSTECTOMY    . COLONOSCOPY    . DG SELECTED HSG GDC ONLY  2002   Dilation  . Microdiskectomy  05/2004   Left L4-L5  . PACEMAKER INSERTION    . POLYPECTOMY    . TOTAL ABDOMINAL HYSTERECTOMY  1969  . TOTAL HIP ARTHROPLASTY Right 12/24/2013   Procedure: RIGHT TOTAL HIP ARTHROPLASTY ANTERIOR APPROACH;  Surgeon: Gearlean Alf, MD;  Location: [facility];  Service: Orthopedics;  Laterality: Right;  . UPPER GASTROINTESTINAL ENDOSCOPY      Family History  Problem Relation Age of Onset  . Heart disease Mother   . Leukemia Father   . Colon cancer Neg Hx   . Esophageal cancer Neg Hx   . Stomach cancer Neg Hx   . Pancreatic cancer Neg Hx   . Liver disease Neg Hx   . Colon polyps Neg Hx    Social History:  reports that she quit smoking about 45 years ago. She has never used smokeless tobacco. She reports that she does not drink alcohol or use drugs.  Allergies:  Allergies  Allergen Reactions  . Morphine And Related Nausea And Vomiting   Outpatient Medications: No current facility-administered medications on file prior to encounter.   Current Outpatient Medications on File  Prior to Encounter  Medication Sig Dispense Refill  . acetaminophen (TYLENOL) 325 MG tablet Take 650 mg by mouth every 6 (six) hours as needed for mild pain.     Marland Kitchen amLODipine (NORVASC) 10 MG tablet Take 10 mg by mouth every morning.     . clonazePAM (KLONOPIN) 0.5 MG tablet Take 0.5 mg by mouth at bedtime as needed for anxiety.     . docusate sodium (COLACE) 100 MG capsule Take 100 mg by mouth daily as needed for mild constipation.    . enoxaparin (LOVENOX) 80 MG/0.8ML injection Inject 70 mg into the skin every 12 (twelve) hours.     . feeding supplement, ENSURE ENLIVE, (ENSURE ENLIVE) LIQD Take 237 mLs by mouth 2 (two) times daily between meals. 60 Bottle 0  . ferrous sulfate 325 (65 FE) MG tablet Take 325 mg by mouth 2 (two) times daily.    .  furosemide (LASIX) 20 MG tablet Take 2 tablets (40 mg total) by mouth daily. 180 tablet 3  . HYDROcodone-acetaminophen (NORCO) 5-325 MG tablet Take 0.5 tablets by mouth every 6 (six) hours as needed for moderate pain.     Marland Kitchen irbesartan (AVAPRO) 300 MG tablet Take 300 mg by mouth every morning.    . methimazole (TAPAZOLE) 5 MG tablet Take 5 mg by mouth 3 (three) times daily.    Marland Kitchen omeprazole (PRILOSEC) 20 MG capsule TAKE 1 CAPSULE (20 MG TOTAL) BY MOUTH DAILY. TAKE 30-60 MIN BEFORE DINNER 90 capsule 3  . ondansetron (ZOFRAN) 4 MG tablet Take 2 mg by mouth every 8 (eight) hours as needed for nausea or vomiting.     . potassium chloride SA (K-DUR) 20 MEQ tablet Take 20 mEq by mouth 2 (two) times daily.    Marland Kitchen warfarin (COUMADIN) 2 MG tablet Take 1-2 tablets (2-4 mg total) by mouth See admin instructions. Take 4 mg by mouth on MWF and 2 mg all other days       ROS: Unable to obtain detailed ROS in the context of severe headache which results in perseverative responses to questions.   Physical Examination: Blood pressure (!) 164/78, pulse 66, temperature 98.5 F (36.9 C), temperature source Oral, resp. rate 20, SpO2 99 %.  HEENT-  Adrian/AT Lungs - Respirations unlabored Extremities - Warm and well perfused  Neurologic Examination: Mental Status: Awake with decreased level of alertness. Confused and mildly agitated in the context of pain. Perseverates frequently regarding severe headache pain. Will perseverate with some motor commands as well. Speech fluent in the context of sparse verbal output. Will follow simple motor commands and answer simple questions. Partially oriented to place and time. Keeps eyes closed for most of exam.    Cranial Nerves: II:  Right homonymous hemianopsia. PERRL.  III,IV, VI: EOMI on assessment limited by headache pain. No nystagmus.  V,VII: No facial droop. Does not answer when testing for temp sensation.  VIII: hearing intact to voice IX,X: No hypophonia XI: Head is  midline XII: Midline tongue   Motor: Movement is limited by confusion and headache pain. In this context BUE 5/5 with 5/5 knee extension, ADF and APF bilaterally.  Sensory: Does not answer questions when testing for temp sensation. Reacts to tactile stimuli equally x 4.  Deep Tendon Reflexes:  2+ right biceps and brachioradialis. 1+ left biceps and brachioradialis. 2+ patellae bilaterally.  Plantars: Right: Upgoing   Left: Upgoing Cerebellar: No ataxia with limited FNF testing bilaterally  Gait: Deferred  Results for orders placed or performed during  the hospital encounter of 05/18/19 (from the past 48 hour(s))  CBC with Differential     Status: Abnormal   Collection Time: 05/18/19 12:27 PM  Result Value Ref Range   WBC 6.9 4.0 - 10.5 K/uL   RBC 4.55 3.87 - 5.11 MIL/uL   Hemoglobin 11.9 (L) 12.0 - 15.0 g/dL   HCT 38.3 36.0 - 46.0 %   MCV 84.2 80.0 - 100.0 fL   MCH 26.2 26.0 - 34.0 pg   MCHC 31.1 30.0 - 36.0 g/dL   RDW 23.9 (H) 11.5 - 15.5 %   Platelets 301 150 - 400 K/uL   nRBC 0.0 0.0 - 0.2 %   Neutrophils Relative % 82 %   Neutro Abs 5.6 1.7 - 7.7 K/uL   Lymphocytes Relative 10 %   Lymphs Abs 0.7 0.7 - 4.0 K/uL   Monocytes Relative 7 %   Monocytes Absolute 0.5 0.1 - 1.0 K/uL   Eosinophils Relative 1 %   Eosinophils Absolute 0.1 0.0 - 0.5 K/uL   Basophils Relative 0 %   Basophils Absolute 0.0 0.0 - 0.1 K/uL   Immature Granulocytes 0 %   Abs Immature Granulocytes 0.02 0.00 - 0.07 K/uL    Comment: Performed at [facility], [address], [place], [place] [id number]  Basic metabolic panel     Status: Abnormal   Collection Time: 05/18/19 12:27 PM  Result Value Ref Range   Sodium 134 (L) 135 - 145 mmol/L   Potassium 3.8 3.5 - 5.1 mmol/L   Chloride 96 (L) 98 - 111 mmol/L   CO2 27 22 - 32 mmol/L   Glucose, Bld 119 (H) 70 - 99 mg/dL   BUN <5 (L) 8 - 23 mg/dL   Creatinine, Ser 0.78 0.44 - 1.00 mg/dL   Calcium 9.3 8.9 - 10.3 mg/dL   GFR calc non Af Amer >60 >60 mL/min    GFR calc Af Amer >60 >60 mL/min   Anion gap 11 5 - 15    Comment: Performed at [facility], [place] [address] [place], [place] [address]  Protime-INR     Status: None   Collection Time: 05/18/19 12:27 PM  Result Value Ref Range   Prothrombin Time 13.0 11.4 - 15.2 seconds   INR 1.0 0.8 - 1.2    Comment: (NOTE) INR goal varies based on device and disease states. Performed at [facility], [place] [address] [place], [place] [address]    CT Angio Head W or [place] Contrast  Result Date: 05/18/2019 CLINICAL DATA:  Subarachnoid hemorrhage suspected. New onset headache beginning last night the coming worse today. Confusion and blurred vision. EXAM: CT ANGIOGRAPHY HEAD AND NECK TECHNIQUE: Multidetector CT imaging of the head and neck was performed using the standard protocol during bolus administration of intravenous contrast. Multiplanar CT image reconstructions and MIPs were obtained to evaluate the vascular anatomy. Carotid stenosis measurements (when applicable) are obtained utilizing NASCET criteria, using the distal internal carotid diameter as the denominator. CONTRAST:  32mL OMNIPAQUE IOHEXOL 350 MG/ML SOLN COMPARISON:  CT of the cervical spine 10/26/2018. CT head without contrast 03/27/18. CT angiogram of the head and neck 01/03/2017. FINDINGS: CT HEAD FINDINGS Brain: No acute infarct, hemorrhage, or mass lesion is present. Mild white matter changes are stable. The ventricles are of normal size. No significant extraaxial fluid collection is present. No subarachnoid hemorrhage is present. The brainstem and cerebellum are within normal limits. Vascular: Atherosclerotic changes are noted within the cavernous internal  carotid arteries bilaterally. No hyperdense vessel is present. Skull: Calvarium is intact. No focal lytic or blastic lesions are present. No significant extracranial soft tissue lesion is present. Sinuses: The paranasal sinuses and mastoid air cells are clear. Bilateral lens  replacements are noted. Orbits: Bilateral lens replacements are noted. Globes and orbits are otherwise unremarkable. Review of the MIP images confirms the above findings CTA NECK FINDINGS Aortic arch: A 3 vessel arch configuration is present. Atherosclerotic changes are noted at the origin of the left subclavian artery without significant stenosis in the more distal aorta without aneurysm. Right carotid system: The right common carotid artery is within normal limits. Atherosclerotic changes are noted at the bifurcation. Moderate tortuosity is present without a significant stenosis relative to the more distal vessel. Left carotid system: The left common carotid artery is within normal limits. Atherosclerotic calcifications are present at the bifurcation without significant stenosis. Moderate tortuosity is present in the proximal left ICA without a significant stenosis relative to the more distal vessels. Vertebral arteries: The vertebral arteries are codominant. Both vertebral arteries originate from the subclavian arteries without significant stenosis. There is no significant stenosis of either vertebral artery in the neck. Skeleton: Multilevel degenerative changes are again seen within the cervical spine. Grade 1 degenerative anterolisthesis at C3-4 and C4-5 stable. Chronic loss of endplate height is evident C5-6 and C6-7. Other neck: Prominent thyroid goiter is again noted. The thyroid is diffusely enlarged and heterogeneous. No dominant nodule is present. No followup recommended (ref: J Am Coll Radiol. 2015 Feb;12(2): 143-50). Upper chest: The lung apices are clear. Thoracic inlet is within normal limits. Review of the MIP images confirms the above findings CTA HEAD FINDINGS Anterior circulation: Atherosclerotic calcifications are present in the cavernous internal carotid arteries without significant stenosis relative to the ICA terminus. No aneurysm is present. The A1 and M1 segments are normal. The anterior  communicating artery is patent. The ACA and MCA branch vessels are within normal limits. Posterior circulation: The vertebral arteries are codominant. PICA origins are visualized and normal. The basilar artery is normal. Both posterior cerebral arteries originate from basilar tip. A right posterior communicating artery is patent. The PCA branch vessels are within normal limits bilaterally. Venous sinuses: The dural sinuses are patent. The straight sinus deep cerebral veins are intact. Cortical veins are unremarkable. Anatomic variants: None Review of the MIP images confirms the above findings IMPRESSION: 1. No subarachnoid hemorrhage. Normal CT appearance of the noncontrast head. 2. Moderate tortuosity of the cervical internal carotid arteries bilaterally without significant stenosis relative to the more distal vessels. 3. Mild bilateral carotid bifurcation atherosclerotic changes without significant stenosis. 4. Additional atherosclerotic changes at the aortic arch and within the cavernous internal carotid arteries without significant stenosis. 5. Multilevel spondylosis of the cervical spine. 6. Aortic Atherosclerosis (ICD10-I70.0). Electronically Signed   By: San Morelle M.D.   On: 05/18/2019 14:40   CT Angio Neck W and/or [place] Contrast  Result Date: 05/18/2019 CLINICAL DATA:  Subarachnoid hemorrhage suspected. New onset headache beginning last night the coming worse today. Confusion and blurred vision. EXAM: CT ANGIOGRAPHY HEAD AND NECK TECHNIQUE: Multidetector CT imaging of the head and neck was performed using the standard protocol during bolus administration of intravenous contrast. Multiplanar CT image reconstructions and MIPs were obtained to evaluate the vascular anatomy. Carotid stenosis measurements (when applicable) are obtained utilizing NASCET criteria, using the distal internal carotid diameter as the denominator. CONTRAST:  41mL OMNIPAQUE IOHEXOL 350 MG/ML SOLN COMPARISON:  CT of the  cervical spine 10/26/2018. CT head without contrast 03/27/18. CT angiogram of the head and neck 01/03/2017. FINDINGS: CT HEAD FINDINGS Brain: No acute infarct, hemorrhage, or mass lesion is present. Mild white matter changes are stable. The ventricles are of normal size. No significant extraaxial fluid collection is present. No subarachnoid hemorrhage is present. The brainstem and cerebellum are within normal limits. Vascular: Atherosclerotic changes are noted within the cavernous internal carotid arteries bilaterally. No hyperdense vessel is present. Skull: Calvarium is intact. No focal lytic or blastic lesions are present. No significant extracranial soft tissue lesion is present. Sinuses: The paranasal sinuses and mastoid air cells are clear. Bilateral lens replacements are noted. Orbits: Bilateral lens replacements are noted. Globes and orbits are otherwise unremarkable. Review of the MIP images confirms the above findings CTA NECK FINDINGS Aortic arch: A 3 vessel arch configuration is present. Atherosclerotic changes are noted at the origin of the left subclavian artery without significant stenosis in the more distal aorta without aneurysm. Right carotid system: The right common carotid artery is within normal limits. Atherosclerotic changes are noted at the bifurcation. Moderate tortuosity is present without a significant stenosis relative to the more distal vessel. Left carotid system: The left common carotid artery is within normal limits. Atherosclerotic calcifications are present at the bifurcation without significant stenosis. Moderate tortuosity is present in the proximal left ICA without a significant stenosis relative to the more distal vessels. Vertebral arteries: The vertebral arteries are codominant. Both vertebral arteries originate from the subclavian arteries without significant stenosis. There is no significant stenosis of either vertebral artery in the neck. Skeleton: Multilevel degenerative  changes are again seen within the cervical spine. Grade 1 degenerative anterolisthesis at C3-4 and C4-5 stable. Chronic loss of endplate height is evident C5-6 and C6-7. Other neck: Prominent thyroid goiter is again noted. The thyroid is diffusely enlarged and heterogeneous. No dominant nodule is present. No followup recommended (ref: J Am Coll Radiol. 2015 Feb;12(2): 143-50). Upper chest: The lung apices are clear. Thoracic inlet is within normal limits. Review of the MIP images confirms the above findings CTA HEAD FINDINGS Anterior circulation: Atherosclerotic calcifications are present in the cavernous internal carotid arteries without significant stenosis relative to the ICA terminus. No aneurysm is present. The A1 and M1 segments are normal. The anterior communicating artery is patent. The ACA and MCA branch vessels are within normal limits. Posterior circulation: The vertebral arteries are codominant. PICA origins are visualized and normal. The basilar artery is normal. Both posterior cerebral arteries originate from basilar tip. A right posterior communicating artery is patent. The PCA branch vessels are within normal limits bilaterally. Venous sinuses: The dural sinuses are patent. The straight sinus deep cerebral veins are intact. Cortical veins are unremarkable. Anatomic variants: None Review of the MIP images confirms the above findings IMPRESSION: 1. No subarachnoid hemorrhage. Normal CT appearance of the noncontrast head. 2. Moderate tortuosity of the cervical internal carotid arteries bilaterally without significant stenosis relative to the more distal vessels. 3. Mild bilateral carotid bifurcation atherosclerotic changes without significant stenosis. 4. Additional atherosclerotic changes at the aortic arch and within the cavernous internal carotid arteries without significant stenosis. 5. Multilevel spondylosis of the cervical spine. 6. Aortic Atherosclerosis (ICD10-I70.0). Electronically Signed   By:  San Morelle M.D.   On: 05/18/2019 14:40    Assessment: 86 y.o. female re-presenting with severe headache secondary to subacute left occipital lobe hemorrhage.  1. CTA head/neck from yesterday is reviewed by Neurohospitalist service, revealing a left occipital lobe ICH  not mentioned on the official report. No aneurysm was seen. Atherosclerotic changes were noted.   2. Neurological exam reveals right homonymous hemianopsia and confusion with agitation in the setting of severe headache.  3. Has pacemaker. Cannot obtain MRI.  4. Atrial fibrillation, on Coumadin at home prior to yesterday's ED visit. Was switched to Lovenox per report but not known if she was able to have injections prior to her re-presentation this visit.   Plan: 1. Admit to ICU under Neurology service 2. MRI of head 3. TTE 4. PT consult, OT consult, Speech consult 5. Cardiac telemetry 6. Frequent neuro checks 7. HgbA1c, fasting lipid panel 8. Stopping home Lovenox injections. Given ICH while on warfarin, it should be stopped indefinitely. If hemorrhage resorbing with no complications at 2-4 weeks, can consider starting ASA for stroke prevention in the setting of her atrial fibrillation.  9. No antiplatelet medications or anticoagulants. DVT prophylaxis with SCDs 10. BP management with PRN labetalol and clevidipine drip. SBP goal < 140.  11. Protamine to be administered. Appreciate Pharmacy assistance.  12. Will hold off on administering K-Centra for now as INR yesterday was 1.0 and is still stable at 1.0 today.  13. Repeat CT head in 24 hours 14. Fentanyl for severe headache in association with ICH. A trial dose of 25 mcg IV fentanyl has been ordered.  15. Plain films of right hip and pelvis for symptom of 10/10 groin pain. Patient states that she fell at home. Second dose of fentanyl 25 mcg is being administered.   Electronically signed: Dr. Kerney Elbe 05/19/2019, 8:14 PM

## 2019-05-19 NOTE — ED Notes (Addendum)
Pt's son, Conception Macneill, called pt room and asked to be notified with any updates going forward. His number is [phone].

## 2019-05-20 ENCOUNTER — Inpatient Hospital Stay (HOSPITAL_COMMUNITY): Payer: Medicare Other

## 2019-05-20 DIAGNOSIS — E871 Hypo-osmolality and hyponatremia: Secondary | ICD-10-CM

## 2019-05-20 DIAGNOSIS — I6389 Other cerebral infarction: Secondary | ICD-10-CM

## 2019-05-20 DIAGNOSIS — I161 Hypertensive emergency: Secondary | ICD-10-CM

## 2019-05-20 DIAGNOSIS — I482 Chronic atrial fibrillation, unspecified: Secondary | ICD-10-CM

## 2019-05-20 DIAGNOSIS — Z950 Presence of cardiac pacemaker: Secondary | ICD-10-CM

## 2019-05-20 LAB — ECHOCARDIOGRAM COMPLETE

## 2019-05-20 LAB — HEMOGLOBIN A1C
Hgb A1c MFr Bld: 5.2 % (ref 4.8–5.6)
Mean Plasma Glucose: 102.54 mg/dL

## 2019-05-20 LAB — PROTIME-INR
INR: 1 (ref 0.8–1.2)
Prothrombin Time: 13.1 seconds (ref 11.4–15.2)

## 2019-05-20 LAB — SARS CORONAVIRUS 2 (TAT 6-24 HRS): SARS Coronavirus 2: NEGATIVE

## 2019-05-20 LAB — MRSA PCR SCREENING: MRSA by PCR: NEGATIVE

## 2019-05-20 MED ORDER — PROTAMINE SULFATE 10 MG/ML IV SOLN
50.0000 mg | INTRAVENOUS | Status: AC
  Administered 2019-05-20: 02:00:00 50 mg via INTRAVENOUS
  Filled 2019-05-20: qty 5

## 2019-05-20 MED ORDER — HYDRALAZINE HCL 25 MG PO TABS
25.0000 mg | ORAL_TABLET | Freq: Three times a day (TID) | ORAL | Status: DC
  Administered 2019-05-20 – 2019-05-21 (×3): 25 mg via ORAL
  Filled 2019-05-20 (×3): qty 1

## 2019-05-20 MED ORDER — ONDANSETRON HCL 4 MG/2ML IJ SOLN
INTRAMUSCULAR | Status: AC
Start: 2019-05-20 — End: 2019-05-20
  Filled 2019-05-20: qty 2

## 2019-05-20 MED ORDER — CHLORHEXIDINE GLUCONATE CLOTH 2 % EX PADS
6.0000 | MEDICATED_PAD | Freq: Every day | CUTANEOUS | Status: DC
  Administered 2019-05-21 – 2019-05-22 (×2): 6 via TOPICAL

## 2019-05-20 MED ORDER — SODIUM CHLORIDE 1 G PO TABS
2.0000 g | ORAL_TABLET | Freq: Two times a day (BID) | ORAL | Status: DC
  Administered 2019-05-20 – 2019-05-24 (×8): 2 g via ORAL
  Filled 2019-05-20 (×9): qty 2

## 2019-05-20 MED ORDER — PANTOPRAZOLE SODIUM 40 MG PO TBEC
40.0000 mg | DELAYED_RELEASE_TABLET | Freq: Every day | ORAL | Status: DC
  Administered 2019-05-20 – 2019-05-23 (×4): 40 mg via ORAL
  Filled 2019-05-20 (×4): qty 1

## 2019-05-20 MED ORDER — FENTANYL CITRATE (PF) 100 MCG/2ML IJ SOLN
25.0000 ug | Freq: Once | INTRAMUSCULAR | Status: AC
  Administered 2019-05-21: 25 ug via INTRAVENOUS
  Filled 2019-05-20: qty 2

## 2019-05-20 MED ORDER — ONDANSETRON HCL 4 MG/2ML IJ SOLN
4.0000 mg | Freq: Four times a day (QID) | INTRAMUSCULAR | Status: DC | PRN
  Administered 2019-05-20 – 2019-05-21 (×2): 4 mg via INTRAVENOUS
  Filled 2019-05-20: qty 2

## 2019-05-20 NOTE — ED Notes (Signed)
Paged Dr [name] to RN Joe--[name]

## 2019-05-20 NOTE — Progress Notes (Signed)
PHARMACIST NOTE   Pharmacy was contacted by neurologist Dr [name] re: [name] reversal of Coumadin for ICH.  INR 2/19 was 1.0; repeat stat INR 1.0.  Upon further chart review while awaiting repeat INR it was determined that pt had been on Lovenox bridge while awaiting "neck injection" with ortho; those notes are unavailable in Epic, though the Lovenox was active on med review 2/19.  Pt is unable to give med hx so last dose is unclear.  Discussed w/ Dr [name]; agree to reverse LMWH with protamine at this time; will give max dose of 50mg  given Lovenox dose 80mg  SQ BID with wt 68kg and CrCl 86ml/min.  Wynona Neat, PharmD, BCPS 05/20/2019 12:36 AM

## 2019-05-20 NOTE — Progress Notes (Signed)
*  PRELIMINARY RESULTS* Echocardiogram 2D Echocardiogram has been performed.  [name] 05/20/2019, 2:44 PM

## 2019-05-20 NOTE — Progress Notes (Signed)
Patient complained of 10/10 right groin pain; physician notified, DG pelvis ordered and patient transported down. Patient is now returned to room and is resting comfortably, no further complaints of right groin pain. Will continue to monitor.

## 2019-05-20 NOTE — Progress Notes (Signed)
Pt complaining of nausea. Dr. Erlinda Hong put in order for zofran and changed routine repeat CT to STAT.  Patient transported to and from CT without incident.

## 2019-05-20 NOTE — Progress Notes (Signed)
OT Cancellation Note  Patient Details Name: [name] MRN: [id number] DOB: Feb 12, 1926   Cancelled Treatment:    Reason Eval/Treat Not Completed: Active bedrest order. Will return as schedule allows and pt medically ready.   C-Road, OTR/L Acute Rehab Pager: [phone] Office: [phone] 05/20/2019, 7:48 AM

## 2019-05-20 NOTE — Progress Notes (Signed)
RN observed patient heart rate drop to 34 bpm on monitor with no pacer spike for a few seconds. Notified [place], printed off a monitor strip of the event and placed it in patient's chart. Patient is resting comfortably with heart rate in the 60s, no acute change in mental status. Will continue to monitor.

## 2019-05-20 NOTE — Progress Notes (Signed)
STROKE TEAM PROGRESS NOTE   INTERVAL HISTORY Her son is at the bedside.  Pt lying in bed, denies any HA, still has mild perseveration, anomia, right hemianopia, not orientated to time. Moving all extremities. Not able to have MRI due to pacer. Will repeat CT head in pm. Still on cleviprex.    OBJECTIVE Vitals:   05/20/19 0430 05/20/19 0445 05/20/19 0500 05/20/19 0515  BP: (!) 128/57 (!) 115/56 (!) 130/53 (!) 127/55  Pulse: (!) 59 (!) 58 (!) 58 60  Resp: 14 13 13 14   Temp:      TempSrc:      SpO2: 96% 95% 95% 97%    CBC:  Recent Labs  Lab 05/18/19 1227 05/19/19 2010  WBC 6.9 8.0  NEUTROABS 5.6 6.6  HGB 11.9* 11.0*  HCT 38.3 35.3*  MCV 84.2 82.9  PLT 301 [id number]    Basic Metabolic Panel:  Recent Labs  Lab 05/18/19 1227 05/19/19 2010  NA 134* 133*  K 3.8 3.8  CL 96* 98  CO2 27 24  GLUCOSE 119* 110*  BUN <5* 8  CREATININE 0.78 0.80  CALCIUM 9.3 8.9    Lipid Panel:     Component Value Date/Time   CHOL 177 07/19/2018 0021   TRIG 60 07/19/2018 0021   HDL 76 07/19/2018 0021   CHOLHDL 2.3 07/19/2018 0021   VLDL 12 07/19/2018 0021   LDLCALC 89 07/19/2018 0021   HgbA1c:  Lab Results  Component Value Date   HGBA1C 5.2 05/19/2019   Urine Drug Screen: No results found for: LABOPIA, COCAINSCRNUR, LABBENZ, AMPHETMU, THCU, LABBARB  Alcohol Level No results found for: ETH  IMAGING  CT Angio Head W or Wo Contrast CT Angio Neck W and/or Wo Contrast 05/18/2019 IMPRESSION:  1. No subarachnoid hemorrhage. Normal CT appearance of the noncontrast head.  2. Moderate tortuosity of the cervical internal carotid arteries bilaterally without significant stenosis relative to the more distal vessels.  3. Mild bilateral carotid bifurcation atherosclerotic changes without significant stenosis.  4. Additional atherosclerotic changes at the aortic arch and within the cavernous internal carotid arteries without significant stenosis.  5. Multilevel spondylosis of the cervical spine.   6. Aortic Atherosclerosis (ICD10-I70.0).   Chest 2 View 05/19/2019 IMPRESSION:  1. Bibasilar airspace disease favored to represent atelectasis. Small bilateral pleural effusions.  2. Cardiomegaly with mild vascular congestion.  3. Left-sided pacemaker.   CT Head Wo Contrast 05/19/2019 IMPRESSION:  Left occipital intraparenchymal hemorrhage, 8.7cc volume, slightly increased in size from exam yesterday. No midline shift.   CT Head WO Contrast  05/20/19 1. Left occipital intra-axial hemorrhage has increased by 1-2 mL since yesterday, now about 3.4 cm (11 mL). But regional edema and mild regional mass effect are stable. 2. And there is trace new intraventricular extension into the left occipital horn. But ventricle size remains normal.  DG HIP UNILAT WITH PELVIS 2-3 VIEWS RIGHT 05/20/2019 IMPRESSION:  Good appearance following previous right hip arthroplasty. No traumatic finding.   Transthoracic Echocardiogram  1. Left ventricular ejection fraction, by estimation, is 60 to 65%. The  left ventricle has normal function. The left ventricle has no regional  wall motion abnormalities. Left ventricular diastolic parameters are  indeterminate in the setting of atrial  fibrillation.  2. Right ventricular systolic function is normal. The right ventricular  size is normal. There is moderately elevated pulmonary artery systolic  pressure. The estimated right ventricular systolic pressure is 123XX123 mmHg.  3. Left atrial size was severely dilated.  4. Right atrial  size was moderately dilated.  5. The mitral valve is degenerative. Trivial mitral valve regurgitation.  6. The aortic valve is tricuspid. Aortic valve regurgitation is not  visualized.  7. The inferior vena cava is dilated in size with >50% respiratory  variability, suggesting right atrial pressure of 8 mmHg.    ECG - atrial fibrillation - ventricular response 64 BPM intermittent pacing (See cardiology reading for complete  details)   PHYSICAL EXAM  Temp:  [97.5 F (36.4 C)-98.7 F (37.1 C)] 98.5 F (36.9 C) (02/21 0800) Pulse Rate:  [57-67] 59 (02/21 0900) Resp:  [12-21] 15 (02/21 0900) BP: (101-185)/(51-93) 135/59 (02/21 0900) SpO2:  [92 %-100 %] 97 % (02/21 0900)  General - Well nourished, well developed, in no apparent distress.  Ophthalmologic - fundi not visualized due to noncooperation.  Cardiovascular - irregularly irregular heart rate and rhythm.  Neuro - awake alert, orientated to self, people, age but not orientated to time. Has difficulty name the place, but when give choices, she knows she is in hospital. However, pt does have significant anomia, able to repeat simple sentence. No aphasia, mild dysarthria. Also mild perseveration with answering. Right hemianopia, right mild facial droop, tongue midline. PERRL, EOMI. Moving all extremities symmetrically. Sensation and coordination intact, gait not tested.     ASSESSMENT/PLAN [name] is a 86 y.o. female with history of Hld, Htn, PPM (bradycardia), atrial fibrillation on Lovenox PTA who re-presents with severe headache found to have a medium sized left occipital lobe hemorrhage with a small amount of adjacent vasogenic edema . She did not receive IV t-PA due to hemorrhage.   Stroke:  left occipital lobe ICH in the setting of lovenox for atrial fibrillation and hypertension  CT head 2/19 - Left occipital intraparenchymal hemorrhage  CTA H&N 2/19 - No aneurysm or AVM  CT Head 2/20 - increased size at left occipital ICH  repeat 05/20/19 - minimal increase of left occipital ICH with trace IVH  MRI head - not able to perform due to pacemaker  2D Echo EF 60-65%  [employer] Virus 2 - negative  LDL - 89  HgbA1c - 5.2  VTE prophylaxis - SCDs  Lovenox prior to admission, now on No antithrombotic.   Ongoing aggressive stroke risk factor management  Therapy recommendations:  pending  Disposition:  Pending  Chronic  afib  Was on Coumadin until Monday, INR 2.5  Due to planned cervical injection on Thursday, patient Coumadin switched to Lovenox twice daily dosing  However, Thursday cervical injection canceled due to inclement weather  Patient continued on Lovenox therapeutic dose and to Friday when headache started.  Currently on no antithrombotics due to [place]  Hypertensive emergency  Home BP meds: Avapro ; Norvasc ; Lasix  Current BP meds: Avapro ; Norvasc ; Lasix  Added hydralazine 25 every 8h  Strict BP control with SBP goal < 140 mmHg  Taper off Cleviprex as able . Long-term BP goal normotensive  Hyperlipidemia  Home Lipid lowering medication: none   LDL 89, goal < 70  Current lipid lowering medication: none - contraindicated with ICH  May consider statin at discharge  Other Stroke Risk Factors  Advanced age  Former cigarette smoker - quit   Other Active Problems  Code status - Full Code  Hyponatremia - 133 - on salt tab  Bradycardia [facility]  Hospital day # 1  This patient is critically ill due to [place], A. fib, hypertensive emergency and at significant risk of neurological worsening,  death form hematoma expansion, IVH, hydrocephalus, heart failure, encephalopathy. This patient's care requires constant monitoring of vital signs, hemodynamics, respiratory and cardiac monitoring, review of multiple databases, neurological assessment, discussion with family, other specialists and medical decision making of high complexity. I spent 35 minutes of neurocritical care time in the care of this patient. I had long discussion with son at bedside, updated pt current condition, treatment plan and potential prognosis, and answered all the questions. He expressed understanding and appreciation.   Rosalin Hawking, MD PhD Stroke Neurology 05/20/2019 5:58 PM    To contact Stroke Continuity provider, please refer to [email or web address] After hours, contact General Neurology

## 2019-05-20 NOTE — Progress Notes (Signed)
PT Cancellation Note  Patient Details Name: [name] MRN: [id number] DOB: 02-Dec-1925   Cancelled Treatment:    Reason Eval/Treat Not Completed: Active bedrest order   Roney Marion, PT  Acute Rehabilitation Services Pager [phone] Office [phone]    [name] 05/20/2019, 7:40 AM

## 2019-05-21 ENCOUNTER — Other Ambulatory Visit: Payer: Self-pay | Admitting: *Deleted

## 2019-05-21 ENCOUNTER — Inpatient Hospital Stay (HOSPITAL_COMMUNITY): Payer: Medicare Other

## 2019-05-21 DIAGNOSIS — R109 Unspecified abdominal pain: Secondary | ICD-10-CM | POA: Diagnosis not present

## 2019-05-21 LAB — COMPREHENSIVE METABOLIC PANEL
ALT: 22 U/L (ref 0–44)
AST: 26 U/L (ref 15–41)
Albumin: 3.4 g/dL — ABNORMAL LOW (ref 3.5–5.0)
Alkaline Phosphatase: 84 U/L (ref 38–126)
Anion gap: 10 (ref 5–15)
BUN: 12 mg/dL (ref 8–23)
CO2: 23 mmol/L (ref 22–32)
Calcium: 8.6 mg/dL — ABNORMAL LOW (ref 8.9–10.3)
Chloride: 94 mmol/L — ABNORMAL LOW (ref 98–111)
Creatinine, Ser: 0.91 mg/dL (ref 0.44–1.00)
GFR calc Af Amer: >60 mL/min (ref >60)
GFR calc non Af Amer: 55 mL/min — ABNORMAL LOW (ref >60)
Glucose, Bld: 110 mg/dL — ABNORMAL HIGH (ref 70–99)
Potassium: 4.3 mmol/L (ref 3.5–5.1)
Sodium: 127 mmol/L — ABNORMAL LOW (ref 135–145)
Total Bilirubin: 0.8 mg/dL (ref 0.3–1.2)
Total Protein: 6 g/dL — ABNORMAL LOW (ref 6.5–8.1)

## 2019-05-21 LAB — BASIC METABOLIC PANEL
Anion gap: 10 (ref 5–15)
BUN: 10 mg/dL (ref 8–23)
CO2: 27 mmol/L (ref 22–32)
Calcium: 8.6 mg/dL — ABNORMAL LOW (ref 8.9–10.3)
Chloride: 94 mmol/L — ABNORMAL LOW (ref 98–111)
Creatinine, Ser: 0.91 mg/dL (ref 0.44–1.00)
GFR calc Af Amer: >60 mL/min (ref >60)
GFR calc non Af Amer: 55 mL/min — ABNORMAL LOW (ref >60)
Glucose, Bld: 99 mg/dL (ref 70–99)
Potassium: 3.4 mmol/L — ABNORMAL LOW (ref 3.5–5.1)
Sodium: 131 mmol/L — ABNORMAL LOW (ref 135–145)

## 2019-05-21 LAB — CBC
HCT: 30.9 % — ABNORMAL LOW (ref 36.0–46.0)
HCT: 34.5 % — ABNORMAL LOW (ref 36.0–46.0)
Hemoglobin: 9.9 g/dL — ABNORMAL LOW (ref 12.0–15.0)
Hemoglobin: 11.2 g/dL — ABNORMAL LOW (ref 12.0–15.0)
MCH: 26.7 pg (ref 26.0–34.0)
MCH: 26.2 pg (ref 26.0–34.0)
MCHC: 32 g/dL (ref 30.0–36.0)
MCHC: 32.5 g/dL (ref 30.0–36.0)
MCV: 83.3 fL (ref 80.0–100.0)
MCV: 80.6 fL (ref 80.0–100.0)
Platelets: 255 10*3/uL (ref 150–400)
Platelets: 264 10*3/uL (ref 150–400)
RBC: 3.71 MIL/uL — ABNORMAL LOW (ref 3.87–5.11)
RBC: 4.28 MIL/uL (ref 3.87–5.11)
RDW: 23.6 % — ABNORMAL HIGH (ref 11.5–15.5)
RDW: 23.6 % — ABNORMAL HIGH (ref 11.5–15.5)
WBC: 7.9 10*3/uL (ref 4.0–10.5)
WBC: 10.2 10*3/uL (ref 4.0–10.5)
nRBC: 0 % (ref 0.0–0.2)
nRBC: 0 % (ref 0.0–0.2)

## 2019-05-21 LAB — LIPID PANEL
Cholesterol: 193 mg/dL (ref 0–200)
HDL: 59 mg/dL (ref >40)
LDL Cholesterol: 126 mg/dL — ABNORMAL HIGH (ref 0–99)
Total CHOL/HDL Ratio: 3.3 RATIO
Triglycerides: 40 mg/dL (ref <150)
VLDL: 8 mg/dL (ref 0–40)

## 2019-05-21 LAB — TROPONIN I (HIGH SENSITIVITY)
Troponin I (High Sensitivity): 11 ng/L (ref <18)
Troponin I (High Sensitivity): 11 ng/L (ref <18)

## 2019-05-21 LAB — LACTIC ACID, PLASMA
Lactic Acid, Venous: 1.9 mmol/L (ref 0.5–1.9)
Lactic Acid, Venous: 1.3 mmol/L (ref 0.5–1.9)
Lactic Acid, Venous: 1 mmol/L (ref 0.5–1.9)

## 2019-05-21 LAB — TRIGLYCERIDES: Triglycerides: 135 mg/dL (ref <150)

## 2019-05-21 MED ORDER — IOHEXOL 350 MG/ML SOLN
100.0000 mL | Freq: Once | INTRAVENOUS | Status: AC | PRN
  Administered 2019-05-21: 23:00:00 100 mL via INTRAVENOUS

## 2019-05-21 MED ORDER — ACETAMINOPHEN 10 MG/ML IV SOLN
1000.0000 mg | Freq: Once | INTRAVENOUS | Status: AC
  Administered 2019-05-21: 13:00:00 1000 mg via INTRAVENOUS
  Filled 2019-05-21: qty 100

## 2019-05-21 MED ORDER — FLEET ENEMA 7-19 GM/118ML RE ENEM
1.0000 | ENEMA | Freq: Once | RECTAL | Status: AC
  Administered 2019-05-21: 17:00:00 1 via RECTAL
  Filled 2019-05-21: qty 1

## 2019-05-21 MED ORDER — IOHEXOL 300 MG/ML  SOLN
100.0000 mL | Freq: Once | INTRAMUSCULAR | Status: AC | PRN
  Administered 2019-05-21: 14:00:00 100 mL via INTRAVENOUS

## 2019-05-21 MED ORDER — HYDRALAZINE HCL 50 MG PO TABS
50.0000 mg | ORAL_TABLET | Freq: Three times a day (TID) | ORAL | Status: DC
  Administered 2019-05-21 – 2019-05-22 (×3): 50 mg via ORAL
  Filled 2019-05-21 (×3): qty 1

## 2019-05-21 MED ORDER — SODIUM CHLORIDE 0.9 % IV SOLN: INTRAVENOUS | Status: DC

## 2019-05-21 MED ORDER — MORPHINE SULFATE (PF) 2 MG/ML IV SOLN
INTRAVENOUS | Status: AC
  Administered 2019-05-21: 1 mg via INTRAVENOUS
  Filled 2019-05-21: qty 1

## 2019-05-21 MED ORDER — POTASSIUM CHLORIDE CRYS ER 20 MEQ PO TBCR
40.0000 meq | EXTENDED_RELEASE_TABLET | ORAL | Status: AC
  Administered 2019-05-21 (×2): 40 meq via ORAL
  Filled 2019-05-21 (×2): qty 2

## 2019-05-21 MED ORDER — MORPHINE SULFATE (PF) 2 MG/ML IV SOLN: 1.0000 mg | INTRAVENOUS | Status: DC | PRN

## 2019-05-21 NOTE — Progress Notes (Signed)
STROKE TEAM PROGRESS NOTE   INTERVAL HISTORY RN at bedside.  Patient ate well for breakfast, however had couple of small bowel movement and then complained of abdominal pain.  On exam, abdomen soft, tenderness to touch at middle lower abdomen.  Bladder scan less than 100 cc.  CT abdomen pelvis no acute finding.  Likely due to stool/gas impaction, will do Fleet enema.  CT head is stable, neuro stable.  OBJECTIVE Vitals:   05/21/19 0645 05/21/19 0700 05/21/19 0758 05/21/19 0800  BP: (!) 127/53 (!) 133/52  138/68  Pulse: 60 (!) 59  62  Resp: 13 18  17   Temp:   98.4 F (36.9 C)   TempSrc:      SpO2: 100% 99%  92%    CBC:  Recent Labs  Lab 05/18/19 1227 05/18/19 1227 05/19/19 2010 05/21/19 0417  WBC 6.9   < > 8.0 7.9  NEUTROABS 5.6  --  6.6  --   HGB 11.9*   < > 11.0* 9.9*  HCT 38.3   < > 35.3* 30.9*  MCV 84.2   < > 82.9 83.3  PLT 301   < > 300 255   < > = values in this interval not displayed.    Basic Metabolic Panel:  Recent Labs  Lab 05/19/19 2010 05/21/19 0417  NA 133* 131*  K 3.8 3.4*  CL 98 94*  CO2 24 27  GLUCOSE 110* 99  BUN 8 10  CREATININE 0.80 0.91  CALCIUM 8.9 8.6*    Lipid Panel:     Component Value Date/Time   CHOL 193 05/21/2019 0417   TRIG 40 05/21/2019 0417   HDL 59 05/21/2019 0417   CHOLHDL 3.3 05/21/2019 0417   VLDL 8 05/21/2019 0417   LDLCALC 126 (H) 05/21/2019 0417   HgbA1c:  Lab Results  Component Value Date   HGBA1C 5.2 05/19/2019   Urine Drug Screen: No results found for: LABOPIA, COCAINSCRNUR, LABBENZ, AMPHETMU, THCU, LABBARB  Alcohol Level No results found for: ETH  IMAGING  CT Angio Head W or Wo Contrast CT Angio Neck W and/or Wo Contrast 05/18/2019 IMPRESSION:  1. No subarachnoid hemorrhage. Normal CT appearance of the noncontrast head.  2. Moderate tortuosity of the cervical internal carotid arteries bilaterally without significant stenosis relative to the more distal vessels.  3. Mild bilateral carotid bifurcation  atherosclerotic changes without significant stenosis.  4. Additional atherosclerotic changes at the aortic arch and within the cavernous internal carotid arteries without significant stenosis.  5. Multilevel spondylosis of the cervical spine.  6. Aortic Atherosclerosis (ICD10-I70.0).   Chest 2 View 05/19/2019 IMPRESSION:  1. Bibasilar airspace disease favored to represent atelectasis. Small bilateral pleural effusions.  2. Cardiomegaly with mild vascular congestion.  3. Left-sided pacemaker.   CT Head Wo Contrast 05/19/2019 IMPRESSION:  Left occipital intraparenchymal hemorrhage, 8.7cc volume, slightly increased in size from exam yesterday. No midline shift.   CT Head WO Contrast  05/20/19 1. Left occipital intra-axial hemorrhage has increased by 1-2 mL since yesterday, now about 3.4 cm (11 mL). But regional edema and mild regional mass effect are stable. 2. And there is trace new intraventricular extension into the left occipital horn. But ventricle size remains normal.  CT Head WO Contrast  05/21/19 Stable hematoma left occipital lobe. Trace extension into the left occipital horn slightly improved. No hydrocephalus. No new hemorrhage  DG HIP UNILAT WITH PELVIS 2-3 VIEWS RIGHT 05/20/2019 IMPRESSION:  Good appearance following previous right hip arthroplasty. No traumatic finding.  Transthoracic Echocardiogram  1. Left ventricular ejection fraction, by estimation, is 60 to 65%. The left ventricle has normal function. The left ventricle has no regional wall motion abnormalities. Left ventricular diastolic parameters are indeterminate in the setting of atrial fibrillation.  2. Right ventricular systolic function is normal. The right ventricular size is normal. There is moderately elevated pulmonary artery systolic pressure. The estimated right ventricular systolic pressure is 123XX123 mmHg.  3. Left atrial size was severely dilated.  4. Right atrial size was moderately dilated.  5. The  mitral valve is degenerative. Trivial mitral valve regurgitation.  6. The aortic valve is tricuspid. Aortic valve regurgitation is not visualized.  7. The inferior vena cava is dilated in size with >50% respiratory variability, suggesting right atrial pressure of 8 mmHg.   ECG - atrial fibrillation - ventricular response 64 BPM intermittent pacing (See cardiology reading for complete details)   PHYSICAL EXAM  Temp:  [97.8 F (36.6 C)-99.8 F (37.7 C)] 98.4 F (36.9 C) (02/22 0758) Pulse Rate:  [59-72] 62 (02/22 0800) Resp:  [9-31] 17 (02/22 0800) BP: (102-177)/(39-77) 138/68 (02/22 0800) SpO2:  [90 %-100 %] 92 % (02/22 0800)  General - Well nourished, well developed, acute distress due to abdominal pain.  Ophthalmologic - fundi not visualized due to noncooperation.  Cardiovascular - irregularly irregular heart rate and rhythm.  Abdomen - soft, tenderness at medial lower abdomen, no rebound tenderness  Neuro - awake alert, mildly agitated due to pain, orientated to self, people, age but not orientated to time.  Still has difficulty name the place, but able to name 3/4, able to repeat simple sentence with dysarthric voice. No aphasia, mild to moderate dysarthria. Right hemianopia, right mild facial droop, tongue midline. PERRL, EOMI. Moving all extremities symmetrically. Sensation and coordination intact, gait not tested.    ASSESSMENT/PLAN [name] is a 86 y.o. female with history of Hld, Htn, PPM (bradycardia), atrial fibrillation on Lovenox PTA who re-presents with severe headache found to have a medium sized left occipital lobe hemorrhage with a small amount of adjacent vasogenic edema . She did not receive IV t-PA due to hemorrhage.   Stroke:  left occipital lobe ICH in the setting of lovenox for atrial fibrillation and hypertension  CT head 2/19 - Left occipital intraparenchymal hemorrhage  CTA H&N 2/19 - No aneurysm or AVM  CT Head 2/20 - increased size at  left occipital ICH  CT head repeat 05/20/19 - minimal increase of left occipital ICH with trace IVH  CT head repeat 2/22 stable hematoma no significant IVH  2D Echo EF 60-65%  [employer] Virus 2 - negative  LDL - 89  HgbA1c - 5.2  VTE prophylaxis - SCDs  Lovenox prior to admission, now on No antithrombotic. - received protamine for reversal on admission  Ongoing aggressive stroke risk factor management  Therapy recommendations:  SNF  Disposition:  Pending  Abdominal pain  Middle lower abdomen tenderness  Bladder scan negative  CT abdomen pelvis no acute finding  Concerning for stool/gas impaction  Fleet enema ordered  Chronic afib  Was on Coumadin until Monday, INR 2.5  Due to planned cervical injection on Thursday, patient Coumadin switched to Lovenox twice daily dosing  However, Thursday cervical injection canceled due to inclement weather  Patient continued on Lovenox therapeutic dose and to Friday when headache started.  Currently on no antithrombotics due to [place]  Hypertensive emergency  Home BP meds: Avapro ; Norvasc ; Lasix  Current BP meds:  Avapro ; Norvasc ; Lasix  Add hydralazine 25->50 every 8h  Strict BP control with SBP goal < 160 mmHg  Taper off leviprex as able . Long-term BP goal normotensive  Hyperlipidemia  Home Lipid lowering medication: none   LDL 89, goal < 70  May consider statin at discharge  Hyponatremia  Na 133->131 - on salt tab  Start IVF  Likely due to salt wasting   Other Stroke Risk Factors  Advanced age  Former cigarette smoker - quit   Other Active Problems  Code status - Full Code  Bradycardia - PPM  Anemia Hgb 11.0->9.9  Hypokalemia 3.4 -> supplement   Hospital day # 2  This patient is critically ill due to [place], A. fib, hypertensive emergency, abdominal pain and at significant risk of neurological worsening, death form hematoma expansion, IVH, hydrocephalus, heart failure, encephalopathy.  This patient's care requires constant monitoring of vital signs, hemodynamics, respiratory and cardiac monitoring, review of multiple databases, neurological assessment, discussion with family, other specialists and medical decision making of high complexity. I spent 45 minutes of neurocritical care time in the care of this patient. I have discussed with Dr. Lamonte Sakai CCM.  Rosalin Hawking, MD PhD Stroke Neurology 05/21/2019 11:21 AM    To contact Stroke Continuity provider, please refer to [email or web address] After hours, contact General Neurology

## 2019-05-21 NOTE — Progress Notes (Addendum)
On midnight assessment RN found faint ecchymosis on mid anterior forehead in patient's hairline. This RN had admitted patient to ICU 05/20/19 at 0137h and did not find this ecchymosis during her skin assessments, but patient had mentioned to RN that she had fallen at some point before coming to the hospital and that she thought she had hurt her hip. RN found no prior documentation/reports of patient fall in chart. Patient reported she did not hit her head. Physician was notified, DG pelvis ordered and completed. RN has reassessed patient and patient still reports she does not remember hitting her head before coming to the hospital. RN will continue to monitor patient.

## 2019-05-21 NOTE — Patient Outreach (Signed)
[facility]) Care Management  05/21/2019  [name] 1926-03-28 [id number]   RN Health Coach discipline closure due to patient admitted into hospital. Patient will be followed by Complex Care Management once discharged.   Plan: RN  Will send PCP discipline closure letter  [place] [facility] [phone]

## 2019-05-21 NOTE — Progress Notes (Signed)
Complaining of severe abdominal pain. Enema today did not relieve the pain, which she states is 10/10 and making her feel "like I'm ready to go to [place]". CT abdomen and pelvis today did not reveal an etiology for her pain.   CT abdomen and pelvis: 1. No acute findings to explain the patient's pain. 2. Hyperdense lesions in both kidneys cannot be characterized as simple cysts. Lesion on the right appears minimally larger. If further evaluation is desired, CT or MR without and with contrast could be performed. 3. Isolated and nonspecific right paramidline omental nodule, unchanged. 4.  Aortic atherosclerosis (ICD10-I70.0).  Consulting Hospitalist service for assistance in determining the etiology for her intractable abdominal pain.   Electronically signed: Dr. Kerney Elbe

## 2019-05-21 NOTE — Progress Notes (Signed)
PT Cancellation Note  Patient Details Name: [name] MRN: [id number] DOB: Nov 27, 1925   Cancelled Treatment:    Reason Eval/Treat Not Completed: Medical issues which prohibited therapy.  Sudden abdominal pain, stat CT of abdomen ordered, but not yet completed.  RN recommended holding until results back.  PT will check back later today or tomorrow as time allows.   Thanks,  Verdene Lennert, PT, DPT  Acute Rehabilitation [phone] pager [phone] office  @ [facility]: [phone]     Harvie Heck 05/21/2019, 12:34 PM

## 2019-05-21 NOTE — Consult Note (Signed)
[name]  [name] DOB: 1925/10/14 DOA: 05/19/2019 PCP: [name], MD   Requesting physician: Dr. [name] Date of consultation: 05/21/2019 Reason for consultation: Abdominal pain  Chief Complaint: Abdominal pain  HPI: Patient is a 86 year old female with a past medical history of A. fib on Coumadin, bradycardia status post PPM, GERD, nephrolithiasis, hypertension, hyperlipidemia, history of appendectomy and cholecystectomy admitted under neurology service to the ICU for severe headache secondary to left occipital lobe intracranial hemorrhage in the setting of A. fib on anticoagulation and hypertensive emergency.  Anticoagulation was stopped.  Patient complained of acute onset severe abdominal pain this morning.  CT abdomen pelvis was ordered and revealed no acute findings to explain the patient's pain.  Showing hyperdense lesions in both kidneys, cannot be characterized as simple cysts.  Labs done this afternoon showing normal high-sensitivity troponin level.  Normal lactic acid level.  Labs done earlier this morning showing no leukocytosis.  Hemoglobin 9.9, baseline in the 10-11 range.  Her abdominal pain was thought to be due to stool/gas impaction and patient was given Fleet enema but continued to complain of severe abdominal pain.  TRH consulted for management of abdominal pain.  Patient reports having severe 10 out of 10 intensity, constant periumbilical/suprapubic abdominal pain.  Reports feeling nauseous.  Appears uncomfortable and no additional history could be obtained from her.  Per nursing staff report, patient had breakfast this morning and soon after started complaining of severe periumbilical abdominal pain.  She was given Fleet enema and had a large bowel movement.  She has continued to complain of nausea and severe abdominal pain throughout the day.  No episodes of vomiting.  Review of Systems: All systems reviewed and apart  from history of presenting illness, are negative.  Past Medical History:  Diagnosis Date  . Anxiety   . Atrial fibrillation ([place])   . Bradycardia   . Cancer ([place])    skin  . Cataract    removed bilat with lens both eyes   . Complication of anesthesia   . Difficulty sleeping   . Dysrhythmia    A FIB  . GERD (gastroesophageal reflux disease)   . H/O hiatal hernia   . History of kidney stones   . History of skin cancer   . History of transfusion   . Hyperlipidemia   . Hypertension   . Osteoarthritis   . Pacemaker   . Pneumonia   . PONV (postoperative nausea and vomiting)   . Shortness of breath    WITH EXERTION   Past Surgical History:  Procedure Laterality Date  . APPENDECTOMY  1960  . ARTHROPLASTY  1994   Left total knee  . ARTHROPLASTY  2000   Total right knee  . BACK SURGERY    . CATARACT EXTRACTION, BILATERAL     with lens implants  . CHOLECYSTECTOMY    . COLONOSCOPY    . DG SELECTED HSG GDC ONLY  2002   Dilation  . Microdiskectomy  05/2004   Left L4-L5  . PACEMAKER INSERTION    . POLYPECTOMY    . TOTAL ABDOMINAL HYSTERECTOMY  1969  . TOTAL HIP ARTHROPLASTY Right 12/24/2013   Procedure: RIGHT TOTAL HIP ARTHROPLASTY ANTERIOR APPROACH;  Surgeon: Gearlean Alf, MD;  Location: [facility];  Service: Orthopedics;  Laterality: Right;  . UPPER GASTROINTESTINAL ENDOSCOPY     Social History:  reports that she quit smoking about 45 years ago. She has never used smokeless tobacco. She reports that  she does not drink alcohol or use drugs.  Allergies  Allergen Reactions  . Morphine And Related Nausea And Vomiting   Family History  Problem Relation Age of Onset  . Heart disease Mother   . Leukemia Father   . Colon cancer Neg Hx   . Esophageal cancer Neg Hx   . Stomach cancer Neg Hx   . Pancreatic cancer Neg Hx   . Liver disease Neg Hx   . Colon polyps Neg Hx     Prior to Admission medications   Medication Sig Start Date End Date Taking? Authorizing Provider   acetaminophen (TYLENOL) 325 MG tablet Take 650 mg by mouth every 6 (six) hours as needed for mild pain.    Yes [provider]  amLODipine (NORVASC) 10 MG tablet Take 10 mg by mouth every morning.    Yes [provider]  clonazePAM (KLONOPIN) 0.5 MG tablet Take 0.5 mg by mouth at bedtime as needed for anxiety.  03/18/15  Yes [provider]  docusate sodium (COLACE) 100 MG capsule Take 100 mg by mouth daily as needed for mild constipation.   Yes [provider]  enoxaparin (LOVENOX) 80 MG/0.8ML injection Inject 70 mg into the skin every 12 (twelve) hours.  12/22/18  Yes [provider]  feeding supplement, ENSURE ENLIVE, (ENSURE ENLIVE) LIQD Take 237 mLs by mouth 2 (two) times daily between meals. 08/11/18  Yes Mercy Riding, MD  ferrous sulfate 325 (65 FE) MG tablet Take 325 mg by mouth 2 (two) times daily. 04/18/19  Yes [provider]  furosemide (LASIX) 20 MG tablet Take 2 tablets (40 mg total) by mouth daily. 04/02/19 07/01/19 Yes Evans Lance, MD  HYDROcodone-acetaminophen (NORCO) 5-325 MG tablet Take 0.5 tablets by mouth every 6 (six) hours as needed for moderate pain.    Yes [provider]  irbesartan (AVAPRO) 300 MG tablet Take 300 mg by mouth every morning.   Yes [provider]  methimazole (TAPAZOLE) 5 MG tablet Take 5 mg by mouth 3 (three) times daily.   Yes [provider]  omeprazole (PRILOSEC) 20 MG capsule TAKE 1 CAPSULE (20 MG TOTAL) BY MOUTH DAILY. TAKE 30-60 MIN BEFORE DINNER Patient taking differently: Take 20 mg by mouth daily before supper. Take 30-60 min before dinner 02/19/19  Yes Lemmon, Lavone Nian, PA  ondansetron (ZOFRAN) 4 MG tablet Take 2 mg by mouth every 8 (eight) hours as needed for nausea or vomiting.    Yes [provider]  potassium chloride SA (K-DUR) 20 MEQ tablet Take 20 mEq by mouth 2 (two) times daily. 04/29/18  Yes [provider]  warfarin (COUMADIN) 2 MG  tablet Take 2-4 mg by mouth See admin instructions. Take 1 tablet (2 mg) by mouth on Monday and Friday with supper, take 2 tablets (4 mg) on all other nights of the week    [provider]   Physical Exam: Blood pressure (!) 145/54, pulse 71, temperature 98 F (36.7 C), temperature source Oral, resp. rate (!) 21, SpO2 91 %. Vitals:   05/21/19 2000 05/21/19 2100  BP: (!) 151/92 (!) 145/54  Pulse: 61 71  Resp: 20 (!) 21  Temp: 98 F (36.7 C)   SpO2: 94% 91%    Physical Exam  Constitutional: She is oriented to person, place, and time. She appears distressed.  Distressed secondary to abdominal pain  HENT:  Head: Normocephalic.  Eyes: Right eye exhibits no discharge. Left eye exhibits no discharge.  Cardiovascular:  Normal rate, regular rhythm and intact distal pulses.  Pulmonary/Chest: Effort normal and breath sounds normal. No respiratory distress. She has no wheezes. She has no rales.  Abdominal: Soft. She exhibits distension. There is abdominal tenderness. There is no rebound and no guarding.  Hyperactive bowel sounds Mild abdominal distention Suprapubic tenderness No rebound, guarding, or rigidity  Musculoskeletal:        General: No edema.     Cervical back: Neck supple.  Neurological: She is alert and oriented to person, place, and time.  Skin: Skin is warm and dry. She is not diaphoretic.    Labs on Admission:  Basic Metabolic Panel: Recent Labs  Lab 05/18/19 1227 05/19/19 2010 05/21/19 0417  NA 134* 133* 131*  K 3.8 3.8 3.4*  CL 96* 98 94*  CO2 27 24 27   GLUCOSE 119* 110* 99  BUN <5* 8 10  CREATININE 0.78 0.80 0.91  CALCIUM 9.3 8.9 8.6*   Liver Function Tests: No results for input(s): AST, ALT, ALKPHOS, BILITOT, PROT, ALBUMIN in the last 168 hours. No results for input(s): LIPASE, AMYLASE in the last 168 hours. No results for input(s): AMMONIA in the last 168 hours. CBC: Recent Labs  Lab 05/18/19 1227 05/19/19 2010 05/21/19 0417  WBC 6.9 8.0  7.9  NEUTROABS 5.6 6.6  --   HGB 11.9* 11.0* 9.9*  HCT 38.3 35.3* 30.9*  MCV 84.2 82.9 83.3  PLT 301 300 255   Cardiac Enzymes: No results for input(s): CKTOTAL, CKMB, CKMBINDEX, TROPONINI in the last 168 hours. BNP: Invalid input(s): POCBNP CBG: No results for input(s): GLUCAP in the last 168 hours.  Radiological Exams on Admission: Chest 2 View  Result Date: 05/19/2019 CLINICAL DATA:  Headache. EXAM: CHEST - 2 VIEW COMPARISON:  October 18, 2018 FINDINGS: There is a left-sided single lead pacemaker in place. There is no pneumothorax. There may be small bilateral pleural effusions. The heart size is enlarged. Aortic calcifications are noted. Bibasilar airspace disease is noted and favored to represent atelectasis. Vascular congestion is noted. There is no acute osseous abnormality. IMPRESSION: 1. Bibasilar airspace disease favored to represent atelectasis. Small bilateral pleural effusions. 2. Cardiomegaly with mild vascular congestion. 3. Left-sided pacemaker. Electronically Signed   By: Constance Holster M.D.   On: 05/19/2019 23:53   CT HEAD WO CONTRAST  Result Date: 05/21/2019 CLINICAL DATA:  Cerebral hemorrhage EXAM: CT HEAD WITHOUT CONTRAST TECHNIQUE: Contiguous axial images were obtained from the base of the skull through the vertex without intravenous contrast. COMPARISON:  05/20/2019 FINDINGS: Brain: Left occipital high-density hemorrhage measures 31 x 21 mm, unchanged. Trace extension in the left occipital horn slightly improved. No new area of hemorrhage or mass. Ventricle size normal. No hydrocephalus. No acute ischemic infarct. Motion degraded study. Vascular: Negative for hyperdense vessel Skull: Negative Sinuses/Orbits: Paranasal sinuses clear. Bilateral cataract extraction. Other: None IMPRESSION: Stable hematoma left occipital lobe. Trace extension into the left occipital horn slightly improved. No hydrocephalus. No new hemorrhage Motion degraded study. Electronically Signed   By:  Franchot Gallo M.D.   On: 05/21/2019 14:22   CT HEAD WO CONTRAST  Result Date: 05/20/2019 CLINICAL DATA:  86 year old female with acute left occipital hemorrhage yesterday. Sudden onset new nausea. EXAM: CT HEAD WITHOUT CONTRAST TECHNIQUE: Contiguous axial images were obtained from the base of the skull through the vertex without intravenous contrast. COMPARISON:  Head CT 05/19/2019. CTA head and neck 05/18/2019 and earlier. FINDINGS: Brain: Oval mildly lobulated left occipital pole intra-axial hemorrhage encompasses 34 x 24 x  28 mm (AP by transverse by CC), slightly larger than yesterday. Estimated blood volume now 11 mL, increased about 2 mL since yesterday. Regional edema and mild regional mass effect are stable. There is a trace amount of extension now into the left occipital horn on series 4, image 15. But no other intraventricular blood identified. No subdural or subarachnoid hemorrhage identified. Stable ventricle size. Basilar cisterns remain patent. Stable gray-white matter differentiation elsewhere. Vascular: No suspicious intracranial vascular hyperdensity. Skull: No acute osseous abnormality identified. Sinuses/Orbits: Visualized paranasal sinuses and mastoids are clear. Other: No acute orbit or scalp soft tissue findings. IMPRESSION: 1. Left occipital intra-axial hemorrhage has increased by 1-2 mL since yesterday, now about 3.4 cm (11 mL). But regional edema and mild regional mass effect are stable. 2. And there is trace new intraventricular extension into the left occipital horn. But ventricle size remains normal. Electronically Signed   By: Genevie Ann M.D.   On: 05/20/2019 16:52   CT Head Wo Contrast  Result Date: 05/19/2019 CLINICAL DATA:  Headache. Evaluated yesterday, interval worsening. EXAM: CT HEAD WITHOUT CONTRAST TECHNIQUE: Contiguous axial images were obtained from the base of the skull through the vertex without intravenous contrast. COMPARISON:  CT and CTA 05/18/2019 FINDINGS: Brain:  Intraparenchymal hemorrhage in the left occipital lobe measures 2.9 x 2.5 x 2.3 cm (volume = 8.7 cm^3). Retrospectively this was present on exam yesterday and measured 2.3 x 1.6 x 2.1 cm at that time, slight interval increase on the current exam. There is mild surrounding edema. No midline shift. No evidence of subarachnoid or subdural hemorrhage. Brain volume is normal for age. Mild chronic small vessel ischemia. No hydrocephalus. Vascular: Atherosclerosis of skullbase vasculature without hyperdense vessel or abnormal calcification. Skull: No fracture or focal lesion. Sinuses/Orbits: Paranasal sinuses and mastoid air cells are clear. The visualized orbits are unremarkable. Bilateral cataract resection. Other: None. IMPRESSION: Left occipital intraparenchymal hemorrhage, 8.7cc volume, slightly increased in size from exam yesterday. No midline shift. Critical Value/emergent results were called by telephone at the time of interpretation on 05/19/2019 at 10:30 pm to Dr Martinique Hunter, who verbally acknowledged these results. Electronically Signed   By: Keith Rake M.D.   On: 05/19/2019 22:31   CT ABDOMEN PELVIS W CONTRAST  Result Date: 05/21/2019 CLINICAL DATA:  Acute abdominal pain. EXAM: CT ABDOMEN AND PELVIS WITH CONTRAST TECHNIQUE: Multidetector CT imaging of the abdomen and pelvis was performed using the standard protocol following bolus administration of intravenous contrast. CONTRAST:  113mL OMNIPAQUE IOHEXOL 300 MG/ML  SOLN COMPARISON:  10/16/2018. FINDINGS: Lower chest: Calcified granuloma in the right lower lobe. Atelectasis in the left lower lobe, adjacent to an elevated left hemidiaphragm. Heart is enlarged. Calcification of the aortic valve. No pericardial or pleural effusion. Distal esophagus is unremarkable. Hepatobiliary: Subcentimeter low-attenuation lesions in the liver are unchanged but too small to characterize. Cholecystectomy. No biliary ductal dilatation. Negative. Pancreas: Negative.  Spleen: Negative. Adrenals/Urinary Tract: Adrenal glands are unremarkable. A 1.7 cm hyperdense lesion in the lower pole right kidney (3/39) may have enlarged slightly from 1.5 cm on 10/16/2026. A hyperdense lesion off the lower pole left kidney measures 3.5 cm (3/37), similar. A 2.4 cm exophytic low-attenuation lesion off the lower pole right kidney (3/38) is likely a cyst. Other low-attenuation lesions in the kidneys are too small to characterize. Ureters are decompressed. Bladder is grossly unremarkable although partially obscured by streak artifact from a right hip arthroplasty. Stomach/Bowel: Small hiatal hernia. Stomach is decompressed. Stomach, small bowel and colon are unremarkable.  Appendix is not readily visualized. Vascular/Lymphatic: Atherosclerotic calcification of the aorta without aneurysm. No pathologically enlarged lymph nodes. Reproductive: Hysterectomy.  No adnexal mass. Other: No free fluid. An 11 mm right paramidline omental nodule (3/40) is unchanged. Mesenteries and peritoneum are otherwise unremarkable. Musculoskeletal: Right hip arthroplasty. Degenerative changes in the spine. No worrisome lytic or sclerotic lesions. IMPRESSION: 1. No acute findings to explain the patient's pain. 2. Hyperdense lesions in both kidneys cannot be characterized as simple cysts. Lesion on the right appears minimally larger. If further evaluation is desired, CT or MR without and with contrast could be performed. 3. Isolated and nonspecific right paramidline omental nodule, unchanged. 4.  Aortic atherosclerosis (ICD10-I70.0). Electronically Signed   By: Lorin Picket M.D.   On: 05/21/2019 14:26   ECHOCARDIOGRAM COMPLETE  Result Date: 05/20/2019    ECHOCARDIOGRAM REPORT   Patient Name:   [name] Date of Exam: 05/20/2019 Medical Rec #:  [id number]         Height:       63.0 in Accession #:    [id number]        Weight:       150.0 lb Date of Birth:  1925-06-03          BSA:          38.711 m Patient Age:     3 years          BP:           128/50 mmHg Patient Gender: F                 HR:           59 bpm. Exam Location:  [facility] Procedure: 2D Echo, Cardiac Doppler and Color Doppler Indications:    Stroke 434.91 / I163.9  History:        Patient has prior history of Echocardiogram examinations, most                 recent 07/19/2018. Pacemaker, Arrythmias:Atrial Fibrillation;                 Risk Factors:Hypertension. Chronic anticoagulation,GERD, Cancer.  Sonographer:    Alvino Chapel RCS Referring Phys: [place]  1. Left ventricular ejection fraction, by estimation, is 60 to 65%. The left ventricle has normal function. The left ventricle has no regional wall motion abnormalities. Left ventricular diastolic parameters are indeterminate in the setting of atrial fibrillation.  2. Right ventricular systolic function is normal. The right ventricular size is normal. There is moderately elevated pulmonary artery systolic pressure. The estimated right ventricular systolic pressure is 123XX123 mmHg.  3. Left atrial size was severely dilated.  4. Right atrial size was moderately dilated.  5. The mitral valve is degenerative. Trivial mitral valve regurgitation.  6. The aortic valve is tricuspid. Aortic valve regurgitation is not visualized.  7. The inferior vena cava is dilated in size with >50% respiratory variability, suggesting right atrial pressure of 8 mmHg. FINDINGS  Left Ventricle: Left ventricular ejection fraction, by estimation, is 60 to 65%. The left ventricle has normal function. The left ventricle has no regional wall motion abnormalities. The left ventricular internal cavity size was normal in size. There is  borderline left ventricular hypertrophy. Left ventricular diastolic parameters are indeterminate. Right Ventricle: The right ventricular size is normal. No increase in right ventricular wall thickness. Right ventricular systolic function is normal. There is moderately elevated pulmonary  artery systolic pressure. The tricuspid regurgitant velocity  is 3.00 m/s, and with an assumed right atrial pressure of 8 mmHg, the estimated right ventricular systolic pressure is 123XX123 mmHg. Left Atrium: Left atrial size was severely dilated. Right Atrium: Right atrial size was moderately dilated. Pericardium: There is no evidence of pericardial effusion. Mitral Valve: The mitral valve is degenerative in appearance. There is mild thickening of the mitral valve leaflet(s). Moderate mitral annular calcification. Trivial mitral valve regurgitation. Tricuspid Valve: The tricuspid valve is grossly normal. Tricuspid valve regurgitation is trivial. Aortic Valve: The aortic valve is tricuspid. Aortic valve regurgitation is not visualized. Mild aortic valve annular calcification. There is mild calcification of the aortic valve. Aortic valve mean gradient measures 10.0 mmHg. Aortic valve peak gradient  measures 17.8 mmHg. Aortic valve area, by VTI measures 1.96 cm. Pulmonic Valve: The pulmonic valve was grossly normal. Pulmonic valve regurgitation is trivial. Aorta: The aortic root is normal in size and structure. Venous: The inferior vena cava is dilated in size with greater than 50% respiratory variability, suggesting right atrial pressure of 8 mmHg. IAS/Shunts: No atrial level shunt detected by color flow Doppler. Additional Comments: A pacer wire is visualized.  LEFT VENTRICLE PLAX 2D LVIDd:         4.10 cm LVIDs:         2.50 cm LV PW:         0.90 cm LV IVS:        1.10 cm LVOT diam:     2.10 cm LV SV:         94.56 ml LV SV Index:   55.26 LVOT Area:     3.46 cm  LV Volumes (MOD) LV vol d, MOD A2C: 60.2 ml LV vol d, MOD A4C: 55.9 ml LV vol s, MOD A2C: 16.4 ml LV vol s, MOD A4C: 23.6 ml LV SV MOD A2C:     43.8 ml LV SV MOD A4C:     55.9 ml LV SV MOD BP:      38.5 ml RIGHT VENTRICLE TAPSE (M-mode): 1.9 cm LEFT ATRIUM              Index       RIGHT ATRIUM           Index LA diam:        4.00 cm  2.34 cm/m  RA Area:      25.10 cm LA Vol (A2C):   129.0 ml 75.39 ml/m RA Volume:   79.40 ml  46.40 ml/m LA Vol (A4C):   86.6 ml  50.61 ml/m LA Biplane Vol: 107.0 ml 62.53 ml/m  AORTIC VALVE AV Area (Vmax):    2.08 cm AV Area (Vmean):   2.03 cm AV Area (VTI):     1.96 cm AV Vmax:           211.00 cm/s AV Vmean:          146.000 cm/s AV VTI:            0.482 m AV Peak Grad:      17.8 mmHg AV Mean Grad:      10.0 mmHg LVOT Vmax:         127.00 cm/s LVOT Vmean:        85.500 cm/s LVOT VTI:          0.273 m LVOT/AV VTI ratio: 0.57  AORTA Ao Root diam: 3.10 cm MITRAL VALVE                TRICUSPID VALVE MV Area (  PHT): 5.25 cm     TR Peak grad:   36.0 mmHg MV Decel Time: 145 msec     TR Vmax:        300.00 cm/s MV E velocity: 161.00 cm/s                             SHUNTS                             Systemic VTI:  0.27 m                             Systemic Diam: 2.10 cm Rozann Lesches MD Electronically signed by Rozann Lesches MD Signature Date/Time: 05/20/2019/2:54:19 PM    Final    DG HIP UNILAT WITH PELVIS 2-3 VIEWS RIGHT  Result Date: 05/20/2019 CLINICAL DATA:  Right hip pain after a fall. EXAM: DG HIP (WITH OR WITHOUT PELVIS) 2-3V RIGHT COMPARISON:  10/18/2018 FINDINGS: Previous total hip arthroplasty on the right. No complicating feature seen. No evidence of fracture. No other pelvic abnormal finding. IMPRESSION: Good appearance following previous right hip arthroplasty. No traumatic finding. Electronically Signed   By: Nelson Chimes M.D.   On: 05/20/2019 05:24    Impression/Recommendations Active Problems: Abdominal pain ICH (intracerebral hemorrhage) ([place])    1. Severe abdominal pain: History of appendectomy and cholecystectomy.  No leukocytosis and lactic acid level normal on labs done earlier today.  CT abdomen pelvis done earlier today showing hypodense lesions in both kidneys but no other acute abnormality to explain the patient's severe abdominal pain.  Will order repeat labs including CBC, CMP, and lactate.   Check UA to rule out UTI.  Morphine as needed for severe pain.  Antiemetic as needed.  There is concern for mesenteric ischemia from arterial thrombus given history of A. fib and anticoagulation being recently stopped due to intracranial hemorrhage.  Will order stat CT angiogram abdomen and pelvis.  Keep n.p.o. Not a candidate for anticoagulation given intracranial hemorrhage.  If CT angiogram confirms arterial thrombus, recommend IR consultation for endovascular procedure.  2.  Left occipital lobe intracranial hemorrhage in the setting of A. fib on anticoagulation and hypertensive emergency: Management per primary team.  TRH will followup again tomorrow. Please contact me if I can be of assistance in the meanwhile. Thank you for this consultation.  Time spent: 60 minutes  [facility]  If 7PM-7AM, please contact night-coverage [email or web address] Password [facility] 05/21/2019, 10:03 PM

## 2019-05-21 NOTE — Evaluation (Signed)
Speech Language Pathology Evaluation Patient Details Name: [name] MRN: [id number] DOB: 02/09/1926 Today's Date: 05/21/2019 Time: 1012-1028 SLP Time Calculation (min) (ACUTE ONLY): 16 min  Problem List:  Patient Active Problem List   Diagnosis Date Noted  . ICH (intracerebral hemorrhage) ([place]) 05/19/2019  . V tach ([place]) 08/09/2018  . Intractable nausea and vomiting 08/09/2018  . Hyponatremia 08/09/2018  . Hypomagnesemia 08/09/2018  . Hyperthyroidism 08/09/2018  . Epigastric pain   . Nonspecific chest pain 07/18/2018  . Anxiety 07/18/2018  . Insomnia 07/18/2018  . Complication of anesthesia   . Intractable headache 01/06/2017  . Intractable pain 01/04/2017  . Chronic anticoagulation 01/03/2017  . Neck pain 01/03/2017  . Acute bronchitis 06/08/2016  . Fall 09/24/2015  . Dizziness 09/24/2015  . Postoperative anemia due to acute blood loss 12/27/2013  . Postop Transfusion 12/27/2013  . OA (osteoarthritis) of hip 12/24/2013  . Preop cardiovascular exam 09/25/2013  . Cardiac pacemaker in situ 08/29/2008  . Essential hypertension 08/21/2008  . ATRIAL FIBRILLATION 08/21/2008  . Bradycardia 08/21/2008  . PNEUMONIA 08/21/2008  . GERD 08/21/2008  . NEPHROLITHIASIS 08/21/2008  . Osteoarthritis 08/21/2008   Past Medical History:  Past Medical History:  Diagnosis Date  . Anxiety   . Atrial fibrillation ([place])   . Bradycardia   . Cancer ([place])    skin  . Cataract    removed bilat with lens both eyes   . Complication of anesthesia   . Difficulty sleeping   . Dysrhythmia    A FIB  . GERD (gastroesophageal reflux disease)   . H/O hiatal hernia   . History of kidney stones   . History of skin cancer   . History of transfusion   . Hyperlipidemia   . Hypertension   . Osteoarthritis   . Pacemaker   . Pneumonia   . PONV (postoperative nausea and vomiting)   . Shortness of breath    WITH EXERTION   Past Surgical History:  Past Surgical History:  Procedure  Laterality Date  . APPENDECTOMY  1960  . ARTHROPLASTY  1994   Left total knee  . ARTHROPLASTY  2000   Total right knee  . BACK SURGERY    . CATARACT EXTRACTION, BILATERAL     with lens implants  . CHOLECYSTECTOMY    . COLONOSCOPY    . DG SELECTED HSG GDC ONLY  2002   Dilation  . Microdiskectomy  05/2004   Left L4-L5  . PACEMAKER INSERTION    . POLYPECTOMY    . TOTAL ABDOMINAL HYSTERECTOMY  1969  . TOTAL HIP ARTHROPLASTY Right 12/24/2013   Procedure: RIGHT TOTAL HIP ARTHROPLASTY ANTERIOR APPROACH;  Surgeon: Gearlean Alf, MD;  Location: [facility];  Service: Orthopedics;  Laterality: Right;  . UPPER GASTROINTESTINAL ENDOSCOPY     HPI:  Pt is a 86 yo female admitted with c/o headache and visual changes.  PT found o have a L occipital ICH.  Pt with h/o cataracts, B TKRs, back surgery and pacemaker.   Assessment / Plan / Recommendation Clinical Impression   Pt's congition and language skills were assessed via subtests of the COGNISTAT. Pt presents with deficits within all levels of verbal expression, not typically noted with occipital lobe infarcts. She initially had trouble with orientation, however with cues simplifying each question, she was able to accurately orient x4. As pt was presented with a naming activity, she was ~0-25% accurate. The pt was aware and stated that she was having difficulties with her verbal expression.  However, the assessment was limited d/t pt's stomach pain, and requested privacy. Will continue to follow to determine differential diagnosis, and target difficulties mentioned.    SLP Assessment  SLP Recommendation/Assessment: Patient needs continued Speech Lanaguage Pathology Services SLP Visit Diagnosis: Aphasia (R47.01)    Follow Up Recommendations  Skilled Nursing facility    Frequency and Duration min 2x/week  2 weeks      SLP Evaluation Cognition  Overall Cognitive Status: Impaired/Different from baseline Arousal/Alertness: Awake/alert Orientation  Level: Oriented to person;Oriented to time;Oriented to place Attention: Sustained Sustained Attention: Appears intact Memory: Appears intact Awareness: Appears intact Problem Solving: Appears intact Safety/Judgment: Appears intact       Comprehension  Auditory Comprehension Overall Auditory Comprehension: Appears within functional limits for tasks assessed Visual Recognition/Discrimination Discrimination: Not tested Reading Comprehension Reading Status: Not tested    Expression Expression Primary Mode of Expression: Verbal Verbal Expression Overall Verbal Expression: Impaired Initiation: Impaired Automatic Speech: Name;Social Response;Day of week Level of Generative/Spontaneous Verbalization: Sentence Naming: Impairment Responsive: 0-25% accurate Verbal Errors: Semantic paraphasias Written Expression Dominant Hand: Right   Oral / Motor  Oral Motor/Sensory Function Overall Oral Motor/Sensory Function: Within functional limits Motor Speech Overall Motor Speech: Impaired Respiration: Within functional limits Phonation: Normal Resonance: Within functional limits Articulation: Within functional limitis Intelligibility: Intelligible Motor Planning: Witnin functional limits Motor Speech Errors: Aware   GO                   Aline August, Student SLP Office: [phone]  05/21/2019, 10:40 AM

## 2019-05-21 NOTE — Evaluation (Signed)
Occupational Therapy Evaluation Patient Details Name: [name] MRN: [id number] DOB: 09-16-1925 Today's Date: 05/21/2019    History of Present Illness Pt is a 86 yo female admitted with c/o headache and visual changes.  PT found o have a L occipital ICH.  Pt with h/o cataracts, B TKRs, back surgery and pacemaker.   Clinical Impression   Pt admitted with the above diagnosis and has the deficits listed below. Pt would benefit from cont OT to increase independence and safety with all adls and transfers due to decreased vision.  Pt very aware of her decreased vision on the right which makes her a safety risk for living alone. Pt will need extensive therapy before being able to return to living alone.  Pt's sons live close by and assist with food, cleaning and driving but they are unable to live with the pt.  Feel pt will need SNF rehab to attempt to reach baseline and live alone again.  Will focus future session on adl and how it is impacted by her decreased vision.      Follow Up Recommendations  SNF;Supervision/Assistance - 24 hour    Equipment Recommendations  3 in 1 bedside commode    Recommendations for Other Services       Precautions / Restrictions Precautions Precautions: Fall Precaution Comments: Pt with R hemianopsia Restrictions Weight Bearing Restrictions: No      Mobility Bed Mobility Overal bed mobility: Needs Assistance Bed Mobility: Supine to Sit     Supine to sit: Min assist;HOB elevated     General bed mobility comments: Pt required min assist to achieve full sitting after bed was flattened and cues to move forward on the bed to get feet on the floor.  Transfers Overall transfer level: Needs assistance Equipment used: 1 person hand held assist Transfers: Sit to/from [facility] Sit to Stand: Min assist Stand pivot transfers: Mod assist       General transfer comment: Pt very anxious with mobility.      Balance Overall balance  assessment: Needs assistance Sitting-balance support: Feet supported Sitting balance-Leahy Scale: Fair     Standing balance support: Bilateral upper extremity supported;During functional activity Standing balance-Leahy Scale: Fair Standing balance comment: pt reliant on outside support.  Feel walker would be better option than cane(which she normally uses) at this time.                           ADL either performed or assessed with clinical judgement   ADL Overall ADL's : Needs assistance/impaired Eating/Feeding: Set up;Sitting;Cueing for compensatory techinques Eating/Feeding Details (indicate cue type and reason): cues to see food on R of plate. Grooming: Set up;Sitting;Cueing for compensatory techniques Grooming Details (indicate cue type and reason): cues for vision and different set up of grooming items than what accustomed to at home.  Upper Body Bathing: Supervision/ safety;Sitting;Cueing for compensatory techniques   Lower Body Bathing: Moderate assistance;Sit to/from stand;Cueing for compensatory techniques Lower Body Bathing Details (indicate cue type and reason): assist for balance when on her feet. Pt unable to take challenges when on her feet without external support. Upper Body Dressing : Sitting;Moderate assistance;Cueing for compensatory techniques   Lower Body Dressing: Moderate assistance;Sit to/from stand;Cueing for compensatory techniques Lower Body Dressing Details (indicate cue type and reason): cues for vision. Toilet Transfer: Minimal Production assistant, radio Details (indicate cue type and reason): easier to transfer to the Left as that is where she can see.  Toileting- Clothing Manipulation and Hygiene: Moderate assistance;Sit to/from stand;Cueing for compensatory techniques       Functional mobility during ADLs: Minimal assistance General ADL Comments: Pt most limited by poor balance, decreased vision and perception due to R  visual field cut.     Vision Baseline Vision/History: Wears glasses;Cataracts Wears Glasses: Reading only Patient Visual Report: Peripheral vision impairment Vision Assessment?: Yes Eye Alignment: Within Functional Limits Ocular Range of Motion: Impaired-to be further tested in functional context Alignment/Gaze Preference: Gaze left Tracking/Visual Pursuits: Requires cues, head turns, or add eye shifts to track Convergence: Within functional limits Visual Fields: Right homonymous hemianopsia Additional Comments: Pt very aware of her visual loss on the right.      Perception     Praxis Praxis Praxis tested?: Within functional limits    Pertinent Vitals/Pain Pain Assessment: Faces Faces Pain Scale: Hurts a little bit Pain Location: head Pain Descriptors / Indicators: Aching Pain Intervention(s): Monitored during session;Repositioned     Hand Dominance Right   Extremity/Trunk Assessment Upper Extremity Assessment Upper Extremity Assessment: Overall WFL for tasks assessed   Lower Extremity Assessment Lower Extremity Assessment: Defer to PT evaluation   Cervical / Trunk Assessment Cervical / Trunk Assessment: Normal   Communication Communication Communication: HOH   Cognition Arousal/Alertness: Awake/alert Behavior During Therapy: WFL for tasks assessed/performed Overall Cognitive Status: Impaired/Different from baseline                                 General Comments: Pt was oriented to all but situation.  STM was intact for home set up and previous level of functioning(called son to confirm).  Pt with speech difficulties (expressive and receptive) that make full eval of cognition difficult.  Decreased vision is affecting problem solving.   General Comments  Pt with bruising on her forehead area. Pt states she has not fallen at all at home but the chart says otherwise.  Talked at length with Rush Landmark, son, about her current level of function to confirm her  statements and all were accurate despite expressive difficulties.    Exercises     Shoulder Instructions      Home Living Family/patient expects to be discharged to:: Private residence Living Arrangements: Alone;Other (Comment)(sons live next door.) Available Help at Discharge: Family;Available PRN/intermittently Type of Home: House Home Access: Stairs to enter [employer] of Steps: 2 Entrance Stairs-Rails: None Home Layout: One level;Laundry or work area in basement     [employer]: Occupational psychologist: [place]: Kasandra Knudsen - single point;Walker - 2 wheels;Shower seat   Additional Comments: Pt has stopped driving and cooking. Family brings her groceries and meals.  Also has meals on wheels.       Prior Functioning/Environment Level of Independence: Needs assistance  Gait / Transfers Assistance Needed: uses a cane for gait both inside the home and for community access.  ADL's / Homemaking Assistance Needed: granddaughter does the cleaning, others cook   Comments: was active in church precovid.        OT Problem List: Decreased activity tolerance;Impaired balance (sitting and/or standing);Impaired vision/perception;Decreased cognition;Decreased safety awareness;Decreased knowledge of use of DME or AE;Pain;Decreased knowledge of precautions      OT Treatment/Interventions: Self-care/ADL training;Cognitive remediation/compensation;Visual/perceptual remediation/compensation;Balance training;DME and/or AE instruction    OT Goals(Current goals can be found in the care plan section) Acute Rehab OT Goals Patient Stated Goal: to get better OT  Goal Formulation: With patient Time For Goal Achievement: 06/04/19 Potential to Achieve Goals: Good ADL Goals Pt Will Perform Eating: with modified independence Pt Will Perform Grooming: with supervision;standing Pt Will Perform Upper Body Dressing: with supervision;sitting Pt Will Perform  Lower Body Dressing: with min guard assist;sit to/from stand Pt Will Transfer to Toilet: with supervision;stand pivot transfer;regular height toilet Additional ADL Goal #1: Pt will locate adl items in room with min cues to turn head to R to locate items on the right due to vision loss.  OT Frequency: Min 2X/week   Barriers to D/C: Decreased caregiver support  sons live next door but they cannot live with her and she cannot live with them       Co-evaluation              AM-PAC OT "6 Clicks" Daily Activity     Outcome Measure Help from another person eating meals?: A Little Help from another person taking care of personal grooming?: A Little Help from another person toileting, which includes using toliet, bedpan, or urinal?: A Little Help from another person bathing (including washing, rinsing, drying)?: A Little Help from another person to put on and taking off regular upper body clothing?: A Little Help from another person to put on and taking off regular lower body clothing?: A Lot 6 Click Score: 17   End of Session Equipment Utilized During Treatment: Oxygen Nurse Communication: Mobility status  Activity Tolerance: Patient tolerated treatment well Patient left: in chair;with call bell/phone within reach;with chair alarm set  OT Visit Diagnosis: Unsteadiness on feet (R26.81);Low vision, both eyes (H54.2);Pain                Time: [id number] OT Time Calculation (min): 29 min Charges:  OT General Charges $OT Visit: 1 Visit OT Evaluation $OT Eval Moderate Complexity: 1 Mod OT Treatments $Self Care/Home Management : 8-22 mins   Glenford Peers 05/21/2019, 9:58 AM

## 2019-05-21 NOTE — Consult Note (Signed)
   [facility] CM Inpatient Consult   05/21/2019  [name] [birth year] [id number]    THN (Showing) Active Status:   Patient has been active with [facility]) Care Management services. Patient has been engaged by [facility] for continued disease management educationon HTN, however, noted case closure today.  Patient checked for 21% riskofunplanned readmission,and hospitalization under her [employer].  Per chart review and MD notes show as follows: [name] is a 86 y.o. female with history of Hld, Htn, PPM (bradycardia), atrial fibrillation on Lovenox PTA, whore-presents with severe headache found to have a medium sized left occipital lobe hemorrhage with a small amount of adjacent vasogenic edema . She did not receive IV t-PA due to hemorrhage.  (Stroke:  left occipital lobe ICH in the setting of lovenox for atrial fibrillation and hypertension).  Per OT evaluation note completed, shows that patient will need skilled nursing facility (SNF) rehab to attempt to reach baseline and live alone again. Await pending PT evaluation.  Plan: Will notify [facility]) care management team to adjust patient's [facility] current status. Will follow for disposition anddischarge recommendations. Will reassign patientpost dischargefor any care management follow-up needed, as appropriate.  Her primary care provider isDr. Tisovec, Fransico Him, MD with [facility] asproviding transition of care.   Of note, [facility] Care Management services does not replace or interfere with any services that are needed or arranged by inpatient [facility] care management team.    For additional questions,please contact:  Edwena Felty A. Illias Pantano, BSN, RN-BC [facility] Liaison Cell: [phone]

## 2019-05-22 DIAGNOSIS — R062 Wheezing: Secondary | ICD-10-CM

## 2019-05-22 DIAGNOSIS — I615 Nontraumatic intracerebral hemorrhage, intraventricular: Secondary | ICD-10-CM

## 2019-05-22 DIAGNOSIS — R1030 Lower abdominal pain, unspecified: Secondary | ICD-10-CM

## 2019-05-22 LAB — CBC
HCT: 32.9 % — ABNORMAL LOW (ref 36.0–46.0)
Hemoglobin: 10.5 g/dL — ABNORMAL LOW (ref 12.0–15.0)
MCH: 26.1 pg (ref 26.0–34.0)
MCHC: 31.9 g/dL (ref 30.0–36.0)
MCV: 81.8 fL (ref 80.0–100.0)
Platelets: 257 10*3/uL (ref 150–400)
RBC: 4.02 MIL/uL (ref 3.87–5.11)
RDW: 23.7 % — ABNORMAL HIGH (ref 11.5–15.5)
WBC: 6.8 10*3/uL (ref 4.0–10.5)
nRBC: 0 % (ref 0.0–0.2)

## 2019-05-22 LAB — BASIC METABOLIC PANEL
Anion gap: 12 (ref 5–15)
BUN: 9 mg/dL (ref 8–23)
CO2: 23 mmol/L (ref 22–32)
Calcium: 8.7 mg/dL — ABNORMAL LOW (ref 8.9–10.3)
Chloride: 96 mmol/L — ABNORMAL LOW (ref 98–111)
Creatinine, Ser: 0.96 mg/dL (ref 0.44–1.00)
GFR calc Af Amer: 60 mL/min — ABNORMAL LOW (ref >60)
GFR calc non Af Amer: 52 mL/min — ABNORMAL LOW (ref >60)
Glucose, Bld: 91 mg/dL (ref 70–99)
Potassium: 3.7 mmol/L (ref 3.5–5.1)
Sodium: 131 mmol/L — ABNORMAL LOW (ref 135–145)

## 2019-05-22 LAB — URINALYSIS, ROUTINE W REFLEX MICROSCOPIC
Bilirubin Urine: NEGATIVE
Glucose, UA: NEGATIVE mg/dL
Hgb urine dipstick: NEGATIVE
Ketones, ur: NEGATIVE mg/dL
Leukocytes,Ua: NEGATIVE
Nitrite: NEGATIVE
Protein, ur: NEGATIVE mg/dL
Specific Gravity, Urine: 1.042 — ABNORMAL HIGH (ref 1.005–1.030)
pH: 7 (ref 5.0–8.0)

## 2019-05-22 MED ORDER — HYDRALAZINE HCL 50 MG PO TABS
75.0000 mg | ORAL_TABLET | Freq: Three times a day (TID) | ORAL | Status: DC
  Administered 2019-05-22 – 2019-05-24 (×7): 75 mg via ORAL
  Filled 2019-05-22 (×7): qty 1

## 2019-05-22 MED ORDER — MORPHINE SULFATE (PF) 2 MG/ML IV SOLN
2.0000 mg | Freq: Four times a day (QID) | INTRAVENOUS | Status: DC | PRN
  Administered 2019-05-22: 2 mg via INTRAVENOUS
  Filled 2019-05-22: qty 1

## 2019-05-22 MED ORDER — MINERAL OIL RE ENEM
1.0000 | ENEMA | RECTAL | Status: DC | PRN
  Filled 2019-05-22: qty 1

## 2019-05-22 MED ORDER — HYDRALAZINE HCL 20 MG/ML IJ SOLN: 5.0000 mg | INTRAMUSCULAR | Status: DC | PRN

## 2019-05-22 MED ORDER — ALBUTEROL SULFATE (2.5 MG/3ML) 0.083% IN NEBU: 3.0000 mL | INHALATION_SOLUTION | RESPIRATORY_TRACT | Status: DC | PRN

## 2019-05-22 NOTE — Progress Notes (Signed)
PROGRESS NOTE    [name]    Code Status: Full Code  AK:[id number] DOB: 06-30-1925 DOA: 05/19/2019 LOS: 3 days  PCP: [name], MD CC:  Chief Complaint  Patient presents with  . Headache  . Stroke Symptoms       Hospital Summary   Per Dr. Marlowe Sax Consult Note: Patient is a 86 year old female with a past medical history of A. fib on Coumadin, bradycardia status post PPM, GERD, nephrolithiasis, hypertension, hyperlipidemia, history of appendectomy and cholecystectomy admitted under neurology service to the ICU for severe headache secondary to left occipital lobe intracranial hemorrhage in the setting of A. fib on anticoagulation and hypertensive emergency.  Anticoagulation was stopped.  Patient complained of acute onset severe abdominal pain this morning.  CT abdomen pelvis was ordered and revealed no acute findings to explain the patient's pain.  Showing hyperdense lesions in both kidneys, cannot be characterized as simple cysts.  Labs done this afternoon showing normal high-sensitivity troponin level.  Normal lactic acid level.  Labs done earlier this morning showing no leukocytosis.  Hemoglobin 9.9, baseline in the 10-11 range.  Her abdominal pain was thought to be due to stool/gas impaction and patient was given Fleet enema but continued to complain of severe abdominal pain.  TRH consulted for management of abdominal pain.  A & P   Principal Problem:   Abdominal pain Active Problems:   ICH (intracerebral hemorrhage) (HCC)   1. Severe abdominal pain, likely secondary to constipation vs. Less likely nonocclusive mesenteric ischemia a. CTA abdomen/pelvis without any signs of thrombotic/embolic disease.  b. BP has fluctuated between hypertensive crisis to borderline hypotension but not for long periods of time to lead to longstanding hypoperfusion. Unlikely nonocclusive mesenteric ischemia but not ruled out. c. Bilateral hyperdense lesions unlikely causing patient's pain.   d. Pain has since resolved with morphine last night. e. Recommend bowel regimen once patient is tolerating PO intake and mineral oil enemas PRN f. Try to avoid extremes of BP g. If pain recurs, consider GI consult 2. End expiratory wheeze tolerating room air. PRN albuterol inhaler 3. Left occipital lobe intracranial hemorrhage in setting of Atrial fibrillation on anticoagulation and hypertensive emergency 4. Hypertensive Emergency 5. Hyperlipidemia 6. Hyponatremia 7. Bradycardia s/p PPM 8. Normocytic anemia 9. Hypokalemia  Remaining issues per primary team  TRH will sign off. Please do not hesitate to reach back out if any issues. Thank you for this interesting consult.   Family Communication: Patient's son has been updated at bedside  Pressure injury documentation    None  Consultants  [facility] Neurology   Antibiotics   Anti-infectives (From admission, onward)   None        Subjective   Patient seen and examined at bedside in no acute distress and resting comfortably. No acute events overnight. Denies any acute complaints at this time. Just finished ambulating with PT on my arrival. Son present at bedside. PT noted patient had a BM this morning, patient stated "it was a monster!" Abdominal pain has since resolved. No other complaints    Objective   Vitals:   05/22/19 0400 05/22/19 0500 05/22/19 0600 05/22/19 0800  BP: (!) 148/58 (!) 151/50 (!) 139/59   Pulse: 60 (!) 59 (!) 59   Resp: 16 14 19    Temp: 98.2 F (36.8 C)   98.5 F (36.9 C)  TempSrc: Oral   Oral  SpO2: 96% 97% 95%     Intake/Output Summary (Last 24 hours) at 05/22/2019 1037 Last  data filed at 05/22/2019 0600 Gross per 24 hour  Intake 286.68 ml  Output 1100 ml  Net -813.32 ml   There were no vitals filed for this visit.  Examination:  Physical Exam Vitals and nursing note reviewed.  Constitutional:      Appearance: Normal appearance.  HENT:     Head: Normocephalic and atraumatic.  Eyes:      Conjunctiva/sclera: Conjunctivae normal.  Cardiovascular:     Rate and Rhythm: Normal rate and regular rhythm.  Pulmonary:     Effort: Pulmonary effort is normal.     Comments: End expiratory wheeze  Abdominal:     General: Abdomen is flat. There is no distension.     Palpations: Abdomen is soft.     Tenderness: There is no abdominal tenderness.  Musculoskeletal:        General: No swelling or tenderness.  Skin:    Coloration: Skin is not jaundiced or pale.  Neurological:     Mental Status: She is alert. Mental status is at baseline.  Psychiatric:        Mood and Affect: Mood normal.        Behavior: Behavior normal.     Data Reviewed: I have personally reviewed following labs and imaging studies  CBC: Recent Labs  Lab 05/18/19 1227 05/19/19 2010 05/21/19 0417 05/21/19 2159 05/22/19 0520  WBC 6.9 8.0 7.9 10.2 6.8  NEUTROABS 5.6 6.6  --   --   --   HGB 11.9* 11.0* 9.9* 11.2* 10.5*  HCT 38.3 35.3* 30.9* 34.5* 32.9*  MCV 84.2 82.9 83.3 80.6 81.8  PLT 301 300 255 264 [id number]   Basic Metabolic Panel: Recent Labs  Lab 05/18/19 1227 05/19/19 2010 05/21/19 0417 05/21/19 2159 05/22/19 0520  NA 134* 133* 131* 127* 131*  K 3.8 3.8 3.4* 4.3 3.7  CL 96* 98 94* 94* 96*  CO2 27 24 27 23 23   GLUCOSE 119* 110* 99 110* 91  BUN <5* 8 10 12 9   CREATININE 0.78 0.80 0.91 0.91 0.96  CALCIUM 9.3 8.9 8.6* 8.6* 8.7*   GFR: Estimated Creatinine Clearance: 35.3 mL/min (by C-G formula based on SCr of 0.96 mg/dL). Liver Function Tests: Recent Labs  Lab 05/21/19 2159  AST 26  ALT 22  ALKPHOS 84  BILITOT 0.8  PROT 6.0*  ALBUMIN 3.4*   No results for input(s): LIPASE, AMYLASE in the last 168 hours. No results for input(s): AMMONIA in the last 168 hours. Coagulation Profile: Recent Labs  Lab 05/18/19 1227 05/19/19 2351  INR 1.0 1.0   Cardiac Enzymes: No results for input(s): CKTOTAL, CKMB, CKMBINDEX, TROPONINI in the last 168 hours. BNP (last 3 results) No results for  input(s): PROBNP in the last 8760 hours. HbA1C: Recent Labs    05/19/19 2351  HGBA1C 5.2   CBG: No results for input(s): GLUCAP in the last 168 hours. Lipid Profile: Recent Labs    05/21/19 0417 05/21/19 1116  CHOL 193  --   HDL 59  --   LDLCALC 126*  --   TRIG 40 135  CHOLHDL 3.3  --    Thyroid Function Tests: No results for input(s): TSH, T4TOTAL, FREET4, T3FREE, THYROIDAB in the last 72 hours. Anemia Panel: No results for input(s): VITAMINB12, FOLATE, FERRITIN, TIBC, IRON, RETICCTPCT in the last 72 hours. Sepsis Labs: Recent Labs  Lab 05/21/19 1116 05/21/19 1441 05/21/19 2159  LATICACIDVEN 1.9 1.3 1.0    Recent Results (from the past 240 hour(s))  SARS CORONAVIRUS 2 (TAT  6-24 HRS) Nasopharyngeal Nasopharyngeal Swab     Status: None   Collection Time: 05/19/19  8:47 PM   Specimen: Nasopharyngeal Swab  Result Value Ref Range Status   SARS Coronavirus 2 NEGATIVE NEGATIVE Final    Comment: (NOTE) SARS-CoV-2 target nucleic acids are NOT DETECTED. The SARS-CoV-2 RNA is generally detectable in upper and lower respiratory specimens during the acute phase of infection. Negative results do not preclude SARS-CoV-2 infection, do not rule out co-infections with other pathogens, and should not be used as the sole basis for treatment or other patient management decisions. Negative results must be combined with clinical observations, patient history, and epidemiological information. The expected result is Negative. Fact Sheet for Patients: SugarRoll.be Fact Sheet for Healthcare Providers: https://www.woods-mathews.com/ This test is not yet approved or cleared by the [place] FDA and  has been authorized for detection and/or diagnosis of SARS-CoV-2 by FDA under an Emergency Use Authorization (EUA). This EUA will remain  in effect (meaning this test can be used) for the duration of the COVID-19 declaration under Section 56 4(b)(1)  of the Act, 21 U.S.C. section 360bbb-3(b)(1), unless the authorization is terminated or revoked sooner. Performed at [facility], [place] [address], [place], [place] [address]   MRSA PCR Screening     Status: None   Collection Time: 05/20/19  1:39 AM   Specimen: Nasal Mucosa; Nasopharyngeal  Result Value Ref Range Status   MRSA by PCR NEGATIVE NEGATIVE Final    Comment:        The GeneXpert MRSA Assay (FDA approved for NASAL specimens only), is one component of a comprehensive MRSA colonization surveillance program. It is not intended to diagnose MRSA infection nor to guide or monitor treatment for MRSA infections. Performed at [facility], [place] [address], [place],  [address]          Radiology Studies: CT HEAD WO CONTRAST  Result Date: 05/21/2019 CLINICAL DATA:  Cerebral hemorrhage EXAM: CT HEAD WITHOUT CONTRAST TECHNIQUE: Contiguous axial images were obtained from the base of the skull through the vertex without intravenous contrast. COMPARISON:  05/20/2019 FINDINGS: Brain: Left occipital high-density hemorrhage measures 31 x 21 mm, unchanged. Trace extension in the left occipital horn slightly improved. No new area of hemorrhage or mass. Ventricle size normal. No hydrocephalus. No acute ischemic infarct. Motion degraded study. Vascular: Negative for hyperdense vessel Skull: Negative Sinuses/Orbits: Paranasal sinuses clear. Bilateral cataract extraction. Other: None IMPRESSION: Stable hematoma left occipital lobe. Trace extension into the left occipital horn slightly improved. No hydrocephalus. No new hemorrhage Motion degraded study. Electronically Signed   By: Franchot Gallo M.D.   On: 05/21/2019 14:22   CT HEAD WO CONTRAST  Result Date: 05/20/2019 CLINICAL DATA:  86 year old female with acute left occipital hemorrhage yesterday. Sudden onset new nausea. EXAM: CT HEAD WITHOUT CONTRAST TECHNIQUE: Contiguous axial images were obtained from the base of the skull  through the vertex without intravenous contrast. COMPARISON:  Head CT 05/19/2019. CTA head and neck 05/18/2019 and earlier. FINDINGS: Brain: Oval mildly lobulated left occipital pole intra-axial hemorrhage encompasses 34 x 24 x 28 mm (AP by transverse by CC), slightly larger than yesterday. Estimated blood volume now 11 mL, increased about 2 mL since yesterday. Regional edema and mild regional mass effect are stable. There is a trace amount of extension now into the left occipital horn on series 4, image 15. But no other intraventricular blood identified. No subdural or subarachnoid hemorrhage identified. Stable ventricle size. Basilar cisterns remain  patent. Stable gray-white matter differentiation elsewhere. Vascular: No suspicious intracranial vascular hyperdensity. Skull: No acute osseous abnormality identified. Sinuses/Orbits: Visualized paranasal sinuses and mastoids are clear. Other: No acute orbit or scalp soft tissue findings. IMPRESSION: 1. Left occipital intra-axial hemorrhage has increased by 1-2 mL since yesterday, now about 3.4 cm (11 mL). But regional edema and mild regional mass effect are stable. 2. And there is trace new intraventricular extension into the left occipital horn. But ventricle size remains normal. Electronically Signed   By: Genevie Ann M.D.   On: 05/20/2019 16:52   CT ABDOMEN PELVIS W CONTRAST  Result Date: 05/21/2019 CLINICAL DATA:  Acute abdominal pain. EXAM: CT ABDOMEN AND PELVIS WITH CONTRAST TECHNIQUE: Multidetector CT imaging of the abdomen and pelvis was performed using the standard protocol following bolus administration of intravenous contrast. CONTRAST:  179mL OMNIPAQUE IOHEXOL 300 MG/ML  SOLN COMPARISON:  10/16/2018. FINDINGS: Lower chest: Calcified granuloma in the right lower lobe. Atelectasis in the left lower lobe, adjacent to an elevated left hemidiaphragm. Heart is enlarged. Calcification of the aortic valve. No pericardial or pleural effusion. Distal esophagus is  unremarkable. Hepatobiliary: Subcentimeter low-attenuation lesions in the liver are unchanged but too small to characterize. Cholecystectomy. No biliary ductal dilatation. Negative. Pancreas: Negative. Spleen: Negative. Adrenals/Urinary Tract: Adrenal glands are unremarkable. A 1.7 cm hyperdense lesion in the lower pole right kidney (3/39) may have enlarged slightly from 1.5 cm on 10/16/2026. A hyperdense lesion off the lower pole left kidney measures 3.5 cm (3/37), similar. A 2.4 cm exophytic low-attenuation lesion off the lower pole right kidney (3/38) is likely a cyst. Other low-attenuation lesions in the kidneys are too small to characterize. Ureters are decompressed. Bladder is grossly unremarkable although partially obscured by streak artifact from a right hip arthroplasty. Stomach/Bowel: Small hiatal hernia. Stomach is decompressed. Stomach, small bowel and colon are unremarkable. Appendix is not readily visualized. Vascular/Lymphatic: Atherosclerotic calcification of the aorta without aneurysm. No pathologically enlarged lymph nodes. Reproductive: Hysterectomy.  No adnexal mass. Other: No free fluid. An 11 mm right paramidline omental nodule (3/40) is unchanged. Mesenteries and peritoneum are otherwise unremarkable. Musculoskeletal: Right hip arthroplasty. Degenerative changes in the spine. No worrisome lytic or sclerotic lesions. IMPRESSION: 1. No acute findings to explain the patient's pain. 2. Hyperdense lesions in both kidneys cannot be characterized as simple cysts. Lesion on the right appears minimally larger. If further evaluation is desired, CT or MR without and with contrast could be performed. 3. Isolated and nonspecific right paramidline omental nodule, unchanged. 4.  Aortic atherosclerosis (ICD10-I70.0). Electronically Signed   By: Lorin Picket M.D.   On: 05/21/2019 14:26   ECHOCARDIOGRAM COMPLETE  Result Date: 05/20/2019    ECHOCARDIOGRAM REPORT   Patient Name:   [name] Date of  Exam: 05/20/2019 Medical Rec #:  [id number]         Height:       63.0 in Accession #:    [id number]        Weight:       150.0 lb Date of Birth:  02/05/1926          BSA:          75.711 m Patient Age:    61 years          BP:           128/50 mmHg Patient Gender: F                 HR:  59 bpm. Exam Location:  [facility] Procedure: 2D Echo, Cardiac Doppler and Color Doppler Indications:    Stroke 434.91 / I163.9  History:        Patient has prior history of Echocardiogram examinations, most                 recent 07/19/2018. Pacemaker, Arrythmias:Atrial Fibrillation;                 Risk Factors:Hypertension. Chronic anticoagulation,GERD, Cancer.  Sonographer:    Alvino Chapel RCS Referring Phys: [place]  1. Left ventricular ejection fraction, by estimation, is 60 to 65%. The left ventricle has normal function. The left ventricle has no regional wall motion abnormalities. Left ventricular diastolic parameters are indeterminate in the setting of atrial fibrillation.  2. Right ventricular systolic function is normal. The right ventricular size is normal. There is moderately elevated pulmonary artery systolic pressure. The estimated right ventricular systolic pressure is 123XX123 mmHg.  3. Left atrial size was severely dilated.  4. Right atrial size was moderately dilated.  5. The mitral valve is degenerative. Trivial mitral valve regurgitation.  6. The aortic valve is tricuspid. Aortic valve regurgitation is not visualized.  7. The inferior vena cava is dilated in size with >50% respiratory variability, suggesting right atrial pressure of 8 mmHg. FINDINGS  Left Ventricle: Left ventricular ejection fraction, by estimation, is 60 to 65%. The left ventricle has normal function. The left ventricle has no regional wall motion abnormalities. The left ventricular internal cavity size was normal in size. There is  borderline left ventricular hypertrophy. Left ventricular diastolic parameters are  indeterminate. Right Ventricle: The right ventricular size is normal. No increase in right ventricular wall thickness. Right ventricular systolic function is normal. There is moderately elevated pulmonary artery systolic pressure. The tricuspid regurgitant velocity is 3.00 m/s, and with an assumed right atrial pressure of 8 mmHg, the estimated right ventricular systolic pressure is 123XX123 mmHg. Left Atrium: Left atrial size was severely dilated. Right Atrium: Right atrial size was moderately dilated. Pericardium: There is no evidence of pericardial effusion. Mitral Valve: The mitral valve is degenerative in appearance. There is mild thickening of the mitral valve leaflet(s). Moderate mitral annular calcification. Trivial mitral valve regurgitation. Tricuspid Valve: The tricuspid valve is grossly normal. Tricuspid valve regurgitation is trivial. Aortic Valve: The aortic valve is tricuspid. Aortic valve regurgitation is not visualized. Mild aortic valve annular calcification. There is mild calcification of the aortic valve. Aortic valve mean gradient measures 10.0 mmHg. Aortic valve peak gradient  measures 17.8 mmHg. Aortic valve area, by VTI measures 1.96 cm. Pulmonic Valve: The pulmonic valve was grossly normal. Pulmonic valve regurgitation is trivial. Aorta: The aortic root is normal in size and structure. Venous: The inferior vena cava is dilated in size with greater than 50% respiratory variability, suggesting right atrial pressure of 8 mmHg. IAS/Shunts: No atrial level shunt detected by color flow Doppler. Additional Comments: A pacer wire is visualized.  LEFT VENTRICLE PLAX 2D LVIDd:         4.10 cm LVIDs:         2.50 cm LV PW:         0.90 cm LV IVS:        1.10 cm LVOT diam:     2.10 cm LV SV:         94.56 ml LV SV Index:   55.26 LVOT Area:     3.46 cm  LV Volumes (MOD) LV vol d,  MOD A2C: 60.2 ml LV vol d, MOD A4C: 55.9 ml LV vol s, MOD A2C: 16.4 ml LV vol s, MOD A4C: 23.6 ml LV SV MOD A2C:     43.8 ml LV  SV MOD A4C:     55.9 ml LV SV MOD BP:      38.5 ml RIGHT VENTRICLE TAPSE (M-mode): 1.9 cm LEFT ATRIUM              Index       RIGHT ATRIUM           Index LA diam:        4.00 cm  2.34 cm/m  RA Area:     25.10 cm LA Vol (A2C):   129.0 ml 75.39 ml/m RA Volume:   79.40 ml  46.40 ml/m LA Vol (A4C):   86.6 ml  50.61 ml/m LA Biplane Vol: 107.0 ml 62.53 ml/m  AORTIC VALVE AV Area (Vmax):    2.08 cm AV Area (Vmean):   2.03 cm AV Area (VTI):     1.96 cm AV Vmax:           211.00 cm/s AV Vmean:          146.000 cm/s AV VTI:            0.482 m AV Peak Grad:      17.8 mmHg AV Mean Grad:      10.0 mmHg LVOT Vmax:         127.00 cm/s LVOT Vmean:        85.500 cm/s LVOT VTI:          0.273 m LVOT/AV VTI ratio: 0.57  AORTA Ao Root diam: 3.10 cm MITRAL VALVE                TRICUSPID VALVE MV Area (PHT): 5.25 cm     TR Peak grad:   36.0 mmHg MV Decel Time: 145 msec     TR Vmax:        300.00 cm/s MV E velocity: 161.00 cm/s                             SHUNTS                             Systemic VTI:  0.27 m                             Systemic Diam: 2.10 cm Rozann Lesches MD Electronically signed by Rozann Lesches MD Signature Date/Time: 05/20/2019/2:54:19 PM    Final    CT Angio Abd/Pel w/ and/or w/o  Result Date: 05/21/2019 CLINICAL DATA:  86 year old female with concern for mesenteric ischemia. EXAM: CTA ABDOMEN AND PELVIS WITHOUT AND WITH CONTRAST TECHNIQUE: Multidetector CT imaging of the abdomen and pelvis was performed using the standard protocol during bolus administration of intravenous contrast. Multiplanar reconstructed images and MIPs were obtained and reviewed to evaluate the vascular anatomy. CONTRAST:  124mL OMNIPAQUE IOHEXOL 350 MG/ML SOLN COMPARISON:  CT abdomen pelvis dated 05/21/2019. FINDINGS: VASCULAR Aorta: Advanced atherosclerotic disease.  No aneurysm or dissection. Celiac: Patent without evidence of aneurysm, dissection, vasculitis or significant stenosis. SMA: There is advanced  atherosclerotic calcification of the origin of the SMA. There is a focal area of approximately 50% luminal narrowing of the proximal SMA. The SMA remains patent. Renals: Atherosclerotic  calcification of the origins of the renal arteries. The renal arteries are patent. IMA: Patent without evidence of aneurysm, dissection, vasculitis or significant stenosis. Inflow: Atherosclerotic calcification of the iliac arteries. The iliac arteries remain patent. Proximal Outflow: Bilateral common femoral and visualized portions of the superficial and profunda femoral arteries are patent without evidence of aneurysm, dissection, vasculitis or significant stenosis. Veins: No obvious venous abnormality within the limitations of this arterial phase study. Review of the MIP images confirms the above findings. NON-VASCULAR Lower chest: Diffuse interstitial coarsening. Left lung base atelectasis/scarring versus less likely pneumonia. Clinical correlation is recommended. There is cardiomegaly with biatrial dilatation. Coronary vascular calcification and partially visualized pacemaker wire. No intra-abdominal free air or free fluid. Hepatobiliary: Mildly irregular liver contour, likely early changes of cirrhosis. No intrahepatic biliary ductal dilatation. Cholecystectomy. No retained calcified stone in the central CBD. Pancreas: Unremarkable. No pancreatic ductal dilatation or surrounding inflammatory changes. Spleen: Normal in size without focal abnormality. Adrenals/Urinary Tract: Mild adrenal thickening/hyperplasia. There is no hydronephrosis on either side. There is symmetric enhancement and excretion of contrast by both kidneys. Bilateral renal hypodense lesions are suboptimally characterized on this CT. These appears similar to the earlier CT. Ultrasound or may provide better characterization on a nonemergent basis. The visualized ureters appear unremarkable. Trabeculated appearance of the bladder wall, likely secondary to chronic  bladder dysfunction. Stomach/Bowel: There is extensive sigmoid diverticulosis without active inflammatory changes. There is a small hiatal hernia. There is no bowel obstruction or active inflammation. Lymphatic: No adenopathy. Reproductive: Hysterectomy. Other: None Musculoskeletal: Right hip arthroplasty. No acute osseous pathology. Degenerative changes of the spine. IMPRESSION: 1. No acute intra-abdominal or pelvic pathology. 2. Sigmoid diverticulosis. No bowel obstruction or active inflammation. 3. No pneumatosis, portal venous gas, or free air. 4. Additional findings as above including Aortic Atherosclerosis (ICD10-I70.0). Electronically Signed   By: Anner Crete M.D.   On: 05/21/2019 23:12        Scheduled Meds: .  stroke: mapping our early stages of recovery book   Does not apply Once  . amLODipine  10 mg Oral q morning - 10a  . Chlorhexidine Gluconate Cloth  6 each Topical Daily  . diphenhydrAMINE  25 mg Intravenous Once  . furosemide  40 mg Oral Daily  . hydrALAZINE  50 mg Oral Q8H  . irbesartan  300 mg Oral q morning - 10a  . methimazole  5 mg Oral TID  . pantoprazole  40 mg Oral QHS  . prochlorperazine  10 mg Intravenous Once  . sodium chloride  2 g Oral BID WC   Continuous Infusions: . sodium chloride Stopped (05/21/19 1622)  . clevidipine Stopped (05/21/19 2315)     Time spent: 25 minutes with over 50% of the time coordinating the patient's care    Harold Hedge, DO [facility] Pager [phone]  Call night coverage person covering after 7pm

## 2019-05-22 NOTE — Progress Notes (Addendum)
STROKE TEAM PROGRESS NOTE   INTERVAL HISTORY RN and son at bedside.  Patient sitting in chair, awake alert, no abdominal pain, right hemianopia also improved to only right upper quadrantanopsia.  She had abdominal pain yesterday, CT abdomen pelvis and CTA abdomen pelvis unremarkable.  Had morphine 2 mg IV last night, patient abdominal pain resolved.  OBJECTIVE Vitals:   05/22/19 0400 05/22/19 0500 05/22/19 0600 05/22/19 0800  BP: (!) 148/58 (!) 151/50 (!) 139/59   Pulse: 60 (!) 59 (!) 59   Resp: 16 14 19    Temp: 98.2 F (36.8 C)   98.5 F (36.9 C)  TempSrc: Oral   Oral  SpO2: 96% 97% 95%     CBC:  Recent Labs  Lab 05/18/19 1227 05/18/19 1227 05/19/19 2010 05/21/19 0417 05/21/19 2159 05/22/19 0520  WBC 6.9   < > 8.0   < > 10.2 6.8  NEUTROABS 5.6  --  6.6  --   --   --   HGB 11.9*   < > 11.0*   < > 11.2* 10.5*  HCT 38.3   < > 35.3*   < > 34.5* 32.9*  MCV 84.2   < > 82.9   < > 80.6 81.8  PLT 301   < > 300   < > 264 257   < > = values in this interval not displayed.    Basic Metabolic Panel:  Recent Labs  Lab 05/21/19 2159 05/22/19 0520  NA 127* 131*  K 4.3 3.7  CL 94* 96*  CO2 23 23  GLUCOSE 110* 91  BUN 12 9  CREATININE 0.91 0.96  CALCIUM 8.6* 8.7*    Lipid Panel:     Component Value Date/Time   CHOL 193 05/21/2019 0417   TRIG 135 05/21/2019 1116   HDL 59 05/21/2019 0417   CHOLHDL 3.3 05/21/2019 0417   VLDL 8 05/21/2019 0417   LDLCALC 126 (H) 05/21/2019 0417   HgbA1c:  Lab Results  Component Value Date   HGBA1C 5.2 05/19/2019   Urine Drug Screen: No results found for: LABOPIA, COCAINSCRNUR, LABBENZ, AMPHETMU, THCU, LABBARB  Alcohol Level No results found for: ETH  IMAGING  CT Angio Head W or Wo Contrast CT Angio Neck W and/or Wo Contrast 05/18/2019 IMPRESSION:  1. No subarachnoid hemorrhage. Normal CT appearance of the noncontrast head.  2. Moderate tortuosity of the cervical internal carotid arteries bilaterally without significant stenosis  relative to the more distal vessels.  3. Mild bilateral carotid bifurcation atherosclerotic changes without significant stenosis.  4. Additional atherosclerotic changes at the aortic arch and within the cavernous internal carotid arteries without significant stenosis.  5. Multilevel spondylosis of the cervical spine.  6. Aortic Atherosclerosis (ICD10-I70.0).   CT Head Wo Contrast 05/19/2019 IMPRESSION:  Left occipital intraparenchymal hemorrhage, 8.7cc volume, slightly increased in size from exam yesterday. No midline shift.   CT Head WO Contrast  05/20/19 1. Left occipital intra-axial hemorrhage has increased by 1-2 mL since yesterday, now about 3.4 cm (11 mL). But regional edema and mild regional mass effect are stable. 2. And there is trace new intraventricular extension into the left occipital horn. But ventricle size remains normal.  CT Head WO Contrast  05/21/19 Stable hematoma left occipital lobe. Trace extension into the left occipital horn slightly improved. No hydrocephalus. No new hemorrhage  Transthoracic Echocardiogram  1. Left ventricular ejection fraction, by estimation, is 60 to 65%. The left ventricle has normal function. The left ventricle has no regional wall motion abnormalities.  Left ventricular diastolic parameters are indeterminate in the setting of atrial fibrillation.  2. Right ventricular systolic function is normal. The right ventricular size is normal. There is moderately elevated pulmonary artery systolic pressure. The estimated right ventricular systolic pressure is 123XX123 mmHg.  3. Left atrial size was severely dilated.  4. Right atrial size was moderately dilated.  5. The mitral valve is degenerative. Trivial mitral valve regurgitation.  6. The aortic valve is tricuspid. Aortic valve regurgitation is not visualized.  7. The inferior vena cava is dilated in size with >50% respiratory variability, suggesting right atrial pressure of 8 mmHg.   DG HIP UNILAT  WITH PELVIS 2-3 VIEWS RIGHT 05/20/2019 IMPRESSION:  Good appearance following previous right hip arthroplasty. No traumatic finding.   Chest 2 View 05/19/2019 IMPRESSION:  1. Bibasilar airspace disease favored to represent atelectasis. Small bilateral pleural effusions.  2. Cardiomegaly with mild vascular congestion.  3. Left-sided pacemaker.   CT ABDOMEN PELVIS W CONTRAST 05/21/2019 1. No acute findings to explain the patient's pain. 2. Hyperdense lesions in both kidneys cannot be characterized as simple cysts. Lesion on the right appears minimally larger. If further evaluation is desired, CT or MR without and with contrast could be performed. 3. Isolated and nonspecific right paramidline omental nodule, unchanged. 4.  Aortic atherosclerosis (ICD10-I70.0).   CT Angio Abd/Pel w/ and/or w/o 1. No acute intra-abdominal or pelvic pathology. 2. Sigmoid diverticulosis. No bowel obstruction or active inflammation. 3. No pneumatosis, portal venous gas, or free air. 4. Additional findings as above including Aortic Atherosclerosis (ICD10-I70.0).    ECG - atrial fibrillation - ventricular response 64 BPM intermittent pacing (See cardiology reading for complete details)   PHYSICAL EXAM  Temp:  [97.6 F (36.4 C)-98.5 F (36.9 C)] 98.5 F (36.9 C) (02/23 0800) Pulse Rate:  [59-127] 59 (02/23 0600) Resp:  [14-27] 19 (02/23 0600) BP: (118-177)/(45-105) 139/59 (02/23 0600) SpO2:  [89 %-100 %] 95 % (02/23 0600)  General - Well nourished, well developed, in no apparent distress.  Ophthalmologic - fundi not visualized due to noncooperation.  Cardiovascular - irregularly irregular heart rate and rhythm.  Neuro - awake alert, orientated to self, people, place, age but not orientated to time. Able to name and repeat simple sentence. No more aphasia, but still has mild dysarthria. Right upper quadrantanopia, right mild facial droop, tongue midline. PERRL, EOMI. Moving all extremities symmetrically.  Sensation and coordination intact, gait not tested.    ASSESSMENT/PLAN [name] is a 86 y.o. female with history of Hld, Htn, PPM (bradycardia), atrial fibrillation on Lovenox PTA who re-presents with severe headache found to have a medium sized left occipital lobe hemorrhage with a small amount of adjacent vasogenic edema . She did not receive IV t-PA due to hemorrhage.   Stroke:  left occipital lobe ICH in the setting of lovenox for atrial fibrillation and hypertension  CT head 2/19 - Left occipital intraparenchymal hemorrhage  CTA H&N 2/19 - No aneurysm or AVM  CT Head 2/20 - increased size at left occipital ICH  CT head repeat 05/20/19 - minimal increase of left occipital ICH with trace IVH  CT head repeat 2/22 stable hematoma no significant IVH  2D Echo EF 60-65%  [employer] Virus 2 - negative  LDL - 89  HgbA1c - 5.2  VTE prophylaxis - SCDs  Lovenox prior to admission, now on No antithrombotic. - received protamine for reversal on admission  Ongoing aggressive stroke risk factor management  Therapy recommendations:  SNF  Disposition:  Pending  Abdominal pain  Middle lower abdomen tenderness  Bladder scan negative  CT abdomen pelvis no acute finding  Concerning for stool/gas impaction  Fleet enema given with no resolution  TRH consulted for severe abdominal pain  CTA abd/pelvis no thrombotic/embolic dz  UA neg  Improved pain following morphine treatment  Consider GI consult if recurs. TRH s/o  Chronic afib  Was on Coumadin until Monday, INR 2.5  Due to planned cervical injection on Thursday, patient Coumadin switched to Lovenox twice daily dosing  However, Thursday cervical injection canceled due to inclement weather  Patient continued on Lovenox therapeutic dose and to Friday when headache started. Reversed w/ protamine on admission.  Currently on no antithrombotics due to [place]  Hypertensive emergency  Home BP meds: Avapro ;  Norvasc ; Lasix  Current BP meds: Avapro ; Norvasc ; Lasix  Add hydralazine 25->50 ->75 every 8h  Strict BP control with SBP goal < 160 mmHg  off Cleviprex now . Long-term BP goal normotensive  Hyperlipidemia  Home Lipid lowering medication: none   LDL 89, goal < 70  May consider statin at discharge  Hyponatremia  Na 133->131 - on salt tab  Off IVF  Fluid restriction to 1200cc  Other Stroke Risk Factors  Advanced age  Former cigarette smoker - quit   Other Active Problems  Code status - Full Code  Bradycardia - PPM  Anemia Hgb 11.0->9.9->11.2->10.5  Hypokalemia 3.4 -> supplement - 4.3->3.7  Hospital day # 3  This patient is critically ill due to [place], A. fib, hypertensive emergency, abdominal pain and at significant risk of neurological worsening, death form hematoma expansion, IVH, hydrocephalus, heart failure, encephalopathy. This patient's care requires constant monitoring of vital signs, hemodynamics, respiratory and cardiac monitoring, review of multiple databases, neurological assessment, discussion with family, other specialists and medical decision making of high complexity. I spent 35 minutes of neurocritical care time in the care of this patient. I had long discussion with son at bedside, updated pt current condition, treatment plan and potential prognosis, and answered all the questions. He expressed understanding and appreciation.    Denise Hawking, MD PhD Stroke Neurology 05/22/2019 11:25 AM    To contact Stroke Continuity provider, please refer to [email or web address] After hours, contact General Neurology

## 2019-05-22 NOTE — Evaluation (Signed)
Physical Therapy Evaluation Patient Details Name: [name] MRN: [id number] DOB: February 18, 1926 Today's Date: 05/22/2019   History of Present Illness  Pt is a 86 yo female admitted with c/o headache and visual changes.  PT found o have a L occipital ICH.  Pt with h/o cataracts, B TKRs, back surgery and pacemaker.  Clinical Impression  Patient seen with nursing present due to assist from using bedpan.  Her abdominal pain is gone and she is mobilizing today at min A level up to walk to door and back to recliner.  She demonstrates decreased balance, decreased safety awareness, generalized weakness and will benefit from skilled PT in the acute setting to maximize mobility with safety and independence.  She will need STSNF level rehab prior to d/c home with new R side visual deficits and decreased balance, high fall risk and lives alone.      Follow Up Recommendations SNF;Supervision/Assistance - 24 hour    Equipment Recommendations  Rolling walker with 5" wheels    Recommendations for Other Services       Precautions / Restrictions Precautions Precautions: Fall Precaution Comments: Pt with R hemianopsia      Mobility  Bed Mobility Overal bed mobility: Needs Assistance Bed Mobility: Supine to Sit;Rolling Rolling: Min assist   Supine to sit: Min assist;HOB elevated     General bed mobility comments: initially rolling for hygiene after using bedpan, pulled up with hand hold A to sit, assist for lines  Transfers Overall transfer level: Needs assistance Equipment used: Rolling walker (2 wheeled) Transfers: Sit to/from Stand Sit to Stand: Min assist         General transfer comment: cues for technique assist with RW for balance/safety  Ambulation/Gait Ambulation/Gait assistance: Min assist Gait Distance (Feet): 20 Feet Assistive device: Rolling walker (2 wheeled) Gait Pattern/deviations: Step-through pattern;Decreased stride length;Wide base of support     General Gait  Details: at times too fast for having to maneuver around obstacles in the room; assist to steer walker due to R visual field cut  Stairs            Wheelchair Mobility    Modified Rankin (Stroke Patients Only)       Balance Overall balance assessment: Needs assistance Sitting-balance support: Feet supported Sitting balance-Leahy Scale: Fair     Standing balance support: Bilateral upper extremity supported Standing balance-Leahy Scale: Poor Standing balance comment: reliant on RW for balance                             Pertinent Vitals/Pain Pain Assessment: No/denies pain    Home Living Family/patient expects to be discharged to:: Private residence Living Arrangements: Alone;Other (Comment)(son lives next door)   Type of Home: House Home Access: Stairs to enter Entrance Stairs-Rails: None Entrance Stairs-Number of Steps: 2 Home Layout: One level;Laundry or work area in [place]: [name] - single point;Walker - 2 wheels;Shower seat Additional Comments: Pt has stopped driving and cooking. Family brings her groceries and meals.  Also has meals on wheels.     Prior Function Level of Independence: Needs assistance   Gait / Transfers Assistance Needed: uses a cane for gait both inside the home and for community access.   ADL's / Homemaking Assistance Needed: granddaughter does the cleaning, others cook  Comments: was active in church precovid.     Hand Dominance        Extremity/Trunk Assessment   Upper Extremity Assessment Upper  Extremity Assessment: Defer to OT evaluation    Lower Extremity Assessment Lower Extremity Assessment: Generalized weakness    Cervical / Trunk Assessment Cervical / Trunk Assessment: Normal  Communication   Communication: HOH  Cognition Arousal/Alertness: Awake/alert Behavior During Therapy: WFL for tasks assessed/performed Overall Cognitive Status: Impaired/Different from baseline                                  General Comments: slowed mentation, aware of soiled with BM after using bed pan and appropriately requesting help to clean, but threw off her gown when soiled with IV leaking      General Comments      Exercises     Assessment/Plan    PT Assessment Patient needs continued PT services  PT Problem List Decreased strength;Decreased mobility;Decreased cognition;Decreased balance;Decreased knowledge of use of DME;Decreased knowledge of precautions;Decreased safety awareness       PT Treatment Interventions DME instruction;Balance training;Therapeutic activities;Gait training;Functional mobility training;Therapeutic exercise;Patient/family education    PT Goals (Current goals can be found in the Care Plan section)  Acute Rehab PT Goals Patient Stated Goal: to get better PT Goal Formulation: With patient Time For Goal Achievement: 06/05/19 Potential to Achieve Goals: Fair    Frequency Min 3X/week   Barriers to discharge        Co-evaluation               AM-PAC PT "6 Clicks" Mobility  Outcome Measure Help needed turning from your back to your side while in a flat bed without using bedrails?: A Little Help needed moving from lying on your back to sitting on the side of a flat bed without using bedrails?: A Little Help needed moving to and from a bed to a chair (including a wheelchair)?: A Little Help needed standing up from a chair using your arms (e.g., wheelchair or bedside chair)?: A Little Help needed to walk in hospital room?: A Little Help needed climbing 3-5 steps with a railing? : A Little 6 Click Score: 18    End of Session   Activity Tolerance: Patient tolerated treatment well Patient left: in chair;with call bell/phone within reach;with chair alarm set;with nursing/sitter in room Nurse Communication: Mobility status PT Visit Diagnosis: Other abnormalities of gait and mobility (R26.89);History of falling (Z91.81);Muscle weakness  (generalized) (M62.81)    Time: 0947-1000 PT Time Calculation (min) (ACUTE ONLY): 13 min   Charges:   PT Evaluation $PT Eval Moderate Complexity: [place], [place] Acute Rehabilitation Services [phone] 05/22/2019   [name] 05/22/2019, 2:32 PM

## 2019-05-23 DIAGNOSIS — I619 Nontraumatic intracerebral hemorrhage, unspecified: Secondary | ICD-10-CM

## 2019-05-23 DIAGNOSIS — K5900 Constipation, unspecified: Secondary | ICD-10-CM

## 2019-05-23 LAB — CBC
HCT: 32.6 % — ABNORMAL LOW (ref 36.0–46.0)
Hemoglobin: 10.3 g/dL — ABNORMAL LOW (ref 12.0–15.0)
MCH: 26 pg (ref 26.0–34.0)
MCHC: 31.6 g/dL (ref 30.0–36.0)
MCV: 82.3 fL (ref 80.0–100.0)
Platelets: 271 10*3/uL (ref 150–400)
RBC: 3.96 MIL/uL (ref 3.87–5.11)
RDW: 23.7 % — ABNORMAL HIGH (ref 11.5–15.5)
WBC: 7.4 10*3/uL (ref 4.0–10.5)
nRBC: 0 % (ref 0.0–0.2)

## 2019-05-23 LAB — BASIC METABOLIC PANEL
Anion gap: 8 (ref 5–15)
BUN: 13 mg/dL (ref 8–23)
CO2: 26 mmol/L (ref 22–32)
Calcium: 8.6 mg/dL — ABNORMAL LOW (ref 8.9–10.3)
Chloride: 97 mmol/L — ABNORMAL LOW (ref 98–111)
Creatinine, Ser: 0.95 mg/dL (ref 0.44–1.00)
GFR calc Af Amer: >60 mL/min (ref >60)
GFR calc non Af Amer: 52 mL/min — ABNORMAL LOW (ref >60)
Glucose, Bld: 93 mg/dL (ref 70–99)
Potassium: 3.5 mmol/L (ref 3.5–5.1)
Sodium: 131 mmol/L — ABNORMAL LOW (ref 135–145)

## 2019-05-23 LAB — SARS CORONAVIRUS 2 (TAT 6-24 HRS): SARS Coronavirus 2: NEGATIVE

## 2019-05-23 MED ORDER — POLYETHYLENE GLYCOL 3350 17 G PO PACK: 17.0000 g | PACK | Freq: Every day | ORAL | Status: DC | PRN

## 2019-05-23 NOTE — TOC Initial Note (Addendum)
Transition of Care [facility]) - Initial/Assessment Note    Patient Details  Name: SHAKEEMA LIPPMAN MRN: [id number] Date of Birth: Jul 05, 1925  Transition of [facility]) CM/SW Contact:    Geralynn Ochs, LCSW Phone Number: 05/23/2019, 11:49 AM  Clinical Narrative:    CSW met with patient and son at bedside to discuss recommendation for SNF. Patient and son agreeable to SNF, possible preference for [facility] in [place] as patient has been there previously. Family would also like to review other options available, CSW to provide CMS list for patient choice. CSW completed referral and will follow for bed offers.               UPDATE: CSW provided CMS list to patient and son, and that [facility] would have a bed. Patient's son asked for a few minutes to speak with his family, and then would let CSW know. CSW went back again to see about final decision and family has chosen [facility]. CSW to initiate insurance authorization through [facility] for possible admission to [employer]. COVID test ordered by MD.    Expected Discharge Plan: Skilled Nursing Facility Barriers to Discharge: Continued Medical Work up, Ship broker   Patient Goals and CMS Choice Patient states their goals for this hospitalization and ongoing recovery are:: to get back home CMS Medicare.gov Compare Post Acute Care list provided to:: Patient Choice offered to / list presented to : Patient  Expected Discharge Plan and Services Expected Discharge Plan: [place] Choice: [place] Living arrangements for the past 2 months: Single Family Home                                      Prior Living Arrangements/Services Living arrangements for the past 2 months: Single Family Home Lives with:: Self Patient language and need for interpreter reviewed:: No Do you feel safe going back to the place where you live?: Yes      Need for Family Participation in Patient Care: No  (Comment) Care giver support system in place?: No (comment) Current home services: DME Criminal Activity/Legal Involvement Pertinent to Current Situation/Hospitalization: No - Comment as needed  Activities of Daily Living      Permission Sought/Granted Permission sought to share information with : Facility Sport and exercise psychologist, Family Supports Permission granted to share information with : Yes, Verbal Permission Granted  Share Information with NAME: Elmer Sow  Permission granted to share info w AGENCY: SNF  Permission granted to share info w Relationship: Sons     Emotional Assessment Appearance:: Appears stated age Attitude/Demeanor/Rapport: Engaged Affect (typically observed): Pleasant Orientation: : Oriented to Self, Oriented to Place, Oriented to  Time, Oriented to Situation Alcohol / Substance Use: Not Applicable Psych Involvement: No (comment)  Admission diagnosis:  ICH (intracerebral hemorrhage) ([place]) [I61.9] Intraparenchymal hemorrhage of brain [facility]) [I61.9] Patient Active Problem List   Diagnosis Date Noted  . Abdominal pain 05/21/2019  . ICH (intracerebral hemorrhage) ([place]) 05/19/2019  . V tach ([place]) 08/09/2018  . Intractable nausea and vomiting 08/09/2018  . Hyponatremia 08/09/2018  . Hypomagnesemia 08/09/2018  . Hyperthyroidism 08/09/2018  . Epigastric pain   . Nonspecific chest pain 07/18/2018  . Anxiety 07/18/2018  . Insomnia 07/18/2018  . Complication of anesthesia   . Intractable headache 01/06/2017  . Intractable pain 01/04/2017  . Chronic anticoagulation 01/03/2017  . Neck pain 01/03/2017  .  Acute bronchitis 06/08/2016  . Fall 09/24/2015  . Dizziness 09/24/2015  . Postoperative anemia due to acute blood loss 12/27/2013  . Postop Transfusion 12/27/2013  . OA (osteoarthritis) of hip 12/24/2013  . Preop cardiovascular exam 09/25/2013  . Cardiac pacemaker in situ 08/29/2008  . Essential hypertension 08/21/2008  . ATRIAL FIBRILLATION 08/21/2008   . Bradycardia 08/21/2008  . PNEUMONIA 08/21/2008  . GERD 08/21/2008  . NEPHROLITHIASIS 08/21/2008  . Osteoarthritis 08/21/2008   PCP:  [name], MD Pharmacy:   [place] [place] [address] Phone: [phone] Fax: [phone]  [facility]Phone: [phone]: [phone] [facility] [place]- [address]Phone: [phone]: [phone] [facility] [address]Phone: [phone]: [phone]    Social Determinants of Health (SDOH) Interventions    Readmission Risk Interventions No flowsheet data found.

## 2019-05-23 NOTE — Progress Notes (Signed)
[facility] Consult PROGRESS NOTE    Progress Note  [name]  [id number] DOB: 1925/12/13 DOA: 05/19/2019 PCP: [name], MD     Brief Narrative:   [name] is an 86 y.o. female past medical history of A. fib on Coumadin, bradycardia status post pacemaker placement, GERD nephrolithiasis, essential hypertension history of appendectomy and cholecystectomy admitted under the neurology service in the ICU for severe headache secondary to left occipital lobe intracranial hemorrhage in the setting of A. fib and anticoagulation accompanied by hypertensive urgency.  Anticoagulation was stopped patient complained of acute severe abdominal pain CT scan of the abdomen and pelvis was done that revealed no acute findings.  Her abdominal pain was attributed to stool" was given several Fleet enemas but despite this the pain continued, tried was consulted for management of her abdominal pain.  Assessment/Plan:   Severe abdominal pain likely secondary to constipation versus less likely nonocclusive mesenteric ischemia: CT scan of the abdomen and pelvis without any signs of thromboembolic disease. Blood pressure has fluctuated between hypertensive crisis and hypotensive but not for long periods of times. Unlikely nonocclusive mesenteric ischemia. Her abdominal pain has significantly improved she has had multiple bowel movements. We will continue to follow at a distance continue morphine and MiraLAX as needed.  Left occipital lobe intracranial hemorrhage in the setting of atrial fibrillation on Coumadin: Further management per neurology.  Hypertensive emergency Hyperlipidemia Hyponatremia Bradycardia status post pacemaker Normocytic anemia Hypokalemia  DVT prophylaxis: SCD Family Communication:son Disposition Plan/Barrier to D/C:   Code Status:     Code Status Orders  (From admission, onward)         Start     Ordered   05/19/19 2258  Full code  Continuous      05/19/19 2304        Code Status History    Date Active Date Inactive Code Status Order ID Comments User Context   08/09/2018 0048 08/11/2018 1857 Full Code [id number]  Ivor Costa, MD ED   07/18/2018 1743 07/19/2018 1740 DNR [id number]  Anda Latina, MD Inpatient   01/06/2017 2033 01/10/2017 1837 DNR [id number]  Elodia Florence., MD Inpatient   01/04/2017 0245 01/04/2017 1651 Full Code [id number]  Toy Baker, MD Inpatient   06/08/2016 0326 06/15/2016 2120 Full Code [id number]  Rise Patience, MD Inpatient   09/24/2015 0533 09/25/2015 1737 Full Code [id number]  Etta Quill, DO Inpatient   12/24/2013 1654 12/27/2013 1637 Full Code [id number]  Gearlean Alf, MD Inpatient   Advance Care Planning Activity        IV Access:    Peripheral IV   Procedures and diagnostic studies:   CT HEAD WO CONTRAST  Result Date: 05/21/2019 CLINICAL DATA:  Cerebral hemorrhage EXAM: CT HEAD WITHOUT CONTRAST TECHNIQUE: Contiguous axial images were obtained from the base of the skull through the vertex without intravenous contrast. COMPARISON:  05/20/2019 FINDINGS: Brain: Left occipital high-density hemorrhage measures 31 x 21 mm, unchanged. Trace extension in the left occipital horn slightly improved. No new area of hemorrhage or mass. Ventricle size normal. No hydrocephalus. No acute ischemic infarct. Motion degraded study. Vascular: Negative for hyperdense vessel Skull: Negative Sinuses/Orbits: Paranasal sinuses clear. Bilateral cataract extraction. Other: None IMPRESSION: Stable hematoma left occipital lobe. Trace extension into the left occipital horn slightly improved. No hydrocephalus. No new hemorrhage Motion degraded study. Electronically Signed   By: Franchot Gallo M.D.   On: 05/21/2019 14:22   CT ABDOMEN PELVIS W CONTRAST  Result Date: 05/21/2019 CLINICAL DATA:  Acute abdominal pain. EXAM: CT ABDOMEN AND PELVIS WITH CONTRAST TECHNIQUE: Multidetector CT imaging of the abdomen and pelvis  was performed using the standard protocol following bolus administration of intravenous contrast. CONTRAST:  159mL OMNIPAQUE IOHEXOL 300 MG/ML  SOLN COMPARISON:  10/16/2018. FINDINGS: Lower chest: Calcified granuloma in the right lower lobe. Atelectasis in the left lower lobe, adjacent to an elevated left hemidiaphragm. Heart is enlarged. Calcification of the aortic valve. No pericardial or pleural effusion. Distal esophagus is unremarkable. Hepatobiliary: Subcentimeter low-attenuation lesions in the liver are unchanged but too small to characterize. Cholecystectomy. No biliary ductal dilatation. Negative. Pancreas: Negative. Spleen: Negative. Adrenals/Urinary Tract: Adrenal glands are unremarkable. A 1.7 cm hyperdense lesion in the lower pole right kidney (3/39) may have enlarged slightly from 1.5 cm on 10/16/2026. A hyperdense lesion off the lower pole left kidney measures 3.5 cm (3/37), similar. A 2.4 cm exophytic low-attenuation lesion off the lower pole right kidney (3/38) is likely a cyst. Other low-attenuation lesions in the kidneys are too small to characterize. Ureters are decompressed. Bladder is grossly unremarkable although partially obscured by streak artifact from a right hip arthroplasty. Stomach/Bowel: Small hiatal hernia. Stomach is decompressed. Stomach, small bowel and colon are unremarkable. Appendix is not readily visualized. Vascular/Lymphatic: Atherosclerotic calcification of the aorta without aneurysm. No pathologically enlarged lymph nodes. Reproductive: Hysterectomy.  No adnexal mass. Other: No free fluid. An 11 mm right paramidline omental nodule (3/40) is unchanged. Mesenteries and peritoneum are otherwise unremarkable. Musculoskeletal: Right hip arthroplasty. Degenerative changes in the spine. No worrisome lytic or sclerotic lesions. IMPRESSION: 1. No acute findings to explain the patient's pain. 2. Hyperdense lesions in both kidneys cannot be characterized as simple cysts. Lesion on the  right appears minimally larger. If further evaluation is desired, CT or MR without and with contrast could be performed. 3. Isolated and nonspecific right paramidline omental nodule, unchanged. 4.  Aortic atherosclerosis (ICD10-I70.0). Electronically Signed   By: Lorin Picket M.D.   On: 05/21/2019 14:26   CT Angio Abd/Pel w/ and/or w/o  Result Date: 05/21/2019 CLINICAL DATA:  86 year old female with concern for mesenteric ischemia. EXAM: CTA ABDOMEN AND PELVIS WITHOUT AND WITH CONTRAST TECHNIQUE: Multidetector CT imaging of the abdomen and pelvis was performed using the standard protocol during bolus administration of intravenous contrast. Multiplanar reconstructed images and MIPs were obtained and reviewed to evaluate the vascular anatomy. CONTRAST:  123mL OMNIPAQUE IOHEXOL 350 MG/ML SOLN COMPARISON:  CT abdomen pelvis dated 05/21/2019. FINDINGS: VASCULAR Aorta: Advanced atherosclerotic disease.  No aneurysm or dissection. Celiac: Patent without evidence of aneurysm, dissection, vasculitis or significant stenosis. SMA: There is advanced atherosclerotic calcification of the origin of the SMA. There is a focal area of approximately 50% luminal narrowing of the proximal SMA. The SMA remains patent. Renals: Atherosclerotic calcification of the origins of the renal arteries. The renal arteries are patent. IMA: Patent without evidence of aneurysm, dissection, vasculitis or significant stenosis. Inflow: Atherosclerotic calcification of the iliac arteries. The iliac arteries remain patent. Proximal Outflow: Bilateral common femoral and visualized portions of the superficial and profunda femoral arteries are patent without evidence of aneurysm, dissection, vasculitis or significant stenosis. Veins: No obvious venous abnormality within the limitations of this arterial phase study. Review of the MIP images confirms the above findings. NON-VASCULAR Lower chest: Diffuse interstitial coarsening. Left lung base  atelectasis/scarring versus less likely pneumonia. Clinical correlation is recommended. There is cardiomegaly with biatrial dilatation. Coronary vascular calcification and partially visualized pacemaker wire. No intra-abdominal  free air or free fluid. Hepatobiliary: Mildly irregular liver contour, likely early changes of cirrhosis. No intrahepatic biliary ductal dilatation. Cholecystectomy. No retained calcified stone in the central CBD. Pancreas: Unremarkable. No pancreatic ductal dilatation or surrounding inflammatory changes. Spleen: Normal in size without focal abnormality. Adrenals/Urinary Tract: Mild adrenal thickening/hyperplasia. There is no hydronephrosis on either side. There is symmetric enhancement and excretion of contrast by both kidneys. Bilateral renal hypodense lesions are suboptimally characterized on this CT. These appears similar to the earlier CT. Ultrasound or may provide better characterization on a nonemergent basis. The visualized ureters appear unremarkable. Trabeculated appearance of the bladder wall, likely secondary to chronic bladder dysfunction. Stomach/Bowel: There is extensive sigmoid diverticulosis without active inflammatory changes. There is a small hiatal hernia. There is no bowel obstruction or active inflammation. Lymphatic: No adenopathy. Reproductive: Hysterectomy. Other: None Musculoskeletal: Right hip arthroplasty. No acute osseous pathology. Degenerative changes of the spine. IMPRESSION: 1. No acute intra-abdominal or pelvic pathology. 2. Sigmoid diverticulosis. No bowel obstruction or active inflammation. 3. No pneumatosis, portal venous gas, or free air. 4. Additional findings as above including Aortic Atherosclerosis (ICD10-I70.0). Electronically Signed   By: Anner Crete M.D.   On: 05/21/2019 23:12     Medical Consultants:    None.  Anti-Infectives:   None  Subjective:    [name] no complaints today.  Objective:    Vitals:   05/22/19  2357 05/23/19 0328 05/23/19 0616 05/23/19 0837  BP: (!) 113/41 134/61 138/62 (!) 125/54  Pulse: 64 60 61 68  Resp: 16 16 17 18   Temp: 98.7 F (37.1 C) 98.2 F (36.8 C) 98.2 F (36.8 C) 98.1 F (36.7 C)  TempSrc: Oral Oral Oral Oral  SpO2: 92% 93% 95% 99%   SpO2: 99 % O2 Flow Rate (L/min): 2 L/min   Intake/Output Summary (Last 24 hours) at 05/23/2019 1000 Last data filed at 05/23/2019 [id number] Gross per 24 hour  Intake 444.31 ml  Output 1000 ml  Net -555.69 ml   There were no vitals filed for this visit.  Exam: General exam: In no acute distress. Respiratory system: Good air movement and clear to auscultation. Cardiovascular system: S1 & S2 heard, RRR. No JVD. Gastrointestinal system: Abdomen is nondistended, soft and nontender.  Central nervous system: Alert and oriented. No focal neurological deficits. Extremities: No pedal edema. Skin: No rashes, lesions or ulcers   Data Reviewed:    Labs: Basic Metabolic Panel: Recent Labs  Lab 05/19/19 2010 05/19/19 2010 05/21/19 0417 05/21/19 0417 05/21/19 2159 05/21/19 2159 05/22/19 0520 05/23/19 0637  NA 133*  --  131*  --  127*  --  131* 131*  K 3.8   < > 3.4*   < > 4.3   < > 3.7 3.5  CL 98  --  94*  --  94*  --  96* 97*  CO2 24  --  27  --  23  --  23 26  GLUCOSE 110*  --  99  --  110*  --  91 93  BUN 8  --  10  --  12  --  9 13  CREATININE 0.80  --  0.91  --  0.91  --  0.96 0.95  CALCIUM 8.9  --  8.6*  --  8.6*  --  8.7* 8.6*   < > = values in this interval not displayed.   GFR Estimated Creatinine Clearance: 35.7 mL/min (by C-G formula based on SCr of 0.95 mg/dL). Liver Function Tests:  Recent Labs  Lab 05/21/19 2159  AST 26  ALT 22  ALKPHOS 84  BILITOT 0.8  PROT 6.0*  ALBUMIN 3.4*   No results for input(s): LIPASE, AMYLASE in the last 168 hours. No results for input(s): AMMONIA in the last 168 hours. Coagulation profile Recent Labs  Lab 05/18/19 1227 05/19/19 2351  INR 1.0 1.0   COVID-19  Labs  No results for input(s): DDIMER, FERRITIN, LDH, CRP in the last 72 hours.  Lab Results  Component Value Date   SARSCOV2NAA NEGATIVE 05/19/2019   [place] NEGATIVE 10/16/2018   [place] NEGATIVE 08/08/2018    CBC: Recent Labs  Lab 05/18/19 1227 05/18/19 1227 05/19/19 2010 05/21/19 0417 05/21/19 2159 05/22/19 0520 05/23/19 0637  WBC 6.9   < > 8.0 7.9 10.2 6.8 7.4  NEUTROABS 5.6  --  6.6  --   --   --   --   HGB 11.9*   < > 11.0* 9.9* 11.2* 10.5* 10.3*  HCT 38.3   < > 35.3* 30.9* 34.5* 32.9* 32.6*  MCV 84.2   < > 82.9 83.3 80.6 81.8 82.3  PLT 301   < > 300 255 264 257 271   < > = values in this interval not displayed.   Cardiac Enzymes: No results for input(s): CKTOTAL, CKMB, CKMBINDEX, TROPONINI in the last 168 hours. BNP (last 3 results) No results for input(s): PROBNP in the last 8760 hours. CBG: No results for input(s): GLUCAP in the last 168 hours. D-Dimer: No results for input(s): DDIMER in the last 72 hours. Hgb A1c: No results for input(s): HGBA1C in the last 72 hours. Lipid Profile: Recent Labs    05/21/19 0417 05/21/19 1116  CHOL 193  --   HDL 59  --   LDLCALC 126*  --   TRIG 40 135  CHOLHDL 3.3  --    Thyroid function studies: No results for input(s): TSH, T4TOTAL, T3FREE, THYROIDAB in the last 72 hours.  Invalid input(s): FREET3 Anemia work up: No results for input(s): VITAMINB12, FOLATE, FERRITIN, TIBC, IRON, RETICCTPCT in the last 72 hours. Sepsis Labs: Recent Labs  Lab 05/21/19 0417 05/21/19 1116 05/21/19 1441 05/21/19 2159 05/22/19 0520 05/23/19 0637  WBC 7.9  --   --  10.2 6.8 7.4  LATICACIDVEN  --  1.9 1.3 1.0  --   --    Microbiology Recent Results (from the past 240 hour(s))  SARS CORONAVIRUS 2 (TAT 6-24 HRS) Nasopharyngeal Nasopharyngeal Swab     Status: None   Collection Time: 05/19/19  8:47 PM   Specimen: Nasopharyngeal Swab  Result Value Ref Range Status   SARS Coronavirus 2 NEGATIVE NEGATIVE Final    Comment:  (NOTE) SARS-CoV-2 target nucleic acids are NOT DETECTED. The SARS-CoV-2 RNA is generally detectable in upper and lower respiratory specimens during the acute phase of infection. Negative results do not preclude SARS-CoV-2 infection, do not rule out co-infections with other pathogens, and should not be used as the sole basis for treatment or other patient management decisions. Negative results must be combined with clinical observations, patient history, and epidemiological information. The expected result is Negative. Fact Sheet for Patients: SugarRoll.be Fact Sheet for Healthcare Providers: https://www.woods-mathews.com/ This test is not yet approved or cleared by the [place] FDA and  has been authorized for detection and/or diagnosis of SARS-CoV-2 by FDA under an Emergency Use Authorization (EUA). This EUA will remain  in effect (meaning this test can be used) for the duration of the COVID-19 declaration under Section  56 4(b)(1) of the Act, 21 U.S.C. section 360bbb-3(b)(1), unless the authorization is terminated or revoked sooner. Performed at [facility], [place] [address], [place], [place] [address]   MRSA PCR Screening     Status: None   Collection Time: 05/20/19  1:39 AM   Specimen: Nasal Mucosa; Nasopharyngeal  Result Value Ref Range Status   MRSA by PCR NEGATIVE NEGATIVE Final    Comment:        The GeneXpert MRSA Assay (FDA approved for NASAL specimens only), is one component of a comprehensive MRSA colonization surveillance program. It is not intended to diagnose MRSA infection nor to guide or monitor treatment for MRSA infections. Performed at [facility], [place] [address] [place], [place] [address]      Medications:   .  stroke: mapping our early stages of recovery book   Does not apply Once  . amLODipine  10 mg Oral q morning - 10a  . furosemide  40 mg Oral Daily  . hydrALAZINE  75 mg Oral Q8H  .  irbesartan  300 mg Oral q morning - 10a  . methimazole  5 mg Oral TID  . pantoprazole  40 mg Oral QHS  . sodium chloride  2 g Oral BID WC   Continuous Infusions:    LOS: 4 days   Charlynne Cousins  [facility]  05/23/2019, 10:00 AM

## 2019-05-23 NOTE — Progress Notes (Signed)
STROKE TEAM PROGRESS NOTE   INTERVAL HISTORY Patient sitting in bed, awake alert, no complaints, no abdominal pain, no headache.  Tolerating diet pretty good, will advance diet to heart healthy diet.  Right hemianopia again improved.  Pending SNF.  OBJECTIVE Vitals:   05/23/19 0616 05/23/19 0837 05/23/19 1023 05/23/19 1304  BP: 138/62 (!) 125/54 (!) 120/55 129/63  Pulse: 61 68 70 71  Resp: 17 18  18   Temp: 98.2 F (36.8 C) 98.1 F (36.7 C)  (!) 97.4 F (36.3 C)  TempSrc: Oral Oral  Oral  SpO2: 95% 99%  97%    CBC:  Recent Labs  Lab 05/18/19 1227 05/18/19 1227 05/19/19 2010 05/21/19 0417 05/22/19 0520 05/23/19 0637  WBC 6.9   < > 8.0   < > 6.8 7.4  NEUTROABS 5.6  --  6.6  --   --   --   HGB 11.9*   < > 11.0*   < > 10.5* 10.3*  HCT 38.3   < > 35.3*   < > 32.9* 32.6*  MCV 84.2   < > 82.9   < > 81.8 82.3  PLT 301   < > 300   < > 257 271   < > = values in this interval not displayed.    Basic Metabolic Panel:  Recent Labs  Lab 05/22/19 0520 05/23/19 0637  NA 131* 131*  K 3.7 3.5  CL 96* 97*  CO2 23 26  GLUCOSE 91 93  BUN 9 13  CREATININE 0.96 0.95  CALCIUM 8.7* 8.6*    Lipid Panel:     Component Value Date/Time   CHOL 193 05/21/2019 0417   TRIG 135 05/21/2019 1116   HDL 59 05/21/2019 0417   CHOLHDL 3.3 05/21/2019 0417   VLDL 8 05/21/2019 0417   LDLCALC 126 (H) 05/21/2019 0417   HgbA1c:  Lab Results  Component Value Date   HGBA1C 5.2 05/19/2019   Urine Drug Screen: No results found for: LABOPIA, COCAINSCRNUR, LABBENZ, AMPHETMU, THCU, LABBARB  Alcohol Level No results found for: ETH  IMAGING  CT Angio Head W or Wo Contrast CT Angio Neck W and/or Wo Contrast 05/18/2019 IMPRESSION:  1. No subarachnoid hemorrhage. Normal CT appearance of the noncontrast head.  2. Moderate tortuosity of the cervical internal carotid arteries bilaterally without significant stenosis relative to the more distal vessels.  3. Mild bilateral carotid bifurcation  atherosclerotic changes without significant stenosis.  4. Additional atherosclerotic changes at the aortic arch and within the cavernous internal carotid arteries without significant stenosis.  5. Multilevel spondylosis of the cervical spine.  6. Aortic Atherosclerosis (ICD10-I70.0).   CT Head Wo Contrast 05/19/2019 IMPRESSION:  Left occipital intraparenchymal hemorrhage, 8.7cc volume, slightly increased in size from exam yesterday. No midline shift.   CT Head WO Contrast  05/20/19 1. Left occipital intra-axial hemorrhage has increased by 1-2 mL since yesterday, now about 3.4 cm (11 mL). But regional edema and mild regional mass effect are stable. 2. And there is trace new intraventricular extension into the left occipital horn. But ventricle size remains normal.  CT Head WO Contrast  05/21/19 Stable hematoma left occipital lobe. Trace extension into the left occipital horn slightly improved. No hydrocephalus. No new hemorrhage  Transthoracic Echocardiogram  1. Left ventricular ejection fraction, by estimation, is 60 to 65%. The left ventricle has normal function. The left ventricle has no regional wall motion abnormalities. Left ventricular diastolic parameters are indeterminate in the setting of atrial fibrillation.  2. Right ventricular  systolic function is normal. The right ventricular size is normal. There is moderately elevated pulmonary artery systolic pressure. The estimated right ventricular systolic pressure is 123XX123 mmHg.  3. Left atrial size was severely dilated.  4. Right atrial size was moderately dilated.  5. The mitral valve is degenerative. Trivial mitral valve regurgitation.  6. The aortic valve is tricuspid. Aortic valve regurgitation is not visualized.  7. The inferior vena cava is dilated in size with >50% respiratory variability, suggesting right atrial pressure of 8 mmHg.   DG HIP UNILAT WITH PELVIS 2-3 VIEWS RIGHT 05/20/2019 IMPRESSION:  Good appearance  following previous right hip arthroplasty. No traumatic finding.   Chest 2 View 05/19/2019 IMPRESSION:  1. Bibasilar airspace disease favored to represent atelectasis. Small bilateral pleural effusions.  2. Cardiomegaly with mild vascular congestion.  3. Left-sided pacemaker.   CT ABDOMEN PELVIS W CONTRAST 05/21/2019 1. No acute findings to explain the patient's pain. 2. Hyperdense lesions in both kidneys cannot be characterized as simple cysts. Lesion on the right appears minimally larger. If further evaluation is desired, CT or MR without and with contrast could be performed. 3. Isolated and nonspecific right paramidline omental nodule, unchanged. 4.  Aortic atherosclerosis (ICD10-I70.0).   CT Angio Abd/Pel w/ and/or w/o 1. No acute intra-abdominal or pelvic pathology. 2. Sigmoid diverticulosis. No bowel obstruction or active inflammation. 3. No pneumatosis, portal venous gas, or free air. 4. Additional findings as above including Aortic Atherosclerosis (ICD10-I70.0).    ECG - atrial fibrillation - ventricular response 64 BPM intermittent pacing (See cardiology reading for complete details)   PHYSICAL EXAM  Temp:  [97.4 F (36.3 C)-98.7 F (37.1 C)] 97.4 F (36.3 C) (02/24 1304) Pulse Rate:  [60-71] 71 (02/24 1304) Resp:  [14-23] 18 (02/24 1304) BP: (104-148)/(41-82) 129/63 (02/24 1304) SpO2:  [92 %-99 %] 97 % (02/24 1304)  General - Well nourished, well developed, in no apparent distress.  Ophthalmologic - fundi not visualized due to noncooperation.  Cardiovascular - irregularly irregular heart rate and rhythm.  Neuro - awake alert, orientated to self, people, place, age but not orientated to time. Able to name and repeat simple sentence. No more aphasia, but still has mild dysarthria.  Visual field full, however, right side visual acuity decreased than left. right mild facial droop, tongue midline. PERRL, EOMI. Moving all extremities symmetrically. Sensation and coordination  intact, gait not tested.    ASSESSMENT/PLAN Ms. NATOYA RASH is a 86 y.o. female with history of Hld, Htn, PPM (bradycardia), atrial fibrillation on Lovenox PTA who re-presents with severe headache found to have a medium sized left occipital lobe hemorrhage with a small amount of adjacent vasogenic edema . She did not receive IV t-PA due to hemorrhage.   Stroke:  left occipital lobe ICH in the setting of lovenox for atrial fibrillation and hypertension  CT head 2/19 - Left occipital intraparenchymal hemorrhage  CTA H&N 2/19 - No aneurysm or AVM  CT Head 2/20 - increased size at left occipital ICH  CT head repeat 05/20/19 - minimal increase of left occipital ICH with trace IVH  CT head repeat 2/22 stable hematoma no significant IVH  2D Echo EF 60-65%  [employer] Virus 2 - negative  LDL - 89  HgbA1c - 5.2  VTE prophylaxis - SCDs  Lovenox prior to admission, now on No antithrombotic. - received protamine for reversal on admission  Ongoing aggressive stroke risk factor management  Therapy recommendations:  SNF  Disposition:  Pending  Medically read for  d/c once bed found  COVID ordered in anticipation of d/c tomorrow  Abdominal pain  Middle lower abdomen tenderness  Bladder scan negative  CT abdomen pelvis no acute finding  Concerning for stool/gas impaction  Fleet enema given with no resolution  TRH consulted for severe abdominal pain  CTA abd/pelvis no thrombotic/embolic dz  UA neg  Improved pain following morphine and miralax treatment w/ multiple bowel movements  Chronic afib  Was on Coumadin until Monday, INR 2.5  Due to planned cervical injection on Thursday, patient Coumadin switched to Lovenox twice daily dosing  However, Thursday cervical injection canceled due to inclement weather  Patient continued on Lovenox therapeutic dose and to Friday when headache started. Reversed w/ protamine on admission.  Currently on no antithrombotics due  to [place]  Hypertensive emergency  Home BP meds: Avapro ; Norvasc ; Lasix  Current BP meds: Avapro ; Norvasc ; Lasix  Add hydralazine 25->50 ->75 every 8h  Strict BP control with SBP goal < 160 mmHg  off Cleviprex now . Long-term BP goal normotensive  Hyperlipidemia  Home Lipid lowering medication: none   LDL 89, goal < 70  May consider statin at discharge  Hyponatremia  Na 133->131->131 - on salt tab  Off IVF  Fluid restriction to 1200cc  Other Stroke Risk Factors  Advanced age  Former cigarette smoker - quit   Other Active Problems  Code status - Full Code  Bradycardia - PPM  Anemia Hgb 11.0->9.9->11.2->10.5->10.3  Hypokalemia 3.4 -> supplement - 4.3->3.7->3.5  Hospital day # 4   Rosalin Hawking, MD PhD Stroke Neurology 05/23/2019 1:57 PM    To contact Stroke Continuity provider, please refer to [email or web address] After hours, contact General Neurology

## 2019-05-23 NOTE — NC FL2 (Signed)
[place] MEDICAID FL2 LEVEL OF CARE SCREENING TOOL     IDENTIFICATION  Patient Name: [name] Birthdate: 1925/07/17 Sex: female Admission Date (Current Location): 05/19/2019  [facility] [place] [facility] [place] Number:  Herbalist and Address:  [facility] [place], [place] [address], [place], [place] [address]      Provider Number: [id number]  Attending Physician Name and Address:  Rosalin Hawking, MD  Relative Name and Phone Number:       Current Level of Care: Hospital Recommended Level of Care: [place] Prior Approval Number:    Date Approved/Denied:   PASRR Number: [id number]  Discharge Plan: SNF    Current Diagnoses: Patient Active Problem List   Diagnosis Date Noted  . Abdominal pain 05/21/2019  . ICH (intracerebral hemorrhage) ([place]) 05/19/2019  . V tach ([place]) 08/09/2018  . Intractable nausea and vomiting 08/09/2018  . Hyponatremia 08/09/2018  . Hypomagnesemia 08/09/2018  . Hyperthyroidism 08/09/2018  . Epigastric pain   . Nonspecific chest pain 07/18/2018  . Anxiety 07/18/2018  . Insomnia 07/18/2018  . Complication of anesthesia   . Intractable headache 01/06/2017  . Intractable pain 01/04/2017  . Chronic anticoagulation 01/03/2017  . Neck pain 01/03/2017  . Acute bronchitis 06/08/2016  . Fall 09/24/2015  . Dizziness 09/24/2015  . Postoperative anemia due to acute blood loss 12/27/2013  . Postop Transfusion 12/27/2013  . OA (osteoarthritis) of hip 12/24/2013  . Preop cardiovascular exam 09/25/2013  . Cardiac pacemaker in situ 08/29/2008  . Essential hypertension 08/21/2008  . ATRIAL FIBRILLATION 08/21/2008  . Bradycardia 08/21/2008  . PNEUMONIA 08/21/2008  . GERD 08/21/2008  . NEPHROLITHIASIS 08/21/2008  . Osteoarthritis 08/21/2008    Orientation RESPIRATION BLADDER Height & Weight     Self, Time, Situation, Place  Normal Incontinent Weight:   Height:     BEHAVIORAL SYMPTOMS/MOOD NEUROLOGICAL BOWEL NUTRITION  STATUS      Continent Diet(see DC summary)  AMBULATORY STATUS COMMUNICATION OF NEEDS Skin   Limited Assist Verbally Normal                       Personal Care Assistance Level of Assistance  Bathing, Feeding, Dressing Bathing Assistance: Limited assistance Feeding assistance: Independent Dressing Assistance: Limited assistance     Functional Limitations Info  Sight Sight Info: Impaired(right eye impaired)        SPECIAL CARE FACTORS FREQUENCY  PT (By licensed PT), OT (By licensed OT)     PT Frequency: 5x/wk OT Frequency: 5x/wk            Contractures Contractures Info: Not present    Additional Factors Info  Code Status, Allergies, Psychotropic Code Status Info: Full Allergies Info: Morphine And Related Psychotropic Info: Klonopin 0.5mg  PRN at bed         Current Medications (05/23/2019):  This is the current hospital active medication list Current Facility-Administered Medications  Medication Dose Route Frequency Provider Last Rate Last Admin  .  stroke: mapping our early stages of recovery book   Does not apply Once Kerney Elbe, MD      . acetaminophen (TYLENOL) tablet 650 mg  650 mg Oral Q4H PRN Kerney Elbe, MD   650 mg at 05/21/19 0011   Or  . acetaminophen (TYLENOL) 160 MG/5ML solution 650 mg  650 mg Per Tube Q4H PRN Kerney Elbe, MD       Or  . acetaminophen (TYLENOL) suppository 650 mg  650 mg Rectal Q4H PRN Kerney Elbe, MD      .  albuterol (PROVENTIL) (2.5 MG/3ML) 0.083% nebulizer solution 3 mL  3 mL Inhalation Q4H PRN Harold Hedge, MD      . amLODipine (NORVASC) tablet 10 mg  10 mg Oral q morning - 10a Kerney Elbe, MD   10 mg at 05/23/19 1024  . clonazePAM (KLONOPIN) tablet 0.5 mg  0.5 mg Oral QHS PRN Kerney Elbe, MD   0.5 mg at 05/22/19 2147  . furosemide (LASIX) tablet 40 mg  40 mg Oral Daily Kerney Elbe, MD   40 mg at 05/23/19 1024  . hydrALAZINE (APRESOLINE) injection 5-10 mg  5-10 mg Intravenous Q4H PRN Rosalin Hawking, MD      .  hydrALAZINE (APRESOLINE) tablet 75 mg  75 mg Oral Q8H Rosalin Hawking, MD   75 mg at 05/23/19 0616  . irbesartan (AVAPRO) tablet 300 mg  300 mg Oral q morning - 10a Kerney Elbe, MD   300 mg at 05/23/19 1025  . methimazole (TAPAZOLE) tablet 5 mg  5 mg Oral TID Kerney Elbe, MD   5 mg at 05/23/19 1121  . morphine 2 MG/ML injection 2 mg  2 mg Intravenous Q6H PRN Rosalin Hawking, MD   2 mg at 05/22/19 2011  . ondansetron (ZOFRAN) injection 4 mg  4 mg Intravenous Q6H PRN Rosalin Hawking, MD   4 mg at 05/21/19 1110  . pantoprazole (PROTONIX) EC tablet 40 mg  40 mg Oral QHS Rosalin Hawking, MD   40 mg at 05/22/19 2147  . polyethylene glycol (MIRALAX / GLYCOLAX) packet 17 g  17 g Oral Daily PRN Charlynne Cousins, MD      . sodium chloride tablet 2 g  2 g Oral BID WC Rosalin Hawking, MD   2 g at 05/23/19 [id number]     Discharge Medications: Please see discharge summary for a list of discharge medications.  Relevant Imaging Results:  Relevant Lab Results:   Additional Information SS#: [id number]  Geralynn Ochs, LCSW

## 2019-05-24 DIAGNOSIS — I10 Essential (primary) hypertension: Secondary | ICD-10-CM

## 2019-05-24 DIAGNOSIS — E7800 Pure hypercholesterolemia, unspecified: Secondary | ICD-10-CM

## 2019-05-24 DIAGNOSIS — E785 Hyperlipidemia, unspecified: Secondary | ICD-10-CM

## 2019-05-24 DIAGNOSIS — Z7901 Long term (current) use of anticoagulants: Secondary | ICD-10-CM

## 2019-05-24 DIAGNOSIS — I4821 Permanent atrial fibrillation: Secondary | ICD-10-CM

## 2019-05-24 LAB — BASIC METABOLIC PANEL
Anion gap: 10 (ref 5–15)
BUN: 15 mg/dL (ref 8–23)
CO2: 25 mmol/L (ref 22–32)
Calcium: 8.5 mg/dL — ABNORMAL LOW (ref 8.9–10.3)
Chloride: 95 mmol/L — ABNORMAL LOW (ref 98–111)
Creatinine, Ser: 1.19 mg/dL — ABNORMAL HIGH (ref 0.44–1.00)
GFR calc Af Amer: 46 mL/min — ABNORMAL LOW (ref >60)
GFR calc non Af Amer: 40 mL/min — ABNORMAL LOW (ref >60)
Glucose, Bld: 108 mg/dL — ABNORMAL HIGH (ref 70–99)
Potassium: 3.6 mmol/L (ref 3.5–5.1)
Sodium: 130 mmol/L — ABNORMAL LOW (ref 135–145)

## 2019-05-24 LAB — CBC
HCT: 34.2 % — ABNORMAL LOW (ref 36.0–46.0)
Hemoglobin: 11 g/dL — ABNORMAL LOW (ref 12.0–15.0)
MCH: 26.3 pg (ref 26.0–34.0)
MCHC: 32.2 g/dL (ref 30.0–36.0)
MCV: 81.8 fL (ref 80.0–100.0)
Platelets: 292 10*3/uL (ref 150–400)
RBC: 4.18 MIL/uL (ref 3.87–5.11)
RDW: 23.8 % — ABNORMAL HIGH (ref 11.5–15.5)
WBC: 7.5 10*3/uL (ref 4.0–10.5)
nRBC: 0 % (ref 0.0–0.2)

## 2019-05-24 MED ORDER — POLYETHYLENE GLYCOL 3350 17 G PO PACK: 17.0000 g | PACK | Freq: Every day | ORAL | 0 refills | Status: AC | PRN

## 2019-05-24 MED ORDER — HYDRALAZINE HCL 25 MG PO TABS: 75.0000 mg | ORAL_TABLET | Freq: Three times a day (TID) | ORAL | Status: DC

## 2019-05-24 NOTE — Progress Notes (Signed)
  Speech Language Pathology Treatment: Cognitive-Linquistic  Patient Details Name: [name] MRN: [id number] DOB: 1925-12-02 Today's Date: 05/24/2019 Time: [id number] SLP Time Calculation (min) (ACUTE ONLY): 11 min  Assessment / Plan / Recommendation Clinical Impression  Pt seen at bedside for ST treatment. Patient lethargic, but able to participate. Patient oriented x4 today. Patient endorses word-finding difficulty. In confrontation naming task, patient able to name 10/10 items, though she needed additional time to respond. In generative naming tasks, patient able to name 5 and 6 items in tasks where she was given one minute. In descriptive naming task, patient's speech noted to be somewhat halting and she utilized vague language, some circumlocution. Patient able to follow 1 and 2 step verbal commands, but had difficulty with 3-step and complex verbal commands. Patient plan to d/c to SNF today. She will benefit from follow-up ST at the next venue of care.    HPI HPI: Pt is a 86 yo female admitted with c/o headache and visual changes.  PT found o have a L occipital ICH.  Pt with h/o cataracts, B TKRs, back surgery and pacemaker.      SLP Plan  Discharge SLP treatment due to (comment)(she's going to SNF )       Recommendations                   Follow up Recommendations: Skilled Nursing facility SLP Visit Diagnosis: Aphasia (R47.01) Plan: Discharge SLP treatment due to (comment)(she's going to SNF )       [place] 05/24/2019, 1:42 PM  Marina Goodell, M.Ed., [place] [facility] Acute Rehabilitation [phone]: Acute Rehab office [phone] - pager

## 2019-05-24 NOTE — Progress Notes (Signed)
Physical Therapy Treatment Patient Details Name: [name] DOBEY MRN: [id number] DOB: [birth year] Today's Date: 05/24/2019    History of Present Illness Pt is a 86 yo female admitted with c/o headache and visual changes.  Pt found to have a L occipital ICH.  Pt with h/o cataracts, B TKRs, back surgery and pacemaker.    PT Comments    Pt making steady progress with functional mobility. She was able to increase distance ambulated and required less physical assistance than previous session. Son was present throughout as well and continuing to pursue SNF placement. Pt would continue to benefit from skilled physical therapy services at this time while admitted and after d/c to address the below listed limitations in order to improve overall safety and independence with functional mobility.    Follow Up Recommendations  SNF;Supervision/Assistance - 24 hour     Equipment Recommendations  None recommended by PT    Recommendations for Other Services       Precautions / Restrictions Precautions Precautions: Fall Restrictions Weight Bearing Restrictions: No    Mobility  Bed Mobility Overal bed mobility: Needs Assistance Bed Mobility: Supine to Sit     Supine to sit: Min guard     General bed mobility comments: no physical assistance needed, use of bed rails and HOB elevated  Transfers Overall transfer level: Needs assistance Equipment used: Rolling walker (2 wheeled) Transfers: Sit to/from Stand Sit to Stand: Min guard         General transfer comment: good technique utilized, min guard for safety  Ambulation/Gait Ambulation/Gait assistance: Min assist;Min guard Gait Distance (Feet): 150 Feet Assistive device: Rolling walker (2 wheeled) Gait Pattern/deviations: Step-through pattern;Decreased stride length;Wide base of support Gait velocity: decreased   General Gait Details: pt requiring frequent cueing and min A with use of RW and to maintain proximity; modest  instability but no overt LOB   Stairs             Wheelchair Mobility    Modified Rankin (Stroke Patients Only) Modified Rankin (Stroke Patients Only) Pre-Morbid Rankin Score: Slight disability Modified Rankin: Moderately severe disability     Balance Overall balance assessment: Needs assistance Sitting-balance support: Feet supported Sitting balance-Leahy Scale: Fair     Standing balance support: Bilateral upper extremity supported Standing balance-Leahy Scale: Poor                              Cognition Arousal/Alertness: Awake/alert Behavior During Therapy: WFL for tasks assessed/performed Overall Cognitive Status: Impaired/Different from baseline Area of Impairment: Problem solving;Safety/judgement                         Safety/Judgement: Decreased awareness of deficits;Decreased awareness of safety   Problem Solving: Difficulty sequencing;Requires verbal cues        Exercises      General Comments        Pertinent Vitals/Pain Pain Assessment: No/denies pain    Home Living                      Prior Function            PT Goals (current goals can now be found in the care plan section) Acute Rehab PT Goals PT Goal Formulation: With patient Time For Goal Achievement: 06/05/19 Potential to Achieve Goals: Fair Progress towards PT goals: Progressing toward goals    Frequency    Min 3X/week  PT Plan Current plan remains appropriate    Co-evaluation              AM-PAC PT "6 Clicks" Mobility   Outcome Measure  Help needed turning from your back to your side while in a flat bed without using bedrails?: None Help needed moving from lying on your back to sitting on the side of a flat bed without using bedrails?: None Help needed moving to and from a bed to a chair (including a wheelchair)?: A Little Help needed standing up from a chair using your arms (e.g., wheelchair or bedside chair)?: A  Little Help needed to walk in hospital room?: A Little Help needed climbing 3-5 steps with a railing? : A Lot 6 Click Score: 19    End of Session Equipment Utilized During Treatment: Gait belt Activity Tolerance: Patient tolerated treatment well Patient left: with call bell/phone within reach;with family/visitor present;Other (comment)(seated on toilet) Nurse Communication: Mobility status PT Visit Diagnosis: Other abnormalities of gait and mobility (R26.89);History of falling (Z91.81);Muscle weakness (generalized) (M62.81)     Time: [id number] PT Time Calculation (min) (ACUTE ONLY): 29 min  Charges:  $Gait Training: 23-37 mins                     Anastasio Champion, DPT  Acute Rehabilitation Services Pager [phone] Office [place] 05/24/2019, 12:28 PM

## 2019-05-24 NOTE — Discharge Summary (Addendum)
Stroke Discharge Summary  Patient ID: [name]   MRN: [id number]      DOB: [birth year]  Date of Admission: 05/19/2019 Date of Discharge: 05/24/2019  Attending Physician:  Rosalin Hawking, MD, Stroke MD Consultant(s):     [facility] Patient's PCP:  [name], MD  DISCHARGE DIAGNOSIS:  Principal Problem:   ICH (intracerebral hemorrhage) ([place]) Active Problems:   Essential hypertension   ATRIAL FIBRILLATION   Bradycardia   GERD   Cardiac pacemaker in situ   Chronic anticoagulation   Hyponatremia   Hyperthyroidism   Abdominal pain   Hyperlipidemia   Allergies as of 05/24/2019      Reactions   Morphine And Related Nausea And Vomiting      Medication List    STOP taking these medications   enoxaparin 80 MG/0.8ML injection Commonly known as: LOVENOX   ondansetron 4 MG tablet Commonly known as: ZOFRAN   warfarin 2 MG tablet Commonly known as: COUMADIN     TAKE these medications   acetaminophen 325 MG tablet Commonly known as: TYLENOL Take 650 mg by mouth every 6 (six) hours as needed for mild pain.   amLODipine 10 MG tablet Commonly known as: NORVASC Take 10 mg by mouth every morning.   clonazePAM 0.5 MG tablet Commonly known as: KLONOPIN Take 0.5 mg by mouth at bedtime as needed for anxiety.   docusate sodium 100 MG capsule Commonly known as: COLACE Take 100 mg by mouth daily as needed for mild constipation.   feeding supplement (ENSURE ENLIVE) Liqd Take 237 mLs by mouth 2 (two) times daily between meals.   ferrous sulfate 325 (65 FE) MG tablet Take 325 mg by mouth 2 (two) times daily.   furosemide 20 MG tablet Commonly known as: LASIX Take 2 tablets (40 mg total) by mouth daily.   hydrALAZINE 25 MG tablet Commonly known as: APRESOLINE Take 3 tablets (75 mg total) by mouth every 8 (eight) hours.   irbesartan 300 MG tablet Commonly known as: AVAPRO Take 300 mg by mouth every morning.   methimazole 5 MG  tablet Commonly known as: TAPAZOLE Take 5 mg by mouth 3 (three) times daily.   Norco 5-325 MG tablet Generic drug: HYDROcodone-acetaminophen Take 0.5 tablets by mouth every 6 (six) hours as needed for moderate pain.   omeprazole 20 MG capsule Commonly known as: PRILOSEC TAKE 1 CAPSULE (20 MG TOTAL) BY MOUTH DAILY. TAKE 30-60 MIN BEFORE DINNER What changed: when to take this   polyethylene glycol 17 g packet Commonly known as: MIRALAX / GLYCOLAX Take 17 g by mouth daily as needed for mild constipation.   potassium chloride SA 20 MEQ tablet Commonly known as: KLOR-CON Take 20 mEq by mouth 2 (two) times daily.       LABORATORY STUDIES CBC    Component Value Date/Time   WBC 7.5 05/24/2019 0110   RBC 4.18 05/24/2019 0110   HGB 11.0 (L) 05/24/2019 0110   HGB 10.0 (L) 02/12/2019 0931   HCT 34.2 (L) 05/24/2019 0110   HCT 32.4 (L) 02/12/2019 0931   PLT 292 05/24/2019 0110   PLT 384 02/12/2019 0931   MCV 81.8 05/24/2019 0110   MCV 78 (L) 02/12/2019 0931   MCH 26.3 05/24/2019 0110   MCHC 32.2 05/24/2019 0110   RDW 23.8 (H) 05/24/2019 0110   RDW 15.8 (H) 02/12/2019 0931   LYMPHSABS 0.9 05/19/2019 2010   LYMPHSABS 1.1 02/12/2019 0931   MONOABS 0.4 05/19/2019 2010  EOSABS 0.1 05/19/2019 2010   EOSABS 0.1 02/12/2019 0931   BASOSABS 0.1 05/19/2019 2010   BASOSABS 0.0 02/12/2019 0931   CMP    Component Value Date/Time   NA 130 (L) 05/24/2019 0110   NA 132 (L) 02/12/2019 0931   K 3.6 05/24/2019 0110   CL 95 (L) 05/24/2019 0110   CO2 25 05/24/2019 0110   GLUCOSE 108 (H) 05/24/2019 0110   BUN 15 05/24/2019 0110   BUN 15 02/12/2019 0931   CREATININE 1.19 (H) 05/24/2019 0110   CALCIUM 8.5 (L) 05/24/2019 0110   PROT 6.0 (L) 05/21/2019 2159   ALBUMIN 3.4 (L) 05/21/2019 2159   AST 26 05/21/2019 2159   ALT 22 05/21/2019 2159   ALKPHOS 84 05/21/2019 2159   BILITOT 0.8 05/21/2019 2159   GFRNONAA 40 (L) 05/24/2019 0110   GFRAA 46 (L) 05/24/2019 0110   COAGS Lab Results   Component Value Date   INR 1.0 05/19/2019   INR 1.0 05/18/2019   INR 1.8 (H) 10/16/2018   Lipid Panel    Component Value Date/Time   CHOL 193 05/21/2019 0417   TRIG 135 05/21/2019 1116   HDL 59 05/21/2019 0417   CHOLHDL 3.3 05/21/2019 0417   VLDL 8 05/21/2019 0417   LDLCALC 126 (H) 05/21/2019 0417   HgbA1C  Lab Results  Component Value Date   HGBA1C 5.2 05/19/2019   Urinalysis    Component Value Date/Time   COLORURINE STRAW (A) 05/22/2019 0140   APPEARANCEUR CLEAR 05/22/2019 0140   LABSPEC 1.042 (H) 05/22/2019 0140   PHURINE 7.0 05/22/2019 0140   GLUCOSEU NEGATIVE 05/22/2019 0140   HGBUR NEGATIVE 05/22/2019 0140   BILIRUBINUR NEGATIVE 05/22/2019 0140   KETONESUR NEGATIVE 05/22/2019 0140   PROTEINUR NEGATIVE 05/22/2019 0140   UROBILINOGEN 0.2 11/12/2014 1331   NITRITE NEGATIVE 05/22/2019 0140   LEUKOCYTESUR NEGATIVE 05/22/2019 0140    SIGNIFICANT DIAGNOSTIC STUDIES CT Angio Head W or Wo Contrast CT Angio Neck W and/or Wo Contrast 05/18/2019 IMPRESSION:  1. No subarachnoid hemorrhage. Normal CT appearance of the noncontrast head.  2. Moderate tortuosity of the cervical internal carotid arteries bilaterally without significant stenosis relative to the more distal vessels.  3. Mild bilateral carotid bifurcation atherosclerotic changes without significant stenosis.  4. Additional atherosclerotic changes at the aortic arch and within the cavernous internal carotid arteries without significant stenosis.  5. Multilevel spondylosis of the cervical spine.  6. Aortic Atherosclerosis (ICD10-I70.0).   CT Head Wo Contrast 05/19/2019 IMPRESSION:  Left occipital intraparenchymal hemorrhage, 8.7cc volume, slightly increased in size from exam yesterday. No midline shift.   CT Head WO Contrast  05/20/19 1. Left occipital intra-axial hemorrhage has increased by 1-2 mL since yesterday, now about 3.4 cm (11 mL). But regional edema and mild regional mass effect are stable. 2. And  there is trace new intraventricular extension into the left occipital horn. But ventricle size remains normal.  CT Head WO Contrast  05/21/19 Stable hematoma left occipital lobe. Trace extension into the left occipital horn slightly improved. No hydrocephalus. No new hemorrhage  Transthoracic Echocardiogram  1. Left ventricular ejection fraction, by estimation, is 60 to 65%. The left ventricle has normal function. The left ventricle has no regional wall motion abnormalities. Left ventricular diastolic parameters are indeterminate in the setting of atrial fibrillation.  2. Right ventricular systolic function is normal. The right ventricular size is normal. There is moderately elevated pulmonary artery systolic pressure. The estimated right ventricular systolic pressure is [id number] mmHg.  3. Left atrial  size was severely dilated.  4. Right atrial size was moderately dilated.  5. The mitral valve is degenerative. Trivial mitral valve regurgitation.  6. The aortic valve is tricuspid. Aortic valve regurgitation is not visualized.  7. The inferior vena cava is dilated in size with >50% respiratory variability, suggesting right atrial pressure of 8 mmHg.   DG HIP UNILAT WITH PELVIS 2-3 VIEWS RIGHT 05/20/2019 IMPRESSION:  Good appearance following previous right hip arthroplasty. No traumatic finding.   Chest 2 View 05/19/2019 IMPRESSION:  1. Bibasilar airspace disease favored to represent atelectasis. Small bilateral pleural effusions.  2. Cardiomegaly with mild vascular congestion.  3. Left-sided pacemaker.   CT ABDOMEN PELVIS W CONTRAST 05/21/2019 1. No acute findings to explain the patient's pain. 2. Hyperdense lesions in both kidneys cannot be characterized as simple cysts. Lesion on the right appears minimally larger. If further evaluation is desired, CT or MR without and with contrast could be performed. 3. Isolated and nonspecific right paramidline omental nodule, unchanged. 4.   Aortic atherosclerosis (ICD10-I70.0).   CT Angio Abd/Pel w/ and/or w/o 1. No acute intra-abdominal or pelvic pathology. 2. Sigmoid diverticulosis. No bowel obstruction or active inflammation. 3. No pneumatosis, portal venous gas, or free air. 4. Additional findings as above including Aortic Atherosclerosis (ICD10-I70.0).    ECG - atrial fibrillation - ventricular response 64 BPM intermittent pacing (See cardiology reading for complete details)    HISTORY OF PRESENT ILLNESS [name] is an 86 y.o. female with atrial fibrillation on Coumadin who re-presents with severe headache. She initially presented to the ED yesterday with severe headache and possible right sided vision loss. Per EDP note: "[id number] with gradual onset of worst headache of life with possible right peripheral vision change with nausea and confusion which she says is secondary to pain. On Coumadin for afib, no head injury. CTA normal. Getting pain meds now."  A CTA of head and neck was obtained which was reported as negative for hemorrhage on the official report; however, on review of the images from yesterday's scan on her re-presentation today, it is apparent that there was a left occipital lobe hemorrhage at that time.   On interview with the patient this evening, she endorses severe headache to the extent that she frequently perseverates on this when asked other questions and when given instructions during exam.   Repeat CT head obtained this evening again shows a medium sized left occipital lobe hemorrhage with a small amount of adjacent vasogenic edema (preliminary review by Neurologist).  Admitted to the Neuro ICU.    HOSPITAL COURSE [name] is a 86 y.o. female with history of HLD,  HTN, PPM (bradycardia), atrial fibrillation on Lovenox PTA whore-presents with severe headache found to have a medium sized left occipital lobe hemorrhage with a small amount of adjacent vasogenic edema.  Stroke:  left  occipital lobe ICH in the setting of full dose lovenox for atrial fibrillation and hypertension  CT head 2/19 - Left occipital intraparenchymal hemorrhage  CTA H&N 2/19 - No aneurysm or AVM  CT Head 2/20 - increased size at left occipital ICH  CT head repeat 05/20/19 - minimal increase of left occipital ICH with trace IVH  CT head repeat 2/22 stable hematoma no significant IVH  2D Echo EF 60-65%  [employer] Virus 2 - negative on admission   LDL - 89  HgbA1c - 5.2  Full dose Lovenox prior to admission, now on No antithrombotic given hemorrhage - received protamine for reversal  on admission  Therapy recommendations:  SNF  Disposition:  SNF  COVID test neg for SNF d/c   Abdominal pain  Middle lower abdomen tenderness  Bladder scan negative  CT abdomen pelvis no acute finding  Concerning for stool/gas impaction  Fleet enema given with no resolution  TRH consulted for severe abdominal pain  CTA abd/pelvis no thrombotic/embolic dz  UA neg  Improved pain following morphine and miralax treatment w/ multiple bowel movements  Miralax recommended for home use  Chronic afib  Was on Coumadin until Monday, INR 2.5  Due to planned cervical injection on Thursday, patient Coumadin switched to Lovenox twice daily dosing  However, Thursday cervical injection canceled due to inclement weather  Patient continued on Lovenox therapeutic dose and to Friday when headache started. Reversed w/ protamine on admission.  Currently on no antithrombotics due to [place]  May consider to resume [facility] with DOAC at follow-up with stroke clinic   Hypertensive emergency  Home BP meds: Avapro ; Norvasc ; Lasix  Current BP meds: Avapro ; Norvasc ; Lasix  Add hydralazine 25->50 ->75 every 8h  Strict BP control with SBP goal < 160 mmHg  off Cleviprex now  Long-term BP goal normotensive  Hyperlipidemia  Home Lipid lowering medication: none   LDL 89, goal < 70  Consider statin at  follow up  Hyponatremia, chronic  Na 133->131->131->130   Off IVF  Treated w/ salt tab in hospital, will not continue at d/c   Other Stroke Risk Factors  Advanced age  Former cigarette smoker - quit   Other Active Problems  Bradycardia - PPM  Anemia Hgb 11.0->9.9->11.2->10.5->10.3->11.0  Hypokalemia 3.4 -> supplement - 4.3->3.7->3.5->3.6  resolved   DISCHARGE EXAM Blood pressure (!) 131/53, pulse 72, temperature 98.2 F (36.8 C), temperature source Oral, resp. rate 18, SpO2 97 %. General- Well nourished, well developed, in no apparent distress.  Cardiovascular -irregularly irregular heart rate and rhythm.  Neuro- awake alert, orientated to self, people, place, age but not orientated to time. Able to name and repeat simple sentence. No more aphasia, but still has mild dysarthria.  Visual field full, however, right side visual acuity decreased than left. right mild facial droop, tongue midline. PERRL, EOMI. Moving all extremities symmetrically. Sensation and coordination intact, gait not tested.   Discharge Diet   Heart healthy thin liquids  DISCHARGE PLAN  Disposition:  Skilled nursing facility for ongoing PT, OT and ST.   Due to hemorrhage and risk of bleeding, do not take aspirin, aspirin-containing medications, or ibuprofen products   Ongoing risk factor control by Primary Care Physician at time of discharge  Follow-up PCP Tisovec, Fransico Him, MD in 2 weeks.  Follow-up in [facility] in 4 weeks, office to schedule an appointment.   35 minutes were spent preparing discharge.  Rosalin Hawking, MD PhD Stroke Neurology 05/24/2019 6:53 PM

## 2019-05-24 NOTE — TOC Transition Note (Signed)
Transition of Care [facility]) - CM/SW Discharge Note   Patient Details  Name: [name] MRN: [id number] Date of Birth: 1925-04-07  [facility] CM/SW Contact:  Kirstie Peri, [place] Work Phone Number: 05/24/2019, 1:19 PM   Clinical Narrative:     Nurse to call report to: [phone] Room number: [id number]  Final next level of care: [place] Barriers to Discharge: Barriers Resolved   Patient Goals and CMS Choice Patient states their goals for this hospitalization and ongoing recovery are:: to get back home CMS Medicare.gov Compare Post Acute Care list provided to:: Patient Choice offered to / list presented to : Patient  Discharge Placement              Patient chooses bed at: [facility] Patient to be transferred to facility by: [place] Name of family member notified: Lilia Pro Patient and family notified of of transfer: 05/24/19  Discharge Plan and Services     Post Acute Care Choice: [place]                               Social Determinants of Health (SDOH) Interventions     Readmission Risk Interventions No flowsheet data found.

## 2019-05-24 NOTE — Care Management Important Message (Signed)
Important Message  Patient Details  Name: [name] MRN: [id number] Date of Birth: [birth year]   Medicare Important Message Given:  Yes     [name] [address] 05/24/2019, 3:48 PM

## 2019-05-24 NOTE — Progress Notes (Signed)
Patient discharging to SNF. Report called to incoming facility. [facility] called for transport. All discharge paperwork sent with [facility]. All belongings sent with patient. [place]

## 2019-05-24 NOTE — Progress Notes (Signed)
[facility] Consult PROGRESS NOTE    Progress Note  [name]  [id number] DOB: [birth year] DOA: 05/19/2019 PCP: [name], MD     Brief Narrative:   [name] is an 86 y.o. female past medical history of A. fib on Coumadin, bradycardia status post pacemaker placement, GERD nephrolithiasis, essential hypertension history of appendectomy and cholecystectomy admitted under the neurology service in the ICU for severe headache secondary to left occipital lobe intracranial hemorrhage in the setting of A. fib and anticoagulation accompanied by hypertensive urgency.  Anticoagulation was stopped patient complained of acute severe abdominal pain CT scan of the abdomen and pelvis was done that revealed no acute findings.  Her abdominal pain was attributed to stool" was given several Fleet enemas but despite this the pain continued, tried was consulted for management of her abdominal pain.  Assessment/Plan:   Severe abdominal pain likely secondary: She relates her abdominal pain is resolved today she is had multiple bowel movement. She will probably have to go on a regimen at home will recommend MiraLAX p.o. daily she could take it twice a day or every other day has needed. We will sign off.  Left occipital lobe intracranial hemorrhage in the setting of atrial fibrillation on Coumadin: Further management per neurology.  Hypertensive emergency Hyperlipidemia Hyponatremia Bradycardia status post pacemaker Normocytic anemia Hypokalemia  DVT prophylaxis: SCD Family Communication:son Disposition Plan/Barrier to D/C:   Code Status:     Code Status Orders  (From admission, onward)         Start     Ordered   05/19/19 2258  Full code  Continuous     05/19/19 2304        Code Status History    Date Active Date Inactive Code Status Order ID Comments User Context   08/09/2018 0048 08/11/2018 1857 Full Code [id number]  Ivor Costa, MD ED   07/18/2018 1743  07/19/2018 1740 DNR [id number]  Anda Latina, MD Inpatient   01/06/2017 2033 01/10/2017 1837 DNR [id number]  Elodia Florence., MD Inpatient   01/04/2017 0245 01/04/2017 1651 Full Code [id number]  Toy Baker, MD Inpatient   06/08/2016 0326 06/15/2016 2120 Full Code [id number]  Rise Patience, MD Inpatient   09/24/2015 0533 09/25/2015 1737 Full Code [id number]  Etta Quill, DO Inpatient   12/24/2013 1654 12/27/2013 1637 Full Code [id number]  [name], Dione Plover, MD Inpatient   Advance Care Planning Activity        IV Access:    Peripheral IV   Procedures and diagnostic studies:   No results found.   Medical Consultants:    None.  Anti-Infectives:   None  Subjective:    [name] no further abdominal pain.  Objective:    Vitals:   05/23/19 2041 05/24/19 0007 05/24/19 0445 05/24/19 0735  BP: 126/64 (!) 131/49 (!) 149/57 (!) 131/53  Pulse: 75 62 68 72  Resp: 18 18 18 18   Temp: 97.6 F (36.4 C) 98.1 F (36.7 C) 99.3 F (37.4 C) 98.2 F (36.8 C)  TempSrc: Oral Oral Oral Oral  SpO2: 96% 95% 97% 97%   SpO2: 97 % O2 Flow Rate (L/min): 2 L/min   Intake/Output Summary (Last 24 hours) at 05/24/2019 [id number] Last data filed at 05/24/2019 0600 Gross per 24 hour  Intake 390 ml  Output 1800 ml  Net -1410 ml   There were no vitals filed for this visit.  Exam: General exam: In no acute distress. Respiratory system: Good air  movement and clear to auscultation. Cardiovascular system: S1 & S2 heard, RRR. No JVD. Gastrointestinal system: Abdomen is nondistended, soft and nontender.  Central nervous system: Alert and oriented.  Extremities: No pedal edema. Skin: No rashes, lesions or ulcers   Data Reviewed:    Labs: Basic Metabolic Panel: Recent Labs  Lab 05/21/19 0417 05/21/19 0417 05/21/19 2159 05/21/19 2159 05/22/19 0520 05/22/19 0520 05/23/19 0637 05/24/19 0110  NA 131*  --  127*  --  131*  --  131* 130*  K 3.4*   < > 4.3   < > 3.7   <  > 3.5 3.6  CL 94*  --  94*  --  96*  --  97* 95*  CO2 27  --  23  --  23  --  26 25  GLUCOSE 99  --  110*  --  91  --  93 108*  BUN 10  --  12  --  9  --  13 15  CREATININE 0.91  --  0.91  --  0.96  --  0.95 1.19*  CALCIUM 8.6*  --  8.6*  --  8.7*  --  8.6* 8.5*   < > = values in this interval not displayed.   GFR Estimated Creatinine Clearance: 28.5 mL/min (A) (by C-G formula based on SCr of 1.19 mg/dL (H)). Liver Function Tests: Recent Labs  Lab 05/21/19 2159  AST 26  ALT 22  ALKPHOS 84  BILITOT 0.8  PROT 6.0*  ALBUMIN 3.4*   No results for input(s): LIPASE, AMYLASE in the last 168 hours. No results for input(s): AMMONIA in the last 168 hours. Coagulation profile Recent Labs  Lab 05/18/19 1227 05/19/19 2351  INR 1.0 1.0   COVID-19 Labs  No results for input(s): DDIMER, FERRITIN, LDH, CRP in the last 72 hours.  Lab Results  Component Value Date   SARSCOV2NAA NEGATIVE 05/23/2019   [place] NEGATIVE 05/19/2019   [place] NEGATIVE 10/16/2018   [place] NEGATIVE 08/08/2018    CBC: Recent Labs  Lab 05/18/19 1227 05/18/19 1227 05/19/19 2010 05/19/19 2010 05/21/19 0417 05/21/19 2159 05/22/19 0520 05/23/19 0637 05/24/19 0110  WBC 6.9   < > 8.0   < > 7.9 10.2 6.8 7.4 7.5  NEUTROABS 5.6  --  6.6  --   --   --   --   --   --   HGB 11.9*   < > 11.0*   < > 9.9* 11.2* 10.5* 10.3* 11.0*  HCT 38.3   < > 35.3*   < > 30.9* 34.5* 32.9* 32.6* 34.2*  MCV 84.2   < > 82.9   < > 83.3 80.6 81.8 82.3 81.8  PLT 301   < > 300   < > 255 264 257 271 292   < > = values in this interval not displayed.   Cardiac Enzymes: No results for input(s): CKTOTAL, CKMB, CKMBINDEX, TROPONINI in the last 168 hours. BNP (last 3 results) No results for input(s): PROBNP in the last 8760 hours. CBG: No results for input(s): GLUCAP in the last 168 hours. D-Dimer: No results for input(s): DDIMER in the last 72 hours. Hgb A1c: No results for input(s): HGBA1C in the last 72 hours. Lipid  Profile: Recent Labs    05/21/19 1116  TRIG 135   Thyroid function studies: No results for input(s): TSH, T4TOTAL, T3FREE, THYROIDAB in the last 72 hours.  Invalid input(s): FREET3 Anemia work up: No results for input(s): VITAMINB12, FOLATE, FERRITIN, TIBC,  IRON, RETICCTPCT in the last 72 hours. Sepsis Labs: Recent Labs  Lab 05/21/19 0417 05/21/19 1116 05/21/19 1441 05/21/19 2159 05/22/19 0520 05/23/19 0637 05/24/19 0110  WBC   < >  --   --  10.2 6.8 7.4 7.5  LATICACIDVEN  --  1.9 1.3 1.0  --   --   --    < > = values in this interval not displayed.   Microbiology Recent Results (from the past 240 hour(s))  SARS CORONAVIRUS 2 (TAT 6-24 HRS) Nasopharyngeal Nasopharyngeal Swab     Status: None   Collection Time: 05/19/19  8:47 PM   Specimen: Nasopharyngeal Swab  Result Value Ref Range Status   SARS Coronavirus 2 NEGATIVE NEGATIVE Final    Comment: (NOTE) SARS-CoV-2 target nucleic acids are NOT DETECTED. The SARS-CoV-2 RNA is generally detectable in upper and lower respiratory specimens during the acute phase of infection. Negative results do not preclude SARS-CoV-2 infection, do not rule out co-infections with other pathogens, and should not be used as the sole basis for treatment or other patient management decisions. Negative results must be combined with clinical observations, patient history, and epidemiological information. The expected result is Negative. Fact Sheet for Patients: SugarRoll.be Fact Sheet for Healthcare Providers: https://www.woods-mathews.com/ This test is not yet approved or cleared by the [place] FDA and  has been authorized for detection and/or diagnosis of SARS-CoV-2 by FDA under an Emergency Use Authorization (EUA). This EUA will remain  in effect (meaning this test can be used) for the duration of the COVID-19 declaration under Section 56 4(b)(1) of the Act, 21 U.S.C. section 360bbb-3(b)(1),  unless the authorization is terminated or revoked sooner. Performed at [facility], [place] [address], [place], [place] [address]   MRSA PCR Screening     Status: None   Collection Time: 05/20/19  1:39 AM   Specimen: Nasal Mucosa; Nasopharyngeal  Result Value Ref Range Status   MRSA by PCR NEGATIVE NEGATIVE Final    Comment:        The GeneXpert MRSA Assay (FDA approved for NASAL specimens only), is one component of a comprehensive MRSA colonization surveillance program. It is not intended to diagnose MRSA infection nor to guide or monitor treatment for MRSA infections. Performed at [facility], [place] [address], [place], [place] [address]   SARS CORONAVIRUS 2 (TAT 6-24 HRS) Nasopharyngeal Nasopharyngeal Swab     Status: None   Collection Time: 05/23/19  2:41 PM   Specimen: Nasopharyngeal Swab  Result Value Ref Range Status   SARS Coronavirus 2 NEGATIVE NEGATIVE Final    Comment: (NOTE) SARS-CoV-2 target nucleic acids are NOT DETECTED. The SARS-CoV-2 RNA is generally detectable in upper and lower respiratory specimens during the acute phase of infection. Negative results do not preclude SARS-CoV-2 infection, do not rule out co-infections with other pathogens, and should not be used as the sole basis for treatment or other patient management decisions. Negative results must be combined with clinical observations, patient history, and epidemiological information. The expected result is Negative. Fact Sheet for Patients: SugarRoll.be Fact Sheet for Healthcare Providers: https://www.woods-mathews.com/ This test is not yet approved or cleared by the [place] FDA and  has been authorized for detection and/or diagnosis of SARS-CoV-2 by FDA under an Emergency Use Authorization (EUA). This EUA will remain  in effect (meaning this test can be used) for the duration of the COVID-19 declaration under Section 56 4(b)(1) of the Act,  21 U.S.C. section 360bbb-3(b)(1), unless the authorization  is terminated or revoked sooner. Performed at [facility], [place] [address] [place], [place] [address]      Medications:   .  stroke: mapping our early stages of recovery book   Does not apply Once  . amLODipine  10 mg Oral q morning - 10a  . furosemide  40 mg Oral Daily  . hydrALAZINE  75 mg Oral Q8H  . irbesartan  300 mg Oral q morning - 10a  . methimazole  5 mg Oral TID  . pantoprazole  40 mg Oral QHS  . sodium chloride  2 g Oral BID WC   Continuous Infusions:    LOS: 5 days   Charlynne Cousins  [facility]  05/24/2019, 8:26 AM

## 2019-05-28 ENCOUNTER — Other Ambulatory Visit: Payer: Self-pay | Admitting: *Deleted

## 2019-05-28 NOTE — Patient Outreach (Signed)
[facility]) Care Management  05/28/2019  [name] [birth year] [id number]   EMMI-GENERAL DISCHARGE  RN spoke with pt and introduced [facility] and the purpose for today's call. Pt reports she was placed in a SNF upon her recent discharge from the hospital for rehab.  PLAN: Will request EMMI calls to cease due to her recent placement.  Raina Mina, RN Care Management Coordinator [place] Office [phone]

## 2019-05-29 ENCOUNTER — Other Ambulatory Visit: Payer: Self-pay | Admitting: *Deleted

## 2019-05-29 NOTE — Patient Outreach (Signed)
[facility]) Care Management  05/29/2019  CAMBER DRINNEN 24-May-1925 [id number]   EMMI program case closure completed.  Per covering RNCM Raina Mina) patient remains inpatient at skilled nursing facility and EMMI calls have been discontinued.   Harlow Carrizales H. Annia Friendly, BSN, [place] Management [facility] Telephonic CM Phone: [phone] Fax: [phone]

## 2019-06-08 ENCOUNTER — Inpatient Hospital Stay (HOSPITAL_COMMUNITY): Payer: Medicare Other

## 2019-06-08 ENCOUNTER — Other Ambulatory Visit: Payer: Self-pay

## 2019-06-08 ENCOUNTER — Emergency Department (HOSPITAL_COMMUNITY): Payer: Medicare Other

## 2019-06-08 ENCOUNTER — Encounter (HOSPITAL_COMMUNITY): Payer: Self-pay | Admitting: *Deleted

## 2019-06-08 ENCOUNTER — Inpatient Hospital Stay (HOSPITAL_COMMUNITY)
Admission: EM | Admit: 2019-06-08 | Discharge: 2019-06-18 | DRG: 065 | Disposition: A | Discharge status: Skilled Nursing Facility | Payer: Medicare Other | Source: Skilled Nursing Facility | Attending: Internal Medicine | Admitting: Internal Medicine

## 2019-06-08 DIAGNOSIS — E876 Hypokalemia: Secondary | ICD-10-CM | POA: Diagnosis present

## 2019-06-08 DIAGNOSIS — R414 Neurologic neglect syndrome: Secondary | ICD-10-CM | POA: Diagnosis present

## 2019-06-08 DIAGNOSIS — I5022 Chronic systolic (congestive) heart failure: Secondary | ICD-10-CM | POA: Diagnosis not present

## 2019-06-08 DIAGNOSIS — R29810 Facial weakness: Secondary | ICD-10-CM | POA: Diagnosis present

## 2019-06-08 DIAGNOSIS — Z950 Presence of cardiac pacemaker: Secondary | ICD-10-CM

## 2019-06-08 DIAGNOSIS — I110 Hypertensive heart disease with heart failure: Secondary | ICD-10-CM | POA: Diagnosis present

## 2019-06-08 DIAGNOSIS — I639 Cerebral infarction, unspecified: Secondary | ICD-10-CM

## 2019-06-08 DIAGNOSIS — I611 Nontraumatic intracerebral hemorrhage in hemisphere, cortical: Secondary | ICD-10-CM

## 2019-06-08 DIAGNOSIS — Z90710 Acquired absence of both cervix and uterus: Secondary | ICD-10-CM

## 2019-06-08 DIAGNOSIS — E7800 Pure hypercholesterolemia, unspecified: Secondary | ICD-10-CM

## 2019-06-08 DIAGNOSIS — K219 Gastro-esophageal reflux disease without esophagitis: Secondary | ICD-10-CM | POA: Diagnosis present

## 2019-06-08 DIAGNOSIS — E0500 Thyrotoxicosis with diffuse goiter without thyrotoxic crisis or storm: Secondary | ICD-10-CM | POA: Diagnosis present

## 2019-06-08 DIAGNOSIS — I10 Essential (primary) hypertension: Secondary | ICD-10-CM

## 2019-06-08 DIAGNOSIS — E871 Hypo-osmolality and hyponatremia: Secondary | ICD-10-CM | POA: Diagnosis present

## 2019-06-08 DIAGNOSIS — R1310 Dysphagia, unspecified: Secondary | ICD-10-CM | POA: Diagnosis present

## 2019-06-08 DIAGNOSIS — Z8249 Family history of ischemic heart disease and other diseases of the circulatory system: Secondary | ICD-10-CM

## 2019-06-08 DIAGNOSIS — Z66 Do not resuscitate: Secondary | ICD-10-CM | POA: Diagnosis present

## 2019-06-08 DIAGNOSIS — I63511 Cerebral infarction due to unspecified occlusion or stenosis of right middle cerebral artery: Secondary | ICD-10-CM | POA: Diagnosis not present

## 2019-06-08 DIAGNOSIS — D649 Anemia, unspecified: Secondary | ICD-10-CM | POA: Diagnosis present

## 2019-06-08 DIAGNOSIS — I700 Atherosclerosis of aorta: Secondary | ICD-10-CM | POA: Diagnosis present

## 2019-06-08 DIAGNOSIS — I482 Chronic atrial fibrillation, unspecified: Secondary | ICD-10-CM | POA: Diagnosis present

## 2019-06-08 DIAGNOSIS — G8194 Hemiplegia, unspecified affecting left nondominant side: Secondary | ICD-10-CM | POA: Diagnosis present

## 2019-06-08 DIAGNOSIS — R4781 Slurred speech: Secondary | ICD-10-CM | POA: Diagnosis present

## 2019-06-08 DIAGNOSIS — E785 Hyperlipidemia, unspecified: Secondary | ICD-10-CM | POA: Diagnosis present

## 2019-06-08 DIAGNOSIS — E0590 Thyrotoxicosis, unspecified without thyrotoxic crisis or storm: Secondary | ICD-10-CM | POA: Diagnosis not present

## 2019-06-08 DIAGNOSIS — I63411 Cerebral infarction due to embolism of right middle cerebral artery: Principal | ICD-10-CM

## 2019-06-08 DIAGNOSIS — I4821 Permanent atrial fibrillation: Secondary | ICD-10-CM | POA: Diagnosis not present

## 2019-06-08 DIAGNOSIS — I509 Heart failure, unspecified: Secondary | ICD-10-CM

## 2019-06-08 DIAGNOSIS — Z20822 Contact with and (suspected) exposure to covid-19: Secondary | ICD-10-CM | POA: Diagnosis present

## 2019-06-08 DIAGNOSIS — I69222 Dysarthria following other nontraumatic intracranial hemorrhage: Secondary | ICD-10-CM

## 2019-06-08 DIAGNOSIS — Z87891 Personal history of nicotine dependence: Secondary | ICD-10-CM

## 2019-06-08 DIAGNOSIS — D72829 Elevated white blood cell count, unspecified: Secondary | ICD-10-CM | POA: Diagnosis not present

## 2019-06-08 DIAGNOSIS — Z885 Allergy status to narcotic agent status: Secondary | ICD-10-CM

## 2019-06-08 DIAGNOSIS — I5023 Acute on chronic systolic (congestive) heart failure: Secondary | ICD-10-CM | POA: Diagnosis not present

## 2019-06-08 DIAGNOSIS — R29714 NIHSS score 14: Secondary | ICD-10-CM | POA: Diagnosis present

## 2019-06-08 DIAGNOSIS — Z9049 Acquired absence of other specified parts of digestive tract: Secondary | ICD-10-CM

## 2019-06-08 DIAGNOSIS — Z79899 Other long term (current) drug therapy: Secondary | ICD-10-CM

## 2019-06-08 DIAGNOSIS — Z7901 Long term (current) use of anticoagulants: Secondary | ICD-10-CM | POA: Diagnosis not present

## 2019-06-08 DIAGNOSIS — R062 Wheezing: Secondary | ICD-10-CM | POA: Diagnosis not present

## 2019-06-08 DIAGNOSIS — Z85828 Personal history of other malignant neoplasm of skin: Secondary | ICD-10-CM

## 2019-06-08 DIAGNOSIS — G4700 Insomnia, unspecified: Secondary | ICD-10-CM | POA: Diagnosis present

## 2019-06-08 DIAGNOSIS — Z806 Family history of leukemia: Secondary | ICD-10-CM

## 2019-06-08 DIAGNOSIS — K449 Diaphragmatic hernia without obstruction or gangrene: Secondary | ICD-10-CM | POA: Diagnosis present

## 2019-06-08 DIAGNOSIS — I6340 Cerebral infarction due to embolism of unspecified cerebral artery: Secondary | ICD-10-CM | POA: Insufficient documentation

## 2019-06-08 DIAGNOSIS — Z96641 Presence of right artificial hip joint: Secondary | ICD-10-CM | POA: Diagnosis present

## 2019-06-08 DIAGNOSIS — F419 Anxiety disorder, unspecified: Secondary | ICD-10-CM | POA: Diagnosis present

## 2019-06-08 DIAGNOSIS — I5032 Chronic diastolic (congestive) heart failure: Secondary | ICD-10-CM | POA: Diagnosis present

## 2019-06-08 DIAGNOSIS — Z8679 Personal history of other diseases of the circulatory system: Secondary | ICD-10-CM | POA: Diagnosis not present

## 2019-06-08 DIAGNOSIS — R1312 Dysphagia, oropharyngeal phase: Secondary | ICD-10-CM | POA: Diagnosis not present

## 2019-06-08 DIAGNOSIS — Z96653 Presence of artificial knee joint, bilateral: Secondary | ICD-10-CM | POA: Diagnosis present

## 2019-06-08 LAB — COMPREHENSIVE METABOLIC PANEL
ALT: 15 U/L (ref 0–44)
AST: 19 U/L (ref 15–41)
Albumin: 3.3 g/dL — ABNORMAL LOW (ref 3.5–5.0)
Alkaline Phosphatase: 94 U/L (ref 38–126)
Anion gap: 12 (ref 5–15)
BUN: 9 mg/dL (ref 8–23)
CO2: 22 mmol/L (ref 22–32)
Calcium: 9.3 mg/dL (ref 8.9–10.3)
Chloride: 96 mmol/L — ABNORMAL LOW (ref 98–111)
Creatinine, Ser: 0.9 mg/dL (ref 0.44–1.00)
GFR calc Af Amer: >60 mL/min (ref >60)
GFR calc non Af Amer: 56 mL/min — ABNORMAL LOW (ref >60)
Glucose, Bld: 103 mg/dL — ABNORMAL HIGH (ref 70–99)
Potassium: 4.4 mmol/L (ref 3.5–5.1)
Sodium: 130 mmol/L — ABNORMAL LOW (ref 135–145)
Total Bilirubin: 0.5 mg/dL (ref 0.3–1.2)
Total Protein: 6.2 g/dL — ABNORMAL LOW (ref 6.5–8.1)

## 2019-06-08 LAB — CBG MONITORING, ED: Glucose-Capillary: 106 mg/dL — ABNORMAL HIGH (ref 70–99)

## 2019-06-08 LAB — CBC
HCT: 39 % (ref 36.0–46.0)
Hemoglobin: 12.3 g/dL (ref 12.0–15.0)
MCH: 27.3 pg (ref 26.0–34.0)
MCHC: 31.5 g/dL (ref 30.0–36.0)
MCV: 86.5 fL (ref 80.0–100.0)
Platelets: 427 10*3/uL — ABNORMAL HIGH (ref 150–400)
RBC: 4.51 MIL/uL (ref 3.87–5.11)
RDW: 22 % — ABNORMAL HIGH (ref 11.5–15.5)
WBC: 9 10*3/uL (ref 4.0–10.5)
nRBC: 0 % (ref 0.0–0.2)

## 2019-06-08 LAB — ETHANOL: Alcohol, Ethyl (B): <10 mg/dL (ref <10)

## 2019-06-08 LAB — DIFFERENTIAL
Abs Immature Granulocytes: 0.03 10*3/uL (ref 0.00–0.07)
Basophils Absolute: 0.1 10*3/uL (ref 0.0–0.1)
Basophils Relative: 1 %
Eosinophils Absolute: 0.1 10*3/uL (ref 0.0–0.5)
Eosinophils Relative: 2 %
Immature Granulocytes: 0 %
Lymphocytes Relative: 12 %
Lymphs Abs: 1.1 10*3/uL (ref 0.7–4.0)
Monocytes Absolute: 0.7 10*3/uL (ref 0.1–1.0)
Monocytes Relative: 7 %
Neutro Abs: 7.1 10*3/uL (ref 1.7–7.7)
Neutrophils Relative %: 78 %

## 2019-06-08 LAB — I-STAT CHEM 8, ED
BUN: 9 mg/dL (ref 8–23)
Calcium, Ion: 1.18 mmol/L (ref 1.15–1.40)
Chloride: 96 mmol/L — ABNORMAL LOW (ref 98–111)
Creatinine, Ser: 0.9 mg/dL (ref 0.44–1.00)
Glucose, Bld: 105 mg/dL — ABNORMAL HIGH (ref 70–99)
HCT: 39 % (ref 36.0–46.0)
Hemoglobin: 13.3 g/dL (ref 12.0–15.0)
Potassium: 4.1 mmol/L (ref 3.5–5.1)
Sodium: 128 mmol/L — ABNORMAL LOW (ref 135–145)
TCO2: 26 mmol/L (ref 22–32)

## 2019-06-08 LAB — URINALYSIS, ROUTINE W REFLEX MICROSCOPIC
Bilirubin Urine: NEGATIVE
Glucose, UA: NEGATIVE mg/dL
Hgb urine dipstick: NEGATIVE
Ketones, ur: NEGATIVE mg/dL
Nitrite: POSITIVE — AB
Protein, ur: NEGATIVE mg/dL
Specific Gravity, Urine: 1.042 — ABNORMAL HIGH (ref 1.005–1.030)
pH: 7 (ref 5.0–8.0)

## 2019-06-08 LAB — RAPID URINE DRUG SCREEN, HOSP PERFORMED
Amphetamines: NOT DETECTED
Barbiturates: NOT DETECTED
Benzodiazepines: NOT DETECTED
Cocaine: NOT DETECTED
Opiates: NOT DETECTED
Tetrahydrocannabinol: NOT DETECTED

## 2019-06-08 LAB — PROTIME-INR
INR: 1.1 (ref 0.8–1.2)
Prothrombin Time: 13.7 seconds (ref 11.4–15.2)

## 2019-06-08 LAB — APTT: aPTT: 27 seconds (ref 24–36)

## 2019-06-08 LAB — SARS CORONAVIRUS 2 (TAT 6-24 HRS): SARS Coronavirus 2: NEGATIVE

## 2019-06-08 MED ORDER — ACETAMINOPHEN 650 MG RE SUPP
650.0000 mg | RECTAL | Status: DC | PRN
  Administered 2019-06-11: 650 mg via RECTAL
  Filled 2019-06-08: qty 1

## 2019-06-08 MED ORDER — HEPARIN SODIUM (PORCINE) 5000 UNIT/ML IJ SOLN
5000.0000 [IU] | Freq: Three times a day (TID) | INTRAMUSCULAR | Status: DC
  Administered 2019-06-08 – 2019-06-14 (×17): 5000 [IU] via SUBCUTANEOUS
  Filled 2019-06-08 (×17): qty 1

## 2019-06-08 MED ORDER — SODIUM CHLORIDE 3 % IN NEBU
4.0000 mL | INHALATION_SOLUTION | RESPIRATORY_TRACT | Status: DC
  Administered 2019-06-08: 4 mL via RESPIRATORY_TRACT
  Filled 2019-06-08 (×3): qty 4

## 2019-06-08 MED ORDER — SENNOSIDES-DOCUSATE SODIUM 8.6-50 MG PO TABS: 1.0000 | ORAL_TABLET | Freq: Every evening | ORAL | Status: DC | PRN

## 2019-06-08 MED ORDER — HEPARIN SODIUM (PORCINE) 5000 UNIT/ML IJ SOLN
5000.0000 [IU] | Freq: Two times a day (BID) | INTRAMUSCULAR | Status: DC
  Administered 2019-06-08: 5000 [IU] via SUBCUTANEOUS
  Filled 2019-06-08: qty 1

## 2019-06-08 MED ORDER — IOHEXOL 350 MG/ML SOLN
75.0000 mL | Freq: Once | INTRAVENOUS | Status: AC | PRN
  Administered 2019-06-08: 75 mL via INTRAVENOUS

## 2019-06-08 MED ORDER — SODIUM CHLORIDE 0.9 % IV SOLN
INTRAVENOUS | Status: DC
  Administered 2019-06-11: 50 mL via INTRAVENOUS

## 2019-06-08 MED ORDER — STROKE: EARLY STAGES OF RECOVERY BOOK
Freq: Once | Status: AC
  Filled 2019-06-08: qty 1

## 2019-06-08 MED ORDER — ACETAMINOPHEN 160 MG/5ML PO SOLN
650.0000 mg | ORAL | Status: DC | PRN
  Administered 2019-06-11 – 2019-06-18 (×13): 650 mg
  Filled 2019-06-08 (×13): qty 20.3

## 2019-06-08 MED ORDER — METHIMAZOLE 5 MG PO TABS
5.0000 mg | ORAL_TABLET | Freq: Three times a day (TID) | ORAL | Status: DC
  Administered 2019-06-11: 5 mg via ORAL
  Filled 2019-06-08 (×11): qty 1

## 2019-06-08 MED ORDER — HYDRALAZINE HCL 20 MG/ML IJ SOLN
5.0000 mg | Freq: Four times a day (QID) | INTRAMUSCULAR | Status: DC | PRN
  Administered 2019-06-10: 5 mg via INTRAVENOUS
  Filled 2019-06-08 (×2): qty 1

## 2019-06-08 MED ORDER — ALBUMIN HUMAN 5 % IV SOLN
12.5000 g | Freq: Four times a day (QID) | INTRAVENOUS | Status: DC
  Administered 2019-06-08: 12.5 g via INTRAVENOUS
  Filled 2019-06-08 (×4): qty 250

## 2019-06-08 MED ORDER — DOCUSATE SODIUM 100 MG PO CAPS: 100.0000 mg | ORAL_CAPSULE | Freq: Every day | ORAL | Status: DC | PRN

## 2019-06-08 MED ORDER — SODIUM CHLORIDE 3 % IN NEBU
4.0000 mL | INHALATION_SOLUTION | Freq: Three times a day (TID) | RESPIRATORY_TRACT | Status: DC | PRN
  Filled 2019-06-08: qty 4

## 2019-06-08 MED ORDER — HYDRALAZINE HCL 20 MG/ML IJ SOLN: 5.0000 mg | Freq: Four times a day (QID) | INTRAMUSCULAR | Status: DC | PRN

## 2019-06-08 MED ORDER — PANTOPRAZOLE SODIUM 40 MG IV SOLR
40.0000 mg | INTRAVENOUS | Status: DC
  Administered 2019-06-08 – 2019-06-10 (×3): 40 mg via INTRAVENOUS
  Filled 2019-06-08 (×3): qty 40

## 2019-06-08 MED ORDER — SODIUM CHLORIDE 0.9 % IV SOLN: INTRAVENOUS | Status: DC

## 2019-06-08 MED ORDER — ACETAMINOPHEN 325 MG PO TABS
650.0000 mg | ORAL_TABLET | ORAL | Status: DC | PRN
  Administered 2019-06-13: 650 mg via ORAL
  Filled 2019-06-08 (×3): qty 2

## 2019-06-08 MED ORDER — ASPIRIN 300 MG RE SUPP
300.0000 mg | Freq: Once | RECTAL | Status: AC
  Administered 2019-06-08: 300 mg via RECTAL
  Filled 2019-06-08: qty 1

## 2019-06-08 MED ORDER — ASPIRIN 300 MG RE SUPP
300.0000 mg | Freq: Every day | RECTAL | Status: DC
  Administered 2019-06-09 – 2019-06-11 (×3): 300 mg via RECTAL
  Filled 2019-06-08 (×3): qty 1

## 2019-06-08 NOTE — Code Documentation (Signed)
Stroke Response Nurse Documentation Code Documentation  [name] is a 86 y.o. female arriving to [facility] ED via [facility] on 3/12 with past medical hx of HTN, hyperlipidemia, afib, pacemaker, and recent ICH. Code stroke was activated by EMS. Patient from SNF where she was LKW at 0800 and now complaining of slurred speech and left sided weakness with facial droop.   Stroke team at the bedside on patient arrival. Labs drawn and patient cleared for CT by Dr. Colin Broach. Patient to CT with team. NIHSS 16, see documentation for details and code stroke times. Patient with right gaze preference , left hemianopia, left facial droop, left arm weakness, left decreased sensation, dysarthria  and Visual  neglect on exam. VAN score positive.   The following imaging was completed:  CT, CTA head and neck. Patient is not a candidate for tPA due to recent [place]. Dr. Erlinda Hong discussed case with IR provider and decided patient is not an IR candidate. Care/Plan Q2 neuro/VS. Bedside handoff with ED RN Claiborne Billings.    Leverne Humbles Stroke Response RN

## 2019-06-08 NOTE — Plan of Care (Addendum)
Pt admitted to stepdown at 3W. Her left UE and LE strength much improved, but still has right gaze, right neglect, left facial droop and dysarthria. Difficulty with oral secretions. BP 170-190s. Decreased IVF to 50cc, had one dose of albumin, will d/c the rest. CXR unremarkable. Ordered 3% neb with NT suctioning. MRI not able to perform due to pacer, will repeat CT in am. BP goal 130-180.  Rosalin Hawking, MD PhD Stroke Neurology 06/08/2019 7:17 PM

## 2019-06-08 NOTE — ED Provider Notes (Addendum)
[facility] EMERGENCY DEPARTMENT Provider Note   CSN: [id number] Arrival date & time: 06/08/19  1046     History Chief Complaint  Patient presents with  . Code Stroke    CHARM BROTHER is a 86 y.o. female.  Patient is a 86 year old female who presents as a code stroke.  She was recently admitted to the hospital in February after she had a headache.  Imaging studies showed an intraparenchymal hemorrhage.  She was discharged to [facility] for rehab.  Apparently the staff saw her at 8:00 this morning and she was noted to be at her baseline.  Around 10:00 they went back to check on her noted she had slurred speech with some left-sided weakness.  She had reportedly had some dysarthria from her prior visit.  Code stroke was activated by EMS.  She was previously on anticoagulants for atrial fibrillation but has not been on them since this recent intraparenchymal hemorrhage.        Past Medical History:  Diagnosis Date  . Anxiety   . Atrial fibrillation ([place])   . Bradycardia   . Cancer ([place])    skin  . Cataract    removed bilat with lens both eyes   . Complication of anesthesia   . Difficulty sleeping   . Dysrhythmia    A FIB  . GERD (gastroesophageal reflux disease)   . H/O hiatal hernia   . History of kidney stones   . History of skin cancer   . History of transfusion   . Hyperlipidemia   . Hypertension   . Osteoarthritis   . Pacemaker   . Pneumonia   . PONV (postoperative nausea and vomiting)   . Shortness of breath    WITH EXERTION    Patient Active Problem List   Diagnosis Date Noted  . Cerebral embolism with cerebral infarction 06/08/2019  . Hyperlipidemia 05/24/2019  . Abdominal pain 05/21/2019  . ICH (intracerebral hemorrhage) ([place]) 05/19/2019  . V tach ([place]) 08/09/2018  . Intractable nausea and vomiting 08/09/2018  . Hyponatremia 08/09/2018  . Hypomagnesemia 08/09/2018  . Hyperthyroidism 08/09/2018  . Epigastric pain   .  Nonspecific chest pain 07/18/2018  . Anxiety 07/18/2018  . Insomnia 07/18/2018  . Complication of anesthesia   . Intractable headache 01/06/2017  . Intractable pain 01/04/2017  . Chronic anticoagulation 01/03/2017  . Neck pain 01/03/2017  . Acute bronchitis 06/08/2016  . Fall 09/24/2015  . Dizziness 09/24/2015  . Postoperative anemia due to acute blood loss 12/27/2013  . Postop Transfusion 12/27/2013  . OA (osteoarthritis) of hip 12/24/2013  . Preop cardiovascular exam 09/25/2013  . Cardiac pacemaker in situ 08/29/2008  . Essential hypertension 08/21/2008  . ATRIAL FIBRILLATION 08/21/2008  . Bradycardia 08/21/2008  . PNEUMONIA 08/21/2008  . GERD 08/21/2008  . NEPHROLITHIASIS 08/21/2008  . Osteoarthritis 08/21/2008    Past Surgical History:  Procedure Laterality Date  . APPENDECTOMY  1960  . ARTHROPLASTY  1994   Left total knee  . ARTHROPLASTY  2000   Total right knee  . BACK SURGERY    . CATARACT EXTRACTION, BILATERAL     with lens implants  . CHOLECYSTECTOMY    . COLONOSCOPY    . DG SELECTED [facility] ONLY  2002   Dilation  . Microdiskectomy  05/2004   Left L4-L5  . PACEMAKER INSERTION    . POLYPECTOMY    . TOTAL ABDOMINAL HYSTERECTOMY  1969  . TOTAL HIP ARTHROPLASTY Right 12/24/2013   Procedure:  RIGHT TOTAL HIP ARTHROPLASTY ANTERIOR APPROACH;  Surgeon: Gearlean Alf, MD;  Location: [facility];  Service: Orthopedics;  Laterality: Right;  . UPPER GASTROINTESTINAL ENDOSCOPY       OB History   No obstetric history on file.     Family History  Problem Relation Age of Onset  . Heart disease Mother   . Leukemia Father   . Colon cancer Neg Hx   . Esophageal cancer Neg Hx   . Stomach cancer Neg Hx   . Pancreatic cancer Neg Hx   . Liver disease Neg Hx   . Colon polyps Neg Hx     Social History   Tobacco Use  . Smoking status: Former Smoker    Quit date: 12/18/1973    Years since quitting: 45.5  . Smokeless tobacco: Never Used  . Tobacco comment: Smoker for  25 years and quit at around age 18  Substance Use Topics  . Alcohol use: No  . Drug use: No    Home Medications Prior to Admission medications   Medication Sig Start Date End Date Taking? Authorizing Provider  acetaminophen (TYLENOL) 325 MG tablet Take 650 mg by mouth every 6 (six) hours as needed for mild pain.     [provider]  amLODipine (NORVASC) 10 MG tablet Take 10 mg by mouth every morning.     [provider]  clonazePAM (KLONOPIN) 0.5 MG tablet Take 0.5 mg by mouth at bedtime as needed for anxiety.  03/18/15   [provider]  docusate sodium (COLACE) 100 MG capsule Take 100 mg by mouth daily as needed for mild constipation.    [provider]  feeding supplement, ENSURE ENLIVE, (ENSURE ENLIVE) LIQD Take 237 mLs by mouth 2 (two) times daily between meals. 08/11/18   Mercy Riding, MD  ferrous sulfate 325 (65 FE) MG tablet Take 325 mg by mouth 2 (two) times daily. 04/18/19   [provider]  furosemide (LASIX) 20 MG tablet Take 2 tablets (40 mg total) by mouth daily. 04/02/19 07/01/19  Evans Lance, MD  hydrALAZINE (APRESOLINE) 25 MG tablet Take 3 tablets (75 mg total) by mouth every 8 (eight) hours. 05/24/19   Donzetta Starch, NP  HYDROcodone-acetaminophen (NORCO) 5-325 MG tablet Take 0.5 tablets by mouth every 6 (six) hours as needed for moderate pain.     [provider]  irbesartan (AVAPRO) 300 MG tablet Take 300 mg by mouth every morning.    [provider]  methimazole (TAPAZOLE) 5 MG tablet Take 5 mg by mouth 3 (three) times daily.    [provider]  omeprazole (PRILOSEC) 20 MG capsule TAKE 1 CAPSULE (20 MG TOTAL) BY MOUTH DAILY. TAKE 30-60 MIN BEFORE DINNER Patient taking differently: Take 20 mg by mouth daily before supper. Take 30-60 min before dinner 02/19/19   Levin Erp, [place]  polyethylene glycol (MIRALAX / GLYCOLAX) 17 g packet Take 17 g by mouth daily as needed for mild constipation. 05/24/19    Donzetta Starch, NP  potassium chloride SA (K-DUR) 20 MEQ tablet Take 20 mEq by mouth 2 (two) times daily. 04/29/18   [provider]    Allergies    Morphine and related  Review of Systems   Review of Systems  Unable to perform ROS: Mental status change    Physical Exam Updated Vital Signs BP (!) 174/61   Pulse 72   Temp 98.5 F (36.9 C) (Oral)   Resp (!) 22   SpO2  98%   Physical Exam Constitutional:      Appearance: She is well-developed.  HENT:     Head: Normocephalic and atraumatic.  Eyes:     Pupils: Pupils are equal, round, and reactive to light.  Cardiovascular:     Rate and Rhythm: Normal rate and regular rhythm.     Heart sounds: Normal heart sounds.  Pulmonary:     Effort: Pulmonary effort is normal. No respiratory distress.     Breath sounds: Normal breath sounds. No wheezing or rales.  Chest:     Chest wall: No tenderness.  Abdominal:     General: Bowel sounds are normal.     Palpations: Abdomen is soft.     Tenderness: There is no abdominal tenderness. There is no guarding or rebound.  Musculoskeletal:        General: Normal range of motion.     Cervical back: Normal range of motion and neck supple.  Lymphadenopathy:     Cervical: No cervical adenopathy.  Skin:    General: Skin is warm and dry.     Findings: No rash.  Neurological:     Mental Status: She is alert.     Comments: Patient has dysarthria.  There is left-sided facial drooping and left-sided extremity weakness     ED Results / Procedures / Treatments   Labs (all labs ordered are listed, but only abnormal results are displayed) Labs Reviewed  CBC - Abnormal; Notable for the following components:      Result Value   RDW 22.0 (*)    Platelets 427 (*)    All other components within normal limits  COMPREHENSIVE METABOLIC PANEL - Abnormal; Notable for the following components:   Sodium 130 (*)    Chloride 96 (*)    Glucose, Bld 103 (*)    Total Protein 6.2 (*)    Albumin 3.3  (*)    GFR calc non Af Amer 56 (*)    All other components within normal limits  CBG MONITORING, ED - Abnormal; Notable for the following components:   Glucose-Capillary 106 (*)    All other components within normal limits  I-STAT CHEM 8, ED - Abnormal; Notable for the following components:   Sodium 128 (*)    Chloride 96 (*)    Glucose, Bld 105 (*)    All other components within normal limits  SARS CORONAVIRUS 2 (TAT 6-24 HRS)  ETHANOL  PROTIME-INR  APTT  DIFFERENTIAL  RAPID URINE DRUG SCREEN, HOSP PERFORMED  URINALYSIS, ROUTINE W REFLEX MICROSCOPIC    EKG None  Radiology CT ANGIO HEAD W OR WO CONTRAST  Result Date: 06/08/2019 CLINICAL DATA:  Left-sided weakness. EXAM: CT ANGIOGRAPHY HEAD AND NECK TECHNIQUE: Multidetector CT imaging of the head and neck was performed using the standard protocol during bolus administration of intravenous contrast. Multiplanar CT image reconstructions and MIPs were obtained to evaluate the vascular anatomy. Carotid stenosis measurements (when applicable) are obtained utilizing NASCET criteria, using the distal internal carotid diameter as the denominator. CONTRAST:  71mL OMNIPAQUE IOHEXOL 350 MG/ML SOLN COMPARISON:  CT angiogram FINDINGS: CT HEAD FINDINGS Reported separately. CTA NECK FINDINGS Aortic arch: Standard branching. Imaged portion shows no evidence of aneurysm or dissection. Calcified plaques are seen in the aortic arch and at the origin of the major arch vessel without stenosis. Right carotid system: Normal course and caliber of the right common carotid artery. Calcified atheromatous plaques are seen in the right carotid bifurcation without hemodynamically significant stenosis. Increased tortuosity of  the cervical segment of the right ACA without stenosis. Left carotid system: Normal course and caliber of the left common carotid artery. Calcified atherosclerotic plaque in the left carotid bifurcation without hemodynamically significant stenosis.  Increased tortuosity of the cervical segment of the left ICA, without stenosis. Vertebral arteries: Codominant. No evidence of dissection, stenosis or occlusion. Skeleton: Degenerative changes of the cervical spine with multilevel neural foraminal narrowing. Degenerative changes of the sternoclavicular joints. No aggressive lesion identified. Other neck: Redemonstrated goiter and esophageal diverticulum. Upper chest: Negative Review of the MIP images confirms the above findings CTA HEAD FINDINGS Anterior circulation: Calcified plaques are seen in the bilateral carotid siphons, without hemodynamically significant stenosis. Short segment of attenuation of the distal right M1/MCA at the level of the bifurcation with non opacification of the origin of the superior division branch (series 5, image 118) consistent with occlusion at the origin of the superior division branch. Comparison with prior CT angiogram also demonstrate missing superior division branches. The left MCA vascular tree in bilateral ACA vascular trees remain patent. Posterior circulation: Calcified plaque is seen at the left vertebral artery at the dural margin, without stenosis. The intracranial bilateral vertebral arteries and the basilar artery have normal course and caliber. Mild luminal irregularity throughout the bilateral PCAs, suggesting intracranial atherosclerotic disease. Venous sinuses: As permitted by contrast timing, patent. Anatomic variants: None Review of the MIP images confirms the above findings IMPRESSION: Short segment of attenuation of the distal right M1/MCA at the level of the bifurcation with non opacification of the origin of the superior division branch. Comparison with the prior CT angiogram also demonstrate missing superior division branches. I telephoned the critical test results to Dr. Erlinda Hong at 11:19 a.m. on 06/08/2019. Aortic Atherosclerosis (ICD10-I70.0). Electronically Signed   By: Pedro Earls M.D.   On:  06/08/2019 11:44   CT ANGIO NECK W OR WO CONTRAST  Result Date: 06/08/2019 CLINICAL DATA:  Left-sided weakness. EXAM: CT ANGIOGRAPHY HEAD AND NECK TECHNIQUE: Multidetector CT imaging of the head and neck was performed using the standard protocol during bolus administration of intravenous contrast. Multiplanar CT image reconstructions and MIPs were obtained to evaluate the vascular anatomy. Carotid stenosis measurements (when applicable) are obtained utilizing NASCET criteria, using the distal internal carotid diameter as the denominator. CONTRAST:  5mL OMNIPAQUE IOHEXOL 350 MG/ML SOLN COMPARISON:  CT angiogram FINDINGS: CT HEAD FINDINGS Reported separately. CTA NECK FINDINGS Aortic arch: Standard branching. Imaged portion shows no evidence of aneurysm or dissection. Calcified plaques are seen in the aortic arch and at the origin of the major arch vessel without stenosis. Right carotid system: Normal course and caliber of the right common carotid artery. Calcified atheromatous plaques are seen in the right carotid bifurcation without hemodynamically significant stenosis. Increased tortuosity of the cervical segment of the right ACA without stenosis. Left carotid system: Normal course and caliber of the left common carotid artery. Calcified atherosclerotic plaque in the left carotid bifurcation without hemodynamically significant stenosis. Increased tortuosity of the cervical segment of the left ICA, without stenosis. Vertebral arteries: Codominant. No evidence of dissection, stenosis or occlusion. Skeleton: Degenerative changes of the cervical spine with multilevel neural foraminal narrowing. Degenerative changes of the sternoclavicular joints. No aggressive lesion identified. Other neck: Redemonstrated goiter and esophageal diverticulum. Upper chest: Negative Review of the MIP images confirms the above findings CTA HEAD FINDINGS Anterior circulation: Calcified plaques are seen in the bilateral carotid siphons,  without hemodynamically significant stenosis. Short segment of attenuation of the distal right M1/MCA  at the level of the bifurcation with non opacification of the origin of the superior division branch (series 5, image 118) consistent with occlusion at the origin of the superior division branch. Comparison with prior CT angiogram also demonstrate missing superior division branches. The left MCA vascular tree in bilateral ACA vascular trees remain patent. Posterior circulation: Calcified plaque is seen at the left vertebral artery at the dural margin, without stenosis. The intracranial bilateral vertebral arteries and the basilar artery have normal course and caliber. Mild luminal irregularity throughout the bilateral PCAs, suggesting intracranial atherosclerotic disease. Venous sinuses: As permitted by contrast timing, patent. Anatomic variants: None Review of the MIP images confirms the above findings IMPRESSION: Short segment of attenuation of the distal right M1/MCA at the level of the bifurcation with non opacification of the origin of the superior division branch. Comparison with the prior CT angiogram also demonstrate missing superior division branches. I telephoned the critical test results to Dr. Erlinda Hong at 11:19 a.m. on 06/08/2019. Aortic Atherosclerosis (ICD10-I70.0). Electronically Signed   By: Pedro Earls M.D.   On: 06/08/2019 11:44   CT HEAD CODE STROKE WO CONTRAST  Result Date: 06/08/2019 CLINICAL DATA:  Code stroke. EXAM: CT HEAD WITHOUT CONTRAST TECHNIQUE: Contiguous axial images were obtained from the base of the skull through the vertex without intravenous contrast. COMPARISON:  Head CT obtained February 21st 2021. FINDINGS: Brain: New hypodensity involving the right insula, suggesting acute infarct. Redemonstrated subacute bleeding in the left occipital lobe measuring 2.5 x 1.6 cm (prior 3.1 x 2.0 cm) with surrounding vasogenic edema. Resolution of the extension of the bleed into  the occipital horn of the left lateral ventricle. No new hemorrhage identified. Vascular: No hyperdense vessel or unexpected calcification. Skull: Normal. Negative for fracture or focal lesion. Sinuses/Orbits: No acute finding. Other: None. ASPECTS [facility] Stroke Program Early CT Score) - Ganglionic level infarction (caudate, lentiform nuclei, internal capsule, insula, M1-M3 cortex): 5 - Supraganglionic infarction (M4-M6 cortex): 3 Total score (0-10 with 10 being normal): 9 IMPRESSION: 1. New hypodensity involving the right insula, suggesting acute infarct. 2. Redemonstrated is subacute bleeding in the left occipital lobe with surrounding vasogenic edema. Resolution of the extension of the bleed into the occipital horn of the left lateral ventricle. No new hemorrhage. 3. ASPECTS is 9 These results were called by telephone at the time of interpretation on 06/08/2019 at 11:00 am to provider Dr. Erlinda Hong, who verbally acknowledged these results. Electronically Signed   By: Pedro Earls M.D.   On: 06/08/2019 11:07    Procedures Procedures (including critical care time)  Medications Ordered in ED Medications  albumin human 5 % solution 12.5 g (has no administration in time range)  0.9 %  sodium chloride infusion ( Intravenous Bolus from Bag 06/08/19 1231)  iohexol (OMNIPAQUE) 350 MG/ML injection 75 mL (75 mLs Intravenous Contrast Given 06/08/19 1108)    ED Course  I have reviewed the triage vital signs and the nursing notes.  Pertinent labs & imaging results that were available during my care of the patient were reviewed by me and considered in my medical decision making (see chart for details).    MDM Rules/Calculators/A&P                      Patient presents as a code stroke.  CT scan shows evidence of a likely new acute ischemic stroke.  There are some residual changes from her prior hemorrhage but no new hemorrhage.  She  was not a candidate for TPA given her recent hemorrhage.  She is  not a candidate for LVO.  Her sodium level is slightly low.  Other labs are nonconcerning.  She will be admitted to hospitalist service for further treatment.  I spoke with Dr.Zhang who will admit the patient.    Dr. Erlinda Hong with neurology has seen the patient.  He recommends keeping her blood pressure between [id number] and [id number] systolic.  She did have LVO symptoms on arrival.  Her CTA showed possible clot but no complete occlusion.  He has spoken with the family who at this point was not wanting to proceed with further aggressive interventions.  CRITICAL CARE Performed by: Malvin Johns Total critical care time: 60 minutes Critical care time was exclusive of separately billable procedures and treating other patients. Critical care was necessary to treat or prevent imminent or life-threatening deterioration. Critical care was time spent personally by me on the following activities: development of treatment plan with patient and/or surrogate as well as nursing, discussions with consultants, evaluation of patient's response to treatment, examination of patient, obtaining history from patient or surrogate, ordering and performing treatments and interventions, ordering and review of laboratory studies, ordering and review of radiographic studies, pulse oximetry and re-evaluation of patient's condition.  Final Clinical Impression(s) / ED Diagnoses Final diagnoses:  Cerebral infarction, unspecified mechanism [facility])    Rx / [place] Orders ED Discharge Orders    None       Malvin Johns, MD 06/08/19 1233    Malvin Johns, MD 06/08/19 1340

## 2019-06-08 NOTE — ED Notes (Signed)
MRI dept. contacted this RN & stated that d/t her pacemaker, she cannot receive this scan, ordering physician notified.

## 2019-06-08 NOTE — ED Triage Notes (Signed)
Patient presents toe ED via [facility] from [facility], states patient was there for rehab. And was suppose to be discharged home, staff states that patient was normal at 0800 am went back to check on her at 1000 am and she has slurred speech , left sided weakness and drawning to left side of her face.

## 2019-06-08 NOTE — Consult Note (Signed)
Stroke Neurology Consultation Note  Consult Requested by: Dr. Tamera Punt  Reason for Consult: code stroke  Consult Date: 06/08/19  The history was obtained from the EMS.  During history and examination, all items were able to obtain unless otherwise noted.  History of Present Illness:  DAYANNE ELFERING is a 86 y.o. Caucasian female with PMH of hypertension, hyperlipidemia, pacer for bradycardia, afib currently not on [facility] and recent left occipital ICH 3 weeks ago presented from SNF to ER for code stroke.  As per EMS, pt was about to be discharged from SNF today, last seen baseline about 8:30 AM.  Shortly after, nursing SNF found she had right gaze, left facial droop, slurred speech, right hemiplegia.  EMS was called, patient sent to ER for evaluation.   Patient was admitted 3 weeks ago for left occipital ICH in the setting of Lovenox for A. fib treatment.  She was reversed with protamine, treated with BP management and PT/OT.  She was sent to SNF on discharge.  As per son, patient was able to walk with walker but needed 1 person assist, she was not independent yet.  However, before it ICH, patient was independent at home.  CT showed evolving left ICH stable with the surrounding cerebral edema.  No other ICH but right insular cortex ischemic attenuation.  CT head and neck showed right superior branch of M2 decreased caliber with intermittent stenosis concerning for nonocclusive clot comparing with her CTA 3 weeks ago.  After CT, patient left arm and leg weakness much improved, however still has right gaze, left neglect and left facial droop with slurring speech.  Discussed with son, given patient current situation, 53 year old, recent ICH, mRS = 3 post ICH, improving left-sided weakness, DNR status, patient may have high risk of recurrent ICH, hemorrhagic conversion, difficult extubation.  Son was in agreement not to pursue further intervention at this time.  Patient was given IV fluid and albumin for  permissive hypertension.  LSN: 8:30 AM tPA Given: No: Recent ICH  Past Medical History:  Diagnosis Date  . Anxiety   . Atrial fibrillation ([place])   . Bradycardia   . Cancer ([place])    skin  . Cataract    removed bilat with lens both eyes   . Complication of anesthesia   . Difficulty sleeping   . Dysrhythmia    A FIB  . GERD (gastroesophageal reflux disease)   . H/O hiatal hernia   . History of kidney stones   . History of skin cancer   . History of transfusion   . Hyperlipidemia   . Hypertension   . Osteoarthritis   . Pacemaker   . Pneumonia   . PONV (postoperative nausea and vomiting)   . Shortness of breath    WITH EXERTION    Past Surgical History:  Procedure Laterality Date  . APPENDECTOMY  1960  . ARTHROPLASTY  1994   Left total knee  . ARTHROPLASTY  2000   Total right knee  . BACK SURGERY    . CATARACT EXTRACTION, BILATERAL     with lens implants  . CHOLECYSTECTOMY    . COLONOSCOPY    . DG SELECTED HSG [facility]DC ONLY  2002   Dilation  . Microdiskectomy  05/2004   Left L4-L5  . PACEMAKER INSERTION    . POLYPECTOMY    . TOTAL ABDOMINAL HYSTERECTOMY  1969  . TOTAL HIP ARTHROPLASTY Right 12/24/2013   Procedure: RIGHT TOTAL HIP ARTHROPLASTY ANTERIOR APPROACH;  Surgeon: Gearlean Alf,  MD;  Location: [facility];  Service: Orthopedics;  Laterality: Right;  . UPPER GASTROINTESTINAL ENDOSCOPY      Family History  Problem Relation Age of Onset  . Heart disease Mother   . Leukemia Father   . Colon cancer Neg Hx   . Esophageal cancer Neg Hx   . Stomach cancer Neg Hx   . Pancreatic cancer Neg Hx   . Liver disease Neg Hx   . Colon polyps Neg Hx     Social History:  reports that she quit smoking about 45 years ago. She has never used smokeless tobacco. She reports that she does not drink alcohol or use drugs.  Allergies:  Allergies  Allergen Reactions  . Morphine And Related Nausea And Vomiting    No current facility-administered medications on file prior to  encounter.   Current Outpatient Medications on File Prior to Encounter  Medication Sig Dispense Refill  . acetaminophen (TYLENOL) 325 MG tablet Take 650 mg by mouth every 6 (six) hours as needed for mild pain.     Marland Kitchen amLODipine (NORVASC) 10 MG tablet Take 10 mg by mouth every morning.     . clonazePAM (KLONOPIN) 0.5 MG tablet Take 0.5 mg by mouth at bedtime as needed for anxiety.     . docusate sodium (COLACE) 100 MG capsule Take 100 mg by mouth daily as needed for mild constipation.    . feeding supplement, ENSURE ENLIVE, (ENSURE ENLIVE) LIQD Take 237 mLs by mouth 2 (two) times daily between meals. 60 Bottle 0  . ferrous sulfate 325 (65 FE) MG tablet Take 325 mg by mouth 2 (two) times daily.    . furosemide (LASIX) 20 MG tablet Take 2 tablets (40 mg total) by mouth daily. 180 tablet 3  . hydrALAZINE (APRESOLINE) 25 MG tablet Take 3 tablets (75 mg total) by mouth every 8 (eight) hours.    Marland Kitchen HYDROcodone-acetaminophen (NORCO) 5-325 MG tablet Take 0.5 tablets by mouth every 6 (six) hours as needed for moderate pain.     Marland Kitchen irbesartan (AVAPRO) 300 MG tablet Take 300 mg by mouth every morning.    . methimazole (TAPAZOLE) 5 MG tablet Take 5 mg by mouth 3 (three) times daily.    Marland Kitchen omeprazole (PRILOSEC) 20 MG capsule TAKE 1 CAPSULE (20 MG TOTAL) BY MOUTH DAILY. TAKE 30-60 MIN BEFORE DINNER (Patient taking differently: Take 20 mg by mouth daily before supper. Take 30-60 min before dinner) 90 capsule 3  . polyethylene glycol (MIRALAX / GLYCOLAX) 17 g packet Take 17 g by mouth daily as needed for mild constipation. 14 each 0  . potassium chloride SA (K-DUR) 20 MEQ tablet Take 20 mEq by mouth 2 (two) times daily.      Review of Systems: A full ROS was attempted today and was not able to be performed due to slurry speech and acute neuro changes.   Physical Examination: Temp:  [98.5 F (36.9 C)] 98.5 F (36.9 C) (03/12 1133) Pulse Rate:  [66-74] 67 (03/12 1245) Resp:  [16-25] 20 (03/12 1245) BP:  (152-175)/(57-106) 175/106 (03/12 1245) SpO2:  [97 %-100 %] 98 % (03/12 1245)  General - well nourished, well developed, sleepy and lethargic.    Ophthalmologic - fundi not visualized due to noncooperation.    Cardiovascular - irregularly irregular heart rate and rhythm  Neuro - sleepy and lethargic, open eyes on voice, moderate to severe dysarthria, orientated to time, place, situation and people.  No aphasia, following all simple commands, however, paucity of speech,  able to repeat simple sentences.  Right gaze preference, not able to cross midline.  Left neglect, not blinking to visual threat on the left, blinking to visual threat on the right.  Left facial droop.  Tongue midline.  Left upper extremity initially flaccid but then improved to 3+/5.  Left lower extremity initially flaccid and then improved to 4/5.  DTR 1+, no Babinski. Sensation, coordination not corporative and gait not tested.  NIH Stroke Scale  Level Of Consciousness 0=Alert; keenly responsive 1=Arouse to minor stimulation 2=Requires repeated stimulation to arouse or movements to pain 3=postures or unresponsive 1  LOC Questions to Month and Age 45=Answers both questions correctly 1=Answers one question correctly or dysarthria/intubated/trauma/language barrier 2=Answers neither question correctly or aphasia 0  LOC Commands      -Open/Close eyes     -Open/close grip     -Pantomime commands if communication barrier 0=Performs both tasks correctly 1=Performs one task correctly 2=Performs neighter task correctly 0  Best Gaze     -Only assess horizontal gaze 0=Normal 1=Partial gaze palsy 2=Forced deviation, or total gaze paresis 2  Visual 0=No visual loss 1=Partial hemianopia 2=Complete hemianopia 3=Bilateral hemianopia (blind including cortical blindness) 2  Facial Palsy     -Use grimace if obtunded 0=Normal symmetrical movement 1=Minor paralysis (asymmetry) 2=Partial paralysis (lower face) 3=Complete paralysis  (upper and lower face) 2  Motor  0=No drift for 10/5 seconds 1=Drift, but does not hit bed 2=Some antigravity effort, hits  bed 3=No effort against gravity, limb falls 4=No movement 0=Amputation/joint fusion Right Arm 0     Leg 0    Left Arm 1     Leg 1  Limb Ataxia     - FNT/HTS 0=Absent or does not understand or paralyzed or amputation/joint fusion 1=Present in one limb 2=Present in two limbs 0  Sensory 0=Normal 1=Mild to moderate sensory loss 2=Severe to total sensory loss or coma/unresponsive 1  Best Language 0=No aphasia, normal 1=Mild to moderate aphasia 2=Severe aphasia 3=Mute, global aphasia, or coma/unresponsive 0  Dysarthria 0=Normal 1=Mild to moderate 2=Severe, unintelligible or mute/anarthric 0=intubated/unable to test 2  Extinction/Neglect 0=No abnormality 1=visual/tactile/auditory/spatia/personal inattention/Extinction to bilateral simultaneous stimulation 2=Profound neglect/extinction more than 1 modality  2  Total   14     Data Reviewed: CT ANGIO HEAD W OR WO CONTRAST  Result Date: 06/08/2019 CLINICAL DATA:  Left-sided weakness. EXAM: CT ANGIOGRAPHY HEAD AND NECK TECHNIQUE: Multidetector CT imaging of the head and neck was performed using the standard protocol during bolus administration of intravenous contrast. Multiplanar CT image reconstructions and MIPs were obtained to evaluate the vascular anatomy. Carotid stenosis measurements (when applicable) are obtained utilizing NASCET criteria, using the distal internal carotid diameter as the denominator. CONTRAST:  42mL OMNIPAQUE IOHEXOL 350 MG/ML SOLN COMPARISON:  CT angiogram FINDINGS: CT HEAD FINDINGS Reported separately. CTA NECK FINDINGS Aortic arch: Standard branching. Imaged portion shows no evidence of aneurysm or dissection. Calcified plaques are seen in the aortic arch and at the origin of the major arch vessel without stenosis. Right carotid system: Normal course and caliber of the right common carotid  artery. Calcified atheromatous plaques are seen in the right carotid bifurcation without hemodynamically significant stenosis. Increased tortuosity of the cervical segment of the right ACA without stenosis. Left carotid system: Normal course and caliber of the left common carotid artery. Calcified atherosclerotic plaque in the left carotid bifurcation without hemodynamically significant stenosis. Increased tortuosity of the cervical segment of the left ICA, without stenosis. Vertebral arteries: Codominant. No evidence of dissection,  stenosis or occlusion. Skeleton: Degenerative changes of the cervical spine with multilevel neural foraminal narrowing. Degenerative changes of the sternoclavicular joints. No aggressive lesion identified. Other neck: Redemonstrated goiter and esophageal diverticulum. Upper chest: Negative Review of the MIP images confirms the above findings CTA HEAD FINDINGS Anterior circulation: Calcified plaques are seen in the bilateral carotid siphons, without hemodynamically significant stenosis. Short segment of attenuation of the distal right M1/MCA at the level of the bifurcation with non opacification of the origin of the superior division branch (series 5, image 118) consistent with occlusion at the origin of the superior division branch. Comparison with prior CT angiogram also demonstrate missing superior division branches. The left MCA vascular tree in bilateral ACA vascular trees remain patent. Posterior circulation: Calcified plaque is seen at the left vertebral artery at the dural margin, without stenosis. The intracranial bilateral vertebral arteries and the basilar artery have normal course and caliber. Mild luminal irregularity throughout the bilateral PCAs, suggesting intracranial atherosclerotic disease. Venous sinuses: As permitted by contrast timing, patent. Anatomic variants: None Review of the MIP images confirms the above findings IMPRESSION: Short segment of attenuation of the  distal right M1/MCA at the level of the bifurcation with non opacification of the origin of the superior division branch. Comparison with the prior CT angiogram also demonstrate missing superior division branches. I telephoned the critical test results to Dr. Erlinda Hong at 11:19 a.m. on 06/08/2019. Aortic Atherosclerosis (ICD10-I70.0). Electronically Signed   By: Pedro Earls M.D.   On: 06/08/2019 11:44   CT Angio Head W or Wo Contrast  Result Date: 05/18/2019 CLINICAL DATA:  Subarachnoid hemorrhage suspected. New onset headache beginning last night the coming worse today. Confusion and blurred vision. EXAM: CT ANGIOGRAPHY HEAD AND NECK TECHNIQUE: Multidetector CT imaging of the head and neck was performed using the standard protocol during bolus administration of intravenous contrast. Multiplanar CT image reconstructions and MIPs were obtained to evaluate the vascular anatomy. Carotid stenosis measurements (when applicable) are obtained utilizing NASCET criteria, using the distal internal carotid diameter as the denominator. CONTRAST:  63mL OMNIPAQUE IOHEXOL 350 MG/ML SOLN COMPARISON:  CT of the cervical spine 10/26/2018. CT head without contrast 03/27/18. CT angiogram of the head and neck 01/03/2017. FINDINGS: CT HEAD FINDINGS Brain: No acute infarct, hemorrhage, or mass lesion is present. Mild white matter changes are stable. The ventricles are of normal size. No significant extraaxial fluid collection is present. No subarachnoid hemorrhage is present. The brainstem and cerebellum are within normal limits. Vascular: Atherosclerotic changes are noted within the cavernous internal carotid arteries bilaterally. No hyperdense vessel is present. Skull: Calvarium is intact. No focal lytic or blastic lesions are present. No significant extracranial soft tissue lesion is present. Sinuses: The paranasal sinuses and mastoid air cells are clear. Bilateral lens replacements are noted. Orbits: Bilateral lens  replacements are noted. Globes and orbits are otherwise unremarkable. Review of the MIP images confirms the above findings CTA NECK FINDINGS Aortic arch: A 3 vessel arch configuration is present. Atherosclerotic changes are noted at the origin of the left subclavian artery without significant stenosis in the more distal aorta without aneurysm. Right carotid system: The right common carotid artery is within normal limits. Atherosclerotic changes are noted at the bifurcation. Moderate tortuosity is present without a significant stenosis relative to the more distal vessel. Left carotid system: The left common carotid artery is within normal limits. Atherosclerotic calcifications are present at the bifurcation without significant stenosis. Moderate tortuosity is present in the proximal left ICA  without a significant stenosis relative to the more distal vessels. Vertebral arteries: The vertebral arteries are codominant. Both vertebral arteries originate from the subclavian arteries without significant stenosis. There is no significant stenosis of either vertebral artery in the neck. Skeleton: Multilevel degenerative changes are again seen within the cervical spine. Grade 1 degenerative anterolisthesis at C3-4 and C4-5 stable. Chronic loss of endplate height is evident C5-6 and C6-7. Other neck: Prominent thyroid goiter is again noted. The thyroid is diffusely enlarged and heterogeneous. No dominant nodule is present. No followup recommended (ref: J Am Coll Radiol. 2015 Feb;12(2): 143-50). Upper chest: The lung apices are clear. Thoracic inlet is within normal limits. Review of the MIP images confirms the above findings CTA HEAD FINDINGS Anterior circulation: Atherosclerotic calcifications are present in the cavernous internal carotid arteries without significant stenosis relative to the ICA terminus. No aneurysm is present. The A1 and M1 segments are normal. The anterior communicating artery is patent. The ACA and MCA  branch vessels are within normal limits. Posterior circulation: The vertebral arteries are codominant. PICA origins are visualized and normal. The basilar artery is normal. Both posterior cerebral arteries originate from basilar tip. A right posterior communicating artery is patent. The PCA branch vessels are within normal limits bilaterally. Venous sinuses: The dural sinuses are patent. The straight sinus deep cerebral veins are intact. Cortical veins are unremarkable. Anatomic variants: None Review of the MIP images confirms the above findings IMPRESSION: 1. No subarachnoid hemorrhage. Normal CT appearance of the noncontrast head. 2. Moderate tortuosity of the cervical internal carotid arteries bilaterally without significant stenosis relative to the more distal vessels. 3. Mild bilateral carotid bifurcation atherosclerotic changes without significant stenosis. 4. Additional atherosclerotic changes at the aortic arch and within the cavernous internal carotid arteries without significant stenosis. 5. Multilevel spondylosis of the cervical spine. 6. Aortic Atherosclerosis (ICD10-I70.0). Electronically Signed   By: San Morelle M.D.   On: 05/18/2019 14:40   Chest 2 View  Result Date: 05/19/2019 CLINICAL DATA:  Headache. EXAM: CHEST - 2 VIEW COMPARISON:  October 18, 2018 FINDINGS: There is a left-sided single lead pacemaker in place. There is no pneumothorax. There may be small bilateral pleural effusions. The heart size is enlarged. Aortic calcifications are noted. Bibasilar airspace disease is noted and favored to represent atelectasis. Vascular congestion is noted. There is no acute osseous abnormality. IMPRESSION: 1. Bibasilar airspace disease favored to represent atelectasis. Small bilateral pleural effusions. 2. Cardiomegaly with mild vascular congestion. 3. Left-sided pacemaker. Electronically Signed   By: Constance Holster M.D.   On: 05/19/2019 23:53   CT HEAD WO CONTRAST  Result Date:  05/21/2019 CLINICAL DATA:  Cerebral hemorrhage EXAM: CT HEAD WITHOUT CONTRAST TECHNIQUE: Contiguous axial images were obtained from the base of the skull through the vertex without intravenous contrast. COMPARISON:  05/20/2019 FINDINGS: Brain: Left occipital high-density hemorrhage measures 31 x 21 mm, unchanged. Trace extension in the left occipital horn slightly improved. No new area of hemorrhage or mass. Ventricle size normal. No hydrocephalus. No acute ischemic infarct. Motion degraded study. Vascular: Negative for hyperdense vessel Skull: Negative Sinuses/Orbits: Paranasal sinuses clear. Bilateral cataract extraction. Other: None IMPRESSION: Stable hematoma left occipital lobe. Trace extension into the left occipital horn slightly improved. No hydrocephalus. No new hemorrhage Motion degraded study. Electronically Signed   By: Franchot Gallo M.D.   On: 05/21/2019 14:22   CT HEAD WO CONTRAST  Result Date: 05/20/2019 CLINICAL DATA:  86 year old female with acute left occipital hemorrhage yesterday. Sudden onset new nausea.  EXAM: CT HEAD WITHOUT CONTRAST TECHNIQUE: Contiguous axial images were obtained from the base of the skull through the vertex without intravenous contrast. COMPARISON:  Head CT 05/19/2019. CTA head and neck 05/18/2019 and earlier. FINDINGS: Brain: Oval mildly lobulated left occipital pole intra-axial hemorrhage encompasses 34 x 24 x 28 mm (AP by transverse by CC), slightly larger than yesterday. Estimated blood volume now 11 mL, increased about 2 mL since yesterday. Regional edema and mild regional mass effect are stable. There is a trace amount of extension now into the left occipital horn on series 4, image 15. But no other intraventricular blood identified. No subdural or subarachnoid hemorrhage identified. Stable ventricle size. Basilar cisterns remain patent. Stable gray-white matter differentiation elsewhere. Vascular: No suspicious intracranial vascular hyperdensity. Skull: No acute  osseous abnormality identified. Sinuses/Orbits: Visualized paranasal sinuses and mastoids are clear. Other: No acute orbit or scalp soft tissue findings. IMPRESSION: 1. Left occipital intra-axial hemorrhage has increased by 1-2 mL since yesterday, now about 3.4 cm (11 mL). But regional edema and mild regional mass effect are stable. 2. And there is trace new intraventricular extension into the left occipital horn. But ventricle size remains normal. Electronically Signed   By: Genevie Ann M.D.   On: 05/20/2019 16:52   CT Head Wo Contrast  Result Date: 05/19/2019 CLINICAL DATA:  Headache. Evaluated yesterday, interval worsening. EXAM: CT HEAD WITHOUT CONTRAST TECHNIQUE: Contiguous axial images were obtained from the base of the skull through the vertex without intravenous contrast. COMPARISON:  CT and CTA 05/18/2019 FINDINGS: Brain: Intraparenchymal hemorrhage in the left occipital lobe measures 2.9 x 2.5 x 2.3 cm (volume = 8.7 cm^3). Retrospectively this was present on exam yesterday and measured 2.3 x 1.6 x 2.1 cm at that time, slight interval increase on the current exam. There is mild surrounding edema. No midline shift. No evidence of subarachnoid or subdural hemorrhage. Brain volume is normal for age. Mild chronic small vessel ischemia. No hydrocephalus. Vascular: Atherosclerosis of skullbase vasculature without hyperdense vessel or abnormal calcification. Skull: No fracture or focal lesion. Sinuses/Orbits: Paranasal sinuses and mastoid air cells are clear. The visualized orbits are unremarkable. Bilateral cataract resection. Other: None. IMPRESSION: Left occipital intraparenchymal hemorrhage, 8.7cc volume, slightly increased in size from exam yesterday. No midline shift. Critical Value/emergent results were called by telephone at the time of interpretation on 05/19/2019 at 10:30 pm to Dr Martinique Hunter, who verbally acknowledged these results. Electronically Signed   By: Keith Rake M.D.   On: 05/19/2019  22:31   CT ANGIO NECK W OR WO CONTRAST  Result Date: 06/08/2019 CLINICAL DATA:  Left-sided weakness. EXAM: CT ANGIOGRAPHY HEAD AND NECK TECHNIQUE: Multidetector CT imaging of the head and neck was performed using the standard protocol during bolus administration of intravenous contrast. Multiplanar CT image reconstructions and MIPs were obtained to evaluate the vascular anatomy. Carotid stenosis measurements (when applicable) are obtained utilizing NASCET criteria, using the distal internal carotid diameter as the denominator. CONTRAST:  89mL OMNIPAQUE IOHEXOL 350 MG/ML SOLN COMPARISON:  CT angiogram FINDINGS: CT HEAD FINDINGS Reported separately. CTA NECK FINDINGS Aortic arch: Standard branching. Imaged portion shows no evidence of aneurysm or dissection. Calcified plaques are seen in the aortic arch and at the origin of the major arch vessel without stenosis. Right carotid system: Normal course and caliber of the right common carotid artery. Calcified atheromatous plaques are seen in the right carotid bifurcation without hemodynamically significant stenosis. Increased tortuosity of the cervical segment of the right ACA without stenosis. Left carotid  system: Normal course and caliber of the left common carotid artery. Calcified atherosclerotic plaque in the left carotid bifurcation without hemodynamically significant stenosis. Increased tortuosity of the cervical segment of the left ICA, without stenosis. Vertebral arteries: Codominant. No evidence of dissection, stenosis or occlusion. Skeleton: Degenerative changes of the cervical spine with multilevel neural foraminal narrowing. Degenerative changes of the sternoclavicular joints. No aggressive lesion identified. Other neck: Redemonstrated goiter and esophageal diverticulum. Upper chest: Negative Review of the MIP images confirms the above findings CTA HEAD FINDINGS Anterior circulation: Calcified plaques are seen in the bilateral carotid siphons, without  hemodynamically significant stenosis. Short segment of attenuation of the distal right M1/MCA at the level of the bifurcation with non opacification of the origin of the superior division branch (series 5, image 118) consistent with occlusion at the origin of the superior division branch. Comparison with prior CT angiogram also demonstrate missing superior division branches. The left MCA vascular tree in bilateral ACA vascular trees remain patent. Posterior circulation: Calcified plaque is seen at the left vertebral artery at the dural margin, without stenosis. The intracranial bilateral vertebral arteries and the basilar artery have normal course and caliber. Mild luminal irregularity throughout the bilateral PCAs, suggesting intracranial atherosclerotic disease. Venous sinuses: As permitted by contrast timing, patent. Anatomic variants: None Review of the MIP images confirms the above findings IMPRESSION: Short segment of attenuation of the distal right M1/MCA at the level of the bifurcation with non opacification of the origin of the superior division branch. Comparison with the prior CT angiogram also demonstrate missing superior division branches. I telephoned the critical test results to Dr. Erlinda Hong at 11:19 a.m. on 06/08/2019. Aortic Atherosclerosis (ICD10-I70.0). Electronically Signed   By: Pedro Earls M.D.   On: 06/08/2019 11:44   CT Angio Neck W and/or Wo Contrast  Result Date: 05/18/2019 CLINICAL DATA:  Subarachnoid hemorrhage suspected. New onset headache beginning last night the coming worse today. Confusion and blurred vision. EXAM: CT ANGIOGRAPHY HEAD AND NECK TECHNIQUE: Multidetector CT imaging of the head and neck was performed using the standard protocol during bolus administration of intravenous contrast. Multiplanar CT image reconstructions and MIPs were obtained to evaluate the vascular anatomy. Carotid stenosis measurements (when applicable) are obtained utilizing NASCET  criteria, using the distal internal carotid diameter as the denominator. CONTRAST:  17mL OMNIPAQUE IOHEXOL 350 MG/ML SOLN COMPARISON:  CT of the cervical spine 10/26/2018. CT head without contrast 03/27/18. CT angiogram of the head and neck 01/03/2017. FINDINGS: CT HEAD FINDINGS Brain: No acute infarct, hemorrhage, or mass lesion is present. Mild white matter changes are stable. The ventricles are of normal size. No significant extraaxial fluid collection is present. No subarachnoid hemorrhage is present. The brainstem and cerebellum are within normal limits. Vascular: Atherosclerotic changes are noted within the cavernous internal carotid arteries bilaterally. No hyperdense vessel is present. Skull: Calvarium is intact. No focal lytic or blastic lesions are present. No significant extracranial soft tissue lesion is present. Sinuses: The paranasal sinuses and mastoid air cells are clear. Bilateral lens replacements are noted. Orbits: Bilateral lens replacements are noted. Globes and orbits are otherwise unremarkable. Review of the MIP images confirms the above findings CTA NECK FINDINGS Aortic arch: A 3 vessel arch configuration is present. Atherosclerotic changes are noted at the origin of the left subclavian artery without significant stenosis in the more distal aorta without aneurysm. Right carotid system: The right common carotid artery is within normal limits. Atherosclerotic changes are noted at the bifurcation. Moderate tortuosity is  present without a significant stenosis relative to the more distal vessel. Left carotid system: The left common carotid artery is within normal limits. Atherosclerotic calcifications are present at the bifurcation without significant stenosis. Moderate tortuosity is present in the proximal left ICA without a significant stenosis relative to the more distal vessels. Vertebral arteries: The vertebral arteries are codominant. Both vertebral arteries originate from the subclavian  arteries without significant stenosis. There is no significant stenosis of either vertebral artery in the neck. Skeleton: Multilevel degenerative changes are again seen within the cervical spine. Grade 1 degenerative anterolisthesis at C3-4 and C4-5 stable. Chronic loss of endplate height is evident C5-6 and C6-7. Other neck: Prominent thyroid goiter is again noted. The thyroid is diffusely enlarged and heterogeneous. No dominant nodule is present. No followup recommended (ref: J Am Coll Radiol. 2015 Feb;12(2): 143-50). Upper chest: The lung apices are clear. Thoracic inlet is within normal limits. Review of the MIP images confirms the above findings CTA HEAD FINDINGS Anterior circulation: Atherosclerotic calcifications are present in the cavernous internal carotid arteries without significant stenosis relative to the ICA terminus. No aneurysm is present. The A1 and M1 segments are normal. The anterior communicating artery is patent. The ACA and MCA branch vessels are within normal limits. Posterior circulation: The vertebral arteries are codominant. PICA origins are visualized and normal. The basilar artery is normal. Both posterior cerebral arteries originate from basilar tip. A right posterior communicating artery is patent. The PCA branch vessels are within normal limits bilaterally. Venous sinuses: The dural sinuses are patent. The straight sinus deep cerebral veins are intact. Cortical veins are unremarkable. Anatomic variants: None Review of the MIP images confirms the above findings IMPRESSION: 1. No subarachnoid hemorrhage. Normal CT appearance of the noncontrast head. 2. Moderate tortuosity of the cervical internal carotid arteries bilaterally without significant stenosis relative to the more distal vessels. 3. Mild bilateral carotid bifurcation atherosclerotic changes without significant stenosis. 4. Additional atherosclerotic changes at the aortic arch and within the cavernous internal carotid arteries  without significant stenosis. 5. Multilevel spondylosis of the cervical spine. 6. Aortic Atherosclerosis (ICD10-I70.0). Electronically Signed   By: San Morelle M.D.   On: 05/18/2019 14:40   CT ABDOMEN PELVIS W CONTRAST  Result Date: 05/21/2019 CLINICAL DATA:  Acute abdominal pain. EXAM: CT ABDOMEN AND PELVIS WITH CONTRAST TECHNIQUE: Multidetector CT imaging of the abdomen and pelvis was performed using the standard protocol following bolus administration of intravenous contrast. CONTRAST:  140mL OMNIPAQUE IOHEXOL 300 MG/ML  SOLN COMPARISON:  10/16/2018. FINDINGS: Lower chest: Calcified granuloma in the right lower lobe. Atelectasis in the left lower lobe, adjacent to an elevated left hemidiaphragm. Heart is enlarged. Calcification of the aortic valve. No pericardial or pleural effusion. Distal esophagus is unremarkable. Hepatobiliary: Subcentimeter low-attenuation lesions in the liver are unchanged but too small to characterize. Cholecystectomy. No biliary ductal dilatation. Negative. Pancreas: Negative. Spleen: Negative. Adrenals/Urinary Tract: Adrenal glands are unremarkable. A 1.7 cm hyperdense lesion in the lower pole right kidney (3/39) may have enlarged slightly from 1.5 cm on 10/16/2026. A hyperdense lesion off the lower pole left kidney measures 3.5 cm (3/37), similar. A 2.4 cm exophytic low-attenuation lesion off the lower pole right kidney (3/38) is likely a cyst. Other low-attenuation lesions in the kidneys are too small to characterize. Ureters are decompressed. Bladder is grossly unremarkable although partially obscured by streak artifact from a right hip arthroplasty. Stomach/Bowel: Small hiatal hernia. Stomach is decompressed. Stomach, small bowel and colon are unremarkable. Appendix is not readily visualized.  Vascular/Lymphatic: Atherosclerotic calcification of the aorta without aneurysm. No pathologically enlarged lymph nodes. Reproductive: Hysterectomy.  No adnexal mass. Other: No free  fluid. An 11 mm right paramidline omental nodule (3/40) is unchanged. Mesenteries and peritoneum are otherwise unremarkable. Musculoskeletal: Right hip arthroplasty. Degenerative changes in the spine. No worrisome lytic or sclerotic lesions. IMPRESSION: 1. No acute findings to explain the patient's pain. 2. Hyperdense lesions in both kidneys cannot be characterized as simple cysts. Lesion on the right appears minimally larger. If further evaluation is desired, CT or MR without and with contrast could be performed. 3. Isolated and nonspecific right paramidline omental nodule, unchanged. 4.  Aortic atherosclerosis (ICD10-I70.0). Electronically Signed   By: Lorin Picket M.D.   On: 05/21/2019 14:26   ECHOCARDIOGRAM COMPLETE  Result Date: 05/20/2019    ECHOCARDIOGRAM REPORT   Patient Name:   INAARA HOLLIMAN Date of Exam: 05/20/2019 Medical Rec #:  [id number]         Height:       63.0 in Accession #:    [id number]        Weight:       150.0 lb Date of Birth:  May 25, 1925          BSA:          73.711 m Patient Age:    77 years          BP:           128/50 mmHg Patient Gender: F                 HR:           59 bpm. Exam Location:  [facility] Procedure: 2D Echo, Cardiac Doppler and Color Doppler Indications:    Stroke 434.91 / I163.9  History:        Patient has prior history of Echocardiogram examinations, most                 recent 07/19/2018. Pacemaker, Arrythmias:Atrial Fibrillation;                 Risk Factors:Hypertension. Chronic anticoagulation,GERD, Cancer.  Sonographer:    Alvino Chapel RCS Referring Phys: [place]  1. Left ventricular ejection fraction, by estimation, is 60 to 65%. The left ventricle has normal function. The left ventricle has no regional wall motion abnormalities. Left ventricular diastolic parameters are indeterminate in the setting of atrial fibrillation.  2. Right ventricular systolic function is normal. The right ventricular size is normal. There is moderately  elevated pulmonary artery systolic pressure. The estimated right ventricular systolic pressure is 123XX123 mmHg.  3. Left atrial size was severely dilated.  4. Right atrial size was moderately dilated.  5. The mitral valve is degenerative. Trivial mitral valve regurgitation.  6. The aortic valve is tricuspid. Aortic valve regurgitation is not visualized.  7. The inferior vena cava is dilated in size with >50% respiratory variability, suggesting right atrial pressure of 8 mmHg. FINDINGS  Left Ventricle: Left ventricular ejection fraction, by estimation, is 60 to 65%. The left ventricle has normal function. The left ventricle has no regional wall motion abnormalities. The left ventricular internal cavity size was normal in size. There is  borderline left ventricular hypertrophy. Left ventricular diastolic parameters are indeterminate. Right Ventricle: The right ventricular size is normal. No increase in right ventricular wall thickness. Right ventricular systolic function is normal. There is moderately elevated pulmonary artery systolic pressure. The tricuspid regurgitant velocity is 3.00 m/s, and with  an assumed right atrial pressure of 8 mmHg, the estimated right ventricular systolic pressure is 123XX123 mmHg. Left Atrium: Left atrial size was severely dilated. Right Atrium: Right atrial size was moderately dilated. Pericardium: There is no evidence of pericardial effusion. Mitral Valve: The mitral valve is degenerative in appearance. There is mild thickening of the mitral valve leaflet(s). Moderate mitral annular calcification. Trivial mitral valve regurgitation. Tricuspid Valve: The tricuspid valve is grossly normal. Tricuspid valve regurgitation is trivial. Aortic Valve: The aortic valve is tricuspid. Aortic valve regurgitation is not visualized. Mild aortic valve annular calcification. There is mild calcification of the aortic valve. Aortic valve mean gradient measures 10.0 mmHg. Aortic valve peak gradient  measures  17.8 mmHg. Aortic valve area, by VTI measures 1.96 cm. Pulmonic Valve: The pulmonic valve was grossly normal. Pulmonic valve regurgitation is trivial. Aorta: The aortic root is normal in size and structure. Venous: The inferior vena cava is dilated in size with greater than 50% respiratory variability, suggesting right atrial pressure of 8 mmHg. IAS/Shunts: No atrial level shunt detected by color flow Doppler. Additional Comments: A pacer wire is visualized.  LEFT VENTRICLE PLAX 2D LVIDd:         4.10 cm LVIDs:         2.50 cm LV PW:         0.90 cm LV IVS:        1.10 cm LVOT diam:     2.10 cm LV SV:         94.56 ml LV SV Index:   55.26 LVOT Area:     3.46 cm  LV Volumes (MOD) LV vol d, MOD A2C: 60.2 ml LV vol d, MOD A4C: 55.9 ml LV vol s, MOD A2C: 16.4 ml LV vol s, MOD A4C: 23.6 ml LV SV MOD A2C:     43.8 ml LV SV MOD A4C:     55.9 ml LV SV MOD BP:      38.5 ml RIGHT VENTRICLE TAPSE (M-mode): 1.9 cm LEFT ATRIUM              Index       RIGHT ATRIUM           Index LA diam:        4.00 cm  2.34 cm/m  RA Area:     25.10 cm LA Vol (A2C):   129.0 ml 75.39 ml/m RA Volume:   79.40 ml  46.40 ml/m LA Vol (A4C):   86.6 ml  50.61 ml/m LA Biplane Vol: 107.0 ml 62.53 ml/m  AORTIC VALVE AV Area (Vmax):    2.08 cm AV Area (Vmean):   2.03 cm AV Area (VTI):     1.96 cm AV Vmax:           211.00 cm/s AV Vmean:          146.000 cm/s AV VTI:            0.482 m AV Peak Grad:      17.8 mmHg AV Mean Grad:      10.0 mmHg LVOT Vmax:         127.00 cm/s LVOT Vmean:        85.500 cm/s LVOT VTI:          0.273 m LVOT/AV VTI ratio: 0.57  AORTA Ao Root diam: 3.10 cm MITRAL VALVE                TRICUSPID VALVE MV Area (PHT): 5.25 cm  TR Peak grad:   36.0 mmHg MV Decel Time: 145 msec     TR Vmax:        300.00 cm/s MV E velocity: 161.00 cm/s                             SHUNTS                             Systemic VTI:  0.27 m                             Systemic Diam: 2.10 cm Rozann Lesches MD Electronically signed by Rozann Lesches MD Signature Date/Time: 05/20/2019/2:54:19 PM    Final    DG HIP UNILAT WITH PELVIS 2-3 VIEWS RIGHT  Result Date: 05/20/2019 CLINICAL DATA:  Right hip pain after a fall. EXAM: DG HIP (WITH OR WITHOUT PELVIS) 2-3V RIGHT COMPARISON:  10/18/2018 FINDINGS: Previous total hip arthroplasty on the right. No complicating feature seen. No evidence of fracture. No other pelvic abnormal finding. IMPRESSION: Good appearance following previous right hip arthroplasty. No traumatic finding. Electronically Signed   By: Nelson Chimes M.D.   On: 05/20/2019 05:24   CT HEAD CODE STROKE WO CONTRAST  Result Date: 06/08/2019 CLINICAL DATA:  Code stroke. EXAM: CT HEAD WITHOUT CONTRAST TECHNIQUE: Contiguous axial images were obtained from the base of the skull through the vertex without intravenous contrast. COMPARISON:  Head CT obtained February 21st 2021. FINDINGS: Brain: New hypodensity involving the right insula, suggesting acute infarct. Redemonstrated subacute bleeding in the left occipital lobe measuring 2.5 x 1.6 cm (prior 3.1 x 2.0 cm) with surrounding vasogenic edema. Resolution of the extension of the bleed into the occipital horn of the left lateral ventricle. No new hemorrhage identified. Vascular: No hyperdense vessel or unexpected calcification. Skull: Normal. Negative for fracture or focal lesion. Sinuses/Orbits: No acute finding. Other: None. ASPECTS [facility] Stroke Program Early CT Score) - Ganglionic level infarction (caudate, lentiform nuclei, internal capsule, insula, M1-M3 cortex): 5 - Supraganglionic infarction (M4-M6 cortex): 3 Total score (0-10 with 10 being normal): 9 IMPRESSION: 1. New hypodensity involving the right insula, suggesting acute infarct. 2. Redemonstrated is subacute bleeding in the left occipital lobe with surrounding vasogenic edema. Resolution of the extension of the bleed into the occipital horn of the left lateral ventricle. No new hemorrhage. 3. ASPECTS is 9 These results were  called by telephone at the time of interpretation on 06/08/2019 at 11:00 am to provider Dr. Erlinda Hong, who verbally acknowledged these results. Electronically Signed   By: Pedro Earls M.D.   On: 06/08/2019 11:07   CT Angio Abd/Pel w/ and/or w/o  Result Date: 05/21/2019 CLINICAL DATA:  86 year old female with concern for mesenteric ischemia. EXAM: CTA ABDOMEN AND PELVIS WITHOUT AND WITH CONTRAST TECHNIQUE: Multidetector CT imaging of the abdomen and pelvis was performed using the standard protocol during bolus administration of intravenous contrast. Multiplanar reconstructed images and MIPs were obtained and reviewed to evaluate the vascular anatomy. CONTRAST:  135mL OMNIPAQUE IOHEXOL 350 MG/ML SOLN COMPARISON:  CT abdomen pelvis dated 05/21/2019. FINDINGS: VASCULAR Aorta: Advanced atherosclerotic disease.  No aneurysm or dissection. Celiac: Patent without evidence of aneurysm, dissection, vasculitis or significant stenosis. SMA: There is advanced atherosclerotic calcification of the origin of the SMA. There is a focal area of approximately 50% luminal narrowing of the proximal  SMA. The SMA remains patent. Renals: Atherosclerotic calcification of the origins of the renal arteries. The renal arteries are patent. IMA: Patent without evidence of aneurysm, dissection, vasculitis or significant stenosis. Inflow: Atherosclerotic calcification of the iliac arteries. The iliac arteries remain patent. Proximal Outflow: Bilateral common femoral and visualized portions of the superficial and profunda femoral arteries are patent without evidence of aneurysm, dissection, vasculitis or significant stenosis. Veins: No obvious venous abnormality within the limitations of this arterial phase study. Review of the MIP images confirms the above findings. NON-VASCULAR Lower chest: Diffuse interstitial coarsening. Left lung base atelectasis/scarring versus less likely pneumonia. Clinical correlation is recommended. There is  cardiomegaly with biatrial dilatation. Coronary vascular calcification and partially visualized pacemaker wire. No intra-abdominal free air or free fluid. Hepatobiliary: Mildly irregular liver contour, likely early changes of cirrhosis. No intrahepatic biliary ductal dilatation. Cholecystectomy. No retained calcified stone in the central CBD. Pancreas: Unremarkable. No pancreatic ductal dilatation or surrounding inflammatory changes. Spleen: Normal in size without focal abnormality. Adrenals/Urinary Tract: Mild adrenal thickening/hyperplasia. There is no hydronephrosis on either side. There is symmetric enhancement and excretion of contrast by both kidneys. Bilateral renal hypodense lesions are suboptimally characterized on this CT. These appears similar to the earlier CT. Ultrasound or may provide better characterization on a nonemergent basis. The visualized ureters appear unremarkable. Trabeculated appearance of the bladder wall, likely secondary to chronic bladder dysfunction. Stomach/Bowel: There is extensive sigmoid diverticulosis without active inflammatory changes. There is a small hiatal hernia. There is no bowel obstruction or active inflammation. Lymphatic: No adenopathy. Reproductive: Hysterectomy. Other: None Musculoskeletal: Right hip arthroplasty. No acute osseous pathology. Degenerative changes of the spine. IMPRESSION: 1. No acute intra-abdominal or pelvic pathology. 2. Sigmoid diverticulosis. No bowel obstruction or active inflammation. 3. No pneumatosis, portal venous gas, or free air. 4. Additional findings as above including Aortic Atherosclerosis (ICD10-I70.0). Electronically Signed   By: Anner Crete M.D.   On: 05/21/2019 23:12    Assessment: 86 y.o. female with PMH of hypertension, hyperlipidemia, pacer for bradycardia, afib currently not on AC and recent left occipital ICH 3 weeks ago presented from [facility] to ER for acute onset right gaze, left facial droop, slurred speech, right  hemiplegia. LSW 8:30 am. CT showed evolving left ICH stable with the surrounding cerebral edema. CT head and neck showed right superior branch of M2 decreased caliber with intermittent stenosis concerning for nonocclusive clot comparing with her CTA 3 weeks ago.  After CT, patient left arm and leg weakness much improved, however still has right gaze, left neglect and left facial droop with slurring speech. Discussed with son, given patient current situation, 87 year old, recent ICH, mRS = 3 post ICH, improving left-sided weakness, DNR status, patient may have high risk of recurrent ICH, hemorrhagic conversion, difficult extubation.  Son was in agreement not to pursue further intervention at this time.  Patient was given IV fluid and albumin for permissive hypertension. Recommend step down admission.  Stroke Risk Factors - atrial fibrillation, hyperlipidemia and hypertension  Plan: - recommend stepdown admission - HgbA1c, fasting lipid panel  - MRI not able to perform due to pacer - repeat CT head in am - received IV bolus and albumin for permissive hypertension, BP goal 130-180 for both ischemic and hemorrhagic stroke management.  - ASA 300 PR - depends on CT tomorrow, may consider heparin IV per stroke protocol - PT consult, OT consult, Speech consult - Echocardiogram  - NPO for now  - Risk factor modification - Telemetry monitoring - Frequent  neuro checks - will follow  - Son to contact [phone] Dominica Severin  Thank you for this consultation and allowing [place] to participate in the care of this patient.  Rosalin Hawking, MD PhD Stroke Neurology 06/08/2019 1:27 PM

## 2019-06-08 NOTE — ED Notes (Signed)
[name] [phone] Would like to be notified if needed.

## 2019-06-08 NOTE — H&P (Signed)
History and Physical    [name] [id number] DOB: 12/25/1925 DOA: 06/08/2019  PCP: Denise Pao, MD   Patient coming from: SNF  I have personally briefly reviewed patient's old medical records in [place]  Chief Complaint: Slurred speech and weakness  HPI: [name] is a 86 y.o. female with medical history significant of hypertension, hyperlipidemia, PPM, afib off anticoagulations and recent left occipital ICH 3 weeks ago presented from SNF to ER for new onset of slurred speech and right sided weakness. Pt was last see at her baseline about 8 AM this morning, then pt was found to have right sided gaze, left facial droop, slurred speech, left hemiplegia. CODE STROKE triggered in ED. Pt was mumbling, hard to understand her talking, but she denied any pain, no SOB, she pointed to her throat and saying she can hardly clear her throat. ED Course: CT showed evolving left ICH stable with the surrounding cerebral edema.  No other ICH but right insular cortex ischemic attenuation.  CT head and neck showed right superior branch of M2 decreased caliber with intermittent stenosis concerning for nonocclusive clot comparing with her CTA 3 weeks ago.  Review of Systems: Unable to perform due to pt mumbling and aphagia  Past Medical History:  Diagnosis Date  . Anxiety   . Atrial fibrillation ([place])   . Bradycardia   . Cancer ([place])    skin  . Cataract    removed bilat with lens both eyes   . Complication of anesthesia   . Difficulty sleeping   . Dysrhythmia    A FIB  . GERD (gastroesophageal reflux disease)   . H/O hiatal hernia   . History of kidney stones   . History of skin cancer   . History of transfusion   . Hyperlipidemia   . Hypertension   . Osteoarthritis   . Pacemaker   . Pneumonia   . PONV (postoperative nausea and vomiting)   . Shortness of breath    WITH EXERTION    Past Surgical History:  Procedure Laterality Date  . APPENDECTOMY  1960  .  ARTHROPLASTY  1994   Left total knee  . ARTHROPLASTY  2000   Total right knee  . BACK SURGERY    . CATARACT EXTRACTION, BILATERAL     with lens implants  . CHOLECYSTECTOMY    . COLONOSCOPY    . DG SELECTED HSG GDC ONLY  2002   Dilation  . Microdiskectomy  05/2004   Left L4-L5  . PACEMAKER INSERTION    . POLYPECTOMY    . TOTAL ABDOMINAL HYSTERECTOMY  1969  . TOTAL HIP ARTHROPLASTY Right 12/24/2013   Procedure: RIGHT TOTAL HIP ARTHROPLASTY ANTERIOR APPROACH;  Surgeon: Gearlean Alf, MD;  Location: [facility];  Service: Orthopedics;  Laterality: Right;  . UPPER GASTROINTESTINAL ENDOSCOPY       reports that she quit smoking about 45 years ago. She has never used smokeless tobacco. She reports that she does not drink alcohol or use drugs.  Allergies  Allergen Reactions  . Morphine And Related Nausea And Vomiting    Family History  Problem Relation Age of Onset  . Heart disease Mother   . Leukemia Father   . Colon cancer Neg Hx   . Esophageal cancer Neg Hx   . Stomach cancer Neg Hx   . Pancreatic cancer Neg Hx   . Liver disease Neg Hx   . Colon polyps Neg Hx  Prior to Admission medications   Medication Sig Start Date End Date Taking? Authorizing Provider  acetaminophen (TYLENOL) 325 MG tablet Take 650 mg by mouth every 6 (six) hours as needed for mild pain.     [provider]  amLODipine (NORVASC) 10 MG tablet Take 10 mg by mouth every morning.     [provider]  clonazePAM (KLONOPIN) 0.5 MG tablet Take 0.5 mg by mouth at bedtime as needed for anxiety.  03/18/15   [provider]  docusate sodium (COLACE) 100 MG capsule Take 100 mg by mouth daily as needed for mild constipation.    [provider]  feeding supplement, ENSURE ENLIVE, (ENSURE ENLIVE) LIQD Take 237 mLs by mouth 2 (two) times daily between meals. 08/11/18   Mercy Riding, MD  ferrous sulfate 325 (65 FE) MG tablet Take 325 mg by mouth 2 (two) times daily. 04/18/19   [provider]  furosemide (LASIX) 20 MG tablet Take 2 tablets (40 mg total) by mouth daily. 04/02/19 07/01/19  Evans Lance, MD  hydrALAZINE (APRESOLINE) 25 MG tablet Take 3 tablets (75 mg total) by mouth every 8 (eight) hours. 05/24/19   Donzetta Starch, NP  HYDROcodone-acetaminophen (NORCO) 5-325 MG tablet Take 0.5 tablets by mouth every 6 (six) hours as needed for moderate pain.     [provider]  irbesartan (AVAPRO) 300 MG tablet Take 300 mg by mouth every morning.    [provider]  methimazole (TAPAZOLE) 5 MG tablet Take 5 mg by mouth 3 (three) times daily.    [provider]  omeprazole (PRILOSEC) 20 MG capsule TAKE 1 CAPSULE (20 MG TOTAL) BY MOUTH DAILY. TAKE 30-60 MIN BEFORE DINNER Patient taking differently: Take 20 mg by mouth daily before supper. Take 30-60 min before dinner 02/19/19   Levin Erp, [place]  polyethylene glycol (MIRALAX / GLYCOLAX) 17 g packet Take 17 g by mouth daily as needed for mild constipation. 05/24/19   Donzetta Starch, NP  potassium chloride SA (K-DUR) 20 MEQ tablet Take 20 mEq by mouth 2 (two) times daily. 04/29/18   [provider]    Physical Exam: Vitals:   06/08/19 1215 06/08/19 1230 06/08/19 1245 06/08/19 1300  BP: (!) 165/75 (!) 170/73 (!) 175/106 (!) 179/80  Pulse: 66 74 67 70  Resp: (!) 25 16 20 19   Temp:      TempSrc:      SpO2: 100% 97% 98% 98%    Constitutional: NAD, calm, comfortable Vitals:   06/08/19 1215 06/08/19 1230 06/08/19 1245 06/08/19 1300  BP: (!) 165/75 (!) 170/73 (!) 175/106 (!) 179/80  Pulse: 66 74 67 70  Resp: (!) 25 16 20 19   Temp:      TempSrc:      SpO2: 100% 97% 98% 98%   Eyes: PERRL, lids and conjunctivae normal ENMT: Mucous membranes are dry.  Neck: normal, supple, no masses, no thyromegaly Respiratory: clear to auscultation bilaterally, no wheezing, no crackles. Normal respiratory effort. No accessory muscle use.  Cardiovascular: Regular rate and rhythm, no murmurs / rubs  / gallops. No extremity edema. 2+ pedal pulses. No carotid bruits.  Abdomen: no tenderness, no masses palpated. No hepatosplenomegaly. Bowel sounds positive.  Musculoskeletal: no clubbing / cyanosis. No joint deformity upper and lower extremities. Good ROM, no contractures. Normal muscle tone.  Skin: no rashes, lesions, ulcers. No induration Neurologic: Left neglect, left sided weakness, following command Psychiatric: Calm, not confused    Labs on Admission:  I have personally reviewed following labs and imaging studies  CBC: Recent Labs  Lab 06/08/19 1054 06/08/19 1058  WBC 9.0  --   NEUTROABS 7.1  --   HGB 12.3 13.3  HCT 39.0 39.0  MCV 86.5  --   PLT 427*  --    Basic Metabolic Panel: Recent Labs  Lab 06/08/19 1054 06/08/19 1058  NA 130* 128*  K 4.4 4.1  CL 96* 96*  CO2 22  --   GLUCOSE 103* 105*  BUN 9 9  CREATININE 0.90 0.90  CALCIUM 9.3  --    GFR: CrCl cannot be calculated (Unknown ideal weight.). Liver Function Tests: Recent Labs  Lab 06/08/19 1054  AST 19  ALT 15  ALKPHOS 94  BILITOT 0.5  PROT 6.2*  ALBUMIN 3.3*   No results for input(s): LIPASE, AMYLASE in the last 168 hours. No results for input(s): AMMONIA in the last 168 hours. Coagulation Profile: Recent Labs  Lab 06/08/19 1054  INR 1.1   Cardiac Enzymes: No results for input(s): CKTOTAL, CKMB, CKMBINDEX, TROPONINI in the last 168 hours. BNP (last 3 results) No results for input(s): PROBNP in the last 8760 hours. HbA1C: No results for input(s): HGBA1C in the last 72 hours. CBG: Recent Labs  Lab 06/08/19 1049  GLUCAP 106*   Lipid Profile: No results for input(s): CHOL, HDL, LDLCALC, TRIG, CHOLHDL, LDLDIRECT in the last 72 hours. Thyroid Function Tests: No results for input(s): TSH, T4TOTAL, FREET4, T3FREE, THYROIDAB in the last 72 hours. Anemia Panel: No results for input(s): VITAMINB12, FOLATE, FERRITIN, TIBC, IRON, RETICCTPCT in the last 72 hours. Urine analysis:    Component  Value Date/Time   COLORURINE STRAW (A) 05/22/2019 0140   APPEARANCEUR CLEAR 05/22/2019 0140   LABSPEC 1.042 (H) 05/22/2019 0140   PHURINE 7.0 05/22/2019 0140   GLUCOSEU NEGATIVE 05/22/2019 0140   HGBUR NEGATIVE 05/22/2019 0140   BILIRUBINUR NEGATIVE 05/22/2019 0140   KETONESUR NEGATIVE 05/22/2019 0140   PROTEINUR NEGATIVE 05/22/2019 0140   UROBILINOGEN 0.2 11/12/2014 1331   NITRITE NEGATIVE 05/22/2019 0140   LEUKOCYTESUR NEGATIVE 05/22/2019 0140    Radiological Exams on Admission: CT ANGIO HEAD W OR WO CONTRAST  Result Date: 06/08/2019 CLINICAL DATA:  Left-sided weakness. EXAM: CT ANGIOGRAPHY HEAD AND NECK TECHNIQUE: Multidetector CT imaging of the head and neck was performed using the standard protocol during bolus administration of intravenous contrast. Multiplanar CT image reconstructions and MIPs were obtained to evaluate the vascular anatomy. Carotid stenosis measurements (when applicable) are obtained utilizing NASCET criteria, using the distal internal carotid diameter as the denominator. CONTRAST:  75mL OMNIPAQUE IOHEXOL 350 MG/ML SOLN COMPARISON:  CT angiogram FINDINGS: CT HEAD FINDINGS Reported separately. CTA NECK FINDINGS Aortic arch: Standard branching. Imaged portion shows no evidence of aneurysm or dissection. Calcified plaques are seen in the aortic arch and at the origin of the major arch vessel without stenosis. Right carotid system: Normal course and caliber of the right common carotid artery. Calcified atheromatous plaques are seen in the right carotid bifurcation without hemodynamically significant stenosis. Increased tortuosity of the cervical segment of the right ACA without stenosis. Left carotid system: Normal course and caliber of the left common carotid artery. Calcified atherosclerotic plaque in the left carotid bifurcation without hemodynamically significant stenosis. Increased tortuosity of the cervical segment of the left ICA, without stenosis. Vertebral arteries:  Codominant. No evidence of dissection, stenosis or occlusion. Skeleton: Degenerative changes of the cervical spine with multilevel neural foraminal narrowing. Degenerative changes of the sternoclavicular joints. No  aggressive lesion identified. Other neck: Redemonstrated goiter and esophageal diverticulum. Upper chest: Negative Review of the MIP images confirms the above findings CTA HEAD FINDINGS Anterior circulation: Calcified plaques are seen in the bilateral carotid siphons, without hemodynamically significant stenosis. Short segment of attenuation of the distal right M1/MCA at the level of the bifurcation with non opacification of the origin of the superior division branch (series 5, image 118) consistent with occlusion at the origin of the superior division branch. Comparison with prior CT angiogram also demonstrate missing superior division branches. The left MCA vascular tree in bilateral ACA vascular trees remain patent. Posterior circulation: Calcified plaque is seen at the left vertebral artery at the dural margin, without stenosis. The intracranial bilateral vertebral arteries and the basilar artery have normal course and caliber. Mild luminal irregularity throughout the bilateral PCAs, suggesting intracranial atherosclerotic disease. Venous sinuses: As permitted by contrast timing, patent. Anatomic variants: None Review of the MIP images confirms the above findings IMPRESSION: Short segment of attenuation of the distal right M1/MCA at the level of the bifurcation with non opacification of the origin of the superior division branch. Comparison with the prior CT angiogram also demonstrate missing superior division branches. I telephoned the critical test results to Dr. Erlinda Hong at 11:19 a.m. on 06/08/2019. Aortic Atherosclerosis (ICD10-I70.0). Electronically Signed   By: Pedro Earls M.D.   On: 06/08/2019 11:44   CT ANGIO NECK W OR WO CONTRAST  Result Date: 06/08/2019 CLINICAL DATA:   Left-sided weakness. EXAM: CT ANGIOGRAPHY HEAD AND NECK TECHNIQUE: Multidetector CT imaging of the head and neck was performed using the standard protocol during bolus administration of intravenous contrast. Multiplanar CT image reconstructions and MIPs were obtained to evaluate the vascular anatomy. Carotid stenosis measurements (when applicable) are obtained utilizing NASCET criteria, using the distal internal carotid diameter as the denominator. CONTRAST:  85mL OMNIPAQUE IOHEXOL 350 MG/ML SOLN COMPARISON:  CT angiogram FINDINGS: CT HEAD FINDINGS Reported separately. CTA NECK FINDINGS Aortic arch: Standard branching. Imaged portion shows no evidence of aneurysm or dissection. Calcified plaques are seen in the aortic arch and at the origin of the major arch vessel without stenosis. Right carotid system: Normal course and caliber of the right common carotid artery. Calcified atheromatous plaques are seen in the right carotid bifurcation without hemodynamically significant stenosis. Increased tortuosity of the cervical segment of the right ACA without stenosis. Left carotid system: Normal course and caliber of the left common carotid artery. Calcified atherosclerotic plaque in the left carotid bifurcation without hemodynamically significant stenosis. Increased tortuosity of the cervical segment of the left ICA, without stenosis. Vertebral arteries: Codominant. No evidence of dissection, stenosis or occlusion. Skeleton: Degenerative changes of the cervical spine with multilevel neural foraminal narrowing. Degenerative changes of the sternoclavicular joints. No aggressive lesion identified. Other neck: Redemonstrated goiter and esophageal diverticulum. Upper chest: Negative Review of the MIP images confirms the above findings CTA HEAD FINDINGS Anterior circulation: Calcified plaques are seen in the bilateral carotid siphons, without hemodynamically significant stenosis. Short segment of attenuation of the distal right  M1/MCA at the level of the bifurcation with non opacification of the origin of the superior division branch (series 5, image 118) consistent with occlusion at the origin of the superior division branch. Comparison with prior CT angiogram also demonstrate missing superior division branches. The left MCA vascular tree in bilateral ACA vascular trees remain patent. Posterior circulation: Calcified plaque is seen at the left vertebral artery at the dural margin, without stenosis. The intracranial bilateral vertebral  arteries and the basilar artery have normal course and caliber. Mild luminal irregularity throughout the bilateral PCAs, suggesting intracranial atherosclerotic disease. Venous sinuses: As permitted by contrast timing, patent. Anatomic variants: None Review of the MIP images confirms the above findings IMPRESSION: Short segment of attenuation of the distal right M1/MCA at the level of the bifurcation with non opacification of the origin of the superior division branch. Comparison with the prior CT angiogram also demonstrate missing superior division branches. I telephoned the critical test results to Dr. Erlinda Hong at 11:19 a.m. on 06/08/2019. Aortic Atherosclerosis (ICD10-I70.0). Electronically Signed   By: Pedro Earls M.D.   On: 06/08/2019 11:44   CT HEAD CODE STROKE WO CONTRAST  Result Date: 06/08/2019 CLINICAL DATA:  Code stroke. EXAM: CT HEAD WITHOUT CONTRAST TECHNIQUE: Contiguous axial images were obtained from the base of the skull through the vertex without intravenous contrast. COMPARISON:  Head CT obtained February 21st 2021. FINDINGS: Brain: New hypodensity involving the right insula, suggesting acute infarct. Redemonstrated subacute bleeding in the left occipital lobe measuring 2.5 x 1.6 cm (prior 3.1 x 2.0 cm) with surrounding vasogenic edema. Resolution of the extension of the bleed into the occipital horn of the left lateral ventricle. No new hemorrhage identified. Vascular: No  hyperdense vessel or unexpected calcification. Skull: Normal. Negative for fracture or focal lesion. Sinuses/Orbits: No acute finding. Other: None. ASPECTS [facility] Stroke Program Early CT Score) - Ganglionic level infarction (caudate, lentiform nuclei, internal capsule, insula, M1-M3 cortex): 5 - Supraganglionic infarction (M4-M6 cortex): 3 Total score (0-10 with 10 being normal): 9 IMPRESSION: 1. New hypodensity involving the right insula, suggesting acute infarct. 2. Redemonstrated is subacute bleeding in the left occipital lobe with surrounding vasogenic edema. Resolution of the extension of the bleed into the occipital horn of the left lateral ventricle. No new hemorrhage. 3. ASPECTS is 9 These results were called by telephone at the time of interpretation on 06/08/2019 at 11:00 am to provider Dr. Erlinda Hong, who verbally acknowledged these results. Electronically Signed   By: Pedro Earls M.D.   On: 06/08/2019 11:07    EKG: Independently reviewed. A-fib, RBBB  Assessment/Plan Active Problems:   Cerebral embolism with cerebral infarction   CVA (cerebral vascular accident) ([place])  Ischemic Stroke CT head and neck showed right superior branch of M2 decreased caliber with intermittent stenosis concerning for nonocclusive clot comparing with her CTA 3 weeks ago On ASA Hold all PO meds for high risk of aspiration Speech evaluation D/W neuro, recommend SBP 140-180 range, NPO and IVF Neuro propose a diagnostic angiogram Prognosis poor given repeated CVA and severeness this time, son Dominica Severin made aware. Close monitoring in PCU for now.  HTN Hold all PO BP meds PRN Hydralazine  Hyperthyroid On Methimazole  Chronic A-fib No A/C due to recent [place]   DVT prophylaxis: Heparin SubQ Code Status: DNR Family Communication: Son Dominica Severin, all questions answered at my best knowledge Disposition Plan: PCU Consults called: Neurology Admission status: PCU   Lequita Halt MD [facility] Pager [phone]    06/08/2019, 1:30 PM

## 2019-06-09 ENCOUNTER — Inpatient Hospital Stay (HOSPITAL_COMMUNITY): Payer: Medicare Other

## 2019-06-09 LAB — BASIC METABOLIC PANEL
Anion gap: 13 (ref 5–15)
BUN: 7 mg/dL — ABNORMAL LOW (ref 8–23)
CO2: 20 mmol/L — ABNORMAL LOW (ref 22–32)
Calcium: 9.1 mg/dL (ref 8.9–10.3)
Chloride: 99 mmol/L (ref 98–111)
Creatinine, Ser: 0.81 mg/dL (ref 0.44–1.00)
GFR calc Af Amer: >60 mL/min (ref >60)
GFR calc non Af Amer: >60 mL/min (ref >60)
Glucose, Bld: 94 mg/dL (ref 70–99)
Potassium: 4.5 mmol/L (ref 3.5–5.1)
Sodium: 132 mmol/L — ABNORMAL LOW (ref 135–145)

## 2019-06-09 LAB — HEMOGLOBIN A1C
Hgb A1c MFr Bld: 5.4 % (ref 4.8–5.6)
Mean Plasma Glucose: 108.28 mg/dL

## 2019-06-09 LAB — CBC
HCT: 35.8 % — ABNORMAL LOW (ref 36.0–46.0)
Hemoglobin: 11.6 g/dL — ABNORMAL LOW (ref 12.0–15.0)
MCH: 26.8 pg (ref 26.0–34.0)
MCHC: 32.4 g/dL (ref 30.0–36.0)
MCV: 82.7 fL (ref 80.0–100.0)
Platelets: 242 10*3/uL (ref 150–400)
RBC: 4.33 MIL/uL (ref 3.87–5.11)
RDW: 21.9 % — ABNORMAL HIGH (ref 11.5–15.5)
WBC: 8.1 10*3/uL (ref 4.0–10.5)
nRBC: 0 % (ref 0.0–0.2)

## 2019-06-09 LAB — LIPID PANEL
Cholesterol: 201 mg/dL — ABNORMAL HIGH (ref 0–200)
HDL: 52 mg/dL (ref >40)
LDL Cholesterol: 133 mg/dL — ABNORMAL HIGH (ref 0–99)
Total CHOL/HDL Ratio: 3.9 RATIO
Triglycerides: 79 mg/dL (ref <150)
VLDL: 16 mg/dL (ref 0–40)

## 2019-06-09 NOTE — Evaluation (Signed)
Physical Therapy Evaluation Patient Details Name: [name] MRN: [id number] DOB: 01-13-1926 Today's Date: 06/09/2019   History of Present Illness  86 y.o. female with PMH of hypertension, hyperlipidemia, pacer for bradycardia, afib currently not on AC and recent left occipital ICH 3 weeks ago presented from SNF to ER for acute onset right gaze, left facial droop, slurred speech, right hemiplegia. LSW 8:30 am. CT showed evolving left ICH stable with the surrounding cerebral edema. CT head and neck showed right superior branch of M2 decreased caliber with intermittent stenosis concerning for nonocclusive clot comparing with her CTA 3 weeks ago.    Clinical Impression  Pt cooperative with PT /OT eval. She did have right sided weakness now left sided weakness.  Left leg with functional strength with decreased awareness of left leg.  Pt needed +2 help to sit EOB and stand with RW and then pivot transfer to chair.  She was able to bear weight through left leg but fatiqued quickly.  Pt delayed with following commands - hard to tell how much is motor planning delay versus HOH today.  Pts son present for eval.  He is agreeable to pt going back to [facility] for more therapy -she will need +2 skilled therapy at SNF level at DC to help her regain functional mobility.    Follow Up Recommendations SNF;Supervision/Assistance - 24 hour    Equipment Recommendations  None recommended by PT    Recommendations for Other Services       Precautions / Restrictions Precautions Precautions: Fall Precaution Comments: Right side weakness - 3 weeks ago.  Now left side weakness Restrictions Weight Bearing Restrictions: No Other Position/Activity Restrictions: Pt denied dizziness throughout session.  pt easily fatiqued      Mobility  Bed Mobility Overal bed mobility: Needs Assistance Bed Mobility: Supine to Sit Rolling: Mod assist   Supine to sit: Mod assist;HOB elevated     General bed mobility  comments: pt didnt bring left leg over.  Pt unable to scoot left side forward without assist.  Pt needed max cues for sequencing  Transfers Overall transfer level: Needs assistance Equipment used: Rolling walker (2 wheeled) Transfers: Sit to/from [facility] Sit to Stand: Min assist;From elevated surface;+2 physical assistance Stand pivot transfers: Mod assist;+2 physical assistance;From elevated surface       General transfer comment: Pt unable to follow instructions for techque. she stood from high bed with RW but unable to safely turn with RW to sit in chair.  Pt sat back down and did stand pivot transfer.  pt does well with stand but hard time turning to sit - unsure if didnt hear directions or more limited by motor planning  Ambulation/Gait Ambulation/Gait assistance: Mod assist;+2 physical assistance   Assistive device: Rolling walker (2 wheeled)       General Gait Details: Pt stood with RW and marched in place 8 steps - pt able to bear weight through left leg.  She needed max assist to hold left UE on RW.  pt did 5 mini squats with RW.  then she started leaning to left and unable to turn to chiar.  She had to sit down and then stand pivot to chiar with mod assist x 2 to right side  Stairs            Wheelchair Mobility    Modified Rankin (Stroke Patients Only) Modified Rankin (Stroke Patients Only) Pre-Morbid Rankin Score: Moderately severe disability Modified Rankin: Severe disability  Balance Overall balance assessment: Needs assistance Sitting-balance support: Feet supported Sitting balance-Leahy Scale: Poor Sitting balance - Comments: Pt not aware of left UE.  Pt tended to lean posterior - she needed cues to regain posture - delayed and overall needed min assist for sitting EOB Postural control: Posterior lean Standing balance support: Bilateral upper extremity supported Standing balance-Leahy Scale: Poor Standing balance comment: reliant on  RW for balance - tired quickly (after 60 seconds) started leaning to L - even with RW support                             Pertinent Vitals/Pain Pain Assessment: No/denies pain(no pain now but reports had right sided HA earlier) Faces Pain Scale: No hurt Pain Intervention(s): Monitored during session    Home Living Family/patient expects to be discharged to:: Skilled nursing facility                 Additional Comments: Pt was living independently 3 weeks ago and had ICH with right sided weakness - to [facility] for rehab.  on day of DC home with family - new onset LEFT weakness    Prior Function     Gait / Transfers Assistance Needed: uses a cane for gait both inside the home and for community access - 3 weeks ago           Hand Dominance        Extremity/Trunk Assessment   Upper Extremity Assessment Upper Extremity Assessment: Defer to OT evaluation    Lower Extremity Assessment Lower Extremity Assessment: LLE deficits/detail LLE Deficits / Details: left leg grossly 3/5 hip flexion, knee extension and DF LLE Sensation: decreased light touch    Cervical / Trunk Assessment Cervical / Trunk Assessment: Normal  Communication   Communication: HOH  Cognition Arousal/Alertness: Lethargic Behavior During Therapy: Flat affect;Impulsive Overall Cognitive Status: Impaired/Different from baseline                           Safety/Judgement: Decreased awareness of deficits;Decreased awareness of safety   Problem Solving: Difficulty sequencing;Requires verbal cues General Comments: slowed mentation and inconsistently followed commands with delay.  Unsure at this time what was mental delay versus HOH      General Comments General comments (skin integrity, edema, etc.): Pt son was present for session - able to observe PT/OT eval and educated on right gaze and helping increase attention to left    Exercises Other Exercises Other Exercises:  walking in place 8 steps with RW Other Exercises: mini knee bends wtih RW 5x - cues for standing straight each time.   Assessment/Plan    PT Assessment Patient needs continued PT services  PT Problem List Decreased strength;Decreased mobility;Decreased cognition;Decreased balance;Decreased knowledge of use of DME;Decreased knowledge of precautions;Decreased safety awareness;Impaired tone;Decreased activity tolerance;Cardiopulmonary status limiting activity;Impaired sensation       PT Treatment Interventions DME instruction;Balance training;Therapeutic activities;Gait training;Functional mobility training;Therapeutic exercise;Patient/family education;Cognitive remediation;Neuromuscular re-education    PT Goals (Current goals can be found in the Care Plan section)  Acute Rehab PT Goals Patient Stated Goal: to get better PT Goal Formulation: With patient/family Time For Goal Achievement: 06/23/19 Potential to Achieve Goals: Fair    Frequency Min 3X/week   Barriers to discharge Decreased caregiver support Pt lives alone - family stops by to check on her daily    Co-evaluation PT/OT/SLP Co-Evaluation/Treatment: Yes Reason for Co-Treatment: Complexity of the  patient's impairments (multi-system involvement);Necessary to address cognition/behavior during functional activity;For patient/therapist safety;To address functional/ADL transfers PT goals addressed during session: Mobility/safety with mobility;Balance;Proper use of DME;Strengthening/ROM         AM-PAC PT "6 Clicks" Mobility  Outcome Measure Help needed turning from your back to your side while in a flat bed without using bedrails?: A Lot Help needed moving from lying on your back to sitting on the side of a flat bed without using bedrails?: A Lot Help needed moving to and from a bed to a chair (including a wheelchair)?: A Lot Help needed standing up from a chair using your arms (e.g., wheelchair or bedside chair)?: A Lot Help  needed to walk in hospital room?: Total Help needed climbing 3-5 steps with a railing? : Total 6 Click Score: 10    End of Session Equipment Utilized During Treatment: Gait belt Activity Tolerance: Patient limited by fatigue Patient left: in chair;with chair alarm set;with family/visitor present;with call bell/phone within reach Nurse Communication: Mobility status PT Visit Diagnosis: Other abnormalities of gait and mobility (R26.89);History of falling (Z91.81);Muscle weakness (generalized) (M62.81);Difficulty in walking, not elsewhere classified (R26.2);Hemiplegia and hemiparesis Hemiplegia - Right/Left: Left Hemiplegia - caused by: Cerebral infarction    Time: 1140-1210 PT Time Calculation (min) (ACUTE ONLY): 30 min   Charges:   PT Evaluation $PT Eval Moderate Complexity: 1 Mod         06/09/2019   Rande Lawman, PT   Loyal Buba 06/09/2019, 12:57 PM

## 2019-06-09 NOTE — Progress Notes (Signed)
STROKE TEAM PROGRESS NOTE   HISTORY OF PRESENT ILLNESS (per record) [name] is a 86 y.o. Caucasian female with PMH of hypertension, hyperlipidemia, pacer for bradycardia, afib currently not on [facility] and recent left occipital ICH 3 weeks ago presented from SNF to ER for code stroke. As per EMS, pt was about to be discharged from SNF today, last seen baseline about 8:30 AM.  Shortly after, nursing SNF found she had right gaze, left facial droop, slurred speech, right hemiplegia.  EMS was called, patient sent to ER for evaluation.  Patient was admitted 3 weeks ago for left occipital ICH in the setting of Lovenox for A. fib treatment.  She was reversed with protamine, treated with BP management and PT/OT.  She was sent to SNF on discharge.  As per son, patient was able to walk with walker but needed 1 person assist, she was not independent yet.  However, before it ICH, patient was independent at home. CT showed evolving left ICH stable with the surrounding cerebral edema.  No other ICH but right insular cortex ischemic attenuation.  CT head and neck showed right superior branch of M2 decreased caliber with intermittent stenosis concerning for nonocclusive clot comparing with her CTA 3 weeks ago.  After CT, patient left arm and leg weakness much improved, however still has right gaze, left neglect and left facial droop with slurring speech. Discussed with son, given patient current situation, 72 year old, recent ICH, mRS = 3 post ICH, improving left-sided weakness, DNR status, patient may have high risk of recurrent ICH, hemorrhagic conversion, difficult extubation.  Son was in agreement not to pursue further intervention at this time.  Patient was given IV fluid and albumin for permissive hypertension. LSN: 8:30 AM tPA Given: No: Recent ICH   INTERVAL HISTORY Her nurse is at bedside, no events overnight, CT this morning stable    OBJECTIVE Vitals:   06/09/19 0105 06/09/19 0305 06/09/19 0517  06/09/19 0519  BP: (!) 164/77 (!) 130/55 (!) 203/97 (!) 162/80  Pulse: 60 60 74 72  Resp: 16 15 16 19   Temp:  98.2 F (36.8 C)    TempSrc:  Oral    SpO2: 97% 96% 94% 93%    CBC:  Recent Labs  Lab 06/08/19 1054 06/08/19 1054 06/08/19 1058 06/09/19 0444  WBC 9.0  --   --  8.1  NEUTROABS 7.1  --   --   --   HGB 12.3   < > 13.3 11.6*  HCT 39.0   < > 39.0 35.8*  MCV 86.5  --   --  82.7  PLT 427*  --   --  242   < > = values in this interval not displayed.    Basic Metabolic Panel:  Recent Labs  Lab 06/08/19 1054 06/08/19 1054 06/08/19 1058 06/09/19 0444  NA 130*   < > 128* 132*  K 4.4   < > 4.1 4.5  CL 96*   < > 96* 99  CO2 22  --   --  20*  GLUCOSE 103*   < > 105* 94  BUN 9   < > 9 7*  CREATININE 0.90   < > 0.90 0.81  CALCIUM 9.3  --   --  9.1   < > = values in this interval not displayed.    Lipid Panel:     Component Value Date/Time   CHOL 201 (H) 06/09/2019 0444   TRIG 79 06/09/2019 0444   HDL 52 06/09/2019  0444   CHOLHDL 3.9 06/09/2019 0444   VLDL 16 06/09/2019 0444   LDLCALC 133 (H) 06/09/2019 0444   HgbA1c:  Lab Results  Component Value Date   HGBA1C 5.4 06/09/2019   Urine Drug Screen:     Component Value Date/Time   LABOPIA NONE DETECTED 06/08/2019 1932   COCAINSCRNUR NONE DETECTED 06/08/2019 1932   LABBENZ NONE DETECTED 06/08/2019 1932   AMPHETMU NONE DETECTED 06/08/2019 1932   THCU NONE DETECTED 06/08/2019 1932   LABBARB NONE DETECTED 06/08/2019 1932    Alcohol Level     Component Value Date/Time   ETH <10 06/08/2019 1054    IMAGING  CT ANGIO HEAD W OR WO CONTRAST CT ANGIO NECK W OR WO CONTRAST 06/08/2019 IMPRESSION:  Short segment of attenuation of the distal right M1/MCA at the level of the bifurcation with non opacification of the origin of the superior division branch. Comparison with the prior CT angiogram also demonstrate missing superior division branches. Aortic Atherosclerosis (ICD10-I70.0).   CT HEAD WO  CONTRAST 06/09/2019 IMPRESSION:  1. Evolving acute right MCA territory infarct. No associated hemorrhage or significant mass effect.  2. No significant interval change in size of subacute left occipital hemorrhage with similar surrounding edema.  3. No other new acute intracranial abnormality.  DG CHEST PORT 1 VIEW 06/08/2019 IMPRESSION:  No acute abnormality noted.   CT HEAD CODE STROKE WO CONTRAST 06/08/2019 IMPRESSION:  1. New hypodensity involving the right insula, suggesting acute infarct.  2. Redemonstrated is subacute bleeding in the left occipital lobe with surrounding vasogenic edema. Resolution of the extension of the bleed into the occipital horn of the left lateral ventricle. No new hemorrhage.  3. ASPECTS is 9   Transthoracic Echocardiogram  05/20/2019 IMPRESSIONS  1. Left ventricular ejection fraction, by estimation, is 60 to 65%. The  left ventricle has normal function. The left ventricle has no regional  wall motion abnormalities. Left ventricular diastolic parameters are  indeterminate in the setting of atrial fibrillation.  2. Right ventricular systolic function is normal. The right ventricular  size is normal. There is moderately elevated pulmonary artery systolic  pressure. The estimated right ventricular systolic pressure is [id number] mmHg.  3. Left atrial size was severely dilated.  4. Right atrial size was moderately dilated.  5. The mitral valve is degenerative. Trivial mitral valve regurgitation.  6. The aortic valve is tricuspid. Aortic valve regurgitation is not  visualized.  7. The inferior vena cava is dilated in size with >50% respiratory  variability, suggesting right atrial pressure of 8 mmHg.    ECG - atrial fibrillation - ventricular response 66 BPM (See cardiology reading for complete details)   PHYSICAL EXAM Blood pressure (!) 162/80, pulse 72, temperature 98.2 F (36.8 C), temperature source Oral, resp. rate 19, SpO2 93 %.   Elderly  white female, in bed sitting up, lethargic/sleepy but does respond to voice and open eyes, dysarthric speech, oriented to person, location, month, follows simple commands such as lift right arm or left leg, right gaze preference does not cross the midline, blinks to threat only on the left, left lower facial weakness, tongue midline without fasciculations, hearing intact to voice, shoulder shrug intact, she has left-sided weakness but can lift lift arm and left leg anti-gravity without drift, toes equiv.    ASSESSMENT/PLAN Ms. YUXI PANETTI is a 86 y.o. female with history of hypertension, hyperlipidemia, pacer for bradycardia, afib currently not on [facility] and recent left occipital ICH 3 weeks ago presenting with right  gaze, left facial droop, slurred speech, and right hemiplegia.    She did not receive IV t-PA due to recent ICH.  Stroke:  Rt MCA infarct - embolic - likely due to atrial fibrillation not on anticoagulation due to [place]  Code Stroke CT Head - New hypodensity involving the right insula, suggesting acute infarct. Redemonstrated is subacute bleeding in the left occipital lobe with surrounding vasogenic edema. Resolution of the extension of the bleed into the occipital horn of the left lateral ventricle. No new hemorrhage. ASPECTS is 9   CT head - Evolving acute right MCA territory infarct. No associated hemorrhage or significant mass effect. No significant interval change in size of subacute left occipital hemorrhage with similar surrounding edema.   MRI head - not ordered - pacemaker  MRA head - not ordered  CTA H&N - Short segment of attenuation of the distal right M1/MCA at the level of the bifurcation with non opacification of the origin of the superior division branch. Comparison with the prior CT angiogram also demonstrate missing superior division branches.   CT Perfusion - not ordered  Carotid Doppler - CTA neck performed - carotid dopplers not indicated.  2D Echo - 05/20/19 -  EF 60 - 65%. Atrial fibrillation  [employer] Virus 2 - negative  LDL - 133  HgbA1c - 5.4  UDS - negative  VTE prophylaxis -  Heparin Diet  Diet Order            Diet NPO time specified  Diet effective now              No antithrombotic prior to admission, now on aspirin 300 mg suppository daily. Given size of the right MCA hypodensity we will not start IV Helparin at this time, will repeat CT head Monday morning and consider IV heparin as a bridge to anticoagulation at that time. Discussed with Dr. Rosalin Hawking today.  Patient will be counseled to be compliant with her antithrombotic medications  Ongoing aggressive stroke risk factor management  Therapy recommendations:  pending  Disposition:  Pending  Hypertension  Home BP meds: Norvasc ; Apresoline ; Avapro  Current BP meds: Apresoline prn  Occasionally high SBP . Need to avoid extreme elevations of BP with recent ICH - SBP goal 130 - 180 per Dr Erlinda Hong . Long-term BP goal normotensive  Hyperlipidemia  Home Lipid lowering medication: none   LDL 133, goal < 70  Current lipid lowering medication: none / NPO   Continue statin at discharge  Other Stroke Risk Factors  Advanced age  Former cigarette smoker - quit  Hx stroke/TIA  Atrial fibrillation  Other Active Problems  Code status - DNR  Aortic Atherosclerosis (ICD10-I70.0)  Mild anemia - Hb 11.6  Hyponatremia - 130->128->132  Hospital day # 1  Unfortunate 86 year old female with embolic strokes due to afib. She was not anticoagulated due to recent ICH. She is on aspirin. Unfortunately her stroke risk is very elevated due to afib. Given size of the right MCA hypodensity we will not start IV Helparin at this time, will repeat CT head Monday morning and consider IV heparin as a bridge to anticoagulation at that time. Discussed with Dr. Rosalin Hawking today. We will continue to follow with you.    Personally examined patient and images, and have  participated in and made any corrections needed to history, physical, neuro exam,assessment and plan as stated above.  I have personally obtained the history, evaluated lab date, reviewed imaging studies  and agree with radiology interpretations.    Sarina Ill, MD Stroke Neurology   A total of 25 minutes was spent for the care of this patient, spent on counseling patient and family on different diagnostic and therapeutic options, counseling and coordination of care, riskd ans benefits of management, compliance, or risk factor reduction and education.  To contact Stroke Continuity provider, please refer to [email or web address] After hours, contact General Neurology

## 2019-06-09 NOTE — Evaluation (Signed)
Occupational Therapy Evaluation Patient Details Name: [name] MRN: [id number] DOB: 05/27/1925 Today's Date: 06/09/2019    History of Present Illness 86 y.o. female with PMH of hypertension, hyperlipidemia, pacer for bradycardia, afib currently not on AC and recent left occipital ICH 3 weeks ago presented from SNF to ER for acute onset right gaze, left facial droop, slurred speech, right hemiplegia. LSW 8:30 am. CT showed evolving left ICH stable with the surrounding cerebral edema. CT head and neck showed right superior branch of M2 decreased caliber with intermittent stenosis concerning for nonocclusive clot comparing with her CTA 3 weeks ago.   Clinical Impression   Pt recently admitted due to left occipital ICH, pt was at SNF, on anticipated d/c date had noted change in status and was admitted to ED due to acute changes. Prior to [place] 3 weeks ago, pt was living at home and was independent with ADL/IADL and had family checking in on her daily. Pt now presents with R gaze preference, L side inattention, decreased sensation and strength of LUE and LLE, and decreased balance. She required minA for sitting balance with frequent cues to correct posture due to posterior lean. She required modA+2 for stand pivot transfer to the recliner. Pt demonstrates decreased awareness of deficits. Pt's son present during session to observe pt's current level of functioning and provide PLOF information. Due to decline in current level of function, pt would benefit from acute OT to address established goals to facilitate safe D/C to venue listed below. At this time, recommend SNF follow-up. Will continue to follow acutely.     Follow Up Recommendations  SNF;Supervision/Assistance - 24 hour    Equipment Recommendations  3 in 1 bedside commode    Recommendations for Other Services       Precautions / Restrictions Precautions Precautions: Fall Precaution Comments: Right side weakness - 3 weeks ago.  Now  left side weakness Restrictions Weight Bearing Restrictions: No Other Position/Activity Restrictions: Pt denied dizziness throughout session.  pt easily fatiqued      Mobility Bed Mobility Overal bed mobility: Needs Assistance Bed Mobility: Supine to Sit Rolling: Mod assist   Supine to sit: Mod assist;HOB elevated     General bed mobility comments: pt didnt bring left leg over.  Pt unable to scoot left side forward without assist.  Pt needed max cues for sequencing  Transfers Overall transfer level: Needs assistance Equipment used: Rolling walker (2 wheeled) Transfers: Sit to/from [facility] Sit to Stand: Min assist;From elevated surface;+2 physical assistance Stand pivot transfers: Mod assist;+2 physical assistance;From elevated surface       General transfer comment: Pt unable to follow instructions for techque. she stood from high bed with RW but unable to safely turn with RW to sit in chair.  Pt sat back down and did stand pivot transfer.  pt does well with stand but hard time turning to sit - unsure if didnt hear directions or more limited by motor planning    Balance Overall balance assessment: Needs assistance Sitting-balance support: Feet supported Sitting balance-Leahy Scale: Poor Sitting balance - Comments: Pt not aware of left UE.  Pt tended to lean posterior - she needed cues to regain posture - delayed and overall needed min assist for sitting EOB Postural control: Posterior lean Standing balance support: Bilateral upper extremity supported Standing balance-Leahy Scale: Poor Standing balance comment: reliant on RW for balance - tired quickly (after 60 seconds) started leaning to L - even with RW support  ADL either performed or assessed with clinical judgement   ADL Overall ADL's : Needs assistance/impaired Eating/Feeding: NPO   Grooming: Moderate assistance;Sitting   Upper Body Bathing: Moderate  assistance;Sitting   Lower Body Bathing: Moderate assistance;Sit to/from stand;Cueing for compensatory techniques   Upper Body Dressing : Moderate assistance;Sitting   Lower Body Dressing: Moderate assistance;Sit to/from stand;Cueing for compensatory techniques Lower Body Dressing Details (indicate cue type and reason): cues for vision. Toilet Transfer: Moderate assistance;+2 for physical assistance;Stand-pivot Toilet Transfer Details (indicate cue type and reason): modA+2 for pivot towards R Toileting- Clothing Manipulation and Hygiene: Moderate assistance;Sit to/from stand;Cueing for compensatory techniques       Functional mobility during ADLs: Moderate assistance;+2 for safety/equipment;+2 for physical assistance General ADL Comments: Pt with R gaze preference, limited by poor balance, decreased vision, decreased attention to L side and L sided weakeness     Vision Baseline Vision/History: Wears glasses;Cataracts Wears Glasses: Reading only Patient Visual Report: Peripheral vision impairment Vision Assessment?: Yes Eye Alignment: Within Functional Limits Ocular Range of Motion: Impaired-to be further tested in functional context Alignment/Gaze Preference: Gaze right Tracking/Visual Pursuits: Requires cues, head turns, or add eye shifts to track;Decreased smoothness of vertical tracking;Decreased smoothness of horizontal tracking Visual Fields: Impaired-to be further tested in functional context Additional Comments: difficult to assess to due decreased visual attention;pt with R gaze preference able to locate items in Left field with cues, increased time and effort;able to turn head to left      Perception     Praxis      Pertinent Vitals/Pain Pain Assessment: No/denies pain Faces Pain Scale: No hurt Pain Intervention(s): Monitored during session     Hand Dominance Right   Extremity/Trunk Assessment Upper Extremity Assessment Upper Extremity Assessment: LUE  deficits/detail LUE Deficits / Details: decreased attention to LUE;noted increased flexor tone in elbow;no grasp strength noted this date;elbow flexion 2/5;decreased coordination;no sensation in LUE LUE Sensation: decreased light touch;decreased proprioception LUE Coordination: decreased fine motor;decreased gross motor   Lower Extremity Assessment Lower Extremity Assessment: Defer to PT evaluation LLE Deficits / Details: left leg grossly 3/5 hip flexion, knee extension and DF LLE Sensation: decreased light touch   Cervical / Trunk Assessment Cervical / Trunk Assessment: Normal   Communication Communication Communication: HOH   Cognition Arousal/Alertness: Lethargic Behavior During Therapy: Flat affect;Impulsive Overall Cognitive Status: Impaired/Different from baseline Area of Impairment: Problem solving;Safety/judgement                         Safety/Judgement: Decreased awareness of deficits;Decreased awareness of safety   Problem Solving: Difficulty sequencing;Requires verbal cues General Comments: slowed mentation and inconsistently followed commands with delay.  Unsure at this time what was mental delay versus HOH   General Comments  Pt's son was present during session;educated him on strategies to increase awareness to L side of environment and body;    Exercises Other Exercises Other Exercises: walking in place 8 steps with RW Other Exercises: mini knee bends wtih RW 5x - cues for standing straight each time.   Shoulder Instructions      Home Living Family/patient expects to be discharged to:: Skilled nursing facility Living Arrangements: Alone;Other (Comment)(son lives next door) Available Help at Discharge: Family;Available PRN/intermittently Type of Home: House Home Access: Stairs to enter [employer] of Steps: 2 Entrance Stairs-Rails: None Home Layout: One level;Laundry or work area in basement     [employer] Shower/Tub: Medical illustrator: Standard  Home Equipment: Kasandra Knudsen - single point;Walker - 2 wheels;Shower seat   Additional Comments: Pt was living independently 3 weeks ago and had ICH with right sided weakness - to [facility] for rehab.  on day of DC home with family - new onset LEFT weakness      Prior Functioning/Environment Level of Independence: Needs assistance  Gait / Transfers Assistance Needed: uses a cane for gait both inside the home and for community access - 3 weeks ago ADL's / Homemaking Assistance Needed: granddaughter does the cleaning, others cook   Comments: was active in church precovid.        OT Problem List: Decreased activity tolerance;Impaired balance (sitting and/or standing);Impaired vision/perception;Decreased cognition;Decreased safety awareness;Decreased knowledge of use of DME or AE;Pain;Decreased knowledge of precautions;Impaired UE functional use;Impaired sensation;Impaired tone      OT Treatment/Interventions: Self-care/ADL training;Cognitive remediation/compensation;Visual/perceptual remediation/compensation;Balance training;DME and/or AE instruction    OT Goals(Current goals can be found in the care plan section) Acute Rehab OT Goals Patient Stated Goal: to get better OT Goal Formulation: With patient Time For Goal Achievement: 06/23/19 Potential to Achieve Goals: Good ADL Goals Pt Will Perform Grooming: with supervision;sitting Pt Will Perform Upper Body Dressing: with supervision;sitting Pt Will Transfer to Toilet: with min guard assist;regular height toilet Pt Will Perform Toileting - Clothing Manipulation and hygiene: with min guard assist;sit to/from stand Additional ADL Goal #1: Pt will locate ADL items in room with min cues to turn head to the left to locate items on left side due to vision loss.  OT Frequency: Min 2X/week   Barriers to D/C: Decreased caregiver support          Co-evaluation PT/OT/SLP Co-Evaluation/Treatment: Yes Reason for  Co-Treatment: Complexity of the patient's impairments (multi-system involvement);For patient/therapist safety;To address functional/ADL transfers;Necessary to address cognition/behavior during functional activity PT goals addressed during session: Mobility/safety with mobility;Balance;Proper use of DME;Strengthening/ROM OT goals addressed during session: ADL's and self-care      AM-PAC OT "6 Clicks" Daily Activity     Outcome Measure Help from another person eating meals?: Total(NPO) Help from another person taking care of personal grooming?: A Lot Help from another person toileting, which includes using toliet, bedpan, or urinal?: A Lot Help from another person bathing (including washing, rinsing, drying)?: A Lot Help from another person to put on and taking off regular upper body clothing?: A Lot Help from another person to put on and taking off regular lower body clothing?: A Lot 6 Click Score: 11   End of Session Equipment Utilized During Treatment: Rolling walker;Gait belt Nurse Communication: Mobility status  Activity Tolerance: Patient tolerated treatment well Patient left: in chair;with call bell/phone within reach;with chair alarm set  OT Visit Diagnosis: Unsteadiness on feet (R26.81);Low vision, both eyes (H54.2);Hemiplegia and hemiparesis;Muscle weakness (generalized) (M62.81);Other abnormalities of gait and mobility (R26.89) Hemiplegia - Right/Left: Left Hemiplegia - dominant/non-dominant: Non-Dominant Hemiplegia - caused by: Cerebral infarction                [id number] OT Time Calculation (min): 42 min Charges:  OT General Charges $OT Visit: 1 Visit OT Evaluation $OT Eval Moderate Complexity: 1 Mod OT Treatments $Self Care/Home Management : 8-22 mins  Helene Kelp OTR/L Acute Rehabilitation Services Office: [facility] 06/09/2019, 1:22 PM

## 2019-06-09 NOTE — Progress Notes (Signed)
SLP Cancellation Note  Patient Details Name: [name] MRN: [id number] DOB: 1925-11-19   Cancelled treatment:       Reason Eval/Treat Not Completed: Fatigue/lethargy limiting ability to participate  Orders for SLE reviewed and acknowledged.  Pt was too lethargic to participate in an SLE following the bedside swallow evaluation.  SLP will f/u when pt is more awake/alert as schedule allows.     [name] 06/09/2019, 10:33 AM

## 2019-06-09 NOTE — Progress Notes (Signed)
PROGRESS NOTE    [name]  [id number] DOB: 29-Jun-1925 DOA: 06/08/2019 PCP: [name], MD    Brief Narrative:  86 y.o. female with medical history significant of hypertension, hyperlipidemia, PPM, afib off anticoagulations and recent left occipital ICH 3 weeks ago presented from SNF to ER for new onset of slurred speech and right sided weakness. Pt was last see at her baseline about 8 AM this morning, then pt was found to have right sided gaze, left facial droop, slurred speech, left hemiplegia. CODE STROKE triggered in ED. Pt was mumbling, hard to understand her talking, but she denied any pain, no SOB, she pointed to her throat and saying she can hardly clear her throat. ED Course: CT showed evolving left ICH stable with the surrounding cerebral edema. No other ICH but right insular cortex ischemic attenuation. CT head and neck showed right superior branch of M2 decreased caliber with intermittent stenosis concerning fornonocclusive clot comparing with her CTA 3 weeks prior to admit  Assessment & Plan:   Active Problems:   Cerebral embolism with cerebral infarction   Cerebral infarction ([place])   1. R MCA stroke 1. Recent ICH prior to admit 2. Unable to undergo MRI given pacer 3. CT and repeat CT reviewed, finding of evolving stroke note 4. Goal bp for now of 130-180 5. Neurology following. On ASA. 6. PT/OT consulted, therapy rec for SNF 7. Defer anticoagulant/antiplatelet to neurology 2. HTN 1. BP stable at present, within recommended BP range currently 2. Holding bp meds at this time 3. Hyperthyroid 1. Noted to be on methimazole  2. Stable at present 4. Chronic Afib 1. Rate controlled at this time 2. Defer choice of anticoagulation to Neurology  DVT prophylaxis: Heparin subq Code Status: DNR Family Communication: Pt in room, pt's family at bedside Disposition Plan: From SNF, anticipate d/c SNF when cleared by neurology  Consultants:    Neurology  Procedures:     Antimicrobials: Anti-infectives (From admission, onward)   None       Subjective: Unable to assess as pt not communicative and appears quite drowsy  Objective: Vitals:   06/09/19 0519 06/09/19 0850 06/09/19 1100 06/09/19 1258  BP: (!) 162/80 (!) 158/87 (!) 154/71 (!) 159/76  Pulse: 72 68  61  Resp: 19 18  17   Temp:  98.5 F (36.9 C)  98.5 F (36.9 C)  TempSrc:  Axillary  Oral  SpO2: 93% 97%  98%    Intake/Output Summary (Last 24 hours) at 06/09/2019 1449 Last data filed at 06/09/2019 0400 Gross per 24 hour  Intake 219.11 ml  Output 630 ml  Net -410.89 ml   There were no vitals filed for this visit.  Examination:  General exam: Appears calm and comfortable  Respiratory system: Clear to auscultation. Respiratory effort normal. Cardiovascular system: S1 & S2 heard, Regular Gastrointestinal system: Abdomen is nondistended, soft and nontender. No organomegaly or masses felt. Normal bowel sounds heard. Central nervous system: Asleep, arousable. Generalized weakness Extremities: Symmetric 5 x 5 power. Skin: No rashes, lesions Psychiatry: unable to assess given mentation  Data Reviewed: I have personally reviewed following labs and imaging studies  CBC: Recent Labs  Lab 06/08/19 1054 06/08/19 1058 06/09/19 0444  WBC 9.0  --  8.1  NEUTROABS 7.1  --   --   HGB 12.3 13.3 11.6*  HCT 39.0 39.0 35.8*  MCV 86.5  --  82.7  PLT 427*  --  [id number]   Basic Metabolic Panel: Recent Labs  Lab  06/08/19 1054 06/08/19 1058 06/09/19 0444  NA 130* 128* 132*  K 4.4 4.1 4.5  CL 96* 96* 99  CO2 22  --  20*  GLUCOSE 103* 105* 94  BUN 9 9 7*  CREATININE 0.90 0.90 0.81  CALCIUM 9.3  --  9.1   GFR: CrCl cannot be calculated (Unknown ideal weight.). Liver Function Tests: Recent Labs  Lab 06/08/19 1054  AST 19  ALT 15  ALKPHOS 94  BILITOT 0.5  PROT 6.2*  ALBUMIN 3.3*   No results for input(s): LIPASE, AMYLASE in the last 168 hours. No  results for input(s): AMMONIA in the last 168 hours. Coagulation Profile: Recent Labs  Lab 06/08/19 1054  INR 1.1   Cardiac Enzymes: No results for input(s): CKTOTAL, CKMB, CKMBINDEX, TROPONINI in the last 168 hours. BNP (last 3 results) No results for input(s): PROBNP in the last 8760 hours. HbA1C: Recent Labs    06/09/19 0444  HGBA1C 5.4   CBG: Recent Labs  Lab 06/08/19 1049  GLUCAP 106*   Lipid Profile: Recent Labs    06/09/19 0444  CHOL 201*  HDL 52  LDLCALC 133*  TRIG 79  CHOLHDL 3.9   Thyroid Function Tests: No results for input(s): TSH, T4TOTAL, FREET4, T3FREE, THYROIDAB in the last 72 hours. Anemia Panel: No results for input(s): VITAMINB12, FOLATE, FERRITIN, TIBC, IRON, RETICCTPCT in the last 72 hours. Sepsis Labs: No results for input(s): PROCALCITON, LATICACIDVEN in the last 168 hours.  Recent Results (from the past 240 hour(s))  SARS CORONAVIRUS 2 (TAT 6-24 HRS) Nasopharyngeal Nasopharyngeal Swab     Status: None   Collection Time: 06/08/19 11:46 AM   Specimen: Nasopharyngeal Swab  Result Value Ref Range Status   SARS Coronavirus 2 NEGATIVE NEGATIVE Final    Comment: (NOTE) SARS-CoV-2 target nucleic acids are NOT DETECTED. The SARS-CoV-2 RNA is generally detectable in upper and lower respiratory specimens during the acute phase of infection. Negative results do not preclude SARS-CoV-2 infection, do not rule out co-infections with other pathogens, and should not be used as the sole basis for treatment or other patient management decisions. Negative results must be combined with clinical observations, patient history, and epidemiological information. The expected result is Negative. Fact Sheet for Patients: SugarRoll.be Fact Sheet for Healthcare Providers: https://www.woods-mathews.com/ This test is not yet approved or cleared by the [place] FDA and  has been authorized for detection and/or diagnosis  of SARS-CoV-2 by FDA under an Emergency Use Authorization (EUA). This EUA will remain  in effect (meaning this test can be used) for the duration of the COVID-19 declaration under Section 56 4(b)(1) of the Act, 21 U.S.C. section 360bbb-3(b)(1), unless the authorization is terminated or revoked sooner. Performed at [facility], [place] [address] [place], [place] [address]      Radiology Studies: CT ANGIO HEAD W OR WO CONTRAST  Result Date: 06/08/2019 CLINICAL DATA:  Left-sided weakness. EXAM: CT ANGIOGRAPHY HEAD AND NECK TECHNIQUE: Multidetector CT imaging of the head and neck was performed using the standard protocol during bolus administration of intravenous contrast. Multiplanar CT image reconstructions and MIPs were obtained to evaluate the vascular anatomy. Carotid stenosis measurements (when applicable) are obtained utilizing NASCET criteria, using the distal internal carotid diameter as the denominator. CONTRAST:  54mL OMNIPAQUE IOHEXOL 350 MG/ML SOLN COMPARISON:  CT angiogram FINDINGS: CT HEAD FINDINGS Reported separately. CTA NECK FINDINGS Aortic arch: Standard branching. Imaged portion shows no evidence of aneurysm or dissection. Calcified plaques are seen in the aortic arch  and at the origin of the major arch vessel without stenosis. Right carotid system: Normal course and caliber of the right common carotid artery. Calcified atheromatous plaques are seen in the right carotid bifurcation without hemodynamically significant stenosis. Increased tortuosity of the cervical segment of the right ACA without stenosis. Left carotid system: Normal course and caliber of the left common carotid artery. Calcified atherosclerotic plaque in the left carotid bifurcation without hemodynamically significant stenosis. Increased tortuosity of the cervical segment of the left ICA, without stenosis. Vertebral arteries: Codominant. No evidence of dissection, stenosis or occlusion. Skeleton: Degenerative  changes of the cervical spine with multilevel neural foraminal narrowing. Degenerative changes of the sternoclavicular joints. No aggressive lesion identified. Other neck: Redemonstrated goiter and esophageal diverticulum. Upper chest: Negative Review of the MIP images confirms the above findings CTA HEAD FINDINGS Anterior circulation: Calcified plaques are seen in the bilateral carotid siphons, without hemodynamically significant stenosis. Short segment of attenuation of the distal right M1/MCA at the level of the bifurcation with non opacification of the origin of the superior division branch (series 5, image 118) consistent with occlusion at the origin of the superior division branch. Comparison with prior CT angiogram also demonstrate missing superior division branches. The left MCA vascular tree in bilateral ACA vascular trees remain patent. Posterior circulation: Calcified plaque is seen at the left vertebral artery at the dural margin, without stenosis. The intracranial bilateral vertebral arteries and the basilar artery have normal course and caliber. Mild luminal irregularity throughout the bilateral PCAs, suggesting intracranial atherosclerotic disease. Venous sinuses: As permitted by contrast timing, patent. Anatomic variants: None Review of the MIP images confirms the above findings IMPRESSION: Short segment of attenuation of the distal right M1/MCA at the level of the bifurcation with non opacification of the origin of the superior division branch. Comparison with the prior CT angiogram also demonstrate missing superior division branches. I telephoned the critical test results to Dr. Erlinda Hong at 11:19 a.m. on 06/08/2019. Aortic Atherosclerosis (ICD10-I70.0). Electronically Signed   By: Pedro Earls M.D.   On: 06/08/2019 11:44   CT HEAD WO CONTRAST  Result Date: 06/09/2019 CLINICAL DATA:  Follow-up examination for acute stroke. EXAM: CT HEAD WITHOUT CONTRAST TECHNIQUE: Contiguous axial  images were obtained from the base of the skull through the vertex without intravenous contrast. COMPARISON:  Prior CT from 06/08/2019. FINDINGS: Brain: Subacute hemorrhage involving the left occipital lobe again seen, little interval change in overall size measuring approximately 3 cm in diameter, although the hematoma is perhaps slightly less dense as compared to previous. Surrounding vasogenic edema without significant regional mass effect is relatively similar. No evidence for interval bleeding. Moderate to large area of evolving cytotoxic edema seen involving the right MCA distribution, with involvement of the right insula, subinsular white matter, and overlying supra ganglionic right frontoparietal region. No associated hemorrhage or significant mass effect. No other new intracranial hemorrhage. No other acute large vessel territory infarct. No hydrocephalus or extra-axial collection. Vascular: No new hyperdense vessel. Skull: Scalp soft tissues and calvarium are stable and within normal limits. Sinuses/Orbits: Globes and orbital soft tissues demonstrate no acute finding. Paranasal sinuses are clear. No mastoid effusion. Other: None. IMPRESSION: 1. Evolving acute right MCA territory infarct. No associated hemorrhage or significant mass effect. 2. No significant interval change in size of subacute left occipital hemorrhage with similar surrounding edema. 3. No other new acute intracranial abnormality. Electronically Signed   By: Jeannine Boga M.D.   On: 06/09/2019 05:25   CT ANGIO  NECK W OR WO CONTRAST  Result Date: 06/08/2019 CLINICAL DATA:  Left-sided weakness. EXAM: CT ANGIOGRAPHY HEAD AND NECK TECHNIQUE: Multidetector CT imaging of the head and neck was performed using the standard protocol during bolus administration of intravenous contrast. Multiplanar CT image reconstructions and MIPs were obtained to evaluate the vascular anatomy. Carotid stenosis measurements (when applicable) are obtained  utilizing NASCET criteria, using the distal internal carotid diameter as the denominator. CONTRAST:  58mL OMNIPAQUE IOHEXOL 350 MG/ML SOLN COMPARISON:  CT angiogram FINDINGS: CT HEAD FINDINGS Reported separately. CTA NECK FINDINGS Aortic arch: Standard branching. Imaged portion shows no evidence of aneurysm or dissection. Calcified plaques are seen in the aortic arch and at the origin of the major arch vessel without stenosis. Right carotid system: Normal course and caliber of the right common carotid artery. Calcified atheromatous plaques are seen in the right carotid bifurcation without hemodynamically significant stenosis. Increased tortuosity of the cervical segment of the right ACA without stenosis. Left carotid system: Normal course and caliber of the left common carotid artery. Calcified atherosclerotic plaque in the left carotid bifurcation without hemodynamically significant stenosis. Increased tortuosity of the cervical segment of the left ICA, without stenosis. Vertebral arteries: Codominant. No evidence of dissection, stenosis or occlusion. Skeleton: Degenerative changes of the cervical spine with multilevel neural foraminal narrowing. Degenerative changes of the sternoclavicular joints. No aggressive lesion identified. Other neck: Redemonstrated goiter and esophageal diverticulum. Upper chest: Negative Review of the MIP images confirms the above findings CTA HEAD FINDINGS Anterior circulation: Calcified plaques are seen in the bilateral carotid siphons, without hemodynamically significant stenosis. Short segment of attenuation of the distal right M1/MCA at the level of the bifurcation with non opacification of the origin of the superior division branch (series 5, image 118) consistent with occlusion at the origin of the superior division branch. Comparison with prior CT angiogram also demonstrate missing superior division branches. The left MCA vascular tree in bilateral ACA vascular trees remain  patent. Posterior circulation: Calcified plaque is seen at the left vertebral artery at the dural margin, without stenosis. The intracranial bilateral vertebral arteries and the basilar artery have normal course and caliber. Mild luminal irregularity throughout the bilateral PCAs, suggesting intracranial atherosclerotic disease. Venous sinuses: As permitted by contrast timing, patent. Anatomic variants: None Review of the MIP images confirms the above findings IMPRESSION: Short segment of attenuation of the distal right M1/MCA at the level of the bifurcation with non opacification of the origin of the superior division branch. Comparison with the prior CT angiogram also demonstrate missing superior division branches. I telephoned the critical test results to Dr. Erlinda Hong at 11:19 a.m. on 06/08/2019. Aortic Atherosclerosis (ICD10-I70.0). Electronically Signed   By: Pedro Earls M.D.   On: 06/08/2019 11:44   DG CHEST PORT 1 VIEW  Result Date: 06/08/2019 CLINICAL DATA:  Congestive failure EXAM: PORTABLE CHEST 1 VIEW COMPARISON:  05/19/2019 FINDINGS: Cardiac shadow is mildly enlarged but stable. Pacing device is again seen. Aortic calcifications are noted. The lungs are well aerated bilaterally. No focal infiltrate or sizable effusion is seen. No bony abnormality is noted. IMPRESSION: No acute abnormality noted. Electronically Signed   By: Inez Catalina M.D.   On: 06/08/2019 17:44   CT HEAD CODE STROKE WO CONTRAST  Result Date: 06/08/2019 CLINICAL DATA:  Code stroke. EXAM: CT HEAD WITHOUT CONTRAST TECHNIQUE: Contiguous axial images were obtained from the base of the skull through the vertex without intravenous contrast. COMPARISON:  Head CT obtained February 21st 2021. FINDINGS: Brain:  New hypodensity involving the right insula, suggesting acute infarct. Redemonstrated subacute bleeding in the left occipital lobe measuring 2.5 x 1.6 cm (prior 3.1 x 2.0 cm) with surrounding vasogenic edema. Resolution  of the extension of the bleed into the occipital horn of the left lateral ventricle. No new hemorrhage identified. Vascular: No hyperdense vessel or unexpected calcification. Skull: Normal. Negative for fracture or focal lesion. Sinuses/Orbits: No acute finding. Other: None. ASPECTS [facility] Stroke Program Early CT Score) - Ganglionic level infarction (caudate, lentiform nuclei, internal capsule, insula, M1-M3 cortex): 5 - Supraganglionic infarction (M4-M6 cortex): 3 Total score (0-10 with 10 being normal): 9 IMPRESSION: 1. New hypodensity involving the right insula, suggesting acute infarct. 2. Redemonstrated is subacute bleeding in the left occipital lobe with surrounding vasogenic edema. Resolution of the extension of the bleed into the occipital horn of the left lateral ventricle. No new hemorrhage. 3. ASPECTS is 9 These results were called by telephone at the time of interpretation on 06/08/2019 at 11:00 am to provider Dr. Erlinda Hong, who verbally acknowledged these results. Electronically Signed   By: Pedro Earls M.D.   On: 06/08/2019 11:07    Scheduled Meds: . aspirin  300 mg Rectal Daily  . heparin  5,000 Units Subcutaneous Q8H  . methimazole  5 mg Oral TID  . pantoprazole (PROTONIX) IV  40 mg Intravenous Q24H   Continuous Infusions: . sodium chloride 50 mL/hr at 06/09/19 1028     LOS: 1 day   Marylu Lund, MD [facility] Pager On Amion  If 7PM-7AM, please contact night-coverage 06/09/2019, 2:49 PM

## 2019-06-09 NOTE — Progress Notes (Signed)
Did not treat this SBP of 203 because machine took bp automatically and was done during a coughing spell and while she was being suctioned orally.  Once she stopped coughing, BP was retaken and was 160.

## 2019-06-09 NOTE — Evaluation (Signed)
Clinical/Bedside Swallow Evaluation Patient Details  Name: [name] MRN: [id number] Date of Birth: 25-Apr-1925  Today's Date: 06/09/2019 Time: SLP Start Time (ACUTE ONLY): 0950 SLP Stop Time (ACUTE ONLY): 1008 SLP Time Calculation (min) (ACUTE ONLY): 18 min  Past Medical History:  Past Medical History:  Diagnosis Date  . Anxiety   . Atrial fibrillation ([place])   . Bradycardia   . Cancer ([place])    skin  . Cataract    removed bilat with lens both eyes   . Complication of anesthesia   . Difficulty sleeping   . Dysrhythmia    A FIB  . GERD (gastroesophageal reflux disease)   . H/O hiatal hernia   . History of kidney stones   . History of skin cancer   . History of transfusion   . Hyperlipidemia   . Hypertension   . Osteoarthritis   . Pacemaker   . Pneumonia   . PONV (postoperative nausea and vomiting)   . Shortness of breath    WITH EXERTION   Past Surgical History:  Past Surgical History:  Procedure Laterality Date  . APPENDECTOMY  1960  . ARTHROPLASTY  1994   Left total knee  . ARTHROPLASTY  2000   Total right knee  . BACK SURGERY    . CATARACT EXTRACTION, BILATERAL     with lens implants  . CHOLECYSTECTOMY    . COLONOSCOPY    . DG SELECTED [facility] ONLY  2002   Dilation  . Microdiskectomy  05/2004   Left L4-L5  . PACEMAKER INSERTION    . POLYPECTOMY    . TOTAL ABDOMINAL HYSTERECTOMY  1969  . TOTAL HIP ARTHROPLASTY Right 12/24/2013   Procedure: RIGHT TOTAL HIP ARTHROPLASTY ANTERIOR APPROACH;  Surgeon: Gearlean Alf, MD;  Location: [facility];  Service: Orthopedics;  Laterality: Right;  . UPPER GASTROINTESTINAL ENDOSCOPY     HPI:  [name] is a 86 y.o. female with medical history significant of hypertension, hyperlipidemia, PPM, afib off anticoagulations and recent left occipital ICH 3 weeks ago presented from SNF to ER for new onset of slurred speech and right sided weakness. Head CT on 3/13 reported "Evolving acute right MCA territory infarct. No  associated hemorrhage or significant mass effect." CXR was negative for acute abnormality.  Pt has baseline aphasia from previous CVA last month (04/2019).     Assessment / Plan / Recommendation Clinical Impression  Pt was seen for a bedside swallow evaluation in the setting of an acute evolving R MCA infarct.  Pt was encountered asleep in bed and she roused to max verbal and tactile stimulation; however she was lethargic throughout this evaluation.  Pt denied having any trouble with swallow prior to this admission.  Oral mech exam was remarkable for a L facial droop, lingual deviation to the L, and reduced lingual ROM/strength.  Oral cavity was also observed to be dry.  Pt exhibited poor bolus acceptance with all trials.  She had difficulty following commands for labial opening and she blew bubbles with the straw instead of drawing liquid from it.  Liquid was administered via spoon and siphoned straw sip.  Pt presented with reduced bolus awareness with all trials, suspected delayed AP transport, and suspected delayed swallow initiation with thin liquid trials.  An immediate cough was observed following all thin liquid and puree trials followed by a spontaneous swallow.  No swallow initiation was observed prior to coughing, therefore, suspected reduced lingual control with premature spillage to the pharynx.  Cough was observed to be weak.  A majority of the puree bolus was suctioned from the pt's oral cavity secondary to aspiration concerns.  No additional trials were attempted on this date.  Pt continued to exhibit intermittent coughing following minimal PO trials and some expiratory wheezing was observed.  RN was made aware.  Recommend continuation of NPO with frequent oral care.  SLP will f/u within the next 24-48 hours to determine readiness for clinical diet initiation vs instrumental swallow study.    SLP Visit Diagnosis: Dysphagia, unspecified (R13.10)    Aspiration Risk  Moderate aspiration risk     Diet Recommendation NPO   Medication Administration: Via alternative means    Other  Recommendations Oral Care Recommendations: Oral care QID;Staff/trained caregiver to provide oral care;Oral care prior to ice chip/H20 Other Recommendations: Have oral suction available   Follow up Recommendations Skilled Nursing facility      Frequency and Duration min 2x/week  2 weeks       Prognosis Prognosis for Safe Diet Advancement: Fair Barriers to Reach Goals: Language deficits      Swallow Study   General Date of Onset: 06/08/19 HPI: [name] is a 86 y.o. female with medical history significant of hypertension, hyperlipidemia, PPM, afib off anticoagulations and recent left occipital ICH 3 weeks ago presented from SNF to ER for new onset of slurred speech and right sided weakness. Head CT on 3/13 reported "Evolving acute right MCA territory infarct. No associated hemorrhage or significant mass effect." CXR was negative for acute abnormality.  Pt has baseline aphasia from previouis CVA last month (04/2019).   Type of Study: Bedside Swallow Evaluation Previous Swallow Assessment: None documented  Diet Prior to this Study: NPO Temperature Spikes Noted: No Respiratory Status: Room air History of Recent Intubation: No Behavior/Cognition: Lethargic/Drowsy Oral Cavity Assessment: Dry Oral Care Completed by SLP: No Oral Cavity - Dentition: (Unable to fully evaluate, but dentition was observed ) Patient Positioning: Upright in bed Baseline Vocal Quality: Normal Volitional Cough: Weak Volitional Swallow: Unable to elicit    Oral/Motor/Sensory Function Overall Oral Motor/Sensory Function: Mild impairment Facial ROM: Reduced left Facial Symmetry: Abnormal symmetry left Lingual ROM: Reduced right;Reduced left Lingual Symmetry: Abnormal symmetry left Lingual Strength: Reduced   Ice Chips Ice chips: Not tested   Thin Liquid Thin Liquid: Impaired Presentation: Spoon;Straw Oral Phase  Impairments: Poor awareness of bolus Oral Phase Functional Implications: Prolonged oral transit;Oral holding Pharyngeal  Phase Impairments: Suspected delayed Swallow;Cough - Immediate    Nectar Thick Nectar Thick Liquid: Not tested   Honey Thick Honey Thick Liquid: Not tested   Puree Puree: Impaired Presentation: Spoon Oral Phase Impairments: Poor awareness of bolus;Reduced lingual movement/coordination Oral Phase Functional Implications: Prolonged oral transit;Oral residue;Oral holding Pharyngeal Phase Impairments: Unable to trigger swallow;Cough - Immediate   Solid     Solid: Not tested     Colin Mulders M.S., CCC-SLP Acute Rehabilitation Services Office: [phone]  [place] 06/09/2019,10:27 AM

## 2019-06-10 NOTE — Progress Notes (Signed)
STROKE TEAM PROGRESS NOTE   HISTORY OF PRESENT ILLNESS (per record) [name] is a 86 y.o. Caucasian female with PMH of hypertension, hyperlipidemia, pacer for bradycardia, afib currently not on [facility] and recent left occipital ICH 3 weeks ago presented from SNF to ER for code stroke. As per EMS, pt was about to be discharged from SNF today, last seen baseline about 8:30 AM.  Shortly after, nursing SNF found she had right gaze, left facial droop, slurred speech, right hemiplegia.  EMS was called, patient sent to ER for evaluation.  Patient was admitted 3 weeks ago for left occipital ICH in the setting of Lovenox for A. fib treatment.  She was reversed with protamine, treated with BP management and PT/OT.  She was sent to SNF on discharge.  As per son, patient was able to walk with walker but needed 1 person assist, she was not independent yet.  However, before it ICH, patient was independent at home. CT showed evolving left ICH stable with the surrounding cerebral edema.  No other ICH but right insular cortex ischemic attenuation.  CT head and neck showed right superior branch of M2 decreased caliber with intermittent stenosis concerning for nonocclusive clot comparing with her CTA 3 weeks ago.  After CT, patient left arm and leg weakness much improved, however still has right gaze, left neglect and left facial droop with slurring speech. Discussed with son, given patient current situation, 32 year old, recent ICH, mRS = 3 post ICH, improving left-sided weakness, DNR status, patient may have high risk of recurrent ICH, hemorrhagic conversion, difficult extubation.  Son was in agreement not to pursue further intervention at this time.  Patient was given IV fluid and albumin for permissive hypertension. LSN: 8:30 AM tPA Given: No: Recent ICH   INTERVAL HISTORY Her nurse is at bedside, no events overnight, pending CT tomorrow 6am for Dr. Erlinda Hong to review and then consider IV heparin/anticoagulation.      OBJECTIVE Vitals:   06/09/19 2321 06/10/19 0354 06/10/19 0758 06/10/19 1217  BP: (!) 163/71 (!) 156/85 (!) 157/67 (!) 177/77  Pulse: 60 60  63  Resp: 15 18 19 16   Temp: 98.2 F (36.8 C) 98.1 F (36.7 C) 97.8 F (36.6 C) 98.5 F (36.9 C)  TempSrc: Axillary Oral Axillary Axillary  SpO2: 99% 97% 100% 98%    CBC:  Recent Labs  Lab 06/08/19 1054 06/08/19 1054 06/08/19 1058 06/09/19 0444  WBC 9.0  --   --  8.1  NEUTROABS 7.1  --   --   --   HGB 12.3   < > 13.3 11.6*  HCT 39.0   < > 39.0 35.8*  MCV 86.5  --   --  82.7  PLT 427*  --   --  242   < > = values in this interval not displayed.    Basic Metabolic Panel:  Recent Labs  Lab 06/08/19 1054 06/08/19 1054 06/08/19 1058 06/09/19 0444  NA 130*   < > 128* 132*  K 4.4   < > 4.1 4.5  CL 96*   < > 96* 99  CO2 22  --   --  20*  GLUCOSE 103*   < > 105* 94  BUN 9   < > 9 7*  CREATININE 0.90   < > 0.90 0.81  CALCIUM 9.3  --   --  9.1   < > = values in this interval not displayed.    Lipid Panel:     Component  Value Date/Time   CHOL 201 (H) 06/09/2019 0444   TRIG 79 06/09/2019 0444   HDL 52 06/09/2019 0444   CHOLHDL 3.9 06/09/2019 0444   VLDL 16 06/09/2019 0444   LDLCALC 133 (H) 06/09/2019 0444   HgbA1c:  Lab Results  Component Value Date   HGBA1C 5.4 06/09/2019   Urine Drug Screen:     Component Value Date/Time   LABOPIA NONE DETECTED 06/08/2019 1932   COCAINSCRNUR NONE DETECTED 06/08/2019 1932   LABBENZ NONE DETECTED 06/08/2019 1932   AMPHETMU NONE DETECTED 06/08/2019 1932   THCU NONE DETECTED 06/08/2019 1932   LABBARB NONE DETECTED 06/08/2019 1932    Alcohol Level     Component Value Date/Time   ETH <10 06/08/2019 1054    IMAGING  CT ANGIO HEAD W OR WO CONTRAST CT ANGIO NECK W OR WO CONTRAST 06/08/2019 IMPRESSION:  Short segment of attenuation of the distal right M1/MCA at the level of the bifurcation with non opacification of the origin of the superior division branch. Comparison with  the prior CT angiogram also demonstrate missing superior division branches. Aortic Atherosclerosis (ICD10-I70.0).   CT HEAD WO CONTRAST 06/09/2019 IMPRESSION:  1. Evolving acute right MCA territory infarct. No associated hemorrhage or significant mass effect.  2. No significant interval change in size of subacute left occipital hemorrhage with similar surrounding edema.  3. No other new acute intracranial abnormality.  DG CHEST PORT 1 VIEW 06/08/2019 IMPRESSION:  No acute abnormality noted.   CT HEAD CODE STROKE WO CONTRAST 06/08/2019 IMPRESSION:  1. New hypodensity involving the right insula, suggesting acute infarct.  2. Redemonstrated is subacute bleeding in the left occipital lobe with surrounding vasogenic edema. Resolution of the extension of the bleed into the occipital horn of the left lateral ventricle. No new hemorrhage.  3. ASPECTS is 9   CT Head WO Contrast 06/11/2019 pending  Transthoracic Echocardiogram  05/20/2019 IMPRESSIONS  1. Left ventricular ejection fraction, by estimation, is 60 to 65%. The  left ventricle has normal function. The left ventricle has no regional  wall motion abnormalities. Left ventricular diastolic parameters are  indeterminate in the setting of atrial fibrillation.  2. Right ventricular systolic function is normal. The right ventricular  size is normal. There is moderately elevated pulmonary artery systolic  pressure. The estimated right ventricular systolic pressure is 123XX123 mmHg.  3. Left atrial size was severely dilated.  4. Right atrial size was moderately dilated.  5. The mitral valve is degenerative. Trivial mitral valve regurgitation.  6. The aortic valve is tricuspid. Aortic valve regurgitation is not  visualized.  7. The inferior vena cava is dilated in size with >50% respiratory  variability, suggesting right atrial pressure of 8 mmHg.    ECG - atrial fibrillation - ventricular response 66 BPM (See cardiology reading for  complete details)   PHYSICAL EXAM Blood pressure (!) 177/77, pulse 63, temperature 98.5 F (36.9 C), temperature source Axillary, resp. rate 16, SpO2 98 %.  Neuro stable Elderly white female, in bed sitting up, lethargic/sleepy but does respond to voice and open eyes, dysarthric speech, oriented to person, location, month, follows simple commands such as lift right arm or left leg, right gaze preference does not cross the midline, blinks to threat only on the left, left lower facial weakness, tongue midline without fasciculations, hearing intact to voice, shoulder shrug intact, she has left-sided weakness but can lift lift arm and left leg anti-gravity without drift, toes equiv.    ASSESSMENT/PLAN [name] is  a 86 y.o. female with history of hypertension, hyperlipidemia, pacer for bradycardia, afib currently not on AC and recent left occipital ICH 3 weeks ago presenting with right gaze, left facial droop, slurred speech, and right hemiplegia.    She did not receive IV t-PA due to recent ICH.  Stroke:  Rt MCA infarct - embolic - likely due to atrial fibrillation not on anticoagulation due to [place]  Code Stroke CT Head - New hypodensity involving the right insula, suggesting acute infarct. Redemonstrated is subacute bleeding in the left occipital lobe with surrounding vasogenic edema. Resolution of the extension of the bleed into the occipital horn of the left lateral ventricle. No new hemorrhage. ASPECTS is 9   CT head - Evolving acute right MCA territory infarct. No associated hemorrhage or significant mass effect. No significant interval change in size of subacute left occipital hemorrhage with similar surrounding edema.   MRI head - not ordered - pacemaker  MRA head - not ordered  CTA H&N - Short segment of attenuation of the distal right M1/MCA at the level of the bifurcation with non opacification of the origin of the superior division branch. Comparison with the prior CT  angiogram also demonstrate missing superior division branches.   CT Head repeat - 06/11/19 - pending tomorrow 6am  CT Perfusion - not ordered  Carotid Doppler - CTA neck performed - carotid dopplers not indicated.  2D Echo - 05/20/19 - EF 60 - 65%. Atrial fibrillation  [employer] Virus 2 - negative  LDL - 133  HgbA1c - 5.4  UDS - negative  VTE prophylaxis - [place] Heparin Diet  Diet Order            Diet NPO time specified  Diet effective now              No antithrombotic prior to admission, now on aspirin 300 mg suppository daily. Given size of the right MCA hypodensity we will not start IV Helparin at this time, will repeat CT head Monday morning and consider IV heparin as a bridge to anticoagulation at that time. Discussed with Dr. Rosalin Hawking.  Patient will be counseled to be compliant with her antithrombotic medications  Ongoing aggressive stroke risk factor management  Therapy recommendations:  SNF  Disposition:  Pending  Hypertension  Home BP meds: Norvasc ; Apresoline ; Avapro  Current BP meds: Apresoline prn  Occasionally high SBP . Need to avoid extreme elevations of BP with recent ICH - SBP goal 130 - 180 per Dr Erlinda Hong . Long-term BP goal normotensive  Hyperlipidemia  Home Lipid lowering medication: none   LDL 133, goal < 70  Current lipid lowering medication: none / NPO   Continue statin at discharge  Other Stroke Risk Factors  Advanced age  Former cigarette smoker - quit  Hx stroke/TIA  Atrial fibrillation  Other Active Problems  Code status - DNR  Aortic Atherosclerosis (ICD10-I70.0)  Mild anemia - Hb 11.6  Hyponatremia - 130->128->132  Hospital day # 2  Unfortunate 86 year old female with embolic strokes due to afib. She was not anticoagulated due to recent ICH. She is on aspirin. Unfortunately her stroke risk is very elevated due to afib. Given size of the right MCA hypodensity we will not start IV Helparin at this time, will  repeat CT head Monday morning and consider IV heparin as a bridge to anticoagulation at that time. Discussed with Dr. Rosalin Hawking today. We will continue to follow with you.  Personally examined patient and images, and have participated in and made any corrections needed to history, physical, neuro exam,assessment and plan as stated above.  I have personally obtained the history, evaluated lab date, reviewed imaging studies and agree with radiology interpretations.    Sarina Ill, MD Stroke Neurology   A total of 15 minutes was spent for the care of this patient, spent on counseling patient and family on different diagnostic and therapeutic options, counseling and coordination of care, riskd ans benefits of management, compliance, or risk factor reduction and education.    To contact Stroke Continuity provider, please refer to [email or web address] After hours, contact General Neurology

## 2019-06-10 NOTE — Progress Notes (Signed)
  Speech Language Pathology Treatment: Dysphagia  Patient Details Name: GREGORIA VITO MRN: [id number] DOB: 1926/01/04 Today's Date: 06/10/2019 Time: [id number] SLP Time Calculation (min) (ACUTE ONLY): 30 min  Assessment / Plan / Recommendation Clinical Impression  Patient seen for dysphagia therapy and po trials. Pt's son was present as well as Therapist, sports. Oral care provided and repositioned. Patient able to follow commands for oral motor exam. Her voice is gravelly, but audible with intelligible speech. She had a significant delay in verbal answers, but fairly accurate. Trials of ice chips, thin, nectar and honey thick liquids, applesauce, and cracker. Pt tolerated solids well with mild left-sided pocketing/oral residue and additional  time needed for mastication of cracker. Pt had delayed coughing and clearing with the ice chips 2/2 trials. Delayed cough with initial tsp water sip followed by an immediate violent cough with 2nd attempt. Nectar and honey thickened juice with a delayed throat clear. Recommend continuing NPO status with medication crushed in applesauce, MBS tomorrow to further assess swallowing function to best determine physiology of swallow and determine a safe diet.    HPI HPI: CATHALEYA DELAWDER is a 86 y.o. female with medical history significant of hypertension, hyperlipidemia, PPM, afib off anticoagulations and recent left occipital ICH 3 weeks ago presented from SNF to ER for new onset of slurred speech and right sided weakness. Head CT on 3/13 reported "Evolving acute right MCA territory infarct. No associated hemorrhage or significant mass effect." CXR was negative for acute abnormality.  Pt has baseline aphasia from previous CVA last month (04/2019).        SLP Plan  MBS;Continue with current plan of care       Recommendations  Diet recommendations: NPO Medication Administration: Crushed with puree Compensations: Slow rate                Oral Care Recommendations:  Oral care QID Follow up Recommendations: Skilled Nursing facility SLP Visit Diagnosis: Dysphagia, unspecified (R13.10) Plan: MBS;Continue with current plan of care       GO                Wynelle Bourgeois., MA, CCC-SLP 06/10/2019, 2:57 PM

## 2019-06-10 NOTE — Progress Notes (Signed)
PROGRESS NOTE    [name]  [id number] DOB: 02-03-1926 DOA: 06/08/2019 PCP: [name], MD    Brief Narrative:  86 y.o. female with medical history significant of hypertension, hyperlipidemia, PPM, afib off anticoagulations and recent left occipital ICH 3 weeks ago presented from SNF to ER for new onset of slurred speech and right sided weakness. Pt was last see at her baseline about 8 AM this morning, then pt was found to have right sided gaze, left facial droop, slurred speech, left hemiplegia. CODE STROKE triggered in ED. Pt was mumbling, hard to understand her talking, but she denied any pain, no SOB, she pointed to her throat and saying she can hardly clear her throat. ED Course: CT showed evolving left ICH stable with the surrounding cerebral edema. No other ICH but right insular cortex ischemic attenuation. CT head and neck showed right superior branch of M2 decreased caliber with intermittent stenosis concerning fornonocclusive clot comparing with her CTA 3 weeks prior to admit  Assessment & Plan:   Active Problems:   Cerebral embolism with cerebral infarction   Cerebral infarction ([place])   1. R MCA stroke 1. Recent ICH prior to admit 2. Unable to undergo MRI given pacer 3. CT and repeat CT reviewed, finding of evolving stroke note 4. Goal bp for now of 130-180 5. Neurology following. On ASA. 6. PT/OT consulted, therapy rec for SNF 7. Defer anticoagulant/antiplatelet to neurology 8. Per Neurology, plan for repeat CT tomorrow AM 2. HTN 1. BP stable at present, within recommended BP range currently 2. BP meds have been held 3. Hyperthyroid 1. Noted to be on methimazole  2. Stable at present 4. Chronic Afib 1. Rate controlled at this time 2. Defer choice of anticoagulation to Neurology 3. Hemodynamically stable at this time  DVT prophylaxis: Heparin subq Code Status: DNR Family Communication: Pt in room, pt's family at bedside Disposition Plan: From  SNF, anticipate d/c SNF when cleared by neurology  Consultants:   Neurology  Procedures:     Antimicrobials: Anti-infectives (From admission, onward)   None      Subjective: Reports feeling well this AM. No chest pains or sob  Objective: Vitals:   06/10/19 1217 06/10/19 1602 06/10/19 1605 06/10/19 1613  BP: (!) 177/77 (!) 180/70 (!) 185/74 (!) 176/80  Pulse: 63 63    Resp: 16 16    Temp: 98.5 F (36.9 C) 97.8 F (36.6 C)    TempSrc: Axillary Axillary    SpO2: 98% 98%      Intake/Output Summary (Last 24 hours) at 06/10/2019 1624 Last data filed at 06/10/2019 1549 Gross per 24 hour  Intake 1231.99 ml  Output 400 ml  Net 831.99 ml   There were no vitals filed for this visit.  Examination: General exam: Awake, laying in bed, in nad Respiratory system: Normal respiratory effort, no wheezing Cardiovascular system: regular rate, s1, s2 Gastrointestinal system: Soft, nondistended, positive BS Central nervous system: CN2-12 grossly intact, strength intact Extremities: Perfused, no clubbing Skin: Normal skin turgor, no notable skin lesions seen Psychiatry: Mood normal // no visual hallucinations   Data Reviewed: I have personally reviewed following labs and imaging studies  CBC: Recent Labs  Lab 06/08/19 1054 06/08/19 1058 06/09/19 0444  WBC 9.0  --  8.1  NEUTROABS 7.1  --   --   HGB 12.3 13.3 11.6*  HCT 39.0 39.0 35.8*  MCV 86.5  --  82.7  PLT 427*  --  [id number]   Basic Metabolic  Panel: Recent Labs  Lab 06/08/19 1054 06/08/19 1058 06/09/19 0444  NA 130* 128* 132*  K 4.4 4.1 4.5  CL 96* 96* 99  CO2 22  --  20*  GLUCOSE 103* 105* 94  BUN 9 9 7*  CREATININE 0.90 0.90 0.81  CALCIUM 9.3  --  9.1   GFR: CrCl cannot be calculated (Unknown ideal weight.). Liver Function Tests: Recent Labs  Lab 06/08/19 1054  AST 19  ALT 15  ALKPHOS 94  BILITOT 0.5  PROT 6.2*  ALBUMIN 3.3*   No results for input(s): LIPASE, AMYLASE in the last 168 hours. No  results for input(s): AMMONIA in the last 168 hours. Coagulation Profile: Recent Labs  Lab 06/08/19 1054  INR 1.1   Cardiac Enzymes: No results for input(s): CKTOTAL, CKMB, CKMBINDEX, TROPONINI in the last 168 hours. BNP (last 3 results) No results for input(s): PROBNP in the last 8760 hours. HbA1C: Recent Labs    06/09/19 0444  HGBA1C 5.4   CBG: Recent Labs  Lab 06/08/19 1049  GLUCAP 106*   Lipid Profile: Recent Labs    06/09/19 0444  CHOL 201*  HDL 52  LDLCALC 133*  TRIG 79  CHOLHDL 3.9   Thyroid Function Tests: No results for input(s): TSH, T4TOTAL, FREET4, T3FREE, THYROIDAB in the last 72 hours. Anemia Panel: No results for input(s): VITAMINB12, FOLATE, FERRITIN, TIBC, IRON, RETICCTPCT in the last 72 hours. Sepsis Labs: No results for input(s): PROCALCITON, LATICACIDVEN in the last 168 hours.  Recent Results (from the past 240 hour(s))  SARS CORONAVIRUS 2 (TAT 6-24 HRS) Nasopharyngeal Nasopharyngeal Swab     Status: None   Collection Time: 06/08/19 11:46 AM   Specimen: Nasopharyngeal Swab  Result Value Ref Range Status   SARS Coronavirus 2 NEGATIVE NEGATIVE Final    Comment: (NOTE) SARS-CoV-2 target nucleic acids are NOT DETECTED. The SARS-CoV-2 RNA is generally detectable in upper and lower respiratory specimens during the acute phase of infection. Negative results do not preclude SARS-CoV-2 infection, do not rule out co-infections with other pathogens, and should not be used as the sole basis for treatment or other patient management decisions. Negative results must be combined with clinical observations, patient history, and epidemiological information. The expected result is Negative. Fact Sheet for Patients: SugarRoll.be Fact Sheet for Healthcare Providers: https://www.woods-mathews.com/ This test is not yet approved or cleared by the [place] FDA and  has been authorized for detection and/or diagnosis  of SARS-CoV-2 by FDA under an Emergency Use Authorization (EUA). This EUA will remain  in effect (meaning this test can be used) for the duration of the COVID-19 declaration under Section 56 4(b)(1) of the Act, 21 U.S.C. section 360bbb-3(b)(1), unless the authorization is terminated or revoked sooner. Performed at [facility], [place] [address] [place], [place] [address]      Radiology Studies: CT HEAD WO CONTRAST  Result Date: 06/09/2019 CLINICAL DATA:  Follow-up examination for acute stroke. EXAM: CT HEAD WITHOUT CONTRAST TECHNIQUE: Contiguous axial images were obtained from the base of the skull through the vertex without intravenous contrast. COMPARISON:  Prior CT from 06/08/2019. FINDINGS: Brain: Subacute hemorrhage involving the left occipital lobe again seen, little interval change in overall size measuring approximately 3 cm in diameter, although the hematoma is perhaps slightly less dense as compared to previous. Surrounding vasogenic edema without significant regional mass effect is relatively similar. No evidence for interval bleeding. Moderate to large area of evolving cytotoxic edema seen involving the right MCA distribution, with involvement  of the right insula, subinsular white matter, and overlying supra ganglionic right frontoparietal region. No associated hemorrhage or significant mass effect. No other new intracranial hemorrhage. No other acute large vessel territory infarct. No hydrocephalus or extra-axial collection. Vascular: No new hyperdense vessel. Skull: Scalp soft tissues and calvarium are stable and within normal limits. Sinuses/Orbits: Globes and orbital soft tissues demonstrate no acute finding. Paranasal sinuses are clear. No mastoid effusion. Other: None. IMPRESSION: 1. Evolving acute right MCA territory infarct. No associated hemorrhage or significant mass effect. 2. No significant interval change in size of subacute left occipital hemorrhage with similar  surrounding edema. 3. No other new acute intracranial abnormality. Electronically Signed   By: Jeannine Boga M.D.   On: 06/09/2019 05:25   DG CHEST PORT 1 VIEW  Result Date: 06/08/2019 CLINICAL DATA:  Congestive failure EXAM: PORTABLE CHEST 1 VIEW COMPARISON:  05/19/2019 FINDINGS: Cardiac shadow is mildly enlarged but stable. Pacing device is again seen. Aortic calcifications are noted. The lungs are well aerated bilaterally. No focal infiltrate or sizable effusion is seen. No bony abnormality is noted. IMPRESSION: No acute abnormality noted. Electronically Signed   By: Inez Catalina M.D.   On: 06/08/2019 17:44    Scheduled Meds: . aspirin  300 mg Rectal Daily  . heparin  5,000 Units Subcutaneous Q8H  . methimazole  5 mg Oral TID  . pantoprazole (PROTONIX) IV  40 mg Intravenous Q24H   Continuous Infusions: . sodium chloride 50 mL/hr at 06/10/19 0918     LOS: 2 days   Marylu Lund, MD [facility] Pager On Amion  If 7PM-7AM, please contact night-coverage 06/10/2019, 4:24 PM

## 2019-06-10 NOTE — TOC Initial Note (Signed)
Transition of Care [facility]) - Initial/Assessment Note    Patient Details  Name: [name] MRN: [id number] Date of Birth: [birth year]  Transition of Care [facility]) CM/SW Contact:    Alberteen Sam, LCSW Phone Number: 06/10/2019, 9:16 AM  Clinical Narrative:                  CSW spoke with patient's son Dominica Severin regarding discharge planning, he reports plan for patient to return to [facility] when patient medically stable.   CSW sending referral, will initiate insurance auth when patient closer to medical readiness to dc.   Expected Discharge Plan: Skilled Nursing Facility Barriers to Discharge: Continued Medical Work up   Patient Goals and CMS Choice   CMS Medicare.gov Compare Post Acute Care list provided to:: Patient Represenative (must comment)(son Dominica Severin) Choice offered to / list presented to : Adult Children  Expected Discharge Plan and Services Expected Discharge Plan: [place] Choice: [place] Living arrangements for the past 2 months: Skilled Nursing Facility(from [facility])                                      Prior Living Arrangements/Services Living arrangements for the past 2 months: Skilled Nursing Facility(from [facility]) Lives with:: Self Patient language and need for interpreter reviewed:: Yes Do you feel safe going back to the place where you live?: Yes      Need for Family Participation in Patient Care: Yes (Comment) Care giver support system in place?: Yes (comment)   Criminal Activity/Legal Involvement Pertinent to Current Situation/Hospitalization: No - Comment as needed  Activities of Daily Living      Permission Sought/Granted Permission sought to share information with : Facility Sport and exercise psychologist, Case Manager, Family Supports Permission granted to share information with : Yes, Verbal Permission Granted  Share Information with NAME: Dominica Severin  Permission granted to share info w  AGENCY: SNFs  Permission granted to share info w Relationship: son  Permission granted to share info w Contact Information: [phone]  Emotional Assessment Appearance:: Appears stated age     Orientation: : Oriented to Self, Oriented to  Time, Oriented to Situation Alcohol / Substance Use: Not Applicable Psych Involvement: No (comment)  Admission diagnosis:  CVA (cerebral vascular accident) ([place]) [I63.9] Cerebral infarction, unspecified mechanism ([place]) [I63.9] Patient Active Problem List   Diagnosis Date Noted  . Cerebral embolism with cerebral infarction 06/08/2019  . Cerebral infarction ([place]) 06/08/2019  . Hyperlipidemia 05/24/2019  . Abdominal pain 05/21/2019  . ICH (intracerebral hemorrhage) ([place]) 05/19/2019  . V tach ([place]) 08/09/2018  . Intractable nausea and vomiting 08/09/2018  . Hyponatremia 08/09/2018  . Hypomagnesemia 08/09/2018  . Hyperthyroidism 08/09/2018  . Epigastric pain   . Nonspecific chest pain 07/18/2018  . Anxiety 07/18/2018  . Insomnia 07/18/2018  . Complication of anesthesia   . Intractable headache 01/06/2017  . Intractable pain 01/04/2017  . Chronic anticoagulation 01/03/2017  . Neck pain 01/03/2017  . Acute bronchitis 06/08/2016  . Fall 09/24/2015  . Dizziness 09/24/2015  . Postoperative anemia due to acute blood loss 12/27/2013  . Postop Transfusion 12/27/2013  . OA (osteoarthritis) of hip 12/24/2013  . Preop cardiovascular exam 09/25/2013  . Cardiac pacemaker in situ 08/29/2008  . Essential hypertension 08/21/2008  . ATRIAL FIBRILLATION 08/21/2008  . Bradycardia 08/21/2008  . PNEUMONIA 08/21/2008  . GERD 08/21/2008  .  NEPHROLITHIASIS 08/21/2008  . Osteoarthritis 08/21/2008   PCP:  [name], MD Pharmacy:   [facility], [address] [place] [address] Phone: [phone] Fax: [phone]  [id number] [address], [place] [address] Phone: [phone] Fax: [phone]     Social Determinants of Health (SDOH) Interventions    Readmission Risk Interventions No flowsheet data found.

## 2019-06-10 NOTE — NC FL2 (Signed)
[place] MEDICAID FL2 LEVEL OF CARE SCREENING TOOL     IDENTIFICATION  Patient Name: [name] Birthdate: [birth year] Sex: female Admission Date (Current Location): 06/08/2019  [facility] and [place] Number:  Herbalist and Address:  The . [facility], [place] [address], [place], [place] [address]      Provider Number: [id number]  Attending Physician Name and Address:  Donne Hazel, MD  Relative Name and Phone Number:  Dominica Severin [phone]    Current Level of Care: SNF Recommended Level of Care: [place] Prior Approval Number:    Date Approved/Denied:   PASRR Number: [id number]  Discharge Plan: SNF    Current Diagnoses: Patient Active Problem List   Diagnosis Date Noted  . Cerebral embolism with cerebral infarction 06/08/2019  . Cerebral infarction ([place]) 06/08/2019  . Hyperlipidemia 05/24/2019  . Abdominal pain 05/21/2019  . ICH (intracerebral hemorrhage) ([place]) 05/19/2019  . V tach ([place]) 08/09/2018  . Intractable nausea and vomiting 08/09/2018  . Hyponatremia 08/09/2018  . Hypomagnesemia 08/09/2018  . Hyperthyroidism 08/09/2018  . Epigastric pain   . Nonspecific chest pain 07/18/2018  . Anxiety 07/18/2018  . Insomnia 07/18/2018  . Complication of anesthesia   . Intractable headache 01/06/2017  . Intractable pain 01/04/2017  . Chronic anticoagulation 01/03/2017  . Neck pain 01/03/2017  . Acute bronchitis 06/08/2016  . Fall 09/24/2015  . Dizziness 09/24/2015  . Postoperative anemia due to acute blood loss 12/27/2013  . Postop Transfusion 12/27/2013  . OA (osteoarthritis) of hip 12/24/2013  . Preop cardiovascular exam 09/25/2013  . Cardiac pacemaker in situ 08/29/2008  . Essential hypertension 08/21/2008  . ATRIAL FIBRILLATION 08/21/2008  . Bradycardia 08/21/2008  . PNEUMONIA 08/21/2008  . GERD 08/21/2008  . NEPHROLITHIASIS 08/21/2008  . Osteoarthritis 08/21/2008    Orientation RESPIRATION BLADDER Height  & Weight     Self, Time, Situation  Normal Incontinent, External catheter Weight:   Height:     BEHAVIORAL SYMPTOMS/MOOD NEUROLOGICAL BOWEL NUTRITION STATUS      Continent Diet(See Discharge Summary)  AMBULATORY STATUS COMMUNICATION OF NEEDS Skin   Extensive Assist Verbally Other (Comment)(Ecchymosis on Abdomen and Leg)                       Personal Care Assistance Level of Assistance  Bathing, Feeding, Dressing, Total care Bathing Assistance: Maximum assistance Feeding assistance: Limited assistance Dressing Assistance: Maximum assistance Total Care Assistance: Maximum assistance   Functional Limitations Info  Sight, Hearing, Speech Sight Info: Impaired Hearing Info: Adequate Speech Info: Adequate    SPECIAL CARE FACTORS FREQUENCY  PT (By licensed PT), OT (By licensed OT)     PT Frequency: Min 5x Weekly OT Frequency: Min 5x Weekly            Contractures Contractures Info: Not present    Additional Factors Info  Code Status, Allergies Code Status Info: DNR Allergies Info: Morphine and Related           Current Medications (06/10/2019):  This is the current hospital active medication list Current Facility-Administered Medications  Medication Dose Route Frequency Provider Last Rate Last Admin  . 0.9 %  sodium chloride infusion   Intravenous Continuous Rosalin Hawking, MD 50 mL/hr at 06/10/19 0918 Bolus from Bag at 06/10/19 0918  . acetaminophen (TYLENOL) tablet 650 mg  650 mg Oral Q4H PRN Wynetta Fines T, MD       Or  . acetaminophen (TYLENOL) 160 MG/5ML solution 650 mg  650  mg Per Tube Q4H PRN Wynetta Fines T, MD       Or  . acetaminophen (TYLENOL) suppository 650 mg  650 mg Rectal Q4H PRN Wynetta Fines T, MD      . aspirin suppository 300 mg  300 mg Rectal Daily Wynetta Fines T, MD   300 mg at 06/10/19 0913  . heparin injection 5,000 Units  5,000 Units Subcutaneous Q8H Rosalin Hawking, MD   5,000 Units at 06/10/19 [phone]  . hydrALAZINE (APRESOLINE) injection 5 mg  5 mg  Intravenous Q6H PRN Wynetta Fines T, MD      . methimazole (TAPAZOLE) tablet 5 mg  5 mg Oral TID Wynetta Fines T, MD      . pantoprazole (PROTONIX) injection 40 mg  40 mg Intravenous Q24H Wynetta Fines T, MD   40 mg at 06/09/19 1310  . senna-docusate (Senokot-S) tablet 1 tablet  1 tablet Oral QHS PRN Wynetta Fines T, MD      . sodium chloride HYPERTONIC 3 % nebulizer solution 4 mL  4 mL Nebulization TID PRN Shelly Coss, MD         Discharge Medications: Please see discharge summary for a list of discharge medications.  Relevant Imaging Results:  Relevant Lab Results:   Additional Information [id number]  Trula Ore, LCSWA

## 2019-06-11 ENCOUNTER — Inpatient Hospital Stay (HOSPITAL_COMMUNITY): Payer: Medicare Other

## 2019-06-11 DIAGNOSIS — D72829 Elevated white blood cell count, unspecified: Secondary | ICD-10-CM

## 2019-06-11 DIAGNOSIS — Z8679 Personal history of other diseases of the circulatory system: Secondary | ICD-10-CM

## 2019-06-11 DIAGNOSIS — I5022 Chronic systolic (congestive) heart failure: Secondary | ICD-10-CM

## 2019-06-11 DIAGNOSIS — R062 Wheezing: Secondary | ICD-10-CM

## 2019-06-11 LAB — COMPREHENSIVE METABOLIC PANEL
ALT: 21 U/L (ref 0–44)
AST: 24 U/L (ref 15–41)
Albumin: 3 g/dL — ABNORMAL LOW (ref 3.5–5.0)
Alkaline Phosphatase: 86 U/L (ref 38–126)
Anion gap: 15 (ref 5–15)
BUN: 11 mg/dL (ref 8–23)
CO2: 19 mmol/L — ABNORMAL LOW (ref 22–32)
Calcium: 9 mg/dL (ref 8.9–10.3)
Chloride: 101 mmol/L (ref 98–111)
Creatinine, Ser: 0.83 mg/dL (ref 0.44–1.00)
GFR calc Af Amer: >60 mL/min (ref >60)
GFR calc non Af Amer: >60 mL/min (ref >60)
Glucose, Bld: 87 mg/dL (ref 70–99)
Potassium: 3.5 mmol/L (ref 3.5–5.1)
Sodium: 135 mmol/L (ref 135–145)
Total Bilirubin: 1.2 mg/dL (ref 0.3–1.2)
Total Protein: 5.7 g/dL — ABNORMAL LOW (ref 6.5–8.1)

## 2019-06-11 LAB — CBC
HCT: 37.2 % (ref 36.0–46.0)
Hemoglobin: 12 g/dL (ref 12.0–15.0)
MCH: 26.8 pg (ref 26.0–34.0)
MCHC: 32.3 g/dL (ref 30.0–36.0)
MCV: 83.2 fL (ref 80.0–100.0)
Platelets: 380 10*3/uL (ref 150–400)
RBC: 4.47 MIL/uL (ref 3.87–5.11)
RDW: 21.5 % — ABNORMAL HIGH (ref 11.5–15.5)
WBC: 11 10*3/uL — ABNORMAL HIGH (ref 4.0–10.5)
nRBC: 0 % (ref 0.0–0.2)

## 2019-06-11 LAB — GLUCOSE, CAPILLARY
Glucose-Capillary: 119 mg/dL — ABNORMAL HIGH (ref 70–99)
Glucose-Capillary: 127 mg/dL — ABNORMAL HIGH (ref 70–99)

## 2019-06-11 MED ORDER — METHIMAZOLE 5 MG PO TABS
5.0000 mg | ORAL_TABLET | Freq: Three times a day (TID) | ORAL | Status: DC
  Administered 2019-06-11 – 2019-06-18 (×20): 5 mg
  Filled 2019-06-11 (×22): qty 1

## 2019-06-11 MED ORDER — ATORVASTATIN CALCIUM 40 MG PO TABS
40.0000 mg | ORAL_TABLET | Freq: Every day | ORAL | Status: DC
  Administered 2019-06-11 – 2019-06-12 (×2): 40 mg via ORAL
  Filled 2019-06-11 (×3): qty 1

## 2019-06-11 MED ORDER — PANTOPRAZOLE SODIUM 40 MG PO PACK
40.0000 mg | PACK | Freq: Every day | ORAL | Status: DC
  Administered 2019-06-11 – 2019-06-18 (×8): 40 mg
  Filled 2019-06-11 (×8): qty 20

## 2019-06-11 MED ORDER — OSMOLITE 1.2 CAL PO LIQD
1000.0000 mL | ORAL | Status: DC
  Administered 2019-06-11 – 2019-06-17 (×5): 1000 mL
  Filled 2019-06-11 (×13): qty 1000

## 2019-06-11 MED ORDER — ASPIRIN 325 MG PO TABS
325.0000 mg | ORAL_TABLET | Freq: Every day | ORAL | Status: DC
  Administered 2019-06-12: 325 mg via ORAL
  Filled 2019-06-11: qty 1

## 2019-06-11 NOTE — Progress Notes (Signed)
PROGRESS NOTE    [name]  [id number] DOB: [birth year] DOA: 06/08/2019 PCP: [name], MD    Brief Narrative:  86 y.o. female with medical history significant of hypertension, hyperlipidemia, PPM, afib off anticoagulations and recent left occipital ICH 3 weeks ago presented from SNF to ER for new onset of slurred speech and right sided weakness. Pt was last see at her baseline about 8 AM this morning, then pt was found to have right sided gaze, left facial droop, slurred speech, left hemiplegia. CODE STROKE triggered in ED. Pt was mumbling, hard to understand her talking, but she denied any pain, no SOB, she pointed to her throat and saying she can hardly clear her throat. ED Course: CT showed evolving left ICH stable with the surrounding cerebral edema. No other ICH but right insular cortex ischemic attenuation. CT head and neck showed right superior branch of M2 decreased caliber with intermittent stenosis concerning fornonocclusive clot comparing with her CTA 3 weeks prior to admit  Assessment & Plan:   Active Problems:   Cerebral embolism with cerebral infarction   Cerebral infarction ([place])   1. R MCA stroke 1. Recent ICH prior to admit 2. Unable to undergo MRI given pacer 3. CT and repeat CT reviewed, finding of evolving stroke note 4. Goal bp for now of 130-180 5. Neurology following. On ASA. 6. PT/OT consulted, therapy rec for SNF 7. Reviewed repeat CT head on 3/15, finding of evolving R MCA territory infarct, similar appearance of L occipital hemorrhage and surrounding edema. No new hemorrhage or worsening mass effect 8. Defer anticoagulation to Neurology 2. HTN 1. BP overall stable at present, within recommended BP range currently 2. BP meds currently held 3. Hyperthyroid 1. Noted to be on methimazole  2. Stable at present 4. Chronic Afib 1. Rate controlled at this time 2. Defer choice and timing of anticoagulation to Neurology 3. Hemodynamically  stable at this time  DVT prophylaxis: Heparin subq Code Status: DNR Family Communication: Pt in room, pt's family at bedside Disposition Plan: From SNF, anticipate d/c SNF when cleared by neurology  Consultants:   Neurology  Procedures:     Antimicrobials: Anti-infectives (From admission, onward)   None      Subjective: Unable to assess, very tired appearing this AM  Objective: Vitals:   06/11/19 0400 06/11/19 0900 06/11/19 1208 06/11/19 1300  BP: 137/75 (!) 144/64  (!) 161/71  Pulse:  60  64  Resp:  20  19  Temp: 98.1 F (36.7 C) 98.6 F (37 C)  98 F (36.7 C)  TempSrc: Oral Axillary  Oral  SpO2:  98% 98% 97%    Intake/Output Summary (Last 24 hours) at 06/11/2019 1332 Last data filed at 06/11/2019 [id number] Gross per 24 hour  Intake 552.43 ml  Output 750 ml  Net -197.57 ml   There were no vitals filed for this visit.  Examination: General exam: Not conversant, in no acute distress Respiratory system: normal chest rise, clear, no audible wheezing Cardiovascular system: regular rhythm, s1-s2 Gastrointestinal system: Nondistended, nontender, pos BS Central nervous system: No seizures, no tremors Extremities: No cyanosis, no joint deformities Skin: No rashes, no pallor Psychiatry: Unable to assess given current mentation    Data Reviewed: I have personally reviewed following labs and imaging studies  CBC: Recent Labs  Lab 06/08/19 1054 06/08/19 1058 06/09/19 0444 06/11/19 0430  WBC 9.0  --  8.1 11.0*  NEUTROABS 7.1  --   --   --  HGB 12.3 13.3 11.6* 12.0  HCT 39.0 39.0 35.8* 37.2  MCV 86.5  --  82.7 83.2  PLT 427*  --  242 [id number]   Basic Metabolic Panel: Recent Labs  Lab 06/08/19 1054 06/08/19 1058 06/09/19 0444 06/11/19 0430  NA 130* 128* 132* 135  K 4.4 4.1 4.5 3.5  CL 96* 96* 99 101  CO2 22  --  20* 19*  GLUCOSE 103* 105* 94 87  BUN 9 9 7* 11  CREATININE 0.90 0.90 0.81 0.83  CALCIUM 9.3  --  9.1 9.0   GFR: CrCl cannot be calculated  (Unknown ideal weight.). Liver Function Tests: Recent Labs  Lab 06/08/19 1054 06/11/19 0430  AST 19 24  ALT 15 21  ALKPHOS 94 86  BILITOT 0.5 1.2  PROT 6.2* 5.7*  ALBUMIN 3.3* 3.0*   No results for input(s): LIPASE, AMYLASE in the last 168 hours. No results for input(s): AMMONIA in the last 168 hours. Coagulation Profile: Recent Labs  Lab 06/08/19 1054  INR 1.1   Cardiac Enzymes: No results for input(s): CKTOTAL, CKMB, CKMBINDEX, TROPONINI in the last 168 hours. BNP (last 3 results) No results for input(s): PROBNP in the last 8760 hours. HbA1C: Recent Labs    06/09/19 0444  HGBA1C 5.4   CBG: Recent Labs  Lab 06/08/19 1049  GLUCAP 106*   Lipid Profile: Recent Labs    06/09/19 0444  CHOL 201*  HDL 52  LDLCALC 133*  TRIG 79  CHOLHDL 3.9   Thyroid Function Tests: No results for input(s): TSH, T4TOTAL, FREET4, T3FREE, THYROIDAB in the last 72 hours. Anemia Panel: No results for input(s): VITAMINB12, FOLATE, FERRITIN, TIBC, IRON, RETICCTPCT in the last 72 hours. Sepsis Labs: No results for input(s): PROCALCITON, LATICACIDVEN in the last 168 hours.  Recent Results (from the past 240 hour(s))  SARS CORONAVIRUS 2 (TAT 6-24 HRS) Nasopharyngeal Nasopharyngeal Swab     Status: None   Collection Time: 06/08/19 11:46 AM   Specimen: Nasopharyngeal Swab  Result Value Ref Range Status   SARS Coronavirus 2 NEGATIVE NEGATIVE Final    Comment: (NOTE) SARS-CoV-2 target nucleic acids are NOT DETECTED. The SARS-CoV-2 RNA is generally detectable in upper and lower respiratory specimens during the acute phase of infection. Negative results do not preclude SARS-CoV-2 infection, do not rule out co-infections with other pathogens, and should not be used as the sole basis for treatment or other patient management decisions. Negative results must be combined with clinical observations, patient history, and epidemiological information. The expected result is Negative. Fact  Sheet for Patients: SugarRoll.be Fact Sheet for Healthcare Providers: https://www.woods-mathews.com/ This test is not yet approved or cleared by the [place] FDA and  has been authorized for detection and/or diagnosis of SARS-CoV-2 by FDA under an Emergency Use Authorization (EUA). This EUA will remain  in effect (meaning this test can be used) for the duration of the COVID-19 declaration under Section 56 4(b)(1) of the Act, 21 U.S.C. section 360bbb-3(b)(1), unless the authorization is terminated or revoked sooner. Performed at [facility], [place] [address], [place],  [address]      Radiology Studies: CT HEAD WO CONTRAST  Result Date: 06/11/2019 CLINICAL DATA:  Stroke, follow-up EXAM: CT HEAD WITHOUT CONTRAST TECHNIQUE: Contiguous axial images were obtained from the base of the skull through the vertex without intravenous contrast. COMPARISON:  06/09/2019 FINDINGS: Brain: Hypoattenuation and loss of gray differentiation is again identified in the right MCA territory primarily involving frontoparietal lobes and insula. The area  is more well-defined as expected. Left occipital hemorrhage is similar in size with stable surrounding edema. Regional mass effect is unchanged including minor leftward midline shift. Ventricles are stable in size. There is no new hemorrhage or loss of gray-white differentiation. Vascular: No acute finding. Skull: Calvarium is unremarkable. Sinuses/Orbits: No acute finding. Other: None. IMPRESSION: Evolving right MCA territory infarction. Similar appearance of left occipital hemorrhage and surrounding edema. No new hemorrhage or worsening mass effect. Electronically Signed   By: Macy Mis M.D.   On: 06/11/2019 07:07   DG Swallowing Func-Speech Pathology  Result Date: 06/11/2019 Objective Swallowing Evaluation: Type of Study: MBS-Modified Barium Swallow Study  Patient Details Name: BIANCE DIERKER MRN:  [id number] Date of Birth: 1925-10-23 Today's Date: 06/11/2019 Time: SLP Start Time (ACUTE ONLY): 0805 -SLP Stop Time (ACUTE ONLY): 0825 SLP Time Calculation (min) (ACUTE ONLY): 20 min Past Medical History: Past Medical History: Diagnosis Date . Anxiety  . Atrial fibrillation ([place])  . Bradycardia  . Cancer ([place])   skin . Cataract   removed bilat with lens both eyes  . Complication of anesthesia  . Difficulty sleeping  . Dysrhythmia   A FIB . GERD (gastroesophageal reflux disease)  . H/O hiatal hernia  . History of kidney stones  . History of skin cancer  . History of transfusion  . Hyperlipidemia  . Hypertension  . Osteoarthritis  . Pacemaker  . Pneumonia  . PONV (postoperative nausea and vomiting)  . Shortness of breath   WITH EXERTION Past Surgical History: Past Surgical History: Procedure Laterality Date . APPENDECTOMY  1960 . ARTHROPLASTY  1994  Left total knee . ARTHROPLASTY  2000  Total right knee . BACK SURGERY   . CATARACT EXTRACTION, BILATERAL    with lens implants . CHOLECYSTECTOMY   . COLONOSCOPY   . DG SELECTED HSG GDC ONLY  2002  Dilation . Microdiskectomy  05/2004  Left L4-L5 . PACEMAKER INSERTION   . POLYPECTOMY   . TOTAL ABDOMINAL HYSTERECTOMY  1969 . TOTAL HIP ARTHROPLASTY Right 12/24/2013  Procedure: RIGHT TOTAL HIP ARTHROPLASTY ANTERIOR APPROACH;  Surgeon: Gearlean Alf, MD;  Location: [facility];  Service: Orthopedics;  Laterality: Right; . UPPER GASTROINTESTINAL ENDOSCOPY   HPI: ARASELY FELDER is a 86 y.o. female with medical history significant of hypertension, hyperlipidemia, PPM, afib off anticoagulations and recent left occipital ICH 3 weeks ago presented from SNF to ER for new onset of slurred speech and right sided weakness. Head CT on 3/13 reported "Evolving acute right MCA territory infarct. No associated hemorrhage or significant mass effect." CXR was negative for acute abnormality.  Pt has baseline aphasia from previous CVA last month (04/2019).   Subjective: Pt was lethargic Assessment /  Plan / Recommendation CHL IP CLINICAL IMPRESSIONS 06/11/2019 Clinical Impression Pt presents with oropharyngeal dysphagia across all consistencies. Oral phase is remarkable for weak lingual manipulation, oral holding, delayed AP transit, and reduced bolus cohesion resulting in premature spillage to the vallecula and the pyriform sinuses. With progression of the study, the pt became more and more lethargic, increasing oral holding, and required Max cues with a liquid wash to initiate swallow. Pharyngeal phase is remarkable for delayed swallow initiation at the pyriform sinuses  as well as reduced BOT retraction, epiglottic inversion and laryngeal closure resulting in penetration/aspiration and residue in the vallecula and pyriform sinuses. Mild aspiration occurred with thin liquids. Pt sensed aspiration producing a strong cough, but ultimately did not clear aspirate. Pt was very lethargic by the end of the  study, and very difficult to arouse, despite Max cues. Given pt's inability to maintain appropriate alert state for the study and as noted in previous sessions, recommend continuing NPO, with meds crushed in puree as the pt is alert/awake. SLP Visit Diagnosis Dysphagia, oropharyngeal phase (R13.12) Attention and concentration deficit following -- Frontal lobe and executive function deficit following -- Impact on safety and function Moderate aspiration risk   CHL IP TREATMENT RECOMMENDATION 06/11/2019 Treatment Recommendations Therapy as outlined in treatment plan below   Prognosis 06/11/2019 Prognosis for Safe Diet Advancement Fair Barriers to Reach Goals Language deficits;Cognitive deficits Barriers/Prognosis Comment -- CHL IP DIET RECOMMENDATION 06/11/2019 SLP Diet Recommendations NPO Liquid Administration via -- Medication Administration Crushed with puree Compensations -- Postural Changes --   CHL IP OTHER RECOMMENDATIONS 06/11/2019 Recommended Consults -- Oral Care Recommendations Oral care BID Other  Recommendations Have oral suction available   CHL IP FOLLOW UP RECOMMENDATIONS 06/11/2019 Follow up Recommendations Skilled Nursing facility   [facility] IP FREQUENCY AND DURATION 06/11/2019 Speech Therapy Frequency (ACUTE ONLY) min 2x/week Treatment Duration 2 weeks      CHL IP ORAL PHASE 06/11/2019 Oral Phase Impaired Oral - Pudding Teaspoon -- Oral - Pudding Cup -- Oral - Honey Teaspoon -- Oral - Honey Cup -- Oral - Nectar Teaspoon Premature spillage;Decreased bolus cohesion;Delayed oral transit;Impaired mastication;Weak lingual manipulation;Lingual/palatal residue Oral - Nectar Cup -- Oral - Nectar Straw Delayed oral transit;Decreased bolus cohesion;Premature spillage;Weak lingual manipulation;Lingual/palatal residue Oral - Thin Teaspoon Decreased bolus cohesion;Premature spillage;Delayed oral transit;Lingual/palatal residue;Weak lingual manipulation Oral - Thin Cup -- Oral - Thin Straw Premature spillage;Decreased bolus cohesion;Delayed oral transit;Lingual/palatal residue;Weak lingual manipulation Oral - Puree Delayed oral transit;Decreased bolus cohesion;Premature spillage;Lingual/palatal residue;Weak lingual manipulation;Impaired mastication;Holding of bolus Oral - Mech Soft -- Oral - Regular -- Oral - Multi-Consistency -- Oral - Pill -- Oral Phase - Comment --  CHL IP PHARYNGEAL PHASE 06/11/2019 Pharyngeal Phase Impaired Pharyngeal- Pudding Teaspoon -- Pharyngeal -- Pharyngeal- Pudding Cup -- Pharyngeal -- Pharyngeal- Honey Teaspoon -- Pharyngeal -- Pharyngeal- Honey Cup -- Pharyngeal -- Pharyngeal- Nectar Teaspoon Delayed swallow initiation-vallecula;Pharyngeal residue - pyriform Pharyngeal -- Pharyngeal- Nectar Cup -- Pharyngeal -- Pharyngeal- Nectar Straw Penetration/Aspiration during swallow;Reduced airway/laryngeal closure;Reduced epiglottic inversion;Pharyngeal residue - valleculae Pharyngeal Material enters airway, remains ABOVE vocal cords then ejected out Pharyngeal- Thin Teaspoon Delayed swallow  initiation-vallecula;Pharyngeal residue - pyriform Pharyngeal -- Pharyngeal- Thin Cup -- Pharyngeal -- Pharyngeal- Thin Straw Pharyngeal residue - pyriform;Penetration/Aspiration during swallow;Reduced airway/laryngeal closure;Delayed swallow initiation-vallecula;Trace aspiration Pharyngeal Material enters airway, passes BELOW cords and not ejected out despite cough attempt by patient Pharyngeal- Puree Penetration/Aspiration during swallow;Pharyngeal residue - valleculae;Reduced airway/laryngeal closure Pharyngeal Material enters airway, remains ABOVE vocal cords then ejected out Pharyngeal- Mechanical Soft -- Pharyngeal -- Pharyngeal- Regular -- Pharyngeal -- Pharyngeal- Multi-consistency -- Pharyngeal -- Pharyngeal- Pill -- Pharyngeal -- Pharyngeal Comment --  CHL IP CERVICAL ESOPHAGEAL PHASE 06/11/2019 Cervical Esophageal Phase WFL Pudding Teaspoon -- Pudding Cup -- Honey Teaspoon -- Honey Cup -- Nectar Teaspoon -- Nectar Cup -- Nectar Straw -- Thin Teaspoon -- Thin Cup -- Thin Straw -- Puree -- Mechanical Soft -- Regular -- Multi-consistency -- Pill -- Cervical Esophageal Comment -- Osie Bond., M.A. [place] [facility] Pager [phone] Office [phone] 06/11/2019, 11:37 AM               Scheduled Meds: . aspirin  300 mg Rectal Daily  . heparin  5,000 Units Subcutaneous Q8H  . methimazole  5 mg Oral TID  . pantoprazole (PROTONIX) IV  40 mg  Intravenous Q24H   Continuous Infusions: . sodium chloride 50 mL (06/11/19 0726)     LOS: 3 days   Marylu Lund, MD [facility] Pager On Amion  If 7PM-7AM, please contact night-coverage 06/11/2019, 1:32 PM

## 2019-06-11 NOTE — Procedures (Signed)
Cortrak  Person Inserting Tube:  Jacklynn Barnacle E, RD Tube Type:  Cortrak - 43 inches Tube Location:  Right nare Initial Placement:  Stomach Secured by: Bridle Technique Used to Measure Tube Placement:  Documented cm marking at nare/ corner of mouth Cortrak Secured At:  64 cm    Cortrak Tube Team Note:  Consult received to place a Cortrak feeding tube.   No x-ray is required. RN may begin using tube.   If the tube becomes dislodged please keep the tube and contact the Cortrak team at [email or web address] (password [email or web address]) for replacement.  If after hours and replacement cannot be delayed, place a NG tube and confirm placement with an abdominal x-ray.    Jacklynn Barnacle, MS, RD, LDN Pager number available on Amion

## 2019-06-11 NOTE — Progress Notes (Signed)
STROKE TEAM PROGRESS NOTE   INTERVAL HISTORY Pt son [name] at bedside, also OT to help her back to bed. As per OT, pt doing better today with minimal assist, she was able to walk with walker from bed to the door and went back. Her left UE and LE strength continues to improve, left gaze able to cross midline now but still limited left gaze. Still has left neglect. Dysarthria is slowing improving too.    OBJECTIVE Vitals:   06/11/19 0400 06/11/19 0900 06/11/19 1208 06/11/19 1300  BP: 137/75 (!) 144/64  (!) 161/71  Pulse:  60  64  Resp:  20  19  Temp: 98.1 F (36.7 C) 98.6 F (37 C)  98 F (36.7 C)  TempSrc: Oral Axillary  Oral  SpO2:  98% 98% 97%   CBC:  Recent Labs  Lab 06/08/19 1054 06/08/19 1058 06/09/19 0444 06/11/19 0430  WBC 9.0   < > 8.1 11.0*  NEUTROABS 7.1  --   --   --   HGB 12.3   < > 11.6* 12.0  HCT 39.0   < > 35.8* 37.2  MCV 86.5   < > 82.7 83.2  PLT 427*   < > 242 380   < > = values in this interval not displayed.   Basic Metabolic Panel:  Recent Labs  Lab 06/09/19 0444 06/11/19 0430  NA 132* 135  K 4.5 3.5  CL 99 101  CO2 20* 19*  GLUCOSE 94 87  BUN 7* 11  CREATININE 0.81 0.83  CALCIUM 9.1 9.0   Lipid Panel:     Component Value Date/Time   CHOL 201 (H) 06/09/2019 0444   TRIG 79 06/09/2019 0444   HDL 52 06/09/2019 0444   CHOLHDL 3.9 06/09/2019 0444   VLDL 16 06/09/2019 0444   LDLCALC 133 (H) 06/09/2019 0444   HgbA1c:  Lab Results  Component Value Date   HGBA1C 5.4 06/09/2019   Urine Drug Screen:     Component Value Date/Time   LABOPIA NONE DETECTED 06/08/2019 1932   COCAINSCRNUR NONE DETECTED 06/08/2019 1932   LABBENZ NONE DETECTED 06/08/2019 1932   AMPHETMU NONE DETECTED 06/08/2019 1932   THCU NONE DETECTED 06/08/2019 1932   LABBARB NONE DETECTED 06/08/2019 1932    Alcohol Level     Component Value Date/Time   ETH <10 06/08/2019 1054    IMAGING CT HEAD CODE STROKE WO CONTRAST 06/08/2019 IMPRESSION:  1. New hypodensity  involving the right insula, suggesting acute infarct.  2. Redemonstrated is subacute bleeding in the left occipital lobe with surrounding vasogenic edema. Resolution of the extension of the bleed into the occipital horn of the left lateral ventricle. No new hemorrhage.  3. ASPECTS is 9   CT ANGIO HEAD W OR WO CONTRAST CT ANGIO NECK W OR WO CONTRAST 06/08/2019 IMPRESSION:  Short segment of attenuation of the distal right M1/MCA at the level of the bifurcation with non opacification of the origin of the superior division branch. Comparison with the prior CT angiogram also demonstrate missing superior division branches. Aortic Atherosclerosis (ICD10-I70.0).   DG CHEST PORT 1 VIEW 06/08/2019 IMPRESSION:  No acute abnormality noted.   CT HEAD WO CONTRAST 06/09/2019 IMPRESSION:  1. Evolving acute right MCA territory infarct. No associated hemorrhage or significant mass effect.  2. No significant interval change in size of subacute left occipital hemorrhage with similar surrounding edema.  3. No other new acute intracranial abnormality.  CT Head WO Contrast 06/11/2019 Evolving right MCA territory infarction.  Similar appearance of left occipital hemorrhage and surrounding edema. No new hemorrhage or worsening mass effect.   Transthoracic Echocardiogram  05/20/2019 IMPRESSIONS  1. Left ventricular ejection fraction, by estimation, is 60 to 65%. The left ventricle has normal function. The left ventricle has no regional wall motion abnormalities. Left ventricular diastolic parameters are indeterminate in the setting of atrial fibrillation.  2. Right ventricular systolic function is normal. The right ventricular size is normal. There is moderately elevated pulmonary artery systolic pressure. The estimated right ventricular systolic pressure is 123XX123 mmHg.  3. Left atrial size was severely dilated.  4. Right atrial size was moderately dilated.  5. The mitral valve is degenerative. Trivial mitral  valve regurgitation.  6. The aortic valve is tricuspid. Aortic valve regurgitation is not visualized.  7. The inferior vena cava is dilated in size with >50% respiratory variability, suggesting right atrial pressure of 8 mmHg.   ECG - atrial fibrillation - ventricular response 66 BPM (See cardiology reading for complete details)   PHYSICAL EXAM  Blood pressure (!) 161/71, pulse 64, temperature 98 F (36.7 C), temperature source Oral, resp. rate 19, SpO2 97 %.  General - well nourished, well developed, sleepy and lethargic.    Ophthalmologic - fundi not visualized due to noncooperation.    Cardiovascular - irregularly irregular heart rate and rhythm  Neuro - sleepy and lethargic, but easily open eyes on voice, moderate dysarthria, orientated to time, place, and people.  No aphasia, following all simple commands, however, paucity of speech, able to repeat simple sentences with moderate dysarthria.  Right gaze preference, but able to cross midline with limited left gaze.  Left neglect, not blinking to visual threat on the left, blinking to visual threat on the right.  Left facial droop.  Tongue midline.  Left upper extremity4/5 proximal but 2-/5 finger grip and wrist extension.  Left lower extremity 4/5. RUE and RLE 4/5 at least. DTR 1+, no Babinski. Sensation, coordination not corporative and gait not tested.   ASSESSMENT/PLAN [name] is a 86 y.o. female with history of hypertension, hyperlipidemia, pacer for bradycardia, afib currently not on AC and recent left occipital ICH 3 weeks ago presenting with right gaze, left facial droop, slurred speech, and right hemiplegia.    She did not receive IV t-PA due to recent ICH.  Stroke:  Rt MCA infarct - embolic - likely due to atrial fibrillation not on anticoagulation due to recent [place]  CT Head - New hypodensity involving the R insula. Redemonstrated is subacute bleeding in the L occipital lobe with surrounding vasogenic edema.  Resolution of occipital horn blood. No new hemorrhage. ASPECTS is 9   CT head - Evolving acute R MCA territory infarct. ICH stable.   CTA H&N - Short segment of attenuation of the distal right M1/MCA at the level of the bifurcation with non opacification of the origin of the superior division branch. Comparison with the prior CT angiogram also demonstrate missing superior division branches.   CT Head repeat - 06/11/19 - evolving R large MCA infarct. L occipital ICH w/ edema unchanged.  2D Echo - 05/20/19 - EF 60 - 65%. Atrial fibrillation  [employer] Virus 2 - negative  LDL - 133  HgbA1c - 5.4  VTE prophylaxis - [place] Heparin  No antithrombotic prior to admission, now on aspirin 300 mg suppository daily. Given size of the right MCA hypodensity, no IV Helparin at this time. Consider IV heparin as a bridge to eliquis in 2-3 days.  Therapy recommendations:  SNF  Disposition:  Pending  Chronic Atrial Fibrillation  Home anticoagulation:  none given recent hemorrhage   No AC at this time given large R MCA infarct and recent ICH.  Consider IV heparin as a bridge to eliquis in 2-3 days.   Hx of ICH  05/19/2019 - left occipital lobe ICH in the setting of full dose lovenox for atrial fibrillation and hypertension while awaiting cervical injection.   Reversed with protamine, treated with BP management and hold off AC.    Discharge to SNF   Hypertension  Home BP meds: Norvasc ; Apresoline ; Avapro  Current BP meds: Apresoline prn  Off IVF . SBP goal 130 - 160 for now . Long-term BP goal normotensive  Hyperlipidemia  Home Lipid lowering medication: none   LDL 133, goal < 70  Current lipid lowering medication: lipitor 40   Continue statin on discharge  Dysphagia . Secondary to stroke . Failed MBSS . No diet, can have meds crushed in puree . Speech on board . cortrak placement requested . Start TF once cortrak in place   Wheezing  expirational wheezing  D/c  IVF  Consider lasix if needed  Leukocytosis WBC 8.1->11.0  CXR pending  Other Stroke Risk Factors  Advanced age  Former cigarette smoker - quit  Other Active Problems  Code status - DNR  Aortic Atherosclerosis (ICD10-I70.0)  Mild anemia - Hb 11.6-12 - resolved  Hyponatremia, chronic - 130->128->132->135   Hyperthryoid  Hospital day # 3  I spent  35 minutes in total face-to-face time with the patient, more than 50% of which was spent in counseling and coordination of care, reviewing test results, images and medication, and discussing the diagnosis of right large MCA infarct, neglect, afib not on AC, recent ICH, hypertension, treatment plan and potential prognosis. This patient's care requiresreview of multiple databases, neurological assessment, discussion with family, other specialists and medical decision making of high complexity. I had long discussion with son at bedside, updated pt current condition, treatment plan and potential prognosis, and answered all the questions. Son expressed understanding and appreciation.    Rosalin Hawking, MD PhD Stroke Neurology 06/11/2019 3:01 PM     To contact Stroke Continuity provider, please refer to [email or web address] After hours, contact General Neurology

## 2019-06-11 NOTE — Progress Notes (Signed)
Occupational Therapy Treatment Patient Details Name: [name] MRN: [id number] DOB: March 04, 1926 Today's Date: 06/11/2019    History of present illness 86 y.o. female with PMH of hypertension, hyperlipidemia, pacer for bradycardia, afib currently not on AC and recent left occipital ICH 3 weeks ago presented from SNF to ER for acute onset right gaze, left facial droop, slurred speech, right hemiplegia. LSW 8:30 am. CT showed evolving left ICH stable with the surrounding cerebral edema. CT head and neck showed right superior branch of M2 decreased caliber with intermittent stenosis concerning for nonocclusive clot comparing with her CTA 3 weeks ago.   OT comments  This 86 yo female admitted with above seen a second time today due patient wanted to get back to bed and staff wanted to put a new version of purewick on her. Could tell pt was fatigued from being up in recliner for quite awhile and she was complaining of her back hurting. Pt did well with vision tasks of session when supported (sitting in recliner and lying in bed, but when trying to get her to look left in an unsupported position (standing)--she was unable to look left. She will continue to benefit from acute OT with follow up at SNF.  Follow Up Recommendations  SNF;Supervision/Assistance - 24 hour    Equipment Recommendations  3 in 1 bedside commode       Precautions / Restrictions Precautions Precautions: Fall Precaution Comments: Right side weakness - 3 weeks ago.  Now left side weakness Restrictions Weight Bearing Restrictions: No Other Position/Activity Restrictions: No dizziness this session       Mobility Bed Mobility Overal bed mobility: Needs Assistance Bed Mobility: Sit to Supine     Sit to supine: Mod assist    Transfers Overall transfer level: Needs assistance Equipment used: Rolling walker (2 wheeled) Transfers: Sit to/from [facility] Sit to Stand: Min assist Stand pivot transfers:  Min assist;+2 safety/equipment       General transfer comment: Pt needed A to manuver RW at times    Balance Overall balance assessment: Needs assistance Sitting-balance support: No upper extremity supported;Feet supported Sitting balance-Leahy Scale: Fair Sitting balance - Comments: Decreased awareness of LUE--but would use intermittently Postural control: Left lateral lean Standing balance support: Bilateral upper extremity supported Standing balance-Leahy Scale: Poor Standing balance comment: reliant on RW for balance and additional external support                              Vision Baseline Vision/History: Wears glasses;Cataracts Wears Glasses: Reading only Patient Visual Report: Peripheral vision impairment(to left) Additional Comments: Continues with right gaze preference, but will look left with head and eyes when prompted. Does better with this when isolated (ie: sitting supported and having her look left versus standing and having her look left)          Cognition Arousal/Alertness: Awake/alert Behavior During Therapy: Flat affect Overall Cognitive Status: Impaired/Different from baseline Area of Impairment: Safety/judgement;Problem solving                         Safety/Judgement: Decreased awareness of safety;Decreased awareness of deficits   Problem Solving: Slow processing;Decreased initiation;Difficulty sequencing                   Pertinent Vitals/ Pain       Pain Assessment: Faces Faces Pain Scale: Hurts little more Pain Location: back (pt had been sitting  up for quite a while and was sacral sitting) Pain Descriptors / Indicators: Aching;Sore Pain Intervention(s): Limited activity within patient's tolerance;Monitored during session;Repositioned         Frequency  Min 2X/week        Progress Toward Goals  OT Goals(current goals can now be found in the care plan section)  Progress towards OT goals: Not progressing  toward goals - comment(fatigued from being up for a while)  Acute Rehab OT Goals Patient Stated Goal: to get out of chair due to back hurting her OT Goal Formulation: With patient Time For Goal Achievement: 06/23/19 Potential to Achieve Goals: Good  Plan Discharge plan remains appropriate    Co-evaluation    PT/OT/SLP Co-Evaluation/Treatment: Yes Reason for Co-Treatment: Complexity of the patient's impairments (multi-system involvement);For patient/therapist safety;To address functional/ADL transfers PT goals addressed during session: Mobility/safety with mobility;Balance;Proper use of DME;Strengthening/ROM OT goals addressed during session: Strengthening/ROM;ADL's and self-care      AM-PAC OT "6 Clicks" Daily Activity     Outcome Measure   Help from another person eating meals?: Total(NPO) Help from another person taking care of personal grooming?: A Little Help from another person toileting, which includes using toliet, bedpan, or urinal?: A Lot Help from another person bathing (including washing, rinsing, drying)?: A Lot Help from another person to put on and taking off regular upper body clothing?: A Lot Help from another person to put on and taking off regular lower body clothing?: A Lot 6 Click Score: 12    End of Session Equipment Utilized During Treatment: Gait belt;Rolling walker  OT Visit Diagnosis: Unsteadiness on feet (R26.81);Low vision, both eyes (H54.2);Hemiplegia and hemiparesis;Muscle weakness (generalized) (M62.81);Other abnormalities of gait and mobility (R26.89) Hemiplegia - Right/Left: Left Hemiplegia - dominant/non-dominant: Non-Dominant Hemiplegia - caused by: Cerebral infarction   Activity Tolerance Patient tolerated treatment well   Patient Left in bed(NT getting her repostiioned)   Nurse Communication (written on board)        [id number] OT Time Calculation (min): 10 min  Charges: OT General Charges $OT Visit: 1 Visit OT  Treatments $Self Care/Home Management : 8-22 mins  Tye Maryland, OTR/L [place] Pager [phone] Office [phone]     06/11/2019, 3:22 PM

## 2019-06-11 NOTE — Progress Notes (Signed)
Occupational Therapy Treatment Patient Details Name: [name] MRN: [id number] DOB: 1925/10/15 Today's Date: 06/11/2019    History of present illness 86 y.o. female with PMH of hypertension, hyperlipidemia, pacer for bradycardia, afib currently not on AC and recent left occipital ICH 3 weeks ago presented from SNF to ER for acute onset right gaze, left facial droop, slurred speech, right hemiplegia. LSW 8:30 am. CT showed evolving left ICH stable with the surrounding cerebral edema. CT head and neck showed right superior branch of M2 decreased caliber with intermittent stenosis concerning for nonocclusive clot comparing with her CTA 3 weeks ago.   OT comments  This 86 yo female admitted with above presents to acute OT with making great gains this session, decreased need for A for bed mobility, transfers, and ambulation. Attempting to use LUE ~50% of time overall and visually doing better with looking left when cued. She will continue to benefit from acute OT with follow up at SNF.  Follow Up Recommendations  SNF;Supervision/Assistance - 24 hour    Equipment Recommendations  3 in 1 bedside commode       Precautions / Restrictions Precautions Precautions: Fall Precaution Comments: Right side weakness - 3 weeks ago.  Now left side weakness Restrictions Other Position/Activity Restrictions: No dizziness this session       Mobility Bed Mobility Overal bed mobility: Needs Assistance Bed Mobility: Supine to Sit     Supine to sit: Min assist;HOB elevated     General bed mobility comments: needed A to scoot left hip out to EOB  Transfers Overall transfer level: Needs assistance Equipment used: Rolling walker (2 wheeled) Transfers: Sit to/from [facility] Sit to Stand: Min assist Stand pivot transfers: Min assist;+2 safety/equipment       General transfer comment: Pt needed A to manuver RW at times. Pt reaching back readily with RUE for stand>sit and 75% of  time also with LUE    Balance Overall balance assessment: Needs assistance Sitting-balance support: No upper extremity supported;Feet supported Sitting balance-Leahy Scale: Fair   Postural control: Left lateral lean Standing balance support: Bilateral upper extremity supported Standing balance-Leahy Scale: Poor Standing balance comment: reliant on RW for balance, left lateral lean once ambulated to sink and stopped                           ADL either performed or assessed with clinical judgement   ADL Overall ADL's : Needs assistance/impaired       Grooming Details (indicate cue type and reason): min A to thoroughly comb hair (only needed A down the middle in the back), min guard A standing at sink to wipe barium off of mouth                 Toilet Transfer: Minimal assistance;+2 for safety/equipment;Stand-pivot;BSC   Toileting- Clothing Manipulation and Hygiene: Total assistance Toileting - Clothing Manipulation Details (indicate cue type and reason): min A sit <>stand             Vision Baseline Vision/History: Wears glasses;Cataracts Wears Glasses: Reading only Additional Comments: continues to want to gaze right, but will look left with head and eyes when prompted. Had her look for the door on her far left and she as able to tell me that it had window in in.          Cognition Arousal/Alertness: Awake/alert Behavior During Therapy: Flat affect Overall Cognitive Status: Impaired/Different from baseline Area of Impairment: Safety/judgement;Problem  solving                         Safety/Judgement: Decreased awareness of safety;Decreased awareness of deficits   Problem Solving: Difficulty sequencing;Requires verbal cues;Requires tactile cues General Comments: Pt is hard of hearing                   Pertinent Vitals/ Pain       Pain Assessment: No/denies pain         Frequency  Min 2X/week        Progress Toward  Goals  OT Goals(current goals can now be found in the care plan section)  Progress towards OT goals: Progressing toward goals  Acute Rehab OT Goals Patient Stated Goal: to get better OT Goal Formulation: With patient Time For Goal Achievement: 06/23/19 Potential to Achieve Goals: Good  Plan Discharge plan remains appropriate    Co-evaluation    PT/OT/SLP Co-Evaluation/Treatment: Yes Reason for Co-Treatment: For patient/therapist safety;To address functional/ADL transfers PT goals addressed during session: Mobility/safety with mobility;Balance;Proper use of DME;Strengthening/ROM OT goals addressed during session: Strengthening/ROM;ADL's and self-care      AM-PAC OT "6 Clicks" Daily Activity     Outcome Measure   Help from another person eating meals?: Total(NPO) Help from another person taking care of personal grooming?: A Little Help from another person toileting, which includes using toliet, bedpan, or urinal?: A Lot Help from another person bathing (including washing, rinsing, drying)?: A Lot Help from another person to put on and taking off regular upper body clothing?: A Lot Help from another person to put on and taking off regular lower body clothing?: A Lot 6 Click Score: 12    End of Session Equipment Utilized During Treatment: Rolling walker;Gait belt  OT Visit Diagnosis: Unsteadiness on feet (R26.81);Low vision, both eyes (H54.2);Hemiplegia and hemiparesis;Muscle weakness (generalized) (M62.81);Other abnormalities of gait and mobility (R26.89) Hemiplegia - Right/Left: Left Hemiplegia - dominant/non-dominant: Non-Dominant Hemiplegia - caused by: Cerebral infarction   Activity Tolerance Patient tolerated treatment well   Patient Left in chair;with call bell/phone within reach;with chair alarm set   Nurse Communication (written on board)        [id number] OT Time Calculation (min): 42 min  Charges: OT General Charges $OT Visit: 1 Visit OT  Treatments $Self Care/Home Management : 23-37 mins  Tye Maryland , OTR/L [place] Pager [phone] Office [phone]      06/11/2019, 11:53 AM

## 2019-06-11 NOTE — Progress Notes (Signed)
Modified Barium Swallow Progress Note  Patient Details  Name: [name] MRN: [id number] Date of Birth: 1926/02/15  Today's Date: 06/11/2019  Modified Barium Swallow completed.  Full report located under Chart Review in the Imaging Section.  Brief recommendations include the following:  Clinical Impression   Pt presents with oropharyngeal dysphagia across all consistencies. Oral phase is remarkable for weak lingual manipulation, oral holding, delayed AP transit, and reduced bolus cohesion resulting in premature spillage to the vallecula and the pyriform sinuses. With progression of the study, the pt became more and more lethargic, increasing oral holding, and required Max cues with a liquid wash to initiate swallow. Pharyngeal phase is remarkable for delayed swallow initiation at the pyriform sinuses  as well as reduced BOT retraction, epiglottic inversion and laryngeal closure resulting in penetration/aspiration and residue in the vallecula and pyriform sinuses. Mild aspiration occurred with thin liquids. Pt sensed aspiration producing a strong cough, but ultimately did not clear aspirate. Pt was very lethargic by the end of the study, and very difficult to arouse, despite Max cues. Given pt's inability to maintain appropriate alert state for the study and as noted in previous sessions, recommend continuing NPO, with meds crushed in puree as the pt is alert/awake.    Swallow Evaluation Recommendations       SLP Diet Recommendations: NPO       Medication Administration: Crushed with puree   Supervision: Full supervision/cueing for compensatory strategies           Oral Care Recommendations: Oral care BID   Other Recommendations: Have oral suction available   Aline August, Student SLP Office: [phone]  06/11/2019,8:57 AM

## 2019-06-11 NOTE — Progress Notes (Signed)
Physical Therapy Treatment Patient Details Name: [name] MRN: [id number] DOB: 20-Oct-1925 Today's Date: 06/11/2019    History of Present Illness 86 y.o. female with PMH of hypertension, hyperlipidemia, pacer for bradycardia, afib currently not on AC and recent left occipital ICH 3 weeks ago presented from SNF to ER for acute onset right gaze, left facial droop, slurred speech, right hemiplegia. LSW 8:30 am. CT showed evolving left ICH stable with the surrounding cerebral edema. CT head and neck showed right superior branch of M2 decreased caliber with intermittent stenosis concerning for nonocclusive clot comparing with her CTA 3 weeks ago.    PT Comments    Pt in bed upon arrival of PT/OT, agreeable to session with focus on progression of functional mobility and transfers. The pt continues to present with limitations in functional mobility, power, dynamic stability, and endurance compared to their prior level of function and independence due to above dx. The pt's course is further complicated by residual weakness from prior stroke. The pt was able to demo improvements in bed mobility, transfer ability, and was able to demo two short bouts of walking in her room with standing self-care activities between each bout of walking. The pt fatigues quickly which significantly increases her risk of falling, and will therefore continue to benefit from skilled PT to maximize functional strengthening and endurance prior to d/c.     Follow Up Recommendations  SNF;Supervision/Assistance - 24 hour     Equipment Recommendations  None recommended by PT(TBD to post-acute)    Recommendations for Other Services       Precautions / Restrictions Precautions Precautions: Fall Precaution Comments: Right side weakness - 3 weeks ago.  Now left side weakness Restrictions Weight Bearing Restrictions: No Other Position/Activity Restrictions: No dizziness this session    Mobility  Bed Mobility Overal bed  mobility: Needs Assistance Bed Mobility: Supine to Sit     Supine to sit: Min assist;HOB elevated     General bed mobility comments: needed A to scoot left hip out to EOB, able to initially come to sit without assist, VCs only  Transfers Overall transfer level: Needs assistance Equipment used: Rolling walker (2 wheeled) Transfers: Sit to/from [facility] Sit to Stand: Min assist Stand pivot transfers: Min assist;+2 safety/equipment       General transfer comment: Pt needed A to manuver RW at times. Pt reaching back readily with RUE for stand>sit and 75% of time also with LUE  Ambulation/Gait Ambulation/Gait assistance: +2 physical assistance;Min assist;Min guard Gait Distance (Feet): 10 Feet(10 ft x 2) Assistive device: Rolling walker (2 wheeled) Gait Pattern/deviations: Step-through pattern;Decreased stride length;Decreased step length - left;Narrow base of support;Drifts right/left Gait velocity: decreased Gait velocity interpretation: <1.8 ft/sec, indicate of risk for recurrent falls General Gait Details: Pt stood with RW and took steps in room, pt initially needed minA to maintain RW, progerssed to minG for safety. Pt fatigues more quickly on left side, eventually taking smaller and smaller steps with LLE, until it is far behind her which causes her to lose balance. Pt benefits from reminers to sit when fatigued, poor awareness of endurance.   Stairs             Wheelchair Mobility    Modified Rankin (Stroke Patients Only) Modified Rankin (Stroke Patients Only) Pre-Morbid Rankin Score: Moderately severe disability Modified Rankin: Severe disability     Balance Overall balance assessment: Needs assistance Sitting-balance support: No upper extremity supported;Feet supported Sitting balance-Leahy Scale: Fair Sitting balance - Comments:  Pt not aware of left UE.  Pt tended to lean posterior - she needed cues to regain posture - delayed and overall  needed min assist for sitting EOB Postural control: Left lateral lean Standing balance support: Bilateral upper extremity supported Standing balance-Leahy Scale: Poor Standing balance comment: reliant on RW for balance, left lateral lean once ambulated to sink and stopped                            Cognition Arousal/Alertness: Awake/alert Behavior During Therapy: Flat affect Overall Cognitive Status: Impaired/Different from baseline Area of Impairment: Safety/judgement;Problem solving                         Safety/Judgement: Decreased awareness of safety;Decreased awareness of deficits   Problem Solving: Difficulty sequencing;Requires verbal cues;Requires tactile cues General Comments: Pt is hard of hearing, but pleasant and cooperative with therapy session, decreased awareness of L-side, but able to turn and find items with verbal cues      Exercises      General Comments        Pertinent Vitals/Pain Pain Assessment: No/denies pain Pain Intervention(s): Limited activity within patient's tolerance;Monitored during session    Home Living                      Prior Function            PT Goals (current goals can now be found in the care plan section) Acute Rehab PT Goals Patient Stated Goal: to get better PT Goal Formulation: With patient Time For Goal Achievement: 06/23/19 Potential to Achieve Goals: Fair Progress towards PT goals: Progressing toward goals    Frequency    Min 3X/week      PT Plan Current plan remains appropriate    Co-evaluation PT/OT/SLP Co-Evaluation/Treatment: Yes Reason for Co-Treatment: Complexity of the patient's impairments (multi-system involvement);For patient/therapist safety;To address functional/ADL transfers PT goals addressed during session: Mobility/safety with mobility;Balance;Proper use of DME;Strengthening/ROM OT goals addressed during session: Strengthening/ROM;ADL's and self-care       AM-PAC PT "6 Clicks" Mobility   Outcome Measure  Help needed turning from your back to your side while in a flat bed without using bedrails?: A Little Help needed moving from lying on your back to sitting on the side of a flat bed without using bedrails?: A Little Help needed moving to and from a bed to a chair (including a wheelchair)?: A Little Help needed standing up from a chair using your arms (e.g., wheelchair or bedside chair)?: A Little Help needed to walk in hospital room?: A Lot Help needed climbing 3-5 steps with a railing? : Total 6 Click Score: 15    End of Session Equipment Utilized During Treatment: Gait belt Activity Tolerance: Patient tolerated treatment well Patient left: in chair;with chair alarm set;with call bell/phone within reach Nurse Communication: Mobility status PT Visit Diagnosis: Other abnormalities of gait and mobility (R26.89);History of falling (Z91.81);Muscle weakness (generalized) (M62.81);Difficulty in walking, not elsewhere classified (R26.2);Hemiplegia and hemiparesis Hemiplegia - Right/Left: Left Hemiplegia - dominant/non-dominant: Non-dominant Hemiplegia - caused by: Cerebral infarction     Time: 0922-1004 PT Time Calculation (min) (ACUTE ONLY): 42 min  Charges:  $Gait Training: 8-22 mins                     Karma Ganja, PT, DPT   Acute Rehabilitation Department Pager #: [phone]  Otho Bellows 06/11/2019, 12:16 PM

## 2019-06-11 NOTE — Consult Note (Signed)
   [facility] Inpatient Consult   06/11/2019  JASIBE VALLEZ 1925-10-06 [id number]   Patient was assessed for [facility] for community services. Patient has previously been with [facility] Management team for post hospital follow up. Patient Is currently a readmission within the past 30 days.  Patient with [employer] reviewed and patient is new to [facility], HX with [facility] was with [employer].  Chart review and patient is currently being recommended for a skilled nursing level of care for post hospital transition.  No [facility] Community needs noted as patient is for a skilled nursing facility stay for rehab. For additional questions or referrals please contact:   Natividad Brood, RN BSN [facility] Liaison  [phone] business mobile phone Toll free office [phone]  Fax number: [phone] [email or web address]

## 2019-06-11 NOTE — Care Management Important Message (Signed)
Important Message  Patient Details  Name: [name] MRN: [id number] Date of Birth: Jul 31, 1925   Medicare Important Message Given:  Yes     Orbie Pyo 06/11/2019, 2:27 PM

## 2019-06-12 DIAGNOSIS — I482 Chronic atrial fibrillation, unspecified: Secondary | ICD-10-CM

## 2019-06-12 DIAGNOSIS — I5023 Acute on chronic systolic (congestive) heart failure: Secondary | ICD-10-CM

## 2019-06-12 DIAGNOSIS — R1312 Dysphagia, oropharyngeal phase: Secondary | ICD-10-CM

## 2019-06-12 LAB — GLUCOSE, CAPILLARY
Glucose-Capillary: 158 mg/dL — ABNORMAL HIGH (ref 70–99)
Glucose-Capillary: 129 mg/dL — ABNORMAL HIGH (ref 70–99)
Glucose-Capillary: 139 mg/dL — ABNORMAL HIGH (ref 70–99)
Glucose-Capillary: 161 mg/dL — ABNORMAL HIGH (ref 70–99)

## 2019-06-12 MED ORDER — IRBESARTAN 300 MG PO TABS: 300.0000 mg | ORAL_TABLET | Freq: Every morning | ORAL | Status: DC

## 2019-06-12 MED ORDER — FUROSEMIDE 10 MG/ML IJ SOLN
40.0000 mg | Freq: Every day | INTRAMUSCULAR | Status: DC
  Administered 2019-06-12 – 2019-06-18 (×7): 40 mg via INTRAVENOUS
  Filled 2019-06-12 (×7): qty 4

## 2019-06-12 MED ORDER — PRO-STAT SUGAR FREE PO LIQD
30.0000 mL | Freq: Every day | ORAL | Status: DC
  Administered 2019-06-12 – 2019-06-18 (×7): 30 mL
  Filled 2019-06-12 (×7): qty 30

## 2019-06-12 MED ORDER — IRBESARTAN 300 MG PO TABS
300.0000 mg | ORAL_TABLET | Freq: Every morning | ORAL | Status: DC
  Administered 2019-06-12 – 2019-06-18 (×7): 300 mg
  Filled 2019-06-12 (×7): qty 1

## 2019-06-12 MED ORDER — IPRATROPIUM-ALBUTEROL 0.5-2.5 (3) MG/3ML IN SOLN: 3.0000 mL | Freq: Four times a day (QID) | RESPIRATORY_TRACT | Status: DC | PRN

## 2019-06-12 MED ORDER — FREE WATER
140.0000 mL | Freq: Four times a day (QID) | Status: DC
  Administered 2019-06-12 – 2019-06-18 (×26): 140 mL

## 2019-06-12 NOTE — Progress Notes (Signed)
BG 161, [place] informed

## 2019-06-12 NOTE — Progress Notes (Signed)
Initial Nutrition Assessment  DOCUMENTATION CODES:   Not applicable  INTERVENTION:  Osmolite 1.2 cal @ 39ml/hr (1229ml) via Cortrak   11ml Pro-stat daily  122ml free water flushes QID (538ml) or per MD/PA  Tube feeding regimen will provide 1540 kcal, 81 grams of protein, 1544 ml free water   NUTRITION DIAGNOSIS:   Inadequate oral intake related to inability to eat as evidenced by NPO status.   GOAL:   Patient will meet greater than or equal to 90% of their needs   MONITOR:   TF tolerance, Supplement acceptance, PO intake, Weight trends, Labs  REASON FOR ASSESSMENT:   Consult Enteral/tube feeding initiation and management  ASSESSMENT:   Pt from SNF with a PMH significant for HTN, HLD, PPM, Afib, recent left ICH; admitted with right MCA infarct.  3/15 failed MBS, Cortrak placed (gastric)  Pt asleep at time of RD visit and did not wake to RD voice. Pt's son in room at time of visit and reports pt has always "ate like a bird." For breakfast, pt typically has Frosted Flakes. For lunch, she will either have 1/2 a pimiento cheese sandwich or something from [place]. For dinner, pt will eat whatever is brought to her from her children, typically something like spaghetti or pot roast. Pt's son reports pt snacks on fruit cups and occasional Ensures. Pt's son states pt's weight has appeared stable over the last year.   Pt currently has orders for Osmolite 1.2 cal @ 70ml/hr.  Labs and medications reviewed.   NUTRITION - FOCUSED PHYSICAL EXAM:    Most Recent Value  Orbital Region  No depletion  Upper Arm Region  No depletion  Thoracic and Lumbar Region  No depletion  Buccal Region  No depletion  Temple Region  No depletion  Clavicle Bone Region  No depletion  Clavicle and Acromion Bone Region  No depletion  Scapular Bone Region  No depletion  Dorsal Hand  No depletion  Patellar Region  No depletion  Anterior Thigh Region  No depletion  Posterior Calf Region  No depletion   Edema (RD Assessment)  Mild  Hair  Reviewed  Eyes  Reviewed  Mouth  Reviewed  Skin  Reviewed  Nails  Reviewed       Diet Order:   Diet Order            Diet NPO time specified  Diet effective now              EDUCATION NEEDS:   No education needs have been identified at this time  Skin:  Skin Assessment: Reviewed RN Assessment  Last BM:  3/13  Height:   Ht Readings from Last 1 Encounters:  05/18/19 5\' 3"  (1.6 m)    Weight:   Wt Readings from Last 6 Encounters:  06/12/19 66.7 kg  05/18/19 68 kg  03/13/19 70.8 kg  01/26/19 70.2 kg  08/12/18 70.8 kg  08/11/18 70.8 kg    BMI:  Body mass index is 26.04 kg/m.  Estimated Nutritional Needs:   Kcal:  1350-1550  Protein:  70-85 grams  Fluid:  >/= 1.35L   Larkin Ina, MS, RD, LDN RD pager number and weekend/on-call pager number located in [place].

## 2019-06-12 NOTE — Progress Notes (Signed)
STROKE TEAM PROGRESS NOTE   INTERVAL HISTORY RN and son at the bedside.  Patient awake, alert, still lethargic.  Right gaze preference much improved, able to have left gaze now.  Still has weakness on the left upper and lower but stable.  Still has wheezing, CXR unremarkable, will order DuoNeb.  OBJECTIVE Vitals:   06/12/19 0300 06/12/19 0500 06/12/19 0831 06/12/19 1101  BP: (!) 173/72  (!) 183/71 (!) 156/61  Pulse: (!) 59  62 64  Resp: 17  16 17   Temp: 99.1 F (37.3 C)  97.8 F (36.6 C) 98 F (36.7 C)  TempSrc: Oral  Oral Oral  SpO2: 98%  97% 97%  Weight:  66.7 kg     CBC:  Recent Labs  Lab 06/08/19 1054 06/08/19 1058 06/09/19 0444 06/11/19 0430  WBC 9.0   < > 8.1 11.0*  NEUTROABS 7.1  --   --   --   HGB 12.3   < > 11.6* 12.0  HCT 39.0   < > 35.8* 37.2  MCV 86.5   < > 82.7 83.2  PLT 427*   < > 242 380   < > = values in this interval not displayed.   Basic Metabolic Panel:  Recent Labs  Lab 06/09/19 0444 06/11/19 0430  NA 132* 135  K 4.5 3.5  CL 99 101  CO2 20* 19*  GLUCOSE 94 87  BUN 7* 11  CREATININE 0.81 0.83  CALCIUM 9.1 9.0   Lipid Panel:     Component Value Date/Time   CHOL 201 (H) 06/09/2019 0444   TRIG 79 06/09/2019 0444   HDL 52 06/09/2019 0444   CHOLHDL 3.9 06/09/2019 0444   VLDL 16 06/09/2019 0444   LDLCALC 133 (H) 06/09/2019 0444   HgbA1c:  Lab Results  Component Value Date   HGBA1C 5.4 06/09/2019   Urine Drug Screen:     Component Value Date/Time   LABOPIA NONE DETECTED 06/08/2019 1932   COCAINSCRNUR NONE DETECTED 06/08/2019 1932   LABBENZ NONE DETECTED 06/08/2019 1932   AMPHETMU NONE DETECTED 06/08/2019 1932   THCU NONE DETECTED 06/08/2019 1932   LABBARB NONE DETECTED 06/08/2019 1932    Alcohol Level     Component Value Date/Time   ETH <10 06/08/2019 1054    IMAGING CT HEAD CODE STROKE WO CONTRAST 06/08/2019 IMPRESSION:  1. New hypodensity involving the right insula, suggesting acute infarct.  2. Redemonstrated is  subacute bleeding in the left occipital lobe with surrounding vasogenic edema. Resolution of the extension of the bleed into the occipital horn of the left lateral ventricle. No new hemorrhage.  3. ASPECTS is 9   CT ANGIO HEAD W OR WO CONTRAST CT ANGIO NECK W OR WO CONTRAST 06/08/2019 IMPRESSION:  Short segment of attenuation of the distal right M1/MCA at the level of the bifurcation with non opacification of the origin of the superior division branch. Comparison with the prior CT angiogram also demonstrate missing superior division branches. Aortic Atherosclerosis (ICD10-I70.0).   DG CHEST PORT 1 VIEW 06/08/2019 IMPRESSION:  No acute abnormality noted.   CT HEAD WO CONTRAST 06/09/2019 IMPRESSION:  1. Evolving acute right MCA territory infarct. No associated hemorrhage or significant mass effect.  2. No significant interval change in size of subacute left occipital hemorrhage with similar surrounding edema.  3. No other new acute intracranial abnormality.  CT Head WO Contrast 06/11/2019 Evolving right MCA territory infarction. Similar appearance of left occipital hemorrhage and surrounding edema. No new hemorrhage or worsening mass effect.  Transthoracic Echocardiogram  05/20/2019 IMPRESSIONS  1. Left ventricular ejection fraction, by estimation, is 60 to 65%. The left ventricle has normal function. The left ventricle has no regional wall motion abnormalities. Left ventricular diastolic parameters are indeterminate in the setting of atrial fibrillation.  2. Right ventricular systolic function is normal. The right ventricular size is normal. There is moderately elevated pulmonary artery systolic pressure. The estimated right ventricular systolic pressure is 123XX123 mmHg.  3. Left atrial size was severely dilated.  4. Right atrial size was moderately dilated.  5. The mitral valve is degenerative. Trivial mitral valve regurgitation.  6. The aortic valve is tricuspid. Aortic valve  regurgitation is not visualized.  7. The inferior vena cava is dilated in size with >50% respiratory variability, suggesting right atrial pressure of 8 mmHg.   CXR 06/12/2019 No acute cardiopulmonary disease. Mild left basilar atelectasis.  ECG - atrial fibrillation - ventricular response 66 BPM (See cardiology reading for complete details)   PHYSICAL EXAM Blood pressure (!) 156/61, pulse 64, temperature 98 F (36.7 C), temperature source Oral, resp. rate 17, weight 66.7 kg, SpO2 97 %.  General - well nourished, well developed, mildly lethargic.    Ophthalmologic - fundi not visualized due to noncooperation.    Cardiovascular - irregularly irregular heart rate and rhythm  Neuro - mildly lethargic, eyes open, moderate dysarthria, orientated to time, place, and people.  No aphasia, following all simple commands, however, paucity of speech, able to repeat simple sentences with moderate dysarthria.  Right gaze preference, but able to have complete left gaze.  Not blinking to visual threat on the left, blinking to visual threat on the right.  Left facial droop.  Tongue midline.  Left upper extremity 4/5 proximal but 3/5 finger grip and 2/5 wrist extension.  Left lower extremity 3/5. RUE and RLE 4/5 at least. DTR 1+, no Babinski. Sensation, coordination not corporative and gait not tested.   ASSESSMENT/PLAN [name] is a 86 y.o. female with history of hypertension, hyperlipidemia, pacer for bradycardia, afib currently not on AC and recent left occipital ICH 3 weeks ago presenting with right gaze, left facial droop, slurred speech, and right hemiplegia.    She did not receive IV t-PA due to recent ICH.  Stroke:  Rt MCA infarct - embolic - likely due to atrial fibrillation not on anticoagulation due to recent [place]  CT Head - New hypodensity involving the R insula. Redemonstrated is subacute bleeding in the L occipital lobe with surrounding vasogenic edema. Resolution of occipital  horn blood. No new hemorrhage. ASPECTS is 9   CT head - Evolving acute R MCA territory infarct. ICH stable.   CTA H&N - Short segment of attenuation of the distal right M1/MCA at the level of the bifurcation with non opacification of the origin of the superior division branch. Comparison with the prior CT angiogram also demonstrate missing superior division branches.   CT Head repeat - 06/11/19 - evolving R large MCA infarct. L occipital ICH w/ edema unchanged.  2D Echo - 05/20/19 - EF 60 - 65%. Atrial fibrillation  [employer] Virus 2 - negative  LDL - 133  HgbA1c - 5.4  VTE prophylaxis - [facility] Heparin  No antithrombotic prior to admission, now on aspirin 300 mg suppository daily. Given size of the right MCA hypodensity, no IV Helparin at this time. Consider IV heparin as a bridge to eliquis in 2-3 days.  Therapy recommendations:  SNF  Disposition:  Pending  Chronic Atrial Fibrillation  Home anticoagulation:  none given recent hemorrhage   No AC at this time given large R MCA infarct and recent ICH.  Consider IV heparin as a bridge to eliquis in 2-3 days.   Hx of ICH  05/19/2019 - left occipital lobe ICH in the setting of full dose lovenox for atrial fibrillation and hypertension while awaiting cervical injection.   Reversed with protamine, treated with BP management and hold off AC.    Discharge to SNF   Hypertension  Home BP meds: Norvasc ; Apresoline ; Avapro  Current BP meds: Apresoline prn  Off IVF . SBP goal 130 - 160 for now . Long-term BP goal normotensive  Hyperlipidemia  Home Lipid lowering medication: none   LDL 133, goal < 70  Current lipid lowering medication: lipitor 40   Continue statin on discharge  Dysphagia . Secondary to stroke . Failed MBSS . NPO . cortrak placed, On TF . Speech on board  Hx Chronic diastolic CHF  expirational wheezing  D/c IVF  On lasix PTA, added back IV  Leukocytosis WBC 8.1->11.0  CXR NAD, mild L basilar  atx  Put on DuoNeb nebulization  Other Stroke Risk Factors  Advanced age  Former cigarette smoker - quit  Other Active Problems  Code status - DNR  Aortic Atherosclerosis (ICD10-I70.0)  Mild anemia - Hb 11.6-12 - resolved  Hyponatremia, chronic - 130->128->132->135   Hyperthryoid  Hospital day # 4  I had long discussion with son at bedside, updated pt current condition, treatment plan and potential prognosis, and answered all the questions.  He expressed understanding and appreciation.   Rosalin Hawking, MD PhD Stroke Neurology 06/12/2019 2:49 PM     To contact Stroke Continuity provider, please refer to [email or web address] After hours, contact General Neurology

## 2019-06-12 NOTE — Progress Notes (Signed)
PROGRESS NOTE    [name]  [id number] DOB: 05/13/1925 DOA: 06/08/2019 PCP: [name], MD    Brief Narrative:  86 y.o. female with medical history significant of hypertension, hyperlipidemia, PPM, afib off anticoagulations and recent left occipital ICH 3 weeks ago presented from SNF to ER for new onset of slurred speech and right sided weakness. Pt was last see at her baseline about 8 AM this morning, then pt was found to have right sided gaze, left facial droop, slurred speech, left hemiplegia. CODE STROKE triggered in ED. Pt was mumbling, hard to understand her talking, but she denied any pain, no SOB, she pointed to her throat and saying she can hardly clear her throat. ED Course: CT showed evolving left ICH stable with the surrounding cerebral edema. No other ICH but right insular cortex ischemic attenuation. CT head and neck showed right superior branch of M2 decreased caliber with intermittent stenosis concerning fornonocclusive clot comparing with her CTA 3 weeks prior to admit  Assessment & Plan:   Active Problems:   Cerebral embolism with cerebral infarction   Cerebral infarction ([place])   1. R MCA stroke 1. Recent ICH prior to admit 2. Unable to undergo MRI given pacer 3. CT and repeat CT reviewed, finding of evolving stroke note 4. Goal bp for now of 130-180 5. Neurology following. On ASA. 6. PT/OT consulted, therapy rec for SNF 7. Reviewed repeat CT head on 3/15, finding of evolving R MCA territory infarct, similar appearance of L occipital hemorrhage and surrounding edema. No new hemorrhage or worsening mass effect 8. Discussed with Neurology. Given size of stroke, recommendation for continued heparin gtt for 24-48hrs and then consider transition to NOAC 2. HTN 1. Per Neuro, SBP goal is 130-160 for now 2. BP elevated this AM. Have resumed home irbesartan 3. Cont to gradually resume bp meds as tolerated and if needed 3. Hyperthyroid 1. Noted to be on  methimazole  2. Remains stable at present 4. Chronic Afib 1. Rate controlled at this time 2. Plan of anticoagulation per above 3. Hemodynamically stable at this time 5. Hx chronic diastolic chf 1. Wheezing on exam 2. UE appears swollen. 3. LVEF normal on 2d echo, reviewed 4. Pt on lasix at home. Will start daily IV lasix 5. Follow daily wt and I/o  DVT prophylaxis: Heparin subq Code Status: DNR Family Communication: Pt in room, pt's family at bedside Disposition Plan: From SNF, anticipate d/c SNF when cleared by neurology and when off heparin gtt, on NOAC  Consultants:   Neurology  Procedures:     Antimicrobials: Anti-infectives (From admission, onward)   None      Subjective: In one word answers, without complaints  Objective: Vitals:   06/12/19 0300 06/12/19 0500 06/12/19 0831 06/12/19 1101  BP: (!) 173/72  (!) 183/71 (!) 156/61  Pulse: (!) 59  62 64  Resp: 17  16 17   Temp: 99.1 F (37.3 C)  97.8 F (36.6 C) 98 F (36.7 C)  TempSrc: Oral  Oral Oral  SpO2: 98%  97% 97%  Weight:  66.7 kg      Intake/Output Summary (Last 24 hours) at 06/12/2019 1427 Last data filed at 06/12/2019 1000 Gross per 24 hour  Intake 180 ml  Output 250 ml  Net -70 ml   Filed Weights   06/12/19 0500  Weight: 66.7 kg    Examination: General exam: Conversant, in no acute distress Respiratory system: normal chest rise, clear, expiratory wheeze Cardiovascular system: regular rhythm,  s1-s2 Gastrointestinal system: Nondistended, nontender, pos BS Central nervous system: No seizures, no tremors Extremities: No cyanosis, no joint deformities, generalized nonpitting edema Skin: No rashes, no pallor Psychiatry: Affect normal // no auditory hallucinations   Data Reviewed: I have personally reviewed following labs and imaging studies  CBC: Recent Labs  Lab 06/08/19 1054 06/08/19 1058 06/09/19 0444 06/11/19 0430  WBC 9.0  --  8.1 11.0*  NEUTROABS 7.1  --   --   --   HGB 12.3  13.3 11.6* 12.0  HCT 39.0 39.0 35.8* 37.2  MCV 86.5  --  82.7 83.2  PLT 427*  --  242 [id number]   Basic Metabolic Panel: Recent Labs  Lab 06/08/19 1054 06/08/19 1058 06/09/19 0444 06/11/19 0430  NA 130* 128* 132* 135  K 4.4 4.1 4.5 3.5  CL 96* 96* 99 101  CO2 22  --  20* 19*  GLUCOSE 103* 105* 94 87  BUN 9 9 7* 11  CREATININE 0.90 0.90 0.81 0.83  CALCIUM 9.3  --  9.1 9.0   GFR: Estimated Creatinine Clearance: 40.5 mL/min (by C-G formula based on SCr of 0.83 mg/dL). Liver Function Tests: Recent Labs  Lab 06/08/19 1054 06/11/19 0430  AST 19 24  ALT 15 21  ALKPHOS 94 86  BILITOT 0.5 1.2  PROT 6.2* 5.7*  ALBUMIN 3.3* 3.0*   No results for input(s): LIPASE, AMYLASE in the last 168 hours. No results for input(s): AMMONIA in the last 168 hours. Coagulation Profile: Recent Labs  Lab 06/08/19 1054  INR 1.1   Cardiac Enzymes: No results for input(s): CKTOTAL, CKMB, CKMBINDEX, TROPONINI in the last 168 hours. BNP (last 3 results) No results for input(s): PROBNP in the last 8760 hours. HbA1C: No results for input(s): HGBA1C in the last 72 hours. CBG: Recent Labs  Lab 06/08/19 1049 06/11/19 2108 06/11/19 2316 06/12/19 0419 06/12/19 1306  GLUCAP 106* 119* 127* 158* 129*   Lipid Profile: No results for input(s): CHOL, HDL, LDLCALC, TRIG, CHOLHDL, LDLDIRECT in the last 72 hours. Thyroid Function Tests: No results for input(s): TSH, T4TOTAL, FREET4, T3FREE, THYROIDAB in the last 72 hours. Anemia Panel: No results for input(s): VITAMINB12, FOLATE, FERRITIN, TIBC, IRON, RETICCTPCT in the last 72 hours. Sepsis Labs: No results for input(s): PROCALCITON, LATICACIDVEN in the last 168 hours.  Recent Results (from the past 240 hour(s))  SARS CORONAVIRUS 2 (TAT 6-24 HRS) Nasopharyngeal Nasopharyngeal Swab     Status: None   Collection Time: 06/08/19 11:46 AM   Specimen: Nasopharyngeal Swab  Result Value Ref Range Status   SARS Coronavirus 2 NEGATIVE NEGATIVE Final     Comment: (NOTE) SARS-CoV-2 target nucleic acids are NOT DETECTED. The SARS-CoV-2 RNA is generally detectable in upper and lower respiratory specimens during the acute phase of infection. Negative results do not preclude SARS-CoV-2 infection, do not rule out co-infections with other pathogens, and should not be used as the sole basis for treatment or other patient management decisions. Negative results must be combined with clinical observations, patient history, and epidemiological information. The expected result is Negative. Fact Sheet for Patients: SugarRoll.be Fact Sheet for Healthcare Providers: https://www.woods-mathews.com/ This test is not yet approved or cleared by the [place] FDA and  has been authorized for detection and/or diagnosis of SARS-CoV-2 by FDA under an Emergency Use Authorization (EUA). This EUA will remain  in effect (meaning this test can be used) for the duration of the COVID-19 declaration under Section 56 4(b)(1) of the Act, 21 U.S.C. section 360bbb-3(b)(1),  unless the authorization is terminated or revoked sooner. Performed at [facility], [place] [address] [place], [place] [address]      Radiology Studies: CT HEAD WO CONTRAST  Result Date: 06/11/2019 CLINICAL DATA:  Stroke, follow-up EXAM: CT HEAD WITHOUT CONTRAST TECHNIQUE: Contiguous axial images were obtained from the base of the skull through the vertex without intravenous contrast. COMPARISON:  06/09/2019 FINDINGS: Brain: Hypoattenuation and loss of gray differentiation is again identified in the right MCA territory primarily involving frontoparietal lobes and insula. The area is more well-defined as expected. Left occipital hemorrhage is similar in size with stable surrounding edema. Regional mass effect is unchanged including minor leftward midline shift. Ventricles are stable in size. There is no new hemorrhage or loss of gray-white differentiation.  Vascular: No acute finding. Skull: Calvarium is unremarkable. Sinuses/Orbits: No acute finding. Other: None. IMPRESSION: Evolving right MCA territory infarction. Similar appearance of left occipital hemorrhage and surrounding edema. No new hemorrhage or worsening mass effect. Electronically Signed   By: Macy Mis M.D.   On: 06/11/2019 07:07   DG CHEST PORT 1 VIEW  Result Date: 06/11/2019 CLINICAL DATA:  CHF EXAM: PORTABLE CHEST 1 VIEW COMPARISON:  06/08/2019 FINDINGS: There is left basilar atelectasis. There is no focal consolidation. There is no pleural effusion or pneumothorax. The heart mediastinum are stable. There is a single lead cardiac pacemaker. There is no acute osseous abnormality. IMPRESSION: No acute cardiopulmonary disease. Mild left basilar atelectasis. Electronically Signed   By: Kathreen Devoid   On: 06/11/2019 15:30   DG Swallowing Func-Speech Pathology  Result Date: 06/11/2019 Objective Swallowing Evaluation: Type of Study: MBS-Modified Barium Swallow Study  Patient Details Name: CAILY VASILAKIS MRN: [id number] Date of Birth: 11-26-1925 Today's Date: 06/11/2019 Time: SLP Start Time (ACUTE ONLY): 0805 -SLP Stop Time (ACUTE ONLY): 0825 SLP Time Calculation (min) (ACUTE ONLY): 20 min Past Medical History: Past Medical History: Diagnosis Date . Anxiety  . Atrial fibrillation ([place])  . Bradycardia  . Cancer ([place])   skin . Cataract   removed bilat with lens both eyes  . Complication of anesthesia  . Difficulty sleeping  . Dysrhythmia   A FIB . GERD (gastroesophageal reflux disease)  . H/O hiatal hernia  . History of kidney stones  . History of skin cancer  . History of transfusion  . Hyperlipidemia  . Hypertension  . Osteoarthritis  . Pacemaker  . Pneumonia  . PONV (postoperative nausea and vomiting)  . Shortness of breath   WITH EXERTION Past Surgical History: Past Surgical History: Procedure Laterality Date . APPENDECTOMY  1960 . ARTHROPLASTY  1994  Left total knee . ARTHROPLASTY  2000  Total  right knee . BACK SURGERY   . CATARACT EXTRACTION, BILATERAL    with lens implants . CHOLECYSTECTOMY   . COLONOSCOPY   . DG SELECTED HSG GDC ONLY  2002  Dilation . Microdiskectomy  05/2004  Left L4-L5 . PACEMAKER INSERTION   . POLYPECTOMY   . TOTAL ABDOMINAL HYSTERECTOMY  1969 . TOTAL HIP ARTHROPLASTY Right 12/24/2013  Procedure: RIGHT TOTAL HIP ARTHROPLASTY ANTERIOR APPROACH;  Surgeon: Gearlean Alf, MD;  Location: [facility];  Service: Orthopedics;  Laterality: Right; . UPPER GASTROINTESTINAL ENDOSCOPY   HPI: ANELIE BOWDISH is a 86 y.o. female with medical history significant of hypertension, hyperlipidemia, PPM, afib off anticoagulations and recent left occipital ICH 3 weeks ago presented from SNF to ER for new onset of slurred speech and right sided weakness. Head CT on 3/13 reported "Evolving  acute right MCA territory infarct. No associated hemorrhage or significant mass effect." CXR was negative for acute abnormality.  Pt has baseline aphasia from previous CVA last month (04/2019).   Subjective: Pt was lethargic Assessment / Plan / Recommendation CHL IP CLINICAL IMPRESSIONS 06/11/2019 Clinical Impression Pt presents with oropharyngeal dysphagia across all consistencies. Oral phase is remarkable for weak lingual manipulation, oral holding, delayed AP transit, and reduced bolus cohesion resulting in premature spillage to the vallecula and the pyriform sinuses. With progression of the study, the pt became more and more lethargic, increasing oral holding, and required Max cues with a liquid wash to initiate swallow. Pharyngeal phase is remarkable for delayed swallow initiation at the pyriform sinuses  as well as reduced BOT retraction, epiglottic inversion and laryngeal closure resulting in penetration/aspiration and residue in the vallecula and pyriform sinuses. Mild aspiration occurred with thin liquids. Pt sensed aspiration producing a strong cough, but ultimately did not clear aspirate. Pt was very lethargic by  the end of the study, and very difficult to arouse, despite Max cues. Given pt's inability to maintain appropriate alert state for the study and as noted in previous sessions, recommend continuing NPO, with meds crushed in puree as the pt is alert/awake. SLP Visit Diagnosis Dysphagia, oropharyngeal phase (R13.12) Attention and concentration deficit following -- Frontal lobe and executive function deficit following -- Impact on safety and function Moderate aspiration risk   CHL IP TREATMENT RECOMMENDATION 06/11/2019 Treatment Recommendations Therapy as outlined in treatment plan below   Prognosis 06/11/2019 Prognosis for Safe Diet Advancement Fair Barriers to Reach Goals Language deficits;Cognitive deficits Barriers/Prognosis Comment -- CHL IP DIET RECOMMENDATION 06/11/2019 SLP Diet Recommendations NPO Liquid Administration via -- Medication Administration Crushed with puree Compensations -- Postural Changes --   CHL IP OTHER RECOMMENDATIONS 06/11/2019 Recommended Consults -- Oral Care Recommendations Oral care BID Other Recommendations Have oral suction available   CHL IP FOLLOW UP RECOMMENDATIONS 06/11/2019 Follow up Recommendations Skilled Nursing facility   [facility] IP FREQUENCY AND DURATION 06/11/2019 Speech Therapy Frequency (ACUTE ONLY) min 2x/week Treatment Duration 2 weeks      CHL IP ORAL PHASE 06/11/2019 Oral Phase Impaired Oral - Pudding Teaspoon -- Oral - Pudding Cup -- Oral - Honey Teaspoon -- Oral - Honey Cup -- Oral - Nectar Teaspoon Premature spillage;Decreased bolus cohesion;Delayed oral transit;Impaired mastication;Weak lingual manipulation;Lingual/palatal residue Oral - Nectar Cup -- Oral - Nectar Straw Delayed oral transit;Decreased bolus cohesion;Premature spillage;Weak lingual manipulation;Lingual/palatal residue Oral - Thin Teaspoon Decreased bolus cohesion;Premature spillage;Delayed oral transit;Lingual/palatal residue;Weak lingual manipulation Oral - Thin Cup -- Oral - Thin Straw Premature  spillage;Decreased bolus cohesion;Delayed oral transit;Lingual/palatal residue;Weak lingual manipulation Oral - Puree Delayed oral transit;Decreased bolus cohesion;Premature spillage;Lingual/palatal residue;Weak lingual manipulation;Impaired mastication;Holding of bolus Oral - Mech Soft -- Oral - Regular -- Oral - Multi-Consistency -- Oral - Pill -- Oral Phase - Comment --  CHL IP PHARYNGEAL PHASE 06/11/2019 Pharyngeal Phase Impaired Pharyngeal- Pudding Teaspoon -- Pharyngeal -- Pharyngeal- Pudding Cup -- Pharyngeal -- Pharyngeal- Honey Teaspoon -- Pharyngeal -- Pharyngeal- Honey Cup -- Pharyngeal -- Pharyngeal- Nectar Teaspoon Delayed swallow initiation-vallecula;Pharyngeal residue - pyriform Pharyngeal -- Pharyngeal- Nectar Cup -- Pharyngeal -- Pharyngeal- Nectar Straw Penetration/Aspiration during swallow;Reduced airway/laryngeal closure;Reduced epiglottic inversion;Pharyngeal residue - valleculae Pharyngeal Material enters airway, remains ABOVE vocal cords then ejected out Pharyngeal- Thin Teaspoon Delayed swallow initiation-vallecula;Pharyngeal residue - pyriform Pharyngeal -- Pharyngeal- Thin Cup -- Pharyngeal -- Pharyngeal- Thin Straw Pharyngeal residue - pyriform;Penetration/Aspiration during swallow;Reduced airway/laryngeal closure;Delayed swallow initiation-vallecula;Trace aspiration Pharyngeal Material enters airway, passes BELOW  cords and not ejected out despite cough attempt by patient Pharyngeal- Puree Penetration/Aspiration during swallow;Pharyngeal residue - valleculae;Reduced airway/laryngeal closure Pharyngeal Material enters airway, remains ABOVE vocal cords then ejected out Pharyngeal- Mechanical Soft -- Pharyngeal -- Pharyngeal- Regular -- Pharyngeal -- Pharyngeal- Multi-consistency -- Pharyngeal -- Pharyngeal- Pill -- Pharyngeal -- Pharyngeal Comment --  CHL IP CERVICAL ESOPHAGEAL PHASE 06/11/2019 Cervical Esophageal Phase WFL Pudding Teaspoon -- Pudding Cup -- Honey Teaspoon -- Honey Cup --  Nectar Teaspoon -- Nectar Cup -- Nectar Straw -- Thin Teaspoon -- Thin Cup -- Thin Straw -- Puree -- Mechanical Soft -- Regular -- Multi-consistency -- Pill -- Cervical Esophageal Comment -- Osie Bond., M.A. [place] [facility] Pager [phone] Office [phone] 06/11/2019, 11:37 AM               Scheduled Meds: . aspirin  325 mg Oral Daily  . atorvastatin  40 mg Oral q1800  . feeding supplement (PRO-STAT SUGAR FREE 64)  30 mL Per Tube Daily  . free water  140 mL Per Tube QID  . furosemide  40 mg Intravenous Daily  . heparin  5,000 Units Subcutaneous Q8H  . irbesartan  300 mg Per Tube q morning - 10a  . methimazole  5 mg Per Tube TID  . pantoprazole sodium  40 mg Per Tube Daily   Continuous Infusions: . feeding supplement (OSMOLITE 1.2 CAL) 1,000 mL (06/11/19 1755)     LOS: 4 days   Marylu Lund, MD [facility] Pager On Amion  If 7PM-7AM, please contact night-coverage 06/12/2019, 2:27 PM

## 2019-06-12 NOTE — Progress Notes (Signed)
Tiffany (grand dauughter) updated this shift.

## 2019-06-13 DIAGNOSIS — E0590 Thyrotoxicosis, unspecified without thyrotoxic crisis or storm: Secondary | ICD-10-CM

## 2019-06-13 DIAGNOSIS — I63511 Cerebral infarction due to unspecified occlusion or stenosis of right middle cerebral artery: Secondary | ICD-10-CM

## 2019-06-13 LAB — COMPREHENSIVE METABOLIC PANEL
ALT: 18 U/L (ref 0–44)
AST: 25 U/L (ref 15–41)
Albumin: 3.1 g/dL — ABNORMAL LOW (ref 3.5–5.0)
Alkaline Phosphatase: 80 U/L (ref 38–126)
Anion gap: 12 (ref 5–15)
BUN: 20 mg/dL (ref 8–23)
CO2: 24 mmol/L (ref 22–32)
Calcium: 8.9 mg/dL (ref 8.9–10.3)
Chloride: 99 mmol/L (ref 98–111)
Creatinine, Ser: 0.77 mg/dL (ref 0.44–1.00)
GFR calc Af Amer: >60 mL/min (ref >60)
GFR calc non Af Amer: >60 mL/min (ref >60)
Glucose, Bld: 139 mg/dL — ABNORMAL HIGH (ref 70–99)
Potassium: 3.2 mmol/L — ABNORMAL LOW (ref 3.5–5.1)
Sodium: 135 mmol/L (ref 135–145)
Total Bilirubin: 0.4 mg/dL (ref 0.3–1.2)
Total Protein: 5.9 g/dL — ABNORMAL LOW (ref 6.5–8.1)

## 2019-06-13 LAB — GLUCOSE, CAPILLARY
Glucose-Capillary: 150 mg/dL — ABNORMAL HIGH (ref 70–99)
Glucose-Capillary: 123 mg/dL — ABNORMAL HIGH (ref 70–99)
Glucose-Capillary: 133 mg/dL — ABNORMAL HIGH (ref 70–99)
Glucose-Capillary: 152 mg/dL — ABNORMAL HIGH (ref 70–99)
Glucose-Capillary: 152 mg/dL — ABNORMAL HIGH (ref 70–99)
Glucose-Capillary: 151 mg/dL — ABNORMAL HIGH (ref 70–99)

## 2019-06-13 LAB — CBC
HCT: 35.1 % — ABNORMAL LOW (ref 36.0–46.0)
Hemoglobin: 11.5 g/dL — ABNORMAL LOW (ref 12.0–15.0)
MCH: 27.1 pg (ref 26.0–34.0)
MCHC: 32.8 g/dL (ref 30.0–36.0)
MCV: 82.8 fL (ref 80.0–100.0)
Platelets: 395 10*3/uL (ref 150–400)
RBC: 4.24 MIL/uL (ref 3.87–5.11)
RDW: 21.5 % — ABNORMAL HIGH (ref 11.5–15.5)
WBC: 12.3 10*3/uL — ABNORMAL HIGH (ref 4.0–10.5)
nRBC: 0 % (ref 0.0–0.2)

## 2019-06-13 MED ORDER — AMLODIPINE BESYLATE 10 MG PO TABS
10.0000 mg | ORAL_TABLET | Freq: Every morning | ORAL | Status: DC
  Filled 2019-06-13: qty 1

## 2019-06-13 MED ORDER — POTASSIUM CHLORIDE 20 MEQ/15ML (10%) PO SOLN: 40.0000 meq | Freq: Once | ORAL | Status: DC

## 2019-06-13 MED ORDER — POTASSIUM CHLORIDE CRYS ER 20 MEQ PO TBCR
40.0000 meq | EXTENDED_RELEASE_TABLET | Freq: Once | ORAL | Status: DC
  Filled 2019-06-13: qty 2

## 2019-06-13 MED ORDER — CHLORHEXIDINE GLUCONATE CLOTH 2 % EX PADS
6.0000 | MEDICATED_PAD | Freq: Every day | CUTANEOUS | Status: DC
  Administered 2019-06-13 – 2019-06-18 (×5): 6 via TOPICAL

## 2019-06-13 MED ORDER — ATORVASTATIN CALCIUM 40 MG PO TABS
40.0000 mg | ORAL_TABLET | Freq: Every day | ORAL | Status: DC
  Administered 2019-06-14 – 2019-06-17 (×4): 40 mg
  Filled 2019-06-13 (×4): qty 1

## 2019-06-13 MED ORDER — AMLODIPINE BESYLATE 10 MG PO TABS
10.0000 mg | ORAL_TABLET | Freq: Every morning | ORAL | Status: DC
  Administered 2019-06-13 – 2019-06-18 (×6): 10 mg
  Filled 2019-06-13 (×5): qty 1

## 2019-06-13 MED ORDER — POTASSIUM CHLORIDE 20 MEQ/15ML (10%) PO SOLN
40.0000 meq | Freq: Once | ORAL | Status: AC
  Administered 2019-06-13: 40 meq
  Filled 2019-06-13: qty 30

## 2019-06-13 MED ORDER — ASPIRIN 325 MG PO TABS
325.0000 mg | ORAL_TABLET | Freq: Every day | ORAL | Status: DC
  Administered 2019-06-14 – 2019-06-15 (×2): 325 mg
  Filled 2019-06-13 (×2): qty 1

## 2019-06-13 NOTE — Progress Notes (Signed)
Physical Therapy Treatment Patient Details Name: [name] MRN: [id number] DOB: 1925/10/15 Today's Date: 06/13/2019    History of Present Illness 86 y.o. female with PMH of hypertension, hyperlipidemia, pacer for bradycardia, afib currently not on AC and recent left occipital ICH 3 weeks ago presented from SNF to ER for acute onset right gaze, left facial droop, slurred speech, right hemiplegia. LSW 8:30 am. CT showed evolving left ICH stable with the surrounding cerebral edema. CT head and neck showed right superior branch of M2 decreased caliber with intermittent stenosis concerning for nonocclusive clot comparing with her CTA 3 weeks ago.    PT Comments    Pt tolerated treatment well, continues to have significant LLE weakness and impaired mobility. Pt requiring physical assistance to perform all mobility at this time due to weakness. Pt citing L groin pain which also limits mobility at this time. Pt with motor planning vs weakness impairing gait at this time, requiring Pt weight shift to initiate steps with LLE. Pt will continue to benefit from acute PT POC to improve mobility and reduce falls risk.   Follow Up Recommendations  SNF;Supervision/Assistance - 24 hour     Equipment Recommendations  (defer to post-acute setting)    Recommendations for Other Services       Precautions / Restrictions Precautions Precautions: Fall Precaution Comments: Right side weakness - 3 weeks ago.  Now left side weakness Restrictions Weight Bearing Restrictions: No    Mobility  Bed Mobility Overal bed mobility: Needs Assistance Bed Mobility: Supine to Sit     Supine to sit: Mod assist;+2 for physical assistance        Transfers Overall transfer level: Needs assistance   Transfers: Sit to/from Stand Sit to Stand: +2 physical assistance;Min assist            Ambulation/Gait Ambulation/Gait assistance: Min assist;+2 physical assistance Gait Distance (Feet): 4 Feet Assistive  device: Rolling walker (2 wheeled) Gait Pattern/deviations: Shuffle Gait velocity: reduced Gait velocity interpretation: <1.8 ft/sec, indicate of risk for recurrent falls General Gait Details: pt with short shuffling steps turning from bed to [facility] and back, then bed to recliner   Stairs             Wheelchair Mobility    Modified Rankin (Stroke Patients Only) Modified Rankin (Stroke Patients Only) Pre-Morbid Rankin Score: Moderately severe disability Modified Rankin: Moderately severe disability     Balance Overall balance assessment: Needs assistance Sitting-balance support: Bilateral upper extremity supported;Feet supported Sitting balance-Leahy Scale: Fair Sitting balance - Comments: minG-minA with BUE support of bed Postural control: Posterior lean Standing balance support: Bilateral upper extremity supported Standing balance-Leahy Scale: Fair Standing balance comment: minA with BUE support of RW                            Cognition Arousal/Alertness: Awake/alert Behavior During Therapy: WFL for tasks assessed/performed Overall Cognitive Status: Impaired/Different from baseline Area of Impairment: Memory;Following commands;Safety/judgement;Awareness;Problem solving                     Memory: Decreased recall of precautions;Decreased short-term memory Following Commands: Follows one step commands consistently;Follows multi-step commands with increased time Safety/Judgement: Decreased awareness of safety;Decreased awareness of deficits   Problem Solving: Slow processing;Requires verbal cues General Comments: hard of hearing      Exercises      General Comments General comments (skin integrity, edema, etc.): pt's son present, VSS on RA  Pertinent Vitals/Pain Pain Assessment: 0-10 Pain Score: 10-Worst pain ever Pain Location: L groin Pain Descriptors / Indicators: Aching Pain Intervention(s): Limited activity within patient's  tolerance    Home Living                      Prior Function            PT Goals (current goals can now be found in the care plan section) Acute Rehab PT Goals Patient Stated Goal: To improve mobility Progress towards PT goals: Progressing toward goals    Frequency    Min 3X/week      PT Plan Current plan remains appropriate    Co-evaluation              AM-PAC PT "6 Clicks" Mobility   Outcome Measure  Help needed turning from your back to your side while in a flat bed without using bedrails?: A Little Help needed moving from lying on your back to sitting on the side of a flat bed without using bedrails?: A Lot Help needed moving to and from a bed to a chair (including a wheelchair)?: A Lot Help needed standing up from a chair using your arms (e.g., wheelchair or bedside chair)?: A Lot Help needed to walk in hospital room?: A Lot Help needed climbing 3-5 steps with a railing? : Total 6 Click Score: 12    End of Session   Activity Tolerance: Patient tolerated treatment well Patient left: in chair;with call bell/phone within reach;with chair alarm set Nurse Communication: Mobility status PT Visit Diagnosis: Other abnormalities of gait and mobility (R26.89);History of falling (Z91.81);Muscle weakness (generalized) (M62.81);Difficulty in walking, not elsewhere classified (R26.2);Hemiplegia and hemiparesis Hemiplegia - Right/Left: Left Hemiplegia - dominant/non-dominant: Non-dominant Hemiplegia - caused by: Cerebral infarction     Time: [id number] PT Time Calculation (min) (ACUTE ONLY): 27 min  Charges:  $Therapeutic Activity: 23-37 mins                     Zenaida Niece, PT, DPT Acute Rehabilitation Pager: [phone]    Zenaida Niece 06/13/2019, 12:41 PM

## 2019-06-13 NOTE — Progress Notes (Signed)
PROGRESS NOTE  [name] [id number] DOB: 09-11-1925 DOA: 06/08/2019 PCP: [name], MD  Brief History   86 y.o.femalewith medical history significant ofhypertension, hyperlipidemia,PPM, afiboff anticoagulationsand recent left occipital ICH 3 weeks ago presented from SNF to ER for new onset of slurred speech and right sided weakness. Pt was last see at her baseline about 8 AM this morning, then pt was found to have right sided gaze,left facial droop, slurred speech,lefthemiplegia. CODE STROKE triggered in ED. Pt was mumbling, hard to understand her talking, but she denied any pain, no SOB, she pointed to her throat and saying she can hardly clear her throat. ED Course:CT showed evolving left ICH stable with the surrounding cerebral edema. No other ICH but right insular cortex ischemic attenuation. CT head and neck showed right superior branch of M2 decreased caliber with intermittent stenosis concerning fornonocclusive clot comparing with her CTA 3 weeks prior to admit.  Plan is for discharge to SNF. Recommendation from neurology is to place the patient on a heparin drip in the am of 06/14/2019 and monitor her for bleed for 48 hours. If she has had no signs of bleeding, she may be started on Eliquis and discharged to SNF.  Consultants  . Neurology  Antibiotics   Anti-infectives (From admission, onward)   None    .  Subjective  The patient is sitting up at bedside. No new complaints.  Objective   Vitals:  Vitals:   06/13/19 1224 06/13/19 1618  BP: (!) 169/81 (!) 150/88  Pulse: 81 70  Resp: 18 18  Temp: 97.7 F (36.5 C) 98.7 F (37.1 C)  SpO2: 100% 98%   Exam:  Constitutional:  . The patient is awake, alert, and oriented x 3. No acute distress. Respiratory:  . No increased work of breathing. . No wheezes, rales, or rhonchi . No tactile fremitus Cardiovascular:  . Regular rate and rhythm . No murmurs, ectopy, or gallups. . No lateral PMI. No  thrills. Abdomen:  . Abdomen is soft, non-tender, non-distended . No hernias, masses, or organomegaly . Normoactive bowel sounds.  Musculoskeletal:  . No cyanosis, clubbing, or edema Skin:  . No rashes, lesions, ulcers . palpation of skin: no induration or nodules Neurologic:  . CN 2-12 intact . Sensation all 4 extremities intact Psychiatric:  . Mental status o Mood, affect appropriate o Orientation to person, place, time  . judgment and insight appear intact  I have personally reviewed the following:   Today's Data  . Vitals, CMP, CBC  Scheduled Meds: . amLODipine  10 mg Per Tube q morning - 10a  . aspirin  325 mg Oral Daily  . atorvastatin  40 mg Oral q1800  . Chlorhexidine Gluconate Cloth  6 each Topical Q0600  . feeding supplement (PRO-STAT SUGAR FREE 64)  30 mL Per Tube Daily  . free water  140 mL Per Tube QID  . furosemide  40 mg Intravenous Daily  . heparin  5,000 Units Subcutaneous Q8H  . irbesartan  300 mg Per Tube q morning - 10a  . methimazole  5 mg Per Tube TID  . pantoprazole sodium  40 mg Per Tube Daily   Continuous Infusions: . feeding supplement (OSMOLITE 1.2 CAL) 1,000 mL (06/11/19 1755)    Active Problems:   Cerebral embolism with cerebral infarction   Cerebral infarction ([place])   LOS: 5 days   A & P  R MCA stroke: complicating this event is the patient's recent [place] prior to admit. She is  also unable to undergo MRI given pacer. However, evolving CVA noted on CT head. Neurology was consulted and the patient has been admitted to a telemetry bed. She is receiving ASA.  Permissive hypertension has been practiced. The patient has been evaluated by PT/OT. Recommendation is for SNF. I have discussed this patient with Dr. Erlinda Hong. He recommends heparin drip in the am of 06/14/2019 and monitor her for bleed for 48 hours. If she has had no signs of bleeding, she may be started on Eliquis and discharged to SNF.  Essential HTN: BP a little high today. Will increase  antihypertensives.   Hyperthyroid: Pt is on methimazole. Stable.  Chronic Afib: Rate is controlled. Will restart Eliquis if no signs of bleeding after 48 hours on heparin drip. Hemodynamically stable at this time.  Hx chronic diastolic chf: Euvolemic at this time. CXR demonstrates no pulonary edema or pulmonary vascular congestion. LVEF normal on 2d echo, reviewed. Will resume home dosage of lasix. Monitor volume status.  I have seen and examined this patient myself. I have spent 34 minutes in her evaluation and care.  DVT prophylaxis: Heparin subq Code Status: DNR Family Communication: Pt in room, pt's family at bedside Disposition Plan: From SNF, anticipate d/c SNF when cleared by neurology and when off heparin gtt, on NOAC.   Azalee Weimer, DO [facility] Direct contact: see [email or web address]  7PM-7AM contact night coverage as above 06/13/2019, 6:09 PM  LOS: 5 days

## 2019-06-13 NOTE — Progress Notes (Signed)
  Speech Language Pathology Treatment: Dysphagia  Patient Details Name: SHAYDA CURTSINGER MRN: [id number] DOB: 25-Feb-1926 Today's Date: 06/13/2019 Time: [id number] SLP Time Calculation (min) (ACUTE ONLY): 20 min  Assessment / Plan / Recommendation Clinical Impression  Pt easily arousable this am, stayed awake throughout session, initiating conversation, following all commands. Pt continues to demonstrates concern for dysphagia with small trials of ice and puree, but endurance appears improved. Anticipate improved function given arousal. Will proceed with repeat MBS tomorrow for potential diet advancement.   HPI HPI: SHERIANNE BIELAWSKI is a 86 y.o. female with medical history significant of hypertension, hyperlipidemia, PPM, afib off anticoagulations and recent left occipital ICH 3 weeks ago presented from SNF to ER for new onset of slurred speech and right sided weakness. Head CT on 3/13 reported "Evolving acute right MCA territory infarct. No associated hemorrhage or significant mass effect." CXR was negative for acute abnormality.  Pt has baseline aphasia from previous CVA last month (04/2019).        SLP Plan  MBS       Recommendations  Diet recommendations: NPO                Oral Care Recommendations: Oral care QID Follow up Recommendations: Skilled Nursing facility SLP Visit Diagnosis: Dysphagia, oropharyngeal phase (R13.12) Plan: MBS       GO               Herbie Baltimore, MA CCC-SLP  Acute Rehabilitation Services Pager [phone] Office [phone]  Lynann Beaver 06/13/2019, 1:34 PM

## 2019-06-13 NOTE — Progress Notes (Signed)
STROKE TEAM PROGRESS NOTE   INTERVAL HISTORY Patient sitting in chair, son at bedside.  Patient more awake alert, interactive than yesterday.  Still has right gaze preference but able to fully gaze to the left.  Still has left hemiparesis and dysarthria.  On tube feeding with core track.  Speech is going to do the barium study tomorrow.  OBJECTIVE Vitals:   06/13/19 0454 06/13/19 0457 06/13/19 0819 06/13/19 1224  BP:  (!) 165/77 (!) 171/81 (!) 169/81  Pulse:  63 61 81  Resp:  18  18  Temp:  99 F (37.2 C) 98.2 F (36.8 C) 97.7 F (36.5 C)  TempSrc:  Oral Oral Oral  SpO2:  97% 97% 100%  Weight: 66.2 kg      CBC:  Recent Labs  Lab 06/08/19 1054 06/08/19 1058 06/11/19 0430 06/13/19 0345  WBC 9.0   < > 11.0* 12.3*  NEUTROABS 7.1  --   --   --   HGB 12.3   < > 12.0 11.5*  HCT 39.0   < > 37.2 35.1*  MCV 86.5   < > 83.2 82.8  PLT 427*   < > 380 395   < > = values in this interval not displayed.   Basic Metabolic Panel:  Recent Labs  Lab 06/11/19 0430 06/13/19 0345  NA 135 135  K 3.5 3.2*  CL 101 99  CO2 19* 24  GLUCOSE 87 139*  BUN 11 20  CREATININE 0.83 0.77  CALCIUM 9.0 8.9   Lipid Panel:     Component Value Date/Time   CHOL 201 (H) 06/09/2019 0444   TRIG 79 06/09/2019 0444   HDL 52 06/09/2019 0444   CHOLHDL 3.9 06/09/2019 0444   VLDL 16 06/09/2019 0444   LDLCALC 133 (H) 06/09/2019 0444   HgbA1c:  Lab Results  Component Value Date   HGBA1C 5.4 06/09/2019   Urine Drug Screen:     Component Value Date/Time   LABOPIA NONE DETECTED 06/08/2019 1932   COCAINSCRNUR NONE DETECTED 06/08/2019 1932   LABBENZ NONE DETECTED 06/08/2019 1932   AMPHETMU NONE DETECTED 06/08/2019 1932   THCU NONE DETECTED 06/08/2019 1932   LABBARB NONE DETECTED 06/08/2019 1932    Alcohol Level     Component Value Date/Time   ETH <10 06/08/2019 1054    IMAGING CT HEAD CODE STROKE WO CONTRAST 06/08/2019 IMPRESSION:  1. New hypodensity involving the right insula, suggesting  acute infarct.  2. Redemonstrated is subacute bleeding in the left occipital lobe with surrounding vasogenic edema. Resolution of the extension of the bleed into the occipital horn of the left lateral ventricle. No new hemorrhage.  3. ASPECTS is 9   CT ANGIO HEAD W OR WO CONTRAST CT ANGIO NECK W OR WO CONTRAST 06/08/2019 IMPRESSION:  Short segment of attenuation of the distal right M1/MCA at the level of the bifurcation with non opacification of the origin of the superior division branch. Comparison with the prior CT angiogram also demonstrate missing superior division branches. Aortic Atherosclerosis (ICD10-I70.0).   DG CHEST PORT 1 VIEW 06/08/2019 IMPRESSION:  No acute abnormality noted.   CT HEAD WO CONTRAST 06/09/2019 IMPRESSION:  1. Evolving acute right MCA territory infarct. No associated hemorrhage or significant mass effect.  2. No significant interval change in size of subacute left occipital hemorrhage with similar surrounding edema.  3. No other new acute intracranial abnormality.  CT Head WO Contrast 06/11/2019 Evolving right MCA territory infarction. Similar appearance of left occipital hemorrhage and surrounding edema.  No new hemorrhage or worsening mass effect.   Transthoracic Echocardiogram  05/20/2019 IMPRESSIONS  1. Left ventricular ejection fraction, by estimation, is 60 to 65%. The left ventricle has normal function. The left ventricle has no regional wall motion abnormalities. Left ventricular diastolic parameters are indeterminate in the setting of atrial fibrillation.  2. Right ventricular systolic function is normal. The right ventricular size is normal. There is moderately elevated pulmonary artery systolic pressure. The estimated right ventricular systolic pressure is 123XX123 mmHg.  3. Left atrial size was severely dilated.  4. Right atrial size was moderately dilated.  5. The mitral valve is degenerative. Trivial mitral valve regurgitation.  6. The aortic  valve is tricuspid. Aortic valve regurgitation is not visualized.  7. The inferior vena cava is dilated in size with >50% respiratory variability, suggesting right atrial pressure of 8 mmHg.   CXR 06/12/2019 No acute cardiopulmonary disease. Mild left basilar atelectasis.  ECG - atrial fibrillation - ventricular response 66 BPM (See cardiology reading for complete details)   PHYSICAL EXAM  Blood pressure (!) 169/81, pulse 81, temperature 97.7 F (36.5 C), temperature source Oral, resp. rate 18, weight 66.2 kg, SpO2 100 %.  General - well nourished, well developed, mildly lethargic.    Ophthalmologic - fundi not visualized due to noncooperation.    Cardiovascular - irregularly irregular heart rate and rhythm  Neuro - mildly lethargic, eyes open, moderate dysarthria, orientated to time, place, and people.  No aphasia, following all simple commands, however, paucity of speech, able to repeat simple sentences with moderate dysarthria.  Right gaze preference, but able to have complete left gaze.  Not blinking to visual threat on the left, blinking to visual threat on the right.  Left facial droop.  Tongue midline.  Left upper extremity 4/5 proximal but 3/5 finger grip and 2/5 wrist extension.  Left lower extremity 3/5. RUE and RLE 4/5 at least. DTR 1+, no Babinski. Sensation, coordination not corporative and gait not tested.   ASSESSMENT/PLAN [name] is a 86 y.o. female with history of hypertension, hyperlipidemia, pacer for bradycardia, afib currently not on AC and recent left occipital ICH 3 weeks ago presenting with right gaze, left facial droop, slurred speech, and right hemiplegia.    She did not receive IV t-PA due to recent ICH.  Stroke:  Rt MCA infarct - embolic - likely due to atrial fibrillation not on anticoagulation due to recent [place]  CT Head - New hypodensity involving the R insula. Redemonstrated is subacute bleeding in the L occipital lobe with surrounding  vasogenic edema. Resolution of occipital horn blood. No new hemorrhage. ASPECTS is 9   CT head - Evolving acute R MCA territory infarct. ICH stable.   CTA H&N - Short segment of attenuation of the distal right M1/MCA at the level of the bifurcation with non opacification of the origin of the superior division branch. Comparison with the prior CT angiogram also demonstrate missing superior division branches.   CT Head repeat - 06/11/19 - evolving R large MCA infarct. L occipital ICH w/ edema unchanged.  2D Echo - 05/20/19 - EF 60 - 65%. Atrial fibrillation  [employer] Virus 2 - negative  LDL - 133  HgbA1c - 5.4  VTE prophylaxis - Cetronia Heparin  No antithrombotic prior to admission, now on aspirin 300 mg suppository daily. Will consider IV heparin tomorrow, if tolerating well for 24-48 hours, will transition to eliquis. ASA can be discontinued once heparin IV started.  Therapy recommendations:  SNF  Disposition:  Pending  Chronic Atrial Fibrillation  Home anticoagulation:  none given recent hemorrhage   No AC at this time given large R MCA infarct and recent ICH.  Will consider IV heparin tomorrow, if tolerating well for 24-48 hours, will transition to eliquis.   Hx of ICH  05/19/2019 - left occipital lobe ICH in the setting of full dose lovenox for atrial fibrillation and hypertension while awaiting cervical injection.   Reversed with protamine, treated with BP management and hold off AC.    Discharge to SNF   Hypertension  Home BP meds: Norvasc ; Apresoline ; Avapro  Current BP meds: Apresoline prn  Off IVF . SBP goal 130 - 160 for now . Long-term BP goal normotensive  Hyperlipidemia  Home Lipid lowering medication: none   LDL 133, goal < 70  Current lipid lowering medication: lipitor 40   Continue statin on discharge  Dysphagia . Secondary to stroke . Failed MBSS . NPO . cortrak placed, On TF . Speech on board  Hx Chronic diastolic CHF  Intermittent  expirational wheezing  D/c IVF  On lasix PTA, added back IV  Leukocytosis WBC 8.1->11.0->12.3  CXR NAD, mild L basilar atx  Put on DuoNeb nebulization  Other Stroke Risk Factors  Advanced age  Former cigarette smoker - quit  Other Active Problems  Code status - DNR  Aortic Atherosclerosis (ICD10-I70.0)  Mild anemia - Hb 11.6-12 - resolved  Hyponatremia, chronic - 130->128->132->135->135   Hyperthryoid  Hospital day # 5  I had long discussion with son at bedside, updated pt current condition, treatment plan and potential prognosis, and answered all the questions.  He expressed understanding and appreciation.   Neurology will sign off. Please call with questions. Pt will follow up with Dr. Leonie Man on 07/17/19 as scheduled. Thanks for the consult.  Rosalin Hawking, MD PhD Stroke Neurology 06/13/2019 2:20 PM     To contact Stroke Continuity provider, please refer to [email or web address] After hours, contact General Neurology

## 2019-06-13 NOTE — Plan of Care (Signed)
  Problem: Education: Goal: Knowledge of disease or condition will improve Outcome: Progressing Goal: Knowledge of secondary prevention will improve Outcome: Progressing Goal: Knowledge of patient specific risk factors addressed and post discharge goals established will improve Outcome: Progressing Goal: Individualized Educational Video(s) Outcome: Progressing   Problem: Coping: Goal: Will verbalize positive feelings about self Outcome: Progressing Goal: Will identify appropriate support needs Outcome: Progressing   Problem: Health Behavior/Discharge Planning: Goal: Ability to manage health-related needs will improve Outcome: Progressing   Problem: Self-Care: Goal: Ability to participate in self-care as condition permits will improve Outcome: Progressing Goal: Verbalization of feelings and concerns over difficulty with self-care will improve Outcome: Progressing Goal: Ability to communicate needs accurately will improve Outcome: Progressing   Problem: Nutrition: Goal: Risk of aspiration will decrease Outcome: Progressing Goal: Dietary intake will improve Outcome: Progressing   Problem: Ischemic Stroke/TIA Tissue Perfusion: Goal: Complications of ischemic stroke/TIA will be minimized Outcome: Progressing   Problem: Education: Goal: Knowledge of General Education information will improve Description: Including pain rating scale, medication(s)/side effects and non-pharmacologic comfort measures Outcome: Progressing   Problem: Health Behavior/Discharge Planning: Goal: Ability to manage health-related needs will improve Outcome: Progressing   Problem: Clinical Measurements: Goal: Ability to maintain clinical measurements within normal limits will improve Outcome: Progressing Goal: Will remain free from infection Outcome: Progressing Goal: Diagnostic test results will improve Outcome: Progressing Goal: Respiratory complications will improve Outcome: Progressing Goal:  Cardiovascular complication will be avoided Outcome: Progressing   Problem: Activity: Goal: Risk for activity intolerance will decrease Outcome: Progressing   Problem: Nutrition: Goal: Adequate nutrition will be maintained Outcome: Progressing   Problem: Coping: Goal: Level of anxiety will decrease Outcome: Progressing   Problem: Elimination: Goal: Will not experience complications related to bowel motility Outcome: Progressing Goal: Will not experience complications related to urinary retention Outcome: Progressing   Problem: Pain Managment: Goal: General experience of comfort will improve Outcome: Progressing   Problem: Skin Integrity: Goal: Risk for impaired skin integrity will decrease Outcome: Progressing

## 2019-06-13 NOTE — Evaluation (Signed)
Speech Language Pathology Evaluation Patient Details Name: [name] MRN: [id number] DOB: [birth year] Today's Date: 06/13/2019 Time: [id number] SLP Time Calculation (min) (ACUTE ONLY): 20 min  Problem List:  Patient Active Problem List   Diagnosis Date Noted  . Cerebral embolism with cerebral infarction 06/08/2019  . Cerebral infarction ([place]) 06/08/2019  . Hyperlipidemia 05/24/2019  . Abdominal pain 05/21/2019  . ICH (intracerebral hemorrhage) ([place]) 05/19/2019  . V tach ([place]) 08/09/2018  . Intractable nausea and vomiting 08/09/2018  . Hyponatremia 08/09/2018  . Hypomagnesemia 08/09/2018  . Hyperthyroidism 08/09/2018  . Epigastric pain   . Nonspecific chest pain 07/18/2018  . Anxiety 07/18/2018  . Insomnia 07/18/2018  . Complication of anesthesia   . Intractable headache 01/06/2017  . Intractable pain 01/04/2017  . Chronic anticoagulation 01/03/2017  . Neck pain 01/03/2017  . Acute bronchitis 06/08/2016  . Fall 09/24/2015  . Dizziness 09/24/2015  . Postoperative anemia due to acute blood loss 12/27/2013  . Postop Transfusion 12/27/2013  . OA (osteoarthritis) of hip 12/24/2013  . Preop cardiovascular exam 09/25/2013  . Cardiac pacemaker in situ 08/29/2008  . Essential hypertension 08/21/2008  . ATRIAL FIBRILLATION 08/21/2008  . Bradycardia 08/21/2008  . PNEUMONIA 08/21/2008  . GERD 08/21/2008  . NEPHROLITHIASIS 08/21/2008  . Osteoarthritis 08/21/2008   Past Medical History:  Past Medical History:  Diagnosis Date  . Anxiety   . Atrial fibrillation ([place])   . Bradycardia   . Cancer ([place])    skin  . Cataract    removed bilat with lens both eyes   . Complication of anesthesia   . Difficulty sleeping   . Dysrhythmia    A FIB  . GERD (gastroesophageal reflux disease)   . H/O hiatal hernia   . History of kidney stones   . History of skin cancer   . History of transfusion   . Hyperlipidemia   . Hypertension   . Osteoarthritis   . Pacemaker   . Pneumonia    . PONV (postoperative nausea and vomiting)   . Shortness of breath    WITH EXERTION   Past Surgical History:  Past Surgical History:  Procedure Laterality Date  . APPENDECTOMY  1960  . ARTHROPLASTY  1994   Left total knee  . ARTHROPLASTY  2000   Total right knee  . BACK SURGERY    . CATARACT EXTRACTION, BILATERAL     with lens implants  . CHOLECYSTECTOMY    . COLONOSCOPY    . DG SELECTED HSG GDC ONLY  2002   Dilation  . Microdiskectomy  05/2004   Left L4-L5  . PACEMAKER INSERTION    . POLYPECTOMY    . TOTAL ABDOMINAL HYSTERECTOMY  1969  . TOTAL HIP ARTHROPLASTY Right 12/24/2013   Procedure: RIGHT TOTAL HIP ARTHROPLASTY ANTERIOR APPROACH;  Surgeon: Gearlean Alf, MD;  Location: [facility];  Service: Orthopedics;  Laterality: Right;  . UPPER GASTROINTESTINAL ENDOSCOPY     HPI:  [name] is a 86 y.o. female with medical history significant of hypertension, hyperlipidemia, PPM, afib off anticoagulations and recent left occipital ICH 3 weeks ago presented from SNF to ER for new onset of slurred speech and right sided weakness. Head CT on 3/13 reported "Evolving acute right MCA territory infarct. No associated hemorrhage or significant mass effect." CXR was negative for acute abnormality.  Pt has baseline aphasia from previous CVA last month (04/2019).     Assessment / Plan / Recommendation Clinical Impression   Pt demonstrates relatively good cognitive linguistic  function, comprehension and expressive language in tact. Pt demonstrates good verbal cognitive skills including recent memory and awareness. She is more alert today than in other sessions. Primary problem to be addressed this admission is dysarthria, arousal, compensatory strategies and safety awareness. Will proceed with interventions 2x a week. Recommend SNF at d/c.      SLP Assessment  SLP Visit Diagnosis: Dysphagia, oropharyngeal phase (R13.12)    Follow Up Recommendations  Skilled Nursing facility     Frequency and Duration min 2x/week  2 weeks      SLP Evaluation Cognition  Overall Cognitive Status: Impaired/Different from baseline Arousal/Alertness: Awake/alert Orientation Level: Oriented X4 Attention: Sustained Sustained Attention: Appears intact Memory: Appears intact Awareness: Appears intact       Comprehension  Auditory Comprehension Overall Auditory Comprehension: Appears within functional limits for tasks assessed    Expression Verbal Expression Overall Verbal Expression: Appears within functional limits for tasks assessed   Oral / Motor  Oral Motor/Sensory Function Overall Oral Motor/Sensory Function: Mild impairment Facial ROM: Reduced left Facial Symmetry: Abnormal symmetry left Facial Strength: Reduced left;Suspected CN VII (facial) dysfunction Facial Sensation: Reduced left Lingual ROM: Reduced right;Reduced left Lingual Symmetry: Abnormal symmetry left Lingual Strength: Reduced Motor Speech Overall Motor Speech: Impaired Respiration: Within functional limits Phonation: Normal Resonance: Within functional limits Articulation: Impaired Level of Impairment: Word Intelligibility: Intelligible Motor Planning: Witnin functional limits Motor Speech Errors: Aware   GO                   Herbie Baltimore, MA CCC-SLP  Acute Rehabilitation Services Pager [phone] Office [phone]  Lynann Beaver 06/13/2019, 1:42 PM

## 2019-06-13 NOTE — Progress Notes (Signed)
Potassium 3.2. kirby updated

## 2019-06-14 ENCOUNTER — Inpatient Hospital Stay (HOSPITAL_COMMUNITY): Payer: Medicare Other

## 2019-06-14 LAB — COMPREHENSIVE METABOLIC PANEL
ALT: 18 U/L (ref 0–44)
AST: 23 U/L (ref 15–41)
Albumin: 2.9 g/dL — ABNORMAL LOW (ref 3.5–5.0)
Alkaline Phosphatase: 83 U/L (ref 38–126)
Anion gap: 12 (ref 5–15)
BUN: 23 mg/dL (ref 8–23)
CO2: 28 mmol/L (ref 22–32)
Calcium: 9.1 mg/dL (ref 8.9–10.3)
Chloride: 96 mmol/L — ABNORMAL LOW (ref 98–111)
Creatinine, Ser: 0.78 mg/dL (ref 0.44–1.00)
GFR calc Af Amer: >60 mL/min (ref >60)
GFR calc non Af Amer: >60 mL/min (ref >60)
Glucose, Bld: 150 mg/dL — ABNORMAL HIGH (ref 70–99)
Potassium: 3.3 mmol/L — ABNORMAL LOW (ref 3.5–5.1)
Sodium: 136 mmol/L (ref 135–145)
Total Bilirubin: 0.8 mg/dL (ref 0.3–1.2)
Total Protein: 5.8 g/dL — ABNORMAL LOW (ref 6.5–8.1)

## 2019-06-14 LAB — GLUCOSE, CAPILLARY
Glucose-Capillary: 138 mg/dL — ABNORMAL HIGH (ref 70–99)
Glucose-Capillary: 144 mg/dL — ABNORMAL HIGH (ref 70–99)
Glucose-Capillary: 135 mg/dL — ABNORMAL HIGH (ref 70–99)
Glucose-Capillary: 153 mg/dL — ABNORMAL HIGH (ref 70–99)
Glucose-Capillary: 121 mg/dL — ABNORMAL HIGH (ref 70–99)
Glucose-Capillary: 135 mg/dL — ABNORMAL HIGH (ref 70–99)
Glucose-Capillary: 140 mg/dL — ABNORMAL HIGH (ref 70–99)

## 2019-06-14 LAB — CBC WITH DIFFERENTIAL/PLATELET
Abs Immature Granulocytes: 0.07 10*3/uL (ref 0.00–0.07)
Basophils Absolute: 0 10*3/uL (ref 0.0–0.1)
Basophils Relative: 0 %
Eosinophils Absolute: 0.1 10*3/uL (ref 0.0–0.5)
Eosinophils Relative: 1 %
HCT: 34.7 % — ABNORMAL LOW (ref 36.0–46.0)
Hemoglobin: 11.2 g/dL — ABNORMAL LOW (ref 12.0–15.0)
Immature Granulocytes: 1 %
Lymphocytes Relative: 7 %
Lymphs Abs: 0.9 10*3/uL (ref 0.7–4.0)
MCH: 27.1 pg (ref 26.0–34.0)
MCHC: 32.3 g/dL (ref 30.0–36.0)
MCV: 83.8 fL (ref 80.0–100.0)
Monocytes Absolute: 0.9 10*3/uL (ref 0.1–1.0)
Monocytes Relative: 7 %
Neutro Abs: 10.7 10*3/uL — ABNORMAL HIGH (ref 1.7–7.7)
Neutrophils Relative %: 84 %
Platelets: 394 10*3/uL (ref 150–400)
RBC: 4.14 MIL/uL (ref 3.87–5.11)
RDW: 22.1 % — ABNORMAL HIGH (ref 11.5–15.5)
WBC: 12.6 10*3/uL — ABNORMAL HIGH (ref 4.0–10.5)
nRBC: 0 % (ref 0.0–0.2)

## 2019-06-14 LAB — HEPARIN LEVEL (UNFRACTIONATED): Heparin Unfractionated: 1.02 IU/mL — ABNORMAL HIGH (ref 0.30–0.70)

## 2019-06-14 MED ORDER — HEPARIN (PORCINE) 25000 UT/250ML-% IV SOLN
550.0000 [IU]/h | INTRAVENOUS | Status: DC
  Administered 2019-06-14: 500 [IU]/h via INTRAVENOUS
  Administered 2019-06-16: 550 [IU]/h via INTRAVENOUS
  Filled 2019-06-14: qty 250

## 2019-06-14 MED ORDER — HEPARIN (PORCINE) 25000 UT/250ML-% IV SOLN
700.0000 [IU]/h | INTRAVENOUS | Status: DC
  Administered 2019-06-14: 700 [IU]/h via INTRAVENOUS
  Filled 2019-06-14: qty 250

## 2019-06-14 NOTE — Plan of Care (Signed)
  Problem: Coping: Goal: Will verbalize positive feelings about self Outcome: Progressing   Problem: Nutrition: Goal: Risk of aspiration will decrease Outcome: Progressing

## 2019-06-14 NOTE — Progress Notes (Signed)
PROGRESS NOTE  [name] [id number] DOB: 15-Jun-1925 DOA: 06/08/2019 PCP: [name], MD  Brief History   86 y.o.femalewith medical history significant ofhypertension, hyperlipidemia,PPM, afiboff anticoagulationsand recent left occipital ICH 3 weeks ago presented from SNF to ER for new onset of slurred speech and right sided weakness. Pt was last see at her baseline about 8 AM this morning, then pt was found to have right sided gaze,left facial droop, slurred speech,lefthemiplegia. CODE STROKE triggered in ED. Pt was mumbling, hard to understand her talking, but she denied any pain, no SOB, she pointed to her throat and saying she can hardly clear her throat. ED Course:CT showed evolving left ICH stable with the surrounding cerebral edema. No other ICH but right insular cortex ischemic attenuation. CT head and neck showed right superior branch of M2 decreased caliber with intermittent stenosis concerning fornonocclusive clot comparing with her CTA 3 weeks prior to admit.  Plan is for discharge to SNF. Recommendation from neurology is to place the patient on a heparin drip in the am of 06/14/2019 and monitor her for bleed for 48 hours. If she has had no signs of bleeding, she may be started on Eliquis and discharged to SNF.  Consultants  . Neurology  Antibiotics   Anti-infectives (From admission, onward)   None     Subjective  The patient is sitting up at bedside. No new complaints. Family at bedside.  Objective   Vitals:  Vitals:   06/14/19 0901 06/14/19 1211  BP: (!) 163/86 131/77  Pulse: 75 84  Resp: 14 16  Temp: 98.5 F (36.9 C) 98.7 F (37.1 C)  SpO2: 100% 100%   Exam:  Constitutional:  . The patient is awake, alert, and oriented x 3. No acute distress. Respiratory:  . No increased work of breathing. . No wheezes, rales, or rhonchi . No tactile fremitus Cardiovascular:  . Regular rate and rhythm . No murmurs, ectopy, or gallups. . No  lateral PMI. No thrills. Abdomen:  . Abdomen is soft, non-tender, non-distended . No hernias, masses, or organomegaly . Normoactive bowel sounds.  Musculoskeletal:  . No cyanosis, clubbing, or edema Skin:  . No rashes, lesions, ulcers . palpation of skin: no induration or nodules Neurologic:  . CN 2-12 intact . Sensation all 4 extremities intact Psychiatric:  . Mental status o Mood, affect appropriate o Orientation to person, place, time  . judgment and insight appear intact  I have personally reviewed the following:   Today's Data  . Vitals, CMP, CBC  Scheduled Meds: . amLODipine  10 mg Per Tube q morning - 10a  . aspirin  325 mg Per Tube Daily  . atorvastatin  40 mg Per Tube q1800  . Chlorhexidine Gluconate Cloth  6 each Topical Q0600  . feeding supplement (PRO-STAT SUGAR FREE 64)  30 mL Per Tube Daily  . free water  140 mL Per Tube QID  . furosemide  40 mg Intravenous Daily  . irbesartan  300 mg Per Tube q morning - 10a  . methimazole  5 mg Per Tube TID  . pantoprazole sodium  40 mg Per Tube Daily   Continuous Infusions: . feeding supplement (OSMOLITE 1.2 CAL) 1,000 mL (06/13/19 1909)  . heparin 700 Units/hr (06/14/19 1131)    Active Problems:   Cerebral embolism with cerebral infarction   Cerebral infarction ([place])   LOS: 6 days   A & P  R MCA stroke: complicating this event is the patient's recent [place] prior to admit.  She is also unable to undergo MRI given pacer. However, evolving CVA noted on CT head. Neurology was consulted and the patient has been admitted to a telemetry bed. She is receiving ASA.  Permissive hypertension has been practiced. The patient has been evaluated by PT/OT. Recommendation is for SNF. I have discussed this patient with Dr. Erlinda Hong. He recommends heparin drip in the am of 06/14/2019 and monitor her for bleed for 48 hours. If she has had no signs of bleeding, she may be started on Eliquis and discharged to SNF.  Essential HTN: BP a little high  today. Will increase antihypertensives.   Hypokalemia: Supplement and monitor.  Hyperthyroid: Pt is on methimazole. Stable.  Chronic Afib: Rate is controlled. Will restart Eliquis if no signs of bleeding after 48 hours on heparin drip. Hemodynamically stable at this time.  Hx chronic diastolic chf: Euvolemic at this time. CXR demonstrates no pulonary edema or pulmonary vascular congestion. LVEF normal on 2d echo, reviewed. Will resume home dosage of lasix. Monitor volume status.  I have seen and examined this patient myself. I have spent 32 minutes in her evaluation and care.  DVT prophylaxis: Heparin subq Code Status: DNR Family Communication: Pt in room, pt's family at bedside Disposition Plan: From SNF, anticipate d/c SNF when cleared by neurology and when off heparin gtt, on NOAC.   Denise Osterhout, DO [facility] Direct contact: see [email or web address]  7PM-7AM contact night coverage as above 06/14/2019, 4:18 PM  LOS: 5 days

## 2019-06-14 NOTE — Progress Notes (Signed)
ANTICOAGULATION CONSULT NOTE - Follow Up Consult  Pharmacy Consult for Heparin  Indication: stroke  Allergies  Allergen Reactions  . Morphine And Related Nausea And Vomiting    Patient Measurements: Weight: 146 lb (66.2 kg)  Vital Signs: Temp: 98.5 F (36.9 C) (03/18 0901) Temp Source: Oral (03/18 0901) BP: 163/86 (03/18 0901) Pulse Rate: 75 (03/18 0901)  Labs: Recent Labs    06/13/19 0345 06/14/19 0420  HGB 11.5* 11.2*  HCT 35.1* 34.7*  PLT 395 394  CREATININE 0.77 0.78    Estimated Creatinine Clearance: 41.9 mL/min (by C-G formula based on SCr of 0.78 mg/dL).   Assessment: 86 year old female with afib who was not on ant-coagulation prior to admission due to recent [place] 3 weeks ago, admitted now with new CVA.  To begin heparin drip at low dose  with plan to monitor for re-bleed for 48 hours before restarting po anti-coagulation.  Goal of Therapy:  Heparin level 0.3-0.5 units/ml Monitor platelets by anticoagulation protocol: Yes   Plan:  Heparin at 700 units / hr Heparin level in 8 hours Daily heparin level, CBC  Thank you Anette Guarneri, PharmD  06/14/2019,10:11 AM

## 2019-06-14 NOTE — Progress Notes (Signed)
Modified Barium Swallow Progress Note  Patient Details  Name: [name] MRN: [id number] Date of Birth: [birth year]  Today's Date: 06/14/2019  Modified Barium Swallow completed.  Full report located under Chart Review in the Imaging Section.  Brief recommendations include the following:  Clinical Impression  Pts oral dysaphgia persists consistent with prior MBS, though ability to sustain arousal has improved. Left buccal and lingual weakenss result in struggle to form and propel bolus with pooling in the left buccal cavity. Pt senses residue and tilts head back to transit thicker bolus. There is significant premature spillage to the valleculae and pyriforms with aspiration (delayed sensation) before the swallow occuring with thin and nectar boluses. A body/head tilt right improved oral control, as well and increasing viscocity to honey and controlling bolus size and placement into the right buccal cavity with a spoon. Pt did fatigue while trying to masticate and solids and residue found all around the teeth. Pt also found to have a small to moderate Zenker's Diverticulum that did not significantly impact safety with PO with only trace backflow to pharynx and emptied intermittently with swallowing and gravity. Recommend pt initiate a dys 1 (puree) diet with honey thick liquids via spoon with assist to position tilted right and to feed with bolus placement on the right. If pt tolerates diet over 1-2 days recommend Cortrak removal.    Swallow Evaluation Recommendations       SLP Diet Recommendations: Dysphagia 1 (Puree) solids;Honey thick liquids   Liquid Administration via: Spoon   Medication Administration: Crushed with puree   Supervision: Full assist for feeding   Compensations: Slow rate(tilt head /body right)   Postural Changes: Other (Comment)(tilted right)   Oral Care Recommendations: Oral care before and after PO   Other Recommendations: Have oral suction  available   Herbie [place], MA [place] Pager [phone] Office [phone]  Lynann Beaver 06/14/2019,9:11 AM

## 2019-06-14 NOTE — Progress Notes (Signed)
ANTICOAGULATION CONSULT NOTE - Follow Up Consult  Pharmacy Consult for Heparin  Indication: stroke  Allergies  Allergen Reactions  . Morphine And Related Nausea And Vomiting    Patient Measurements: Weight: 146 lb (66.2 kg)  Height:  63 inches Heparin Dosing Weight: 66.2 kg  Vital Signs: Temp: 98.2 F (36.8 C) (03/18 1948) Temp Source: Oral (03/18 1948) BP: 135/67 (03/18 1948) Pulse Rate: 93 (03/18 1948)  Labs: Recent Labs    06/13/19 0345 06/14/19 0420 06/14/19 2026  HGB 11.5* 11.2*  --   HCT 35.1* 34.7*  --   PLT 395 394  --   HEPARINUNFRC  --   --  1.02*  CREATININE 0.77 0.78  --     Estimated Creatinine Clearance: 41.9 mL/min (by C-G formula based on SCr of 0.78 mg/dL).   Assessment: 86 year old female with afib who was not on anticoagulation prior to admission due to recent [place] 3 weeks ago was admitted with new CVA.  Pharmacy consulted to dose heparin at low intensity  with plan to monitor for re-bleed for 48 hours before restarting oral anticoagulation.  Heparin level ~9 hrs after starting heparin infusion at 700 units/hr was 1.02 units/ml, which is above the goal range for this pt. CBC stable. Per RN, no issues with IV or bleeding observed. Per phlebotomist, heparin level was not drawn from the same arm in which the heparin IV was infusing.  Goal of Therapy:  Heparin level 0.3-0.5 units/ml Monitor platelets by anticoagulation protocol: Yes   Plan:  Stop heparin infusion for 1 hour, then restart heparin infusion at  500 units/hr Check 8-hr heparin level Monitor daily heparin level, CBC Monitor for signs/symptoms of bleeding  Gillermina Hu, PharmD, BCPS, [facility] Clinical Pharmacist 06/14/2019,9:20 PM

## 2019-06-14 NOTE — Progress Notes (Signed)
Occupational Therapy Treatment Patient Details Name: [name] MRN: [id number] DOB: Sep 28, 1925 Today's Date: 06/14/2019    History of present illness 86 y.o. female with PMH of hypertension, hyperlipidemia, pacer for bradycardia, afib currently not on AC and recent left occipital ICH 3 weeks ago presented from SNF to ER for acute onset right gaze, left facial droop, slurred speech, right hemiplegia. LSW 8:30 am. CT showed evolving left ICH stable with the surrounding cerebral edema. CT head and neck showed right superior branch of M2 decreased caliber with intermittent stenosis concerning for nonocclusive clot comparing with her CTA 3 weeks ago.   OT comments  Pt making progress in therapy, demonstrating improved independence with self-care and functional transfer tasks. Continued education with pt on attending to left side and incorporating use of left hand into all tasks. Pt tolerated sitting EOB 15 min with variable supervision to min assist while engaging in LB dressing task. Pt initiating and attempting to pull up underwear with left hand however limited due to poor grip strength. Pt required mod assist for sit/stand and min assist to transfer to bedside chair with RW.  Pt required min assist to place LUE on RW and to reach back for armrest on chair. Noted 0 instances of LOB, however pt unsteady on feet. Worked on self-feeding task while seated in bedside chair. Assist to place drink in left hand, utilizing right hand to stabilize. Min assist to bring drink to mouth. Cues for swallow strategies. Pt required increased time and cues to complete all tasks. OT will continue to follow acutely. Continue to recommend SNF placement for additional rehab prior to discharge home.      Follow Up Recommendations  SNF;Supervision/Assistance - 24 hour    Equipment Recommendations  3 in 1 bedside commode    Recommendations for Other Services      Precautions / Restrictions Precautions Precautions:  Fall Precaution Comments: Right side weakness - 3 weeks ago.  Now left side weakness Restrictions Weight Bearing Restrictions: No       Mobility Bed Mobility Overal bed mobility: Needs Assistance Bed Mobility: Supine to Sit     Supine to sit: Mod assist;Max assist;HOB elevated     General bed mobility comments: Pt required cues to utilize LUE during task. Assist for BLEs and trunk.   Transfers Overall transfer level: Needs assistance Equipment used: Rolling walker (2 wheeled) Transfers: Sit to/from [facility] Sit to Stand: Mod assist Stand pivot transfers: Min assist       General transfer comment: Cues for hand placement. Pt required min assist to place LUE on RW and to reach back for armrest on chair.  Pt able to pivot over to bedside chair with min assist for balance and safety.     Balance Overall balance assessment: Needs assistance Sitting-balance support: Bilateral upper extremity supported;Feet supported Sitting balance-Leahy Scale: Fair       Standing balance-Leahy Scale: Poor                             ADL either performed or assessed with clinical judgement   ADL Overall ADL's : Needs assistance/impaired Eating/Feeding: Minimal assistance;Cueing for safety;Cueing for compensatory techinques Eating/Feeding Details (indicate cue type and reason): While seated in bedside chair. Assist to place drink in left hand, utilizing right hand to stabilize. Min assist to bring drink to mouth. Cues for swallow strategies. Pt able to take 3 sips with increased time to complete.  Lower Body Bathing: Maximal assistance;Sit to/from stand;Sitting/lateral leans Lower Body Bathing Details (indicate cue type and reason): Pt incontinent of urine in bed requiring assist to get cleaned up in standing.      Lower Body Dressing: Maximal assistance;Sitting/lateral leans;Sit to/from stand Lower Body Dressing Details (indicate cue type and  reason): Total assist to don socks. Pt able to thread RLE through underwear requiring max assist to thread LLE. Pt able to pull up underwear on right in standing, requiring max assist to pull up on left. Pt initiating and attempting to pull up underwear with left hand however limited grip strength.      Toileting- Clothing Manipulation and Hygiene: Total assistance;Sit to/from stand Toileting - Clothing Manipulation Details (indicate cue type and reason): Pt incontinent of urine requiring total assist for peri care in standing.      Functional mobility during ADLs: Minimal assistance;Rolling walker General ADL Comments: Pt tolerated sitting EOB 15 min with variable supervision to min assist for balance and safety. Pt transferred to bedside chair with RW and min assist. Noted 0 instances of LOB, however pt unsteady on feet.      Vision       Perception     Praxis      Cognition Arousal/Alertness: Lethargic Behavior During Therapy: WFL for tasks assessed/performed Overall Cognitive Status: Impaired/Different from baseline Area of Impairment: Memory;Following commands;Safety/judgement;Awareness;Problem solving                     Memory: Decreased recall of precautions;Decreased short-term memory Following Commands: Follows one step commands inconsistently;Follows one step commands with increased time Safety/Judgement: Decreased awareness of safety;Decreased awareness of deficits Awareness: Intellectual Problem Solving: Slow processing;Requires verbal cues General Comments: hard of hearing        Exercises     Shoulder Instructions       General Comments VSS on room air. Son Dominica Severin present in room. Educated son on sitting on pt's left to encourage attention to left side.     Pertinent Vitals/ Pain       Pain Assessment: Faces Faces Pain Scale: Hurts little more Pain Location: L groin Pain Descriptors / Indicators: Aching Pain Intervention(s): Monitored during  session;Repositioned  Home Living                                          Prior Functioning/Environment              Frequency           Progress Toward Goals  OT Goals(current goals can now be found in the care plan section)  Progress towards OT goals: Progressing toward goals  ADL Goals Pt Will Perform Eating: with modified independence Pt Will Perform Grooming: with supervision;sitting Pt Will Perform Upper Body Dressing: with supervision;sitting Pt Will Perform Lower Body Dressing: with min guard assist;sit to/from stand Pt Will Transfer to Toilet: with min guard assist;regular height toilet Pt Will Perform Toileting - Clothing Manipulation and hygiene: with min guard assist;sit to/from stand Additional ADL Goal #1: Pt will locate ADL items in room with min cues to turn head to the left to locate items on left side due to vision loss.  Plan Discharge plan remains appropriate    Co-evaluation                 AM-PAC OT "6 Clicks" Daily Activity  Outcome Measure   Help from another person eating meals?: A Lot Help from another person taking care of personal grooming?: A Little Help from another person toileting, which includes using toliet, bedpan, or urinal?: A Lot Help from another person bathing (including washing, rinsing, drying)?: A Lot Help from another person to put on and taking off regular upper body clothing?: A Lot Help from another person to put on and taking off regular lower body clothing?: A Lot 6 Click Score: 13    End of Session Equipment Utilized During Treatment: Gait belt;Rolling walker  OT Visit Diagnosis: Unsteadiness on feet (R26.81);Low vision, both eyes (H54.2);Hemiplegia and hemiparesis;Muscle weakness (generalized) (M62.81);Other abnormalities of gait and mobility (R26.89) Hemiplegia - Right/Left: Left Hemiplegia - dominant/non-dominant: Non-Dominant Hemiplegia - caused by: Cerebral infarction   Activity  Tolerance Patient limited by fatigue   Patient Left in chair;with call bell/phone within reach;with chair alarm set;with nursing/sitter in room   Nurse Communication Mobility status        Time: [id number] OT Time Calculation (min): 48 min  Charges: OT General Charges $OT Visit: 1 Visit OT Treatments $Self Care/Home Management : 23-37 mins $Therapeutic Activity: 8-22 mins  Mauri Brooklyn OTR/L [phone]   Mauri Brooklyn 06/14/2019, 11:37 AM

## 2019-06-15 LAB — BASIC METABOLIC PANEL
Anion gap: 12 (ref 5–15)
BUN: 31 mg/dL — ABNORMAL HIGH (ref 8–23)
CO2: 29 mmol/L (ref 22–32)
Calcium: 8.9 mg/dL (ref 8.9–10.3)
Chloride: 95 mmol/L — ABNORMAL LOW (ref 98–111)
Creatinine, Ser: 0.81 mg/dL (ref 0.44–1.00)
GFR calc Af Amer: >60 mL/min (ref >60)
GFR calc non Af Amer: >60 mL/min (ref >60)
Glucose, Bld: 134 mg/dL — ABNORMAL HIGH (ref 70–99)
Potassium: 3.8 mmol/L (ref 3.5–5.1)
Sodium: 136 mmol/L (ref 135–145)

## 2019-06-15 LAB — HEPARIN LEVEL (UNFRACTIONATED)
Heparin Unfractionated: 0.62 IU/mL (ref 0.30–0.70)
Heparin Unfractionated: 0.25 IU/mL — ABNORMAL LOW (ref 0.30–0.70)

## 2019-06-15 LAB — CBC
HCT: 33.6 % — ABNORMAL LOW (ref 36.0–46.0)
Hemoglobin: 10.6 g/dL — ABNORMAL LOW (ref 12.0–15.0)
MCH: 26.8 pg (ref 26.0–34.0)
MCHC: 31.5 g/dL (ref 30.0–36.0)
MCV: 85.1 fL (ref 80.0–100.0)
Platelets: 347 10*3/uL (ref 150–400)
RBC: 3.95 MIL/uL (ref 3.87–5.11)
RDW: 22.3 % — ABNORMAL HIGH (ref 11.5–15.5)
WBC: 11.4 10*3/uL — ABNORMAL HIGH (ref 4.0–10.5)
nRBC: 0 % (ref 0.0–0.2)

## 2019-06-15 LAB — GLUCOSE, CAPILLARY
Glucose-Capillary: 124 mg/dL — ABNORMAL HIGH (ref 70–99)
Glucose-Capillary: 139 mg/dL — ABNORMAL HIGH (ref 70–99)
Glucose-Capillary: 118 mg/dL — ABNORMAL HIGH (ref 70–99)
Glucose-Capillary: 124 mg/dL — ABNORMAL HIGH (ref 70–99)

## 2019-06-15 MED ORDER — HYDRALAZINE HCL 25 MG PO TABS
25.0000 mg | ORAL_TABLET | Freq: Three times a day (TID) | ORAL | Status: DC
  Administered 2019-06-15 – 2019-06-18 (×10): 25 mg
  Filled 2019-06-15 (×10): qty 1

## 2019-06-15 NOTE — Progress Notes (Signed)
SLP Cancellation Note  Patient Details Name: RASCHELLE MANJARRES MRN: [id number] DOB: April 18, 1925   Cancelled treatment:       Reason Eval/Treat Not Completed: Patient at procedure or test/unavailable. Getting RN care with PT waiting to tx pt.    Shequita Peplinski, Katherene Ponto 06/15/2019, 11:46 AM

## 2019-06-15 NOTE — Plan of Care (Signed)

## 2019-06-15 NOTE — Progress Notes (Signed)
ANTICOAGULATION CONSULT NOTE - Follow Up Consult  Pharmacy Consult for heparin Indication: Afib in the setting of acute stroke  Labs: Recent Labs    06/13/19 0345 06/13/19 0345 06/14/19 0420 06/14/19 2026 06/15/19 0600  HGB 11.5*   < > 11.2*  --  10.6*  HCT 35.1*  --  34.7*  --  33.6*  PLT 395  --  394  --  347  HEPARINUNFRC  --   --   --  1.02* 0.62  CREATININE 0.77  --  0.78  --   --    < > = values in this interval not displayed.    Assessment: 86yo female remains supratherapeutic on heparin after rate change though closer to goal; no gtt issues or signs of bleeding per RN.  Goal of Therapy:  Heparin level 0.3-0.5 units/ml   Plan:  Will decrease heparin gtt by 1-2 units/kg/hr to 400 units/hr and check level in 8 hours.    Wynona Neat, PharmD, BCPS  06/15/2019,6:40 AM

## 2019-06-15 NOTE — Progress Notes (Signed)
PROGRESS NOTE  [name] [id number] DOB: [birth year] DOA: 06/08/2019 PCP: [name], MD  Brief History   86 y.o.femalewith medical history significant ofhypertension, hyperlipidemia,PPM, afiboff anticoagulationsand recent left occipital ICH 3 weeks ago presented from SNF to ER for new onset of slurred speech and right sided weakness. Pt was last see at her baseline about 8 AM this morning, then pt was found to have right sided gaze,left facial droop, slurred speech,lefthemiplegia. CODE STROKE triggered in ED. Pt was mumbling, hard to understand her talking, but she denied any pain, no SOB, she pointed to her throat and saying she can hardly clear her throat. ED Course:CT showed evolving left ICH stable with the surrounding cerebral edema. No other ICH but right insular cortex ischemic attenuation. CT head and neck showed right superior branch of M2 decreased caliber with intermittent stenosis concerning fornonocclusive clot comparing with her CTA 3 weeks prior to admit.  Plan is for discharge to SNF. Recommendation from neurology is to place the patient on a heparin drip in the am of 06/14/2019 and monitor her for bleed for 48 hours. If she has had no signs of bleeding, she may be started on Eliquis and discharged to SNF.  Consultants  . Neurology  Antibiotics   Anti-infectives (From admission, onward)   None     Subjective  The patient is sitting up at bedside. No new complaints. Family at bedside.  Objective   Vitals:  Vitals:   06/15/19 0800 06/15/19 1147  BP: 138/70 (!) 164/73  Pulse: 82 79  Resp: 18 19  Temp: 98.8 F (37.1 C) 98.4 F (36.9 C)  SpO2: 94% 96%   Exam:  Constitutional:  . The patient is awake, alert, and oriented x 3. No acute distress. Respiratory:  . No increased work of breathing. . No wheezes, rales, or rhonchi . No tactile fremitus Cardiovascular:  . Regular rate and rhythm . No murmurs, ectopy, or gallups. . No  lateral PMI. No thrills. Abdomen:  . Abdomen is soft, non-tender, non-distended . No hernias, masses, or organomegaly . Normoactive bowel sounds.  Musculoskeletal:  . No cyanosis, clubbing, or edema Skin:  . No rashes, lesions, ulcers . palpation of skin: no induration or nodules Neurologic:  . CN 2-12 intact . Sensation all 4 extremities intact Psychiatric:  . Mental status o Mood, affect appropriate o Orientation to person, place, time  . judgment and insight appear intact  I have personally reviewed the following:   Today's Data  . Vitals, CMP, CBC  Scheduled Meds: . amLODipine  10 mg Per Tube q morning - 10a  . aspirin  325 mg Per Tube Daily  . atorvastatin  40 mg Per Tube q1800  . Chlorhexidine Gluconate Cloth  6 each Topical Q0600  . feeding supplement (PRO-STAT SUGAR FREE 64)  30 mL Per Tube Daily  . free water  140 mL Per Tube QID  . furosemide  40 mg Intravenous Daily  . irbesartan  300 mg Per Tube q morning - 10a  . methimazole  5 mg Per Tube TID  . pantoprazole sodium  40 mg Per Tube Daily   Continuous Infusions: . feeding supplement (OSMOLITE 1.2 CAL) 1,000 mL (06/13/19 1909)  . heparin 400 Units/hr (06/15/19 [id number])    Active Problems:   Cerebral embolism with cerebral infarction   Cerebral infarction ([place])   LOS: 7 days   A & P  R MCA stroke: complicating this event is the patient's recent [place] prior to admit.  She is also unable to undergo MRI given pacer. However, evolving CVA noted on CT head. Neurology was consulted and the patient has been admitted to a telemetry bed. She is receiving ASA.  Permissive hypertension has been practiced. The patient has been evaluated by PT/OT. Recommendation is for SNF. I have discussed this patient with Dr. Erlinda Hong. He recommends heparin drip in the am of 06/14/2019 and monitor her for bleed for 48 hours. If she has had no signs of bleeding, she may be started on Eliquis and discharged to SNF.  Essential HTN: BP continues to  be poorly controlled. I have changed hydralazine to per tube and increased frequency to tid from prn.  Hypokalemia: Supplement and monitor.  Hyperthyroid: Pt is on methimazole. Stable.  Chronic Afib: Rate is controlled. Will restart Eliquis if no signs of bleeding after 48 hours on heparin drip. Hemodynamically stable at this time.  Hx chronic diastolic chf: Euvolemic at this time. CXR demonstrates no pulonary edema or pulmonary vascular congestion. LVEF normal on 2d echo, reviewed. Will resume home dosage of lasix. Monitor volume status.  I have seen and examined this patient myself. I have spent 30 minutes in her evaluation and care.  DVT prophylaxis: Heparin subq Code Status: DNR Family Communication: Pt in room, pt's family at bedside Disposition Plan: From SNF, anticipate d/c SNF when cleared by neurology and when off heparin gtt, on NOAC.   Denys Salinger, DO [facility] Direct contact: see [email or web address]  7PM-7AM contact night coverage as above 06/15/2019, 2:08 PM  LOS: 5 days

## 2019-06-15 NOTE — Progress Notes (Signed)
PT Cancellation Note  Patient Details Name: [name] MRN: [id number] DOB: 1925-07-28   Cancelled Treatment:    Reason Eval/Treat Not Completed: (P) Other (comment)(Pt unavailable, RN remains in room >40 min and lunch tray has just arrived will f/u when patient is available to provide assessment for insurance authorization.)   Cristela Blue 06/15/2019, 11:55 AM  Erasmo Leventhal , PTA Acute Rehabilitation Services Pager [phone] Office [phone]

## 2019-06-15 NOTE — Progress Notes (Signed)
Physical Therapy Treatment Patient Details Name: [name] MRN: [id number] DOB: 1926/01/24 Today's Date: 06/15/2019    History of Present Illness 86 y.o. female with PMH of hypertension, hyperlipidemia, pacer for bradycardia, afib currently not on AC and recent left occipital ICH 3 weeks ago presented from SNF to ER for acute onset right gaze, left facial droop, slurred speech, right hemiplegia. LSW 8:30 am. CT showed evolving left ICH stable with the surrounding cerebral edema. CT head and neck showed right superior branch of M2 decreased caliber with intermittent stenosis concerning for nonocclusive clot comparing with her CTA 3 weeks ago.    PT Comments    Pt performed gt training and functional mobility.  She presents with poor safety and awareness. Pt continues to benefit from rehab in a post acute setting.  Plan next session for progression of gt training.     Follow Up Recommendations  SNF;Supervision/Assistance - 24 hour     Equipment Recommendations  (defer to post acute setting.)    Recommendations for Other Services       Precautions / Restrictions Precautions Precautions: Fall Precaution Comments: Right side weakness - 3 weeks ago.  Now left side weakness Restrictions Weight Bearing Restrictions: No Other Position/Activity Restrictions: No dizziness this session    Mobility  Bed Mobility Overal bed mobility: Needs Assistance Bed Mobility: Rolling;Sidelying to Sit Rolling: Mod assist Sidelying to sit: Mod assist       General bed mobility comments: Pt required assistance to advance LEs to edge of bed and to elevate trunk into a seated position.  Pt wirth slight posterior bias but improved once scooted edge of bed.  Transfers Overall transfer level: Needs assistance Equipment used: Rolling walker (2 wheeled) Transfers: Sit to/from Stand Sit to Stand: Min assist         General transfer comment: Cuers for hand placement and assistance to boost into a  standing position.  Pt with slight posterior bias but as she stood this improved.  Ambulation/Gait Ambulation/Gait assistance: Min assist;Mod assist Gait Distance (Feet): 22 Feet Assistive device: Rolling walker (2 wheeled) Gait Pattern/deviations: Step-through pattern;Shuffle;Trunk flexed;Narrow base of support Gait velocity: reduced   General Gait Details: Pt required increased assistance to turn and back to seated surface.  Pt slow and guarded and required cues for posture and RW safety.  Poor negotiation around obstacles in room.   Stairs             Wheelchair Mobility    Modified Rankin (Stroke Patients Only) Modified Rankin (Stroke Patients Only) Pre-Morbid Rankin Score: Moderately severe disability Modified Rankin: Moderately severe disability     Balance Overall balance assessment: Needs assistance Sitting-balance support: Bilateral upper extremity supported;Feet supported Sitting balance-Leahy Scale: Fair       Standing balance-Leahy Scale: Poor                              Cognition Arousal/Alertness: Awake/alert Behavior During Therapy: WFL for tasks assessed/performed Overall Cognitive Status: Within Functional Limits for tasks assessed(for tasks assessed cognition not formally assessed.)                                        Exercises      General Comments        Pertinent Vitals/Pain Pain Assessment: Faces Faces Pain Scale: Hurts little more Pain Location: L groin  Pain Descriptors / Indicators: Aching Pain Intervention(s): Monitored during session;Repositioned    Home Living                      Prior Function            PT Goals (current goals can now be found in the care plan section) Acute Rehab PT Goals Patient Stated Goal: To improve mobility PT Goal Formulation: With patient Potential to Achieve Goals: Fair Progress towards PT goals: Progressing toward goals    Frequency    Min  3X/week      PT Plan Current plan remains appropriate    Co-evaluation              AM-PAC PT "6 Clicks" Mobility   Outcome Measure  Help needed turning from your back to your side while in a flat bed without using bedrails?: A Little Help needed moving from lying on your back to sitting on the side of a flat bed without using bedrails?: A Lot Help needed moving to and from a bed to a chair (including a wheelchair)?: A Lot Help needed standing up from a chair using your arms (e.g., wheelchair or bedside chair)?: A Lot Help needed to walk in hospital room?: A Lot Help needed climbing 3-5 steps with a railing? : Total 6 Click Score: 12    End of Session Equipment Utilized During Treatment: Gait belt Activity Tolerance: Patient tolerated treatment well Patient left: in chair;with call bell/phone within reach;with chair alarm set Nurse Communication: Mobility status PT Visit Diagnosis: Other abnormalities of gait and mobility (R26.89);History of falling (Z91.81);Muscle weakness (generalized) (M62.81);Difficulty in walking, not elsewhere classified (R26.2);Hemiplegia and hemiparesis Hemiplegia - Right/Left: Left Hemiplegia - dominant/non-dominant: Non-dominant Hemiplegia - caused by: Cerebral infarction     Time: [id number] PT Time Calculation (min) (ACUTE ONLY): 18 min  Charges:  $Gait Training: 8-22 mins                     Erasmo Leventhal , PTA Acute Rehabilitation Services Pager [phone] Office [phone]     [name] 06/15/2019, 2:05 PM

## 2019-06-15 NOTE — Progress Notes (Signed)
PT Cancellation Note  Patient Details Name: MARKERIA DILTS MRN: [id number] DOB: 07-27-1925   Cancelled Treatment:    Reason Eval/Treat Not Completed: (P) Other (comment)(RN in to do med pass, will wait to perform PT tx to provide note for insurance authorization.)   Cristela Blue 06/15/2019, 11:56 AM  Erasmo Leventhal , PTA Acute Rehabilitation Services Pager [phone] Office [phone]

## 2019-06-15 NOTE — Progress Notes (Signed)
ANTICOAGULATION CONSULT NOTE - Follow Up Consult  Pharmacy Consult for heparin Indication: Afib in the setting of acute stroke  Labs: Recent Labs    06/13/19 0345 06/13/19 0345 06/14/19 0420 06/14/19 2026 06/15/19 0600 06/15/19 1437  HGB 11.5*   < > 11.2*  --  10.6*  --   HCT 35.1*  --  34.7*  --  33.6*  --   PLT 395  --  394  --  347  --   HEPARINUNFRC  --   --   --  1.02* 0.62 0.25*  CREATININE 0.77  --  0.78  --  0.81  --    < > = values in this interval not displayed.    Assessment: 86yo female continues on heparin  Heparin level now a little low = 0.25  Goal of Therapy:  Heparin level 0.3-0.5 units/ml   Plan:  Increase heparin slightly to 450 units / hr Follow up AM labs  Thank you Anette Guarneri, PharmD  06/15/2019,3:16 PM

## 2019-06-16 ENCOUNTER — Inpatient Hospital Stay (HOSPITAL_COMMUNITY): Payer: Medicare Other

## 2019-06-16 LAB — CBC
HCT: 32.5 % — ABNORMAL LOW (ref 36.0–46.0)
Hemoglobin: 10.5 g/dL — ABNORMAL LOW (ref 12.0–15.0)
MCH: 26.9 pg (ref 26.0–34.0)
MCHC: 32.3 g/dL (ref 30.0–36.0)
MCV: 83.3 fL (ref 80.0–100.0)
Platelets: 362 10*3/uL (ref 150–400)
RBC: 3.9 MIL/uL (ref 3.87–5.11)
RDW: 22.2 % — ABNORMAL HIGH (ref 11.5–15.5)
WBC: 11 10*3/uL — ABNORMAL HIGH (ref 4.0–10.5)
nRBC: 0 % (ref 0.0–0.2)

## 2019-06-16 LAB — HEPARIN LEVEL (UNFRACTIONATED): Heparin Unfractionated: 0.2 IU/mL — ABNORMAL LOW (ref 0.30–0.70)

## 2019-06-16 LAB — GLUCOSE, CAPILLARY
Glucose-Capillary: 109 mg/dL — ABNORMAL HIGH (ref 70–99)
Glucose-Capillary: 119 mg/dL — ABNORMAL HIGH (ref 70–99)
Glucose-Capillary: 123 mg/dL — ABNORMAL HIGH (ref 70–99)
Glucose-Capillary: 124 mg/dL — ABNORMAL HIGH (ref 70–99)
Glucose-Capillary: 127 mg/dL — ABNORMAL HIGH (ref 70–99)
Glucose-Capillary: 132 mg/dL — ABNORMAL HIGH (ref 70–99)

## 2019-06-16 MED ORDER — APIXABAN 2.5 MG PO TABS
2.5000 mg | ORAL_TABLET | Freq: Two times a day (BID) | ORAL | Status: DC
  Administered 2019-06-16 – 2019-06-18 (×5): 2.5 mg via ORAL
  Filled 2019-06-16 (×5): qty 1

## 2019-06-16 MED ORDER — METOPROLOL TARTRATE 25 MG PO TABS
25.0000 mg | ORAL_TABLET | Freq: Two times a day (BID) | ORAL | Status: DC
  Administered 2019-06-16 – 2019-06-18 (×5): 25 mg via ORAL
  Filled 2019-06-16 (×5): qty 1

## 2019-06-16 NOTE — TOC Progression Note (Addendum)
Transition of Care [facility]) - Progression Note    Patient Details  Name: [name] MRN: [id number] Date of Birth: [birth year]  Transition of Care [facility]) CM/SW [place], [place] Phone Number: 06/16/2019, 1:54 PM  Clinical Narrative:    CSW initiated insurance authorization & faxed clinicals, reference [id number]. TOC team will continue to follow & assist with discharge planning needs.   Expected Discharge Plan: [place] Barriers to Discharge: Continued Medical Work up  Expected Discharge Plan and Services Expected Discharge Plan: [place] Choice: [place] Living arrangements for the past 2 months: Skilled Nursing Facility(from [facility])                                       Social Determinants of Health (SDOH) Interventions    Readmission Risk Interventions No flowsheet data found.

## 2019-06-16 NOTE — Progress Notes (Signed)
ANTICOAGULATION CONSULT NOTE - Follow Up Consult  Pharmacy Consult for heparin Indication: Afib in the setting of acute stroke  Labs: Recent Labs    06/14/19 0420 06/14/19 2026 06/15/19 0600 06/15/19 1437 06/16/19 0347  HGB 11.2*  --  10.6*  --  10.5*  HCT 34.7*  --  33.6*  --  32.5*  PLT 394  --  347  --  362  HEPARINUNFRC  --    < > 0.62 0.25* 0.20*  CREATININE 0.78  --  0.81  --   --    < > = values in this interval not displayed.    Assessment: 86yo female continues on heparin.  Heparin level remains low (0.2) on gtt at 450 units/hr. No issues with line or bleeding reported per RN.  Goal of Therapy:  Heparin level 0.3-0.5 units/ml   Plan:  Increase heparin to 550 units / hr Will f/u heparin level in 8 hours  Sherlon Handing, PharmD, BCPS Please see amion for complete clinical pharmacist phone list 06/16/2019,5:09 AM

## 2019-06-16 NOTE — Progress Notes (Signed)
Physical Therapy Treatment Patient Details Name: [name] MRN: [id number] DOB: [birth year] Today's Date: 06/16/2019    History of Present Illness 86 y.o. female with PMH of hypertension, hyperlipidemia, pacer for bradycardia, afib currently not on AC and recent left occipital ICH 3 weeks ago presented from SNF to ER for acute onset right gaze, left facial droop, slurred speech, right hemiplegia. LSW 8:30 am. CT showed evolving left ICH stable with the surrounding cerebral edema. CT head and neck showed right superior branch of M2 decreased caliber with intermittent stenosis concerning for nonocclusive clot comparing with her CTA 3 weeks ago.    PT Comments    On arrival to room pt reported need to use restroom, however when removing the purwik, noted that bed was saturated with urine and pt had BM in bed. Assisted pt with peri care prior to standing and performing SPT to [facility]. Ambulation distance limited secondary to fatigue. Pt currently requires assist with all mobilities and remains appropriate for SNF placement. Will continue to follow acutely.    Follow Up Recommendations  SNF;Supervision/Assistance - 24 hour     Equipment Recommendations  (defer to post acute setting.)    Recommendations for Other Services       Precautions / Restrictions Precautions Precautions: Fall Precaution Comments: Right side weakness - 3 weeks ago.  Now left side weakness Restrictions Weight Bearing Restrictions: No    Mobility  Bed Mobility Overal bed mobility: Needs Assistance Bed Mobility: Rolling;Sidelying to Sit Rolling: Mod assist Sidelying to sit: Mod assist       General bed mobility comments: Pt able to roll L and R for peri care with mod A and cues for technique. Mod A to power up from side laying to sitting EOB. Able to scoot hips forward with min guard for safety.  Transfers Overall transfer level: Needs assistance Equipment used: Rolling walker (2 wheeled) Transfers: Sit  to/from Stand Sit to Stand: Min assist Stand pivot transfers: Min assist       General transfer comment: min A to power up and cues for hand placement. Pt's movements for SPT are very slow and guarded. Assist required for balance and RW management.   Ambulation/Gait Ambulation/Gait assistance: Min assist Gait Distance (Feet): 2 Feet Assistive device: Rolling walker (2 wheeled) Gait Pattern/deviations: Step-through pattern;Shuffle;Trunk flexed;Narrow base of support Gait velocity: reduced Gait velocity interpretation: <1.31 ft/sec, indicative of household ambulator General Gait Details: Pt ambulated 5 ft from [facility] to recliner chair with min A for stability and RW management. Pt very weak after rolling for peri care and getting up to [facility] Further ambulation deffered.    Stairs             Wheelchair Mobility    Modified Rankin (Stroke Patients Only) Modified Rankin (Stroke Patients Only) Pre-Morbid Rankin Score: Moderately severe disability Modified Rankin: Moderately severe disability     Balance Overall balance assessment: Needs assistance Sitting-balance support: Bilateral upper extremity supported;Feet supported Sitting balance-Leahy Scale: Fair Sitting balance - Comments: able to scoot forward once seated EOB. Min guard for safety.   Standing balance support: Bilateral upper extremity supported Standing balance-Leahy Scale: Poor Standing balance comment: minA with BUE support of RW                            Cognition Arousal/Alertness: Awake/alert Behavior During Therapy: WFL for tasks assessed/performed Overall Cognitive Status: Within Functional Limits for tasks assessed(for tasks assessed cognition not formally assessed.)  Exercises      General Comments General comments (skin integrity, edema, etc.): Pt had BM in bed requiring assist for peri care. Once standing she reported need to  transfer to [facility] to complete BM. Pt was able to stand w/o assist while PTA performed peri care at [facility]      Pertinent Vitals/Pain Pain Assessment: Faces Faces Pain Scale: Hurts little more Pain Location: L groin Pain Descriptors / Indicators: Aching Pain Intervention(s): Monitored during session;Limited activity within patient's tolerance;Repositioned    Home Living                      Prior Function            PT Goals (current goals can now be found in the care plan section) Acute Rehab PT Goals Patient Stated Goal: To improve mobility PT Goal Formulation: With patient Time For Goal Achievement: 06/23/19 Potential to Achieve Goals: Fair Progress towards PT goals: Progressing toward goals    Frequency    Min 3X/week      PT Plan Current plan remains appropriate    Co-evaluation              AM-PAC PT "6 Clicks" Mobility   Outcome Measure  Help needed turning from your back to your side while in a flat bed without using bedrails?: A Lot Help needed moving from lying on your back to sitting on the side of a flat bed without using bedrails?: A Lot Help needed moving to and from a bed to a chair (including a wheelchair)?: A Little Help needed standing up from a chair using your arms (e.g., wheelchair or bedside chair)?: A Little Help needed to walk in hospital room?: A Lot Help needed climbing 3-5 steps with a railing? : Total 6 Click Score: 13    End of Session Equipment Utilized During Treatment: Gait belt Activity Tolerance: Patient limited by fatigue Patient left: in chair;with call bell/phone within reach;with chair alarm set;with family/visitor present Nurse Communication: Mobility status;Patient requests pain meds(purwik removed and bed stripped) PT Visit Diagnosis: Other abnormalities of gait and mobility (R26.89);History of falling (Z91.81);Muscle weakness (generalized) (M62.81);Difficulty in walking, not elsewhere classified (R26.2);Hemiplegia  and hemiparesis Hemiplegia - Right/Left: Left Hemiplegia - dominant/non-dominant: Non-dominant Hemiplegia - caused by: Cerebral infarction     Time: 1121-1200 PT Time Calculation (min) (ACUTE ONLY): 39 min  Charges:  $Therapeutic Activity: 38-52 mins                     Benjiman Core, [place] Pager [phone] Acute Rehab  Allena Katz 06/16/2019, 1:27 PM

## 2019-06-16 NOTE — Discharge Instructions (Signed)

## 2019-06-16 NOTE — Progress Notes (Signed)
PROGRESS NOTE  [name] [id number] DOB: 08-Mar-1926 DOA: 06/08/2019 PCP: [name], MD  Brief History   86 y.o.femalewith medical history significant ofhypertension, hyperlipidemia,PPM, afiboff anticoagulationsand recent left occipital ICH 3 weeks ago presented from SNF to ER for new onset of slurred speech and right sided weakness. Pt was last see at her baseline about 8 AM this morning, then pt was found to have right sided gaze,left facial droop, slurred speech,lefthemiplegia. CODE STROKE triggered in ED. Pt was mumbling, hard to understand her talking, but she denied any pain, no SOB, she pointed to her throat and saying she can hardly clear her throat. CT showed evolving left ICH stable with the surrounding cerebral edema. No other ICH but right insular cortex ischemic attenuation. CT head and neck showed right superior branch of M2 decreased caliber with intermittent stenosis concerning fornonocclusive clot comparing with her CTA 3 weeks prior to admit.  Plan is for discharge to SNF. Recommendation from neurology is to place the patient on a heparin drip in the am of 06/14/2019 and monitor her for bleed for 48 hours. If she has had no signs of bleeding, she may be started on Eliquis and discharged to SNF.  Consultants  . Neurology  Antibiotics   Anti-infectives (From admission, onward)   None     Subjective  The patient is sitting up at bedside. No new complaints.  Objective   Vitals:  Vitals:   06/16/19 0830 06/16/19 1256  BP: (!) 161/69 (!) 179/84  Pulse: 66 76  Resp: (!) 21 17  Temp: 98.3 F (36.8 C) 98.4 F (36.9 C)  SpO2: 98% 98%   Exam:  Constitutional:  . The patient is awake, alert, and oriented x 3. No acute distress. Respiratory:  . No increased work of breathing. . No wheezes, rales, or rhonchi . No tactile fremitus Cardiovascular:  . Regular rate and rhythm . No murmurs, ectopy, or gallups. . No lateral PMI. No  thrills. Abdomen:  . Abdomen is soft, non-tender, non-distended . No hernias, masses, or organomegaly . Normoactive bowel sounds.  Musculoskeletal:  . No cyanosis, clubbing, or edema Skin:  . No rashes, lesions, ulcers . palpation of skin: no induration or nodules Neurologic:  . CN 2-12 intact . Sensation all 4 extremities intact Psychiatric:  . Mental status o Mood, affect appropriate o Orientation to person, place, time  . judgment and insight appear intact  I have personally reviewed the following:   Today's Data  . Vitals, CBC  Scheduled Meds: . amLODipine  10 mg Per Tube q morning - 10a  . apixaban  2.5 mg Oral BID  . atorvastatin  40 mg Per Tube q1800  . Chlorhexidine Gluconate Cloth  6 each Topical Q0600  . feeding supplement (PRO-STAT SUGAR FREE 64)  30 mL Per Tube Daily  . free water  140 mL Per Tube QID  . furosemide  40 mg Intravenous Daily  . hydrALAZINE  25 mg Per Tube Q8H  . irbesartan  300 mg Per Tube q morning - 10a  . methimazole  5 mg Per Tube TID  . pantoprazole sodium  40 mg Per Tube Daily   Continuous Infusions: . feeding supplement (OSMOLITE 1.2 CAL) 1,000 mL (06/15/19 1626)    Active Problems:   Cerebral embolism with cerebral infarction   Cerebral infarction ([place])   LOS: 8 days   A & P  R MCA stroke: complicating this event is the patient's recent [place] prior to admit. She is  also unable to undergo MRI given pacer. However, evolving CVA noted on CT head. Neurology was consulted and the patient has been admitted to a telemetry bed. She is receiving ASA.  Permissive hypertension has been practiced. The patient has been evaluated by PT/OT. Recommendation is for SNF. I have discussed this patient with Dr. Erlinda Hong. He recommends heparin drip in the am of 06/14/2019 and monitor her for bleed for 48 hours. If she has had no signs of bleeding, she may be started on Eliquis and discharged to SNF. CT head has been repeated on the morning of 06/16/2019. It  demonstrates expected evolution of a large right MCA vascular territory ischemic infarction with subtly diminished mass effect and unchanged trace leftward midline shift. There is also slight interval decrease in conspicutiy of her dense components of a subacute left occipital lobe parenchymal hemorrhage which continues to measure 2.2 cm. The surrounding edema has diminished.  Essential HTN: BP continues to be poorly controlled. I have changed hydralazine to per tube and increased frequency to qid from tid. I will add metoprolol bid. Monitor.  Hypokalemia: Resolved. Supplement and monitor.  Hyperthyroid: Pt is on methimazole. Stable.  Chronic Afib: Rate is controlled. Eliquis restarted at 2.5 mg bid. Add metoprolol for both rate control and antihypertensive.  Hx chronic diastolic chf: Euvolemic at this time. CXR demonstrates no pulonary edema or pulmonary vascular congestion. LVEF normal on 2d echo, reviewed. Will resume home dosage of lasix. Monitor volume status.  I have seen and examined this patient myself. I have spent 34 minutes in her evaluation and care.  DVT prophylaxis: Heparin subq Code Status: DNR Family Communication: None present. Disposition Plan: From SNF, anticipate d/c SNF blood pressures are controlled and bed is available.  Emilian Stawicki, DO [facility] Direct contact: see [email or web address]  7PM-7AM contact night coverage as above 06/16/2019, 1:23 PM  LOS: 5 days

## 2019-06-16 NOTE — Progress Notes (Signed)
ANTICOAGULATION CONSULT NOTE - Follow Up Consult  Pharmacy Consult for Heparin >> Apixaban Indication: Afib, CVA  Allergies  Allergen Reactions  . Morphine And Related Nausea And Vomiting    Patient Measurements: Weight: 144 lb (65.3 kg) Heparin Dosing Weight: 65 kg  Vital Signs: Temp: 97.9 F (36.6 C) (03/20 0415) Temp Source: Oral (03/20 0415) BP: 171/81 (03/20 0521) Pulse Rate: 76 (03/20 0521)  Labs: Recent Labs    06/14/19 0420 06/14/19 2026 06/15/19 0600 06/15/19 1437 06/16/19 0347  HGB 11.2*  --  10.6*  --  10.5*  HCT 34.7*  --  33.6*  --  32.5*  PLT 394  --  347  --  362  HEPARINUNFRC  --    < > 0.62 0.25* 0.20*  CREATININE 0.78  --  0.81  --   --    < > = values in this interval not displayed.    Estimated Creatinine Clearance: 41.1 mL/min (by C-G formula based on SCr of 0.81 mg/dL).   Assessment: 42 YOF with afib who was not on ant-coagulation prior to admission due to recent [place] 3 weeks ago, admitted now with new CVA.  To begin heparin drip at low dose  with plan to monitor for re-bleed for 48 hours before restarting po anti-coagulation.  A repeat CT was done earlier today and was stable. The plan is to transition from Heparin to Apixaban today. Given the patient's age>80, wt close to 60 kg, and recent ICH - the MD has opted to go with the lower dosing of Apixaban 2.5 mg bid.   Education was done with the patient's son Rush Landmark), paperwork placed in the discharge AVS.   Goal of Therapy:  Heparin level 0.3-0.5 units/ml Monitor platelets by anticoagulation protocol: Yes   Plan:  - D/c Heparin - Start Apixaban 2.5 mg bid - Education done with son, AVS paperwork in chart - Pharmacy will sign off and monitor peripherally  Thank you for allowing pharmacy to be a part of this patient's care.  Alycia Rossetti, PharmD, BCPS Clinical Pharmacist Clinical phone for 06/16/2019: [phone] 06/16/2019 7:52 AM   **Pharmacist phone directory can now be found on  [email or web address] ([email or web address]).  Listed under [place].

## 2019-06-17 LAB — CBC
HCT: 35.1 % — ABNORMAL LOW (ref 36.0–46.0)
Hemoglobin: 11.4 g/dL — ABNORMAL LOW (ref 12.0–15.0)
MCH: 27 pg (ref 26.0–34.0)
MCHC: 32.5 g/dL (ref 30.0–36.0)
MCV: 83 fL (ref 80.0–100.0)
Platelets: 378 10*3/uL (ref 150–400)
RBC: 4.23 MIL/uL (ref 3.87–5.11)
RDW: 21.7 % — ABNORMAL HIGH (ref 11.5–15.5)
WBC: 11.6 10*3/uL — ABNORMAL HIGH (ref 4.0–10.5)
nRBC: 0 % (ref 0.0–0.2)

## 2019-06-17 LAB — GLUCOSE, CAPILLARY
Glucose-Capillary: 118 mg/dL — ABNORMAL HIGH (ref 70–99)
Glucose-Capillary: 121 mg/dL — ABNORMAL HIGH (ref 70–99)
Glucose-Capillary: 122 mg/dL — ABNORMAL HIGH (ref 70–99)
Glucose-Capillary: 128 mg/dL — ABNORMAL HIGH (ref 70–99)
Glucose-Capillary: 130 mg/dL — ABNORMAL HIGH (ref 70–99)
Glucose-Capillary: 138 mg/dL — ABNORMAL HIGH (ref 70–99)

## 2019-06-17 NOTE — TOC Progression Note (Signed)
Transition of Care [facility]) - Progression Note    Patient Details  Name: [name] MRN: [id number] Date of Birth: [birth year]  [facility] of Care [facility]) CM/SW [place], [place] Phone Number: 06/17/2019, 3:08 PM  Clinical Narrative:    CSW contacted by MD and informed patient will be ready for discharge Monday. CSW spoke with April of [facility] and confirmed patient can discharge to facility Monday, pending covid test. No further questions reported at this time. CSW will continue to follow and assist with discharge planning needs.   Expected Discharge Plan: [place] Barriers to Discharge: Continued Medical Work up  Expected Discharge Plan and Services Expected Discharge Plan: [place] Choice: [place] Living arrangements for the past 2 months: Skilled Nursing Facility(from [facility])                                       Social Determinants of Health (SDOH) Interventions    Readmission Risk Interventions No flowsheet data found.

## 2019-06-17 NOTE — Social Work (Signed)
CSW received insurance authorization [id number], good 3/21 thru 3/24. Reference [id number]. Coordinator Alison Murray.  Criss Alvine, CSW

## 2019-06-17 NOTE — Progress Notes (Signed)
PROGRESS NOTE  [name] [id number] DOB: Jan 13, 1926 DOA: 06/08/2019 PCP: [name], MD  Brief History   86 y.o.femalewith medical history significant ofhypertension, hyperlipidemia,PPM, afiboff anticoagulationsand recent left occipital ICH 3 weeks ago presented from SNF to ER for new onset of slurred speech and right sided weakness. Pt was last see at her baseline about 8 AM this morning, then pt was found to have right sided gaze,left facial droop, slurred speech,lefthemiplegia. CODE STROKE triggered in ED. Pt was mumbling, hard to understand her talking, but she denied any pain, no SOB, she pointed to her throat and saying she can hardly clear her throat. CT showed evolving left ICH stable with the surrounding cerebral edema. No other ICH but right insular cortex ischemic attenuation. CT head and neck showed right superior branch of M2 decreased caliber with intermittent stenosis concerning fornonocclusive clot comparing with her CTA 3 weeks prior to admit.  Plan is for discharge to SNF. Recommendation from neurology is to place the patient on a heparin drip in the am of 06/14/2019 and monitor her for bleed for 48 hours. The patient was observed to have no neurological changes or other signs of bleeding during this period of time. Repeat CT head demonstrated only expected evolution of the stroke. She has been started on Eliquis 2.5 mg bid.  She is awaiting placement in SNF. Consultants  . Neurology  Antibiotics   Anti-infectives (From admission, onward)   None     Subjective  The patient is sitting up at bedside. No new complaints. Family is at bedside.  Objective   Vitals:  Vitals:   06/17/19 0805 06/17/19 1206  BP: (!) 153/59 (!) 139/49  Pulse: (!) 59 62  Resp: 20 20  Temp: 98.6 F (37 C) (!) 97.2 F (36.2 C)  SpO2: 100% 95%   Exam:  Constitutional:  . The patient is awake, alert, and oriented x 3. No acute distress. Respiratory:  . No  increased work of breathing. . No wheezes, rales, or rhonchi . No tactile fremitus Cardiovascular:  . Regular rate and rhythm . No murmurs, ectopy, or gallups. . No lateral PMI. No thrills. Abdomen:  . Abdomen is soft, non-tender, non-distended . No hernias, masses, or organomegaly . Normoactive bowel sounds.  Musculoskeletal:  . No cyanosis, clubbing, or edema Skin:  . No rashes, lesions, ulcers . palpation of skin: no induration or nodules Neurologic:  . CN 2-12 intact . Sensation all 4 extremities intact Psychiatric:  . Mental status o Mood, affect appropriate o Orientation to person, place, time  . judgment and insight appear intact  I have personally reviewed the following:   Today's Data  . Vitals, CBC  Scheduled Meds: . amLODipine  10 mg Per Tube q morning - 10a  . apixaban  2.5 mg Oral BID  . atorvastatin  40 mg Per Tube q1800  . Chlorhexidine Gluconate Cloth  6 each Topical Q0600  . feeding supplement (PRO-STAT SUGAR FREE 64)  30 mL Per Tube Daily  . free water  140 mL Per Tube QID  . furosemide  40 mg Intravenous Daily  . hydrALAZINE  25 mg Per Tube Q8H  . irbesartan  300 mg Per Tube q morning - 10a  . methimazole  5 mg Per Tube TID  . metoprolol tartrate  25 mg Oral BID  . pantoprazole sodium  40 mg Per Tube Daily   Continuous Infusions: . feeding supplement (OSMOLITE 1.2 CAL) 1,000 mL (06/16/19 1502)    Active  Problems:   Cerebral embolism with cerebral infarction   Cerebral infarction ([place])   LOS: 9 days   A & P  R MCA stroke: complicating this event is the patient's recent [place] prior to admit. She is also unable to undergo MRI given pacer. However, evolving CVA noted on CT head. Neurology was consulted and the patient has been admitted to a telemetry bed. She is receiving ASA.  Permissive hypertension has been practiced. The patient has been evaluated by PT/OT. Recommendation is for SNF. I have discussed this patient with Dr. Erlinda Hong. He recommends  heparin drip in the am of 06/14/2019 and monitor her for bleed for 48 hours. If she has had no signs of bleeding, she may be started on Eliquis and discharged to SNF. CT head has been repeated on the morning of 06/16/2019. It demonstrates expected evolution of a large right MCA vascular territory ischemic infarction with subtly diminished mass effect and unchanged trace leftward midline shift. There is also slight interval decrease in conspicutiy of her dense components of a subacute left occipital lobe parenchymal hemorrhage which continues to measure 2.2 cm. The surrounding edema has diminished.  Essential HTN: Stable on I have changed hydralazine 25 mg Q6 hours and Metoprolol 25 mg bid per tube. Monitor.  Hypokalemia: Resolved. Supplement and monitor.  Hyperthyroid: Pt is on methimazole. Stable.  Chronic Afib: Rate is controlled. Eliquis restarted at 2.5 mg bid. Add metoprolol for both rate control and antihypertensive.  Hx chronic diastolic chf: Euvolemic at this time. CXR demonstrates no pulonary edema or pulmonary vascular congestion. LVEF normal on 2d echo, reviewed. Will resume home dosage of lasix. Monitor volume status.  I have seen and examined this patient myself. I have spent 32 minutes in her evaluation and care.  DVT prophylaxis: Heparin subq Code Status: DNR Family Communication: None present. Disposition Plan: From SNF, anticipate d/c SNF when bed is available.  Adely Facer, DO [facility] Direct contact: see [email or web address]  7PM-7AM contact night coverage as above 06/17/2019, 3:02 PM  LOS: 5 days

## 2019-06-18 DIAGNOSIS — Z7901 Long term (current) use of anticoagulants: Secondary | ICD-10-CM

## 2019-06-18 DIAGNOSIS — R1310 Dysphagia, unspecified: Secondary | ICD-10-CM

## 2019-06-18 DIAGNOSIS — I5032 Chronic diastolic (congestive) heart failure: Secondary | ICD-10-CM

## 2019-06-18 LAB — CBC
HCT: 34.4 % — ABNORMAL LOW (ref 36.0–46.0)
Hemoglobin: 11.2 g/dL — ABNORMAL LOW (ref 12.0–15.0)
MCH: 27.4 pg (ref 26.0–34.0)
MCHC: 32.6 g/dL (ref 30.0–36.0)
MCV: 84.1 fL (ref 80.0–100.0)
Platelets: 383 10*3/uL (ref 150–400)
RBC: 4.09 MIL/uL (ref 3.87–5.11)
RDW: 21.7 % — ABNORMAL HIGH (ref 11.5–15.5)
WBC: 12.2 10*3/uL — ABNORMAL HIGH (ref 4.0–10.5)
nRBC: 0 % (ref 0.0–0.2)

## 2019-06-18 LAB — GLUCOSE, CAPILLARY
Glucose-Capillary: 100 mg/dL — ABNORMAL HIGH (ref 70–99)
Glucose-Capillary: 117 mg/dL — ABNORMAL HIGH (ref 70–99)
Glucose-Capillary: 122 mg/dL — ABNORMAL HIGH (ref 70–99)
Glucose-Capillary: 125 mg/dL — ABNORMAL HIGH (ref 70–99)
Glucose-Capillary: 141 mg/dL — ABNORMAL HIGH (ref 70–99)

## 2019-06-18 LAB — SARS CORONAVIRUS 2 BY RT PCR (DIASORIN): SARS Coronavirus 2: NEGATIVE

## 2019-06-18 MED ORDER — HYDRALAZINE HCL 25 MG PO TABS: 25.0000 mg | ORAL_TABLET | Freq: Three times a day (TID) | ORAL | 0 refills | Status: DC

## 2019-06-18 MED ORDER — IRBESARTAN 300 MG PO TABS: 300.0000 mg | ORAL_TABLET | Freq: Every morning | ORAL | 0 refills | Status: DC

## 2019-06-18 MED ORDER — ATORVASTATIN CALCIUM 40 MG PO TABS: 40.0000 mg | ORAL_TABLET | Freq: Every day | ORAL | 0 refills | Status: AC

## 2019-06-18 MED ORDER — HYDRALAZINE HCL 25 MG PO TABS: 25.0000 mg | ORAL_TABLET | Freq: Three times a day (TID) | ORAL | 0 refills | Status: AC

## 2019-06-18 MED ORDER — OSMOLITE 1.2 CAL PO LIQD: 1000.0000 mL | ORAL | 0 refills | Status: DC

## 2019-06-18 MED ORDER — APIXABAN 2.5 MG PO TABS: 2.5000 mg | ORAL_TABLET | Freq: Two times a day (BID) | ORAL | 0 refills | Status: AC

## 2019-06-18 MED ORDER — ATORVASTATIN CALCIUM 40 MG PO TABS: 40.0000 mg | ORAL_TABLET | Freq: Every day | ORAL | 0 refills | Status: DC

## 2019-06-18 MED ORDER — METOPROLOL TARTRATE 25 MG PO TABS: 25.0000 mg | ORAL_TABLET | Freq: Two times a day (BID) | ORAL | 0 refills | Status: AC

## 2019-06-18 MED ORDER — IRBESARTAN 300 MG PO TABS: 300.0000 mg | ORAL_TABLET | Freq: Every morning | ORAL | 0 refills | Status: AC

## 2019-06-18 MED ORDER — FREE WATER: 140.0000 mL | Freq: Four times a day (QID) | 0 refills | Status: AC

## 2019-06-18 MED ORDER — PRO-STAT SUGAR FREE PO LIQD: 30.0000 mL | Freq: Every day | ORAL | 0 refills | Status: AC

## 2019-06-18 NOTE — TOC Transition Note (Signed)
Transition of Care [facility]) - CM/SW Discharge Note   Patient Details  Name: [name] MRN: [id number] Date of Birth: 02-16-1926  [facility] CM/SW Contact:  Blima Ledger Phone Number: [phone] 06/18/2019, 3:32 PM   Clinical Narrative:     Nurse to call report to [phone]. Patient will be going to room 303  Final next level of care: Skilled Nursing Facility Barriers to Discharge: Barriers Resolved   Patient Goals and CMS Choice Patient states their goals for this hospitalization and ongoing recovery are:: To get back home CMS Medicare.gov Compare Post Acute Care list provided to:: Patient Choice offered to / list presented to : Patient, Adult Children  Discharge Placement              Patient chooses bed at: [facility], [place] Patient to be transferred to facility by: [place] Name of family member notified: Jadaisha Breeze Patient and family notified of of transfer: 06/18/19  Discharge Plan and Services     Post Acute Care Choice: Agency                               Social Determinants of Health (SDOH) Interventions     Readmission Risk Interventions No flowsheet data found.    Emeterio Reeve, Latanya Presser, [place] Licensed Holiday representative

## 2019-06-18 NOTE — Discharge Summary (Signed)
Physician Discharge Summary  [name] [id number] DOB: 03-Dec-1925 DOA: 06/08/2019  PCP: [name], MD  Admit date: 06/08/2019 Discharge date: 06/18/2019  Recommendations for Outpatient Follow-up:  1. Discharge to SNF 2. Follow up with PCP in 7-10 days 3. Follow up with neurology in 6 weeks  Follow-up Information    Garvin Fila, MD. Go on 07/17/2019.   Specialties: Neurology, Radiology Contact information: [address] [place] [address] [phone]          Discharge Diagnoses: Principal diagnosis is #1 1. Right MCA CVA 2. Hypertension 3. Hyperlipidemia 4. Hyperthyroidism 5. Chronic Atrial Fibrillation 6. Recent intracranial hemorrhage 7. Chronic Diastolic CHF 8. On NOAC for atrial fibrillation/secondary CVA prophylaxis 9. Poor PO intake  Discharge Condition: Fair  Disposition: SNF with PT/OT  Diet recommendation: DYS 1 (puree) diet with honey-thick liquids provided via teaspoon.  Filed Weights   06/13/19 0454 06/15/19 0339 06/16/19 0406  Weight: 66.2 kg 64.4 kg 65.3 kg    History of present illness:  [name] is a 86 y.o. female with medical history significant of hypertension, hyperlipidemia, PPM, afib off anticoagulations and recent left occipital ICH 3 weeks ago presented from SNF to ER for new onset of slurred speech and right sided weakness. Pt was last see at her baseline about 8 AM this morning, then pt was found to have right sided gaze, left facial droop, slurred speech, left hemiplegia. CODE STROKE triggered in ED. Pt was mumbling, hard to understand her talking, but she denied any pain, no SOB, she pointed to her throat and saying she can hardly clear her throat. ED Course: CT showed evolving left ICH stable with the surrounding cerebral edema. No other ICH but right insular cortex ischemic attenuation. CT head and neck showed right superior branch of M2 decreased caliber with intermittent stenosis  concerning fornonocclusive clot comparing with her CTA 3 weeks ago.  Hospital Course: [facility] were consutled to admit the patient for further evaluation and treatment. Neurology was consulted.  Pt was last see at her baseline about 8 AM this morning, then pt was found to have right sided gaze,left facial droop, slurred speech,lefthemiplegia. CODE STROKE triggered in ED. Pt was mumbling, hard to understand her talking, but she denied any pain, no SOB, she pointed to her throat and saying she can hardly clear her throat. CT showed evolving left ICH stable with the surrounding cerebral edema. No other ICH but right insular cortex ischemic attenuation. CT head and neck showed right superior branch of M2 decreased caliber with intermittent stenosis concerning fornonocclusive clot comparing with her CTA 3 weeks prior to admit.  Neurology was consulted. They recommended starting heparin drip on 06/14/2019 and monitoring the patient for 48 hours for neurological changes. This was stopped on 06/16/2019. Repeat CT head demonstrated only expected evolution of the stroke. It was stopped and the patient was converted to eliquis 2.5 mg bid. The patient has tolerated anticoagulation without isses.  Cortrak was placed due to the patient's dysphagia and her poor PO intake. However, the patient was re-evaluated on the morning of 06/18/2019. They have recommended discontinuation of the tube. This has been done.  Plan is for discharge to SNF. Recommendation from neurology is to place the patient on a heparin drip in the am of 06/14/2019 and monitor her for bleed for 48 hours. The patient was observed to have no neurological changes or other signs of bleeding during this period of time. Repeat CT head  demonstrated only expected evolution of the stroke. She has been started on Eliquis 2.5 mg bid.  She is appropriate for discharge to SNF today.  Today's assessment: S: The patient is resting quietly.  No new complaints. O: Vitals:  Vitals:   06/18/19 0409 06/18/19 0903  BP: (!) 154/67 (!) 144/75  Pulse: 61 76  Resp: 17 16  Temp: 97.7 F (36.5 C)   SpO2: 95% 94%   Exam:  Constitutional:   The patient is awake, alert, and oriented x 3. No acute distress. Respiratory:   No increased work of breathing.  No wheezes, rales, or rhonchi  No tactile fremitus Cardiovascular:   Regular rate and rhythm  No murmurs, ectopy, or gallups.  No lateral PMI. No thrills. Abdomen:   Abdomen is soft, non-tender, non-distended  No hernias, masses, or organomegaly  Normoactive bowel sounds.  Musculoskeletal:   No cyanosis, clubbing, or edema Skin:   No rashes, lesions, ulcers  palpation of skin: no induration or nodules Neurologic:   CN 2-12 intact  Sensation all 4 extremities intact Psychiatric:   Pt is awake and alert.   Discharge Instructions   Allergies as of 06/18/2019      Reactions   Morphine And Related Nausea And Vomiting      Medication List    STOP taking these medications   clonazePAM 0.5 MG tablet Commonly known as: KLONOPIN   Norco 5-325 MG tablet Generic drug: HYDROcodone-acetaminophen   PRESCRIPTION MEDICATION     TAKE these medications   acetaminophen 325 MG tablet Commonly known as: TYLENOL Take 650 mg by mouth every 6 (six) hours as needed for mild pain.   amLODipine 10 MG tablet Commonly known as: NORVASC Take 10 mg by mouth every morning.   apixaban 2.5 MG Tabs tablet Commonly known as: ELIQUIS Take 1 tablet (2.5 mg total) by mouth 2 (two) times daily.   atorvastatin 40 MG tablet Commonly known as: LIPITOR Take 1 tablet (40 mg total) by mouth daily at 6 PM.   docusate sodium 100 MG capsule Commonly known as: COLACE Take 100 mg by mouth 2 (two) times daily as needed for mild constipation.   feeding supplement (PRO-STAT SUGAR FREE 64) Liqd Place 30 mLs into feeding tube daily. Start taking on: June 19, 2019   ferrous  sulfate 325 (65 FE) MG tablet Take 325 mg by mouth 2 (two) times daily.   free water Soln Place 140 mLs into feeding tube 4 (four) times daily.   furosemide 20 MG tablet Commonly known as: LASIX Take 2 tablets (40 mg total) by mouth daily.   hydrALAZINE 25 MG tablet Commonly known as: APRESOLINE Take 1 tablet (25 mg total) by mouth every 8 (eight) hours. What changed: how much to take   irbesartan 300 MG tablet Commonly known as: AVAPRO Take 1 tablet (300 mg total) by mouth every morning. Start taking on: June 19, 2019   methimazole 5 MG tablet Commonly known as: TAPAZOLE Take 5 mg by mouth 3 (three) times daily.   metoCLOPramide 5 MG tablet Commonly known as: REGLAN Take 5 mg by mouth daily as needed for nausea.   metoprolol tartrate 25 MG tablet Commonly known as: LOPRESSOR Take 1 tablet (25 mg total) by mouth 2 (two) times daily.   omeprazole 20 MG capsule Commonly known as: PRILOSEC TAKE 1 CAPSULE (20 MG TOTAL) BY MOUTH DAILY. TAKE 30-60 MIN BEFORE DINNER What changed: when to take this   polyethylene glycol 17 g packet Commonly known  as: MIRALAX / GLYCOLAX Take 17 g by mouth daily as needed for mild constipation.   potassium chloride SA 20 MEQ tablet Commonly known as: KLOR-CON Take 20 mEq by mouth 2 (two) times daily.       Allergies  Allergen Reactions   Morphine And Related Nausea And Vomiting    The results of significant diagnostics from this hospitalization (including imaging, microbiology, ancillary and laboratory) are listed below for reference.    Significant Diagnostic Studies: CT ANGIO HEAD W OR WO CONTRAST  Result Date: 06/08/2019 CLINICAL DATA:  Left-sided weakness. EXAM: CT ANGIOGRAPHY HEAD AND NECK TECHNIQUE: Multidetector CT imaging of the head and neck was performed using the standard protocol during bolus administration of intravenous contrast. Multiplanar CT image reconstructions and MIPs were obtained to evaluate the vascular  anatomy. Carotid stenosis measurements (when applicable) are obtained utilizing NASCET criteria, using the distal internal carotid diameter as the denominator. CONTRAST:  35mL OMNIPAQUE IOHEXOL 350 MG/ML SOLN COMPARISON:  CT angiogram FINDINGS: CT HEAD FINDINGS Reported separately. CTA NECK FINDINGS Aortic arch: Standard branching. Imaged portion shows no evidence of aneurysm or dissection. Calcified plaques are seen in the aortic arch and at the origin of the major arch vessel without stenosis. Right carotid system: Normal course and caliber of the right common carotid artery. Calcified atheromatous plaques are seen in the right carotid bifurcation without hemodynamically significant stenosis. Increased tortuosity of the cervical segment of the right ACA without stenosis. Left carotid system: Normal course and caliber of the left common carotid artery. Calcified atherosclerotic plaque in the left carotid bifurcation without hemodynamically significant stenosis. Increased tortuosity of the cervical segment of the left ICA, without stenosis. Vertebral arteries: Codominant. No evidence of dissection, stenosis or occlusion. Skeleton: Degenerative changes of the cervical spine with multilevel neural foraminal narrowing. Degenerative changes of the sternoclavicular joints. No aggressive lesion identified. Other neck: Redemonstrated goiter and esophageal diverticulum. Upper chest: Negative Review of the MIP images confirms the above findings CTA HEAD FINDINGS Anterior circulation: Calcified plaques are seen in the bilateral carotid siphons, without hemodynamically significant stenosis. Short segment of attenuation of the distal right M1/MCA at the level of the bifurcation with non opacification of the origin of the superior division branch (series 5, image 118) consistent with occlusion at the origin of the superior division branch. Comparison with prior CT angiogram also demonstrate missing superior division branches.  The left MCA vascular tree in bilateral ACA vascular trees remain patent. Posterior circulation: Calcified plaque is seen at the left vertebral artery at the dural margin, without stenosis. The intracranial bilateral vertebral arteries and the basilar artery have normal course and caliber. Mild luminal irregularity throughout the bilateral PCAs, suggesting intracranial atherosclerotic disease. Venous sinuses: As permitted by contrast timing, patent. Anatomic variants: None Review of the MIP images confirms the above findings IMPRESSION: Short segment of attenuation of the distal right M1/MCA at the level of the bifurcation with non opacification of the origin of the superior division branch. Comparison with the prior CT angiogram also demonstrate missing superior division branches. I telephoned the critical test results to Dr. Erlinda Hong at 11:19 a.m. on 06/08/2019. Aortic Atherosclerosis (ICD10-I70.0). Electronically Signed   By: Pedro Earls M.D.   On: 06/08/2019 11:44   Chest 2 View  Result Date: 05/19/2019 CLINICAL DATA:  Headache. EXAM: CHEST - 2 VIEW COMPARISON:  October 18, 2018 FINDINGS: There is a left-sided single lead pacemaker in place. There is no pneumothorax. There may be small bilateral pleural effusions. The  heart size is enlarged. Aortic calcifications are noted. Bibasilar airspace disease is noted and favored to represent atelectasis. Vascular congestion is noted. There is no acute osseous abnormality. IMPRESSION: 1. Bibasilar airspace disease favored to represent atelectasis. Small bilateral pleural effusions. 2. Cardiomegaly with mild vascular congestion. 3. Left-sided pacemaker. Electronically Signed   By: Constance Holster M.D.   On: 05/19/2019 23:53   CT HEAD WO CONTRAST  Result Date: 06/16/2019 CLINICAL DATA:  Stroke, follow-up. EXAM: CT HEAD WITHOUT CONTRAST TECHNIQUE: Contiguous axial images were obtained from the base of the skull through the vertex without intravenous  contrast. COMPARISON:  Head CT 06/11/2019 FINDINGS: Brain: Expected evolution of a large right MCA vascular territory ischemic infarction involving the insula and right subinsular region as well as portions of the right frontal, parietal and temporal lobes. Mass effect appears subtly decreased. No evidence of hemorrhagic conversion. Unchanged trace leftward midline shift. Slight interval decrease in conspicuity of hyperdense components of a subacute left occipital lobe parenchymal hemorrhage again measuring up to 2.2 cm. Surrounding edema has slightly diminished. No new intracranial abnormality is identified. Vascular: No acute finding.  Atherosclerotic calcifications. Skull: Normal. Negative for fracture or focal lesion. Sinuses/Orbits: Visualized orbits demonstrate no acute abnormality. No significant paranasal sinus disease or mastoid effusion at the imaged levels. Partially visualized nasoenteric tube. IMPRESSION: Expected evolution of a large right MCA vascular territory ischemic infarction. Subtly diminished mass effect. Unchanged trace leftward midline shift. Slight interval decrease in conspicuity of hyperdense components of a subacute left occipital lobe parenchymal hemorrhage, again measuring 2.2 cm. Surrounding edema has also slightly diminished. No new intracranial abnormality. Electronically Signed   By: Kellie Simmering DO   On: 06/16/2019 10:10   CT HEAD WO CONTRAST  Result Date: 06/11/2019 CLINICAL DATA:  Stroke, follow-up EXAM: CT HEAD WITHOUT CONTRAST TECHNIQUE: Contiguous axial images were obtained from the base of the skull through the vertex without intravenous contrast. COMPARISON:  06/09/2019 FINDINGS: Brain: Hypoattenuation and loss of gray differentiation is again identified in the right MCA territory primarily involving frontoparietal lobes and insula. The area is more well-defined as expected. Left occipital hemorrhage is similar in size with stable surrounding edema. Regional mass effect  is unchanged including minor leftward midline shift. Ventricles are stable in size. There is no new hemorrhage or loss of gray-white differentiation. Vascular: No acute finding. Skull: Calvarium is unremarkable. Sinuses/Orbits: No acute finding. Other: None. IMPRESSION: Evolving right MCA territory infarction. Similar appearance of left occipital hemorrhage and surrounding edema. No new hemorrhage or worsening mass effect. Electronically Signed   By: Macy Mis M.D.   On: 06/11/2019 07:07   CT HEAD WO CONTRAST  Result Date: 06/09/2019 CLINICAL DATA:  Follow-up examination for acute stroke. EXAM: CT HEAD WITHOUT CONTRAST TECHNIQUE: Contiguous axial images were obtained from the base of the skull through the vertex without intravenous contrast. COMPARISON:  Prior CT from 06/08/2019. FINDINGS: Brain: Subacute hemorrhage involving the left occipital lobe again seen, little interval change in overall size measuring approximately 3 cm in diameter, although the hematoma is perhaps slightly less dense as compared to previous. Surrounding vasogenic edema without significant regional mass effect is relatively similar. No evidence for interval bleeding. Moderate to large area of evolving cytotoxic edema seen involving the right MCA distribution, with involvement of the right insula, subinsular white matter, and overlying supra ganglionic right frontoparietal region. No associated hemorrhage or significant mass effect. No other new intracranial hemorrhage. No other acute large vessel territory infarct. No hydrocephalus or extra-axial collection.  Vascular: No new hyperdense vessel. Skull: Scalp soft tissues and calvarium are stable and within normal limits. Sinuses/Orbits: Globes and orbital soft tissues demonstrate no acute finding. Paranasal sinuses are clear. No mastoid effusion. Other: None. IMPRESSION: 1. Evolving acute right MCA territory infarct. No associated hemorrhage or significant mass effect. 2. No  significant interval change in size of subacute left occipital hemorrhage with similar surrounding edema. 3. No other new acute intracranial abnormality. Electronically Signed   By: Jeannine Boga M.D.   On: 06/09/2019 05:25   CT HEAD WO CONTRAST  Result Date: 05/21/2019 CLINICAL DATA:  Cerebral hemorrhage EXAM: CT HEAD WITHOUT CONTRAST TECHNIQUE: Contiguous axial images were obtained from the base of the skull through the vertex without intravenous contrast. COMPARISON:  05/20/2019 FINDINGS: Brain: Left occipital high-density hemorrhage measures 31 x 21 mm, unchanged. Trace extension in the left occipital horn slightly improved. No new area of hemorrhage or mass. Ventricle size normal. No hydrocephalus. No acute ischemic infarct. Motion degraded study. Vascular: Negative for hyperdense vessel Skull: Negative Sinuses/Orbits: Paranasal sinuses clear. Bilateral cataract extraction. Other: None IMPRESSION: Stable hematoma left occipital lobe. Trace extension into the left occipital horn slightly improved. No hydrocephalus. No new hemorrhage Motion degraded study. Electronically Signed   By: Franchot Gallo M.D.   On: 05/21/2019 14:22   CT HEAD WO CONTRAST  Result Date: 05/20/2019 CLINICAL DATA:  86 year old female with acute left occipital hemorrhage yesterday. Sudden onset new nausea. EXAM: CT HEAD WITHOUT CONTRAST TECHNIQUE: Contiguous axial images were obtained from the base of the skull through the vertex without intravenous contrast. COMPARISON:  Head CT 05/19/2019. CTA head and neck 05/18/2019 and earlier. FINDINGS: Brain: Oval mildly lobulated left occipital pole intra-axial hemorrhage encompasses 34 x 24 x 28 mm (AP by transverse by CC), slightly larger than yesterday. Estimated blood volume now 11 mL, increased about 2 mL since yesterday. Regional edema and mild regional mass effect are stable. There is a trace amount of extension now into the left occipital horn on series 4, image 15. But no  other intraventricular blood identified. No subdural or subarachnoid hemorrhage identified. Stable ventricle size. Basilar cisterns remain patent. Stable gray-white matter differentiation elsewhere. Vascular: No suspicious intracranial vascular hyperdensity. Skull: No acute osseous abnormality identified. Sinuses/Orbits: Visualized paranasal sinuses and mastoids are clear. Other: No acute orbit or scalp soft tissue findings. IMPRESSION: 1. Left occipital intra-axial hemorrhage has increased by 1-2 mL since yesterday, now about 3.4 cm (11 mL). But regional edema and mild regional mass effect are stable. 2. And there is trace new intraventricular extension into the left occipital horn. But ventricle size remains normal. Electronically Signed   By: Genevie Ann M.D.   On: 05/20/2019 16:52   CT Head Wo Contrast  Result Date: 05/19/2019 CLINICAL DATA:  Headache. Evaluated yesterday, interval worsening. EXAM: CT HEAD WITHOUT CONTRAST TECHNIQUE: Contiguous axial images were obtained from the base of the skull through the vertex without intravenous contrast. COMPARISON:  CT and CTA 05/18/2019 FINDINGS: Brain: Intraparenchymal hemorrhage in the left occipital lobe measures 2.9 x 2.5 x 2.3 cm (volume = 8.7 cm^3). Retrospectively this was present on exam yesterday and measured 2.3 x 1.6 x 2.1 cm at that time, slight interval increase on the current exam. There is mild surrounding edema. No midline shift. No evidence of subarachnoid or subdural hemorrhage. Brain volume is normal for age. Mild chronic small vessel ischemia. No hydrocephalus. Vascular: Atherosclerosis of skullbase vasculature without hyperdense vessel or abnormal calcification. Skull: No fracture or focal lesion.  Sinuses/Orbits: Paranasal sinuses and mastoid air cells are clear. The visualized orbits are unremarkable. Bilateral cataract resection. Other: None. IMPRESSION: Left occipital intraparenchymal hemorrhage, 8.7cc volume, slightly increased in size from  exam yesterday. No midline shift. Critical Value/emergent results were called by telephone at the time of interpretation on 05/19/2019 at 10:30 pm to Dr Martinique Hunter, who verbally acknowledged these results. Electronically Signed   By: Keith Rake M.D.   On: 05/19/2019 22:31   CT ANGIO NECK W OR WO CONTRAST  Result Date: 06/08/2019 CLINICAL DATA:  Left-sided weakness. EXAM: CT ANGIOGRAPHY HEAD AND NECK TECHNIQUE: Multidetector CT imaging of the head and neck was performed using the standard protocol during bolus administration of intravenous contrast. Multiplanar CT image reconstructions and MIPs were obtained to evaluate the vascular anatomy. Carotid stenosis measurements (when applicable) are obtained utilizing NASCET criteria, using the distal internal carotid diameter as the denominator. CONTRAST:  50mL OMNIPAQUE IOHEXOL 350 MG/ML SOLN COMPARISON:  CT angiogram FINDINGS: CT HEAD FINDINGS Reported separately. CTA NECK FINDINGS Aortic arch: Standard branching. Imaged portion shows no evidence of aneurysm or dissection. Calcified plaques are seen in the aortic arch and at the origin of the major arch vessel without stenosis. Right carotid system: Normal course and caliber of the right common carotid artery. Calcified atheromatous plaques are seen in the right carotid bifurcation without hemodynamically significant stenosis. Increased tortuosity of the cervical segment of the right ACA without stenosis. Left carotid system: Normal course and caliber of the left common carotid artery. Calcified atherosclerotic plaque in the left carotid bifurcation without hemodynamically significant stenosis. Increased tortuosity of the cervical segment of the left ICA, without stenosis. Vertebral arteries: Codominant. No evidence of dissection, stenosis or occlusion. Skeleton: Degenerative changes of the cervical spine with multilevel neural foraminal narrowing. Degenerative changes of the sternoclavicular joints. No  aggressive lesion identified. Other neck: Redemonstrated goiter and esophageal diverticulum. Upper chest: Negative Review of the MIP images confirms the above findings CTA HEAD FINDINGS Anterior circulation: Calcified plaques are seen in the bilateral carotid siphons, without hemodynamically significant stenosis. Short segment of attenuation of the distal right M1/MCA at the level of the bifurcation with non opacification of the origin of the superior division branch (series 5, image 118) consistent with occlusion at the origin of the superior division branch. Comparison with prior CT angiogram also demonstrate missing superior division branches. The left MCA vascular tree in bilateral ACA vascular trees remain patent. Posterior circulation: Calcified plaque is seen at the left vertebral artery at the dural margin, without stenosis. The intracranial bilateral vertebral arteries and the basilar artery have normal course and caliber. Mild luminal irregularity throughout the bilateral PCAs, suggesting intracranial atherosclerotic disease. Venous sinuses: As permitted by contrast timing, patent. Anatomic variants: None Review of the MIP images confirms the above findings IMPRESSION: Short segment of attenuation of the distal right M1/MCA at the level of the bifurcation with non opacification of the origin of the superior division branch. Comparison with the prior CT angiogram also demonstrate missing superior division branches. I telephoned the critical test results to Dr. Erlinda Hong at 11:19 a.m. on 06/08/2019. Aortic Atherosclerosis (ICD10-I70.0). Electronically Signed   By: Pedro Earls M.D.   On: 06/08/2019 11:44   CT ABDOMEN PELVIS W CONTRAST  Result Date: 05/21/2019 CLINICAL DATA:  Acute abdominal pain. EXAM: CT ABDOMEN AND PELVIS WITH CONTRAST TECHNIQUE: Multidetector CT imaging of the abdomen and pelvis was performed using the standard protocol following bolus administration of intravenous contrast.  CONTRAST:  126mL  OMNIPAQUE IOHEXOL 300 MG/ML  SOLN COMPARISON:  10/16/2018. FINDINGS: Lower chest: Calcified granuloma in the right lower lobe. Atelectasis in the left lower lobe, adjacent to an elevated left hemidiaphragm. Heart is enlarged. Calcification of the aortic valve. No pericardial or pleural effusion. Distal esophagus is unremarkable. Hepatobiliary: Subcentimeter low-attenuation lesions in the liver are unchanged but too small to characterize. Cholecystectomy. No biliary ductal dilatation. Negative. Pancreas: Negative. Spleen: Negative. Adrenals/Urinary Tract: Adrenal glands are unremarkable. A 1.7 cm hyperdense lesion in the lower pole right kidney (3/39) may have enlarged slightly from 1.5 cm on 10/16/2026. A hyperdense lesion off the lower pole left kidney measures 3.5 cm (3/37), similar. A 2.4 cm exophytic low-attenuation lesion off the lower pole right kidney (3/38) is likely a cyst. Other low-attenuation lesions in the kidneys are too small to characterize. Ureters are decompressed. Bladder is grossly unremarkable although partially obscured by streak artifact from a right hip arthroplasty. Stomach/Bowel: Small hiatal hernia. Stomach is decompressed. Stomach, small bowel and colon are unremarkable. Appendix is not readily visualized. Vascular/Lymphatic: Atherosclerotic calcification of the aorta without aneurysm. No pathologically enlarged lymph nodes. Reproductive: Hysterectomy.  No adnexal mass. Other: No free fluid. An 11 mm right paramidline omental nodule (3/40) is unchanged. Mesenteries and peritoneum are otherwise unremarkable. Musculoskeletal: Right hip arthroplasty. Degenerative changes in the spine. No worrisome lytic or sclerotic lesions. IMPRESSION: 1. No acute findings to explain the patient's pain. 2. Hyperdense lesions in both kidneys cannot be characterized as simple cysts. Lesion on the right appears minimally larger. If further evaluation is desired, CT or MR without and with  contrast could be performed. 3. Isolated and nonspecific right paramidline omental nodule, unchanged. 4.  Aortic atherosclerosis (ICD10-I70.0). Electronically Signed   By: Lorin Picket M.D.   On: 05/21/2019 14:26   DG CHEST PORT 1 VIEW  Result Date: 06/11/2019 CLINICAL DATA:  CHF EXAM: PORTABLE CHEST 1 VIEW COMPARISON:  06/08/2019 FINDINGS: There is left basilar atelectasis. There is no focal consolidation. There is no pleural effusion or pneumothorax. The heart mediastinum are stable. There is a single lead cardiac pacemaker. There is no acute osseous abnormality. IMPRESSION: No acute cardiopulmonary disease. Mild left basilar atelectasis. Electronically Signed   By: Kathreen Devoid   On: 06/11/2019 15:30   DG CHEST PORT 1 VIEW  Result Date: 06/08/2019 CLINICAL DATA:  Congestive failure EXAM: PORTABLE CHEST 1 VIEW COMPARISON:  05/19/2019 FINDINGS: Cardiac shadow is mildly enlarged but stable. Pacing device is again seen. Aortic calcifications are noted. The lungs are well aerated bilaterally. No focal infiltrate or sizable effusion is seen. No bony abnormality is noted. IMPRESSION: No acute abnormality noted. Electronically Signed   By: Inez Catalina M.D.   On: 06/08/2019 17:44   DG Swallowing Func-Speech Pathology  Result Date: 06/14/2019 Objective Swallowing Evaluation: Type of Study: MBS-Modified Barium Swallow Study  Patient Details Name: LEONAH STOBBS MRN: [id number] Date of Birth: 26-Jun-1925 Today's Date: 06/14/2019 Time: SLP Start Time (ACUTE ONLY): 0800 -SLP Stop Time (ACUTE ONLY): 0820 SLP Time Calculation (min) (ACUTE ONLY): 20 min Past Medical History: Past Medical History: Diagnosis Date  Anxiety   Atrial fibrillation (HCC)   Bradycardia   Cancer ([place])   skin  Cataract   removed bilat with lens both eyes   Complication of anesthesia   Difficulty sleeping   Dysrhythmia   A FIB  GERD (gastroesophageal reflux disease)   H/O hiatal hernia   History of kidney stones   History of skin  cancer   History of  transfusion   Hyperlipidemia   Hypertension   Osteoarthritis   Pacemaker   Pneumonia   PONV (postoperative nausea and vomiting)   Shortness of breath   WITH EXERTION Past Surgical History: Past Surgical History: Procedure Laterality Date  APPENDECTOMY  1960  ARTHROPLASTY  1994  Left total knee  ARTHROPLASTY  2000  Total right knee  BACK SURGERY    CATARACT EXTRACTION, BILATERAL    with lens implants  CHOLECYSTECTOMY    COLONOSCOPY    DG SELECTED [facility]G [facility] ONLY  2002  Dilation  Microdiskectomy  05/2004  Left L4-L5  PACEMAKER INSERTION    POLYPECTOMY    TOTAL ABDOMINAL HYSTERECTOMY  1969  TOTAL HIP ARTHROPLASTY Right 12/24/2013  Procedure: RIGHT TOTAL HIP ARTHROPLASTY ANTERIOR APPROACH;  Surgeon: Gearlean Alf, MD;  Location: [facility];  Service: Orthopedics;  Laterality: Right;  UPPER GASTROINTESTINAL ENDOSCOPY   HPI: [name] is a 86 y.o. female with medical history significant of hypertension, hyperlipidemia, PPM, afib off anticoagulations and recent left occipital ICH 3 weeks ago presented from SNF to ER for new onset of slurred speech and right sided weakness. Head CT on 3/13 reported "Evolving acute right MCA territory infarct. No associated hemorrhage or significant mass effect." CXR was negative for acute abnormality.  Pt has baseline aphasia from previous CVA last month (04/2019).   Subjective: Pt was lethargic Assessment / Plan / Recommendation [facility]L IP CLINICAL IMPRESSIONS 06/14/2019 Clinical Impression Pts oral dysaphgia persists consistent with prior MBS, though ability to sustain arousal has improved. Left buccal and lingual weakenss result in struggle to form and propel bolus with pooling in the left buccal cavity. Pt senses residue and tilts head back to transit thicker bolus. There is significant premature spillage to the valleculae and pyriforms with aspiration (delayed sensation) before the swallow occuring with thin and nectar boluses. A body/head tilt  right improved oral control, as well and increasing viscocity to honey and controlling bolus size and placement into the right buccal cavity with a spoon. Pt did fatigue while trying to masticate and solids and residue found all around the teeth. Pt also found to have a small to moderate Zenker's Diverticulum that did not significantly impact safety with PO with only trace backflow to pharynx and emptied intermittently with swallowing and gravity. Recommend pt initiate a dys 1 (puree) diet with honey thick liquids via spoon with assist to position tilted right and to feed with bolus placement on the right. If pt tolerates diet over 1-2 days recommend Cortrak removal.  SLP Visit Diagnosis Dysphagia, oropharyngeal phase (R13.12) Attention and concentration deficit following -- Frontal lobe and executive function deficit following -- Impact on safety and function Moderate aspiration risk   CHL IP TREATMENT RECOMMENDATION 06/14/2019 Treatment Recommendations Therapy as outlined in treatment plan below   Prognosis 06/14/2019 Prognosis for Safe Diet Advancement Fair Barriers to Reach Goals -- Barriers/Prognosis Comment -- [facility]L IP DIET RECOMMENDATION 06/14/2019 SLP Diet Recommendations Dysphagia 1 (Puree) solids;Honey thick liquids Liquid Administration via Spoon Medication Administration Crushed with puree Compensations Slow rate Postural Changes Other (Comment)   [facility] IP OTHER RECOMMENDATIONS 06/14/2019 Recommended Consults -- Oral Care Recommendations Oral care before and after PO Other Recommendations Have oral suction available   [facility] IP FOLLOW UP RECOMMENDATIONS 06/14/2019 Follow up Recommendations Skilled Nursing facility   [facility] IP FREQUENCY AND DURATION 06/14/2019 Speech Therapy Frequency (ACUTE ONLY) min 2x/week Treatment Duration 2 weeks      CHL IP ORAL PHASE 06/14/2019 Oral Phase Impaired Oral -  Pudding Teaspoon -- Oral - Pudding Cup -- Oral - Honey Teaspoon Weak lingual manipulation;Reduced posterior propulsion;Right  pocketing in lateral sulci;Pocketing in anterior sulcus;Lingual/palatal residue;Delayed oral transit;Decreased bolus cohesion;Premature spillage Oral - Honey Cup -- Oral - Nectar Teaspoon Weak lingual manipulation;Reduced posterior propulsion;Right pocketing in lateral sulci;Pocketing in anterior sulcus;Lingual/palatal residue;Delayed oral transit;Decreased bolus cohesion;Premature spillage Oral - Nectar Cup Weak lingual manipulation;Reduced posterior propulsion;Right pocketing in lateral sulci;Pocketing in anterior sulcus;Lingual/palatal residue;Delayed oral transit;Decreased bolus cohesion;Premature spillage Oral - Nectar Straw Weak lingual manipulation;Reduced posterior propulsion;Right pocketing in lateral sulci;Pocketing in anterior sulcus;Lingual/palatal residue;Delayed oral transit;Decreased bolus cohesion;Premature spillage Oral - Thin Teaspoon NT Oral - Thin Cup Weak lingual manipulation;Reduced posterior propulsion;Right pocketing in lateral sulci;Pocketing in anterior sulcus;Lingual/palatal residue;Delayed oral transit;Decreased bolus cohesion;Premature spillage Oral - Thin Straw Weak lingual manipulation;Reduced posterior propulsion;Right pocketing in lateral sulci;Pocketing in anterior sulcus;Lingual/palatal residue;Delayed oral transit;Decreased bolus cohesion;Premature spillage Oral - Puree Weak lingual manipulation;Reduced posterior propulsion;Right pocketing in lateral sulci;Pocketing in anterior sulcus;Lingual/palatal residue;Delayed oral transit;Decreased bolus cohesion;Premature spillage Oral - Mech Soft Weak lingual manipulation;Reduced posterior propulsion;Right pocketing in lateral sulci;Pocketing in anterior sulcus;Lingual/palatal residue;Decreased bolus cohesion;Delayed oral transit;Impaired mastication Oral - Regular -- Oral - Multi-Consistency -- Oral - Pill -- Oral Phase - Comment --  CHL IP PHARYNGEAL PHASE 06/14/2019 Pharyngeal Phase Impaired Pharyngeal- Pudding Teaspoon -- Pharyngeal --  Pharyngeal- Pudding Cup -- Pharyngeal -- Pharyngeal- Honey Teaspoon Penetration/Aspiration before swallow;Pharyngeal residue - pyriform Pharyngeal Material enters airway, remains ABOVE vocal cords then ejected out;Material does not enter airway Pharyngeal- Honey Cup -- Pharyngeal -- Pharyngeal- Nectar Teaspoon Penetration/Aspiration before swallow;Trace aspiration;Pharyngeal residue - pyriform Pharyngeal Material enters airway, remains ABOVE vocal cords then ejected out;Material enters airway, passes BELOW cords then ejected out Pharyngeal- Nectar Cup Penetration/Aspiration before swallow;Trace aspiration;Pharyngeal residue - pyriform Pharyngeal Material enters airway, passes BELOW cords and not ejected out despite cough attempt by patient;Material enters airway, passes BELOW cords then ejected out;Material does not enter airway Pharyngeal- Nectar Straw Penetration/Aspiration before swallow;Trace aspiration;Pharyngeal residue - pyriform;Penetration/Apiration after swallow Pharyngeal Material enters airway, passes BELOW cords and not ejected out despite cough attempt by patient;Material does not enter airway Pharyngeal- Thin Teaspoon Penetration/Aspiration before swallow;Delayed swallow initiation-pyriform sinuses Pharyngeal Material enters airway, CONTACTS cords and then ejected out Pharyngeal- Thin Cup Penetration/Aspiration before swallow;Trace aspiration;Pharyngeal residue - pyriform Pharyngeal Material enters airway, CONTACTS cords and then ejected out;Material enters airway, passes BELOW cords and not ejected out despite cough attempt by patient Pharyngeal- Thin Straw Penetration/Aspiration before swallow;Trace aspiration Pharyngeal Material enters airway, passes BELOW cords then ejected out;Material enters airway, CONTACTS cords and then ejected out Pharyngeal- Puree Delayed swallow initiation-vallecula Pharyngeal Material does not enter airway Pharyngeal- Mechanical Soft Delayed swallow initiation-vallecula  Pharyngeal -- Pharyngeal- Regular -- Pharyngeal -- Pharyngeal- Multi-consistency -- Pharyngeal -- Pharyngeal- Pill -- Pharyngeal -- Pharyngeal Comment --  CHL IP CERVICAL ESOPHAGEAL PHASE 06/11/2019 Cervical Esophageal Phase WFL Pudding Teaspoon -- Pudding Cup -- Honey Teaspoon -- Honey Cup -- Nectar Teaspoon -- Nectar Cup -- Nectar Straw -- Thin Teaspoon -- Thin Cup -- Thin Straw -- Puree -- Mechanical Soft -- Regular -- Multi-consistency -- Pill -- Cervical Esophageal Comment -- Herbie Baltimore, MA CCC-SLP Acute Rehabilitation Services Pager [phone] Office [phone] Lynann Beaver 06/14/2019, 9:12 AM              DG Swallowing Func-Speech Pathology  Result Date: 06/11/2019 Objective Swallowing Evaluation: Type of Study: MBS-Modified Barium Swallow Study  Patient Details Name: [name] MRN: [id number] Date of Birth: Mar 28, 1926 Today's Date: 06/11/2019 Time: SLP  Start Time (ACUTE ONLY): 0805 -SLP Stop Time (ACUTE ONLY): 0825 SLP Time Calculation (min) (ACUTE ONLY): 20 min Past Medical History: Past Medical History: Diagnosis Date  Anxiety   Atrial fibrillation (HCC)   Bradycardia   Cancer ([place])   skin  Cataract   removed bilat with lens both eyes   Complication of anesthesia   Difficulty sleeping   Dysrhythmia   A FIB  GERD (gastroesophageal reflux disease)   H/O hiatal hernia   History of kidney stones   History of skin cancer   History of transfusion   Hyperlipidemia   Hypertension   Osteoarthritis   Pacemaker   Pneumonia   PONV (postoperative nausea and vomiting)   Shortness of breath   WITH EXERTION Past Surgical History: Past Surgical History: Procedure Laterality Date  APPENDECTOMY  1960  ARTHROPLASTY  1994  Left total knee  ARTHROPLASTY  2000  Total right knee  BACK SURGERY    CATARACT EXTRACTION, BILATERAL    with lens implants  CHOLECYSTECTOMY    COLONOSCOPY    DG SELECTED [facility] ONLY  2002  Dilation  Microdiskectomy  05/2004  Left L4-L5  PACEMAKER  INSERTION    POLYPECTOMY    TOTAL ABDOMINAL HYSTERECTOMY  1969  TOTAL HIP ARTHROPLASTY Right 12/24/2013  Procedure: RIGHT TOTAL HIP ARTHROPLASTY ANTERIOR APPROACH;  Surgeon: Gearlean Alf, MD;  Location: [facility];  Service: Orthopedics;  Laterality: Right;  UPPER GASTROINTESTINAL ENDOSCOPY   HPI: [name] is a 86 y.o. female with medical history significant of hypertension, hyperlipidemia, PPM, afib off anticoagulations and recent left occipital ICH 3 weeks ago presented from SNF to ER for new onset of slurred speech and right sided weakness. Head CT on 3/13 reported "Evolving acute right MCA territory infarct. No associated hemorrhage or significant mass effect." CXR was negative for acute abnormality.  Pt has baseline aphasia from previous CVA last month (04/2019).   Subjective: Pt was lethargic Assessment / Plan / Recommendation CHL IP CLINICAL IMPRESSIONS 06/11/2019 Clinical Impression Pt presents with oropharyngeal dysphagia across all consistencies. Oral phase is remarkable for weak lingual manipulation, oral holding, delayed AP transit, and reduced bolus cohesion resulting in premature spillage to the vallecula and the pyriform sinuses. With progression of the study, the pt became more and more lethargic, increasing oral holding, and required Max cues with a liquid wash to initiate swallow. Pharyngeal phase is remarkable for delayed swallow initiation at the pyriform sinuses  as well as reduced BOT retraction, epiglottic inversion and laryngeal closure resulting in penetration/aspiration and residue in the vallecula and pyriform sinuses. Mild aspiration occurred with thin liquids. Pt sensed aspiration producing a strong cough, but ultimately did not clear aspirate. Pt was very lethargic by the end of the study, and very difficult to arouse, despite Max cues. Given pt's inability to maintain appropriate alert state for the study and as noted in previous sessions, recommend continuing NPO, with meds  crushed in puree as the pt is alert/awake. SLP Visit Diagnosis Dysphagia, oropharyngeal phase (R13.12) Attention and concentration deficit following -- Frontal lobe and executive function deficit following -- Impact on safety and function Moderate aspiration risk   CHL IP TREATMENT RECOMMENDATION 06/11/2019 Treatment Recommendations Therapy as outlined in treatment plan below   Prognosis 06/11/2019 Prognosis for Safe Diet Advancement Fair Barriers to Reach Goals Language deficits;Cognitive deficits Barriers/Prognosis Comment -- [facility]L IP DIET RECOMMENDATION 06/11/2019 SLP Diet Recommendations NPO Liquid Administration via -- Medication Administration Crushed with puree Compensations -- Postural Changes --  CHL IP OTHER RECOMMENDATIONS 06/11/2019 Recommended Consults -- Oral Care Recommendations Oral care BID Other Recommendations Have oral suction available   [facility] IP FOLLOW UP RECOMMENDATIONS 06/11/2019 Follow up Recommendations Skilled Nursing facility   [facility] IP FREQUENCY AND DURATION 06/11/2019 Speech Therapy Frequency (ACUTE ONLY) min 2x/week Treatment Duration 2 weeks      CHL IP ORAL PHASE 06/11/2019 Oral Phase Impaired Oral - Pudding Teaspoon -- Oral - Pudding Cup -- Oral - Honey Teaspoon -- Oral - Honey Cup -- Oral - Nectar Teaspoon Premature spillage;Decreased bolus cohesion;Delayed oral transit;Impaired mastication;Weak lingual manipulation;Lingual/palatal residue Oral - Nectar Cup -- Oral - Nectar Straw Delayed oral transit;Decreased bolus cohesion;Premature spillage;Weak lingual manipulation;Lingual/palatal residue Oral - Thin Teaspoon Decreased bolus cohesion;Premature spillage;Delayed oral transit;Lingual/palatal residue;Weak lingual manipulation Oral - Thin Cup -- Oral - Thin Straw Premature spillage;Decreased bolus cohesion;Delayed oral transit;Lingual/palatal residue;Weak lingual manipulation Oral - Puree Delayed oral transit;Decreased bolus cohesion;Premature spillage;Lingual/palatal residue;Weak lingual  manipulation;Impaired mastication;Holding of bolus Oral - Mech Soft -- Oral - Regular -- Oral - Multi-Consistency -- Oral - Pill -- Oral Phase - Comment --  CHL IP PHARYNGEAL PHASE 06/11/2019 Pharyngeal Phase Impaired Pharyngeal- Pudding Teaspoon -- Pharyngeal -- Pharyngeal- Pudding Cup -- Pharyngeal -- Pharyngeal- Honey Teaspoon -- Pharyngeal -- Pharyngeal- Honey Cup -- Pharyngeal -- Pharyngeal- Nectar Teaspoon Delayed swallow initiation-vallecula;Pharyngeal residue - pyriform Pharyngeal -- Pharyngeal- Nectar Cup -- Pharyngeal -- Pharyngeal- Nectar Straw Penetration/Aspiration during swallow;Reduced airway/laryngeal closure;Reduced epiglottic inversion;Pharyngeal residue - valleculae Pharyngeal Material enters airway, remains ABOVE vocal cords then ejected out Pharyngeal- Thin Teaspoon Delayed swallow initiation-vallecula;Pharyngeal residue - pyriform Pharyngeal -- Pharyngeal- Thin Cup -- Pharyngeal -- Pharyngeal- Thin Straw Pharyngeal residue - pyriform;Penetration/Aspiration during swallow;Reduced airway/laryngeal closure;Delayed swallow initiation-vallecula;Trace aspiration Pharyngeal Material enters airway, passes BELOW cords and not ejected out despite cough attempt by patient Pharyngeal- Puree Penetration/Aspiration during swallow;Pharyngeal residue - valleculae;Reduced airway/laryngeal closure Pharyngeal Material enters airway, remains ABOVE vocal cords then ejected out Pharyngeal- Mechanical Soft -- Pharyngeal -- Pharyngeal- Regular -- Pharyngeal -- Pharyngeal- Multi-consistency -- Pharyngeal -- Pharyngeal- Pill -- Pharyngeal -- Pharyngeal Comment --  CHL IP CERVICAL ESOPHAGEAL PHASE 06/11/2019 Cervical Esophageal Phase WFL Pudding Teaspoon -- Pudding Cup -- Honey Teaspoon -- Honey Cup -- Nectar Teaspoon -- Nectar Cup -- Nectar Straw -- Thin Teaspoon -- Thin Cup -- Thin Straw -- Puree -- Mechanical Soft -- Regular -- Multi-consistency -- Pill -- Cervical Esophageal Comment -- Osie Bond., M.A. CCC-SLP Acute  Rehabilitation Services Pager [phone] Office [phone] 06/11/2019, 11:37 AM              ECHOCARDIOGRAM COMPLETE  Result Date: 05/20/2019    ECHOCARDIOGRAM REPORT   Patient Name:   [name] Date of Exam: 05/20/2019 Medical Rec #:  [id number]         Height:       63.0 in Accession #:    [id number]        Weight:       150.0 lb Date of Birth:  1925-07-10          BSA:          14.711 m Patient Age:    27 years          BP:           128/50 mmHg Patient Gender: F                 HR:           59 bpm. Exam Location:  [facility] Procedure: 2D Echo,  Cardiac Doppler and Color Doppler Indications:    Stroke 434.91 / I163.9  History:        Patient has prior history of Echocardiogram examinations, most                 recent 07/19/2018. Pacemaker, Arrythmias:Atrial Fibrillation;                 Risk Factors:Hypertension. Chronic anticoagulation,GERD, Cancer.  Sonographer:    Alvino Chapel RCS Referring Phys: [place]  1. Left ventricular ejection fraction, by estimation, is 60 to 65%. The left ventricle has normal function. The left ventricle has no regional wall motion abnormalities. Left ventricular diastolic parameters are indeterminate in the setting of atrial fibrillation.  2. Right ventricular systolic function is normal. The right ventricular size is normal. There is moderately elevated pulmonary artery systolic pressure. The estimated right ventricular systolic pressure is 123XX123 mmHg.  3. Left atrial size was severely dilated.  4. Right atrial size was moderately dilated.  5. The mitral valve is degenerative. Trivial mitral valve regurgitation.  6. The aortic valve is tricuspid. Aortic valve regurgitation is not visualized.  7. The inferior vena cava is dilated in size with >50% respiratory variability, suggesting right atrial pressure of 8 mmHg. FINDINGS  Left Ventricle: Left ventricular ejection fraction, by estimation, is 60 to 65%. The left ventricle has normal function. The  left ventricle has no regional wall motion abnormalities. The left ventricular internal cavity size was normal in size. There is  borderline left ventricular hypertrophy. Left ventricular diastolic parameters are indeterminate. Right Ventricle: The right ventricular size is normal. No increase in right ventricular wall thickness. Right ventricular systolic function is normal. There is moderately elevated pulmonary artery systolic pressure. The tricuspid regurgitant velocity is 3.00 m/s, and with an assumed right atrial pressure of 8 mmHg, the estimated right ventricular systolic pressure is 123XX123 mmHg. Left Atrium: Left atrial size was severely dilated. Right Atrium: Right atrial size was moderately dilated. Pericardium: There is no evidence of pericardial effusion. Mitral Valve: The mitral valve is degenerative in appearance. There is mild thickening of the mitral valve leaflet(s). Moderate mitral annular calcification. Trivial mitral valve regurgitation. Tricuspid Valve: The tricuspid valve is grossly normal. Tricuspid valve regurgitation is trivial. Aortic Valve: The aortic valve is tricuspid. Aortic valve regurgitation is not visualized. Mild aortic valve annular calcification. There is mild calcification of the aortic valve. Aortic valve mean gradient measures 10.0 mmHg. Aortic valve peak gradient  measures 17.8 mmHg. Aortic valve area, by VTI measures 1.96 cm. Pulmonic Valve: The pulmonic valve was grossly normal. Pulmonic valve regurgitation is trivial. Aorta: The aortic root is normal in size and structure. Venous: The inferior vena cava is dilated in size with greater than 50% respiratory variability, suggesting right atrial pressure of 8 mmHg. IAS/Shunts: No atrial level shunt detected by color flow Doppler. Additional Comments: A pacer wire is visualized.  LEFT VENTRICLE PLAX 2D LVIDd:         4.10 cm LVIDs:         2.50 cm LV PW:         0.90 cm LV IVS:        1.10 cm LVOT diam:     2.10 cm LV SV:          94.56 ml LV SV Index:   55.26 LVOT Area:     3.46 cm  LV Volumes (MOD) LV vol d, MOD A2C: 60.2 ml LV vol d, MOD A4C:  55.9 ml LV vol s, MOD A2C: 16.4 ml LV vol s, MOD A4C: 23.6 ml LV SV MOD A2C:     43.8 ml LV SV MOD A4C:     55.9 ml LV SV MOD BP:      38.5 ml RIGHT VENTRICLE TAPSE (M-mode): 1.9 cm LEFT ATRIUM              Index       RIGHT ATRIUM           Index LA diam:        4.00 cm  2.34 cm/m  RA Area:     25.10 cm LA Vol (A2C):   129.0 ml 75.39 ml/m RA Volume:   79.40 ml  46.40 ml/m LA Vol (A4C):   86.6 ml  50.61 ml/m LA Biplane Vol: 107.0 ml 62.53 ml/m  AORTIC VALVE AV Area (Vmax):    2.08 cm AV Area (Vmean):   2.03 cm AV Area (VTI):     1.96 cm AV Vmax:           211.00 cm/s AV Vmean:          146.000 cm/s AV VTI:            0.482 m AV Peak Grad:      17.8 mmHg AV Mean Grad:      10.0 mmHg LVOT Vmax:         127.00 cm/s LVOT Vmean:        85.500 cm/s LVOT VTI:          0.273 m LVOT/AV VTI ratio: 0.57  AORTA Ao Root diam: 3.10 cm MITRAL VALVE                TRICUSPID VALVE MV Area (PHT): 5.25 cm     TR Peak grad:   36.0 mmHg MV Decel Time: 145 msec     TR Vmax:        300.00 cm/s MV E velocity: 161.00 cm/s                             SHUNTS                             Systemic VTI:  0.27 m                             Systemic Diam: 2.10 cm Rozann Lesches MD Electronically signed by Rozann Lesches MD Signature Date/Time: 05/20/2019/2:54:19 PM    Final    DG HIP UNILAT WITH PELVIS 2-3 VIEWS RIGHT  Result Date: 05/20/2019 CLINICAL DATA:  Right hip pain after a fall. EXAM: DG HIP (WITH OR WITHOUT PELVIS) 2-3V RIGHT COMPARISON:  10/18/2018 FINDINGS: Previous total hip arthroplasty on the right. No complicating feature seen. No evidence of fracture. No other pelvic abnormal finding. IMPRESSION: Good appearance following previous right hip arthroplasty. No traumatic finding. Electronically Signed   By: Nelson Chimes M.D.   On: 05/20/2019 05:24   CT HEAD CODE STROKE WO CONTRAST  Result Date:  06/08/2019 CLINICAL DATA:  Code stroke. EXAM: CT HEAD WITHOUT CONTRAST TECHNIQUE: Contiguous axial images were obtained from the base of the skull through the vertex without intravenous contrast. COMPARISON:  Head CT obtained February 21st 2021. FINDINGS: Brain: New hypodensity involving the right insula, suggesting acute infarct. Redemonstrated subacute bleeding in the  left occipital lobe measuring 2.5 x 1.6 cm (prior 3.1 x 2.0 cm) with surrounding vasogenic edema. Resolution of the extension of the bleed into the occipital horn of the left lateral ventricle. No new hemorrhage identified. Vascular: No hyperdense vessel or unexpected calcification. Skull: Normal. Negative for fracture or focal lesion. Sinuses/Orbits: No acute finding. Other: None. ASPECTS [facility] Stroke Program Early CT Score) - Ganglionic level infarction (caudate, lentiform nuclei, internal capsule, insula, M1-M3 cortex): 5 - Supraganglionic infarction (M4-M6 cortex): 3 Total score (0-10 with 10 being normal): 9 IMPRESSION: 1. New hypodensity involving the right insula, suggesting acute infarct. 2. Redemonstrated is subacute bleeding in the left occipital lobe with surrounding vasogenic edema. Resolution of the extension of the bleed into the occipital horn of the left lateral ventricle. No new hemorrhage. 3. ASPECTS is 9 These results were called by telephone at the time of interpretation on 06/08/2019 at 11:00 am to provider Dr. Erlinda Hong, who verbally acknowledged these results. Electronically Signed   By: Pedro Earls M.D.   On: 06/08/2019 11:07   CT Angio Abd/Pel w/ and/or w/o  Result Date: 05/21/2019 CLINICAL DATA:  86 year old female with concern for mesenteric ischemia. EXAM: CTA ABDOMEN AND PELVIS WITHOUT AND WITH CONTRAST TECHNIQUE: Multidetector CT imaging of the abdomen and pelvis was performed using the standard protocol during bolus administration of intravenous contrast. Multiplanar reconstructed images and MIPs were  obtained and reviewed to evaluate the vascular anatomy. CONTRAST:  162mL OMNIPAQUE IOHEXOL 350 MG/ML SOLN COMPARISON:  CT abdomen pelvis dated 05/21/2019. FINDINGS: VASCULAR Aorta: Advanced atherosclerotic disease.  No aneurysm or dissection. Celiac: Patent without evidence of aneurysm, dissection, vasculitis or significant stenosis. SMA: There is advanced atherosclerotic calcification of the origin of the SMA. There is a focal area of approximately 50% luminal narrowing of the proximal SMA. The SMA remains patent. Renals: Atherosclerotic calcification of the origins of the renal arteries. The renal arteries are patent. IMA: Patent without evidence of aneurysm, dissection, vasculitis or significant stenosis. Inflow: Atherosclerotic calcification of the iliac arteries. The iliac arteries remain patent. Proximal Outflow: Bilateral common femoral and visualized portions of the superficial and profunda femoral arteries are patent without evidence of aneurysm, dissection, vasculitis or significant stenosis. Veins: No obvious venous abnormality within the limitations of this arterial phase study. Review of the MIP images confirms the above findings. NON-VASCULAR Lower chest: Diffuse interstitial coarsening. Left lung base atelectasis/scarring versus less likely pneumonia. Clinical correlation is recommended. There is cardiomegaly with biatrial dilatation. Coronary vascular calcification and partially visualized pacemaker wire. No intra-abdominal free air or free fluid. Hepatobiliary: Mildly irregular liver contour, likely early changes of cirrhosis. No intrahepatic biliary ductal dilatation. Cholecystectomy. No retained calcified stone in the central CBD. Pancreas: Unremarkable. No pancreatic ductal dilatation or surrounding inflammatory changes. Spleen: Normal in size without focal abnormality. Adrenals/Urinary Tract: Mild adrenal thickening/hyperplasia. There is no hydronephrosis on either side. There is symmetric  enhancement and excretion of contrast by both kidneys. Bilateral renal hypodense lesions are suboptimally characterized on this CT. These appears similar to the earlier CT. Ultrasound or may provide better characterization on a nonemergent basis. The visualized ureters appear unremarkable. Trabeculated appearance of the bladder wall, likely secondary to chronic bladder dysfunction. Stomach/Bowel: There is extensive sigmoid diverticulosis without active inflammatory changes. There is a small hiatal hernia. There is no bowel obstruction or active inflammation. Lymphatic: No adenopathy. Reproductive: Hysterectomy. Other: None Musculoskeletal: Right hip arthroplasty. No acute osseous pathology. Degenerative changes of the spine. IMPRESSION: 1. No acute intra-abdominal or pelvic  pathology. 2. Sigmoid diverticulosis. No bowel obstruction or active inflammation. 3. No pneumatosis, portal venous gas, or free air. 4. Additional findings as above including Aortic Atherosclerosis (ICD10-I70.0). Electronically Signed   By: Anner Crete M.D.   On: 05/21/2019 23:12    Microbiology: Recent Results (from the past 240 hour(s))  SARS CORONAVIRUS 2 (TAT 6-24 HRS) Nasopharyngeal Nasopharyngeal Swab     Status: None   Collection Time: 06/08/19 11:46 AM   Specimen: Nasopharyngeal Swab  Result Value Ref Range Status   SARS Coronavirus 2 NEGATIVE NEGATIVE Final    Comment: (NOTE) SARS-CoV-2 target nucleic acids are NOT DETECTED. The SARS-CoV-2 RNA is generally detectable in upper and lower respiratory specimens during the acute phase of infection. Negative results do not preclude SARS-CoV-2 infection, do not rule out co-infections with other pathogens, and should not be used as the sole basis for treatment or other patient management decisions. Negative results must be combined with clinical observations, patient history, and epidemiological information. The expected result is Negative. Fact Sheet for  Patients: SugarRoll.be Fact Sheet for Healthcare Providers: https://www.woods-mathews.com/ This test is not yet approved or cleared by the [place] FDA and  has been authorized for detection and/or diagnosis of SARS-CoV-2 by FDA under an Emergency Use Authorization (EUA). This EUA will remain  in effect (meaning this test can be used) for the duration of the COVID-19 declaration under Section 56 4(b)(1) of the Act, 21 U.S.C. section 360bbb-3(b)(1), unless the authorization is terminated or revoked sooner. Performed at [facility], [place] [address] [place], [place] [address]      Labs: Basic Metabolic Panel: Recent Labs  Lab 06/13/19 0345 06/14/19 0420 06/15/19 0600  NA 135 136 136  K 3.2* 3.3* 3.8  CL 99 96* 95*  CO2 24 28 29   GLUCOSE 139* 150* 134*  BUN 20 23 31*  CREATININE 0.77 0.78 0.81  CALCIUM 8.9 9.1 8.9   Liver Function Tests: Recent Labs  Lab 06/13/19 0345 06/14/19 0420  AST 25 23  ALT 18 18  ALKPHOS 80 83  BILITOT 0.4 0.8  PROT 5.9* 5.8*  ALBUMIN 3.1* 2.9*   No results for input(s): LIPASE, AMYLASE in the last 168 hours. No results for input(s): AMMONIA in the last 168 hours. CBC: Recent Labs  Lab 06/14/19 0420 06/15/19 0600 06/16/19 0347 06/17/19 0510 06/18/19 0310  WBC 12.6* 11.4* 11.0* 11.6* 12.2*  NEUTROABS 10.7*  --   --   --   --   HGB 11.2* 10.6* 10.5* 11.4* 11.2*  HCT 34.7* 33.6* 32.5* 35.1* 34.4*  MCV 83.8 85.1 83.3 83.0 84.1  PLT 394 347 362 378 383   Cardiac Enzymes: No results for input(s): CKTOTAL, CKMB, CKMBINDEX, TROPONINI in the last 168 hours. BNP: BNP (last 3 results) Recent Labs    08/09/18 0114  BNP 309.9*    ProBNP (last 3 results) No results for input(s): PROBNP in the last 8760 hours.  CBG: Recent Labs  Lab 06/17/19 1529 06/17/19 2046 06/18/19 0034 06/18/19 0434 06/18/19 0751  GLUCAP 121* 128* 141* 122* 117*    Active Problems:   Cerebral embolism  with cerebral infarction   Cerebral infarction [facility])   Time coordinating discharge: 38 minutes.  Signed:        Antonisha Waskey, DO [facility]  06/18/2019, 11:36 AM

## 2019-06-18 NOTE — Progress Notes (Signed)
Report given to RN at [facility]. Educated patient and son on discharge instructions, belongings returned to patient. Transportation provided by [employer].

## 2019-06-18 NOTE — Progress Notes (Signed)
Physical Therapy Treatment Patient Details Name: [name] MRN: [id number] DOB: 1925-05-30 Today's Date: 06/18/2019    History of Present Illness 86 y.o. female with PMH of hypertension, hyperlipidemia, pacer for bradycardia, afib currently not on AC and recent left occipital ICH 3 weeks ago presented from SNF to ER for acute onset right gaze, left facial droop, slurred speech, right hemiplegia. LSW 8:30 am. CT showed evolving left ICH stable with the surrounding cerebral edema. CT head and neck showed right superior branch of M2 decreased caliber with intermittent stenosis concerning for nonocclusive clot comparing with her CTA 3 weeks ago.    PT Comments    Pt supine in bed presents with lethargy and fatigued with activity.  Pt continues to require assistance to move from bed to commode and back to bed with moderate assistance.  She presents with poor balance and continues to benefit from skilled rehab in a post acute setting.     Follow Up Recommendations  SNF;Supervision/Assistance - 24 hour     Equipment Recommendations  (defer to post acute setting.)    Recommendations for Other Services       Precautions / Restrictions Precautions Precautions: Fall Precaution Comments: Right side weakness - 3 weeks ago.  Now left side weakness Restrictions Weight Bearing Restrictions: No Other Position/Activity Restrictions: No dizziness this session    Mobility  Bed Mobility Overal bed mobility: Needs Assistance Bed Mobility: Rolling;Sidelying to Sit Rolling: Mod assist Sidelying to sit: Mod assist   Sit to supine: Mod assist;+2 for physical assistance   General bed mobility comments: Pt required assistance to move to edge of bed and elevate trunk into a seated position.  Pt required assistance to move back to bed and boost to [facility].  Transfers Overall transfer level: Needs assistance Equipment used: Rolling walker (2 wheeled);None Transfers: Sit to/from Stand Sit to Stand:  Mod assist Stand pivot transfers: Mod assist       General transfer comment: Pt performed sit to stand and stand pivot from bed to commode with face to face transfer.  Post toileting utilized RW for standing and able to progress steps back toward bed.  Ambulation/Gait Ambulation/Gait assistance: Mod assist Gait Distance (Feet): 4 Feet Assistive device: Rolling walker (2 wheeled) Gait Pattern/deviations: Step-through pattern;Shuffle;Trunk flexed;Narrow base of support Gait velocity: reduced   General Gait Details: Short shuffling steps from bedside commode back to recliner.   Stairs             Wheelchair Mobility    Modified Rankin (Stroke Patients Only) Modified Rankin (Stroke Patients Only) Pre-Morbid Rankin Score: Moderately severe disability Modified Rankin: Moderately severe disability     Balance Overall balance assessment: Needs assistance Sitting-balance support: Bilateral upper extremity supported;Feet supported Sitting balance-Leahy Scale: Fair Sitting balance - Comments: able to scoot forward once seated EOB. Min guard for safety. Postural control: Posterior lean   Standing balance-Leahy Scale: Poor Standing balance comment: minA with BUE support of RW                            Cognition Arousal/Alertness: Awake/alert Behavior During Therapy: WFL for tasks assessed/performed Overall Cognitive Status: Within Functional Limits for tasks assessed Area of Impairment: Memory;Following commands;Safety/judgement;Awareness;Problem solving                     Memory: Decreased recall of precautions;Decreased short-term memory Following Commands: Follows one step commands inconsistently;Follows one step commands with increased time Safety/Judgement: Decreased  awareness of safety;Decreased awareness of deficits Awareness: Intellectual Problem Solving: Slow processing;Requires verbal cues General Comments: hard of hearing      Exercises       General Comments        Pertinent Vitals/Pain Pain Assessment: Faces Faces Pain Scale: Hurts even more Pain Location: L groin Pain Descriptors / Indicators: Aching Pain Intervention(s): Monitored during session;Repositioned    Home Living                      Prior Function            PT Goals (current goals can now be found in the care plan section) Acute Rehab PT Goals Patient Stated Goal: To improve mobility Potential to Achieve Goals: Fair Progress towards PT goals: Progressing toward goals    Frequency    Min 3X/week      PT Plan Current plan remains appropriate    Co-evaluation              AM-PAC PT "6 Clicks" Mobility   Outcome Measure  Help needed turning from your back to your side while in a flat bed without using bedrails?: A Lot Help needed moving from lying on your back to sitting on the side of a flat bed without using bedrails?: A Lot Help needed moving to and from a bed to a chair (including a wheelchair)?: A Lot Help needed standing up from a chair using your arms (e.g., wheelchair or bedside chair)?: A Lot Help needed to walk in hospital room?: A Lot Help needed climbing 3-5 steps with a railing? : Total 6 Click Score: 11    End of Session Equipment Utilized During Treatment: Gait belt Activity Tolerance: Patient limited by fatigue Patient left: with family/visitor present;in bed;with bed alarm set(in chair position) Nurse Communication: Mobility status PT Visit Diagnosis: Other abnormalities of gait and mobility (R26.89);History of falling (Z91.81);Muscle weakness (generalized) (M62.81);Difficulty in walking, not elsewhere classified (R26.2);Hemiplegia and hemiparesis Hemiplegia - Right/Left: Left Hemiplegia - dominant/non-dominant: Non-dominant Hemiplegia - caused by: Cerebral infarction     Time: [id number] PT Time Calculation (min) (ACUTE ONLY): 17 min  Charges:  $Therapeutic Activity: 8-22 mins                      Erasmo Leventhal , PTA Acute Rehabilitation Services Pager [phone] Office [phone]     [name] 06/18/2019, 4:21 PM

## 2019-06-18 NOTE — Progress Notes (Signed)
  Speech Language Pathology Treatment: Dysphagia  Patient Details Name: [name] MRN: [id number] DOB: [birth year] Today's Date: 06/18/2019 Time: [id number] SLP Time Calculation (min) (ACUTE ONLY): 20 min  Assessment / Plan / Recommendation Clinical Impression  SLP fed pt am meal, pt able to consistently use head/body tilt right strategy with one initial cue. She tolerated honey thick liquids via spoon and creamy purees very well. The thicker purees, like eggs took extra time for oral transit with verbal cues needed at time to regain pts attention to task. The thinner puree, like cream of wheat, resulted in some coughing. Overall, pt tolerating POs very well with about 20% intake. Discussed with MD. Recommend NG tube removal as pt is consistently accepting oral intake when assistance for feeding is given.   HPI HPI: [name] is a 86 y.o. female with medical history significant of hypertension, hyperlipidemia, PPM, afib off anticoagulations and recent left occipital ICH 3 weeks ago presented from SNF to ER for new onset of slurred speech and right sided weakness. Head CT on 3/13 reported "Evolving acute right MCA territory infarct. No associated hemorrhage or significant mass effect." CXR was negative for acute abnormality.  Pt has baseline aphasia from previous CVA last month (04/2019).        SLP Plan  Continue with current plan of care       Recommendations  Diet recommendations: Dysphagia 1 (puree);Honey-thick liquid Liquids provided via: Teaspoon Medication Administration: Crushed with puree Supervision: Full supervision/cueing for compensatory strategies;Trained caregiver to feed patient Compensations: Slow rate;Small sips/bites Postural Changes and/or Swallow Maneuvers: Head tilt right during swallow                Oral Care Recommendations: Oral care QID Follow up Recommendations: Skilled Nursing facility SLP Visit Diagnosis: Dysphagia, oropharyngeal phase  (R13.12) Plan: Continue with current plan of care       GO               Herbie Baltimore, MA [place] Pager [phone] Office [phone]  Lynann Beaver 06/18/2019, 11:09 AM

## 2019-07-17 ENCOUNTER — Inpatient Hospital Stay: Payer: Self-pay | Admitting: Neurology

## 2019-07-26 ENCOUNTER — Ambulatory Visit: Payer: Medicare Other | Admitting: *Deleted

## 2019-07-28 DEATH — deceased
